# Patient Record
Sex: Female | Born: 1955 | Race: Black or African American | Hispanic: No | Marital: Single | Smoking: Never smoker
Health system: Southern US, Community
[De-identification: ages and names within clinical notes are randomized; demographics above are authoritative.]

## PROBLEM LIST (undated history)

## (undated) DIAGNOSIS — F419 Anxiety disorder, unspecified: Secondary | ICD-10-CM

## (undated) DIAGNOSIS — I5022 Chronic systolic (congestive) heart failure: Secondary | ICD-10-CM

## (undated) DIAGNOSIS — E7800 Pure hypercholesterolemia, unspecified: Secondary | ICD-10-CM

## (undated) DIAGNOSIS — I639 Cerebral infarction, unspecified: Secondary | ICD-10-CM

## (undated) DIAGNOSIS — Z9289 Personal history of other medical treatment: Secondary | ICD-10-CM

## (undated) DIAGNOSIS — I442 Atrioventricular block, complete: Secondary | ICD-10-CM

## (undated) DIAGNOSIS — G4730 Sleep apnea, unspecified: Secondary | ICD-10-CM

## (undated) DIAGNOSIS — U071 COVID-19: Secondary | ICD-10-CM

## (undated) DIAGNOSIS — E669 Obesity, unspecified: Secondary | ICD-10-CM

## (undated) DIAGNOSIS — I10 Essential (primary) hypertension: Secondary | ICD-10-CM

## (undated) DIAGNOSIS — E049 Nontoxic goiter, unspecified: Secondary | ICD-10-CM

## (undated) DIAGNOSIS — J45998 Other asthma: Secondary | ICD-10-CM

## (undated) DIAGNOSIS — Z9221 Personal history of antineoplastic chemotherapy: Secondary | ICD-10-CM

## (undated) DIAGNOSIS — Z923 Personal history of irradiation: Secondary | ICD-10-CM

## (undated) DIAGNOSIS — I255 Ischemic cardiomyopathy: Secondary | ICD-10-CM

## (undated) DIAGNOSIS — D649 Anemia, unspecified: Secondary | ICD-10-CM

## (undated) DIAGNOSIS — D497 Neoplasm of unspecified behavior of endocrine glands and other parts of nervous system: Secondary | ICD-10-CM

## (undated) DIAGNOSIS — C50411 Malignant neoplasm of upper-outer quadrant of right female breast: Secondary | ICD-10-CM

## (undated) DIAGNOSIS — D329 Benign neoplasm of meninges, unspecified: Secondary | ICD-10-CM

## (undated) DIAGNOSIS — M79606 Pain in leg, unspecified: Secondary | ICD-10-CM

## (undated) DIAGNOSIS — I2510 Atherosclerotic heart disease of native coronary artery without angina pectoris: Secondary | ICD-10-CM

## (undated) HISTORY — DX: Malignant neoplasm of upper-outer quadrant of right female breast: C50.411

## (undated) HISTORY — PX: REDUCTION MAMMAPLASTY: SUR839

## (undated) HISTORY — PX: PITUITARY SURGERY: SHX203

## (undated) HISTORY — DX: Chronic systolic (congestive) heart failure: I50.22

## (undated) HISTORY — DX: Pain in leg, unspecified: M79.606

## (undated) HISTORY — PX: BREAST LUMPECTOMY: SHX2

## (undated) HISTORY — PX: ABDOMINAL HYSTERECTOMY: SHX81

## (undated) HISTORY — DX: Other asthma: J45.998

## (undated) HISTORY — DX: Personal history of other medical treatment: Z92.89

## (undated) HISTORY — DX: Personal history of irradiation: Z92.3

## (undated) HISTORY — PX: THYROID SURGERY: SHX805

## (undated) HISTORY — PX: BREAST BIOPSY: SHX20

## (undated) SURGERY — BREAST REDUCTION WITH LIPOSUCTION
Anesthesia: General | Laterality: Bilateral

---

## 1997-12-08 ENCOUNTER — Encounter: Admission: RE | Admit: 1997-12-08 | Discharge: 1998-03-08 | Payer: Self-pay | Admitting: Family Medicine

## 2003-04-25 DIAGNOSIS — E049 Nontoxic goiter, unspecified: Secondary | ICD-10-CM

## 2003-04-25 HISTORY — DX: Nontoxic goiter, unspecified: E04.9

## 2003-04-25 HISTORY — PX: BRAIN MENINGIOMA EXCISION: SHX576

## 2008-03-06 ENCOUNTER — Encounter: Admission: RE | Admit: 2008-03-06 | Discharge: 2008-03-06 | Payer: Self-pay | Admitting: Neurosurgery

## 2008-12-16 ENCOUNTER — Other Ambulatory Visit: Admission: RE | Admit: 2008-12-16 | Discharge: 2008-12-16 | Payer: Self-pay | Admitting: Family Medicine

## 2009-02-22 ENCOUNTER — Encounter: Admission: RE | Admit: 2009-02-22 | Discharge: 2009-04-21 | Payer: Self-pay | Admitting: Family Medicine

## 2009-09-21 ENCOUNTER — Emergency Department (HOSPITAL_COMMUNITY): Admission: EM | Admit: 2009-09-21 | Discharge: 2009-09-21 | Payer: Self-pay | Admitting: Emergency Medicine

## 2010-03-27 ENCOUNTER — Inpatient Hospital Stay (HOSPITAL_COMMUNITY)
Admission: EM | Admit: 2010-03-27 | Discharge: 2010-03-29 | Payer: Self-pay | Source: Home / Self Care | Attending: Internal Medicine | Admitting: Internal Medicine

## 2010-03-28 ENCOUNTER — Encounter (INDEPENDENT_AMBULATORY_CARE_PROVIDER_SITE_OTHER): Payer: Self-pay | Admitting: Internal Medicine

## 2010-05-15 ENCOUNTER — Encounter: Payer: Self-pay | Admitting: Family Medicine

## 2010-07-04 ENCOUNTER — Emergency Department (HOSPITAL_COMMUNITY): Payer: 59

## 2010-07-04 ENCOUNTER — Emergency Department (HOSPITAL_COMMUNITY)
Admission: EM | Admit: 2010-07-04 | Discharge: 2010-07-04 | Disposition: A | Discharge status: Home / Self Care | Payer: 59 | Attending: Emergency Medicine | Admitting: Emergency Medicine

## 2010-07-04 DIAGNOSIS — Z794 Long term (current) use of insulin: Secondary | ICD-10-CM | POA: Insufficient documentation

## 2010-07-04 DIAGNOSIS — R05 Cough: Secondary | ICD-10-CM | POA: Insufficient documentation

## 2010-07-04 DIAGNOSIS — R059 Cough, unspecified: Secondary | ICD-10-CM | POA: Insufficient documentation

## 2010-07-04 DIAGNOSIS — J40 Bronchitis, not specified as acute or chronic: Secondary | ICD-10-CM | POA: Insufficient documentation

## 2010-07-04 DIAGNOSIS — I10 Essential (primary) hypertension: Secondary | ICD-10-CM | POA: Insufficient documentation

## 2010-07-04 DIAGNOSIS — E119 Type 2 diabetes mellitus without complications: Secondary | ICD-10-CM | POA: Insufficient documentation

## 2010-07-04 DIAGNOSIS — R062 Wheezing: Secondary | ICD-10-CM | POA: Insufficient documentation

## 2010-07-05 LAB — GLUCOSE, CAPILLARY
Glucose-Capillary: 177 mg/dL — ABNORMAL HIGH (ref 70–99)
Glucose-Capillary: 200 mg/dL — ABNORMAL HIGH (ref 70–99)
Glucose-Capillary: 213 mg/dL — ABNORMAL HIGH (ref 70–99)
Glucose-Capillary: 291 mg/dL — ABNORMAL HIGH (ref 70–99)

## 2010-07-05 LAB — CBC
HCT: 39.4 % (ref 36.0–46.0)
Hemoglobin: 13.7 g/dL (ref 12.0–15.0)
Hemoglobin: 13.8 g/dL (ref 12.0–15.0)
MCH: 29.8 pg (ref 26.0–34.0)
MCH: 30.1 pg (ref 26.0–34.0)
MCHC: 32.9 g/dL (ref 30.0–36.0)
MCV: 91 fL (ref 78.0–100.0)
Platelets: 286 10*3/uL (ref 150–400)
Platelets: 287 10*3/uL (ref 150–400)
RBC: 4.33 MIL/uL (ref 3.87–5.11)
RBC: 4.58 MIL/uL (ref 3.87–5.11)
RDW: 13.8 % (ref 11.5–15.5)
WBC: 8 10*3/uL (ref 4.0–10.5)
WBC: 8 10*3/uL (ref 4.0–10.5)

## 2010-07-05 LAB — COMPREHENSIVE METABOLIC PANEL
ALT: 20 U/L (ref 0–35)
AST: 17 U/L (ref 0–37)
Albumin: 3.2 g/dL — ABNORMAL LOW (ref 3.5–5.2)
Alkaline Phosphatase: 65 U/L (ref 39–117)
BUN: 11 mg/dL (ref 6–23)
CO2: 32 mEq/L (ref 19–32)
Calcium: 8.8 mg/dL (ref 8.4–10.5)
Chloride: 103 mEq/L (ref 96–112)
Creatinine, Ser: 0.86 mg/dL (ref 0.4–1.2)
GFR calc Af Amer: >60 mL/min (ref >60)
Glucose, Bld: 151 mg/dL — ABNORMAL HIGH (ref 70–99)
Potassium: 3.7 mEq/L (ref 3.5–5.1)
Sodium: 140 mEq/L (ref 135–145)
Total Bilirubin: 0.3 mg/dL (ref 0.3–1.2)
Total Protein: 6.6 g/dL (ref 6.0–8.3)

## 2010-07-05 LAB — HEPATIC FUNCTION PANEL
Alkaline Phosphatase: 66 U/L (ref 39–117)
Indirect Bilirubin: 0.1 mg/dL — ABNORMAL LOW (ref 0.3–0.9)
Total Bilirubin: 0.2 mg/dL — ABNORMAL LOW (ref 0.3–1.2)
Total Protein: 6.9 g/dL (ref 6.0–8.3)

## 2010-07-05 LAB — DIFFERENTIAL
Eosinophils Absolute: 0.3 10*3/uL (ref 0.0–0.7)
Eosinophils Absolute: 0.3 10*3/uL (ref 0.0–0.7)
Eosinophils Relative: 5 % (ref 0–5)
Lymphocytes Relative: 43 % (ref 12–46)
Lymphocytes Relative: 46 % (ref 12–46)
Lymphs Abs: 2.8 10*3/uL (ref 0.7–4.0)
Lymphs Abs: 3.5 10*3/uL (ref 0.7–4.0)
Monocytes Relative: 7 % (ref 3–12)
Monocytes Relative: 8 % (ref 3–12)
Neutro Abs: 3.6 10*3/uL (ref 1.7–7.7)
Neutrophils Relative %: 45 % (ref 43–77)

## 2010-07-05 LAB — BASIC METABOLIC PANEL
CO2: 28 mEq/L (ref 19–32)
Calcium: 8.8 mg/dL (ref 8.4–10.5)
Creatinine, Ser: 0.78 mg/dL (ref 0.4–1.2)
GFR calc Af Amer: >60 mL/min (ref >60)
GFR calc non Af Amer: >60 mL/min (ref >60)
Sodium: 135 mEq/L (ref 135–145)

## 2010-07-05 LAB — TROPONIN I: Troponin I: 0.01 ng/mL (ref 0.00–0.06)

## 2010-07-05 LAB — LIPASE, BLOOD: Lipase: 37 U/L (ref 11–59)

## 2010-07-05 LAB — POCT CARDIAC MARKERS
CKMB, poc: 1.3 ng/mL (ref 1.0–8.0)
Myoglobin, poc: 49.6 ng/mL (ref 12–200)
Myoglobin, poc: 53.8 ng/mL (ref 12–200)

## 2010-07-05 LAB — CK TOTAL AND CKMB (NOT AT ARMC)
CK, MB: 1.7 ng/mL (ref 0.3–4.0)
Relative Index: 1.6 (ref 0.0–2.5)
Total CK: 105 U/L (ref 7–177)

## 2010-07-05 LAB — CARDIAC PANEL(CRET KIN+CKTOT+MB+TROPI)
CK, MB: 1.5 ng/mL (ref 0.3–4.0)
Relative Index: 1.6 (ref 0.0–2.5)
Relative Index: INVALID (ref 0.0–2.5)
Troponin I: 0.01 ng/mL (ref 0.00–0.06)

## 2010-07-11 LAB — DIFFERENTIAL
Basophils Relative: 1 % (ref 0–1)
Lymphs Abs: 2.9 10*3/uL (ref 0.7–4.0)
Monocytes Absolute: 0.5 10*3/uL (ref 0.1–1.0)
Monocytes Relative: 7 % (ref 3–12)
Neutro Abs: 3.2 10*3/uL (ref 1.7–7.7)
Neutrophils Relative %: 46 % (ref 43–77)

## 2010-07-11 LAB — CBC
Hemoglobin: 12.8 g/dL (ref 12.0–15.0)
MCHC: 32.8 g/dL (ref 30.0–36.0)
MCV: 90.9 fL (ref 78.0–100.0)
RBC: 4.31 MIL/uL (ref 3.87–5.11)
WBC: 7 10*3/uL (ref 4.0–10.5)

## 2010-07-11 LAB — BASIC METABOLIC PANEL
CO2: 34 mEq/L — ABNORMAL HIGH (ref 19–32)
Calcium: 9.3 mg/dL (ref 8.4–10.5)
Chloride: 102 mEq/L (ref 96–112)
Creatinine, Ser: 0.66 mg/dL (ref 0.4–1.2)
GFR calc Af Amer: >60 mL/min (ref >60)
Sodium: 142 mEq/L (ref 135–145)

## 2010-07-11 LAB — POCT CARDIAC MARKERS
CKMB, poc: 1 ng/mL (ref 1.0–8.0)
CKMB, poc: <1 ng/mL — ABNORMAL LOW (ref 1.0–8.0)
Myoglobin, poc: 46.2 ng/mL (ref 12–200)
Myoglobin, poc: 73.8 ng/mL (ref 12–200)
Troponin i, poc: <0.05 ng/mL (ref 0.00–0.09)

## 2010-07-11 LAB — GLUCOSE, CAPILLARY: Glucose-Capillary: 135 mg/dL — ABNORMAL HIGH (ref 70–99)

## 2010-07-11 LAB — POCT I-STAT, CHEM 8
BUN: 12 mg/dL (ref 6–23)
Creatinine, Ser: 0.8 mg/dL (ref 0.4–1.2)
Glucose, Bld: 99 mg/dL (ref 70–99)
Potassium: 3.4 mEq/L — ABNORMAL LOW (ref 3.5–5.1)
Sodium: 141 mEq/L (ref 135–145)

## 2011-02-25 ENCOUNTER — Emergency Department (HOSPITAL_COMMUNITY): Payer: 59

## 2011-02-25 ENCOUNTER — Other Ambulatory Visit: Payer: Self-pay

## 2011-02-25 ENCOUNTER — Emergency Department (HOSPITAL_COMMUNITY)
Admission: EM | Admit: 2011-02-25 | Discharge: 2011-02-25 | Disposition: A | Discharge status: Home / Self Care | Payer: 59 | Attending: Emergency Medicine | Admitting: Emergency Medicine

## 2011-02-25 DIAGNOSIS — Z794 Long term (current) use of insulin: Secondary | ICD-10-CM | POA: Insufficient documentation

## 2011-02-25 DIAGNOSIS — F411 Generalized anxiety disorder: Secondary | ICD-10-CM | POA: Insufficient documentation

## 2011-02-25 DIAGNOSIS — Z79899 Other long term (current) drug therapy: Secondary | ICD-10-CM | POA: Insufficient documentation

## 2011-02-25 DIAGNOSIS — E785 Hyperlipidemia, unspecified: Secondary | ICD-10-CM | POA: Insufficient documentation

## 2011-02-25 DIAGNOSIS — I10 Essential (primary) hypertension: Secondary | ICD-10-CM | POA: Insufficient documentation

## 2011-02-25 DIAGNOSIS — M79609 Pain in unspecified limb: Secondary | ICD-10-CM | POA: Insufficient documentation

## 2011-02-25 DIAGNOSIS — J40 Bronchitis, not specified as acute or chronic: Secondary | ICD-10-CM | POA: Insufficient documentation

## 2011-02-25 DIAGNOSIS — R0602 Shortness of breath: Secondary | ICD-10-CM | POA: Insufficient documentation

## 2011-02-25 DIAGNOSIS — R209 Unspecified disturbances of skin sensation: Secondary | ICD-10-CM | POA: Insufficient documentation

## 2011-02-25 DIAGNOSIS — E119 Type 2 diabetes mellitus without complications: Secondary | ICD-10-CM | POA: Insufficient documentation

## 2011-02-25 LAB — COMPREHENSIVE METABOLIC PANEL
ALT: 30 U/L (ref 0–35)
Albumin: 3.5 g/dL (ref 3.5–5.2)
Calcium: 9 mg/dL (ref 8.4–10.5)
GFR calc Af Amer: >90 mL/min (ref >90)
Glucose, Bld: 96 mg/dL (ref 70–99)
Sodium: 143 mEq/L (ref 135–145)
Total Protein: 7.1 g/dL (ref 6.0–8.3)

## 2011-02-25 LAB — DIFFERENTIAL
Basophils Absolute: 0 10*3/uL (ref 0.0–0.1)
Basophils Relative: 1 % (ref 0–1)
Eosinophils Absolute: 0.3 10*3/uL (ref 0.0–0.7)
Eosinophils Relative: 5 % (ref 0–5)
Monocytes Absolute: 0.5 10*3/uL (ref 0.1–1.0)
Monocytes Relative: 7 % (ref 3–12)
Neutro Abs: 2.7 10*3/uL (ref 1.7–7.7)

## 2011-02-25 LAB — LIPASE, BLOOD: Lipase: 35 U/L (ref 11–59)

## 2011-02-25 LAB — CBC
Hemoglobin: 12.2 g/dL (ref 12.0–15.0)
MCH: 28.6 pg (ref 26.0–34.0)
MCHC: 32 g/dL (ref 30.0–36.0)
RDW: 14.2 % (ref 11.5–15.5)

## 2011-02-25 LAB — D-DIMER, QUANTITATIVE: D-Dimer, Quant: 0.38 ug/mL-FEU (ref 0.00–0.48)

## 2011-04-14 ENCOUNTER — Ambulatory Visit (INDEPENDENT_AMBULATORY_CARE_PROVIDER_SITE_OTHER): Payer: 59 | Admitting: Family Medicine

## 2011-04-14 DIAGNOSIS — IMO0001 Reserved for inherently not codable concepts without codable children: Secondary | ICD-10-CM

## 2011-04-14 DIAGNOSIS — M722 Plantar fascial fibromatosis: Secondary | ICD-10-CM

## 2011-04-28 ENCOUNTER — Other Ambulatory Visit (INDEPENDENT_AMBULATORY_CARE_PROVIDER_SITE_OTHER): Payer: 59 | Admitting: Family Medicine

## 2011-04-28 DIAGNOSIS — R05 Cough: Secondary | ICD-10-CM

## 2011-04-28 DIAGNOSIS — IMO0001 Reserved for inherently not codable concepts without codable children: Secondary | ICD-10-CM

## 2011-04-28 DIAGNOSIS — R059 Cough, unspecified: Secondary | ICD-10-CM

## 2011-04-28 DIAGNOSIS — I10 Essential (primary) hypertension: Secondary | ICD-10-CM

## 2011-04-29 ENCOUNTER — Emergency Department (HOSPITAL_COMMUNITY)
Admission: EM | Admit: 2011-04-29 | Discharge: 2011-04-29 | Disposition: A | Discharge status: Home / Self Care | Payer: 59 | Attending: Emergency Medicine | Admitting: Emergency Medicine

## 2011-04-29 ENCOUNTER — Emergency Department (HOSPITAL_COMMUNITY): Payer: 59

## 2011-04-29 DIAGNOSIS — Z7982 Long term (current) use of aspirin: Secondary | ICD-10-CM | POA: Insufficient documentation

## 2011-04-29 DIAGNOSIS — R109 Unspecified abdominal pain: Secondary | ICD-10-CM | POA: Insufficient documentation

## 2011-04-29 DIAGNOSIS — R059 Cough, unspecified: Secondary | ICD-10-CM | POA: Insufficient documentation

## 2011-04-29 DIAGNOSIS — R6889 Other general symptoms and signs: Secondary | ICD-10-CM

## 2011-04-29 DIAGNOSIS — J111 Influenza due to unidentified influenza virus with other respiratory manifestations: Secondary | ICD-10-CM | POA: Insufficient documentation

## 2011-04-29 DIAGNOSIS — R51 Headache: Secondary | ICD-10-CM | POA: Insufficient documentation

## 2011-04-29 DIAGNOSIS — I10 Essential (primary) hypertension: Secondary | ICD-10-CM | POA: Insufficient documentation

## 2011-04-29 DIAGNOSIS — R509 Fever, unspecified: Secondary | ICD-10-CM | POA: Insufficient documentation

## 2011-04-29 DIAGNOSIS — R1110 Vomiting, unspecified: Secondary | ICD-10-CM | POA: Insufficient documentation

## 2011-04-29 DIAGNOSIS — IMO0001 Reserved for inherently not codable concepts without codable children: Secondary | ICD-10-CM | POA: Insufficient documentation

## 2011-04-29 DIAGNOSIS — R0789 Other chest pain: Secondary | ICD-10-CM | POA: Insufficient documentation

## 2011-04-29 DIAGNOSIS — R05 Cough: Secondary | ICD-10-CM | POA: Insufficient documentation

## 2011-04-29 DIAGNOSIS — R000 Tachycardia, unspecified: Secondary | ICD-10-CM | POA: Insufficient documentation

## 2011-04-29 DIAGNOSIS — E119 Type 2 diabetes mellitus without complications: Secondary | ICD-10-CM | POA: Insufficient documentation

## 2011-04-29 DIAGNOSIS — R070 Pain in throat: Secondary | ICD-10-CM | POA: Insufficient documentation

## 2011-04-29 DIAGNOSIS — R5381 Other malaise: Secondary | ICD-10-CM | POA: Insufficient documentation

## 2011-04-29 DIAGNOSIS — H9209 Otalgia, unspecified ear: Secondary | ICD-10-CM | POA: Insufficient documentation

## 2011-04-29 DIAGNOSIS — J3489 Other specified disorders of nose and nasal sinuses: Secondary | ICD-10-CM | POA: Insufficient documentation

## 2011-04-29 DIAGNOSIS — R197 Diarrhea, unspecified: Secondary | ICD-10-CM | POA: Insufficient documentation

## 2011-04-29 DIAGNOSIS — Z79899 Other long term (current) drug therapy: Secondary | ICD-10-CM | POA: Insufficient documentation

## 2011-04-29 DIAGNOSIS — R5383 Other fatigue: Secondary | ICD-10-CM | POA: Insufficient documentation

## 2011-04-29 HISTORY — DX: Essential (primary) hypertension: I10

## 2011-04-29 MED ORDER — HYDROCODONE-HOMATROPINE 5-1.5 MG/5ML PO SYRP: 5.0000 mL | ORAL_SOLUTION | Freq: Four times a day (QID) | ORAL | Status: AC | PRN

## 2011-04-29 MED ORDER — BENZONATATE 100 MG PO CAPS
100.0000 mg | ORAL_CAPSULE | Freq: Once | ORAL | Status: AC
  Administered 2011-04-29: 100 mg via ORAL
  Filled 2011-04-29: qty 1

## 2011-04-29 MED ORDER — OSELTAMIVIR PHOSPHATE 75 MG PO CAPS: 75.0000 mg | ORAL_CAPSULE | Freq: Two times a day (BID) | ORAL | Status: AC

## 2011-04-29 MED ORDER — ACETAMINOPHEN 500 MG PO TABS
1000.0000 mg | ORAL_TABLET | Freq: Once | ORAL | Status: AC
  Administered 2011-04-29: 1000 mg via ORAL
  Filled 2011-04-29: qty 2

## 2011-04-29 NOTE — ED Notes (Signed)
Patient reports that she developed clear productive cough and fever yesterday, no distress on assessment, family member with same

## 2011-04-29 NOTE — ED Provider Notes (Signed)
History     CSN: [id number]  Arrival date & time 04/29/11  1511   First MD Initiated Contact with Patient 04/29/11 1754      No chief complaint on file.   (Consider location/radiation/quality/duration/timing/severity/associated sxs/prior treatment) HPI Comments: Pt presents to the ED with complaints of flu-like symptoms of cough, congestion, sore throat, muscle aches, chills, fevers, ear pain, headaches, abdominal pain, vomiting, diarrhea. The patient states that the symptoms started yesterday.  Pt has been around other sick contacts and did not get the flu shot this year. The patient denies headaches, neck pain, weakness, vision changes, severe abdominal pain, inability to eat or drink, difficulty breathing, SOB, wheezing, chest pain. The patient has tried cough medicine, NSAIDS, and rest but has only felt mild relief.  Patient denies nausea vomiting diarrhea.   The history is provided by the patient.    Past Medical History  Diagnosis Date  . Hypertension   . Diabetes mellitus     History reviewed. No pertinent past surgical history.  No family history on file.  History  Substance Use Topics  . Smoking status: Never Smoker   . Smokeless tobacco: Not on file  . Alcohol Use:     OB History    Grav Para Term Preterm Abortions TAB SAB Ect Mult Living                  Review of Systems  Constitutional: Positive for fever, chills and fatigue.  HENT: Negative for ear pain, congestion, sore throat, rhinorrhea, sneezing, neck pain, neck stiffness, sinus pressure and tinnitus.   Eyes: Negative for visual disturbance.  Respiratory: Positive for cough and chest tightness.   Cardiovascular: Negative for chest pain and palpitations.  Gastrointestinal: Negative for nausea, vomiting, abdominal pain and diarrhea.  Genitourinary: Negative for dysuria.  Musculoskeletal: Positive for myalgias.  Skin: Negative for color change and rash.  Neurological: Negative for dizziness and  weakness.  Hematological: Does not bruise/bleed easily.  Psychiatric/Behavioral: Negative for confusion.  All other systems reviewed and are negative.    Allergies  Review of patient's allergies indicates no known allergies.  Home Medications   Current Outpatient Rx  Name Route Sig Dispense Refill  . ACETAMINOPHEN 500 MG PO TABS Oral Take 500 mg by mouth every 6 (six) hours as needed. For pain      . ASPIRIN 81 MG PO TABS Oral Take 320 mg by mouth 1 day or 1 dose.      Marland Kitchen OMEGA-3 FATTY ACIDS 1000 MG PO CAPS Oral Take 2 g by mouth daily.      . GUAIFENESIN-DM 100-10 MG/5ML PO SYRP Oral Take 5 mLs by mouth 3 (three) times daily as needed. Chest congestion and coughing     . INSULIN GLARGINE 100 UNIT/ML [place] SOLN Subcutaneous Inject 35 Units into the skin at bedtime.      Marland Kitchen LISINOPRIL-HYDROCHLOROTHIAZIDE 10-12.5 MG PO TABS Oral Take 1 tablet by mouth daily.      Marland Kitchen METFORMIN HCL ER 750 MG PO TB24 Oral Take 750 mg by mouth daily with breakfast.      . ADULT MULTIVITAMIN W/MINERALS CH Oral Take 1 tablet by mouth daily.      Marland Kitchen PSEUDOEPHEDRINE-APAP-DM 40-981-19 MG/30ML PO LIQD Oral Take 15 mLs by mouth 2 (two) times daily between meals as needed. For fever    . ROSUVASTATIN CALCIUM 10 MG PO TABS Oral Take 10 mg by mouth daily.      . VENLAFAXINE HCL ER 150 MG PO  CP24 Oral Take 150 mg by mouth daily.        BP 146/78  Pulse 124  Temp(Src) 101.7 F (38.7 C) (Oral)  Resp 20  SpO2 95%  Physical Exam  Nursing note and vitals reviewed. Constitutional: She is oriented to person, place, and time. She appears well-developed and well-nourished. No distress.       Patient is febrile with a temperature of 101.7.  Likely tachycardic for this reason.  HENT:  Head: Normocephalic and atraumatic.  Right Ear: External ear normal.  Left Ear: External ear normal.  Nose: Nose normal.  Mouth/Throat: Oropharynx is clear and moist. No oropharyngeal exudate.  Eyes: EOM are normal.  Neck: Normal range of  motion. Neck supple.  Cardiovascular: Regular rhythm and normal heart sounds.  Tachycardia present.   Pulmonary/Chest: Effort normal and breath sounds normal. No accessory muscle usage or stridor. Not tachypneic. No respiratory distress. She has no wheezes. She has no rhonchi. She has no rales. She exhibits tenderness.  Abdominal: Soft. She exhibits no distension. There is no tenderness.  Musculoskeletal: Normal range of motion.  Lymphadenopathy:    She has no cervical adenopathy.  Neurological: She is alert and oriented to person, place, and time.  Skin: Skin is warm and dry. She is not diaphoretic.    ED Course  Procedures (including critical care time)  Labs Reviewed - No data to display Dg Chest 2 View  04/29/2011  *RADIOLOGY REPORT*  Clinical Data: Fever, cough  CHEST - 2 VIEW  Comparison: 02/25/2011  Findings: Mild left basilar atelectasis. No pleural effusion or pneumothorax.  Cardiomediastinal silhouette is within normal limits.  Visualized osseous structures are within normal limits.  IMPRESSION: No evidence of acute cardiopulmonary disease.  Mild left basilar atelectasis.  Original Report Authenticated By: Charline Bills, M.D.     No diagnosis found.    MDM  Flu like s/s  Patient presents with flulike symptoms.  Due to patient's presentation and physical exam neg chest the likely diagnosis is flu.  Discussed the cost versus benefit of Tamiflu treatment with the patient.  The patient will be given Tamiflu because her symptoms are less than 24 hours and she has significant comorbidities including diabetes and hypertension.   Patient will be discharged with instructions to orally hydrate, rest, and use over-the-counter medications such as anti-inflammatories ibuprofen and Aleve for muscle aches and Tylenol for fever.  Patient will also be given a cough suppressant.         [place], [place] 04/29/11 1856  [place], [place] 04/29/11 1859

## 2011-04-30 NOTE — ED Provider Notes (Signed)
Medical screening examination/treatment/procedure(s) were performed by non-physician practitioner and as supervising physician I was immediately available for consultation/collaboration.  Cherlyn Syring P Nychelle Cassata, MD 04/30/11 0021 

## 2011-05-25 ENCOUNTER — Telehealth: Payer: Self-pay

## 2011-05-25 DIAGNOSIS — I10 Essential (primary) hypertension: Secondary | ICD-10-CM
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
## 2011-05-26 ENCOUNTER — Ambulatory Visit: Payer: 59 | Admitting: *Deleted

## 2011-05-27 MED ORDER — HYDROCHLOROTHIAZIDE 25 MG PO TABS: 25.0000 mg | ORAL_TABLET | Freq: Every day | ORAL | Status: DC

## 2011-05-27 NOTE — Telephone Encounter (Signed)
Call pt - stop losartan/hct.  Start HCTZ 25mg  1 po qd #30, no rf and recheck next 2 weeks.  Check home blood pressures. If cough increasing or any swelling or shortness of breath, needs to immediately rtc or to er.

## 2011-05-27 NOTE — Telephone Encounter (Signed)
Called patient and gave new directions per Dr Neva Seat. States understanding.

## 2011-06-26 ENCOUNTER — Ambulatory Visit (INDEPENDENT_AMBULATORY_CARE_PROVIDER_SITE_OTHER): Payer: 59 | Admitting: Family Medicine

## 2011-06-26 ENCOUNTER — Emergency Department (HOSPITAL_COMMUNITY): Admission: EM | Admit: 2011-06-26 | Discharge: 2011-06-26 | Discharge status: Against Medical Advice | Payer: 59

## 2011-06-26 VITALS — BP 156/92 | HR 108 | Temp 98.2°F | Resp 16 | Ht 71.0 in | Wt 254.0 lb

## 2011-06-26 DIAGNOSIS — M545 Low back pain, unspecified: Secondary | ICD-10-CM

## 2011-06-26 DIAGNOSIS — I10 Essential (primary) hypertension: Secondary | ICD-10-CM

## 2011-06-26 LAB — POCT UA - MICROSCOPIC ONLY
Bacteria, U Microscopic: NEGATIVE
Crystals, Ur, HPF, POC: NEGATIVE

## 2011-06-26 LAB — POCT URINALYSIS DIPSTICK
Bilirubin, UA: NEGATIVE
Glucose, UA: NEGATIVE
Leukocytes, UA: NEGATIVE
Nitrite, UA: NEGATIVE

## 2011-06-26 MED ORDER — LOSARTAN POTASSIUM-HCTZ 100-12.5 MG PO TABS: 1.0000 | ORAL_TABLET | Freq: Every day | ORAL | Status: DC

## 2011-06-26 NOTE — ED Notes (Signed)
UNABLE TO LOCATE PT. AT TRIAGE SEVERAL TIMES.  

## 2011-06-26 NOTE — ED Notes (Signed)
Called for pt, no answer

## 2011-06-26 NOTE — Progress Notes (Signed)
Subjective:    Patient ID: Destiny Beasley, female    DOB: 11-Jul-1955, 56 y.o.   MRN: [id number]  HPI Destiny Beasley is a 56 y.o. female Hx of DM and HTN,  Noted BP 178/101 at [facility] earlier today, then 160/100.  Prior to today, running in 140's - 150's .  Last ov 04/28/11  changed the combo lisinopril/hctz to losartan 50/12.5 due to cough. Creat 0.85 on 04/28/11.  No missed doses.  Had switched to HCTZ 25mg  over phone as thought cough was still there, however, ran out of HCTZ 25mg  1 week ago, restarted losartan hct 50/12.5 for past week, and has not had a cough.  Low back pain today. - middle low back, no radiation to legs. No bowel/bladder dysfunction, no saddle anesthesia.  NKI, No dysuria, or other new urinary symptoms. Tx:   None.  Review of Systems  Constitutional: Negative for fever and chills.  Eyes: Negative for visual disturbance.  Respiratory: Negative for chest tightness and shortness of breath.   Cardiovascular: Negative for chest pain, palpitations and leg swelling.  Gastrointestinal: Negative for abdominal pain.  Genitourinary: Negative for dysuria, urgency, frequency, decreased urine volume and difficulty urinating.  Musculoskeletal: Positive for back pain.  Skin: Negative for rash.  Neurological: Negative for dizziness, light-headedness, numbness and headaches.       Objective:   Physical Exam  Constitutional: She is oriented to person, place, and time. She appears well-developed and well-nourished.  HENT:  Head: Normocephalic and atraumatic.  Eyes: Conjunctivae are normal. Pupils are equal, round, and reactive to light.  Neck: Normal range of motion.  Cardiovascular: Normal rate, regular rhythm, normal heart sounds and intact distal pulses.   No murmur heard. Pulmonary/Chest: Effort normal and breath sounds normal. No respiratory distress.  Abdominal: Soft. There is no tenderness. There is no CVA tenderness.  Musculoskeletal: Normal range of motion.       Lumbar back:  She exhibits tenderness. She exhibits normal range of motion, no bony tenderness and no edema.       Good rom, negative slr, able to toe and heel walk, no cvat.  Min ttp at lumbar spine.  Neurological: She is alert and oriented to person, place, and time.  Skin: Skin is warm and dry. No rash noted.  Psychiatric: She has a normal mood and affect. Her behavior is normal.   Results for orders placed in visit on 06/26/11  POCT URINALYSIS DIPSTICK      Component Value Range   Color, UA yellow     Clarity, UA clear     Glucose, UA neg     Bilirubin, UA neg     Ketones, UA neg     Spec Grav, UA 1.015     Blood, UA trace-intact     pH, UA 7.0     Protein, UA neg     Urobilinogen, UA 1.0     Nitrite, UA neg     Leukocytes, UA Negative    POCT UA - MICROSCOPIC ONLY      Component Value Range   WBC, Ur, HPF, POC 0-1     RBC, urine, microscopic 0-1     Bacteria, U Microscopic neg     Mucus, UA neg     Epithelial cells, urine per micros 0-1     Crystals, Ur, HPF, POC neg     Casts, Ur, LPF, POC neg     Yeast, UA neg  Assessment & Plan:  Destiny Beasley is a 56 y.o. female 1. Low back pain  POCT Urinalysis Dipstick, POCT UA - Microscopic Only  2. HTN (hypertension)     Suspect LBP msk source/deconditioning.  Tylenol otc, heat, ROM prn. Back care manual.  RTC precautions.  After discussion, clarified actaul meds taken - changed on med list.  Past 1 week on losartan hct.  Will Increase losartan hct to 100/12/5 qd, as no current cough.  Monitor outside BP's and recheck in the next 3-4 weeks.

## 2011-06-30 ENCOUNTER — Ambulatory Visit: Payer: 59 | Admitting: Family Medicine

## 2011-07-03 ENCOUNTER — Encounter (INDEPENDENT_AMBULATORY_CARE_PROVIDER_SITE_OTHER): Payer: 59 | Admitting: Obstetrics and Gynecology

## 2011-07-03 DIAGNOSIS — N951 Menopausal and female climacteric states: Secondary | ICD-10-CM

## 2011-07-13 ENCOUNTER — Ambulatory Visit (INDEPENDENT_AMBULATORY_CARE_PROVIDER_SITE_OTHER): Payer: 59 | Admitting: Family Medicine

## 2011-07-13 VITALS — BP 146/86 | HR 108 | Temp 98.3°F | Resp 16 | Ht 71.5 in | Wt 253.0 lb

## 2011-07-13 DIAGNOSIS — H5789 Other specified disorders of eye and adnexa: Secondary | ICD-10-CM

## 2011-07-13 DIAGNOSIS — H547 Unspecified visual loss: Secondary | ICD-10-CM

## 2011-07-13 NOTE — Progress Notes (Signed)
  Subjective:    Patient ID: Destiny Beasley, female    DOB: 09-11-1955, 56 y.o.   MRN: [id number]  HPI 56 yo female with right eye complaint. Woke up Monday and it was red.  No itching.  Had "film" and felt like something was in it.  Will improve gradually through the day and then very red again in the morning.  Today it is now painful.  Still feels like something is in it.  Has not put anything in it, has wiped it with wash cloth.  Today started to feel like she couldn't see as well out of it.  No history of problems with it.  Does wear glasses.  Optometrist is Dr. Harriette Beasley.  No fevers.  Doesn't feel badly.  No headaches.  Insulin-dependent diabetic and has noticed sugars have been worse this week.     Review of Systems Negative except as per HPI     Objective:   Physical Exam  Constitutional: She appears well-developed.  Eyes: EOM are normal. Pupils are equal, round, and reactive to light. No foreign bodies found. Right eye exhibits discharge. Right conjunctiva is injected.  Pulmonary/Chest: Effort normal.  Neurological: She is alert.   Pupils equal, though both sluggishly reactive.  No pain to palpation around eye.  Lids not erythematous.  Has difficult keeping right eye open more than a small amount.  Clear drainage, no pus-like discharge.  EOM normal.  Fundoscopic exam difficult - hard for patient to keep eye open and unable to visualize discs.  Some darkness/shadowing and cataract.  Able to see left eye, even with cataract.        Assessment & Plan:  Red, uncomfortable eye for 4 days with acute loss of vision today - possible retinal bleed.  Appt now with Dr. Ephriam Beasley office.  Patient informed and given directions.  Will head right there.

## 2011-09-18 ENCOUNTER — Ambulatory Visit (INDEPENDENT_AMBULATORY_CARE_PROVIDER_SITE_OTHER): Payer: 59 | Admitting: Family Medicine

## 2011-09-18 ENCOUNTER — Ambulatory Visit: Payer: 59

## 2011-09-18 VITALS — BP 144/89 | HR 114 | Temp 98.2°F | Resp 24 | Ht 71.0 in | Wt 257.4 lb

## 2011-09-18 DIAGNOSIS — R079 Chest pain, unspecified: Secondary | ICD-10-CM

## 2011-09-18 DIAGNOSIS — M542 Cervicalgia: Secondary | ICD-10-CM

## 2011-09-18 DIAGNOSIS — M549 Dorsalgia, unspecified: Secondary | ICD-10-CM

## 2011-09-18 MED ORDER — CYCLOBENZAPRINE HCL 10 MG PO TABS: 10.0000 mg | ORAL_TABLET | Freq: Three times a day (TID) | ORAL | Status: AC | PRN
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
## 2011-10-05 ENCOUNTER — Other Ambulatory Visit: Payer: Self-pay | Admitting: Occupational Medicine

## 2011-10-05 ENCOUNTER — Ambulatory Visit: Payer: Self-pay

## 2011-10-05 DIAGNOSIS — M549 Dorsalgia, unspecified: Secondary | ICD-10-CM

## 2011-10-16 ENCOUNTER — Other Ambulatory Visit: Payer: Self-pay | Admitting: Neurosurgery

## 2011-10-16 ENCOUNTER — Other Ambulatory Visit: Payer: Self-pay | Admitting: Obstetrics and Gynecology

## 2011-10-16 DIAGNOSIS — R519 Headache, unspecified: Secondary | ICD-10-CM

## 2011-10-19 ENCOUNTER — Ambulatory Visit
Admission: RE | Admit: 2011-10-19 | Discharge: 2011-10-19 | Disposition: A | Discharge status: Home / Self Care | Payer: Worker's Compensation | Source: Ambulatory Visit | Attending: Neurosurgery | Admitting: Neurosurgery

## 2011-10-19 DIAGNOSIS — R519 Headache, unspecified: Secondary | ICD-10-CM

## 2011-10-21 ENCOUNTER — Other Ambulatory Visit: Payer: Self-pay | Admitting: Family Medicine

## 2011-10-22 ENCOUNTER — Other Ambulatory Visit: Payer: Self-pay | Admitting: Obstetrics and Gynecology

## 2011-10-28 ENCOUNTER — Other Ambulatory Visit: Payer: Self-pay | Admitting: Family Medicine

## 2011-11-11 ENCOUNTER — Encounter (HOSPITAL_COMMUNITY): Payer: Self-pay | Admitting: *Deleted

## 2011-11-11 ENCOUNTER — Observation Stay (HOSPITAL_COMMUNITY)
Admission: EM | Admit: 2011-11-11 | Discharge: 2011-11-12 | Disposition: A | Discharge status: Home / Self Care | Payer: 59 | Attending: Internal Medicine | Admitting: Internal Medicine

## 2011-11-11 ENCOUNTER — Emergency Department (HOSPITAL_COMMUNITY): Payer: 59

## 2011-11-11 DIAGNOSIS — R0789 Other chest pain: Secondary | ICD-10-CM

## 2011-11-11 DIAGNOSIS — IMO0001 Reserved for inherently not codable concepts without codable children: Secondary | ICD-10-CM | POA: Diagnosis present

## 2011-11-11 DIAGNOSIS — Z7902 Long term (current) use of antithrombotics/antiplatelets: Secondary | ICD-10-CM | POA: Insufficient documentation

## 2011-11-11 DIAGNOSIS — E119 Type 2 diabetes mellitus without complications: Secondary | ICD-10-CM

## 2011-11-11 DIAGNOSIS — I959 Hypotension, unspecified: Principal | ICD-10-CM

## 2011-11-11 DIAGNOSIS — E7800 Pure hypercholesterolemia, unspecified: Secondary | ICD-10-CM | POA: Diagnosis present

## 2011-11-11 DIAGNOSIS — E785 Hyperlipidemia, unspecified: Secondary | ICD-10-CM

## 2011-11-11 DIAGNOSIS — R0602 Shortness of breath: Secondary | ICD-10-CM | POA: Insufficient documentation

## 2011-11-11 DIAGNOSIS — F411 Generalized anxiety disorder: Secondary | ICD-10-CM

## 2011-11-11 DIAGNOSIS — F419 Anxiety disorder, unspecified: Secondary | ICD-10-CM | POA: Diagnosis present

## 2011-11-11 HISTORY — DX: Pure hypercholesterolemia, unspecified: E78.00

## 2011-11-11 HISTORY — DX: Anxiety disorder, unspecified: F41.9

## 2011-11-11 LAB — URINALYSIS, ROUTINE W REFLEX MICROSCOPIC
Nitrite: NEGATIVE
Protein, ur: NEGATIVE mg/dL
Specific Gravity, Urine: 1.029 (ref 1.005–1.030)
Urobilinogen, UA: 1 mg/dL (ref 0.0–1.0)

## 2011-11-11 LAB — CBC
HCT: 40.7 % (ref 36.0–46.0)
HCT: 41 % (ref 36.0–46.0)
Hemoglobin: 13.7 g/dL (ref 12.0–15.0)
Hemoglobin: 13.8 g/dL (ref 12.0–15.0)
MCH: 29.4 pg (ref 26.0–34.0)
MCV: 87.3 fL (ref 78.0–100.0)
MCV: 87.4 fL (ref 78.0–100.0)
RBC: 4.66 MIL/uL (ref 3.87–5.11)
RBC: 4.69 MIL/uL (ref 3.87–5.11)
WBC: 9.1 10*3/uL (ref 4.0–10.5)

## 2011-11-11 LAB — CARDIAC PANEL(CRET KIN+CKTOT+MB+TROPI)
CK, MB: 3.2 ng/mL (ref 0.3–4.0)
CK, MB: 3.1 ng/mL (ref 0.3–4.0)
CK, MB: 3.2 ng/mL (ref 0.3–4.0)
Relative Index: 1.6 (ref 0.0–2.5)
Relative Index: 1.3 (ref 0.0–2.5)
Total CK: 203 U/L — ABNORMAL HIGH (ref 7–177)
Total CK: 214 U/L — ABNORMAL HIGH (ref 7–177)
Total CK: 251 U/L — ABNORMAL HIGH (ref 7–177)
Troponin I: <0.3 ng/mL (ref <0.30)
Troponin I: <0.3 ng/mL (ref <0.30)

## 2011-11-11 LAB — POCT I-STAT, CHEM 8
BUN: 21 mg/dL (ref 6–23)
Calcium, Ion: 1.26 mmol/L — ABNORMAL HIGH (ref 1.12–1.23)
Creatinine, Ser: 1 mg/dL (ref 0.50–1.10)
Hemoglobin: 15 g/dL (ref 12.0–15.0)
Sodium: 142 mEq/L (ref 135–145)
TCO2: 28 mmol/L (ref 0–100)

## 2011-11-11 LAB — CORTISOL: Cortisol, Plasma: 9.6 ug/dL

## 2011-11-11 LAB — COMPREHENSIVE METABOLIC PANEL
BUN: 21 mg/dL (ref 6–23)
CO2: 29 mEq/L (ref 19–32)
Calcium: 9.5 mg/dL (ref 8.4–10.5)
Chloride: 101 mEq/L (ref 96–112)
Creatinine, Ser: 1.04 mg/dL (ref 0.50–1.10)
GFR calc Af Amer: 70 mL/min — ABNORMAL LOW (ref >90)
GFR calc non Af Amer: 60 mL/min — ABNORMAL LOW (ref >90)
Glucose, Bld: 193 mg/dL — ABNORMAL HIGH (ref 70–99)
Total Bilirubin: 0.2 mg/dL — ABNORMAL LOW (ref 0.3–1.2)

## 2011-11-11 LAB — HEMOGLOBIN A1C
Hgb A1c MFr Bld: 10.9 % — ABNORMAL HIGH (ref <5.7)
Mean Plasma Glucose: 266 mg/dL — ABNORMAL HIGH (ref <117)

## 2011-11-11 LAB — GLUCOSE, CAPILLARY
Glucose-Capillary: 252 mg/dL — ABNORMAL HIGH (ref 70–99)
Glucose-Capillary: 343 mg/dL — ABNORMAL HIGH (ref 70–99)
Glucose-Capillary: 328 mg/dL — ABNORMAL HIGH (ref 70–99)

## 2011-11-11 LAB — CREATININE, SERUM: Creatinine, Ser: 0.91 mg/dL (ref 0.50–1.10)

## 2011-11-11 LAB — POCT I-STAT TROPONIN I

## 2011-11-11 MED ORDER — HYDROCODONE-ACETAMINOPHEN 5-325 MG PO TABS: 1.0000 | ORAL_TABLET | ORAL | Status: DC | PRN

## 2011-11-11 MED ORDER — SODIUM CHLORIDE 0.9 % IV SOLN: INTRAVENOUS | Status: DC

## 2011-11-11 MED ORDER — ALUM & MAG HYDROXIDE-SIMETH 200-200-20 MG/5ML PO SUSP: 30.0000 mL | Freq: Four times a day (QID) | ORAL | Status: DC | PRN

## 2011-11-11 MED ORDER — SODIUM CHLORIDE 0.9 % IJ SOLN: 3.0000 mL | Freq: Two times a day (BID) | INTRAMUSCULAR | Status: DC

## 2011-11-11 MED ORDER — INSULIN ASPART 100 UNIT/ML ~~LOC~~ SOLN
0.0000 [IU] | Freq: Three times a day (TID) | SUBCUTANEOUS | Status: DC
  Administered 2011-11-11: 19:00:00 via SUBCUTANEOUS
  Administered 2011-11-11: 8 [IU] via SUBCUTANEOUS
  Administered 2011-11-12: 5 [IU] via SUBCUTANEOUS

## 2011-11-11 MED ORDER — ONDANSETRON HCL 4 MG/2ML IJ SOLN: 4.0000 mg | Freq: Four times a day (QID) | INTRAMUSCULAR | Status: DC | PRN

## 2011-11-11 MED ORDER — VENLAFAXINE HCL ER 150 MG PO CP24
150.0000 mg | ORAL_CAPSULE | Freq: Every day | ORAL | Status: DC
  Administered 2011-11-11 – 2011-11-12 (×2): 150 mg via ORAL
  Filled 2011-11-11 (×2): qty 1

## 2011-11-11 MED ORDER — HYDROMORPHONE HCL PF 1 MG/ML IJ SOLN: 1.0000 mg | INTRAMUSCULAR | Status: DC | PRN

## 2011-11-11 MED ORDER — ENOXAPARIN SODIUM 40 MG/0.4ML ~~LOC~~ SOLN
40.0000 mg | SUBCUTANEOUS | Status: DC
  Administered 2011-11-11 – 2011-11-12 (×2): 40 mg via SUBCUTANEOUS
  Filled 2011-11-11 (×2): qty 0.4

## 2011-11-11 MED ORDER — INSULIN ASPART 100 UNIT/ML ~~LOC~~ SOLN
0.0000 [IU] | Freq: Every day | SUBCUTANEOUS | Status: DC
  Administered 2011-11-11: 4 [IU] via SUBCUTANEOUS

## 2011-11-11 MED ORDER — ACETAMINOPHEN 325 MG PO TABS: 650.0000 mg | ORAL_TABLET | Freq: Four times a day (QID) | ORAL | Status: DC | PRN

## 2011-11-11 MED ORDER — ACETAMINOPHEN 650 MG RE SUPP: 650.0000 mg | Freq: Four times a day (QID) | RECTAL | Status: DC | PRN

## 2011-11-11 MED ORDER — PANTOPRAZOLE SODIUM 40 MG PO TBEC
40.0000 mg | DELAYED_RELEASE_TABLET | Freq: Every day | ORAL | Status: DC
  Administered 2011-11-11 – 2011-11-12 (×2): 40 mg via ORAL
  Filled 2011-11-11 (×2): qty 1

## 2011-11-11 MED ORDER — ASPIRIN 325 MG PO TABS
325.0000 mg | ORAL_TABLET | Freq: Every day | ORAL | Status: DC
  Administered 2011-11-11 – 2011-11-12 (×2): 325 mg via ORAL
  Filled 2011-11-11 (×2): qty 1

## 2011-11-11 MED ORDER — ASPIRIN 81 MG PO TABS
320.0000 mg | ORAL_TABLET | Freq: Every day | ORAL | Status: DC
Start: 2011-11-11 — End: 2011-11-11

## 2011-11-11 MED ORDER — ADULT MULTIVITAMIN W/MINERALS CH
1.0000 | ORAL_TABLET | Freq: Every day | ORAL | Status: DC
  Administered 2011-11-11 – 2011-11-12 (×2): 1 via ORAL
  Filled 2011-11-11 (×2): qty 1

## 2011-11-11 MED ORDER — ATORVASTATIN CALCIUM 20 MG PO TABS
20.0000 mg | ORAL_TABLET | Freq: Every day | ORAL | Status: DC
  Administered 2011-11-11: 20 mg via ORAL
  Filled 2011-11-11 (×2): qty 1

## 2011-11-11 MED ORDER — ONDANSETRON HCL 4 MG PO TABS: 4.0000 mg | ORAL_TABLET | Freq: Four times a day (QID) | ORAL | Status: DC | PRN

## 2011-11-11 MED ORDER — INSULIN GLARGINE 100 UNIT/ML ~~LOC~~ SOLN
35.0000 [IU] | Freq: Every day | SUBCUTANEOUS | Status: DC
  Administered 2011-11-11: 35 [IU] via SUBCUTANEOUS

## 2011-11-11 NOTE — ED Provider Notes (Signed)
History     CSN: [id number]  Arrival date & time 11/11/11  0447   First MD Initiated Contact with Patient 11/11/11 [phone]      Chief Complaint  Patient presents with  . Near Syncope    (Consider location/radiation/quality/duration/timing/severity/associated sxs/prior treatment) HPI Hx per PT, followed by [facility] clinic. Woke with leg cramps and developed near syncope, CP amd SOB. Lasted about 30 mins now resolved. ASA PTA. BIB EMS. No symptoms now, no h/o same. Mod in severity, no known alleviating factors Past Medical History  Diagnosis Date  . Hypertension   . Diabetes mellitus   . Anxiety   . Hypercholesteremia     History reviewed. No pertinent past surgical history.  History reviewed. No pertinent family history.  History  Substance Use Topics  . Smoking status: Never Smoker   . Smokeless tobacco: Not on file  . Alcohol Use:     OB History    Grav Para Term Preterm Abortions TAB SAB Ect Mult Living                  Review of Systems  Constitutional: Negative for fever and chills.  HENT: Negative for neck pain and neck stiffness.   Eyes: Negative for pain.  Respiratory: Positive for shortness of breath.   Cardiovascular: Positive for chest pain.  Gastrointestinal: Negative for abdominal pain.  Genitourinary: Negative for dysuria.  Musculoskeletal: Negative for back pain.  Skin: Negative for rash.  Neurological: Negative for headaches.  All other systems reviewed and are negative.    Allergies  Review of patient's allergies indicates no known allergies.  Home Medications   Current Outpatient Rx  Name Route Sig Dispense Refill  . ALISKIREN FUMARATE 300 MG PO TABS Oral Take 300 mg by mouth daily.    . ASPIRIN 81 MG PO TABS Oral Take 320 mg by mouth daily.     Marland Kitchen EST ESTROGENS-METHYLTEST 0.625-1.25 MG PO TABS Oral Take 1 tablet by mouth daily.    . INSULIN GLARGINE 100 UNIT/ML [place] SOLN Subcutaneous Inject 35 Units into the skin at bedtime.      . INSULIN  GLULISINE 100 UNIT/ML [place] SOLN Subcutaneous Inject into the skin 3 (three) times daily before meals.    . ADULT MULTIVITAMIN W/MINERALS CH Oral Take 1 tablet by mouth daily.      Marland Kitchen ROSUVASTATIN CALCIUM 10 MG PO TABS Oral Take 10 mg by mouth daily.      . VENLAFAXINE HCL ER 150 MG PO CP24 Oral Take 150 mg by mouth daily.        BP 124/79  Pulse 91  Temp 97.8 F (36.6 C) (Oral)  Resp 16  SpO2 93%  Physical Exam  Constitutional: She is oriented to person, place, and time. She appears well-developed and well-nourished.  HENT:  Head: Normocephalic and atraumatic.  Eyes: Conjunctivae and EOM are normal. Pupils are equal, round, and reactive to light.  Neck: Trachea normal. Neck supple. No thyromegaly present.  Cardiovascular: Normal rate, regular rhythm, S1 normal, S2 normal and normal pulses.     No systolic murmur is present   No diastolic murmur is present  Pulses:      Radial pulses are 2+ on the right side, and 2+ on the left side.  Pulmonary/Chest: Effort normal and breath sounds normal. She has no wheezes. She has no rhonchi. She has no rales. She exhibits no tenderness.  Abdominal: Soft. Normal appearance and bowel sounds are normal. There is no tenderness. There is  no CVA tenderness and negative Murphy's sign.  Musculoskeletal:       BLE:s Calves nontender, no cords or erythema, negative Homans sign  Neurological: She is alert and oriented to person, place, and time. She has normal strength. No cranial nerve deficit or sensory deficit. GCS eye subscore is 4. GCS verbal subscore is 5. GCS motor subscore is 6.  Skin: Skin is warm and dry. No rash noted. She is not diaphoretic.  Psychiatric: Her speech is normal.       Cooperative and appropriate    ED Course  Procedures (including critical care time)  Results for orders placed during the hospital encounter of 11/11/11  CBC      Component Value Range   WBC 8.9  4.0 - 10.5 K/uL   RBC 4.66  3.87 - 5.11 MIL/uL   Hemoglobin 13.7   12.0 - 15.0 g/dL   HCT 16.1  09.6 - 04.5 %   MCV 87.3  78.0 - 100.0 fL   MCH 29.4  26.0 - 34.0 pg   MCHC 33.7  30.0 - 36.0 g/dL   RDW 40.9  81.1 - 91.4 %   Platelets 285  150 - 400 K/uL  POCT I-STAT, CHEM 8      Component Value Range   Sodium 142  135 - 145 mEq/L   Potassium 3.6  3.5 - 5.1 mEq/L   Chloride 102  96 - 112 mEq/L   BUN 21  6 - 23 mg/dL   Creatinine, Ser 7.82  0.50 - 1.10 mg/dL   Glucose, Bld 956 (*) 70 - 99 mg/dL   Calcium, Ion 2.13 (*) 1.12 - 1.23 mmol/L   TCO2 28  0 - 100 mmol/L   Hemoglobin 15.0  12.0 - 15.0 g/dL   HCT 08.6  57.8 - 46.9 %  POCT I-STAT TROPONIN I      Component Value Range   Troponin i, poc 0.01  0.00 - 0.08 ng/mL   Comment 3              Date: 11/11/2011  Rate: 78  Rhythm: normal sinus rhythm  QRS Axis: normal  Intervals: PR prolonged  ST/T Wave abnormalities: nonspecific ST changes  Conduction Disutrbances:first-degree A-V block   Narrative Interpretation:   Old EKG Reviewed: none available  7:41 AM d/w Dr Isidoro Donning, will eval in the Ed for admit.     MDM   CP and near syncope, work up as above. Plan MED admit        Sunnie Nielsen, MD 11/11/11 [phone]

## 2011-11-11 NOTE — H&P (Signed)
History and Physical       Hospital Admission Note Date: 11/11/2011  Patient name: Destiny Beasley Medical record number: [id number] Date of birth: 1955-07-29 Age: 56 y.o. Gender: female PCP: Janine Limbo, MD    Chief Complaint:  Atypical chest pain with transient shortness of breath this morning, resolved  HPI: Patient is a 56 year old female who [facility] with history of hypertension, diabetes coming Friday, hyperlipidemia presented to ED with complaints of atypical chest pain. Patient states that she woke up with leg cramps this morning, then felt dizzy lightheaded, then developed chest pain. She stated that chest pain was midsternal, with no radiation, palpitations or diaphoresis, no numbness or tingling. She did have some shortness of breath however all symptoms resolved in 30 minutes. Patient called EMS and was noted to be hypotensive with systolic blood pressure in 80's.   Review of Systems:  Constitutional: Denies fever, chills, diaphoresis, appetite change and fatigue.  HEENT: Denies photophobia, eye pain, redness, hearing loss, ear pain, congestion, sore throat, rhinorrhea, sneezing, mouth sores, trouble swallowing, neck pain, neck stiffness and tinnitus.   Respiratory: Please see history of present illness  Cardiovascular: Please see history of present illness Gastrointestinal: Denies nausea, vomiting, abdominal pain, diarrhea, constipation, blood in stool and abdominal distention.  Genitourinary: Denies dysuria, urgency, frequency, hematuria, flank pain and difficulty urinating.  Musculoskeletal: Denies myalgias, back pain, joint swelling, arthralgias and gait problem.  Skin: Denies pallor, rash and wound.  Neurological: + Transient dizziness with lightheadedness, denies seizures, syncope, weakness, numbness and headaches.  Hematological: Denies adenopathy. Easy bruising, personal or family bleeding  history  Psychiatric/Behavioral: Denies suicidal ideation, mood changes, confusion, nervousness, sleep disturbance and agitation  Past Medical History: Past Medical History  Diagnosis Date  . Hypertension   . Diabetes mellitus   . Anxiety   . Hypercholesteremia    Past surgical history:  Patient had pituitary tumor removed in 1988 Meningioma removed in 2005 Vital removed in 2008  Medications: Prior to Admission medications   Medication Sig Start Date End Date Taking? Authorizing Provider  aliskiren (TEKTURNA) 300 MG tablet Take 300 mg by mouth daily.   Yes Historical Provider, MD  aspirin 81 MG tablet Take 320 mg by mouth daily.    Yes Historical Provider, MD  estrogen-methylTESTOSTERone (ESTRATEST HS) 0.625-1.25 MG per tablet Take 1 tablet by mouth daily.   Yes Historical Provider, MD  insulin glargine (LANTUS) 100 UNIT/ML injection Inject 35 Units into the skin at bedtime.     Yes Historical Provider, MD  insulin glulisine (APIDRA) 100 UNIT/ML injection Inject into the skin 3 (three) times daily before meals.   Yes Historical Provider, MD  Multiple Vitamin (MULITIVITAMIN WITH MINERALS) TABS Take 1 tablet by mouth daily.     Yes Historical Provider, MD  rosuvastatin (CRESTOR) 10 MG tablet Take 10 mg by mouth daily.     Yes Historical Provider, MD  venlafaxine (EFFEXOR-XR) 150 MG 24 hr capsule Take 150 mg by mouth daily.     Yes Historical Provider, MD    Allergies:  No Known Allergies  Social History:  reports that she has never smoked. She does not have any smokeless tobacco history on file. Her alcohol and drug histories not on file.patient lives at home and is functional with her ADLS  Family History: History reviewed. Patient denies any premature coronary disease with her parents or siblings. She denies any cancers in the family.  Physical Exam: Blood pressure 125/80, pulse 84, temperature 98.1 F (  36.7 C), temperature source Oral, resp. rate 20, SpO2 99.00%. General:  Alert, awake, oriented x3, in no acute distress. HEENT: normocephalic, atraumatic, anicteric sclera, pink conjunctiva, pupils equal and reactive to light and accomodation, oropharynx clear Neck: supple, no masses or lymphadenopathy, no goiter, no bruits  Heart: Regular rate and rhythm, without murmurs, rubs or gallops. Lungs: Clear to auscultation bilaterally, no wheezing, rales or rhonchi. Abdomen: Soft, nontender, nondistended, positive bowel sounds, no masses. Extremities: No clubbing, cyanosis or edema with positive pedal pulses, no calf tenderness, negative Homans sign. Neuro: Grossly intact, no focal neurological deficits, strength 5/5 upper and lower extremities bilaterally Psych: alert and oriented x 3, normal mood and affect Skin: no rashes or lesions, warm and dry   LABS on Admission:  Basic Metabolic Panel:  Lab 11/11/11 1610 11/11/11 0540  NA 142 139  K 3.6 3.6  CL 102 101  CO2 -- 29  GLUCOSE 182* 193*  BUN 21 21  CREATININE 1.00 1.04  CALCIUM -- 9.5  MG -- --  PHOS -- --   Liver Function Tests:  Lab 11/11/11 0540  AST 24  ALT 31  ALKPHOS 96  BILITOT 0.2*  PROT 7.3  ALBUMIN 3.9   CBC:  Lab 11/11/11 0621 11/11/11 0540  WBC -- 8.9  NEUTROABS -- --  HGB 15.0 13.7  HCT 44.0 40.7  MCV -- 87.3  PLT -- 285   CBG:  Lab 11/11/11 0846  GLUCAP 162*     Radiological Exams on Admission: Ct Head Wo Contrast  10/19/2011  *RADIOLOGY REPORT*  Clinical Data: Headaches and numbness/tingling and bilateral upper extremities.  CT HEAD WITHOUT CONTRAST  Technique:  Contiguous axial images were obtained from the base of the skull through the vertex without contrast.  Comparison: None.  Findings: No evidence of acute infarct, acute hemorrhage, mass lesion, mass effect or hydrocephalus.  Low density in the region of the right posterior cranial fossa is seen deep to craniectomy changes and is likely postoperative in etiology.  Scattered mucosal thickening or opacification of  the paranasal sinuses.  Mastoid air cells are clear.  IMPRESSION:  1.  No acute intracranial abnormality. 2.  Postoperative changes in the right posterior cranial fossa. 3.  Paranasal sinus inflammatory changes.  Original Report Authenticated By: Reyes Ivan, M.D.   Dg Chest Portable 1 View  11/11/2011  *RADIOLOGY REPORT*  Clinical Data: Near syncope.  Weakness and dizziness.  PORTABLE CHEST - 1 VIEW  Comparison: 09/18/2011  Findings: Slightly shallow inspiration. The heart size and pulmonary vascularity are normal. The lungs appear clear and expanded without focal air space disease or consolidation. No blunting of the costophrenic angles.  No pneumothorax.  No significant change since previous study.  IMPRESSION: No evidence of active pulmonary disease.  Original Report Authenticated By: Marlon Pel, M.D.    Assessment/Plan  .Hypotension: Likely secondary to dehydration, UA positive for ketones - Currently stable, will continue IV fluids, rule out ACS, obtain orthostatic vitals, random cortisol level given her history of pituitary tumor and meningioma removal, TSH.  - Does not appear to be any infectious etiology. Hold Tekturna (outpatient antihypertensive).   .Chest pain, atypical: - First set of cardiac enzymes negative, rule out ACS. D-dimer within normal range.  - EKG shows rate of 78 with no acute significant changes of ischemia. - Continue aspirin, statin. Place on PPI.  - Given patient does have risk factors of diabetes, hypertension, hyperlipidemia, prior echo in 2011 had shown EF of 40% with normal  wall motion. Patient is not interested in having another echocardiogram or inpatient stress test. - I discussed with [facility] cardiology on call who will arrange outpatient stress test for the patient.   .Anxiety: Patient may have had some anxiety component to her transient symptoms but currently stable.   .Diabetes mellitus: - Obtain hemoglobin A1c, place on sliding scale insulin  and Lantus   .Hyperlipidemia - Continue statins  DVT prophylaxis: Lovenox  CODE STATUS: Full code  Family communication: At bedside with the patient and her daughter  Further plan will depend as patient's clinical course evolves and further radiologic and laboratory data become available.   @Time  Spent on Admission: 60 minutes  RAI,RIPUDEEP M.D. [facility] 11/11/2011, 9:15 AM Pager: [phone]  If 7PM-7AM, please contact night-coverage [email or web address] Password [email or web address]

## 2011-11-11 NOTE — ED Notes (Signed)
Per EMS: pt states she got out of bed with foot and leg cramps, cramps spread to side.  Upon getting out of bed, pt was pale and diaphoretic.  Pt felt faint, did not lose consciousness.  CBG 192, initial BP was 88/49.  Last BP after 500 cc NS was 110/palp.

## 2011-11-12 LAB — BASIC METABOLIC PANEL
BUN: 14 mg/dL (ref 6–23)
CO2: 29 mEq/L (ref 19–32)
Calcium: 8.9 mg/dL (ref 8.4–10.5)
Creatinine, Ser: 0.8 mg/dL (ref 0.50–1.10)
GFR calc non Af Amer: 83 mL/min — ABNORMAL LOW (ref >90)
Glucose, Bld: 254 mg/dL — ABNORMAL HIGH (ref 70–99)
Sodium: 142 mEq/L (ref 135–145)

## 2011-11-12 LAB — CBC
MCH: 29.1 pg (ref 26.0–34.0)
MCHC: 32.9 g/dL (ref 30.0–36.0)
MCV: 88.5 fL (ref 78.0–100.0)
Platelets: 269 10*3/uL (ref 150–400)

## 2011-11-12 MED ORDER — PANTOPRAZOLE SODIUM 40 MG PO TBEC: 40.0000 mg | DELAYED_RELEASE_TABLET | Freq: Every day | ORAL | Status: DC

## 2011-11-12 NOTE — Discharge Summary (Signed)
Physician Discharge Summary  Patient ID: Destiny Beasley MRN: [id number] DOB/AGE: 54/25/1957 54 y.o.  Admit date: 11/11/2011 Discharge date: 11/12/2011  Primary Care Physician:  [name], MD  Discharge Diagnoses:     .Hypotension resolved slightly congested dehydration  .Chest pain, atypical resolved  .Anxiety .Diabetes mellitus .Hyperlipidemia  Consults: None  Discharge Medications: Medication List  As of 11/12/2011  9:50 AM   STOP taking these medications         aliskiren 300 MG tablet         TAKE these medications         aspirin 81 MG tablet   Take 320 mg by mouth daily.      estrogen-methylTESTOSTERone 0.625-1.25 MG per tablet   Take 1 tablet by mouth daily.      insulin glargine 100 UNIT/ML injection   Commonly known as: LANTUS   Inject 35 Units into the skin at bedtime.      insulin glulisine 100 UNIT/ML injection   Commonly known as: APIDRA   Inject into the skin 3 (three) times daily before meals.      multivitamin with minerals Tabs   Take 1 tablet by mouth daily.      pantoprazole 40 MG tablet   Commonly known as: PROTONIX   Take 1 tablet (40 mg total) by mouth daily at 6 (six) AM.      rosuvastatin 10 MG tablet   Commonly known as: CRESTOR   Take 10 mg by mouth daily.      venlafaxine XR 150 MG 24 hr capsule   Commonly known as: EFFEXOR-XR   Take 150 mg by mouth daily.             Brief H and P: For complete details please refer to admission H and P, but in brief, Patient is a 56 year old female who [facility] with history of hypertension, diabetes coming Friday, hyperlipidemia presented to ED with complaints of atypical chest pain. Patient states that she woke up with leg cramps this morning, then felt dizzy lightheaded, then developed chest pain. She stated that chest pain was midsternal, with no radiation, palpitations or diaphoresis, no numbness or tingling. She did have some shortness of breath however all symptoms  resolved in 30 minutes. Patient called EMS and was noted to be hypotensive with systolic blood pressure in 80's.     Hospital Course:    *Chest pain, atypical: Patient was admitted for overnight observation under telemetry monitor, serial cardiac enzymes remain negative. EKG showed rate of 78 with no acute ST T-wave changes suggestive of ischemia. D-dimer was within normal range. Patient was continued on aspirin and statin. She was also placed on PPI. Given patient does have risk factors of diabetes, hypertension, hyperlipidemia, prior echo in 2011 had shown EF of 40% with normal wall motion, she was recommended inpatient workup including echo or inpatient stress test. However she was not interested in having another echocardiogram or inpatient stress test. I discussed with a [facility] cardiology on call who will arrange outpatient stress test for her and patient will be called on Monday, 11/13/2011 with appointment.   Hypotension: Resolved likely due to dehydration, patient was placed on gentle hydration, her outpatient antihypertensive, tekturna was held. I recommended the patient to keep it on hold until she has a followup on 11/16/2011 with her PCP.   Diabetes mellitus: Poorly controlled, hemoglobin A1c is 10.9. I recommended the patient to be aggressive with her diabetes control and discussed  with her PCP on her appointment.   Hyperlipidemia: Patient was continued on statin.    Day of Discharge BP 122/72  Pulse 77  Temp 97.9 F (36.6 C) (Oral)  Resp 14  Ht 6' (1.829 m)  Wt 115.531 kg (254 lb 11.2 oz)  BMI 34.54 kg/m2  SpO2 99%  Physical Exam: General: Alert and awake oriented x3 not in any acute distress. HEENT: anicteric sclera, pupils reactive to light and accommodation CVS: S1-S2 clear no murmur rubs or gallops Chest: clear to auscultation bilaterally, no wheezing rales or rhonchi Abdomen: soft nontender, nondistended, normal bowel sounds, no organomegaly Extremities: no  cyanosis, clubbing or edema noted bilaterally Neuro: Cranial nerves II-XII intact, no focal neurological deficits   The results of significant diagnostics from this hospitalization (including imaging, microbiology, ancillary and laboratory) are listed below for reference.    LAB RESULTS: Basic Metabolic Panel:  Lab 11/12/11 1610 11/11/11 1023 11/11/11 0621 11/11/11 0540  NA 142 -- 142 --  K 3.9 -- 3.6 --  CL 103 -- 102 --  CO2 29 -- -- 29  GLUCOSE 254* -- 182* --  BUN 14 -- 21 --  CREATININE 0.80 0.91 -- --  CALCIUM 8.9 -- -- 9.5  MG -- -- -- --  PHOS -- -- -- --   Liver Function Tests:  Lab 11/11/11 0540  AST 24  ALT 31  ALKPHOS 96  BILITOT 0.2*  PROT 7.3  ALBUMIN 3.9   CBC:  Lab 11/12/11 0655 11/11/11 1023  WBC 6.1 9.1  NEUTROABS -- --  HGB 13.7 13.8  HCT 41.7 41.0  MCV 88.5 --  PLT 269 289   Cardiac Enzymes:  Lab 11/11/11 2100 11/11/11 1317  CKTOTAL 251* 214*  CKMB 3.2 3.1  CKMBINDEX -- --  TROPONINI <0.30 <0.30   BNP: No components found with this basename: POCBNP:2 CBG:  Lab 11/12/11 0747 11/11/11 2115  GLUCAP 221* 328*    Significant Diagnostic Studies:  Dg Chest Portable 1 View  11/11/2011  *RADIOLOGY REPORT*  Clinical Data: Near syncope.  Weakness and dizziness.  PORTABLE CHEST - 1 VIEW  Comparison: 09/18/2011  Findings: Slightly shallow inspiration. The heart size and pulmonary vascularity are normal. The lungs appear clear and expanded without focal air space disease or consolidation. No blunting of the costophrenic angles.  No pneumothorax.  No significant change since previous study.  IMPRESSION: No evidence of active pulmonary disease.  Original Report Authenticated By: Marlon Pel, M.D.     Disposition and Follow-up: Discharge Orders    Future Orders Please Complete By Expires   Diet - low sodium heart healthy      Increase activity slowly      Discharge instructions      Comments:   1) Please talk to your PCP, Dr Parke Simmers  regarding your diabetes       DISPOSITION:Home  DIET: Heart healthy, carb modified diet  ACTIVITY: As tolerated    DISCHARGE FOLLOW-UP Follow-up Information    Follow up with Chrystie Nose, MD. (Our office will call you on Monday with the date and time for your stress test.)    Contact information:   [address] [place] [address] [phone]       Follow up with [name], MD on 11/16/2011. (please keep your appt on 11/16/11)    Contact information:   [address] [place] [address] [phone]          Time spent  on Discharge:  Signed:   Lummie Montijo M.D. [facility] 11/12/2011, 9:50 AM Pager: [phone]  If 7PM-7AM, please contact night-coverage [email or web address] Password [email or web address]

## 2011-11-12 NOTE — Progress Notes (Signed)
Pt found to have taken IV out of arm.  Pt states it was hurting and she doesn't need it.  Explained to pt that it is protocol to maintain IV access while on telemetry.  Pt declined reinsertion.  Will notify MD.

## 2011-11-13 ENCOUNTER — Other Ambulatory Visit (HOSPITAL_COMMUNITY): Payer: Self-pay | Admitting: Cardiovascular Disease

## 2011-11-13 DIAGNOSIS — R079 Chest pain, unspecified: Secondary | ICD-10-CM

## 2011-11-20 ENCOUNTER — Encounter (HOSPITAL_COMMUNITY)
Admission: RE | Admit: 2011-11-20 | Discharge: 2011-11-20 | Disposition: A | Discharge status: Home / Self Care | Payer: 59 | Source: Ambulatory Visit | Attending: Cardiovascular Disease | Admitting: Cardiovascular Disease

## 2011-11-20 ENCOUNTER — Other Ambulatory Visit: Payer: Self-pay

## 2011-11-20 DIAGNOSIS — E119 Type 2 diabetes mellitus without complications: Secondary | ICD-10-CM | POA: Insufficient documentation

## 2011-11-20 DIAGNOSIS — E7800 Pure hypercholesterolemia, unspecified: Secondary | ICD-10-CM | POA: Insufficient documentation

## 2011-11-20 DIAGNOSIS — R0602 Shortness of breath: Secondary | ICD-10-CM | POA: Insufficient documentation

## 2011-11-20 DIAGNOSIS — R42 Dizziness and giddiness: Secondary | ICD-10-CM | POA: Insufficient documentation

## 2011-11-20 DIAGNOSIS — F411 Generalized anxiety disorder: Secondary | ICD-10-CM | POA: Insufficient documentation

## 2011-11-20 DIAGNOSIS — K219 Gastro-esophageal reflux disease without esophagitis: Secondary | ICD-10-CM | POA: Insufficient documentation

## 2011-11-20 DIAGNOSIS — E785 Hyperlipidemia, unspecified: Secondary | ICD-10-CM | POA: Insufficient documentation

## 2011-11-20 DIAGNOSIS — R079 Chest pain, unspecified: Secondary | ICD-10-CM

## 2011-11-20 DIAGNOSIS — I10 Essential (primary) hypertension: Secondary | ICD-10-CM | POA: Insufficient documentation

## 2011-11-20 MED ORDER — REGADENOSON 0.4 MG/5ML IV SOLN
INTRAVENOUS | Status: AC
  Administered 2011-11-20: 0.4 mg via INTRAVENOUS
  Filled 2011-11-20: qty 5

## 2011-11-20 MED ORDER — REGADENOSON 0.4 MG/5ML IV SOLN
0.4000 mg | Freq: Once | INTRAVENOUS | Status: AC
  Administered 2011-11-20: 0.4 mg via INTRAVENOUS

## 2011-11-20 MED ORDER — TECHNETIUM TC 99M TETROFOSMIN IV KIT
30.0000 | PACK | Freq: Once | INTRAVENOUS | Status: AC | PRN
  Administered 2011-11-20: 30 via INTRAVENOUS

## 2011-11-20 MED ORDER — TECHNETIUM TC 99M TETROFOSMIN IV KIT
10.0000 | PACK | Freq: Once | INTRAVENOUS | Status: AC | PRN
  Administered 2011-11-20: 10 via INTRAVENOUS

## 2011-12-28 ENCOUNTER — Ambulatory Visit (INDEPENDENT_AMBULATORY_CARE_PROVIDER_SITE_OTHER): Payer: 59 | Admitting: Emergency Medicine

## 2011-12-28 VITALS — BP 132/84 | HR 96 | Temp 98.9°F | Resp 20 | Ht 71.5 in | Wt 254.6 lb

## 2011-12-28 DIAGNOSIS — J029 Acute pharyngitis, unspecified: Secondary | ICD-10-CM

## 2011-12-28 DIAGNOSIS — L299 Pruritus, unspecified: Secondary | ICD-10-CM

## 2011-12-28 MED ORDER — FLUTICASONE PROPIONATE 50 MCG/ACT NA SUSP: 2.0000 | Freq: Every day | NASAL | Status: DC

## 2011-12-28 NOTE — Progress Notes (Signed)
  Subjective:    Patient ID: Destiny Beasley, female    DOB: 1956-03-20, 56 y.o.   MRN: [id number]  HPI patient is here with a sore throat. She states that on Monday they [place] [employer]. Since then she has had any sore throat itching of her face that has not been associated with nasal congestion cough or swollen glands. Does not feel she is to    Review of Systems     Objective:   Physical Exam  Constitutional: She appears well-developed and well-nourished.  HENT:  Head: Normocephalic.       There is mild redness in the posterior pharynx the tonsils are enlarged  Eyes: Pupils are equal, round, and reactive to light.  Neck: Normal range of motion. No tracheal deviation present. No thyromegaly present.  Cardiovascular: Normal rate and regular rhythm.   Pulmonary/Chest: Effort normal and breath sounds normal. No respiratory distress. She has no wheezes.  Abdominal: Soft. She exhibits no distension. There is no tenderness.    Results for orders placed in visit on 12/28/11  POCT RAPID STREP A (OFFICE)      Component Value Range   Rapid Strep A Screen Negative  Negative        Assessment & Plan:  His symptoms may be allergic in nature. I want you to take Zyrtec 10 mg one a day. I would like to take Flonase 2 puffs each side of the nose once a day.

## 2011-12-28 NOTE — Patient Instructions (Addendum)
Allergies, Generic Allergies may happen from anything your body is sensitive to. This may be food, medicines, pollens, chemicals, and nearly anything around you in everyday life that produces allergens. An allergen is anything that causes an allergy producing substance. Heredity is often a factor in causing these problems. This means you may have some of the same allergies as your parents. Food allergies happen in all age groups. Food allergies are some of the most severe and life threatening. Some common food allergies are cow's milk, seafood, eggs, nuts, wheat, and soybeans. SYMPTOMS   Swelling around the mouth.   An itchy red rash or hives.   Vomiting or diarrhea.   Difficulty breathing.  SEVERE ALLERGIC REACTIONS ARE LIFE-THREATENING. This reaction is called anaphylaxis. It can cause the mouth and throat to swell and cause difficulty with breathing and swallowing. In severe reactions only a trace amount of food (for example, peanut oil in a salad) may cause death within seconds. Seasonal allergies occur in all age groups. These are seasonal because they usually occur during the same season every year. They may be a reaction to molds, grass pollens, or tree pollens. Other causes of problems are house dust mite allergens, pet dander, and mold spores. The symptoms often consist of nasal congestion, a runny itchy nose associated with sneezing, and tearing itchy eyes. There is often an associated itching of the mouth and ears. The problems happen when you come in contact with pollens and other allergens. Allergens are the particles in the air that the body reacts to with an allergic reaction. This causes you to release allergic antibodies. Through a chain of events, these eventually cause you to release histamine into the blood stream. Although it is meant to be protective to the body, it is this release that causes your discomfort. This is why you were given anti-histamines to feel better. If you are  unable to pinpoint the offending allergen, it may be determined by skin or blood testing. Allergies cannot be cured but can be controlled with medicine. Hay fever is a collection of all or some of the seasonal allergy problems. It may often be treated with simple over-the-counter medicine such as diphenhydramine. Take medicine as directed. Do not drink alcohol or drive while taking this medicine. Check with your caregiver or package insert for child dosages. If these medicines are not effective, there are many new medicines your caregiver can prescribe. Stronger medicine such as nasal spray, eye drops, and corticosteroids may be used if the first things you try do not work well. Other treatments such as immunotherapy or desensitizing injections can be used if all else fails. Follow up with your caregiver if problems continue. These seasonal allergies are usually not life threatening. They are generally more of a nuisance that can often be handled using medicine. HOME CARE INSTRUCTIONS   If unsure what causes a reaction, keep a diary of foods eaten and symptoms that follow. Avoid foods that cause reactions.   If hives or rash are present:   Take medicine as directed.   You may use an over-the-counter antihistamine (diphenhydramine) for hives and itching as needed.   Apply cold compresses (cloths) to the skin or take baths in cool water. Avoid hot baths or showers. Heat will make a rash and itching worse.   If you are severely allergic:   Following a treatment for a severe reaction, hospitalization is often required for closer follow-up.   Wear a medic-alert bracelet or necklace stating the allergy.     You and your family must learn how to give adrenaline or use an anaphylaxis kit.   If you have had a severe reaction, always carry your anaphylaxis kit or EpiPen with you. Use this medicine as directed by your caregiver if a severe reaction is occurring. Failure to do so could have a fatal  outcome.  SEEK MEDICAL CARE IF:  You suspect a food allergy. Symptoms generally happen within 30 minutes of eating a food.   Your symptoms have not gone away within 2 days or are getting worse.   You develop new symptoms.   You want to retest yourself or your child with a food or drink you think causes an allergic reaction. Never do this if an anaphylactic reaction to that food or drink has happened before. Only do this under the care of a caregiver.  SEEK IMMEDIATE MEDICAL CARE IF:   You have difficulty breathing, are wheezing, or have a tight feeling in your chest or throat.   You have a swollen mouth, or you have hives, swelling, or itching all over your body.   You have had a severe reaction that has responded to your anaphylaxis kit or an EpiPen. These reactions may return when the medicine has worn off. These reactions should be considered life threatening.  MAKE SURE YOU:   Understand these instructions.   Will watch your condition.   Will get help right away if you are not doing well or get worse.  Document Released: 07/04/2002 Document Revised: 03/30/2011 Document Reviewed: 12/09/2007 [employer] Patient Information 2012 [employer]. 

## 2012-02-08 ENCOUNTER — Encounter: Payer: Self-pay | Admitting: Family Medicine

## 2012-02-08 ENCOUNTER — Ambulatory Visit (INDEPENDENT_AMBULATORY_CARE_PROVIDER_SITE_OTHER): Payer: Managed Care, Other (non HMO) | Admitting: Family Medicine

## 2012-02-08 VITALS — BP 122/77 | HR 102 | Temp 98.3°F | Resp 16 | Ht 72.0 in | Wt 253.2 lb

## 2012-02-08 DIAGNOSIS — Z283 Underimmunization status: Secondary | ICD-10-CM

## 2012-02-08 DIAGNOSIS — L0390 Cellulitis, unspecified: Secondary | ICD-10-CM

## 2012-02-08 DIAGNOSIS — Z2839 Other underimmunization status: Secondary | ICD-10-CM

## 2012-02-08 DIAGNOSIS — L0291 Cutaneous abscess, unspecified: Secondary | ICD-10-CM

## 2012-02-08 DIAGNOSIS — Z23 Encounter for immunization: Secondary | ICD-10-CM

## 2012-02-08 MED ORDER — DOXYCYCLINE HYCLATE 100 MG PO TABS: 100.0000 mg | ORAL_TABLET | Freq: Two times a day (BID) | ORAL | Status: DC

## 2012-02-08 NOTE — Progress Notes (Signed)
 [facility]:  Office Visit  Chief Complaint:  Chief Complaint  Patient presents with  . Mass    bump on back x 1 week    HPI: Destiny Beasley is a 56 y.o. female who complains of  Back infection , was a black head until she popped it one week ago and now has gotten worse. Has not tried anything. + redness, pain with laying down, no dc. No prior skin infections.  Her diabetes is not well controlled. Last HbA1c was 10.9 in 10/2011.   Would like flu vaccine.   Past Medical History  Diagnosis Date  . Hypertension   . Diabetes mellitus   . Anxiety   . Hypercholesteremia    History reviewed. No pertinent past surgical history. History   Social History  . Marital Status: Divorced    Spouse Name: N/A    Number of Children: N/A  . Years of Education: N/A   Social History Main Topics  . Smoking status: Never Smoker   . Smokeless tobacco: None  . Alcohol Use: 1.8 oz/week    3 Glasses of wine per week  . Drug Use: No  . Sexually Active: Yes   Other Topics Concern  . None   Social History Narrative  . None   No family history on file. No Known Allergies Prior to Admission medications   Medication Sig Start Date End Date Taking? Authorizing Provider  aspirin 325 MG EC tablet Take 325 mg by mouth daily.   Yes Historical Provider, MD  estrogen-methylTESTOSTERone (ESTRATEST HS) 0.625-1.25 MG per tablet Take 1 tablet by mouth daily. Taking 1/2 tablet daily   Yes Historical Provider, MD  insulin glargine (LANTUS) 100 UNIT/ML injection Inject 35 Units into the skin at bedtime.     Yes Historical Provider, MD  insulin glulisine (APIDRA) 100 UNIT/ML injection Inject into the skin 3 (three) times daily before meals. Prn large meals   Yes Historical Provider, MD  Multiple Vitamin (MULITIVITAMIN WITH MINERALS) TABS Take 1 tablet by mouth daily.     Yes Historical Provider, MD  venlafaxine (EFFEXOR-XR) 150 MG 24 hr capsule Take 150 mg by mouth daily.     Yes Historical  Provider, MD  Aliskiren-Hydrochlorothiazide (TEKTURNA HCT) 300-12.5 MG TABS Take by mouth daily.    Historical Provider, MD  fluticasone (FLONASE) 50 MCG/ACT nasal spray Place 2 sprays into the nose daily. 12/28/11 12/27/12  Collene Gobble, MD  pantoprazole (PROTONIX) 40 MG tablet Take 1 tablet (40 mg total) by mouth daily at 6 (six) AM. 11/12/11 11/11/12  Ripudeep Jenna Luo, MD     ROS: The patient denies fevers, chills, night sweats, unintentional weight loss, chest pain, palpitations, wheezing, dyspnea on exertion, nausea, vomiting, abdominal pain, dysuria, hematuria, melena, numbness, weakness, or tingling.   All other systems have been reviewed and were otherwise negative with the exception of those mentioned in the HPI and as above.    PHYSICAL EXAM: Filed Vitals:   02/08/12 0820  BP: 122/77  Pulse: 102  Temp: 98.3 F (36.8 C)  Resp: 16   Filed Vitals:   02/08/12 0820  Height: 6' (1.829 m)  Weight: 253 lb 3.2 oz (114.851 kg)   Body mass index is 34.34 kg/(m^2).  General: Alert, no acute distress HEENT:  Normocephalic, atraumatic, oropharynx patent.  Cardiovascular:  Regular rate and rhythm, no rubs murmurs or gallops.  No Carotid bruits, radial pulse intact. No pedal edema.  Respiratory: Clear to auscultation bilaterally.  No  wheezes, rales, or rhonchi.  No cyanosis, no use of accessory musculature GI: No organomegaly, abdomen is soft and non-tender, positive bowel sounds.  No masses. Skin: Back cellulitis early abscess 1 x 1 inch. Red, slightly warm, tender. No dc.  Neurologic: Facial musculature symmetric. Psychiatric: Patient is appropriate throughout our interaction. Lymphatic: No cervical lymphadenopathy Musculoskeletal: Gait intact.   LABS: Results for orders placed in visit on 12/28/11  POCT RAPID STREP A (OFFICE)      Component Value Range   Rapid Strep A Screen Negative  Negative     EKG/XRAY:   Primary read interpreted by Dr. Conley Rolls at  [facility].   ASSESSMENT/PLAN: Encounter Diagnoses  Name Primary?  . Cellulitis and abscess Yes  . Immunization deficiency    Rx Doxycycline Flu vaccine given Patient advise to use warm compresses on back since right now there is nothing underneath it to actually I&D. If it becomes fluctuant then return for I&D   ,  PHUONG, DO 02/08/2012 9:07 AM

## 2012-02-12 ENCOUNTER — Telehealth: Payer: Self-pay

## 2012-02-12 NOTE — Telephone Encounter (Signed)
Called pharmacy who reported they did not receive the Rx. Gave Rx info over the phone and pharm will contact pt when ready.

## 2012-02-12 NOTE — Telephone Encounter (Signed)
Message copied by Vicenta Dunning on Mon Feb 12, 2012  2:31 PM ------      Message from: Fernande Bras      Created: Mon Feb 12, 2012 12:54 PM      Regarding: please call pharmacy       Please call the pharmacy.  The patient states they do no have the Rx we sent 02/08/2012.  If that is true, please call in the Rx as documented in the OV 02/08/2012.

## 2012-02-13 ENCOUNTER — Telehealth: Payer: Self-pay

## 2012-02-13 ENCOUNTER — Other Ambulatory Visit: Payer: Self-pay | Admitting: Family Medicine

## 2012-02-13 DIAGNOSIS — L0390 Cellulitis, unspecified: Secondary | ICD-10-CM

## 2012-02-13 MED ORDER — CEPHALEXIN 500 MG PO CAPS: 500.0000 mg | ORAL_CAPSULE | Freq: Four times a day (QID) | ORAL | Status: DC

## 2012-02-13 NOTE — Telephone Encounter (Signed)
PT STATES THE MEDICINE SHE WAS GIVEN BY DR LE IS TO EXPENSIVE AND SHE CAN'T AFFORD IT. WOULD LIKE TO KNOW IF THERE IS ANYTHING ELSE A LOT CHEAPER PLEASE CALL [phone]

## 2012-02-13 NOTE — Telephone Encounter (Signed)
Dr Conley Rolls, pt states that the doxy you prescribed is $70 at [facility].. Anything else we can prescribe?  Please advise.

## 2012-02-15 ENCOUNTER — Ambulatory Visit (INDEPENDENT_AMBULATORY_CARE_PROVIDER_SITE_OTHER): Payer: Managed Care, Other (non HMO) | Admitting: Family Medicine

## 2012-02-15 VITALS — BP 160/80 | HR 90 | Temp 98.4°F | Resp 18 | Ht 72.0 in | Wt 256.0 lb

## 2012-02-15 DIAGNOSIS — L0291 Cutaneous abscess, unspecified: Secondary | ICD-10-CM

## 2012-02-15 DIAGNOSIS — R52 Pain, unspecified: Secondary | ICD-10-CM

## 2012-02-15 NOTE — Progress Notes (Signed)
[facility]:  Office Visit  Chief Complaint:  Chief Complaint  Patient presents with  . cyst on back    upper back, worse from 02/08/12 visit    HPI: Destiny Beasley is a 56 y.o. female who complains of  Right upper back pain related to possible skin infection/cyst . She has worsenign sharp 10/10 pain with any movement and tactile force on the area. She was seen here by myself and rx doxycyline which she could not afford to fill so after several days she started taking the new rx of keflex. There has been no improvement. We tried conservative treatment since there was no fluctuance underneath the skin. SHe has been putting warm compresses on it and putting her back underneath a warm shower.   Past Medical History  Diagnosis Date  . Hypertension   . Diabetes mellitus   . Anxiety   . Hypercholesteremia    No past surgical history on file. History   Social History  . Marital Status: Divorced    Spouse Name: N/A    Number of Children: N/A  . Years of Education: N/A   Social History Main Topics  . Smoking status: Never Smoker   . Smokeless tobacco: None  . Alcohol Use: 1.8 oz/week    3 Glasses of wine per week  . Drug Use: No  . Sexually Active: Yes   Other Topics Concern  . None   Social History Narrative  . None   No family history on file. No Known Allergies Prior to Admission medications   Medication Sig Start Date End Date Taking? Authorizing Provider  aspirin 325 MG EC tablet Take 325 mg by mouth daily.   Yes Historical Provider, MD  cephALEXin (KEFLEX) 500 MG capsule Take 1 capsule (500 mg total) by mouth 4 (four) times daily. 02/13/12  Yes Euriah Matlack P Jonothan Heberle, DO  cholecalciferol (VITAMIN D) 1000 UNITS tablet Take 1,000 Units by mouth daily.   Yes Historical Provider, MD  estrogen-methylTESTOSTERone (ESTRATEST HS) 0.625-1.25 MG per tablet Take 1 tablet by mouth daily. Taking 1/2 tablet daily   Yes Historical Provider, MD  insulin glargine (LANTUS) 100  UNIT/ML injection Inject 35 Units into the skin at bedtime.     Yes Historical Provider, MD  insulin glulisine (APIDRA) 100 UNIT/ML injection Inject into the skin 3 (three) times daily before meals. Prn large meals   Yes Historical Provider, MD  Multiple Vitamin (MULITIVITAMIN WITH MINERALS) TABS Take 1 tablet by mouth daily.     Yes Historical Provider, MD  venlafaxine (EFFEXOR-XR) 150 MG 24 hr capsule Take 150 mg by mouth daily.     Yes Historical Provider, MD  fluticasone (FLONASE) 50 MCG/ACT nasal spray Place 2 sprays into the nose daily. 12/28/11 12/27/12  Collene Gobble, MD  pantoprazole (PROTONIX) 40 MG tablet Take 1 tablet (40 mg total) by mouth daily at 6 (six) AM. 11/12/11 11/11/12  Ripudeep Jenna Luo, MD     ROS: The patient denies fevers, chills, night sweats, unintentional weight loss, chest pain, palpitations, wheezing, dyspnea on exertion, nausea, vomiting, abdominal pain, dysuria, hematuria, melena, numbness, weakness, or tingling.  All other systems have been reviewed and were otherwise negative with the exception of those mentioned in the HPI and as above.    PHYSICAL EXAM: Filed Vitals:   02/15/12 1705  BP: 160/80  Pulse: 90  Temp: 98.4 F (36.9 C)  Resp: 18   Filed Vitals:   02/15/12 1705  Height: 6' (1.829  m)  Weight: 256 lb (116.121 kg)   Body mass index is 34.72 kg/(m^2).  General: Alert, no acute distress HEENT:  Normocephalic, atraumatic, oropharynx patent.  Cardiovascular:  Regular rate and rhythm, no rubs murmurs or gallops.  No Carotid bruits, radial pulse intact. No pedal edema.  Respiratory: Clear to auscultation bilaterally.  No wheezes, rales, or rhonchi.  No cyanosis, no use of accessory musculature GI: No organomegaly, abdomen is soft and non-tender, positive bowel sounds.  No masses. Skin: + right upper back erythematous, warm lesion 1 inch by 1/2 inch area  Neurologic: Facial musculature symmetric. Psychiatric: Patient is appropriate throughout our  interaction. Lymphatic: No cervical lymphadenopathy Musculoskeletal: Gait intact.   LABS: Results for orders placed in visit on 12/28/11  POCT RAPID STREP A (OFFICE)      Component Value Range   Rapid Strep A Screen Negative  Negative     EKG/XRAY:   Primary read interpreted by Dr. Conley Rolls at [facility].   ASSESSMENT/PLAN: Encounter Diagnosis  Name Primary?  . Cellulitis and abscess Yes   Continue with Keflex 500 mg QID x 10 days Motrin prn pain, patient declined narcotic pain meds Wound culture pending    Dali Kraner PHUONG, DO 02/15/2012 6:01 PM

## 2012-02-15 NOTE — Progress Notes (Signed)
   Patient ID: [name] MRN: [id number], DOB: 1955/09/30, 56 y.o. Date of Encounter: 02/15/2012, 5:56 PM    PROCEDURE NOTE: Verbal consent obtained. Betadine prep per usual protocol. Local anesthesia obtained with 2% lidocaine wit epi 1.5 cc.  1 cm incision made with 11 blade along lesion.  Culture taken. Moderate purulence expressed. Lesion explored revealing no loculations. Irrigated with normal saline. Packed with small amount of 1/4 plain packing. Dressed. Wound care instructions including precautions with patient. Patient tolerated the procedure well. Recheck in 48 hours.      Signed, Eula Listen, PA-C 02/15/2012 5:56 PM

## 2012-02-17 ENCOUNTER — Ambulatory Visit (INDEPENDENT_AMBULATORY_CARE_PROVIDER_SITE_OTHER): Payer: Managed Care, Other (non HMO) | Admitting: Physician Assistant

## 2012-02-17 VITALS — BP 136/82 | HR 93 | Temp 97.9°F | Resp 16 | Ht 72.0 in | Wt 252.0 lb

## 2012-02-17 DIAGNOSIS — L02219 Cutaneous abscess of trunk, unspecified: Secondary | ICD-10-CM

## 2012-02-17 DIAGNOSIS — L02212 Cutaneous abscess of back [any part, except buttock]: Secondary | ICD-10-CM

## 2012-02-17 NOTE — Progress Notes (Signed)
   Patient ID: Destiny Beasley MRN: [id number], DOB: [birth year] 56 y.o. Date of Encounter: 02/17/2012, 1:50 PM  Primary Physician: [name], MD  Chief Complaint: Wound care   See previous note  HPI: 56 y.o. y/o female presents for wound care s/p I&D on 02/15/12 Doing well No issues or complaints Afebrile/ no chills No nausea or vomiting Tolerating Keflex Pain mostly resolved Previous note reviewed  Past Medical History  Diagnosis Date  . Hypertension   . Diabetes mellitus   . Anxiety   . Hypercholesteremia      Home Meds: Prior to Admission medications   Medication Sig Start Date End Date Taking? Authorizing Provider  aspirin 325 MG EC tablet Take 325 mg by mouth daily.    Historical Provider, MD  cephALEXin (KEFLEX) 500 MG capsule Take 1 capsule (500 mg total) by mouth 4 (four) times daily. 02/13/12   Thao P Le, DO  cholecalciferol (VITAMIN D) 1000 UNITS tablet Take 1,000 Units by mouth daily.    Historical Provider, MD  estrogen-methylTESTOSTERone (ESTRATEST HS) 0.625-1.25 MG per tablet Take 1 tablet by mouth daily. Taking 1/2 tablet daily    Historical Provider, MD  fluticasone (FLONASE) 50 MCG/ACT nasal spray Place 2 sprays into the nose daily. 12/28/11 12/27/12  Collene Gobble, MD  insulin glargine (LANTUS) 100 UNIT/ML injection Inject 35 Units into the skin at bedtime.      Historical Provider, MD  insulin glulisine (APIDRA) 100 UNIT/ML injection Inject into the skin 3 (three) times daily before meals. Prn large meals    Historical Provider, MD  Multiple Vitamin (MULITIVITAMIN WITH MINERALS) TABS Take 1 tablet by mouth daily.      Historical Provider, MD  pantoprazole (PROTONIX) 40 MG tablet Take 1 tablet (40 mg total) by mouth daily at 6 (six) AM. 11/12/11 11/11/12  Ripudeep Jenna Luo, MD  venlafaxine (EFFEXOR-XR) 150 MG 24 hr capsule Take 150 mg by mouth daily.      Historical Provider, MD    Allergies: No Known Allergies  ROS: Constitutional: Afebrile, no  chills Cardiovascular: negative for chest pain or palpitations Dermatological: Positive for wound. Negative for erythema, pain, or warmth  GI: No nausea or vomiting   EXAM: Physical Exam: Blood pressure 136/82, pulse 93, temperature 97.9 F (36.6 C), resp. rate 16, height 6' (1.829 m), weight 252 lb (114.306 kg)., Body mass index is 34.18 kg/(m^2). General: Well developed, well nourished, in no acute distress. Nontoxic appearing. Head: Normocephalic, atraumatic, sclera non-icteric.  Neck: Supple. Lungs: Breathing is unlabored. Heart: Normal rate. Skin:  Warm and moist. Dressing and packing in place. Improved induration without erythema, or tenderness to palpation. Neuro: Alert and oriented X 3. Moves all extremities spontaneously. Normal gait.  Psych:  Responds to questions appropriately with a normal affect.   PROCEDURE: Dressing and packing removed. Small amount of purulence expressed Wound bed healthy Irrigated with 1% plain lidocaine 5 cc. Repacked with 1/4 inch plain packing Dressing applied  LAB: Culture: No results available on CHL    A/P: 56 y.o. y/o female with cellulitis/abscess as above s/p I&D on 02/15/12 -Wound care per above -Continue Keflex -Pain well controlled -Daily dressing changes -Recheck 48 hours  Signed, Eula Listen, PA-C 02/17/2012 1:50 PM

## 2012-02-19 ENCOUNTER — Ambulatory Visit: Payer: Managed Care, Other (non HMO)

## 2012-02-19 ENCOUNTER — Ambulatory Visit (INDEPENDENT_AMBULATORY_CARE_PROVIDER_SITE_OTHER): Payer: Managed Care, Other (non HMO) | Admitting: Physician Assistant

## 2012-02-19 VITALS — BP 144/87 | HR 90 | Temp 98.1°F | Resp 18 | Ht 72.5 in | Wt 255.0 lb

## 2012-02-19 DIAGNOSIS — L02219 Cutaneous abscess of trunk, unspecified: Secondary | ICD-10-CM

## 2012-02-19 DIAGNOSIS — L02212 Cutaneous abscess of back [any part, except buttock]: Secondary | ICD-10-CM

## 2012-02-19 LAB — WOUND CULTURE
Gram Stain: NONE SEEN
Gram Stain: NONE SEEN
Gram Stain: NONE SEEN
Organism ID, Bacteria: NO GROWTH

## 2012-02-19 NOTE — Progress Notes (Signed)
  Subjective:    Patient ID: Destiny Beasley, female    DOB: Sep 07, 1955, 56 y.o.   MRN: [id number]  HPI  Destiny Beasley is a 56 yr old female here for wound check s/p I&D 02/15/2012.  Pt is doing well.  No pain.  Has not been changing dressing at home.  Tolerating Keflex well.  No fever, chills, NVD.      Review of Systems  All other systems reviewed and are negative.       Objective:   Physical Exam  Constitutional: She is oriented to person, place, and time. She appears well-developed and well-nourished. No distress.  Neurological: She is alert and oriented to person, place, and time.  Skin: Skin is warm and dry.          Wound upper back; indurated skin around the area but no warmth or erythema, no TTP  Psychiatric: She has a normal mood and affect. Her behavior is normal.    Wound Care: Packing removed.  Unable to express any purulent material.  Wound irrigated with 1% plain lidocaine.  Wound bed appears healthy.  Packing replaced, dressing applied.        Assessment & Plan:   1. Abscess of back    Destiny Beasley is a 56 yr old female here for dressing change s/p I&D.  Wound appears to be healing well.  Encouraged patient to change to dressing 1-2 times per day.  Continue Keflex.  Given fast-track card to return for wound care on 02/21/12.

## 2012-02-20 ENCOUNTER — Ambulatory Visit: Payer: 59 | Admitting: Obstetrics and Gynecology

## 2012-02-21 ENCOUNTER — Ambulatory Visit (INDEPENDENT_AMBULATORY_CARE_PROVIDER_SITE_OTHER): Payer: Managed Care, Other (non HMO) | Admitting: Physician Assistant

## 2012-02-21 VITALS — BP 142/87 | HR 97 | Temp 98.1°F | Resp 16 | Ht 72.0 in | Wt 253.0 lb

## 2012-02-21 DIAGNOSIS — L03319 Cellulitis of trunk, unspecified: Secondary | ICD-10-CM

## 2012-02-21 DIAGNOSIS — L02219 Cutaneous abscess of trunk, unspecified: Secondary | ICD-10-CM

## 2012-02-21 DIAGNOSIS — L02212 Cutaneous abscess of back [any part, except buttock]: Secondary | ICD-10-CM

## 2012-02-21 NOTE — Progress Notes (Signed)
Patient ID: XOIE KREUSER MRN: [id number], DOB: [birth year] 56 y.o. Date of Encounter: 02/21/2012, 8:37 AM  Chief Complaint: Wound care   See previous note  HPI: 56 y.o. y/o female presents for wound care s/p I&D on 02/15/12 Doing well No issues or complaints Afebrile/ no chills No nausea or vomiting Tolerating Keflex No pain Daily dressing change Previous note reviewed  Past Medical History  Diagnosis Date  . Hypertension   . Diabetes mellitus   . Anxiety   . Hypercholesteremia      Home Meds: Prior to Admission medications   Medication Sig Start Date End Date Taking? Authorizing Provider  aspirin 325 MG EC tablet Take 325 mg by mouth daily.   Yes Historical Provider, MD  cephALEXin (KEFLEX) 500 MG capsule Take 1 capsule (500 mg total) by mouth 4 (four) times daily. 02/13/12  Yes Thao P Le, DO  cholecalciferol (VITAMIN D) 1000 UNITS tablet Take 1,000 Units by mouth daily.   Yes Historical Provider, MD  estrogen-methylTESTOSTERone (ESTRATEST HS) 0.625-1.25 MG per tablet Take 1 tablet by mouth daily. Taking 1/2 tablet daily   Yes Historical Provider, MD  fluticasone (FLONASE) 50 MCG/ACT nasal spray Place 2 sprays into the nose daily. 12/28/11 12/27/12 Yes Collene Gobble, MD  insulin glargine (LANTUS) 100 UNIT/ML injection Inject 35 Units into the skin at bedtime.     Yes Historical Provider, MD  insulin glulisine (APIDRA) 100 UNIT/ML injection Inject into the skin 3 (three) times daily before meals. Prn large meals   Yes Historical Provider, MD  Multiple Vitamin (MULITIVITAMIN WITH MINERALS) TABS Take 1 tablet by mouth daily.     Yes Historical Provider, MD  pantoprazole (PROTONIX) 40 MG tablet Take 1 tablet (40 mg total) by mouth daily at 6 (six) AM. 11/12/11 11/11/12 Yes Ripudeep Jenna Luo, MD  venlafaxine (EFFEXOR-XR) 150 MG 24 hr capsule Take 150 mg by mouth daily.     Yes Historical Provider, MD    Allergies: No Known Allergies  ROS: Constitutional: Afebrile, no  chills Cardiovascular: negative for chest pain or palpitations Dermatological: Positive for wound. Negative for erythema, pain, or warmth.  GI: No nausea or vomiting   EXAM: Physical Exam: Blood pressure 142/87, pulse 97, temperature 98.1 F (36.7 C), temperature source Oral, resp. rate 16, height 6' (1.829 m), weight 253 lb (114.76 kg), SpO2 99.00%., Body mass index is 34.31 kg/(m^2). General: Well developed, well nourished, in no acute distress. Nontoxic appearing. Head: Normocephalic, atraumatic, sclera non-icteric.  Neck: Supple. Lungs: Breathing is unlabored. Heart: Normal rate. Skin:  Warm and moist. Dressing and packing in place. No induration, erythema, or tenderness to palpation. Neuro: Alert and oriented X 3. Moves all extremities spontaneously. Normal gait.  Psych:  Responds to questions appropriately with a normal affect.       PROCEDURE: Dressing and packing removed. No purulence expressed. There is purulent drainage on packing.  Wound bed healthy Irrigated with 1% plain lidocaine 5 cc. Repacked with 1/4" plain Dressing applied  LAB: Culture: No growth  A/P: 56 y.o. y/o female with back cellulitis/abscess as above s/p I&D on Wound care per above Continue Keflex Pain well controlled Daily dressing changes Instructed patient to take packing out in 48 hours.   Follow up if needed   Signed, Rhoderick Moody, PA-C 02/21/2012 8:37 AM

## 2012-03-08 ENCOUNTER — Encounter: Payer: Self-pay | Admitting: Obstetrics and Gynecology

## 2012-03-08 ENCOUNTER — Ambulatory Visit (INDEPENDENT_AMBULATORY_CARE_PROVIDER_SITE_OTHER): Payer: Private Health Insurance - Indemnity | Admitting: Obstetrics and Gynecology

## 2012-03-08 VITALS — BP 118/76 | HR 80 | Ht 72.0 in | Wt 252.0 lb

## 2012-03-08 DIAGNOSIS — Z113 Encounter for screening for infections with a predominantly sexual mode of transmission: Secondary | ICD-10-CM

## 2012-03-08 DIAGNOSIS — N951 Menopausal and female climacteric states: Secondary | ICD-10-CM

## 2012-03-08 DIAGNOSIS — Z01419 Encounter for gynecological examination (general) (routine) without abnormal findings: Secondary | ICD-10-CM

## 2012-03-08 MED ORDER — METRONIDAZOLE 0.75 % VA GEL: VAGINAL | Status: DC

## 2012-03-08 NOTE — Progress Notes (Signed)
Regular Periods: no Mammogram: yes  Monthly Breast Ex.: yes Exercise: yes  Tetanus < 10 years: yes Seatbelts: yes  NI. Bladder Functn.: yes Abuse at home: no  Daily BM's: yes Stressful Work: no  Healthy Diet: yes Sigmoid-Colonoscopy: 2012  Every 5 years  Calcium: no Medical problems this year: none   LAST PAP:7/12  Contraception:none pt had hyst  Mammogram:  2 years ago  PCP: DR. LISA MILLER  PMH: NO CHANGE  FMH: NO CHANGE  Last Bone Scan: NO  PT IS DIVORCED AND DATING

## 2012-03-08 NOTE — Progress Notes (Signed)
Subjective:    Destiny Beasley is a 56 y.o. female G4P0 who presents for annual exam. The patient has no complaints today.    Review of Systems Gastrointestinal:No change in bowel habits, no abdominal pain, no rectal bleeding Genitourinary:negative for abnormal vaginal bleeding,  dysuria, frequency, hematuria, nocturia and urinary incontinence    Objective:     BP 118/76  Pulse 80  Ht 6' (1.829 m)  Wt 252 lb (114.306 kg)  BMI 34.18 kg/m2 Weight:  Wt Readings from Last 1 Encounters:  03/08/12 252 lb (114.306 kg)   Body mass index is 34.18 kg/(m^2). General Appearance:  Well nourished in no acute distress HEENT: Grossly normal Neck / Thyroid: Supple, no masses, nodes or enlargement Lungs: Clear to auscultation bilaterally Back: No CVA tenderness Breast Exam: No masses or nodes.No dimpling, nipple retraction or discharge. Cardiovascular: Regular rate and rhythm.  Gastrointestinal: Soft, non-tender, no masses or organomegaly Pelvic Exam: EGBUS-normal, vagina-normal mucosa without lesions, cervix/uterus-surgically absent adnexae-no masses or tenderness Rectovaginal: Normal sphincter tone and  no masses  Lymphatic Exam: Non-palpable nodes in neck, clavicular, axillary, or inguinal regions Skin: No rash or abnormalities Extremities: no clubbing cyanosis or edema Neurologic: Grossly normal Psychiatric: Alert and oriented x 3    Assessment:   Routine GYN Exam   Plan:   STD testing  To have buttock augmentation 03/15/12-Dr. Yehuda Savannah  RTO 1 year or prn  Reviewed revised guidelines for PAP smear maintenance schedule.  Jarrett Chicoine,ELMIRAPA-C

## 2012-03-09 LAB — FOLLICLE STIMULATING HORMONE: FSH: 25 m[IU]/mL

## 2012-03-09 LAB — HIV ANTIBODY (ROUTINE TESTING W REFLEX): HIV: NONREACTIVE

## 2012-03-11 ENCOUNTER — Telehealth: Payer: Self-pay

## 2012-03-11 NOTE — Telephone Encounter (Signed)
Tc to pt regarding test results. Informed pt that her FSH indicates that she may be menopausel ,but in order to determine whether pt is completely menopausel, we need to repeat the [facility] for that determination. Also informed pt that her HIV results were wnl. Pt voiced understanding.

## 2012-03-11 NOTE — Telephone Encounter (Signed)
Message copied by Winfred Leeds on Mon Mar 11, 2012  8:37 AM ------      Message from: Henreitta Leber      Created: Sun Mar 10, 2012  9:46 PM       Please advise patient that her [facility] indicates that she might be menopausal as her number is borderline for menopause.  To be certain we need to repeat the level in 3 months and if it remains at that level or increases then it will be more certain that she is completely menopausal.  Thanks,  EP

## 2012-04-11 ENCOUNTER — Telehealth: Payer: Self-pay | Admitting: Obstetrics and Gynecology

## 2012-04-11 MED ORDER — METRONIDAZOLE 0.75 % VA GEL: VAGINAL | Status: DC

## 2012-04-11 NOTE — Telephone Encounter (Signed)
TC TO PT REGARDING MESSAGE. PT STATES HER PHARMACY DID NOT GET RX METRONIDAZOLE AND I CALLED PHARMACY AND THEY DID NOT SO CALLED IN RX FOR PT.

## 2012-04-19 ENCOUNTER — Other Ambulatory Visit: Payer: Self-pay | Admitting: Obstetrics and Gynecology

## 2012-04-19 ENCOUNTER — Telehealth: Payer: Self-pay | Admitting: Obstetrics and Gynecology

## 2012-04-19 MED ORDER — EST ESTROGENS-METHYLTEST 0.625-1.25 MG PO TABS: 1.0000 | ORAL_TABLET | Freq: Every day | ORAL | Status: DC

## 2012-04-19 NOTE — Telephone Encounter (Signed)
[place] ,    Do not forget to send rx for pt

## 2012-04-19 NOTE — Telephone Encounter (Signed)
VM from pt. Needs  Rf EStrogen/testosterone.  EP pt.  [phone]

## 2012-04-19 NOTE — Telephone Encounter (Signed)
Ep pt

## 2012-04-19 NOTE — Progress Notes (Signed)
Patient called to request a refill on her Estratest HS-granted to take qd with 5 refills  (maximum allowed for medications with testosterone).  Niclas Markell, PA-C

## 2012-04-19 NOTE — Progress Notes (Signed)
Pt aware rx e-pres to pharm

## 2012-04-23 ENCOUNTER — Telehealth: Payer: Self-pay

## 2012-04-23 NOTE — Telephone Encounter (Signed)
Advised pt to call pharmacy for rx. Pt voiced understanding.

## 2012-04-23 NOTE — Telephone Encounter (Signed)
Patient's calling for prescription that was sent last week.  Advise to check with pharmacy. Disa Riedlinger, PA-C

## 2012-04-26 ENCOUNTER — Other Ambulatory Visit: Payer: Self-pay | Admitting: Family Medicine

## 2012-04-26 NOTE — Telephone Encounter (Signed)
Needs office visit.

## 2012-06-09 ENCOUNTER — Other Ambulatory Visit: Payer: Self-pay | Admitting: Obstetrics and Gynecology

## 2012-06-11 ENCOUNTER — Telehealth: Payer: Self-pay

## 2012-06-11 NOTE — Telephone Encounter (Signed)
Faxed denial for Diflucan 150 mg Refill. Pt must be seen for eval to assess need for this med. Melody Comas A

## 2012-06-11 NOTE — Telephone Encounter (Signed)
Note to close enc. Destiny Beasley, Destiny Beasley  

## 2012-06-13 ENCOUNTER — Telehealth: Payer: Self-pay | Admitting: Obstetrics and Gynecology

## 2012-06-13 ENCOUNTER — Other Ambulatory Visit: Payer: Self-pay | Admitting: Obstetrics and Gynecology

## 2012-06-13 MED ORDER — FLUCONAZOLE 150 MG PO TABS: 150.0000 mg | ORAL_TABLET | Freq: Once | ORAL | Status: DC

## 2012-06-13 NOTE — Telephone Encounter (Signed)
Pt states yu know which rx she need refill on

## 2012-06-14 ENCOUNTER — Telehealth: Payer: Self-pay

## 2012-06-14 MED ORDER — FLUCONAZOLE 150 MG PO TABS: 150.0000 mg | ORAL_TABLET | Freq: Once | ORAL | Status: DC

## 2012-06-14 NOTE — Telephone Encounter (Signed)
Tc to pt regarding message. Pt want Diflucan for yeast. Per EP I informed pt that I can call in Rx this time,but if discharge continues pt will need to come in for appt. Pt voiced understanding.

## 2012-07-10 ENCOUNTER — Other Ambulatory Visit: Payer: Self-pay | Admitting: Family Medicine

## 2012-10-13 ENCOUNTER — Ambulatory Visit (INDEPENDENT_AMBULATORY_CARE_PROVIDER_SITE_OTHER): Payer: Managed Care, Other (non HMO) | Admitting: Emergency Medicine

## 2012-10-13 VITALS — BP 137/78 | HR 99 | Temp 99.4°F | Resp 18 | Ht 71.5 in | Wt 259.6 lb

## 2012-10-13 DIAGNOSIS — E119 Type 2 diabetes mellitus without complications: Secondary | ICD-10-CM

## 2012-10-13 DIAGNOSIS — B9689 Other specified bacterial agents as the cause of diseases classified elsewhere: Secondary | ICD-10-CM

## 2012-10-13 DIAGNOSIS — A499 Bacterial infection, unspecified: Secondary | ICD-10-CM

## 2012-10-13 DIAGNOSIS — N898 Other specified noninflammatory disorders of vagina: Secondary | ICD-10-CM

## 2012-10-13 DIAGNOSIS — N760 Acute vaginitis: Secondary | ICD-10-CM

## 2012-10-13 LAB — GLUCOSE, POCT (MANUAL RESULT ENTRY): POC Glucose: 141 mg/dl — AB (ref 70–99)

## 2012-10-13 LAB — POCT WET PREP WITH KOH
KOH Prep POC: NEGATIVE
Trichomonas, UA: NEGATIVE

## 2012-10-13 LAB — POCT CBC
Granulocyte percent: 45.8 %G (ref 37–80)
HCT, POC: 39.8 % (ref 37.7–47.9)
MCV: 93.6 fL (ref 80–97)
MID (cbc): 0.5 (ref 0–0.9)
POC Granulocyte: 2.6 (ref 2–6.9)
Platelet Count, POC: 299 10*3/uL (ref 142–424)
RBC: 4.25 M/uL (ref 4.04–5.48)

## 2012-10-13 MED ORDER — METRONIDAZOLE 500 MG PO TABS: 500.0000 mg | ORAL_TABLET | Freq: Two times a day (BID) | ORAL | Status: DC

## 2012-10-13 NOTE — Progress Notes (Signed)
  Subjective:    Patient ID: Destiny Beasley, female    DOB: December 30, 1955, 57 y.o.   MRN: [id number]  HPI Pt complains of vaginal discharge times one month. Denies abd or pelvic pain. Has one new sexual partner. Does not use condoms. Discharge is white with an odor. Also states she has had chills. She has a 99.4 fever here in the office today. She states her partner has not said anything about any sx's. She is diabetic. She states she gets bacterial infections frequently. She is followed by Dr Allena Katz in [facility] for her DM.    Review of Systems     Objective:   Physical Exam abdomen is flat. Liver and spleen are not enlarged. Pelvic exam reveals normal external genitalia there is a thick whitish L. discharge in the vault. There is no cervix present. There are no adnexal masses felt.  Results for orders placed in visit on 10/13/12  POCT CBC      Result Value Range   WBC 5.7  4.6 - 10.2 K/uL   Lymph, poc 2.6  0.6 - 3.4   POC LYMPH PERCENT 45.7  10 - 50 %L   MID (cbc) 0.5  0 - 0.9   POC MID % 8.5  0 - 12 %M   POC Granulocyte 2.6  2 - 6.9   Granulocyte percent 45.8  37 - 80 %G   RBC 4.25  4.04 - 5.48 M/uL   Hemoglobin 12.2  12.2 - 16.2 g/dL   HCT, POC 78.2  95.6 - 47.9 %   MCV 93.6  80 - 97 fL   MCH, POC 28.7  27 - 31.2 pg   MCHC 30.7 (*) 31.8 - 35.4 g/dL   RDW, POC 21.3     Platelet Count, POC 299  142 - 424 K/uL   MPV 8.4  0 - 99.8 fL  GLUCOSE, POCT (MANUAL RESULT ENTRY)      Result Value Range   POC Glucose 141 (*) 70 - 99 mg/dl  POCT WET PREP WITH KOH      Result Value Range   Trichomonas, UA Negative     Clue Cells Wet Prep HPF POC 8-12     Epithelial Wet Prep HPF POC 0-2     Yeast Wet Prep HPF POC neg     Bacteria Wet Prep HPF POC 2+     RBC Wet Prep HPF POC 0-1     WBC Wet Prep HPF POC 2-3     KOH Prep POC Negative          Assessment & Plan:  The patient's sugar is up at 141 she is a known diabetic. She definitely has BV URiprobe was obtained and will treat with  Flagyl 500 twice a day for one week to

## 2012-10-13 NOTE — Patient Instructions (Signed)
Bacterial Vaginosis Bacterial vaginosis (BV) is a vaginal infection where the normal balance of bacteria in the vagina is disrupted. The normal balance is then replaced by an overgrowth of certain bacteria. There are several different kinds of bacteria that can cause BV. BV is the most common vaginal infection in women of childbearing age. CAUSES   The cause of BV is not fully understood. BV develops when there is an increase or imbalance of harmful bacteria.  Some activities or behaviors can upset the normal balance of bacteria in the vagina and put women at increased risk including:  Having a new sex partner or multiple sex partners.  Douching.  Using an intrauterine device (IUD) for contraception.  It is not clear what role sexual activity plays in the development of BV. However, women that have never had sexual intercourse are rarely infected with BV. Women do not get BV from toilet seats, bedding, swimming pools or from touching objects around them.  SYMPTOMS   Grey vaginal discharge.  A fish-like odor with discharge, especially after sexual intercourse.  Itching or burning of the vagina and vulva.  Burning or pain with urination.  Some women have no signs or symptoms at all. DIAGNOSIS  Your caregiver must examine the vagina for signs of BV. Your caregiver will perform lab tests and look at the sample of vaginal fluid through a microscope. They will look for bacteria and abnormal cells (clue cells), a pH test higher than 4.5, and a positive amine test all associated with BV.  RISKS AND COMPLICATIONS   Pelvic inflammatory disease (PID).  Infections following gynecology surgery.  Developing HIV.  Developing herpes virus. TREATMENT  Sometimes BV will clear up without treatment. However, all women with symptoms of BV should be treated to avoid complications, especially if gynecology surgery is planned. Female partners generally do not need to be treated. However, BV may spread  between female sex partners so treatment is helpful in preventing a recurrence of BV.   BV may be treated with antibiotics. The antibiotics come in either pill or vaginal cream forms. Either can be used with nonpregnant or pregnant women, but the recommended dosages differ. These antibiotics are not harmful to the baby.  BV can recur after treatment. If this happens, a second round of antibiotics will often be prescribed.  Treatment is important for pregnant women. If not treated, BV can cause a premature delivery, especially for a pregnant woman who had a premature birth in the past. All pregnant women who have symptoms of BV should be checked and treated.  For chronic reoccurrence of BV, treatment with a type of prescribed gel vaginally twice a week is helpful. HOME CARE INSTRUCTIONS   Finish all medication as directed by your caregiver.  Do not have sex until treatment is completed.  Tell your sexual partner that you have a vaginal infection. They should see their caregiver and be treated if they have problems, such as a mild rash or itching.  Practice safe sex. Use condoms. Only have 1 sex partner. PREVENTION  Basic prevention steps can help reduce the risk of upsetting the natural balance of bacteria in the vagina and developing BV:  Do not have sexual intercourse (be abstinent).  Do not douche.  Use all of the medicine prescribed for treatment of BV, even if the signs and symptoms go away.  Tell your sex partner if you have BV. That way, they can be treated, if needed, to prevent reoccurrence. SEEK MEDICAL CARE IF:     Your symptoms are not improving after 3 days of treatment.  You have increased discharge, pain, or fever. MAKE SURE YOU:   Understand these instructions.  Will watch your condition.  Will get help right away if you are not doing well or get worse. FOR MORE INFORMATION  Division of STD Prevention (DSTDP), Centers for Disease Control and Prevention:  www.cdc.gov/std American Social Health Association (ASHA): www.ashastd.org  Document Released: 04/10/2005 Document Revised: 07/03/2011 Document Reviewed: 10/01/2008 ExitCare Patient Information 2014 [employer].  

## 2012-10-14 LAB — GC/CHLAMYDIA PROBE AMP
CT Probe RNA: NEGATIVE
GC Probe RNA: NEGATIVE

## 2012-12-10 ENCOUNTER — Other Ambulatory Visit: Payer: Self-pay | Admitting: Endocrinology

## 2013-01-24 ENCOUNTER — Other Ambulatory Visit: Payer: Self-pay | Admitting: Emergency Medicine

## 2013-02-27 ENCOUNTER — Other Ambulatory Visit: Payer: Self-pay | Admitting: Family Medicine

## 2013-02-27 DIAGNOSIS — M25562 Pain in left knee: Secondary | ICD-10-CM

## 2013-03-04 ENCOUNTER — Ambulatory Visit
Admission: RE | Admit: 2013-03-04 | Discharge: 2013-03-04 | Disposition: A | Discharge status: Home / Self Care | Payer: Managed Care, Other (non HMO) | Source: Ambulatory Visit | Attending: Family Medicine | Admitting: Family Medicine

## 2013-03-04 DIAGNOSIS — M25562 Pain in left knee: Secondary | ICD-10-CM

## 2013-03-09 ENCOUNTER — Other Ambulatory Visit: Payer: Self-pay

## 2013-04-22 ENCOUNTER — Encounter (HOSPITAL_COMMUNITY): Payer: Self-pay | Admitting: Emergency Medicine

## 2013-04-22 ENCOUNTER — Emergency Department (HOSPITAL_COMMUNITY)
Admission: EM | Admit: 2013-04-22 | Discharge: 2013-04-22 | Disposition: A | Discharge status: Home / Self Care | Payer: Managed Care, Other (non HMO) | Attending: Emergency Medicine | Admitting: Emergency Medicine

## 2013-04-22 DIAGNOSIS — F411 Generalized anxiety disorder: Secondary | ICD-10-CM | POA: Insufficient documentation

## 2013-04-22 DIAGNOSIS — Z79899 Other long term (current) drug therapy: Secondary | ICD-10-CM | POA: Insufficient documentation

## 2013-04-22 DIAGNOSIS — Z9889 Other specified postprocedural states: Secondary | ICD-10-CM | POA: Insufficient documentation

## 2013-04-22 DIAGNOSIS — Z794 Long term (current) use of insulin: Secondary | ICD-10-CM | POA: Insufficient documentation

## 2013-04-22 DIAGNOSIS — G8918 Other acute postprocedural pain: Secondary | ICD-10-CM | POA: Insufficient documentation

## 2013-04-22 DIAGNOSIS — I10 Essential (primary) hypertension: Secondary | ICD-10-CM | POA: Insufficient documentation

## 2013-04-22 DIAGNOSIS — E119 Type 2 diabetes mellitus without complications: Secondary | ICD-10-CM | POA: Insufficient documentation

## 2013-04-22 DIAGNOSIS — Z792 Long term (current) use of antibiotics: Secondary | ICD-10-CM | POA: Insufficient documentation

## 2013-04-22 DIAGNOSIS — E7800 Pure hypercholesterolemia, unspecified: Secondary | ICD-10-CM | POA: Insufficient documentation

## 2013-04-22 DIAGNOSIS — M7989 Other specified soft tissue disorders: Secondary | ICD-10-CM

## 2013-04-22 DIAGNOSIS — M25569 Pain in unspecified knee: Secondary | ICD-10-CM | POA: Insufficient documentation

## 2013-04-22 LAB — CBC
Hemoglobin: 13.9 g/dL (ref 12.0–15.0)
MCH: 30.3 pg (ref 26.0–34.0)
Platelets: 344 10*3/uL (ref 150–400)
RBC: 4.59 MIL/uL (ref 3.87–5.11)
WBC: 8.9 10*3/uL (ref 4.0–10.5)

## 2013-04-22 LAB — BASIC METABOLIC PANEL
CO2: 26 mEq/L (ref 19–32)
Chloride: 98 mEq/L (ref 96–112)
Glucose, Bld: 178 mg/dL — ABNORMAL HIGH (ref 70–99)
Potassium: 4.1 mEq/L (ref 3.7–5.3)
Sodium: 135 mEq/L — ABNORMAL LOW (ref 137–147)

## 2013-04-22 MED ORDER — ENOXAPARIN SODIUM 120 MG/0.8ML ~~LOC~~ SOLN
110.0000 mg | SUBCUTANEOUS | Status: AC
  Administered 2013-04-22: 110 mg via SUBCUTANEOUS
  Filled 2013-04-22: qty 0.8

## 2013-04-22 NOTE — ED Provider Notes (Signed)
CSN: [id number]     Arrival date & time 04/22/13  1944 History   First MD Initiated Contact with Patient 04/22/13 2056     Chief Complaint  Patient presents with  . Leg Swelling   (Consider location/radiation/quality/duration/timing/severity/associated sxs/prior Treatment) HPI Comments: TOTIANA EVERSON is a 57 year-old female with a past medical history of Left meniscal repair on 04/04/2013, presenting the Emergency Department with a chief complaint of Left leg swelling since this morning.  The patient reports lower extremity swelling and pain.  She denies fever, dyspnea, chills, cough, tachycardia, or palpitations.  She denies previous history of DVT/PE.  Denies anticoagulation or antiplatelet therapy at this time. Orthopedic surgeon: Dr. Turner Daniels   The history is provided by the patient and medical records. No language interpreter was used.    Past Medical History  Diagnosis Date  . Hypertension   . Diabetes mellitus   . Anxiety   . Hypercholesteremia    Past Surgical History  Procedure Laterality Date  . Cesarean section    . Abdominal hysterectomy    . Thyroid surgery    . Pituitary surgery    . Brain meningioma excision  2005   Family History  Problem Relation Age of Onset  . Diabetes Mother   . Hyperlipidemia Mother   . Hypertension Mother    History  Substance Use Topics  . Smoking status: Never Smoker   . Smokeless tobacco: Never Used  . Alcohol Use: 1.8 oz/week    3 Glasses of wine per week   OB History   Grav Para Term Preterm Abortions TAB SAB Ect Mult Living   4         2     Review of Systems  Allergies  Review of patient's allergies indicates no known allergies.  Home Medications   Current Outpatient Rx  Name  Route  Sig  Dispense  Refill  . azithromycin (ZITHROMAX) 250 MG tablet   Oral   Take 250 mg by mouth daily.          . cholecalciferol (VITAMIN D) 1000 UNITS tablet   Oral   Take 1,000 Units by mouth daily.         Marland Kitchen  estrogen-methylTESTOSTERone 0.625-1.25 MG per tablet   Oral   Take 1 tablet by mouth daily.   30 tablet   5   . insulin glargine (LANTUS) 100 UNIT/ML injection   Subcutaneous   Inject 40 Units into the skin at bedtime.          . insulin glulisine (APIDRA) 100 UNIT/ML injection   Subcutaneous   Inject 5-15 Units into the skin 2 (two) times daily. 5 units AM 15 units PM         . metformin (FORTAMET) 500 MG (OSM) 24 hr tablet   Oral   Take 500 mg by mouth daily with breakfast.         . Multiple Vitamin (MULITIVITAMIN WITH MINERALS) TABS   Oral   Take 1 tablet by mouth daily.           . Olmesartan-Amlodipine-HCTZ (TRIBENZOR) 40-10-25 MG TABS   Oral   Take 1 tablet by mouth daily.          . pantoprazole (PROTONIX) 40 MG tablet   Oral   Take 40 mg by mouth daily.          . simvastatin (ZOCOR) 20 MG tablet   Oral   Take 20 mg by mouth daily.         Marland Kitchen  venlafaxine (EFFEXOR-XR) 150 MG 24 hr capsule   Oral   Take 150 mg by mouth daily.            BP 132/77  Pulse 84  Temp(Src) 98.2 F (36.8 C) (Oral)  Resp 20  SpO2 97% Physical Exam  Nursing note and vitals reviewed. Constitutional: She is oriented to person, place, and time. She appears well-developed and well-nourished.  HENT:  Head: Normocephalic and atraumatic.  Neck: Neck supple.  Cardiovascular: Normal rate and regular rhythm.   No murmur heard. The patient was not tachycardic on exam.  Pulmonary/Chest: Effort normal and breath sounds normal. No respiratory distress. She has no decreased breath sounds. She has no wheezes. She has no rhonchi. She has no rales.  Abdominal: Soft.  Musculoskeletal: Normal range of motion.       Left knee: She exhibits normal range of motion and no erythema.  Two small linear scars healing , no erythema, no drainage. Full ROM. Positive Homans sign. Left lower extremity with moderate swelling, tender to palpation, no erythremia. Good cap refill, Sensation  intact, full ROm of ankle and toes.  Neurological: She is alert and oriented to person, place, and time.    ED Course  Procedures (including critical care time) Labs Review Labs Reviewed  CBC  BASIC METABOLIC PANEL   Imaging Review No results found.  EKG Interpretation   None       MDM   1. Leg swelling    Pt with a recent laparoscopic procedure to Left knee presents with unilateral swelling of lower leg.  Likely DVT. CBC, BMP within normal limits. Vascular lab is closed for the evening and will have the patient follow up as an out patient tomorrow.  Will give Lovenox per pharmacy recommendations. Discussed patient history, condition, and labs with Dr. Lynelle Doctor who agrees the patient can be evaluated as an out-pt. Discussed lab results, and treatment plan with the patient and patient's brother which includes vascular study tomorrow. Return precautions given. Reports understanding and no other concerns at this time.  Patient is stable for discharge at this time.  Meds given in ED:  Medications  enoxaparin (LOVENOX) injection 110 mg (110 mg Subcutaneous Given 04/22/13 2326)    Discharge Medication List as of 04/22/2013 10:31 PM          Leotis Shames Doretha Imus, PA-C 04/23/13 1538

## 2013-04-22 NOTE — ED Notes (Signed)
Pt states she had L knee surgery on 12 and she has since developed L calf swelling, redness. Sent from [facility] for possible blood clot.

## 2013-04-23 ENCOUNTER — Ambulatory Visit (HOSPITAL_COMMUNITY)
Admission: RE | Admit: 2013-04-23 | Discharge: 2013-04-23 | Disposition: A | Discharge status: Home / Self Care | Payer: Managed Care, Other (non HMO) | Source: Ambulatory Visit | Attending: Emergency Medicine | Admitting: Emergency Medicine

## 2013-04-23 DIAGNOSIS — M79609 Pain in unspecified limb: Secondary | ICD-10-CM | POA: Insufficient documentation

## 2013-04-23 NOTE — Progress Notes (Signed)
*  Preliminary Results* Left lower extremity venous duplex completed. Left lower extremity is negative for deep vein thrombosis. There is evidence of left Baker's cyst, there is also evidence of a collection of complex fluid extending down the proximal medial calf, suggestive of a possible ruptured Baker's cyst versus muscle tear.  04/23/2013 8:40 AM  Gertie Fey, RVT, RDCS, RDMS

## 2013-04-24 HISTORY — PX: MENISCUS REPAIR: SHX5179

## 2013-04-25 NOTE — ED Provider Notes (Signed)
Medical screening examination/treatment/procedure(s) were performed by non-physician practitioner and as supervising physician I was immediately available for consultation/collaboration.    Kathalene Frames, MD 04/25/13 [phone]

## 2013-05-29 ENCOUNTER — Telehealth: Payer: Self-pay

## 2013-05-29 NOTE — Telephone Encounter (Signed)
[facility] faxed Medicare form for DM supplies. Completed/signed and faxed.

## 2013-07-11 ENCOUNTER — Other Ambulatory Visit: Payer: Self-pay

## 2013-07-16 ENCOUNTER — Ambulatory Visit: Payer: Self-pay | Admitting: Endocrinology

## 2013-07-29 ENCOUNTER — Other Ambulatory Visit: Payer: Self-pay | Admitting: *Deleted

## 2013-07-29 ENCOUNTER — Other Ambulatory Visit (INDEPENDENT_AMBULATORY_CARE_PROVIDER_SITE_OTHER): Payer: Managed Care, Other (non HMO)

## 2013-07-29 DIAGNOSIS — E119 Type 2 diabetes mellitus without complications: Secondary | ICD-10-CM

## 2013-07-29 LAB — LIPID PANEL
Cholesterol: 135 mg/dL (ref 0–200)
HDL: 39.6 mg/dL (ref >39.00)
LDL Cholesterol: 80 mg/dL (ref 0–99)
Total CHOL/HDL Ratio: 3
Triglycerides: 76 mg/dL (ref 0.0–149.0)
VLDL: 15.2 mg/dL (ref 0.0–40.0)

## 2013-07-29 LAB — COMPREHENSIVE METABOLIC PANEL
ALT: 21 U/L (ref 0–35)
AST: 17 U/L (ref 0–37)
Albumin: 4 g/dL (ref 3.5–5.2)
Alkaline Phosphatase: 63 U/L (ref 39–117)
BUN: 16 mg/dL (ref 6–23)
CHLORIDE: 100 meq/L (ref 96–112)
CO2: 32 mEq/L (ref 19–32)
CREATININE: 0.9 mg/dL (ref 0.4–1.2)
Calcium: 9.4 mg/dL (ref 8.4–10.5)
GFR: 82.39 mL/min (ref >60.00)
Glucose, Bld: 233 mg/dL — ABNORMAL HIGH (ref 70–99)
Potassium: 3.7 mEq/L (ref 3.5–5.1)
SODIUM: 139 meq/L (ref 135–145)
Total Bilirubin: 0.4 mg/dL (ref 0.3–1.2)
Total Protein: 7.6 g/dL (ref 6.0–8.3)

## 2013-07-29 LAB — HEMOGLOBIN A1C: HEMOGLOBIN A1C: 10.7 % — AB (ref 4.6–6.5)

## 2013-08-01 ENCOUNTER — Ambulatory Visit (INDEPENDENT_AMBULATORY_CARE_PROVIDER_SITE_OTHER): Payer: Managed Care, Other (non HMO) | Admitting: Endocrinology

## 2013-08-01 ENCOUNTER — Encounter: Payer: Self-pay | Admitting: Endocrinology

## 2013-08-01 VITALS — BP 122/74 | HR 112 | Temp 98.2°F | Resp 16 | Ht 72.0 in | Wt 265.0 lb

## 2013-08-01 DIAGNOSIS — I10 Essential (primary) hypertension: Secondary | ICD-10-CM

## 2013-08-01 DIAGNOSIS — IMO0001 Reserved for inherently not codable concepts without codable children: Secondary | ICD-10-CM

## 2013-08-01 DIAGNOSIS — E1165 Type 2 diabetes mellitus with hyperglycemia: Principal | ICD-10-CM

## 2013-08-01 DIAGNOSIS — E785 Hyperlipidemia, unspecified: Secondary | ICD-10-CM

## 2013-08-01 MED ORDER — DAPAGLIFLOZIN PROPANEDIOL 5 MG PO TABS: 5.0000 mg | ORAL_TABLET | Freq: Every day | ORAL | Status: DC

## 2013-08-01 NOTE — Progress Notes (Signed)
Patient ID: KEYONA EMRICH, female   DOB: June 12, 1955, 58 y.o.   MRN: [id number]   Reason for Appointment: Type II Diabetes follow-up   History of Present Illness   Date of diagnosis: 10/2008  Previous history: She had markedly increased blood sugars at diagnosis and was tried on oral hypoglycemic drugs and Victoza for about 9 months before starting an insulin. Since 2011 she had been on basal bolus insulin regimen Previously had had difficulty controlling her diabetes mostly because of difficulty with compliance with various aspects of self-care. Did not consistently take her Apidra at mealtimes  Recent history: She apparently has had persistently poor control of her diabetes with an outside endocrinologist Also has had difficulty losing weight She was given a trial of Invokana earlier this year which she thinks improved her blood sugars tremendously but she thinks it caused muscle aches and has not taken any Also her blood sugars have been significantly higher periodically because of getting either steroid injections in her knees or recently Prednisone for asthmatic bronchitis over the last 2-3 days     Oral hypoglycemic drugs: Metformin 500 mg        Side effects from medications: ? myalgia from Invokana Insulin regimen: Lantus 40-45 at bedtime Apidra 20 units a.c. 3 times a day         Proper timing of medications in relation to meals: Yes, usually.          Monitors blood glucose: ?.    Glucometer:  unknown       Blood Glucose readings from recall: readings before breakfast: 200-330, was 174 upto 500 at night recently Hypoglycemia:  none         Meals: 3 meals per day.          Physical activity: exercise: Minimal           Dietician visit: Most recent: 04/2010     Weight control:  Wt Readings from Last 3 Encounters:  08/01/13 265 lb (120.203 kg)  04/22/13 242 lb (109.77 kg)  10/13/12 259 lb 9.6 oz (117.754 kg)          Complications:     Diabetes labs:  Lab Results   Component Value Date   HGBA1C 10.7* 07/29/2013   HGBA1C 10.9* 11/11/2011   Lab Results  Component Value Date   LDLCALC 80 07/29/2013   CREATININE 0.9 07/29/2013       Medication List       This list is accurate as of: 08/01/13  8:54 AM.  Always use your most recent med list.               azithromycin 250 MG tablet  Commonly known as:  ZITHROMAX  Take 250 mg by mouth daily.     cholecalciferol 1000 UNITS tablet  Commonly known as:  VITAMIN D  Take 1,000 Units by mouth daily.     estrogen-methylTESTOSTERone 0.625-1.25 MG per tablet  Take 1 tablet by mouth daily.     insulin glargine 100 UNIT/ML injection  Commonly known as:  LANTUS  Inject 40 Units into the skin at bedtime.     insulin glulisine 100 UNIT/ML injection  Commonly known as:  APIDRA  Inject 20 Units into the skin 2 (two) times daily. 20-20-20     metformin 500 MG (OSM) 24 hr tablet  Commonly known as:  FORTAMET  Take 500 mg by mouth daily with breakfast.     multivitamin with minerals Tabs tablet  Take 1  tablet by mouth daily.     pantoprazole 40 MG tablet  Commonly known as:  PROTONIX  Take 40 mg by mouth daily.     predniSONE 5 MG Tabs tablet  Commonly known as:  STERAPRED UNI-PAK  Take by mouth.     simvastatin 20 MG tablet  Commonly known as:  ZOCOR  Take 20 mg by mouth daily.     TRIBENZOR 40-10-25 MG Tabs  Generic drug:  Olmesartan-Amlodipine-HCTZ  Take 1 tablet by mouth daily.     venlafaxine XR 150 MG 24 hr capsule  Commonly known as:  EFFEXOR-XR  Take 150 mg by mouth daily.        Allergies: No Known Allergies  Past Medical History  Diagnosis Date  . Hypertension   . Diabetes mellitus   . Anxiety   . Hypercholesteremia     Past Surgical History  Procedure Laterality Date  . Cesarean section    . Abdominal hysterectomy    . Thyroid surgery    . Pituitary surgery    . Brain meningioma excision  2005    Family History  Problem Relation Age of Onset  . Diabetes  Mother   . Hyperlipidemia Mother   . Hypertension Mother     Social History:  reports that she has never smoked. She has never used smokeless tobacco. She reports that she drinks about 1.8 ounces of alcohol per week. She reports that she does not use illicit drugs.  Review of Systems:  Hypertension:  currently is taking Benicar HCT/amlodipine combination with good control   Lipids: Taking low dose simvastatin for some time  Lab Results  Component Value Date   CHOL 135 07/29/2013   HDL 39.60 07/29/2013   LDLCALC 80 07/29/2013   TRIG 76.0 07/29/2013   CHOLHDL 3 07/29/2013        LABS:  Appointment on 07/29/2013  Component Date Value Ref Range Status  . Hemoglobin A1C 07/29/2013 10.7* 4.6 - 6.5 % Final   Glycemic Control Guidelines for People with Diabetes:Non Diabetic:  <6%Goal of Therapy: <7%Additional Action Suggested:  >8%   . Sodium 07/29/2013 139  135 - 145 mEq/L Final  . Potassium 07/29/2013 3.7  3.5 - 5.1 mEq/L Final  . Chloride 07/29/2013 100  96 - 112 mEq/L Final  . CO2 07/29/2013 32  19 - 32 mEq/L Final  . Glucose, Bld 07/29/2013 233* 70 - 99 mg/dL Final  . BUN 07/29/2013 16  6 - 23 mg/dL Final  . Creatinine, Ser 07/29/2013 0.9  0.4 - 1.2 mg/dL Final  . Total Bilirubin 07/29/2013 0.4  0.3 - 1.2 mg/dL Final  . Alkaline Phosphatase 07/29/2013 63  39 - 117 U/L Final  . AST 07/29/2013 17  0 - 37 U/L Final  . ALT 07/29/2013 21  0 - 35 U/L Final  . Total Protein 07/29/2013 7.6  6.0 - 8.3 g/dL Final  . Albumin 07/29/2013 4.0  3.5 - 5.2 g/dL Final  . Calcium 07/29/2013 9.4  8.4 - 10.5 mg/dL Final  . GFR 07/29/2013 82.39  >60.00 mL/min Final  . Cholesterol 07/29/2013 135  0 - 200 mg/dL Final   ATP III Classification       Desirable:  < 200 mg/dL               Borderline High:  200 - 239 mg/dL          High:  > = 240 mg/dL  . Triglycerides 07/29/2013 76.0  0.0 - 149.0 mg/dL  Final   Normal:  <150 mg/dLBorderline High:  150 - 199 mg/dL  . HDL 07/29/2013 39.60  >39.00 mg/dL Final  .  VLDL 07/29/2013 15.2  0.0 - 40.0 mg/dL Final  . LDL Cholesterol 07/29/2013 80  0 - 99 mg/dL Final  . Total CHOL/HDL Ratio 07/29/2013 3   Final                  Men          Women1/2 Average Risk     3.4          3.3Average Risk          5.0          4.42X Average Risk          9.6          7.13X Average Risk          15.0          11.0                         Examination:   BP 122/74  Pulse 112  Temp(Src) 98.2 F (36.8 C)  Resp 16  Ht 6' (1.829 m)  Wt 265 lb (120.203 kg)  BMI 35.93 kg/m2  SpO2 94%  Body mass index is 35.93 kg/(m^2).   No pedal edema  ASSESSMENT/ PLAN:    Diabetes type 2:  Blood glucose control appears to be consistently poor for the last few years For her weight of 120 kg she is still not getting enough insulin with only 40 units of basal and 60 units mealtime coverage Currently only on 500 mg metformin and needs to maximize this Not clear if she had any true side effects from Invokana as myalgia is an unlikely symptom Currently does not appear to have any of his diabetic complications  Recommendations today:  Stay on 45 Lantus for now unless blood sugars in the mornings continue to stay high  Trial of Farxiga 5 mg daily in the morning, consider increasing to 10 mg bid  Metformin 1 in am and 2 pm  With Prednisone increase am Lantus to 50 and Apidra by 10-15 units each time glucose is over 200  Start using brand-name glucose monitor and check readings later in the morning or after meals, at least twice a day and bring monitor for download  Consider followup consultation with dietitian  Encouraged regular exercise  Review of outside records when available  Hypertension and hypercholesterolemia appear to be well controlled  Total visit time including counseling = 25 minutes  Ranell Finelli 08/01/2013, 8:54 AM

## 2013-08-01 NOTE — Patient Instructions (Addendum)
With Prednisone increase am Lantus to 50 and Apidra by 10-15 units Stay on 45 Lantus Metformin 1 in am and 2 pm  Farxiga 5mg  before Bfst  Please check blood sugars at least half the time about 2 hours after any meal and as directed on waking up. Please bring blood sugar monitor to each visit

## 2013-08-22 ENCOUNTER — Ambulatory Visit: Payer: Self-pay | Admitting: Endocrinology

## 2013-09-05 ENCOUNTER — Other Ambulatory Visit: Payer: Self-pay | Admitting: *Deleted

## 2013-09-05 ENCOUNTER — Encounter: Payer: Self-pay | Admitting: Endocrinology

## 2013-09-05 ENCOUNTER — Ambulatory Visit (INDEPENDENT_AMBULATORY_CARE_PROVIDER_SITE_OTHER): Payer: Managed Care, Other (non HMO) | Admitting: Endocrinology

## 2013-09-05 VITALS — BP 148/80 | HR 84 | Temp 98.2°F | Resp 16 | Ht 72.0 in | Wt 270.6 lb

## 2013-09-05 DIAGNOSIS — I10 Essential (primary) hypertension: Secondary | ICD-10-CM

## 2013-09-05 DIAGNOSIS — E1165 Type 2 diabetes mellitus with hyperglycemia: Principal | ICD-10-CM

## 2013-09-05 DIAGNOSIS — IMO0001 Reserved for inherently not codable concepts without codable children: Secondary | ICD-10-CM

## 2013-09-05 DIAGNOSIS — E785 Hyperlipidemia, unspecified: Secondary | ICD-10-CM

## 2013-09-05 MED ORDER — METFORMIN HCL ER (OSM) 500 MG PO TB24: 500.0000 mg | ORAL_TABLET | Freq: Every day | ORAL | Status: DC

## 2013-09-05 MED ORDER — LIRAGLUTIDE 18 MG/3ML ~~LOC~~ SOPN: PEN_INJECTOR | SUBCUTANEOUS | Status: DC

## 2013-09-05 NOTE — Progress Notes (Signed)
Patient ID: Destiny Beasley, female   DOB: 1955-12-02, 58 y.o.   MRN: [id number]   Reason for Appointment: Type II Diabetes follow-up   History of Present Illness   Date of diagnosis: 10/2008  Previous history: She had markedly increased blood sugars at diagnosis and was tried on oral hypoglycemic drugs and Victoza for about 9 months before starting an insulin. Since 2011 she had been on basal bolus insulin regimen Previously had had difficulty controlling her diabetes mostly because of difficulty with compliance with various aspects of self-care. Did not consistently take her Apidra at mealtimes     Recent history:  Because of persistent control she was given a trial of Farxiga in 4/15 but she stopped it after 2 weeks because  of candidiasis. Also tried Invokana for a few days of the same side effect Also was told to increase her metformin to 3 a day but has not done so since she did not have a new prescription Also has had difficulty losing weight and has only recently started back on exercise Glucose control: She was having relatively better readings with taking Wilder Glade but now has markedly increased readings up to over 400 at times Also she is occasionally taking extra insulin at bedtime for high readings which may cause overnight hypoglycemia; occasionally with this her morning readings are fairly good Blood sugars are recently high both fasting and late at night and she is not getting readings after meals or during the day     Oral hypoglycemic drugs: Metformin 500 mg        Side effects from medications: ? myalgia from [place]  Insulin regimen: Lantus 45 at bedtime Apidra 20 units a.c. 3 times a day         Proper timing of medications in relation to meals: Yes, usually.          Monitors blood glucose: ?.    Glucometer:  unknown       Blood Glucose readings from download  PREMEAL Breakfast Lunch  3 PM  Bedtime Overall  Glucose range:  72-317  ?   301   166-436    Median:   152      205    Hypoglycemia: Fasting readings in the 50s during the night, both preceded by high readings         Physical activity: exercise: Starting 3/7           Dietician visit: Most recent: 04/2010     Weight control:  Wt Readings from Last 3 Encounters:  09/05/13 270 lb 9.6 oz (122.743 kg)  08/01/13 265 lb (120.203 kg)  04/22/13 242 lb (109.77 kg)          Complications:     Diabetes labs:  Lab Results  Component Value Date   HGBA1C 10.7* 07/29/2013   HGBA1C 10.9* 11/11/2011   Lab Results  Component Value Date   LDLCALC 80 07/29/2013   CREATININE 0.9 07/29/2013       Medication List       This list is accurate as of: 09/05/13  4:13 PM.  Always use your most recent med list.               azithromycin 250 MG tablet  Commonly known as:  ZITHROMAX  Take 250 mg by mouth daily.     cholecalciferol 1000 UNITS tablet  Commonly known as:  VITAMIN D  Take 1,000 Units by mouth daily.     Dapagliflozin Propanediol 5  MG Tabs  Commonly known as:  FARXIGA  Take 5 mg by mouth daily before breakfast.     DULERA 100-5 MCG/ACT Aero  Generic drug:  mometasone-formoterol     estrogen-methylTESTOSTERone 0.625-1.25 MG per tablet  Take 1 tablet by mouth daily.     ibuprofen 600 MG tablet  Commonly known as:  ADVIL,MOTRIN     insulin glargine 100 UNIT/ML injection  Commonly known as:  LANTUS  Inject 35 Units into the skin at bedtime.     insulin glulisine 100 UNIT/ML injection  Commonly known as:  APIDRA  Inject 5-20 Units into the skin 2 (two) times daily. 5 units in am and 20 units with lunch     metformin 500 MG (OSM) 24 hr tablet  Commonly known as:  FORTAMET  Take 500 mg by mouth daily with breakfast.     multivitamin with minerals Tabs tablet  Take 1 tablet by mouth daily.     pantoprazole 40 MG tablet  Commonly known as:  PROTONIX  Take 40 mg by mouth daily.     predniSONE 5 MG Tabs tablet  Commonly known as:  STERAPRED UNI-PAK  Take by mouth.      simvastatin 20 MG tablet  Commonly known as:  ZOCOR  Take 20 mg by mouth daily.     TRIBENZOR 40-10-25 MG Tabs  Generic drug:  Olmesartan-Amlodipine-HCTZ  Take 1 tablet by mouth daily.     venlafaxine XR 150 MG 24 hr capsule  Commonly known as:  EFFEXOR-XR  Take 150 mg by mouth daily.        Allergies: No Known Allergies  Past Medical History  Diagnosis Date  . Hypertension   . Diabetes mellitus   . Anxiety   . Hypercholesteremia     Past Surgical History  Procedure Laterality Date  . Cesarean section    . Abdominal hysterectomy    . Thyroid surgery    . Pituitary surgery    . Brain meningioma excision  2005    Family History  Problem Relation Age of Onset  . Diabetes Mother   . Hyperlipidemia Mother   . Hypertension Mother     Social History:  reports that she has never smoked. She has never used smokeless tobacco. She reports that she drinks about 1.8 ounces of alcohol per week. She reports that she does not use illicit drugs.  Review of Systems:  Hypertension:  currently is taking Benicar HCT/amlodipine combination with good control   Lipids: Taking low dose simvastatin for some time with adequate control  Lab Results  Component Value Date   CHOL 135 07/29/2013   HDL 39.60 07/29/2013   LDLCALC 80 07/29/2013   TRIG 76.0 07/29/2013   CHOLHDL 3 07/29/2013    Has not had any recent steroids    LABS:  None   Examination:   BP 148/80  Pulse 84  Temp(Src) 98.2 F (36.8 C)  Resp 16  Ht 6' (1.829 m)  Wt 270 lb 9.6 oz (122.743 kg)  BMI 36.69 kg/m2  SpO2 95%  Body mass index is 36.69 kg/(m^2).   No pedal edema  ASSESSMENT/ PLAN:    Diabetes type 2:  Blood glucose control appears to be consistently poor  Currently is on basal bolus insulin regimen but most likely is not getting enough insulin to control her readings Also with her obesity is insulin resistant Other problems:  Only recently starting exercise and needs to do better  Only taking 500 mg  metformin  a day  Difficulty losing weight  Inconsistent diet with variable readings at night  May not be getting a 24-hour effect of Lantus since her evening readings are higher  Not able to judge how high her blood sugars are going after meals  Recommendations today:  Trial of twice a day Lantus to 25 units. May increase evening dose as discussed today to keep morning sugar below 130 Discussed with the patient the nature of GLP-1 drugs, the action on various organ systems, how they benefit blood glucose control, as well as the benefit of weight loss and  increase satiety . Explained possible side effects especially nausea and vomiting; discussed safety information in package insert. Described injection technique and dosage titration of Victoza  starting with 0.6 mg once a day at the same time for the first week and then increasing to 1.2 mg if no symptoms of nausea. Patient brochure on Victoza and co-pay card given  More readings at different times of the day as discussed, at least twice a day  Metformin 1 in am and 2 pm  Consider followup consultation with dietitian  A1c in about 2 months  For now will continue the same doses of Apidra but may need to reduce it if postprandial readings are relatively lower with starting Victoza and exercise regimen  No Apidra insulin at bedtime  Hypertension and hypercholesterolemia appear to be well controlled  Total visit time including counseling = 25 minutes  Elayne Snare 09/05/2013, 4:13 PM

## 2013-09-05 NOTE — Patient Instructions (Addendum)
Start VICTOZA injection with the sample pen once daily at the same time of the day.  Dial the dose to 0.6 mg for the first week.  You may  experience nausea in the first few days which usually gets better   After 1 week increase the dose to 1.2mg  daily if no nausea.  You may inject in the stomach, thigh or arm.   You will feel fullness of the stomach with starting the medication and should try to keep portions of food small.    Lantus 25 units twice daily  Apidra same unless sugars after meals are > 200  Please check blood sugars at least half the time about 2 hours after any meal and daily on waking up. Please bring blood sugar monitor to each visit

## 2013-09-07 DIAGNOSIS — I10 Essential (primary) hypertension: Secondary | ICD-10-CM | POA: Insufficient documentation

## 2013-10-10 ENCOUNTER — Other Ambulatory Visit: Payer: Self-pay

## 2013-10-17 ENCOUNTER — Ambulatory Visit: Payer: Self-pay | Admitting: Endocrinology

## 2013-10-22 ENCOUNTER — Other Ambulatory Visit: Payer: Self-pay | Admitting: Neurosurgery

## 2013-10-22 DIAGNOSIS — D320 Benign neoplasm of cerebral meninges: Secondary | ICD-10-CM

## 2013-11-06 ENCOUNTER — Ambulatory Visit
Admission: RE | Admit: 2013-11-06 | Discharge: 2013-11-06 | Disposition: A | Discharge status: Home / Self Care | Payer: Managed Care, Other (non HMO) | Source: Ambulatory Visit | Attending: Neurosurgery | Admitting: Neurosurgery

## 2013-11-06 DIAGNOSIS — D320 Benign neoplasm of cerebral meninges: Secondary | ICD-10-CM

## 2013-11-06 MED ORDER — GADOBENATE DIMEGLUMINE 529 MG/ML IV SOLN
9.0000 mL | Freq: Once | INTRAVENOUS | Status: AC | PRN
  Administered 2013-11-06: 9 mL via INTRAVENOUS

## 2013-12-09 ENCOUNTER — Telehealth: Payer: Self-pay | Admitting: Endocrinology

## 2013-12-09 ENCOUNTER — Other Ambulatory Visit: Payer: Self-pay | Admitting: *Deleted

## 2013-12-09 MED ORDER — INSULIN PEN NEEDLE 31G X 5 MM MISC: Status: DC

## 2013-12-09 NOTE — Telephone Encounter (Signed)
rx sent

## 2013-12-09 NOTE — Telephone Encounter (Signed)
The needles for the flex pen need to be called in please

## 2013-12-16 ENCOUNTER — Other Ambulatory Visit (INDEPENDENT_AMBULATORY_CARE_PROVIDER_SITE_OTHER): Payer: Managed Care, Other (non HMO)

## 2013-12-16 DIAGNOSIS — IMO0001 Reserved for inherently not codable concepts without codable children: Secondary | ICD-10-CM

## 2013-12-16 DIAGNOSIS — E1165 Type 2 diabetes mellitus with hyperglycemia: Principal | ICD-10-CM

## 2013-12-16 LAB — URINALYSIS, ROUTINE W REFLEX MICROSCOPIC
Bilirubin Urine: NEGATIVE
Hgb urine dipstick: NEGATIVE
KETONES UR: NEGATIVE
Leukocytes, UA: NEGATIVE
Nitrite: NEGATIVE
Specific Gravity, Urine: 1.02 (ref 1.000–1.030)
Total Protein, Urine: NEGATIVE
UROBILINOGEN UA: 1 (ref 0.0–1.0)
Urine Glucose: NEGATIVE
pH: 5.5 (ref 5.0–8.0)

## 2013-12-16 LAB — BASIC METABOLIC PANEL
BUN: 12 mg/dL (ref 6–23)
CO2: 30 mEq/L (ref 19–32)
Calcium: 9.1 mg/dL (ref 8.4–10.5)
Chloride: 101 mEq/L (ref 96–112)
Creatinine, Ser: 1 mg/dL (ref 0.4–1.2)
GFR: 76.43 mL/min (ref >60.00)
Glucose, Bld: 202 mg/dL — ABNORMAL HIGH (ref 70–99)
Potassium: 3.7 mEq/L (ref 3.5–5.1)
Sodium: 138 mEq/L (ref 135–145)

## 2013-12-16 LAB — MICROALBUMIN / CREATININE URINE RATIO
CREATININE, U: 392.4 mg/dL
Microalb Creat Ratio: 0.3 mg/g (ref 0.0–30.0)
Microalb, Ur: 1.1 mg/dL (ref 0.0–1.9)

## 2013-12-19 ENCOUNTER — Ambulatory Visit: Payer: Self-pay | Admitting: Endocrinology

## 2013-12-19 LAB — FRUCTOSAMINE: FRUCTOSAMINE: 343 umol/L — AB (ref 190–270)

## 2013-12-23 ENCOUNTER — Ambulatory Visit (INDEPENDENT_AMBULATORY_CARE_PROVIDER_SITE_OTHER): Payer: Managed Care, Other (non HMO) | Admitting: Endocrinology

## 2013-12-23 ENCOUNTER — Encounter: Payer: Self-pay | Admitting: Endocrinology

## 2013-12-23 VITALS — BP 128/76 | HR 96 | Temp 98.1°F | Resp 14 | Ht 72.0 in | Wt 263.6 lb

## 2013-12-23 DIAGNOSIS — I10 Essential (primary) hypertension: Secondary | ICD-10-CM

## 2013-12-23 DIAGNOSIS — IMO0001 Reserved for inherently not codable concepts without codable children: Secondary | ICD-10-CM

## 2013-12-23 DIAGNOSIS — E1165 Type 2 diabetes mellitus with hyperglycemia: Principal | ICD-10-CM

## 2013-12-23 DIAGNOSIS — E785 Hyperlipidemia, unspecified: Secondary | ICD-10-CM

## 2013-12-23 MED ORDER — METFORMIN HCL 500 MG PO TABS: 500.0000 mg | ORAL_TABLET | Freq: Two times a day (BID) | ORAL | Status: DC

## 2013-12-23 NOTE — Progress Notes (Signed)
Patient ID: Destiny Beasley, female   DOB: 08-21-1955, 58 y.o.   MRN: [id number]   Reason for Appointment: Type II Diabetes follow-up   History of Present Illness   Date of diagnosis: 10/2008  Previous history: She had markedly increased blood sugars at diagnosis and was tried on oral hypoglycemic drugs and Victoza for about 9 months before starting an insulin.  Since 2011 she had been on basal bolus insulin regimen Previously had had difficulty controlling her diabetes mostly because of difficulty with compliance with various aspects of self-care.  Did not consistently take her Apidra at mealtimes  Recent history:  A1c has been over 10% the last 2 times    She did not tolerate [place] and Invokana because of candidiasis and possible myalgia In 5/15 she was started on Victoza which she has taken using 1.2 mg in the mornings However she did not followup as scheduled She thinks the Victoza is helping her blood sugar control but her fructosamine is significantly high and her home blood sugars are averaging nearly 200 as on the last visit She has however lost 7 pounds She also thinks that metformin ER causes more GI side effects and metformin and has not taken any for the last month or so Insulin: On her own she has increased her dosage to 60 units at night. However she did not try doing this twice a day as directed on her last visit when she was taking 45 units once a day Apidra: She usually is not eating breakfast and taking this only at lunch and dinner Currently not able to exercise much because of knee joint problems Glucose patterns: Her fasting readings are mostly good although has had a couple of high readings She was having relatively better readings with taking [place] but now has markedly increased readings up to over 400 at times Also she is occasionally taking extra insulin at bedtime for high readings which may cause overnight hypoglycemia; occasionally with this her morning  readings are fairly good Blood sugars are recently high both fasting and late at night and she is not getting readings after meals or during the day     Oral hypoglycemic drugs: not on Metformin 500 mg        Side effects from medications: ? myalgia from Invokana & Farxiga  Insulin regimen: Lantus 60 at bedtime Apidra 20 units a.c. 3 times a day         Proper timing of medications in relation to meals: Yes, usually.          Monitors blood glucose: ?.    Glucometer:  One Touch       Blood Glucose readings from download  PREMEAL Breakfast Lunch Dinner Bedtime Overall  Glucose range:  116-310   96-235 93-328  80-328   Mean/median: 156 240   198   Hypoglycemia: none      Physical activity: exercise: none           Dietician visit: Most recent: 04/2010     Weight control:  Wt Readings from Last 3 Encounters:  12/23/13 263 lb 9.6 oz (119.568 kg)  09/05/13 270 lb 9.6 oz (122.743 kg)  08/01/13 265 lb (120.203 kg)         Diabetes labs:  Fructosamine level is 343 in 8/15  Lab Results  Component Value Date   HGBA1C 10.7* 07/29/2013   HGBA1C 10.9* 11/11/2011   Lab Results  Component Value Date   MICROALBUR 1.1 12/16/2013   [place]  80 07/29/2013   CREATININE 1.0 12/16/2013       Medication List       This list is accurate as of: 12/23/13 10:05 AM.  Always use your most recent med list.               DULERA 100-5 MCG/ACT Aero  Generic drug:  mometasone-formoterol     ibuprofen 600 MG tablet  Commonly known as:  ADVIL,MOTRIN     insulin glargine 100 UNIT/ML injection  Commonly known as:  LANTUS  Inject 50 Units into the skin at bedtime.     insulin glulisine 100 UNIT/ML injection  Commonly known as:  APIDRA  Inject 5-20 Units into the skin 2 (two) times daily. 5 units in am and 20 units with lunch     Insulin Pen Needle 31G X 5 MM Misc  Commonly known as:  B-D UF III MINI PEN NEEDLES  Use 4 per day     Liraglutide 18 MG/3ML Sopn  Commonly known as:  VICTOZA  Inject  1.2 mg subcutaneously daily     metformin 500 MG (OSM) 24 hr tablet  Commonly known as:  FORTAMET  Take 1 tablet (500 mg total) by mouth daily with breakfast. Take 1 tablet in the morning and 2 tablets in the evening     multivitamin with minerals Tabs tablet  Take 1 tablet by mouth daily.     pantoprazole 40 MG tablet  Commonly known as:  PROTONIX  Take 40 mg by mouth daily.     TRIBENZOR 40-10-25 MG Tabs  Generic drug:  Olmesartan-Amlodipine-HCTZ  Take 1 tablet by mouth daily.     venlafaxine XR 150 MG 24 hr capsule  Commonly known as:  EFFEXOR-XR  Take 150 mg by mouth daily.        Allergies: No Known Allergies  Past Medical History  Diagnosis Date  . Hypertension   . Diabetes mellitus   . Anxiety   . Hypercholesteremia     Past Surgical History  Procedure Laterality Date  . Cesarean section    . Abdominal hysterectomy    . Thyroid surgery    . Pituitary surgery    . Brain meningioma excision  2005    Family History  Problem Relation Age of Onset  . Diabetes Mother   . Hyperlipidemia Mother   . Hypertension Mother     Social History:  reports that she has never smoked. She has never used smokeless tobacco. She reports that she drinks about 1.8 ounces of alcohol per week. She reports that she does not use illicit drugs.  Review of Systems:  Hypertension:  currently is taking Benicar HCT/amlodipine combination with good control   Lipids: Taking low dose simvastatin for some time with adequate control  Lab Results  Component Value Date   CHOL 135 07/29/2013   HDL 39.60 07/29/2013   LDLCALC 80 07/29/2013   TRIG 76.0 07/29/2013   CHOLHDL 3 07/29/2013    Has not had any recent steroids, had arthroscopy    LABS:  None   Examination:   BP 128/76  Pulse 96  Temp(Src) 98.1 F (36.7 C)  Resp 14  Ht 6' (1.829 m)  Wt 263 lb 9.6 oz (119.568 kg)  BMI 35.74 kg/m2  SpO2 96%  Body mass index is 35.74 kg/(m^2).   No pedal edema  ASSESSMENT/ PLAN:     Diabetes type 2:  Blood glucose control appears to be again poor  Although her fructosamine is high  it is relatively good compared to her previous A1c of over 10% See history of present illness for detailed discussion on her current blood sugar patterns, problems identified and management She has not followed up consistently and has not tried to take the Lantus twice a day as discussed before May not be getting a 24-hour effect of Lantus since her evening readings are higher Also she is not taking her metformin for the last month, not clear how much she has benefited from this Her highest blood sugars are around lunchtime and sporadically later at night and she cannot explain these  Recommendations today:  Trial of twice a day Lantus using 30 units. May increase evening dose as discussed today to keep morning sugar below 130  She can take 5-10 units of Apidra to cover her late evening snack  since apparently her morning sugars are higher when she does this   Needs to adjust her suppertime Apidra dosage based on meal size  May need to take 5-10 units of insulin more when she had a steroid injection in her knee  Start metformin and increase the dose gradually as titrated  Continue Victoza  Start exercise when she can  Check A1c on the next visit and encouraged her to follow him regularly  Hypertension and hypercholesterolemia appear to be well controlled  Patient Instructions  Lantus 30 twice daily, try taking 25 apidra at supper, sugars 2 hrs after a meal should be <180  May take 5-10 Apidra for night snack  Adjust pm Lantus based on AM SUGAR TREND    Total visit time including counseling = 25 minutes  Tait Balistreri 12/23/2013, 10:05 AM

## 2013-12-23 NOTE — Patient Instructions (Addendum)
Lantus 30 twice daily, try taking 25 apidra at supper, sugars 2 hrs after a meal should be <180  May take 5-10 Apidra for night snack  Adjust pm Lantus based on AM SUGAR TREND

## 2013-12-26 ENCOUNTER — Telehealth: Payer: Self-pay | Admitting: Endocrinology

## 2013-12-26 ENCOUNTER — Other Ambulatory Visit: Payer: Self-pay | Admitting: *Deleted

## 2013-12-26 MED ORDER — GLUCOSE BLOOD VI STRP: ORAL_STRIP | Status: DC

## 2013-12-26 NOTE — Telephone Encounter (Signed)
pt needs rx for one touch verio test strips please

## 2013-12-26 NOTE — Telephone Encounter (Signed)
rx sent

## 2014-01-01 ENCOUNTER — Other Ambulatory Visit: Payer: Self-pay | Admitting: *Deleted

## 2014-01-01 MED ORDER — GLUCOSE BLOOD VI STRP: ORAL_STRIP | Status: DC

## 2014-01-09 ENCOUNTER — Telehealth: Payer: Self-pay | Admitting: *Deleted

## 2014-01-09 ENCOUNTER — Encounter: Payer: Self-pay | Admitting: *Deleted

## 2014-01-22 ENCOUNTER — Telehealth: Payer: Self-pay | Admitting: Endocrinology

## 2014-01-22 ENCOUNTER — Other Ambulatory Visit: Payer: Self-pay | Admitting: *Deleted

## 2014-01-22 MED ORDER — INSULIN GLARGINE 100 UNIT/ML SOLOSTAR PEN: 30.0000 [IU] | PEN_INJECTOR | Freq: Two times a day (BID) | SUBCUTANEOUS | Status: DC

## 2014-01-22 MED ORDER — INSULIN GLARGINE 100 UNIT/ML ~~LOC~~ SOLN: SUBCUTANEOUS | Status: DC

## 2014-01-22 NOTE — Telephone Encounter (Signed)
Pt needs lantus to be refilled 30 u in am and pm daily

## 2014-01-22 NOTE — Telephone Encounter (Signed)
Patient stated that she need pins needles called into her pharmacy for her Lantus, not the vials.

## 2014-01-29 ENCOUNTER — Other Ambulatory Visit (INDEPENDENT_AMBULATORY_CARE_PROVIDER_SITE_OTHER): Payer: Managed Care, Other (non HMO)

## 2014-01-29 DIAGNOSIS — E1165 Type 2 diabetes mellitus with hyperglycemia: Secondary | ICD-10-CM

## 2014-01-29 DIAGNOSIS — IMO0002 Reserved for concepts with insufficient information to code with codable children: Secondary | ICD-10-CM

## 2014-01-29 LAB — HEMOGLOBIN A1C: Hgb A1c MFr Bld: 10.3 % — ABNORMAL HIGH (ref 4.6–6.5)

## 2014-01-29 LAB — BASIC METABOLIC PANEL
BUN: 16 mg/dL (ref 6–23)
CO2: 34 mEq/L — ABNORMAL HIGH (ref 19–32)
Calcium: 9.3 mg/dL (ref 8.4–10.5)
Chloride: 103 mEq/L (ref 96–112)
Creatinine, Ser: 1 mg/dL (ref 0.4–1.2)
GFR: 72.92 mL/min (ref >60.00)
GLUCOSE: 135 mg/dL — AB (ref 70–99)
POTASSIUM: 3.9 meq/L (ref 3.5–5.1)
SODIUM: 141 meq/L (ref 135–145)

## 2014-02-03 ENCOUNTER — Ambulatory Visit: Payer: Self-pay | Admitting: Endocrinology

## 2014-02-11 ENCOUNTER — Ambulatory Visit (INDEPENDENT_AMBULATORY_CARE_PROVIDER_SITE_OTHER): Payer: Managed Care, Other (non HMO) | Admitting: Endocrinology

## 2014-02-11 ENCOUNTER — Other Ambulatory Visit: Payer: Self-pay | Admitting: *Deleted

## 2014-02-11 ENCOUNTER — Encounter: Payer: Self-pay | Admitting: Endocrinology

## 2014-02-11 VITALS — BP 110/68 | HR 95 | Temp 97.7°F | Resp 16 | Ht 72.0 in | Wt 267.0 lb

## 2014-02-11 DIAGNOSIS — IMO0002 Reserved for concepts with insufficient information to code with codable children: Secondary | ICD-10-CM | POA: Insufficient documentation

## 2014-02-11 DIAGNOSIS — E1165 Type 2 diabetes mellitus with hyperglycemia: Secondary | ICD-10-CM

## 2014-02-11 DIAGNOSIS — I10 Essential (primary) hypertension: Secondary | ICD-10-CM

## 2014-02-11 MED ORDER — INSULIN GLARGINE 300 UNIT/ML ~~LOC~~ SOPN: 70.0000 [IU] | PEN_INJECTOR | Freq: Every day | SUBCUTANEOUS | Status: DC

## 2014-02-11 MED ORDER — FLUCONAZOLE 150 MG PO TABS: 150.0000 mg | ORAL_TABLET | Freq: Once | ORAL | Status: DC

## 2014-02-11 MED ORDER — CANAGLIFLOZIN 100 MG PO TABS: ORAL_TABLET | ORAL | Status: DC

## 2014-02-11 NOTE — Progress Notes (Signed)
Patient ID: Destiny Beasley, female   DOB: 1955/06/21, 58 y.o.   MRN: [id number]   Reason for Appointment: Type II Diabetes follow-up   History of Present Illness   Date of diagnosis: 10/2008  Previous history: She had markedly increased blood sugars at diagnosis and was tried on oral hypoglycemic drugs and Victoza for about 9 months before starting an insulin.  Since 2011 she had been on basal bolus insulin regimen Previously had had difficulty controlling her diabetes mostly because of difficulty with compliance with various aspects of self-care.  Did not consistently take her Apidra at mealtimes  Recent history:  Since her evening readings were higher in 9/15 her Lantus was split to twice a day She is taking 30 units twice a day but her blood sugars appear to be overall higher now including in the morning She thinks she did have a steroid injection a few days ago and glucose was as high as 466 last week and However she has had readings over 350 before the steroid also She is checking her blood sugars primarily in the mornings and difficult to know what her postprandial readings are She did go up on her Victoza up to 1.8 about 2 days ago on her own and she thinks it does help portion control A1c has been over 10% the last 2 times    She did not tolerate [place] and Invokana because of candidiasis and possible myalgia She also thinks that metformin ER causes more GI side effects than metformin and has not taken any  Mealtime insulin: She usually is not eating breakfast and taking this only at lunch and dinner Currently not able to exercise much because of knee joint problems Glucose patterns: Her fasting readings are mostly high with only a couple of good readings Has only 2 readings in the afternoon which was high yesterday and on the first of this month Blood sugars at night range from 63-358, some after taking Apidra     Oral hypoglycemic drugs: None, not on Metformin         Side  effects from medications: ? myalgia from Invokana & Farxiga  Insulin regimen: Lantus 30 bid at bedtime Apidra 20-30 units a.c. 3 times a day         Proper timing of medications in relation to meals: Yes, usually.          Monitors blood glucose: ?.    Glucometer:  One Touch       Blood Glucose readings from download  PREMEAL Breakfast Lunch  4-5 PM  Bedtime Overall  Glucose range:  131-404  ?   75, 415   63-358    Mean/median:        Hypoglycemia: none      Physical activity: exercise: just starting           Dietician visit: Most recent: 04/2010     Weight control:  Wt Readings from Last 3 Encounters:  02/11/14 267 lb (121.11 kg)  12/23/13 263 lb 9.6 oz (119.568 kg)  09/05/13 270 lb 9.6 oz (122.743 kg)         Diabetes labs:  Fructosamine level is 343 in 8/15  Lab Results  Component Value Date   HGBA1C 10.3* 01/29/2014   HGBA1C 10.7* 07/29/2013   HGBA1C 10.9* 11/11/2011   Lab Results  Component Value Date   MICROALBUR 1.1 12/16/2013   LDLCALC 80 07/29/2013   CREATININE 1.0 01/29/2014       Medication List  This list is accurate as of: 02/11/14 11:59 PM.  Always use your most recent med list.               Canagliflozin 100 MG Tabs  Commonly known as:  INVOKANA  Take 1 tablet by mouth daily     DULERA 100-5 MCG/ACT Aero  Generic drug:  mometasone-formoterol     EST ESTROGENS-METHYLTEST HS 0.625-1.25 MG per tablet  Generic drug:  estrogen-methylTESTOSTERone     fluconazole 150 MG tablet  Commonly known as:  DIFLUCAN  Take 1 tablet (150 mg total) by mouth once.     glucose blood test strip  Commonly known as:  ONETOUCH VERIO  Use as instructed to check blood sugar 3 times  a day dx code 250.00     ibuprofen 600 MG tablet  Commonly known as:  ADVIL,MOTRIN     Insulin Glargine 300 UNIT/ML Sopn  Commonly known as:  TOUJEO SOLOSTAR  Inject 70 Units into the skin daily.     insulin glulisine 100 UNIT/ML injection  Commonly known as:  APIDRA  Inject  5-20 Units into the skin 2 (two) times daily. 5 units in am and 20 units with lunch     Insulin Pen Needle 31G X 5 MM Misc  Commonly known as:  B-D UF III MINI PEN NEEDLES  Use 4 per day     Liraglutide 18 MG/3ML Sopn  Commonly known as:  VICTOZA  Inject 1.2 mg subcutaneously daily     multivitamin with minerals Tabs tablet  Take 1 tablet by mouth daily.     pantoprazole 40 MG tablet  Commonly known as:  PROTONIX  Take 40 mg by mouth daily.     simvastatin 20 MG tablet  Commonly known as:  ZOCOR     TRIBENZOR 40-10-25 MG Tabs  Generic drug:  Olmesartan-Amlodipine-HCTZ  Take 1 tablet by mouth daily.     valACYclovir 1000 MG tablet  Commonly known as:  VALTREX     venlafaxine XR 150 MG 24 hr capsule  Commonly known as:  EFFEXOR-XR  Take 150 mg by mouth daily.        Allergies: No Known Allergies  Past Medical History  Diagnosis Date  . Hypertension   . Diabetes mellitus   . Anxiety   . Hypercholesteremia     Past Surgical History  Procedure Laterality Date  . Cesarean section    . Abdominal hysterectomy    . Thyroid surgery    . Pituitary surgery    . Brain meningioma excision  2005    Family History  Problem Relation Age of Onset  . Diabetes Mother   . Hyperlipidemia Mother   . Hypertension Mother     Social History:  reports that she has never smoked. She has never used smokeless tobacco. She reports that she drinks about 1.8 ounces of alcohol per week. She reports that she does not use illicit drugs.  Review of Systems:  Hypertension:  currently is taking Benicar HCT/amlodipine combination with good control   Lipids: Taking low dose simvastatin for some time with adequate control  Lab Results  Component Value Date   CHOL 135 07/29/2013   HDL 39.60 07/29/2013   LDLCALC 80 07/29/2013   TRIG 76.0 07/29/2013   CHOLHDL 3 07/29/2013    Has not had any recent steroids, had arthroscopy    LABS:  None   Examination:   BP 110/68  Pulse 95  Temp(Src)  97.7 F (36.5 C)  Resp 16  Ht 6' (1.829 m)  Wt 267 lb (121.11 kg)  BMI 36.20 kg/m2  SpO2 99%  Body mass index is 36.2 kg/(m^2).   No pedal edema  ASSESSMENT/ PLAN:    Diabetes type 2:  Blood glucose control appears to be again poor and not clear why her sugars are higher including in the morning without reducing her insulin, no benefit from using Lantus twice a day She appears to be significantly insulin resistant, is not taking metformin because of diarrhea Also not taking blood sugar much after breakfast and lunch and difficult to know if her sugars are higher in the afternoon from inadequate basal insulin also Hemoglobin A1c is only slightly better at 10.3  Recommendations today:  Trial of Toujeo starting with 70 units, given detailed information on this and how to adjust the dose based on fasting readings  She agrees to try Invokana again and will also have her keep Diflucan handy if needed; may need to reduce insulin if this is effective  Consider increasing mealtime insulin if needed  Continue Victoza 1.8 mg  Start exercise when she can  Hypertension appears to be well controlled, may need to reduce her antihypertensive with Invokana if blood pressure is lower, she will watch blood pressure at home  Patient Instructions  Please check blood sugars at least half the time about 2 hours after any meal and 6  times per week on waking up. Please bring blood sugar monitor to each visit  Toujeo 70 once daily   Total visit time including counseling = 25 minutes  Gerber Penza 02/12/2014, 2:20 PM

## 2014-02-11 NOTE — Patient Instructions (Signed)
Please check blood sugars at least half the time about 2 hours after any meal and 6  times per week on waking up. Please bring blood sugar monitor to each visit  Toujeo 70 once daily

## 2014-02-15 ENCOUNTER — Other Ambulatory Visit: Payer: Self-pay | Admitting: Endocrinology

## 2014-02-23 ENCOUNTER — Encounter: Payer: Self-pay | Admitting: Endocrinology

## 2014-03-26 ENCOUNTER — Other Ambulatory Visit: Payer: Self-pay | Admitting: Endocrinology

## 2014-03-26 ENCOUNTER — Other Ambulatory Visit (INDEPENDENT_AMBULATORY_CARE_PROVIDER_SITE_OTHER): Payer: Managed Care, Other (non HMO)

## 2014-03-26 DIAGNOSIS — IMO0002 Reserved for concepts with insufficient information to code with codable children: Secondary | ICD-10-CM

## 2014-03-26 DIAGNOSIS — E1165 Type 2 diabetes mellitus with hyperglycemia: Secondary | ICD-10-CM

## 2014-03-26 LAB — BASIC METABOLIC PANEL
BUN: 13 mg/dL (ref 6–23)
CALCIUM: 9.3 mg/dL (ref 8.4–10.5)
CHLORIDE: 101 meq/L (ref 96–112)
CO2: 30 meq/L (ref 19–32)
Creatinine, Ser: 1.1 mg/dL (ref 0.4–1.2)
GFR: 69.68 mL/min (ref >60.00)
Glucose, Bld: 182 mg/dL — ABNORMAL HIGH (ref 70–99)
Potassium: 3.8 mEq/L (ref 3.5–5.1)
SODIUM: 140 meq/L (ref 135–145)

## 2014-03-30 ENCOUNTER — Encounter: Payer: Self-pay | Admitting: Endocrinology

## 2014-03-30 ENCOUNTER — Ambulatory Visit (INDEPENDENT_AMBULATORY_CARE_PROVIDER_SITE_OTHER): Payer: Managed Care, Other (non HMO) | Admitting: Endocrinology

## 2014-03-30 VITALS — BP 140/78 | HR 98 | Temp 97.8°F | Resp 14 | Ht 72.0 in | Wt 271.8 lb

## 2014-03-30 DIAGNOSIS — I10 Essential (primary) hypertension: Secondary | ICD-10-CM

## 2014-03-30 DIAGNOSIS — IMO0002 Reserved for concepts with insufficient information to code with codable children: Secondary | ICD-10-CM

## 2014-03-30 DIAGNOSIS — R252 Cramp and spasm: Secondary | ICD-10-CM

## 2014-03-30 DIAGNOSIS — E1165 Type 2 diabetes mellitus with hyperglycemia: Secondary | ICD-10-CM

## 2014-03-30 LAB — MAGNESIUM: Magnesium: 2 mg/dL (ref 1.5–2.5)

## 2014-03-30 LAB — FRUCTOSAMINE: Fructosamine: 312 umol/L — ABNORMAL HIGH (ref 190–270)

## 2014-03-30 NOTE — Patient Instructions (Signed)
Try Invokana 1/2 for 3 days then 1 pill daily  Toujeo 70 units at night and keep am sugars in 90-130 range by adjusting every days by 5 units  Please check blood sugars at least half the time about 2 hours after any meal and 4x per week on waking up. Please bring blood sugar monitor to each visit

## 2014-03-30 NOTE — Progress Notes (Signed)
Patient ID: Destiny Beasley, female   DOB: [birth year], 58 y.o.   MRN: [id number]   Reason for Appointment: Type II Diabetes follow-up   History of Present Illness   Date of diagnosis: 10/2008  Previous history: She had markedly increased blood sugars at diagnosis and was tried on oral hypoglycemic drugs and Victoza for about 9 months before starting an insulin.  Since 2011 she had been on basal bolus insulin regimen Previously had had difficulty controlling her diabetes mostly because of difficulty with compliance with various aspects of self-care.  Did not consistently take her Apidra at mealtimes  Recent history:  Her blood sugars are inconsistently controlled again although her fructosamine is slightly better than 3 months ago She does not follow instructions for her medications and insulin and usually is doing arbitrary management Current problems:  She had been told to take Lantus twice a day but she is taking it only once a day again resulting in higher readings in the evenings  She was given Toujeo to take on her last visit but when she took this she was having gastroenteritis and blamed it on the insulin and did not continue it  She says she is very inconsistent with the diet and sometimes will eat junk food causing high readingsespecially on Thanksgiving weekend when blood sugar was 425  She may sometimes have high readings after her evening meals and snacks and then will take extra Apidra insulin during the night.  She thinks that her blood sugar readings of 80-90 was make her feel hypoglycemic and this may occasionally occur during the night  She did not start Invokana as directed because of bad publicity on the Internet  Again has difficulty losing weight  She was taking 1.8 mg Victoza but somewhat better control of her portions but on her own she is taking only 1.2 mg which does not control her appetite consistently; However she thinks that with taking it at 12 noon this  dose works okay  Hypoglycemia: only once, last night at 10:30 PM, not clear why       Meals at 1 pm and 5-6 pm A1c has been over 10% the last 2 times    She did not tolerate [place] and Invokana because of candidiasis and possible myalgia  Mealtime insulin: She usually is not eating breakfast and taking this only at lunch and dinner Currently not able to exercise much because of recent foot surgery  Glucose patterns from one touch download:  PRE-MEAL Breakfast Lunch Dinner Bedtime Overall  Glucose range: 89-200 157 270 61-425   median: 130    160   POST-MEAL PC Breakfast PC Lunch PC Dinner  Glucose range:   208-395  Mean/median:          Oral hypoglycemic drugs: None    Side effects from medications: ? myalgia from [employer].  Diarrhea from metformin   Insulin regimen: Lantus 70 units at 5. Apidra 15 units a.c. 1-2 times a day         Proper timing of medications in relation to meals: Yes, usually.         Physical activity: exercise: none           Dietician visit: Most recent: 04/2010     Weight control:  Wt Readings from Last 3 Encounters:  03/30/14 271 lb 12.8 oz (123.288 kg)  02/11/14 267 lb (121.11 kg)  12/23/13 263 lb 9.6 oz (119.568 kg)  Diabetes labs:  Fructosamine level is 311, 345 previously  Lab Results  Component Value Date   HGBA1C 10.3* 01/29/2014   HGBA1C 10.7* 07/29/2013   HGBA1C 10.9* 11/11/2011   Lab Results  Component Value Date   MICROALBUR 1.1 12/16/2013   LDLCALC 80 07/29/2013   CREATININE 1.1 03/26/2014       Medication List       This list is accurate as of: 03/30/14  3:23 PM.  Always use your most recent med list.               DULERA 100-5 MCG/ACT Aero  Generic drug:  mometasone-formoterol     EST ESTROGENS-METHYLTEST HS 0.625-1.25 MG per tablet  Generic drug:  estrogen-methylTESTOSTERone     fluconazole 150 MG tablet  Commonly known as:  DIFLUCAN  Take 1 tablet (150 mg total) by mouth once.      glucose blood test strip  Commonly known as:  ONETOUCH VERIO  Use as instructed to check blood sugar 3 times  a day dx code 250.00     ibuprofen 600 MG tablet  Commonly known as:  ADVIL,MOTRIN     LANTUS SOLOSTAR 100 UNIT/ML Solostar Pen  Generic drug:  Insulin Glargine  Inject 70 Units into the skin daily at 10 pm.     Insulin Glargine 300 UNIT/ML Sopn  Commonly known as:  TOUJEO SOLOSTAR  Inject 70 Units into the skin daily.     insulin glulisine 100 UNIT/ML injection  Commonly known as:  APIDRA  Inject 5-20 Units into the skin 2 (two) times daily. 5 units in am and 20 units with lunch     Insulin Pen Needle 31G X 5 MM Misc  Commonly known as:  B-D UF III MINI PEN NEEDLES  Use 4 per day     multivitamin with minerals Tabs tablet  Take 1 tablet by mouth daily.     pantoprazole 40 MG tablet  Commonly known as:  PROTONIX  Take 40 mg by mouth daily.     simvastatin 20 MG tablet  Commonly known as:  ZOCOR     TRIBENZOR 40-10-25 MG Tabs  Generic drug:  Olmesartan-Amlodipine-HCTZ  Take 1 tablet by mouth daily.     valACYclovir 1000 MG tablet  Commonly known as:  VALTREX     venlafaxine 75 MG tablet  Commonly known as:  EFFEXOR  Take 75 mg by mouth daily.     venlafaxine XR 150 MG 24 hr capsule  Commonly known as:  EFFEXOR-XR  Take 150 mg by mouth daily.     VICTOZA 18 MG/3ML Sopn  Generic drug:  Liraglutide  INJECT 1.2 MG UNDER THE SKIN DAILY        Allergies: No Known Allergies  Past Medical History  Diagnosis Date  . Hypertension   . Diabetes mellitus   . Anxiety   . Hypercholesteremia     Past Surgical History  Procedure Laterality Date  . Cesarean section    . Abdominal hysterectomy    . Thyroid surgery    . Pituitary surgery    . Brain meningioma excision  2005    Family History  Problem Relation Age of Onset  . Diabetes Mother   . Hyperlipidemia Mother   . Hypertension Mother     Social History:  reports that she has never smoked. She  has never used smokeless tobacco. She reports that she drinks about 1.8 oz of alcohol per week. She reports that she does not use  illicit drugs.  Review of Systems:  Hypertension:  currently is taking Benicar HCT/amlodipine combination with good control   Lipids: Taking low dose simvastatin for some time with adequate control  Lab Results  Component Value Date   CHOL 135 07/29/2013   HDL 39.60 07/29/2013   LDLCALC 80 07/29/2013   TRIG 76.0 07/29/2013   CHOLHDL 3 07/29/2013    Has not had any recent steroids, had hammertoe surgery   Examination:   BP 140/78 mmHg  Pulse 98  Temp(Src) 97.8 F (36.6 C)  Resp 14  Ht 6' (1.829 m)  Wt 271 lb 12.8 oz (123.288 kg)  BMI 36.85 kg/m2  SpO2 96%  Body mass index is 36.85 kg/(m^2).   No pedal edema  ASSESSMENT/ PLAN:    Diabetes type 2:  Blood glucose control appears to be again poor and inconsistent As discussed in history of present illness she is not following instructions for any of her insulin doses or oral medications Although her fasting readings are relatively good and occasionally low normal she is still having high readings in the evenings Explained to her in detail the nature of Toujeo insulin and that it will not cause nausea and vomiting Also discussed safety of Invokana and reassured her that it does not cause cardiovascular problems.  Discussed benefits especially with her weight gain continuing She needs to significantly improve her diet and she thinks she is doing better with taking her Victoza at noon  Recommendations today:  She will go back to taking Toujeo starting with 70 units, given instructions on how to adjust the dose based on fasting readings  She agrees to try Invokana again starting with half tablet and will also have her keep Diflucan handy if needed; may need to reduce insulin when the blood sugars started coming down, probably at least 5 units of basal insulin  Adjust mealtime dose based on  postprandial readings, needs to do this more consistently 2 hours after eating  Avoid taking Apidra for postprandial hyperglycemia  Continue Victoza 1.2 mg but consider using 1.8 mg if she is having difficulty watching her diet  Start exercise when she can  Check A1c on the next visit  Hypertension appears to be well controlled, may need to reduce her medications with Invokana if blood pressure is lower, she will check blood pressure at home  History of nocturnal muscle cramps: She is asking about magnesium level and will check this  Patient Instructions  Try Invokana 1/2 for 3 days then 1 pill daily  Toujeo 70 units at night and keep am sugars in 90-130 range by adjusting every days by 5 units  Please check blood sugars at least half the time about 2 hours after any meal and 4x per week on waking up. Please bring blood sugar monitor to each visit    Total visit time including counseling = 25 minutes  Najae Rathert 03/30/2014, 3:23 PM

## 2014-04-01 ENCOUNTER — Ambulatory Visit: Payer: Self-pay | Admitting: Endocrinology

## 2014-04-01 NOTE — Progress Notes (Signed)
Quick Note:  Please let patient know that the lab result is normal and no further action needed ______ 

## 2014-04-05 ENCOUNTER — Other Ambulatory Visit: Payer: Self-pay | Admitting: Endocrinology

## 2014-04-14 ENCOUNTER — Other Ambulatory Visit (INDEPENDENT_AMBULATORY_CARE_PROVIDER_SITE_OTHER): Payer: Managed Care, Other (non HMO)

## 2014-04-14 ENCOUNTER — Encounter: Payer: Managed Care, Other (non HMO) | Attending: Endocrinology | Admitting: Nutrition

## 2014-04-14 DIAGNOSIS — Z713 Dietary counseling and surveillance: Secondary | ICD-10-CM | POA: Diagnosis not present

## 2014-04-14 DIAGNOSIS — Z794 Long term (current) use of insulin: Secondary | ICD-10-CM | POA: Insufficient documentation

## 2014-04-14 DIAGNOSIS — E1165 Type 2 diabetes mellitus with hyperglycemia: Secondary | ICD-10-CM

## 2014-04-14 DIAGNOSIS — E119 Type 2 diabetes mellitus without complications: Secondary | ICD-10-CM | POA: Insufficient documentation

## 2014-04-14 DIAGNOSIS — IMO0002 Reserved for concepts with insufficient information to code with codable children: Secondary | ICD-10-CM

## 2014-04-14 LAB — HEMOGLOBIN A1C: HEMOGLOBIN A1C: 10.2 % — AB (ref 4.6–6.5)

## 2014-04-14 LAB — GLUCOSE, RANDOM: Glucose, Bld: 191 mg/dL — ABNORMAL HIGH (ref 70–99)

## 2014-04-14 NOTE — Progress Notes (Signed)
Pt. Did not bring her meter.  Is testing sometimes in the AM.  Tested today and FBS was "around 220".  She took 20u of Apidra per sliding scale order.  Says blood sugars have been up for the last 3 weeks.  Had foot surgery 3 weeks again.  Is still having pain from this.  New Verio meter given with extra test stips, so that she can test BID  Typical day: 6:30  Bfast of coffee and 1 package of 6 nabs.   12:30 lunch of Lean Quizine and water 6PM supper of meat (3-5 ounces), and 2 non starchy veg.  No bread and regular gingerale--8 ounces 8-9PM snacking on 60-90 grams of carbs 9:30PM Toujeo 70u  sleep She is not testing blood sugars but once a day--if that.  Plan: 1.  Take 15u of Apidra ac snacking   2. Test blood sugars acB and acS and call readings to me next week       3.  Stop drinking regular gingerale--switch to diet

## 2014-04-14 NOTE — Patient Instructions (Addendum)
Test blood sugars before breakfast and supper.   Take 15u before snacking after supper Stop the regular soda and switch to diet gingerale Call blood sugars to me next week.  [phone] [id number]

## 2014-04-16 ENCOUNTER — Other Ambulatory Visit: Payer: Self-pay | Admitting: Endocrinology

## 2014-04-18 ENCOUNTER — Other Ambulatory Visit: Payer: Self-pay | Admitting: Endocrinology

## 2014-04-22 ENCOUNTER — Encounter: Payer: Self-pay | Admitting: Endocrinology

## 2014-04-22 ENCOUNTER — Ambulatory Visit (INDEPENDENT_AMBULATORY_CARE_PROVIDER_SITE_OTHER): Payer: Managed Care, Other (non HMO) | Admitting: Endocrinology

## 2014-04-22 VITALS — BP 131/82 | HR 103 | Temp 97.7°F | Resp 14 | Ht 72.0 in | Wt 269.4 lb

## 2014-04-22 DIAGNOSIS — E1165 Type 2 diabetes mellitus with hyperglycemia: Secondary | ICD-10-CM

## 2014-04-22 DIAGNOSIS — IMO0002 Reserved for concepts with insufficient information to code with codable children: Secondary | ICD-10-CM

## 2014-04-22 DIAGNOSIS — I10 Essential (primary) hypertension: Secondary | ICD-10-CM

## 2014-04-22 NOTE — Patient Instructions (Addendum)
Please check blood sugars at least half the time about 2 hours after any meal and times per week on waking up. Please bring blood sugar monitor to each visit  Toujeo 100 units at bedtime  Apidra 30 for meals and 10-15 for snacks  Metformin at dinner

## 2014-04-22 NOTE — Progress Notes (Signed)
Patient ID: Destiny Beasley, female   DOB: 04/27/1955, 58 y.o.   MRN: [id number]   Reason for Appointment: Type II Diabetes follow-up   History of Present Illness   Date of diagnosis: 10/2008  Previous history: She had markedly increased blood sugars at diagnosis and was tried on oral hypoglycemic drugs and Victoza for about 9 months before starting an insulin.  Since 2011 she had been on basal bolus insulin regimen Previously had had difficulty controlling her diabetes mostly because of difficulty with compliance with various aspects of self-care.  Did not consistently take her Apidra at mealtimes  Recent history:  Insulin regimen: Toujeo 80 units at bedtime. Apidra 15 units a.c. twice a day          Her blood sugars are inadequately controlled again and A1c is over 10% She does not follow consistently instructions for her medications and insulin Current blood sugar patterns and problems:  She had been told to try Invokana half a tablet daily but she thinks she gets a yeast infection with this and did not take it  Also even though she was told to continue her metformin she has stopped taking this.  She can usually tolerate one tablet today only  Did not bring her monitor for download today   She thinks her fasting blood sugars are around 200 and not clear why they are higher.  She claims it is because of her foot surgery.  Currently not checking blood sugars after her evening meal and not sure if she is getting enough coverage for her evening meal.    Currently not exercising because of foot surgery       She was switched from Lantus to [facility] and she has increase the dose as instructed to 80 units but her fasting readings are still high  She was seen by nurse educator and was apparently getting as much as 90 g of carbohydrates late in the evening with snacks even after supper  She did not tolerate [place] and Invokana because of candidiasis and possible myalgia  Mealtime insulin:  She usually is not eating breakfast and taking this only at lunch and dinner, usually one p.m. and 5 PM Currently not able to exercise much because of recent foot surgery  Glucose readings from recall  PRE-MEAL Breakfast Lunch Dinner Bedtime Overall  Glucose range: 200  270    Mean/median:          Oral hypoglycemic drugs: None    Side effects from medications: ? myalgia from [employer].  Diarrhea from more than 500 mg metformin       Physical activity: exercise: none           Dietician visit: Most recent: 04/2010     Weight control:  Wt Readings from Last 3 Encounters:  04/22/14 269 lb 6.4 oz (122.199 kg)  03/30/14 271 lb 12.8 oz (123.288 kg)  02/11/14 267 lb (121.11 kg)         Diabetes labs:  Lab Results  Component Value Date   HGBA1C 10.2* 04/14/2014   HGBA1C 10.3* 01/29/2014   HGBA1C 10.7* 07/29/2013   Lab Results  Component Value Date   MICROALBUR 1.1 12/16/2013   LDLCALC 80 07/29/2013   CREATININE 1.1 03/26/2014   No visits with results within 1 Day(s) from this visit. Latest known visit with results is:  Lab on 04/14/2014  Component Date Value Ref Range Status  . Hgb A1c MFr Bld 04/14/2014 10.2* 4.6 - 6.5 %  Final   Glycemic Control Guidelines for People with Diabetes:Non Diabetic:  <6%Goal of Therapy: <7%Additional Action Suggested:  >8%   . Glucose, Bld 04/14/2014 191* 70 - 99 mg/dL Final       Medication List       This list is accurate as of: 04/22/14 11:59 PM.  Always use your most recent med list.               DULERA 100-5 MCG/ACT Aero  Generic drug:  mometasone-formoterol     EST ESTROGENS-METHYLTEST HS 0.625-1.25 MG per tablet  Generic drug:  estrogen-methylTESTOSTERone     fluconazole 150 MG tablet  Commonly known as:  DIFLUCAN  TAKE 1 TABLET BY MOUTH AS ONE DOSE     glucose blood test strip  Commonly known as:  ONETOUCH VERIO  Use as instructed to check blood sugar 3 times  a day dx code 250.00     ibuprofen 600 MG  tablet  Commonly known as:  ADVIL,MOTRIN     Insulin Glargine 300 UNIT/ML Sopn  Commonly known as:  TOUJEO SOLOSTAR  Inject 70 Units into the skin daily.     insulin glulisine 100 UNIT/ML injection  Commonly known as:  APIDRA  Inject 5-20 Units into the skin 2 (two) times daily. 5 units in am and 20 units with lunch     Insulin Pen Needle 31G X 5 MM Misc  Commonly known as:  B-D UF III MINI PEN NEEDLES  Use 4 per day     metFORMIN 500 MG tablet  Commonly known as:  GLUCOPHAGE  TAKE 1 TABLET BY MOUTH TWICE DAILY WITH A MEAL     multivitamin with minerals Tabs tablet  Take 1 tablet by mouth daily.     pantoprazole 40 MG tablet  Commonly known as:  PROTONIX  Take 40 mg by mouth daily.     simvastatin 20 MG tablet  Commonly known as:  ZOCOR     TRIBENZOR 40-10-25 MG Tabs  Generic drug:  Olmesartan-Amlodipine-HCTZ  Take 1 tablet by mouth daily.     valACYclovir 1000 MG tablet  Commonly known as:  VALTREX     venlafaxine 75 MG tablet  Commonly known as:  EFFEXOR  Take 75 mg by mouth daily.     venlafaxine XR 150 MG 24 hr capsule  Commonly known as:  EFFEXOR-XR  Take 150 mg by mouth daily.     VICTOZA 18 MG/3ML Sopn  Generic drug:  Liraglutide  INJECT 1.2 MG UNDER THE SKIN DAILY        Allergies: No Known Allergies  Past Medical History  Diagnosis Date  . Hypertension   . Diabetes mellitus   . Anxiety   . Hypercholesteremia     Past Surgical History  Procedure Laterality Date  . Cesarean section    . Abdominal hysterectomy    . Thyroid surgery    . Pituitary surgery    . Brain meningioma excision  2005    Family History  Problem Relation Age of Onset  . Diabetes Mother   . Hyperlipidemia Mother   . Hypertension Mother     Social History:  reports that she has never smoked. She has never used smokeless tobacco. She reports that she drinks about 1.8 oz of alcohol per week. She reports that she does not use illicit drugs.  Review of  Systems:  Hypertension:  currently is taking Benicar HCT/amlodipine combination with good control  Renal function has been normal  Lipids:  Taking low dose simvastatin for some time with adequate control  Lab Results  Component Value Date   CHOL 135 07/29/2013   HDL 39.60 07/29/2013   LDLCALC 80 07/29/2013   TRIG 76.0 07/29/2013   CHOLHDL 3 07/29/2013    She had hammertoe surgery and is wearing a special shoe on both sides Diabetic shoes have been ordered   Examination:   BP 131/82 mmHg  Pulse 103  Temp(Src) 97.7 F (36.5 C)  Resp 14  Ht 6' (1.829 m)  Wt 269 lb 6.4 oz (122.199 kg)  BMI 36.53 kg/m2  SpO2 96%  Body mass index is 36.53 kg/(m^2).   No pedal edema  ASSESSMENT/ PLAN:    Diabetes type 2:  Blood glucose control appears to be again poor and inconsistent As discussed in history of present illness she is not responding to relatively large doses of insulin along with Victoza She claims to be intolerant to Invokana and has stopped her metformin by mistake Not clear why her fasting readings are higher, possibly from not taking metformin Also recently has been inconsistent with diet and exercise  Recommendations today:  She will increase her mealtime coverage at supper  She will need to cover her large evening carbohydrate snacks with Apidra    She may try at least 500 mg of metformin, consider 1000 mg  May need to increase Toujeo if fasting blood sugars continue to be high  Continue Victoza 1.2 mg but consider using 1.8 mg if she is having difficulty watching her diet  Start exercise when she can  Reduce excessive snacking especially after supper   Hypertension appears to be well controlled    Patient Instructions  Please check blood sugars at least half the time about 2 hours after any meal and times per week on waking up. Please bring blood sugar monitor to each visit  Toujeo 100 units at bedtime  Apidra 30 for meals and 10-15 for  snacks  Metformin at dinner     Counseling time over 50% of today's 25 minute visit  Sally Reimers 04/23/2014, 11:47 AM

## 2014-04-24 HISTORY — PX: FOOT SURGERY: SHX648

## 2014-04-25 ENCOUNTER — Other Ambulatory Visit: Payer: Self-pay | Admitting: Endocrinology

## 2014-05-05 ENCOUNTER — Other Ambulatory Visit: Payer: Self-pay | Admitting: Endocrinology

## 2014-05-09 ENCOUNTER — Other Ambulatory Visit: Payer: Self-pay | Admitting: Endocrinology

## 2014-05-20 ENCOUNTER — Ambulatory Visit: Payer: Self-pay | Admitting: Endocrinology

## 2014-05-20 ENCOUNTER — Encounter: Payer: Self-pay | Admitting: *Deleted

## 2014-05-20 ENCOUNTER — Telehealth: Payer: Self-pay | Admitting: Endocrinology

## 2014-05-20 NOTE — Telephone Encounter (Signed)
Letter Mailed

## 2014-05-20 NOTE — Telephone Encounter (Signed)
Patient no showed today's appt. Please advise on how to follow up. °A. No follow up necessary. °B. Follow up urgent. Contact patient immediately. °C. Follow up necessary. Contact patient and schedule visit in ___ days. °D. Follow up advised. Contact patient and schedule visit in ____weeks. ° °

## 2014-06-25 ENCOUNTER — Other Ambulatory Visit (INDEPENDENT_AMBULATORY_CARE_PROVIDER_SITE_OTHER): Payer: Managed Care, Other (non HMO)

## 2014-06-25 DIAGNOSIS — E785 Hyperlipidemia, unspecified: Secondary | ICD-10-CM

## 2014-06-25 DIAGNOSIS — E1165 Type 2 diabetes mellitus with hyperglycemia: Secondary | ICD-10-CM

## 2014-06-25 DIAGNOSIS — IMO0002 Reserved for concepts with insufficient information to code with codable children: Secondary | ICD-10-CM

## 2014-06-25 LAB — BASIC METABOLIC PANEL
BUN: 17 mg/dL (ref 6–23)
CALCIUM: 9.6 mg/dL (ref 8.4–10.5)
CO2: 32 mEq/L (ref 19–32)
Chloride: 101 mEq/L (ref 96–112)
Creatinine, Ser: 1 mg/dL (ref 0.40–1.20)
GFR: 73.65 mL/min (ref >60.00)
GLUCOSE: 126 mg/dL — AB (ref 70–99)
Potassium: 3.8 mEq/L (ref 3.5–5.1)
SODIUM: 136 meq/L (ref 135–145)

## 2014-06-25 LAB — LIPID PANEL
CHOL/HDL RATIO: 4
Cholesterol: 150 mg/dL (ref 0–200)
HDL: 41.2 mg/dL (ref >39.00)
LDL Cholesterol: 94 mg/dL (ref 0–99)
NonHDL: 108.8
TRIGLYCERIDES: 74 mg/dL (ref 0.0–149.0)
VLDL: 14.8 mg/dL (ref 0.0–40.0)

## 2014-06-25 LAB — HEMOGLOBIN A1C: Hgb A1c MFr Bld: 8.8 % — ABNORMAL HIGH (ref 4.6–6.5)

## 2014-06-25 NOTE — Addendum Note (Signed)
Addended by: Loney Loh on: 06/25/2014 08:47 AM   Modules accepted: Orders

## 2014-06-29 ENCOUNTER — Encounter: Payer: Self-pay | Admitting: Endocrinology

## 2014-06-29 ENCOUNTER — Other Ambulatory Visit: Payer: Self-pay | Admitting: *Deleted

## 2014-06-29 ENCOUNTER — Ambulatory Visit (INDEPENDENT_AMBULATORY_CARE_PROVIDER_SITE_OTHER): Payer: Managed Care, Other (non HMO) | Admitting: Endocrinology

## 2014-06-29 VITALS — BP 139/84 | HR 88 | Temp 98.5°F | Resp 16 | Ht 72.0 in | Wt 269.2 lb

## 2014-06-29 DIAGNOSIS — E1165 Type 2 diabetes mellitus with hyperglycemia: Secondary | ICD-10-CM

## 2014-06-29 DIAGNOSIS — IMO0002 Reserved for concepts with insufficient information to code with codable children: Secondary | ICD-10-CM

## 2014-06-29 DIAGNOSIS — I10 Essential (primary) hypertension: Secondary | ICD-10-CM

## 2014-06-29 MED ORDER — LIRAGLUTIDE 18 MG/3ML ~~LOC~~ SOPN: PEN_INJECTOR | SUBCUTANEOUS | Status: DC

## 2014-06-29 NOTE — Patient Instructions (Signed)
Apidra 10-15 at supper an keep after meal sugar in range   Please check blood sugars at least half the time about 2 hours after any meal and 3 times per week on waking up. Please bring blood sugar monitor to each visit. Recommended blood sugar levels about 2 hours after meal is 130-160 and on waking up 90-130

## 2014-06-29 NOTE — Progress Notes (Signed)
Patient ID: DAVA RENSCH, female   DOB: 08-26-1955, 59 y.o.   MRN: [id number]   Reason for Appointment: Type II Diabetes follow-up   History of Present Illness   Date of diagnosis: 10/2008  Previous history: She had markedly increased blood sugars at diagnosis and was tried on oral hypoglycemic drugs and Victoza for about 9 months before starting an insulin.  Since 2011 she had been on basal bolus insulin regimen Previously had had difficulty controlling her diabetes mostly because of difficulty with compliance with various aspects of self-care.  Did not consistently take her Apidra at mealtimes  Recent history:  Insulin regimen: Toujeo 80 units at bedtime. Apidra 5 units a.m. and 20 acs twice a day    She was seen about 2 months ago with persistently poor control of her diabetes Since her last visit she has been taking 1.8 mg Victoza as suggested and she thinks this is helping her portion control Also she appears to be more motivated to watch her diet and is trying to reduce her carbohydrates significantly  Although she was told to try metformin she has GI side effects and has not taken any        Her blood sugars are overall still inadequately controlled again and A1c is 8.8% She has continued her mealtime insulin and Toujeo Has not taken Invokana because of recurrent candidiasis She did finally bring her monitor for download for review  Current blood sugar patterns and problems:  She is checking blood sugars primarily in the mornings despite reminders to do readings in the 2 hour postprandial times  Fasting blood sugars are quite variable including some hypoglycemia about 2-3 weeks ago but recently fasting readings are mildly increased; however overall blood sugars are much better in the morning when she was reportedly having readings around 200  She has only a couple of readings in the evenings, none in the last 10 days and these seem to be low normal   had been told by nurse  educator to reduce her snacking at night and she is doing this better which I be helping her overnight hypoglycemia  She still has difficulty losing weight even though she has been exercising for the last month.  However this may be helping her control  She did not tolerate [place] and Invokana because of candidiasis and possible myalgia  Mealtime insulin: She usually is not eating breakfast and taking Apidra only at lunch and dinner, usually 1 p.m. and 5 PM  Glucose readings from download  PRE-MEAL Breakfast Lunch Dinner Bedtime Overall  Glucose range:  51-190    66-95    Mean/median:      107     Oral hypoglycemic drugs: None    Side effects from medications: ? myalgia from [employer].  Candidiasis from [place].  Diarrhea from more than 500 mg metformin       Physical activity: exercise: at gym for 1 month; treadmill, bike           Dietician visit: Most recent: 04/2010     Weight control:  Wt Readings from Last 3 Encounters:  06/29/14 269 lb 3.2 oz (122.108 kg)  04/22/14 269 lb 6.4 oz (122.199 kg)  03/30/14 271 lb 12.8 oz (123.288 kg)         Diabetes labs:  Lab Results  Component Value Date   HGBA1C 8.8* 06/25/2014   HGBA1C 10.2* 04/14/2014   HGBA1C 10.3* 01/29/2014   Lab Results  Component Value Date  MICROALBUR 1.1 12/16/2013   LDLCALC 94 06/25/2014   CREATININE 1.00 06/25/2014   No visits with results within 1 Day(s) from this visit. Latest known visit with results is:  Lab on 06/25/2014  Component Date Value Ref Range Status  . Hgb A1c MFr Bld 06/25/2014 8.8* 4.6 - 6.5 % Final   Glycemic Control Guidelines for People with Diabetes:Non Diabetic:  <6%Goal of Therapy: <7%Additional Action Suggested:  >8%   . Sodium 06/25/2014 136  135 - 145 mEq/L Final  . Potassium 06/25/2014 3.8  3.5 - 5.1 mEq/L Final  . Chloride 06/25/2014 101  96 - 112 mEq/L Final  . CO2 06/25/2014 32  19 - 32 mEq/L Final  . Glucose, Bld 06/25/2014 126* 70 - 99 mg/dL Final  . BUN  06/25/2014 17  6 - 23 mg/dL Final  . Creatinine, Ser 06/25/2014 1.00  0.40 - 1.20 mg/dL Final  . Calcium 06/25/2014 9.6  8.4 - 10.5 mg/dL Final  . GFR 06/25/2014 73.65  >60.00 mL/min Final  . Cholesterol 06/25/2014 150  0 - 200 mg/dL Final   ATP III Classification       Desirable:  < 200 mg/dL               Borderline High:  200 - 239 mg/dL          High:  > = 240 mg/dL  . Triglycerides 06/25/2014 74.0  0.0 - 149.0 mg/dL Final   Normal:  <150 mg/dLBorderline High:  150 - 199 mg/dL  . HDL 06/25/2014 41.20  >39.00 mg/dL Final  . VLDL 06/25/2014 14.8  0.0 - 40.0 mg/dL Final  . LDL Cholesterol 06/25/2014 94  0 - 99 mg/dL Final  . Total CHOL/HDL Ratio 06/25/2014 4   Final                  Men          Women1/2 Average Risk     3.4          3.3Average Risk          5.0          4.42X Average Risk          9.6          7.13X Average Risk          15.0          11.0                      . NonHDL 06/25/2014 108.80   Final   NOTE:  Non-HDL goal should be 30 mg/dL higher than patient's LDL goal (i.e. LDL goal of < 70 mg/dL, would have non-HDL goal of < 100 mg/dL)       Medication List       This list is accurate as of: 06/29/14 11:59 PM.  Always use your most recent med list.               DULERA 100-5 MCG/ACT Aero  Generic drug:  mometasone-formoterol     EST ESTROGENS-METHYLTEST HS 0.625-1.25 MG per tablet  Generic drug:  estrogen-methylTESTOSTERone     fluconazole 150 MG tablet  Commonly known as:  DIFLUCAN  TAKE 1 TABLET BY MOUTH AS ONE DOSE     glucose blood test strip  Commonly known as:  ONETOUCH VERIO  Use as instructed to check blood sugar 3 times  a day dx code 250.00     ibuprofen 600  MG tablet  Commonly known as:  ADVIL,MOTRIN     insulin glulisine 100 UNIT/ML injection  Commonly known as:  APIDRA  Inject 5-20 Units into the skin 2 (two) times daily. 5 units in am and 20 units with lunch     APIDRA SOLOSTAR 100 UNIT/ML Solostar Pen  Generic drug:  Insulin Glulisine      Insulin Pen Needle 31G X 5 MM Misc  Commonly known as:  B-D UF III MINI PEN NEEDLES  Use 4 per day     INVOKANA 100 MG Tabs tablet  Generic drug:  canagliflozin  TAKE 1 TABLET BY MOUTH DAILY     Liraglutide 18 MG/3ML Sopn  Commonly known as:  VICTOZA  INJECT 1.8 MG UNDER THE SKIN DAILY     metFORMIN 500 MG tablet  Commonly known as:  GLUCOPHAGE  TAKE 1 TABLET BY MOUTH TWICE DAILY WITH A MEAL     multivitamin with minerals Tabs tablet  Take 1 tablet by mouth daily.     pantoprazole 40 MG tablet  Commonly known as:  PROTONIX  Take 40 mg by mouth daily.     simvastatin 20 MG tablet  Commonly known as:  ZOCOR     TOUJEO SOLOSTAR 300 UNIT/ML Sopn  Generic drug:  Insulin Glargine  INJECT 70 UNITS INTO THE SKIN DAILY     TRIBENZOR 40-10-25 MG Tabs  Generic drug:  Olmesartan-Amlodipine-HCTZ  Take 1 tablet by mouth daily.     valACYclovir 1000 MG tablet  Commonly known as:  VALTREX     venlafaxine 75 MG tablet  Commonly known as:  EFFEXOR  Take 75 mg by mouth daily.     venlafaxine XR 150 MG 24 hr capsule  Commonly known as:  EFFEXOR-XR  Take 150 mg by mouth daily.     Vitamin B-12 2500 MCG Subl  Place under the tongue.        Allergies: No Known Allergies  Past Medical History  Diagnosis Date  . Hypertension   . Diabetes mellitus   . Anxiety   . Hypercholesteremia     Past Surgical History  Procedure Laterality Date  . Cesarean section    . Abdominal hysterectomy    . Thyroid surgery    . Pituitary surgery    . Brain meningioma excision  2005    Family History  Problem Relation Age of Onset  . Diabetes Mother   . Hyperlipidemia Mother   . Hypertension Mother     Social History:  reports that she has never smoked. She has never used smokeless tobacco. She reports that she drinks about 1.8 oz of alcohol per week. She reports that she does not use illicit drugs.  Review of Systems:  Hypertension:  currently is taking Benicar HCT/amlodipine  combination with usually good control  Renal function has been normal  Lipids: Taking low dose simvastatin for some time with adequate control  Lab Results  Component Value Date   CHOL 150 06/25/2014   HDL 41.20 06/25/2014   LDLCALC 94 06/25/2014   TRIG 74.0 06/25/2014   CHOLHDL 4 06/25/2014    She had hammertoe surgery and is wearing a special shoe on both sides Diabetic shoe prescription has been given previously   Examination:   BP 139/84 mmHg  Pulse 88  Temp(Src) 98.5 F (36.9 C)  Resp 16  Ht 6' (1.829 m)  Wt 269 lb 3.2 oz (122.108 kg)  BMI 36.50 kg/m2  SpO2 97%  Body mass index is  36.5 kg/(m^2).   No pedal edema  ASSESSMENT/ PLAN:    Diabetes type 2:  Blood glucose control appears to be overall much better compared to her previous visits Although she is checking blood sugars mostly in the mornings which are averaging about 110 her A1c is still  Higher at 8.8 Most likely she has had better control on in the last month or so with her exercise regimen, much better compliance with diet and increasing her Victoza to 1.8 As discussed in history of present illness she does need to somewhat better management of her glucose monitoring, consistent balanced meals and adjustment of her mealtime insulin based on her meal size and carbohydrate intake to avoid hypoglycemia  Recommendations today:  She will adjust her mealtime insulin based on meal size, carbohydrate content and blood sugar patterns after eating  If blood sugars start getting low in the morning she may be able to reduce her Toujeo, discussed fasting targets  No change in Victoza    Patient Instructions  Apidra 10-15 at supper an keep after meal sugar in range   Please check blood sugars at least half the time about 2 hours after any meal and 3 times per week on waking up. Please bring blood sugar monitor to each visit. Recommended blood sugar levels about 2 hours after meal is 130-160 and on waking up  90-130    Emma Birchler 06/30/2014, 5:27 PM

## 2014-08-28 ENCOUNTER — Other Ambulatory Visit: Payer: Self-pay | Admitting: Endocrinology

## 2014-09-24 ENCOUNTER — Other Ambulatory Visit: Payer: Self-pay

## 2014-09-29 ENCOUNTER — Ambulatory Visit: Payer: Self-pay | Admitting: Endocrinology

## 2014-10-20 ENCOUNTER — Other Ambulatory Visit: Payer: Self-pay | Admitting: *Deleted

## 2014-10-20 MED ORDER — INSULIN GLARGINE 300 UNIT/ML ~~LOC~~ SOPN: 70.0000 [IU] | PEN_INJECTOR | Freq: Every day | SUBCUTANEOUS | Status: DC

## 2014-10-23 ENCOUNTER — Other Ambulatory Visit: Payer: Self-pay | Admitting: Endocrinology

## 2014-11-06 ENCOUNTER — Other Ambulatory Visit: Payer: Self-pay | Admitting: Endocrinology

## 2014-11-30 ENCOUNTER — Other Ambulatory Visit: Payer: Self-pay | Admitting: *Deleted

## 2014-11-30 ENCOUNTER — Telehealth: Payer: Self-pay | Admitting: Endocrinology

## 2014-11-30 MED ORDER — INSULIN GLARGINE 300 UNIT/ML ~~LOC~~ SOPN: 70.0000 [IU] | PEN_INJECTOR | Freq: Every day | SUBCUTANEOUS | Status: DC

## 2014-11-30 NOTE — Telephone Encounter (Signed)
P.A for toujeo approved, faxed to pharmacy for a one month refill, patient needs appointment

## 2014-11-30 NOTE — Telephone Encounter (Signed)
Patient called stating that her insurance will be sending over a authorization for her medication  Destiny Beasley has been out of this medication for one week   Rx: Toujeo   Thank you

## 2014-12-08 ENCOUNTER — Other Ambulatory Visit: Payer: Self-pay | Admitting: Endocrinology

## 2015-01-20 ENCOUNTER — Encounter: Payer: Self-pay | Admitting: Endocrinology

## 2015-02-08 ENCOUNTER — Other Ambulatory Visit (INDEPENDENT_AMBULATORY_CARE_PROVIDER_SITE_OTHER): Payer: Managed Care, Other (non HMO)

## 2015-02-08 DIAGNOSIS — IMO0002 Reserved for concepts with insufficient information to code with codable children: Secondary | ICD-10-CM

## 2015-02-08 DIAGNOSIS — E1165 Type 2 diabetes mellitus with hyperglycemia: Secondary | ICD-10-CM

## 2015-02-08 LAB — COMPREHENSIVE METABOLIC PANEL
ALBUMIN: 3.9 g/dL (ref 3.5–5.2)
ALT: 34 U/L (ref 0–35)
AST: 24 U/L (ref 0–37)
Alkaline Phosphatase: 80 U/L (ref 39–117)
BUN: 14 mg/dL (ref 6–23)
CALCIUM: 9.7 mg/dL (ref 8.4–10.5)
CO2: 34 mEq/L — ABNORMAL HIGH (ref 19–32)
Chloride: 102 mEq/L (ref 96–112)
Creatinine, Ser: 0.93 mg/dL (ref 0.40–1.20)
GFR: 79.91 mL/min (ref >60.00)
Glucose, Bld: 156 mg/dL — ABNORMAL HIGH (ref 70–99)
POTASSIUM: 3.9 meq/L (ref 3.5–5.1)
Sodium: 142 mEq/L (ref 135–145)
TOTAL PROTEIN: 7.5 g/dL (ref 6.0–8.3)
Total Bilirubin: 0.2 mg/dL (ref 0.2–1.2)

## 2015-02-08 LAB — HEMOGLOBIN A1C: HEMOGLOBIN A1C: 9.3 % — AB (ref 4.6–6.5)

## 2015-02-11 ENCOUNTER — Other Ambulatory Visit: Payer: Self-pay | Admitting: *Deleted

## 2015-02-11 ENCOUNTER — Ambulatory Visit (INDEPENDENT_AMBULATORY_CARE_PROVIDER_SITE_OTHER): Payer: Managed Care, Other (non HMO) | Admitting: Endocrinology

## 2015-02-11 ENCOUNTER — Encounter: Payer: Self-pay | Admitting: Endocrinology

## 2015-02-11 VITALS — BP 138/80 | HR 91 | Temp 98.2°F | Resp 14 | Ht 72.0 in | Wt 273.8 lb

## 2015-02-11 DIAGNOSIS — I10 Essential (primary) hypertension: Secondary | ICD-10-CM

## 2015-02-11 DIAGNOSIS — Z794 Long term (current) use of insulin: Secondary | ICD-10-CM

## 2015-02-11 DIAGNOSIS — E785 Hyperlipidemia, unspecified: Secondary | ICD-10-CM | POA: Diagnosis not present

## 2015-02-11 DIAGNOSIS — E1165 Type 2 diabetes mellitus with hyperglycemia: Secondary | ICD-10-CM

## 2015-02-11 MED ORDER — GLUCOSE BLOOD VI STRP: ORAL_STRIP | Status: DC

## 2015-02-11 MED ORDER — LIRAGLUTIDE 18 MG/3ML ~~LOC~~ SOPN: PEN_INJECTOR | SUBCUTANEOUS | Status: DC

## 2015-02-11 NOTE — Patient Instructions (Signed)
Check blood sugars on waking up  3 times a week Also check blood sugars about 2 hours after a meal and do this after different meals by rotation  Recommended blood sugar levels on waking up is 90-130 and about 2 hours after meal is 130-160  Please bring your blood sugar monitor to each visit, thank you  Victoza 0.6, then 1.2 then 1.8 in 3 day intervals  When sugars in am < 90 reduce Toujeo by 5  Apidra 20-25 at supper, 10 units with oatnmeal  Walk 30 min briskly daily

## 2015-02-11 NOTE — Progress Notes (Signed)
Patient ID: Destiny Beasley, female   DOB: [birth year], 57 y.o.   MRN: [id number]   Reason for Appointment: Type II Diabetes follow-up   History of Present Illness   Date of diagnosis: 10/2008  Previous history: She had markedly increased blood sugars at diagnosis and was tried on oral hypoglycemic drugs and Victoza for about 9 months before starting an insulin.  Since 2011 she had been on basal bolus insulin regimen Previously had had difficulty controlling her diabetes mostly because of difficulty with compliance with various aspects of self-care.  Did not consistently take her Apidra at mealtimes  Recent history:   Insulin regimen: Toujeo 80 units at bedtime. Apidra 5 units a.m. and 20 acs twice a day    She has not been seen in follow-up since 3/16 She generally tends to have poorly controlled diabetes despite taking basal bolus insulin, Victoza  On her last visit she appeared to have much better blood sugars with taking 1.8 mg Victoza and also trying to watch her diet and reduce carbohydrate significantly A1c had started improving down to 8.8% However A1c is now back up at 9.3 She nausea that she left off her Victoza a few weeks ago and is trying metformin instead Also she appears to be less motivated to watch her diet and is gaining back weight Although she previously had GI side effects with metformin she thinks she is tolerating it for the last 3 weeks         Current blood sugar patterns and problems:  She is checking blood sugars mostly overnight and in the mornings and some late in the evenings  For some reason now she is not taking any insulin at suppertime for her main meal and is taking Apidra only at lunch  Her blood sugars are variable in the morning and not clear why some of: She may be eating late at night  She has markedly increased blood sugars after her evening meal usually  She also has a couple of high readings mid-day and afternoon but only 1  reading of 154 that is near normal in the evening  Mealtime insulin: She usually is not eating breakfast and taking Apidra only at lunch, usually 1 p.m.  Oral hypoglycemic drugs: Metformin 2000 mg a day     Glucose readings from download  Mean values apply above for all meters except median for [employer]  PRE-MEAL Fasting Lunch Dinner  PCS  Overall  Glucose range:  118-349   222    154-388    Mean/median:      222    Overnight blood sugar range is 207-250  Side effects from medications: ? myalgia from [facility].  Candidiasis from [place].        Physical activity: exercise: Some walking            Dietician visit: Most recent: 04/2010  Oatmeal in am   Weight control:  Wt Readings from Last 3 Encounters:  02/11/15 273 lb 12.8 oz (124.195 kg)  06/29/14 269 lb 3.2 oz (122.108 kg)  04/22/14 269 lb 6.4 oz (122.199 kg)         Diabetes labs:  Lab Results  Component Value Date   HGBA1C 9.3* 02/08/2015   HGBA1C 8.8* 06/25/2014   HGBA1C 10.2* 04/14/2014   Lab Results  Component Value Date   MICROALBUR 1.1 12/16/2013   LDLCALC 94 06/25/2014   CREATININE 0.93 02/08/2015   No visits with results within 1 Day(s)  from this visit. Latest known visit with results is:  Appointment on 02/08/2015  Component Date Value Ref Range Status  . Hgb A1c MFr Bld 02/08/2015 9.3* 4.6 - 6.5 % Final   Glycemic Control Guidelines for People with Diabetes:Non Diabetic:  <6%Goal of Therapy: <7%Additional Action Suggested:  >8%   . Sodium 02/08/2015 142  135 - 145 mEq/L Final  . Potassium 02/08/2015 3.9  3.5 - 5.1 mEq/L Final  . Chloride 02/08/2015 102  96 - 112 mEq/L Final  . CO2 02/08/2015 34* 19 - 32 mEq/L Final  . Glucose, Bld 02/08/2015 156* 70 - 99 mg/dL Final  . BUN 02/08/2015 14  6 - 23 mg/dL Final  . Creatinine, Ser 02/08/2015 0.93  0.40 - 1.20 mg/dL Final  . Total Bilirubin 02/08/2015 0.2  0.2 - 1.2 mg/dL Final  . Alkaline Phosphatase 02/08/2015 80  39 - 117 U/L Final  . AST  02/08/2015 24  0 - 37 U/L Final  . ALT 02/08/2015 34  0 - 35 U/L Final  . Total Protein 02/08/2015 7.5  6.0 - 8.3 g/dL Final  . Albumin 02/08/2015 3.9  3.5 - 5.2 g/dL Final  . Calcium 02/08/2015 9.7  8.4 - 10.5 mg/dL Final  . GFR 02/08/2015 79.91  >60.00 mL/min Final       Medication List       This list is accurate as of: 02/11/15  4:25 PM.  Always use your most recent med list.               DULERA 100-5 MCG/ACT Aero  Generic drug:  mometasone-formoterol     fluconazole 150 MG tablet  Commonly known as:  DIFLUCAN  TAKE 1 TABLET BY MOUTH AS ONE DOSE     glucose blood test strip  Commonly known as:  ONETOUCH VERIO  USE AS DIRECTED TO CHECK BLOOD SUGARS THREE TIMES DAILY Dx code E11.65     ibuprofen 600 MG tablet  Commonly known as:  ADVIL,MOTRIN     Insulin Glargine 300 UNIT/ML Sopn  Commonly known as:  TOUJEO SOLOSTAR  Inject 70 Units into the skin daily.     insulin glulisine 100 UNIT/ML injection  Commonly known as:  APIDRA  Inject 5-20 Units into the skin 2 (two) times daily. 5 units in am and 20 units with lunch     Insulin Pen Needle 31G X 5 MM Misc  Commonly known as:  B-D UF III MINI PEN NEEDLES  Use 4 per day     Liraglutide 18 MG/3ML Sopn  Commonly known as:  VICTOZA  INJECT 1.8 MG UNDER THE SKIN DAILY     metFORMIN 500 MG tablet  Commonly known as:  GLUCOPHAGE  TAKE 1 TABLET BY MOUTH TWICE DAILY WITH A MEAL     multivitamin with minerals Tabs tablet  Take 1 tablet by mouth daily.     pantoprazole 40 MG tablet  Commonly known as:  PROTONIX  Take 40 mg by mouth daily.     simvastatin 20 MG tablet  Commonly known as:  ZOCOR     TRIBENZOR 40-10-25 MG Tabs  Generic drug:  Olmesartan-Amlodipine-HCTZ  Take 1 tablet by mouth daily.     valACYclovir 1000 MG tablet  Commonly known as:  VALTREX     venlafaxine 75 MG tablet  Commonly known as:  EFFEXOR  Take 75 mg by mouth daily.     venlafaxine XR 150 MG 24 hr capsule  Commonly known as:   EFFEXOR-XR  Take 150 mg by mouth daily.     Vitamin B-12 2500 MCG Subl  Place under the tongue.        Allergies:  Allergies  Allergen Reactions  . Invokana [Canagliflozin]     Constant yeast infections    Past Medical History  Diagnosis Date  . Hypertension   . Diabetes mellitus   . Anxiety   . Hypercholesteremia     Past Surgical History  Procedure Laterality Date  . Cesarean section    . Abdominal hysterectomy    . Thyroid surgery    . Pituitary surgery    . Brain meningioma excision  2005    Family History  Problem Relation Age of Onset  . Diabetes Mother   . Hyperlipidemia Mother   . Hypertension Mother     Social History:  reports that she has never smoked. She has never used smokeless tobacco. She reports that she drinks about 1.8 oz of alcohol per week. She reports that she does not use illicit drugs.  Review of Systems:  Hypertension:  currently is taking Benicar HCT/amlodipine combination with usually good control  Renal function has been normal  Lipids: Taking low dose simvastatin for some time with adequate control  Lab Results  Component Value Date   CHOL 150 06/25/2014   HDL 41.20 06/25/2014   LDLCALC 94 06/25/2014   TRIG 74.0 06/25/2014   CHOLHDL 4 06/25/2014    No recent symptoms of neuropathy Diabetic shoe prescription has been given previously   Examination:   BP 138/80 mmHg  Pulse 91  Temp(Src) 98.2 F (36.8 C)  Resp 14  Ht 6' (1.829 m)  Wt 273 lb 12.8 oz (124.195 kg)  BMI 37.13 kg/m2  SpO2 94%  Body mass index is 37.13 kg/(m^2).    ASSESSMENT/ PLAN:    Diabetes type 2 with obesity Blood glucose control appears to be overall worse See history of present illness for detailed discussion of his current management, blood sugar patterns and problems identified She has been irregular with her follow-up and compliance with glucose monitoring as directed as well as taking Victoza and suppertime insulin She has marked increase  in blood sugars after evening meal and she still does not understand the need to cover all meals with rapid acting insulin Today she is complaining about the cost of Victoza She does not have consistent meals or exercise regimen Fasting readings are variable and not clear why  Recommendations today:  She will start taking Apidra consistently at suppertime and adjust her mealtime insulin based on meal size, carbohydrate content and discussed postprandial blood sugar targets  Restart Victoza and she will titrate up to 1.2 mg as directed  May need to reduce Toujeo if fasting blood sugars are below 90  Consistent exercise daily  Continue Apidra insulin at lunch and start covering breakfast also since she is getting significant amount of carbohydrate  May continue metformin at 1500 mg a day  Counseling time on subjects discussed above is over 50% of today's 25 minute visit   Patient Instructions  Check blood sugars on waking up  3 times a week Also check blood sugars about 2 hours after a meal and do this after different meals by rotation  Recommended blood sugar levels on waking up is 90-130 and about 2 hours after meal is 130-160  Please bring your blood sugar monitor to each visit, thank you  Victoza 0.6, then 1.2 then 1.8 in 3 day intervals  When sugars in  am < 90 reduce Toujeo by 5  Apidra 20-25 at supper, 10 units with oatnmeal  Walk 30 min briskly daily    Vedant Shehadeh 02/11/2015, 4:25 PM

## 2015-03-26 ENCOUNTER — Other Ambulatory Visit: Payer: Self-pay | Admitting: Endocrinology

## 2015-04-08 ENCOUNTER — Other Ambulatory Visit (INDEPENDENT_AMBULATORY_CARE_PROVIDER_SITE_OTHER): Payer: Managed Care, Other (non HMO)

## 2015-04-08 DIAGNOSIS — Z794 Long term (current) use of insulin: Secondary | ICD-10-CM | POA: Diagnosis not present

## 2015-04-08 DIAGNOSIS — E1165 Type 2 diabetes mellitus with hyperglycemia: Secondary | ICD-10-CM

## 2015-04-08 LAB — BASIC METABOLIC PANEL
BUN: 13 mg/dL (ref 6–23)
CO2: 35 meq/L — AB (ref 19–32)
Calcium: 9.5 mg/dL (ref 8.4–10.5)
Chloride: 103 mEq/L (ref 96–112)
Creatinine, Ser: 0.97 mg/dL (ref 0.40–1.20)
GFR: 76.07 mL/min (ref >60.00)
GLUCOSE: 127 mg/dL — AB (ref 70–99)
POTASSIUM: 3.6 meq/L (ref 3.5–5.1)
SODIUM: 142 meq/L (ref 135–145)

## 2015-04-08 LAB — MICROALBUMIN / CREATININE URINE RATIO
CREATININE, U: 251.7 mg/dL
MICROALB/CREAT RATIO: 0.3 mg/g (ref 0.0–30.0)
Microalb, Ur: 0.8 mg/dL (ref 0.0–1.9)

## 2015-04-09 LAB — FRUCTOSAMINE: FRUCTOSAMINE: 285 umol/L (ref 0–285)

## 2015-04-13 ENCOUNTER — Ambulatory Visit: Payer: Self-pay | Admitting: Endocrinology

## 2015-04-14 ENCOUNTER — Ambulatory Visit (INDEPENDENT_AMBULATORY_CARE_PROVIDER_SITE_OTHER): Payer: Managed Care, Other (non HMO) | Admitting: Endocrinology

## 2015-04-14 ENCOUNTER — Encounter: Payer: Self-pay | Admitting: Endocrinology

## 2015-04-14 VITALS — BP 138/84 | HR 100 | Temp 98.4°F | Resp 14 | Ht 72.0 in | Wt 268.4 lb

## 2015-04-14 DIAGNOSIS — I10 Essential (primary) hypertension: Secondary | ICD-10-CM

## 2015-04-14 DIAGNOSIS — Z794 Long term (current) use of insulin: Secondary | ICD-10-CM | POA: Diagnosis not present

## 2015-04-14 DIAGNOSIS — E1165 Type 2 diabetes mellitus with hyperglycemia: Secondary | ICD-10-CM

## 2015-04-14 NOTE — Patient Instructions (Signed)
Check blood sugars on waking up 3  times a week Also check blood sugars about 2 hours after a meal and do this after different meals by rotation  Recommended blood sugar levels on waking up is 90-130 and about 2 hours after meal is 130-160  Please bring your blood sugar monitor to each visit, thank you  Toujeo 55, keep am sugar 90-130 range  Apidra 15 units unless eating larger meals

## 2015-04-14 NOTE — Progress Notes (Signed)
Patient ID: Destiny Beasley, female   DOB: 28-Nov-1955, 59 y.o.   MRN: [id number]   Reason for Appointment: Type II Diabetes follow-up   History of Present Illness    Date of diagnosis: 10/2008  Previous history: She had markedly increased blood sugars at diagnosis and was tried on oral hypoglycemic drugs and Victoza for about 9 months before starting an insulin.  Since 2011 she had been on basal bolus insulin regimen Previously had had difficulty controlling her diabetes mostly because of difficulty with compliance with various aspects of self-care.   Recent history:   Insulin regimen: Toujeo 60 units at bedtime. Apidra 20 ac twice a day    She generally tends to have poorly controlled diabetes despite taking basal bolus insulin, Victoza  On her last visit she was told to start being better compliant with her medications and restart Victoza which she had stopped. She does not have any nausea with this using 1.2 mg,; does have improved satiety however and appears to be having beneficial effects on her glucose Also not complaining about any side effects from metformin now Her fructosamine indicates improved readings with results to 85        Current blood sugar patterns and problems:  She is checking blood sugars very sporadically and only 7 times in the last month  She has a couple of readings around 150 in the morning but today was 67  She now said that she feels shaky and hungry before lunch and dinner frequently  Usually not eating much at suppertime and not taking Apidra in the evening  Fasting glucose was 127 in the lab  She says she has been trying to walk or use the treadmill for exercise  Has lost weight    mostly overnight and in the mornings and some late in the evenings  For some reason now she is not taking any insulin at suppertime for her main meal and is taking Apidra only at lunch  Her blood sugars are variable in the morning and not clear why some  of: She may be eating late at night  She has markedly increased blood sugars after her evening meal usually  She also has a couple of high readings mid-day and afternoon but only 1 reading of 154 that is near normal in the evening  Oral hypoglycemic drugs: Metformin 1500 mg a day     Glucose readings from download  Recent range 67-154 with only 7 readings averaging 123 with blood sugars mostly late at night or mornings  Side effects from medications: ? myalgia from [facility].  Candidiasis from [place].        Physical activity: exercise: walking 3-4/5, or treadmill            Dietician visit: Most recent: 04/2010  Oatmeal in am   Weight control:  Wt Readings from Last 3 Encounters:  04/14/15 268 lb 6.4 oz (121.745 kg)  02/11/15 273 lb 12.8 oz (124.195 kg)  06/29/14 269 lb 3.2 oz (122.108 kg)         Diabetes labs:  Lab Results  Component Value Date   HGBA1C 9.3* 02/08/2015   HGBA1C 8.8* 06/25/2014   HGBA1C 10.2* 04/14/2014   Lab Results  Component Value Date   MICROALBUR 0.8 04/08/2015   LDLCALC 94 06/25/2014   CREATININE 0.97 04/08/2015   No visits with results within 1 Day(s) from this visit. Latest known visit with results is:  Appointment on 04/08/2015  Component  Date Value Ref Range Status  . Sodium 04/08/2015 142  135 - 145 mEq/L Final  . Potassium 04/08/2015 3.6  3.5 - 5.1 mEq/L Final  . Chloride 04/08/2015 103  96 - 112 mEq/L Final  . CO2 04/08/2015 35* 19 - 32 mEq/L Final  . Glucose, Bld 04/08/2015 127* 70 - 99 mg/dL Final  . BUN 04/08/2015 13  6 - 23 mg/dL Final  . Creatinine, Ser 04/08/2015 0.97  0.40 - 1.20 mg/dL Final  . Calcium 04/08/2015 9.5  8.4 - 10.5 mg/dL Final  . GFR 04/08/2015 76.07  >60.00 mL/min Final  . Fructosamine 04/08/2015 285  0 - 285 umol/L Final   Comment: Published reference interval for apparently healthy subjects between age 3 and 69 is 55 - 285 umol/L and in a poorly controlled diabetic population is 228 - 563  umol/L with a mean of 396 umol/L.   Marland Kitchen Microalb, Ur 04/08/2015 0.8  0.0 - 1.9 mg/dL Final  . Creatinine,U 04/08/2015 251.7   Final  . Microalb Creat Ratio 04/08/2015 0.3  0.0 - 30.0 mg/g Final       Medication List       This list is accurate as of: 04/14/15  9:53 AM.  Always use your most recent med list.               DULERA 100-5 MCG/ACT Aero  Generic drug:  mometasone-formoterol     fluconazole 150 MG tablet  Commonly known as:  DIFLUCAN  TAKE 1 TABLET BY MOUTH AS ONE DOSE     glucose blood test strip  Commonly known as:  ONETOUCH VERIO  USE AS DIRECTED TO CHECK BLOOD SUGARS THREE TIMES DAILY Dx code E11.65     ibuprofen 600 MG tablet  Commonly known as:  ADVIL,MOTRIN     Insulin Glargine 300 UNIT/ML Sopn  Commonly known as:  TOUJEO SOLOSTAR  Inject 70 Units into the skin daily.     TOUJEO SOLOSTAR 300 UNIT/ML Sopn  Generic drug:  Insulin Glargine  INJECT 70 UNITS INTO THE SKIN EVERY DAY     insulin glulisine 100 UNIT/ML injection  Commonly known as:  APIDRA  Inject 5-20 Units into the skin 2 (two) times daily. 5 units in am and 20 units with lunch     Insulin Pen Needle 31G X 5 MM Misc  Commonly known as:  B-D UF III MINI PEN NEEDLES  Use 4 per day     Liraglutide 18 MG/3ML Sopn  Commonly known as:  VICTOZA  INJECT 1.8 MG UNDER THE SKIN DAILY     metFORMIN 500 MG tablet  Commonly known as:  GLUCOPHAGE  TAKE 1 TABLET BY MOUTH TWICE DAILY WITH A MEAL     multivitamin with minerals Tabs tablet  Take 1 tablet by mouth daily.     pantoprazole 40 MG tablet  Commonly known as:  PROTONIX  Take 40 mg by mouth daily.     simvastatin 20 MG tablet  Commonly known as:  ZOCOR     TRIBENZOR 40-10-25 MG Tabs  Generic drug:  Olmesartan-Amlodipine-HCTZ  Take 1 tablet by mouth daily.     valACYclovir 1000 MG tablet  Commonly known as:  VALTREX     venlafaxine 75 MG tablet  Commonly known as:  EFFEXOR  Take 75 mg by mouth daily.     venlafaxine XR 150 MG  24 hr capsule  Commonly known as:  EFFEXOR-XR  Take 150 mg by mouth daily.  Vitamin B-12 2500 MCG Subl  Place under the tongue.        Allergies:  Allergies  Allergen Reactions  . Invokana [Canagliflozin]     Constant yeast infections    Past Medical History  Diagnosis Date  . Hypertension   . Diabetes mellitus   . Anxiety   . Hypercholesteremia     Past Surgical History  Procedure Laterality Date  . Cesarean section    . Abdominal hysterectomy    . Thyroid surgery    . Pituitary surgery    . Brain meningioma excision  2005    Family History  Problem Relation Age of Onset  . Diabetes Mother   . Hyperlipidemia Mother   . Hypertension Mother     Social History:  reports that she has never smoked. She has never used smokeless tobacco. She reports that she drinks about 1.8 oz of alcohol per week. She reports that she does not use illicit drugs.  Review of Systems:  Hypertension:  currently is taking Benicar HCT/amlodipine combination with usually good control  Renal function has been normal  Lipids: Taking low dose simvastatin for some time with adequate control  Lab Results  Component Value Date   CHOL 150 06/25/2014   HDL 41.20 06/25/2014   LDLCALC 94 06/25/2014   TRIG 74.0 06/25/2014   CHOLHDL 4 06/25/2014    No recent symptoms of neuropathy Diabetic shoe prescription has been given previously   Examination:   BP 138/84 mmHg  Pulse 100  Temp(Src) 98.4 F (36.9 C)  Resp 14  Ht 6' (1.829 m)  Wt 268 lb 6.4 oz (121.745 kg)  BMI 36.39 kg/m2  SpO2 95%  Body mass index is 36.39 kg/(m^2).    ASSESSMENT/ PLAN:    Diabetes type 2 with obesity See history of present illness for detailed discussion of his current management, blood sugar patterns and problems identified She has been much better compliant with doing medications and insulin as directed With taking Victoza 1.2 mg daily she has had improve satiety and some weight loss Also has been  getting to feel hypoglycemic and has already reduced her insulin by up to 20 units on the basal She thinks she is eating smaller portions and may not need as much mealtime coverage also; currently taking 20 units Apidra with meals She is also doing better with physical activity and is appearing more motivated overall Fructosamine is upper normal regimen She does however forget to check her blood sugars especially in the afternoons and evenings Lowest blood sugar 67 this morning  Recommendations today:  She will reduce her Apidra by 5 units of at breakfast and lunch  Start checking blood sugars consistently as directed  Reduce Toujeo by 5 units for now  Consistent exercise daily Call if having GI side effects from any medication  May continue metformin at 1500 mg a day    Patient Instructions  Check blood sugars on waking up 3  times a week Also check blood sugars about 2 hours after a meal and do this after different meals by rotation  Recommended blood sugar levels on waking up is 90-130 and about 2 hours after meal is 130-160  Please bring your blood sugar monitor to each visit, thank you  Toujeo 55, keep am sugar 90-130 range  Apidra 15 units unless eating larger meals    Jarett Dralle 04/14/2015, 9:53 AM

## 2015-04-15 ENCOUNTER — Other Ambulatory Visit: Payer: Self-pay | Admitting: Endocrinology

## 2015-04-28 NOTE — Telephone Encounter (Signed)
error 

## 2015-05-11 ENCOUNTER — Other Ambulatory Visit: Payer: Self-pay

## 2015-05-14 ENCOUNTER — Ambulatory Visit: Payer: Self-pay | Admitting: Endocrinology

## 2015-06-09 ENCOUNTER — Encounter: Payer: Self-pay | Admitting: Endocrinology

## 2015-06-10 ENCOUNTER — Other Ambulatory Visit: Payer: Self-pay

## 2015-06-15 ENCOUNTER — Ambulatory Visit: Payer: Self-pay | Admitting: Endocrinology

## 2015-06-22 ENCOUNTER — Other Ambulatory Visit: Payer: Self-pay | Admitting: *Deleted

## 2015-06-22 MED ORDER — INSULIN GLULISINE 100 UNIT/ML SOLOSTAR PEN: PEN_INJECTOR | SUBCUTANEOUS | Status: DC

## 2015-06-28 ENCOUNTER — Other Ambulatory Visit (INDEPENDENT_AMBULATORY_CARE_PROVIDER_SITE_OTHER): Payer: BLUE CROSS/BLUE SHIELD

## 2015-06-28 DIAGNOSIS — Z794 Long term (current) use of insulin: Secondary | ICD-10-CM

## 2015-06-28 DIAGNOSIS — E1165 Type 2 diabetes mellitus with hyperglycemia: Secondary | ICD-10-CM

## 2015-06-28 LAB — LIPID PANEL
CHOL/HDL RATIO: 3
Cholesterol: 154 mg/dL (ref 0–200)
HDL: 50.3 mg/dL (ref >39.00)
LDL CALC: 85 mg/dL (ref 0–99)
NONHDL: 103.22
Triglycerides: 93 mg/dL (ref 0.0–149.0)
VLDL: 18.6 mg/dL (ref 0.0–40.0)

## 2015-06-28 LAB — COMPREHENSIVE METABOLIC PANEL
ALBUMIN: 4 g/dL (ref 3.5–5.2)
ALT: 18 U/L (ref 0–35)
AST: 17 U/L (ref 0–37)
Alkaline Phosphatase: 73 U/L (ref 39–117)
BUN: 14 mg/dL (ref 6–23)
CALCIUM: 9.5 mg/dL (ref 8.4–10.5)
CHLORIDE: 102 meq/L (ref 96–112)
CO2: 32 meq/L (ref 19–32)
Creatinine, Ser: 0.9 mg/dL (ref 0.40–1.20)
GFR: 82.87 mL/min (ref >60.00)
GLUCOSE: 183 mg/dL — AB (ref 70–99)
POTASSIUM: 3.9 meq/L (ref 3.5–5.1)
SODIUM: 140 meq/L (ref 135–145)
Total Bilirubin: 0.3 mg/dL (ref 0.2–1.2)
Total Protein: 7.3 g/dL (ref 6.0–8.3)

## 2015-06-28 LAB — HEMOGLOBIN A1C: Hgb A1c MFr Bld: 8.2 % — ABNORMAL HIGH (ref 4.6–6.5)

## 2015-07-01 ENCOUNTER — Ambulatory Visit (INDEPENDENT_AMBULATORY_CARE_PROVIDER_SITE_OTHER): Payer: BLUE CROSS/BLUE SHIELD | Admitting: Endocrinology

## 2015-07-01 ENCOUNTER — Encounter: Payer: Self-pay | Admitting: Endocrinology

## 2015-07-01 VITALS — BP 124/80 | HR 100 | Temp 97.8°F | Resp 16 | Ht 72.0 in | Wt 263.6 lb

## 2015-07-01 DIAGNOSIS — I10 Essential (primary) hypertension: Secondary | ICD-10-CM

## 2015-07-01 DIAGNOSIS — Z794 Long term (current) use of insulin: Secondary | ICD-10-CM

## 2015-07-01 DIAGNOSIS — E1165 Type 2 diabetes mellitus with hyperglycemia: Secondary | ICD-10-CM | POA: Diagnosis not present

## 2015-07-01 NOTE — Patient Instructions (Signed)
Try Toujeo in ams  Cover pm Carbs with Apidra

## 2015-07-01 NOTE — Progress Notes (Signed)
Patient ID: Destiny Beasley, female   DOB: 1956/04/24, 60 y.o.   MRN: [id number]   Reason for Appointment: Type II Diabetes follow-up   History of Present Illness    Date of diagnosis: 10/2008  Previous history: She had markedly increased blood sugars at diagnosis and was tried on oral hypoglycemic drugs and Victoza for about 9 months before starting an insulin.  Since 2011 she had been on basal bolus insulin regimen Previously had had difficulty controlling her diabetes mostly because of difficulty with compliance with various aspects of self-care.   Recent history:   Insulin regimen: Toujeo 55 units at bedtime. Apidra 15 ac twice a day    She generally tends to have poorly controlled diabetes despite taking basal bolus insulin and Victoza  Her A1c is somewhat better now with 8.2 but her blood sugars are still inconsistent        Current management, blood sugar patterns and problems:  She is checking blood sugars mostly in the mornings and late in the evenings but most of the readings are in the mornings recently are overnight  She said that she is usually not eating a meal in the evening now since with taking Victoza in the afternoon she has decreased appetite  She thinks some of her high readings have been related to respiratory illness and occasional doses of prednisone recently  which raised  her blood sugar over 300  She will have somewhat lower blood sugars fasting occasionally, lowest 53 which she thinks is from eating very lightly the day before  She is not adjusting her mealtime dose based on what she is eating and carbohydrate intake  Even though she may not eat supper she may sometimes eat snacks without any insulin coverage  She has been consistent with exercise recently and appears to be losing weight again   Oral hypoglycemic drugs: Metformin 1500 mg a day     Glucose readings from download  Mean values apply above for all meters except median for One  Touch  PRE-MEAL Fasting Lunch Dinner Bedtime Overall  Glucose range:  53-310   317    102-291    Mean/median:  110     144+/-75      Dietician visit: Most recent: 04/2010   Oatmeal in am   Weight control:  Wt Readings from Last 3 Encounters:  07/01/15 263 lb 9.6 oz (119.568 kg)  04/14/15 268 lb 6.4 oz (121.745 kg)  02/11/15 273 lb 12.8 oz (124.195 kg)         Diabetes labs:  Lab Results  Component Value Date   HGBA1C 8.2* 06/28/2015   HGBA1C 9.3* 02/08/2015   HGBA1C 8.8* 06/25/2014   Lab Results  Component Value Date   MICROALBUR 0.8 04/08/2015   LDLCALC 85 06/28/2015   CREATININE 0.90 06/28/2015   No visits with results within 1 Day(s) from this visit. Latest known visit with results is:  Lab on 06/28/2015  Component Date Value Ref Range Status  . Hgb A1c MFr Bld 06/28/2015 8.2* 4.6 - 6.5 % Final   Glycemic Control Guidelines for People with Diabetes:Non Diabetic:  <6%Goal of Therapy: <7%Additional Action Suggested:  >8%   . Sodium 06/28/2015 140  135 - 145 mEq/L Final  . Potassium 06/28/2015 3.9  3.5 - 5.1 mEq/L Final  . Chloride 06/28/2015 102  96 - 112 mEq/L Final  . CO2 06/28/2015 32  19 - 32 mEq/L Final  . Glucose, Bld 06/28/2015 183* 70 - 99  mg/dL Final  . BUN 06/28/2015 14  6 - 23 mg/dL Final  . Creatinine, Ser 06/28/2015 0.90  0.40 - 1.20 mg/dL Final  . Total Bilirubin 06/28/2015 0.3  0.2 - 1.2 mg/dL Final  . Alkaline Phosphatase 06/28/2015 73  39 - 117 U/L Final  . AST 06/28/2015 17  0 - 37 U/L Final  . ALT 06/28/2015 18  0 - 35 U/L Final  . Total Protein 06/28/2015 7.3  6.0 - 8.3 g/dL Final  . Albumin 06/28/2015 4.0  3.5 - 5.2 g/dL Final  . Calcium 06/28/2015 9.5  8.4 - 10.5 mg/dL Final  . GFR 06/28/2015 82.87  >60.00 mL/min Final  . Cholesterol 06/28/2015 154  0 - 200 mg/dL Final   ATP III Classification       Desirable:  < 200 mg/dL               Borderline High:  200 - 239 mg/dL          High:  > = 240 mg/dL  . Triglycerides 06/28/2015 93.0  0.0 -  149.0 mg/dL Final   Normal:  <150 mg/dLBorderline High:  150 - 199 mg/dL  . HDL 06/28/2015 50.30  >39.00 mg/dL Final  . VLDL 06/28/2015 18.6  0.0 - 40.0 mg/dL Final  . LDL Cholesterol 06/28/2015 85  0 - 99 mg/dL Final  . Total CHOL/HDL Ratio 06/28/2015 3   Final                  Men          Women1/2 Average Risk     3.4          3.3Average Risk          5.0          4.42X Average Risk          9.6          7.13X Average Risk          15.0          11.0                      . NonHDL 06/28/2015 103.22   Final   NOTE:  Non-HDL goal should be 30 mg/dL higher than patient's LDL goal (i.e. LDL goal of < 70 mg/dL, would have non-HDL goal of < 100 mg/dL)       Medication List       This list is accurate as of: 07/01/15  4:05 PM.  Always use your most recent med list.               amLODipine 10 MG tablet  Commonly known as:  NORVASC  Take 10 mg by mouth daily.     B-D UF III MINI PEN NEEDLES 31G X 5 MM Misc  Generic drug:  Insulin Pen Needle  USE 4 DAILY     DULERA 100-5 MCG/ACT Aero  Generic drug:  mometasone-formoterol     estrogens (conjugated) 0.625 MG tablet  Commonly known as:  PREMARIN  Take 0.625 mg by mouth daily. Take daily for 21 days then do not take for 7 days.     fluconazole 150 MG tablet  Commonly known as:  DIFLUCAN  TAKE 1 TABLET BY MOUTH AS ONE DOSE     glucose blood test strip  Commonly known as:  ONETOUCH VERIO  USE AS DIRECTED TO CHECK BLOOD SUGARS THREE TIMES DAILY Dx code E11.65  hydrochlorothiazide 25 MG tablet  Commonly known as:  HYDRODIURIL  Take 25 mg by mouth daily.     ibuprofen 600 MG tablet  Commonly known as:  ADVIL,MOTRIN     Insulin Glargine 300 UNIT/ML Sopn  Commonly known as:  TOUJEO SOLOSTAR  Inject 70 Units into the skin daily.     TOUJEO SOLOSTAR 300 UNIT/ML Sopn  Generic drug:  Insulin Glargine  INJECT 70 UNITS INTO THE SKIN EVERY DAY     Insulin Glulisine 100 UNIT/ML Solostar Pen  Commonly known as:  APIDRA SOLOSTAR    Inject 15 units in the morning, 15 units at lunch and 20 units at dinner     Liraglutide 18 MG/3ML Sopn  Commonly known as:  VICTOZA  INJECT 1.8 MG UNDER THE SKIN DAILY     montelukast 10 MG tablet  Commonly known as:  SINGULAIR  Take 10 mg by mouth at bedtime.     multivitamin with minerals Tabs tablet  Take 1 tablet by mouth daily.     pantoprazole 40 MG tablet  Commonly known as:  PROTONIX  Take 40 mg by mouth daily.     rosuvastatin 20 MG tablet  Commonly known as:  CRESTOR  Take 20 mg by mouth daily.     simvastatin 20 MG tablet  Commonly known as:  ZOCOR  Reported on 07/01/2015     TRIBENZOR 40-10-25 MG Tabs  Generic drug:  Olmesartan-Amlodipine-HCTZ  Take 1 tablet by mouth daily. Reported on 07/01/2015     valACYclovir 1000 MG tablet  Commonly known as:  VALTREX     venlafaxine XR 150 MG 24 hr capsule  Commonly known as:  EFFEXOR-XR  Take 150 mg by mouth daily.     Vitamin B-12 2500 MCG Subl  Place under the tongue.        Allergies:  Allergies  Allergen Reactions  . Invokana [Canagliflozin]     Constant yeast infections    Past Medical History  Diagnosis Date  . Hypertension   . Diabetes mellitus   . Anxiety   . Hypercholesteremia     Past Surgical History  Procedure Laterality Date  . Cesarean section    . Abdominal hysterectomy    . Thyroid surgery    . Pituitary surgery    . Brain meningioma excision  2005    Family History  Problem Relation Age of Onset  . Diabetes Mother   . Hyperlipidemia Mother   . Hypertension Mother     Social History:  reports that she has never smoked. She has never used smokeless tobacco. She reports that she drinks about 1.8 oz of alcohol per week. She reports that she does not use illicit drugs.  Review of Systems:  Hypertension:   is taking Benicar HCT/amlodipine combination with usually good control   She is planning to get bariatric surgery in [place] once approved  Lipids: Taking low dose  simvastatin for some time with adequate control of LDL, normal triglycerides  Lab Results  Component Value Date   CHOL 154 06/28/2015   HDL 50.30 06/28/2015   LDLCALC 85 06/28/2015   TRIG 93.0 06/28/2015   CHOLHDL 3 06/28/2015    No recent symptoms of neuropathy Diabetic shoe prescription has been given previously   Examination:   BP 124/80 mmHg  Pulse 100  Temp(Src) 97.8 F (36.6 C)  Resp 16  Ht 6' (1.829 m)  Wt 263 lb 9.6 oz (119.568 kg)  BMI 35.74 kg/m2  SpO2 95%  Body mass index is 35.74 kg/(m^2).    ASSESSMENT/ PLAN:    Diabetes type 2 with obesity See history of present illness for detailed discussion of current management, blood sugar patterns and problems identified She has been  better compliant with her day-to-day management of diabetes including diet, insulin and Victoza  She appears to be needing more insulin to cover her basal requirement but fasting readings appear to be low normal and she has some tendency to high readings in the evenings She is afraid of hypoglycemia overnight and will not usually take mealtime insulin to cover her evening light meals With taking Victoza as well as generally watching portions and exercising she is losing weight  She thinks some of her marked variability in blood sugars is related to prednisone or intercurrent illness However she is most likely not getting consistent 24 control of her basal requirement with Toujeo  Recommendations today: She will try taking Toujeo in the mornings Adjust mealtime dose based on what she is eating especially carbohydrates May take 6-12 minutes for her evening carbohydrate intake even if not eating a proper meal More readings after breakfast and lunch Continue Victoza  If she is able to get bariatric surgery will need to see her in  Short-term follow-up afterwards Discussed that doing the surgery would be a long-term commitment to changing her lifestyle and eating habits especially to prevent  regain of weight subsequently  Counseling time on subjects discussed above is over 50% of today's 25 minute visit   Patient Instructions  Try Toujeo in ams  Cover pm Carbs with Apidra   Humbert Morozov 07/01/2015, 4:05 PM

## 2015-07-05 ENCOUNTER — Other Ambulatory Visit: Payer: Self-pay | Admitting: *Deleted

## 2015-07-05 MED ORDER — INSULIN ASPART 100 UNIT/ML FLEXPEN: PEN_INJECTOR | SUBCUTANEOUS | Status: DC

## 2015-07-13 ENCOUNTER — Other Ambulatory Visit: Payer: Self-pay | Admitting: *Deleted

## 2015-07-13 ENCOUNTER — Telehealth: Payer: Self-pay | Admitting: Endocrinology

## 2015-07-13 MED ORDER — DULAGLUTIDE 1.5 MG/0.5ML ~~LOC~~ SOAJ: SUBCUTANEOUS | Status: DC

## 2015-07-13 MED ORDER — INSULIN GLULISINE 100 UNIT/ML SOLOSTAR PEN: PEN_INJECTOR | SUBCUTANEOUS | Status: DC

## 2015-07-13 NOTE — Telephone Encounter (Signed)
Pt requests call back from [place] as to why Dr. Dwyane Dee put her on the Novolog

## 2015-07-13 NOTE — Telephone Encounter (Signed)
Her apidra was denied by her insurance, they prefer Novolog.  That's why they were switched, message left on patients voice mail letting her know

## 2015-07-13 NOTE — Telephone Encounter (Signed)
rx for trulicity sent, came back needing a PA, that was done

## 2015-07-13 NOTE — Telephone Encounter (Signed)
Patient wants to know if she can switch her Victoza to trulicity, she said the copay card for Victoza is only good for 2 years and she has a new card for trulicity. Please advise.

## 2015-07-13 NOTE — Telephone Encounter (Signed)
May switch to Trulicity.  She will use 1.5 mg weekly

## 2015-07-28 ENCOUNTER — Other Ambulatory Visit: Payer: Self-pay | Admitting: *Deleted

## 2015-08-11 ENCOUNTER — Telehealth: Payer: Self-pay | Admitting: Endocrinology

## 2015-08-11 NOTE — Telephone Encounter (Signed)
That was already faxed

## 2015-08-11 NOTE — Telephone Encounter (Signed)
PT was calling about her paperwork for the bariatric center that she gave [place].  She would like a call back

## 2015-09-11 ENCOUNTER — Other Ambulatory Visit: Payer: Self-pay | Admitting: Endocrinology

## 2015-09-21 ENCOUNTER — Other Ambulatory Visit: Payer: Self-pay | Admitting: Endocrinology

## 2015-09-23 ENCOUNTER — Telehealth: Payer: Self-pay | Admitting: Endocrinology

## 2015-09-23 NOTE — Telephone Encounter (Signed)
What weight loss regimen has she been on?  Has she seen a dietitian?

## 2015-09-23 NOTE — Telephone Encounter (Signed)
Please see below and advise.

## 2015-09-23 NOTE — Telephone Encounter (Signed)
PT is having Bariatric surgery and needs a letter from Dr. Dwyane Dee stating she has been one some sort of weight loss regimen for at least a year.  She said if there are any questions to call her or the letter can be faxed to her office. Fax: [phone]

## 2015-09-23 NOTE — Telephone Encounter (Signed)
She states that she has went to [facility] for 2 years and she saw a dietician as well.

## 2015-09-23 NOTE — Telephone Encounter (Signed)
She will have to get documentation from [facility].  She has seen a diabetes educator in 2015 only

## 2015-09-24 NOTE — Telephone Encounter (Signed)
Noted, detailed message left on her voice mail 

## 2015-09-28 ENCOUNTER — Other Ambulatory Visit (INDEPENDENT_AMBULATORY_CARE_PROVIDER_SITE_OTHER): Payer: BLUE CROSS/BLUE SHIELD

## 2015-09-28 DIAGNOSIS — Z794 Long term (current) use of insulin: Secondary | ICD-10-CM

## 2015-09-28 DIAGNOSIS — E1165 Type 2 diabetes mellitus with hyperglycemia: Secondary | ICD-10-CM | POA: Diagnosis not present

## 2015-09-28 LAB — COMPREHENSIVE METABOLIC PANEL
ALT: 23 U/L (ref 0–35)
AST: 20 U/L (ref 0–37)
Albumin: 4.3 g/dL (ref 3.5–5.2)
Alkaline Phosphatase: 76 U/L (ref 39–117)
BUN: 21 mg/dL (ref 6–23)
CHLORIDE: 101 meq/L (ref 96–112)
CO2: 30 meq/L (ref 19–32)
Calcium: 9.7 mg/dL (ref 8.4–10.5)
Creatinine, Ser: 1.01 mg/dL (ref 0.40–1.20)
GFR: 72.48 mL/min (ref >60.00)
GLUCOSE: 252 mg/dL — AB (ref 70–99)
POTASSIUM: 4.1 meq/L (ref 3.5–5.1)
SODIUM: 137 meq/L (ref 135–145)
TOTAL PROTEIN: 7.8 g/dL (ref 6.0–8.3)
Total Bilirubin: 0.3 mg/dL (ref 0.2–1.2)

## 2015-09-28 LAB — HEMOGLOBIN A1C: HEMOGLOBIN A1C: 9.3 % — AB (ref 4.6–6.5)

## 2015-10-01 ENCOUNTER — Encounter: Payer: Self-pay | Admitting: Endocrinology

## 2015-10-01 ENCOUNTER — Telehealth: Payer: Self-pay | Admitting: Endocrinology

## 2015-10-01 ENCOUNTER — Ambulatory Visit (INDEPENDENT_AMBULATORY_CARE_PROVIDER_SITE_OTHER): Payer: BLUE CROSS/BLUE SHIELD | Admitting: Endocrinology

## 2015-10-01 VITALS — BP 136/84 | HR 101 | Ht 72.0 in | Wt 265.0 lb

## 2015-10-01 DIAGNOSIS — E1165 Type 2 diabetes mellitus with hyperglycemia: Secondary | ICD-10-CM | POA: Diagnosis not present

## 2015-10-01 DIAGNOSIS — Z794 Long term (current) use of insulin: Secondary | ICD-10-CM | POA: Diagnosis not present

## 2015-10-01 MED ORDER — PHENTERMINE-TOPIRAMATE ER 3.75-23 MG PO CP24: ORAL_CAPSULE | ORAL | Status: DC

## 2015-10-01 NOTE — Progress Notes (Signed)
Patient ID: Destiny Beasley, female   DOB: 1956/02/18, 60 y.o.   MRN: [id number]   Reason for Appointment: Type II Diabetes follow-up   History of Present Illness    Date of diagnosis: 10/2008  Previous history: She had markedly increased blood sugars at diagnosis and was tried on oral hypoglycemic drugs and Victoza for about 9 months before starting an insulin.  Since 2011 she had been on basal bolus insulin regimen Previously had had difficulty controlling her diabetes mostly because of difficulty with compliance with various aspects of self-care.   Recent history:   Insulin regimen: Toujeo 70 units at bedtime. Apidra 22 ac 3x a day    She generally tends to have poorly controlled diabetes despite taking basal bolus insulin and Victoza  Her A1c is much worse at 9.3, previously had come down to 8.2        Current management, blood sugar patterns and problems:  She is checking blood sugars again mostly in the mornings and late in the evenings   Although her blood sugars are fairly good on the last visit especially fasting she has a high readings and she has increased her insulin doses including Toujeo on her own  She now says that she has been eating large snacks in the evenings after supper  Now she is not taking any breakfast but she will take 22 Apidra in the morning if blood sugar is high  She does not adjust her suppertime dose based on what she is eating but will take extra insulin if her blood sugar is later higher  She is not losing weight despite exercising  She is not taking any oral hypoglycemic drugs.  She thinks she has diarrhea with metformin and will not want to try it.  She also had recurrent yeast infection with Invokana and does not want to try to gain  Although she has been looking at bariatric surgery she will not be able to do this for a year  Oral hypoglycemic drugs:   none  Glucose readings from download  Mean values apply above for all meters  except median for One Touch  PRE-MEAL Fasting Lunch Dinner Bedtime Overall  Glucose range: 60-249    167-260    Mean/median:    195  209+/-78    POST-MEAL PC Breakfast PC Lunch PC Dinner  Glucose range:   215-373   Mean/median:   280      Dietician visit: Most recent: 04/2010 Weight control:  Wt Readings from Last 3 Encounters:  10/01/15 265 lb (120.203 kg)  07/01/15 263 lb 9.6 oz (119.568 kg)  04/14/15 268 lb 6.4 oz (121.745 kg)         Diabetes labs:  Lab Results  Component Value Date   HGBA1C 9.3* 09/28/2015   HGBA1C 8.2* 06/28/2015   HGBA1C 9.3* 02/08/2015   Lab Results  Component Value Date   MICROALBUR 0.8 04/08/2015   LDLCALC 85 06/28/2015   CREATININE 1.01 09/28/2015   No visits with results within 1 Day(s) from this visit. Latest known visit with results is:  Lab on 09/28/2015  Component Date Value Ref Range Status  . Hgb A1c MFr Bld 09/28/2015 9.3* 4.6 - 6.5 % Final   Glycemic Control Guidelines for People with Diabetes:Non Diabetic:  <6%Goal of Therapy: <7%Additional Action Suggested:  >8%   . Sodium 09/28/2015 137  135 - 145 mEq/L Final  . Potassium 09/28/2015 4.1  3.5 - 5.1 mEq/L Final  . Chloride 09/28/2015  101  96 - 112 mEq/L Final  . CO2 09/28/2015 30  19 - 32 mEq/L Final  . Glucose, Bld 09/28/2015 252* 70 - 99 mg/dL Final  . BUN 09/28/2015 21  6 - 23 mg/dL Final  . Creatinine, Ser 09/28/2015 1.01  0.40 - 1.20 mg/dL Final  . Total Bilirubin 09/28/2015 0.3  0.2 - 1.2 mg/dL Final  . Alkaline Phosphatase 09/28/2015 76  39 - 117 U/L Final  . AST 09/28/2015 20  0 - 37 U/L Final  . ALT 09/28/2015 23  0 - 35 U/L Final  . Total Protein 09/28/2015 7.8  6.0 - 8.3 g/dL Final  . Albumin 09/28/2015 4.3  3.5 - 5.2 g/dL Final  . Calcium 09/28/2015 9.7  8.4 - 10.5 mg/dL Final  . GFR 09/28/2015 72.48  >60.00 mL/min Final       Medication List       This list is accurate as of: 10/01/15  1:06 PM.  Always use your most recent med list.                amLODipine 10 MG tablet  Commonly known as:  NORVASC  Take 10 mg by mouth daily.     B-D UF III MINI PEN NEEDLES 31G X 5 MM Misc  Generic drug:  Insulin Pen Needle  USE 4 DAILY     DULERA 100-5 MCG/ACT Aero  Generic drug:  mometasone-formoterol     estrogens (conjugated) 0.625 MG tablet  Commonly known as:  PREMARIN  Take 0.625 mg by mouth daily. Take daily for 21 days then do not take for 7 days.     glucose blood test strip  Commonly known as:  ONETOUCH VERIO  USE AS DIRECTED TO CHECK BLOOD SUGARS THREE TIMES DAILY Dx code E11.65     hydrochlorothiazide 25 MG tablet  Commonly known as:  HYDRODIURIL  Take 25 mg by mouth daily.     ibuprofen 600 MG tablet  Commonly known as:  ADVIL,MOTRIN     Insulin Glargine 300 UNIT/ML Sopn  Commonly known as:  TOUJEO SOLOSTAR  Inject 70 Units into the skin daily.     Insulin Glulisine 100 UNIT/ML Solostar Pen  Commonly known as:  APIDRA SOLOSTAR  Inject 15 units in the morning, 15 units at lunch and 20 units at dinner     montelukast 10 MG tablet  Commonly known as:  SINGULAIR  Take 10 mg by mouth at bedtime.     multivitamin with minerals Tabs tablet  Take 1 tablet by mouth daily.     olmesartan 20 MG tablet  Commonly known as:  BENICAR  Take 20 mg by mouth daily.     pantoprazole 40 MG tablet  Commonly known as:  PROTONIX  Take 40 mg by mouth daily.     Phentermine-Topiramate 3.75-23 MG Cp24  Commonly known as:  QSYMIA  1 daily     rosuvastatin 20 MG tablet  Commonly known as:  CRESTOR  Take 20 mg by mouth daily.     simvastatin 20 MG tablet  Commonly known as:  ZOCOR  Reported on 07/01/2015     TRIBENZOR 40-10-25 MG Tabs  Generic drug:  Olmesartan-Amlodipine-HCTZ  Take 1 tablet by mouth daily. Reported on 07/01/2015     valACYclovir 1000 MG tablet  Commonly known as:  VALTREX     venlafaxine XR 150 MG 24 hr capsule  Commonly known as:  EFFEXOR-XR  Take 150 mg by mouth daily.  VICTOZA 18 MG/3ML Sopn    Generic drug:  Liraglutide     Vitamin B-12 2500 MCG Subl  Place under the tongue.     VITAMIN D-3 PO  Take by mouth.        Allergies:  Allergies  Allergen Reactions  . Invokana [Canagliflozin]     Constant yeast infections    Past Medical History  Diagnosis Date  . Hypertension   . Diabetes mellitus   . Anxiety   . Hypercholesteremia     Past Surgical History  Procedure Laterality Date  . Cesarean section    . Abdominal hysterectomy    . Thyroid surgery    . Pituitary surgery    . Brain meningioma excision  2005    Family History  Problem Relation Age of Onset  . Diabetes Mother   . Hyperlipidemia Mother   . Hypertension Mother     Social History:  reports that she has never smoked. She has never used smokeless tobacco. She reports that she drinks about 1.8 oz of alcohol per week. She reports that she does not use illicit drugs.  Review of Systems:  Hypertension:   is taking Benicar HCT/amlodipine combination with usually good control   She is planning to get bariatric surgery in [place] next year  Lipids: Taking low dose simvastatin for some time with adequate control of LDL, normal triglycerides  Lab Results  Component Value Date   CHOL 154 06/28/2015   HDL 50.30 06/28/2015   LDLCALC 85 06/28/2015   TRIG 93.0 06/28/2015   CHOLHDL 3 06/28/2015    No recent symptoms of neuropathy Diabetic shoe prescription has been given previously   Examination:   BP 136/84 mmHg  Pulse 101  Ht 6' (1.829 m)  Wt 265 lb (120.203 kg)  BMI 35.93 kg/m2  SpO2 99%  Body mass index is 35.93 kg/(m^2).    ASSESSMENT/ PLAN:    Diabetes type 2 with obesity See history of present illness for detailed discussion of current management, blood sugar patterns and problems identified Her blood sugars are poorly controlled and probably needing more insulin with her stopping metformin Despite taking Victoza she is not able to control her portions and snacks in the  evenings Not clear if her blood sugars are going up higher after lunch as she does not do any readings in the afternoons or evenings Has fairly consistently high readings after supper  Recommendations today: She will try taking Toujeo in the mornings, for now take the same dose Increase Apidra by at least 4 units at suppertime Start Qsymia 3.75 mg daily for the next 2 weeks and if tolerated she will call for prescription for the 7.5 mg prescription More blood sugars at different times Improve diet and reduce carbohydrate intake in the evening along with low fat intake Consider consultation with dietitian   Counseling time on subjects discussed above is over 50% of today's 25 minute visit   Patient Instructions  Toujeo 70 units in am  Apidra 26 at supper  Check blood sugars on waking up   times a week Also check blood sugars about 2 hours after a meal and do this after different meals by rotation  Recommended blood sugar levels on waking up is 90-130 and about 2 hours after meal is 130-160  Please bring your blood sugar monitor to each visit, thank you  Call in 10 days if Qsymia works   [facility] 10/01/2015, 1:06 PM

## 2015-10-01 NOTE — Patient Instructions (Addendum)
Toujeo 70 units in am  Apidra 26 at supper  Check blood sugars on waking up   times a week Also check blood sugars about 2 hours after a meal and do this after different meals by rotation  Recommended blood sugar levels on waking up is 90-130 and about 2 hours after meal is 130-160  Please bring your blood sugar monitor to each visit, thank you  Call in 10 days if Qsymia works

## 2015-10-01 NOTE — Telephone Encounter (Signed)
Pt needs PA on the med that was written for her today

## 2015-10-04 NOTE — Telephone Encounter (Signed)
Qsymia is not covered under the pt's insurance. Please advise on how to proceed.

## 2015-10-05 NOTE — Telephone Encounter (Signed)
I contacted the pt and advised of note below. Pt stated she would contact her insurance company and call us back with the information she receives.

## 2015-10-05 NOTE — Telephone Encounter (Signed)
She needs to find out if Belviq or Contrave are covered

## 2015-10-18 ENCOUNTER — Telehealth: Payer: Self-pay | Admitting: Family Medicine

## 2015-10-18 NOTE — Telephone Encounter (Signed)
Patient would like a refill on her Qsymia medication.

## 2015-10-20 ENCOUNTER — Telehealth: Payer: Self-pay | Admitting: Endocrinology

## 2015-10-20 MED ORDER — PHENTERMINE-TOPIRAMATE ER 7.5-46 MG PO CP24: 1.0000 | ORAL_CAPSULE | Freq: Every day | ORAL | Status: DC

## 2015-10-20 NOTE — Telephone Encounter (Signed)
Pt calling with questions regarding the Qsymia medication

## 2015-10-20 NOTE — Telephone Encounter (Signed)
Pt states that she's tolerating the Qsymia well with no issues and she would like to get a refill and move up to the next dose. Pt was here on 6/01. Thank you.

## 2015-10-20 NOTE — Telephone Encounter (Signed)
Please print the Prescription for the Qsymia 7.5 dosage

## 2015-10-20 NOTE — Telephone Encounter (Signed)
Rx printed will fax once Md signs.

## 2015-10-21 ENCOUNTER — Telehealth: Payer: Self-pay | Admitting: Endocrinology

## 2015-10-21 NOTE — Telephone Encounter (Signed)
PA faxed for Qsymia.

## 2015-10-21 NOTE — Telephone Encounter (Signed)
Pa needed for qsymia 7.5. Pa placed on you desk.

## 2015-10-21 NOTE — Telephone Encounter (Signed)
Patient stated that her pharmacy haven't the fax for the medication Qsymia 7.5 dosage, please advise

## 2015-10-21 NOTE — Telephone Encounter (Signed)
done

## 2015-10-21 NOTE — Telephone Encounter (Signed)
Rx faxed to [facility] 

## 2015-11-08 ENCOUNTER — Other Ambulatory Visit: Payer: Self-pay | Admitting: Endocrinology

## 2015-11-11 ENCOUNTER — Other Ambulatory Visit: Payer: Self-pay

## 2015-11-11 MED ORDER — INSULIN LISPRO 100 UNIT/ML (KWIKPEN): PEN_INJECTOR | SUBCUTANEOUS | Status: DC

## 2015-11-26 ENCOUNTER — Other Ambulatory Visit (INDEPENDENT_AMBULATORY_CARE_PROVIDER_SITE_OTHER): Payer: BLUE CROSS/BLUE SHIELD

## 2015-11-26 ENCOUNTER — Other Ambulatory Visit: Payer: Self-pay | Admitting: Endocrinology

## 2015-11-26 DIAGNOSIS — Z794 Long term (current) use of insulin: Secondary | ICD-10-CM

## 2015-11-26 DIAGNOSIS — E1165 Type 2 diabetes mellitus with hyperglycemia: Secondary | ICD-10-CM | POA: Diagnosis not present

## 2015-11-26 LAB — BASIC METABOLIC PANEL
BUN: 24 mg/dL — ABNORMAL HIGH (ref 6–23)
CHLORIDE: 104 meq/L (ref 96–112)
CO2: 29 meq/L (ref 19–32)
Calcium: 9.6 mg/dL (ref 8.4–10.5)
Creatinine, Ser: 1.11 mg/dL (ref 0.40–1.20)
GFR: 64.97 mL/min (ref >60.00)
Glucose, Bld: 170 mg/dL — ABNORMAL HIGH (ref 70–99)
POTASSIUM: 4.1 meq/L (ref 3.5–5.1)
SODIUM: 141 meq/L (ref 135–145)

## 2015-11-27 LAB — FRUCTOSAMINE: Fructosamine: 288 umol/L — ABNORMAL HIGH (ref 0–285)

## 2015-12-01 ENCOUNTER — Encounter: Payer: Self-pay | Admitting: Endocrinology

## 2015-12-01 ENCOUNTER — Ambulatory Visit (INDEPENDENT_AMBULATORY_CARE_PROVIDER_SITE_OTHER): Payer: BLUE CROSS/BLUE SHIELD | Admitting: Endocrinology

## 2015-12-01 VITALS — BP 122/80 | HR 88 | Ht 72.0 in | Wt 260.0 lb

## 2015-12-01 DIAGNOSIS — I10 Essential (primary) hypertension: Secondary | ICD-10-CM | POA: Diagnosis not present

## 2015-12-01 DIAGNOSIS — E1165 Type 2 diabetes mellitus with hyperglycemia: Secondary | ICD-10-CM | POA: Diagnosis not present

## 2015-12-01 DIAGNOSIS — Z794 Long term (current) use of insulin: Secondary | ICD-10-CM

## 2015-12-01 MED ORDER — PHENTERMINE-TOPIRAMATE 15-92 MG PO CP24
ORAL_CAPSULE | ORAL | 2 refills | Status: DC
Start: 2015-12-01 — End: 2016-02-08

## 2015-12-01 MED ORDER — DULAGLUTIDE 1.5 MG/0.5ML ~~LOC~~ SOAJ: 1.5000 mg | SUBCUTANEOUS | 2 refills | Status: DC

## 2015-12-01 NOTE — Patient Instructions (Signed)
Check blood sugars on waking up  daily  Also check blood sugars about 2 hours after a meal and do this after different meals by rotation  MUST DO MORE SUGARS AFTER MEALS  Recommended blood sugar levels on waking up is 90-130 and about 2 hours after meal is 130-160  Please bring your blood sugar monitor to each visit, thank you  Start exercise

## 2015-12-01 NOTE — Addendum Note (Signed)
Addended by: Verlin Grills T on: 12/01/2015 02:46 PM   Modules accepted: Orders

## 2015-12-01 NOTE — Progress Notes (Signed)
Patient ID: Destiny Beasley, female   DOB: [birth year], 60 y.o.   MRN: [id number]   Reason for Appointment: Type II Diabetes follow-up   History of Present Illness    Date of diagnosis: 10/2008  Previous history: She had markedly increased blood sugars at diagnosis and was tried on oral hypoglycemic drugs and Victoza for about 9 months before starting an insulin.  Since 2011 she had been on basal bolus insulin regimen Previously had had difficulty controlling her diabetes mostly because of difficulty with compliance with various aspects of self-care.   Recent history:   Insulin regimen: Toujeo 55 units at . Apidra 22 ac   Her A1c on her last visit was 9.3 Fructosamine recently is 288        Current management, blood sugar patterns and problems:  She is now taking Qsymia since 6/17  She thinks it does help her cut back on her portions and snacking in the evenings although she is still not able to control it adequately  On her own she has started cutting back on her Toujeo insulin since she was tending to get low sugars overnight and was previously taking 15 units more  Again checking blood sugars  mostly in the mornings and only occasionally late in the evenings   She has had mostly high readings in the evenings but has only has 3 recent readings, highest 382  She does not know why her sugars are higher at times including in the mornings and is blaming this on her respiratory infection but does not think she got any steroids with this  She still takes Apidra in the evenings for meals but may have snacks later    Although her blood sugars are fairly good on the last visit especially fasting she has a high readings and she has increased her insulin doses including Toujeo on her own  She now says that she has been eating large snacks in the evenings after supper  Now she is not taking any breakfast but she will take 22 Apidra in the morning if blood sugar is high  She  does not adjust her suppertime dose based on what she is eating but will take extra insulin if her blood sugar is later higher, did have a low sugar at 2 AM because of this  She has finally started losing a little weight even with her sugars being overall lower She is not taking any oral hypoglycemic drugs.  Oral hypoglycemic drugs:   none  Glucose readings from download  Mean values apply above for all meters except median for One Touch  PRE-MEAL Fasting Lunch Dinner Bedtime Overall  Glucose range: 54-302    188-382    Mean/median: 170    175    Exercise: None recently    Dietician visit: Most recent: 04/2010 Weight control:  Wt Readings from Last 3 Encounters:  12/01/15 260 lb (117.9 kg)  10/01/15 265 lb (120.2 kg)  07/01/15 263 lb 9.6 oz (119.6 kg)         Diabetes labs:  Lab Results  Component Value Date   HGBA1C 9.3 (H) 09/28/2015   HGBA1C 8.2 (H) 06/28/2015   HGBA1C 9.3 (H) 02/08/2015   Lab Results  Component Value Date   MICROALBUR 0.8 04/08/2015   LDLCALC 85 06/28/2015   CREATININE 1.11 11/26/2015   No visits with results within 1 Day(s) from this visit.  Latest known visit with results is:  Lab on 11/26/2015  Component Date  Value Ref Range Status  . Sodium 11/26/2015 141  135 - 145 mEq/L Final  . Potassium 11/26/2015 4.1  3.5 - 5.1 mEq/L Final  . Chloride 11/26/2015 104  96 - 112 mEq/L Final  . CO2 11/26/2015 29  19 - 32 mEq/L Final  . Glucose, Bld 11/26/2015 170* 70 - 99 mg/dL Final  . BUN 11/26/2015 24* 6 - 23 mg/dL Final  . Creatinine, Ser 11/26/2015 1.11  0.40 - 1.20 mg/dL Final  . Calcium 11/26/2015 9.6  8.4 - 10.5 mg/dL Final  . GFR 11/26/2015 64.97  >60.00 mL/min Final  . Fructosamine 11/27/2015 288* 0 - 285 umol/L Final   Comment: Published reference interval for apparently healthy subjects between age 75 and 9 is 33 - 285 umol/L and in a poorly controlled diabetic population is 228 - 563 umol/L with a mean of 396 umol/L.          Medication List       Accurate as of 12/01/15  8:32 AM. Always use your most recent med list.          amLODipine 10 MG tablet Commonly known as:  NORVASC Take 10 mg by mouth daily.   APIDRA SOLOSTAR 100 UNIT/ML Solostar Pen Generic drug:  Insulin Glulisine INEJCT 15 UNITS INTO THE SKIN IN THE MORNING, 15 UNITS AT LUNCH AND 20 UNITS AT DINNER   B-D UF III MINI PEN NEEDLES 31G X 5 MM Misc Generic drug:  Insulin Pen Needle USE 4 DAILY   DULERA 100-5 MCG/ACT Aero Generic drug:  mometasone-formoterol   estrogens (conjugated) 0.625 MG tablet Commonly known as:  PREMARIN Take 0.625 mg by mouth daily. Take daily for 21 days then do not take for 7 days.   glucose blood test strip Commonly known as:  ONETOUCH VERIO USE AS DIRECTED TO CHECK BLOOD SUGARS THREE TIMES DAILY Dx code E11.65   hydrochlorothiazide 25 MG tablet Commonly known as:  HYDRODIURIL Take 25 mg by mouth daily.   ibuprofen 600 MG tablet Commonly known as:  ADVIL,MOTRIN   Insulin Glargine 300 UNIT/ML Sopn Commonly known as:  TOUJEO SOLOSTAR Inject 70 Units into the skin daily.   insulin lispro 100 UNIT/ML KiwkPen Commonly known as:  HUMALOG KWIKPEN Inject 15 units at breakfast 15 at lunch and 20 units at supper.   meloxicam 7.5 MG tablet Commonly known as:  MOBIC Take 7.5 mg by mouth daily.   montelukast 10 MG tablet Commonly known as:  SINGULAIR Take 10 mg by mouth at bedtime.   multivitamin with minerals Tabs tablet Take 1 tablet by mouth daily.   olmesartan 20 MG tablet Commonly known as:  BENICAR Take 20 mg by mouth daily.   pantoprazole 40 MG tablet Commonly known as:  PROTONIX Take 40 mg by mouth daily.   Phentermine-Topiramate 7.5-46 MG Cp24 Commonly known as:  QSYMIA Take 1 tablet by mouth daily.   rosuvastatin 20 MG tablet Commonly known as:  CRESTOR Take 20 mg by mouth daily.   simvastatin 20 MG tablet Commonly known as:  ZOCOR Reported on 07/01/2015   TRIBENZOR 40-10-25 MG  Tabs Generic drug:  Olmesartan-Amlodipine-HCTZ Take 1 tablet by mouth daily. Reported on 07/01/2015   valACYclovir 1000 MG tablet Commonly known as:  VALTREX   venlafaxine XR 150 MG 24 hr capsule Commonly known as:  EFFEXOR-XR Take 150 mg by mouth daily.   VICTOZA 18 MG/3ML Sopn Generic drug:  Liraglutide   Vitamin B-12 2500 MCG Subl Place under the tongue.  VITAMIN D-3 PO Take by mouth.       Allergies:  Allergies  Allergen Reactions  . Invokana [Canagliflozin]     Constant yeast infections    Past Medical History:  Diagnosis Date  . Anxiety   . Diabetes mellitus   . Hypercholesteremia   . Hypertension     Past Surgical History:  Procedure Laterality Date  . ABDOMINAL HYSTERECTOMY    . BRAIN MENINGIOMA EXCISION  2005  . CESAREAN SECTION    . PITUITARY SURGERY    . THYROID SURGERY      Family History  Problem Relation Age of Onset  . Diabetes Mother   . Hyperlipidemia Mother   . Hypertension Mother     Social History:  reports that she has never smoked. She has never used smokeless tobacco. She reports that she drinks about 1.8 oz of alcohol per week . She reports that she does not use drugs.  Review of Systems:  Hypertension:   is taking Benicar HCT/amlodipine  with usually good control   She is planning to get bariatric surgery in [place] next year  Lipids: Taking  with adequate control of LDL, normal triglycerides  Lab Results  Component Value Date   CHOL 154 06/28/2015   HDL 50.30 06/28/2015   LDLCALC 85 06/28/2015   TRIG 93.0 06/28/2015   CHOLHDL 3 06/28/2015    No recent symptoms of neuropathy Diabetic shoe prescription has been given previously   Examination:   BP 122/80   Pulse 88   Ht 6' (1.829 m)   Wt 260 lb (117.9 kg)   SpO2 98%   BMI 35.26 kg/m   Body mass index is 35.26 kg/m.   Repeat pulse 76  ASSESSMENT/ PLAN:    Diabetes type 2 with obesity See history of present illness for detailed discussion of current  management, blood sugar patterns and problems identified Her blood sugars are Overall improved with adding Qsymia for controlling her poor diet This appears to be working safely with no change in blood pressure, did have mild increase in pulse initially on exam today  She has been able to reduce her basal insulin 15 units However still has not been able to reduce her mealtime dose Weight is only slightly better Although fructosamine is slightly higher at his representing much better control than compared to when her A1c was 9.3  Recommendations today: She will try taking 15 mg Qsymia daily instead of 7.5 No change in Toujeo as yet Regular exercise More blood sugars at different times especially after meals, discussed blood sugar targets after meals Improve diet and reduce  snacking in the evenings She wants to try Trulicity instead of Victoza and we can try 1.5 mg weekly if this is covered Recheck A1c on the next visit  Consider consultation with dietitian   Counseling time on subjects discussed above is over 50% of today's 25 minute visit   There are no Patient Instructions on file for this visit. Destiny Beasley 12/01/2015, 8:32 AM

## 2015-12-28 ENCOUNTER — Other Ambulatory Visit: Payer: Self-pay | Admitting: *Deleted

## 2016-01-03 ENCOUNTER — Telehealth: Payer: Self-pay | Admitting: Endocrinology

## 2016-01-03 NOTE — Telephone Encounter (Signed)
Pt calling to let us know of PA needed for Toujeo to [facility]

## 2016-01-03 NOTE — Telephone Encounter (Signed)
A P.A has been sent through Cover my meds.

## 2016-01-05 ENCOUNTER — Other Ambulatory Visit: Payer: Self-pay | Admitting: *Deleted

## 2016-01-05 ENCOUNTER — Telehealth: Payer: Self-pay | Admitting: Endocrinology

## 2016-01-05 NOTE — Telephone Encounter (Signed)
Patients insurance prefers either [employer] or [place] over [employer].  Please advise if you want to switch to one of these or if you want the P.A done?

## 2016-01-05 NOTE — Telephone Encounter (Signed)
Change to [place], same dose as Toujeo for now

## 2016-01-05 NOTE — Telephone Encounter (Signed)
Patient wanted a P.A done for toujeo since she's been on it for 3 years, I called her insurance and they approved toujeo for another year, message left for patient informing her of this.

## 2016-01-05 NOTE — Telephone Encounter (Signed)
Patient stated that insurance [employer]havent received her PA for medication Toujeo, and she has been out of medication for 5 days. Phone # [phone]

## 2016-01-17 ENCOUNTER — Other Ambulatory Visit: Payer: Self-pay | Admitting: Obstetrics and Gynecology

## 2016-01-17 DIAGNOSIS — R928 Other abnormal and inconclusive findings on diagnostic imaging of breast: Secondary | ICD-10-CM

## 2016-01-19 ENCOUNTER — Other Ambulatory Visit: Payer: Self-pay

## 2016-01-24 ENCOUNTER — Other Ambulatory Visit: Payer: Self-pay

## 2016-02-04 ENCOUNTER — Other Ambulatory Visit (INDEPENDENT_AMBULATORY_CARE_PROVIDER_SITE_OTHER): Payer: Managed Care, Other (non HMO)

## 2016-02-04 DIAGNOSIS — Z794 Long term (current) use of insulin: Secondary | ICD-10-CM | POA: Diagnosis not present

## 2016-02-04 DIAGNOSIS — E1165 Type 2 diabetes mellitus with hyperglycemia: Secondary | ICD-10-CM | POA: Diagnosis not present

## 2016-02-04 LAB — MICROALBUMIN / CREATININE URINE RATIO
Creatinine,U: 273 mg/dL
MICROALB UR: 0.8 mg/dL (ref 0.0–1.9)
Microalb Creat Ratio: 0.3 mg/g (ref 0.0–30.0)

## 2016-02-04 LAB — BASIC METABOLIC PANEL
BUN: 16 mg/dL (ref 6–23)
CHLORIDE: 104 meq/L (ref 96–112)
CO2: 33 mEq/L — ABNORMAL HIGH (ref 19–32)
Calcium: 9.5 mg/dL (ref 8.4–10.5)
Creatinine, Ser: 1 mg/dL (ref 0.40–1.20)
GFR: 73.23 mL/min (ref >60.00)
Glucose, Bld: 86 mg/dL (ref 70–99)
POTASSIUM: 4.4 meq/L (ref 3.5–5.1)
Sodium: 143 mEq/L (ref 135–145)

## 2016-02-04 LAB — HEMOGLOBIN A1C: Hgb A1c MFr Bld: 8.3 % — ABNORMAL HIGH (ref 4.6–6.5)

## 2016-02-08 ENCOUNTER — Encounter: Payer: Self-pay | Admitting: Endocrinology

## 2016-02-08 ENCOUNTER — Ambulatory Visit (INDEPENDENT_AMBULATORY_CARE_PROVIDER_SITE_OTHER): Payer: BLUE CROSS/BLUE SHIELD | Admitting: Endocrinology

## 2016-02-08 VITALS — BP 146/88 | HR 90 | Temp 97.8°F | Resp 16 | Ht 72.0 in | Wt 259.4 lb

## 2016-02-08 DIAGNOSIS — Z23 Encounter for immunization: Secondary | ICD-10-CM

## 2016-02-08 DIAGNOSIS — I10 Essential (primary) hypertension: Secondary | ICD-10-CM

## 2016-02-08 DIAGNOSIS — E1165 Type 2 diabetes mellitus with hyperglycemia: Secondary | ICD-10-CM

## 2016-02-08 DIAGNOSIS — Z794 Long term (current) use of insulin: Secondary | ICD-10-CM

## 2016-02-08 MED ORDER — LORCASERIN HCL ER 20 MG PO TB24: 20.0000 mg | ORAL_TABLET | Freq: Every day | ORAL | 2 refills | Status: DC

## 2016-02-08 NOTE — Progress Notes (Signed)
Patient ID: Destiny Beasley, female   DOB: [birth year], 60 y.o.   MRN: [id number]   Reason for Appointment: Type II Diabetes follow-up   History of Present Illness    Date of diagnosis: 10/2008  Previous history: She had markedly increased blood sugars at diagnosis and was tried on oral hypoglycemic drugs and Victoza for about 9 months before starting an insulin.  Since 2011 she had been on basal bolus insulin regimen Previously had had difficulty controlling her diabetes mostly because of difficulty with compliance with various aspects of self-care.   Recent history:   Insulin regimen: Toujeo 45 units at 8 am . Apidra 20 ac breakfast and lunch  Oral hypoglycemic drugs:   none  Her A1c is still relatively higher at 8.3 on the previously 9.3        Current management, blood sugar patterns and problems:  She is not taking Qsymia since 9/17, previously had started this in 6/17 significant improvement in her portion and diet control along with weight loss and reducing insulin requirement.  Also she had requested to change from Victoza to Trulicity for 1 time per week dosage  She is again checking blood sugar very sporadically and mostly overnight and in the morning  FASTING blood sugars are consistently high although only 86 in the lab.  Has tendency to occasional hypoglycemia overnight possibly from taking extra insulin late at night  However she is generally not taking average for her evening meal because of fear of hypoglycemia  She is not able to watch her diet recently and as difficulty controlling  portions and snacks in the evening.    Also she is very erratic with her insulin regimen at meal times, taking no coverage at suppertime and appears to be getting tendency to periodic hypoglycemia from her lunchtime dose  FASTING blood sugars are high but glucose was only 86 when she came for her fasting labs probably from not overeating the night before  Again needs weight  loss  She thinks she did not benefit from Trulicity as much as Victoza with diet control.  However has been able to maintain her weight with starting exercise, she thinks she is also walking during the day at work  She is not taking any oral hypoglycemic drugs.   Glucose readings from download  Mean values apply above for all meters except median for One Touch  PRE-MEAL Fasting Lunch Dinner Bedtime Overall  Glucose range: 168-218  214  60-177  106-187    Mean/median:     176+/-55    OVERNIGHT range: 53-195  Exercise: 2/7 recently, at [facility] visit: Most recent: 04/2010 Weight control:  Wt Readings from Last 3 Encounters:  02/08/16 259 lb 6.4 oz (117.7 kg)  12/01/15 260 lb (117.9 kg)  10/01/15 265 lb (120.2 kg)         Diabetes labs:  Lab Results  Component Value Date   HGBA1C 8.3 (H) 02/04/2016   HGBA1C 9.3 (H) 09/28/2015   HGBA1C 8.2 (H) 06/28/2015   Lab Results  Component Value Date   MICROALBUR 0.8 02/04/2016   LDLCALC 85 06/28/2015   CREATININE 1.00 02/04/2016    OBESITY: Thinks she had difficulty with word finding when she was taking Topamax in the Qsymia     No visits with results within 1 Day(s) from this visit.  Latest known visit with results is:  Lab on 02/04/2016  Component Date Value Ref Range Status  . Hgb A1c  MFr Bld 02/04/2016 8.3* 4.6 - 6.5 % Final  . Sodium 02/04/2016 143  135 - 145 mEq/L Final  . Potassium 02/04/2016 4.4  3.5 - 5.1 mEq/L Final  . Chloride 02/04/2016 104  96 - 112 mEq/L Final  . CO2 02/04/2016 33* 19 - 32 mEq/L Final  . Glucose, Bld 02/04/2016 86  70 - 99 mg/dL Final  . BUN 02/04/2016 16  6 - 23 mg/dL Final  . Creatinine, Ser 02/04/2016 1.00  0.40 - 1.20 mg/dL Final  . Calcium 02/04/2016 9.5  8.4 - 10.5 mg/dL Final  . GFR 02/04/2016 73.23  >60.00 mL/min Final  . Microalb, Ur 02/04/2016 0.8  0.0 - 1.9 mg/dL Final  . Creatinine,U 02/04/2016 273.0  mg/dL Final  . Microalb Creat Ratio 02/04/2016 0.3  0.0 - 30.0  mg/g Final       Medication List       Accurate as of 02/08/16  8:59 AM. Always use your most recent med list.          amLODipine 10 MG tablet Commonly known as:  NORVASC Take 10 mg by mouth daily.   APIDRA SOLOSTAR 100 UNIT/ML Solostar Pen Generic drug:  Insulin Glulisine INEJCT 15 UNITS INTO THE SKIN IN THE MORNING, 15 UNITS AT LUNCH AND 20 UNITS AT DINNER   B-D UF III MINI PEN NEEDLES 31G X 5 MM Misc Generic drug:  Insulin Pen Needle USE 4 DAILY   Biotin 10000 MCG Tabs Take by mouth.   Dulaglutide 1.5 MG/0.5ML Sopn Commonly known as:  TRULICITY Inject 1.5 mg into the skin once a week.   DULERA 100-5 MCG/ACT Aero Generic drug:  mometasone-formoterol   estrogens (conjugated) 0.625 MG tablet Commonly known as:  PREMARIN Take 0.625 mg by mouth daily. Take daily for 21 days then do not take for 7 days.   glucose blood test strip Commonly known as:  ONETOUCH VERIO USE AS DIRECTED TO CHECK BLOOD SUGARS THREE TIMES DAILY Dx code E11.65   hydrochlorothiazide 25 MG tablet Commonly known as:  HYDRODIURIL Take 25 mg by mouth daily.   ibuprofen 600 MG tablet Commonly known as:  ADVIL,MOTRIN   Insulin Glargine 300 UNIT/ML Sopn Commonly known as:  TOUJEO SOLOSTAR Inject 70 Units into the skin daily.   insulin lispro 100 UNIT/ML KiwkPen Commonly known as:  HUMALOG KWIKPEN Inject 15 units at breakfast 15 at lunch and 20 units at supper.   Lorcaserin HCl ER 20 MG Tb24 Commonly known as:  BELVIQ XR Take 20 mg by mouth daily before breakfast.   meloxicam 7.5 MG tablet Commonly known as:  MOBIC Take 7.5 mg by mouth daily.   montelukast 10 MG tablet Commonly known as:  SINGULAIR Take 10 mg by mouth at bedtime.   multivitamin with minerals Tabs tablet Take 1 tablet by mouth daily.   olmesartan 20 MG tablet Commonly known as:  BENICAR Take 20 mg by mouth daily.   pantoprazole 40 MG tablet Commonly known as:  PROTONIX Take 40 mg by mouth daily.     rosuvastatin 20 MG tablet Commonly known as:  CRESTOR Take 20 mg by mouth daily.   valACYclovir 1000 MG tablet Commonly known as:  VALTREX   venlafaxine XR 150 MG 24 hr capsule Commonly known as:  EFFEXOR-XR Take 150 mg by mouth daily.   VICTOZA 18 MG/3ML Sopn Generic drug:  liraglutide   Vitamin B-12 2500 MCG Subl Place under the tongue.   VITAMIN D-3 PO Take by mouth.  Allergies:  Allergies  Allergen Reactions  . Invokana [Canagliflozin]     Constant yeast infections    Past Medical History:  Diagnosis Date  . Anxiety   . Diabetes mellitus   . Hypercholesteremia   . Hypertension     Past Surgical History:  Procedure Laterality Date  . ABDOMINAL HYSTERECTOMY    . BRAIN MENINGIOMA EXCISION  2005  . CESAREAN SECTION    . PITUITARY SURGERY    . THYROID SURGERY      Family History  Problem Relation Age of Onset  . Diabetes Mother   . Hyperlipidemia Mother   . Hypertension Mother     Social History:  reports that she has never smoked. She has never used smokeless tobacco. She reports that she drinks about 1.8 oz of alcohol per week . She reports that she does not use drugs.  Review of Systems:  Hypertension:   is taking Benicar HCT/amlodipine, Followed by PCP Did not take her medication today   Lipids: Taking  with adequate control of LDL, normal triglycerides  Lab Results  Component Value Date   CHOL 154 06/28/2015   HDL 50.30 06/28/2015   LDLCALC 85 06/28/2015   TRIG 93.0 06/28/2015   CHOLHDL 3 06/28/2015    No recent symptoms of neuropathy  Diabetic shoe prescription has been given previously   Examination:   BP (!) 146/88   Pulse 90   Temp 97.8 F (36.6 C)   Resp 16   Ht 6' (1.829 m)   Wt 259 lb 6.4 oz (117.7 kg)   SpO2 98%   BMI 35.18 kg/m   Body mass index is 35.18 kg/m.     ASSESSMENT/ PLAN:    Diabetes type 2 with obesity See history of present illness for detailed discussion of current management, blood sugar  patterns and problems identified  Her A1c is still relatively high at 8.3 Has not being able to watch her diet recently and difficulty controlling  portions and snacks in the evening.  Had done better with Qsymia which she did not tolerate Also she is very erratic with her insulin regimen at meal times, taking no coverage at suppertime and appears to be getting tendency to hypoglycemia from her lunchtime dose FASTING blood sugars are high but glucose was only 86 when she came for her fasting labs probably from not overeating the night before Again needs weight loss She thinks she did not benefit from Trulicity as much as Victoza with diet control  Recommendations today:   No change in Toujeo unless fasting blood sugars continue to stay high Change Trulicity back to Victoza, she will need to go up slowly on the dose until she gets up to 1.8 mg Saxenda is not covered so she will try taking Belviq cR 20 mg daily, co-pay card given, discussed possible side effects Continue exercise More consistent monitoring after supper rather than later at night Reduce lunchtime coverage to 15 units instead of the 20  HYPERTENSION: Needs to follow-up with PCP and be consistent with her medications  Counseling time on subjects discussed above is over 50% of today's 25 minute visit   Patient Instructions  At supper 10 units Apidra at least  Victoza start with 0.6  Check blood sugars on waking up  daily  Also check blood sugars about 2 hours after a meal and do this after different meals by rotation  Recommended blood sugar levels on waking up is 90-130 and about 2 hours after meal is  130-160  Please bring your blood sugar monitor to each visit, thank you   [facility] 02/08/2016, 8:59 AM

## 2016-02-08 NOTE — Patient Instructions (Signed)
At supper 10 units Apidra at least  Victoza start with 0.6  Check blood sugars on waking up  daily  Also check blood sugars about 2 hours after a meal and do this after different meals by rotation  Recommended blood sugar levels on waking up is 90-130 and about 2 hours after meal is 130-160  Please bring your blood sugar monitor to each visit, thank you

## 2016-02-09 ENCOUNTER — Other Ambulatory Visit: Payer: Self-pay | Admitting: Endocrinology

## 2016-02-16 ENCOUNTER — Other Ambulatory Visit: Payer: Self-pay | Admitting: Endocrinology

## 2016-02-18 ENCOUNTER — Ambulatory Visit
Admission: RE | Admit: 2016-02-18 | Discharge: 2016-02-18 | Disposition: A | Discharge status: Home / Self Care | Payer: Managed Care, Other (non HMO) | Source: Ambulatory Visit | Attending: Obstetrics and Gynecology | Admitting: Obstetrics and Gynecology

## 2016-02-18 ENCOUNTER — Other Ambulatory Visit: Payer: Self-pay | Admitting: Obstetrics and Gynecology

## 2016-02-18 DIAGNOSIS — R928 Other abnormal and inconclusive findings on diagnostic imaging of breast: Secondary | ICD-10-CM

## 2016-02-21 ENCOUNTER — Ambulatory Visit
Admission: RE | Admit: 2016-02-21 | Discharge: 2016-02-21 | Disposition: A | Discharge status: Home / Self Care | Payer: Managed Care, Other (non HMO) | Source: Ambulatory Visit | Attending: Obstetrics and Gynecology | Admitting: Obstetrics and Gynecology

## 2016-02-21 DIAGNOSIS — R928 Other abnormal and inconclusive findings on diagnostic imaging of breast: Secondary | ICD-10-CM

## 2016-02-24 ENCOUNTER — Telehealth: Payer: Self-pay | Admitting: *Deleted

## 2016-02-24 ENCOUNTER — Encounter: Payer: Self-pay | Admitting: *Deleted

## 2016-02-24 DIAGNOSIS — C50411 Malignant neoplasm of upper-outer quadrant of right female breast: Secondary | ICD-10-CM | POA: Insufficient documentation

## 2016-02-24 HISTORY — DX: Malignant neoplasm of upper-outer quadrant of right female breast: C50.411

## 2016-02-24 NOTE — Telephone Encounter (Signed)
Mailed [facility] packet to pt. 

## 2016-02-24 NOTE — Telephone Encounter (Signed)
Confirmed [facility] for 03/01/16 at 0815 .  Instructions and contact information given.

## 2016-02-25 ENCOUNTER — Other Ambulatory Visit: Payer: Self-pay | Admitting: Neurosurgery

## 2016-02-25 DIAGNOSIS — D320 Benign neoplasm of cerebral meninges: Secondary | ICD-10-CM

## 2016-02-28 ENCOUNTER — Telehealth: Payer: Self-pay | Admitting: *Deleted

## 2016-02-28 ENCOUNTER — Encounter: Payer: Self-pay | Admitting: General Surgery

## 2016-02-28 NOTE — Telephone Encounter (Signed)
Pt called to inform she would like to choose her own team. Informed pt that I would cancel her [facility] appt for 11/8 and that I would inform [facility] of her request. Denies further needs or questions. Received verbal understanding.

## 2016-03-01 ENCOUNTER — Ambulatory Visit: Payer: Managed Care, Other (non HMO) | Admitting: Radiation Oncology

## 2016-03-01 ENCOUNTER — Ambulatory Visit: Payer: Self-pay | Admitting: Hematology

## 2016-03-01 ENCOUNTER — Ambulatory Visit: Payer: Managed Care, Other (non HMO) | Admitting: Physical Therapy

## 2016-03-01 ENCOUNTER — Other Ambulatory Visit: Payer: Self-pay

## 2016-03-01 ENCOUNTER — Other Ambulatory Visit: Payer: Self-pay | Admitting: General Surgery

## 2016-03-01 DIAGNOSIS — C50411 Malignant neoplasm of upper-outer quadrant of right female breast: Secondary | ICD-10-CM

## 2016-03-01 DIAGNOSIS — Z170 Estrogen receptor positive status [ER+]: Principal | ICD-10-CM

## 2016-03-03 ENCOUNTER — Encounter: Payer: Self-pay | Admitting: Hematology and Oncology

## 2016-03-03 ENCOUNTER — Telehealth: Payer: Self-pay | Admitting: Hematology and Oncology

## 2016-03-03 NOTE — Telephone Encounter (Signed)
Lft vm over the pt's phone to let her know that an appt has been scheduled for her to see Gudena on 11/22 at 345pm, following RadOnc appt.

## 2016-03-09 ENCOUNTER — Inpatient Hospital Stay: Admission: RE | Admit: 2016-03-09 | Payer: Self-pay | Source: Ambulatory Visit

## 2016-03-13 ENCOUNTER — Other Ambulatory Visit: Payer: Self-pay | Admitting: Endocrinology

## 2016-03-13 NOTE — Progress Notes (Signed)
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  Receptor Status: ER(80%), PR (20%)  Did patient present with symptoms or was this found on screening mammography?: It was found on a screening mammogram.   Past/Anticipated interventions by surgeon, if any: Dr. Dalbert Batman. Lumpectomy planned for 03/28/16  Past/Anticipated interventions by medical oncology, if any:  Dr. Lindi Adie 03/15/16  Lymphedema issues, if any:  N/A  Pain issues, if any: She denies   SAFETY ISSUES:  Prior radiation? 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 [facility])   Pacemaker/ICD? No  Possible current pregnancy? No  Is the patient on methotrexate? No  Current Complaints / other details:    BP (!) 171/85   Pulse 88 Comment: room air  Temp 98.5 F (36.9 C)   Ht 6' (1.829 m)   Wt 253 lb 12.8 oz (115.1 kg)   SpO2 100% Comment: room air  BMI 34.42 kg/m    Wt Readings from Last 3 Encounters:  03/15/16 253 lb 12.8 oz (115.1 kg)  02/08/16 259 lb 6.4 oz (117.7 kg)  12/01/15 260 lb (117.9 kg)      Marcella Charlson, Stephani Police, RN 03/13/2016,9:08 AM

## 2016-03-13 NOTE — Assessment & Plan Note (Signed)
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 It is considered a precancerous lesion. DCIS is classified as a 0. It is generally detected through mammograms as calcifications. We discussed the significance of grades and its impact on prognosis. We also discussed the importance of ER and PR receptors and their implications to adjuvant treatment options. Prognosis of DCIS dependence on grade, comedo necrosis. It is anticipated that if not treated, 20-30% of DCIS can develop into invasive breast cancer.  Recommendation: 1. Breast conserving surgery 2. Followed by adjuvant radiation therapy 3. Followed by antiestrogen therapy with tamoxifen 5 years  Tamoxifen counseling: We discussed the risks and benefits of tamoxifen. These include but not limited to insomnia, hot flashes, mood changes, vaginal dryness, and weight gain. Although rare, serious side effects including endometrial cancer, risk of blood clots were also discussed. We strongly believe that the benefits far outweigh the risks. Patient understands these risks and consented to starting treatment. Planned treatment duration is 5 years.  Return to clinic after surgery to discuss the final pathology report and come up with an adjuvant treatment plan.

## 2016-03-14 ENCOUNTER — Other Ambulatory Visit: Payer: Self-pay | Admitting: General Surgery

## 2016-03-14 DIAGNOSIS — Z170 Estrogen receptor positive status [ER+]: Principal | ICD-10-CM

## 2016-03-14 DIAGNOSIS — C50411 Malignant neoplasm of upper-outer quadrant of right female breast: Secondary | ICD-10-CM

## 2016-03-15 ENCOUNTER — Ambulatory Visit
Admission: RE | Admit: 2016-03-15 | Discharge: 2016-03-15 | Disposition: A | Discharge status: Home / Self Care | Payer: Managed Care, Other (non HMO) | Source: Ambulatory Visit | Attending: Radiation Oncology | Admitting: Radiation Oncology

## 2016-03-15 ENCOUNTER — Ambulatory Visit (HOSPITAL_BASED_OUTPATIENT_CLINIC_OR_DEPARTMENT_OTHER): Payer: Managed Care, Other (non HMO) | Admitting: Hematology and Oncology

## 2016-03-15 ENCOUNTER — Encounter: Payer: Self-pay | Admitting: Radiation Oncology

## 2016-03-15 ENCOUNTER — Encounter: Payer: Self-pay | Admitting: Hematology and Oncology

## 2016-03-15 DIAGNOSIS — Z888 Allergy status to other drugs, medicaments and biological substances status: Secondary | ICD-10-CM | POA: Diagnosis not present

## 2016-03-15 DIAGNOSIS — Z510 Encounter for antineoplastic radiation therapy: Secondary | ICD-10-CM | POA: Insufficient documentation

## 2016-03-15 DIAGNOSIS — E119 Type 2 diabetes mellitus without complications: Secondary | ICD-10-CM | POA: Diagnosis not present

## 2016-03-15 DIAGNOSIS — R200 Anesthesia of skin: Secondary | ICD-10-CM | POA: Insufficient documentation

## 2016-03-15 DIAGNOSIS — F419 Anxiety disorder, unspecified: Secondary | ICD-10-CM | POA: Diagnosis not present

## 2016-03-15 DIAGNOSIS — G4730 Sleep apnea, unspecified: Secondary | ICD-10-CM | POA: Insufficient documentation

## 2016-03-15 DIAGNOSIS — Z79899 Other long term (current) drug therapy: Secondary | ICD-10-CM | POA: Insufficient documentation

## 2016-03-15 DIAGNOSIS — Z170 Estrogen receptor positive status [ER+]: Secondary | ICD-10-CM | POA: Insufficient documentation

## 2016-03-15 DIAGNOSIS — I10 Essential (primary) hypertension: Secondary | ICD-10-CM | POA: Diagnosis not present

## 2016-03-15 DIAGNOSIS — Z923 Personal history of irradiation: Secondary | ICD-10-CM | POA: Insufficient documentation

## 2016-03-15 DIAGNOSIS — D0512 Intraductal carcinoma in situ of left breast: Secondary | ICD-10-CM | POA: Diagnosis not present

## 2016-03-15 DIAGNOSIS — Z8249 Family history of ischemic heart disease and other diseases of the circulatory system: Secondary | ICD-10-CM | POA: Diagnosis not present

## 2016-03-15 DIAGNOSIS — Z833 Family history of diabetes mellitus: Secondary | ICD-10-CM | POA: Insufficient documentation

## 2016-03-15 DIAGNOSIS — Z794 Long term (current) use of insulin: Secondary | ICD-10-CM | POA: Diagnosis not present

## 2016-03-15 DIAGNOSIS — D0511 Intraductal carcinoma in situ of right breast: Secondary | ICD-10-CM | POA: Diagnosis not present

## 2016-03-15 DIAGNOSIS — E7800 Pure hypercholesterolemia, unspecified: Secondary | ICD-10-CM | POA: Diagnosis not present

## 2016-03-15 DIAGNOSIS — C50411 Malignant neoplasm of upper-outer quadrant of right female breast: Secondary | ICD-10-CM

## 2016-03-15 HISTORY — DX: Sleep apnea, unspecified: G47.30

## 2016-03-15 NOTE — Progress Notes (Signed)
[place] NOTE  Patient Care Team: Lawerance Cruel, MD as PCP - General (Family Medicine) Fanny Skates, MD as Consulting Physician (General Surgery) Truitt Merle, MD as Consulting Physician (Hematology) Eppie Gibson, MD as Attending Physician (Radiation Oncology)  CHIEF COMPLAINTS/PURPOSE OF CONSULTATION:  Newly diagnosed left breast DCIS  HISTORY OF PRESENTING ILLNESS:  Destiny Beasley 60 y.o. female is here because of recent diagnosis of left breast DCIS. Patient had a routine screening mammogram that revealed abnormalities in the left breast measuring 9 mm in size. She had ultrasound and a biopsy which revealed high-grade DCIS with apocrine features arising in intraductal papilloma. It was ER/PR positive. She saw Dr. Dalbert Batman who recommended lumpectomy. She is scheduled to undergo lumpectomy on 03/28/2016. She is here today to discuss adjuvant treatment plan. She is here today accompanied by her friend.  I reviewed her records extensively and collaborated the history with the patient.  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  She was never exposed to fertility medications or hormone replacement therapy.  She has no family history of Breast/GYN/GI cancer  MEDICAL HISTORY:  Past Medical History:  Diagnosis Date  . Anxiety   . Diabetes mellitus   . Hypercholesteremia   . Hypertension   . Malignant neoplasm of upper-outer quadrant of right female breast ([place]) 02/24/2016  . Sleep apnea     SURGICAL HISTORY: Past  Surgical History:  Procedure Laterality Date  . ABDOMINAL HYSTERECTOMY    . BRAIN MENINGIOMA EXCISION  2005  . CESAREAN SECTION    . FOOT SURGERY Bilateral 2016   hammer toe surgery  . MENISCUS REPAIR Left 2015  . PITUITARY SURGERY    . THYROID SURGERY      SOCIAL HISTORY: Social History   Social History  . Marital status: Single    Spouse name: N/A  . Number of children: N/A  . Years of education: N/A   Occupational History  . Not on file.   Social History Main Topics  . Smoking status: Never Smoker  . Smokeless tobacco: Never Used  . Alcohol use 0.6 oz/week    1 Glasses of wine per week     Comment: 1 glass monthly  . Drug use: No  . Sexual activity: Yes    Birth control/ protection: Other-see comments     Comment: no cycles; pt had hyst   Other Topics Concern  . Not on file   Social History Narrative  . No narrative on file    FAMILY HISTORY: Family History  Problem Relation Age of Onset  . Diabetes Mother   . Hyperlipidemia Mother   . Hypertension Mother     ALLERGIES:  is allergic to invokana [canagliflozin].  MEDICATIONS:  Current Outpatient Prescriptions  Medication Sig Dispense Refill  . amLODipine (NORVASC) 10 MG tablet Take 10 mg by mouth daily.    . APIDRA SOLOSTAR 100 UNIT/ML Solostar Pen INEJCT 15 UNITS INTO THE SKIN IN THE MORNING, 15 UNITS  AT LUNCH AND 20 UNITS AT DINNER 15 pen 3  . B-D UF III MINI PEN NEEDLES 31G X 5 MM MISC USE 4 DAILY 200 each 3  . Biotin 10000 MCG TABS Take by mouth.    . Cholecalciferol (VITAMIN D-3 PO) Take by mouth.    . Cyanocobalamin (VITAMIN B-12) 2500 MCG SUBL Place under the tongue.    . DULERA 100-5 MCG/ACT AERO     . estrogens, conjugated, (PREMARIN) 0.625 MG tablet Take 0.625 mg by mouth daily. Take daily for 21 days then do not take for 7 days.    Marland Kitchen glucose blood (ONETOUCH VERIO) test strip USE AS DIRECTED TO CHECK BLOOD SUGARS THREE TIMES DAILY Dx code E11.65 150 each 3  . hydrochlorothiazide  (HYDRODIURIL) 25 MG tablet Take 25 mg by mouth daily.    Marland Kitchen ibuprofen (ADVIL,MOTRIN) 600 MG tablet     . Insulin Glargine (TOUJEO SOLOSTAR) 300 UNIT/ML SOPN Inject 70 Units into the skin daily. (Patient taking differently: Inject 45 Units into the skin daily. ) 8 pen 0  . Liraglutide (VICTOZA) 18 MG/3ML SOPN     . meloxicam (MOBIC) 7.5 MG tablet Take 7.5 mg by mouth daily.    . montelukast (SINGULAIR) 10 MG tablet Take 10 mg by mouth at bedtime.    . Multiple Vitamin (MULITIVITAMIN WITH MINERALS) TABS Take 1 tablet by mouth daily.      Marland Kitchen olmesartan (BENICAR) 20 MG tablet Take 20 mg by mouth daily.    . pantoprazole (PROTONIX) 40 MG tablet Take 40 mg by mouth daily.     . rosuvastatin (CRESTOR) 20 MG tablet Take 20 mg by mouth daily.    . valACYclovir (VALTREX) 1000 MG tablet     . venlafaxine (EFFEXOR-XR) 150 MG 24 hr capsule Take 150 mg by mouth daily.       No current facility-administered medications for this visit.     REVIEW OF SYSTEMS:   Constitutional: Denies fevers, chills or abnormal night sweats Eyes: Denies blurriness of vision, double vision or watery eyes Ears, nose, mouth, throat, and face: Denies mucositis or sore throat Respiratory: Denies cough, dyspnea or wheezes Cardiovascular: Denies palpitation, chest discomfort or lower extremity swelling Gastrointestinal:  Denies nausea, heartburn or change in bowel habits Skin: Denies abnormal skin rashes Lymphatics: Denies new lymphadenopathy or easy bruising Neurological:Denies numbness, tingling or new weaknesses Behavioral/Psych: Mood is stable, no new changes  Breast:  Denies any palpable lumps or discharge All other systems were reviewed with the patient and are negative.  PHYSICAL EXAMINATION: ECOG PERFORMANCE STATUS: 0 - Asymptomatic  Vitals:   03/15/16 1526  BP: (!) 150/76  Pulse: 96  Resp: 18  Temp: 98.5 F (36.9 C)   Filed Weights   03/15/16 1526  Weight: 253 lb (114.8 kg)    GENERAL:alert, no distress  and comfortable SKIN: skin color, texture, turgor are normal, no rashes or significant lesions EYES: normal, conjunctiva are pink and non-injected, sclera clear OROPHARYNX:no exudate, no erythema and lips, buccal mucosa, and tongue normal  NECK: supple, thyroid normal size, non-tender, without nodularity LYMPH:  no palpable lymphadenopathy in the cervical, axillary or inguinal LUNGS: clear to auscultation and percussion with normal breathing effort HEART: regular rate & rhythm and no murmurs and no lower extremity edema ABDOMEN:abdomen soft, non-tender and normal bowel sounds Musculoskeletal:no cyanosis of digits and no clubbing  PSYCH: alert & oriented x 3 with fluent speech NEURO: no focal motor/sensory deficits BREAST: No palpable nodules in breast. No palpable axillary  or supraclavicular lymphadenopathy (exam performed in the presence of a chaperone)   LABORATORY DATA:  I have reviewed the data as listed Lab Results  Component Value Date   WBC 8.9 04/22/2013   HGB 13.9 04/22/2013   HCT 40.7 04/22/2013   MCV 88.7 04/22/2013   PLT 344 04/22/2013   Lab Results  Component Value Date   NA 143 02/04/2016   K 4.4 02/04/2016   CL 104 02/04/2016   CO2 33 (H) 02/04/2016    RADIOGRAPHIC STUDIES: I have personally reviewed the radiological reports and agreed with the findings in the report.  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 It is considered a precancerous lesion. DCIS is classified as a 0. It is generally detected through mammograms as calcifications. We discussed the significance of grades and its impact on prognosis. We also discussed the importance of ER and PR receptors and their implications to  adjuvant treatment options. Prognosis of DCIS dependence on grade, comedo necrosis. It is anticipated that if not treated, 20-30% of DCIS can develop into invasive breast cancer.  Recommendation: 1. Breast conserving surgery 2. Followed by adjuvant radiation therapy 3. Followed by antiestrogen therapy with tamoxifen 5 years  Tamoxifen counseling: We discussed the risks and benefits of tamoxifen. These include but not limited to insomnia, hot flashes, mood changes, vaginal dryness, and weight gain. Although rare, serious side effects including endometrial cancer, risk of blood clots were also discussed. We strongly believe that the benefits far outweigh the risks. Patient understands these risks and consented to starting treatment. Planned treatment duration is 5 years.  [facility] Nomogram: With lumpectomy radiation and tamoxifen her risk of recurrence was 3% or 5 years and 4% over 10 years. If she did not do radiation her risk of recurrence was 7% over 5 years 11% over 10 years. With her antiestrogen therapy risk of recurrence of 6% or 5 years and 9% over 10 years. If she did not do anything after surgery her risk of recurrence is 14% or 5 years and 22% over 10 years. Patient looked at this data and was contemplating whether she could skip doing antiestrogen therapy.  Return to clinic after surgery to discuss the final pathology report and come up with an adjuvant treatment plan.  All questions were answered. The patient knows to call the clinic with any problems, questions or concerns.    Rulon Eisenmenger, MD 03/15/16

## 2016-03-15 NOTE — Progress Notes (Addendum)
Radiation Oncology         [phone] ________________________________  Initial Outpatient Consultation  Name: Destiny Beasley MRN: [id number]  Date: 03/15/2016  DOB: 1955/07/23  WU:[id number] Harrington Challenger, MD  Fanny Skates, MD   REFERRING PHYSICIAN: Fanny Skates, MD  DIAGNOSIS:    ICD-9-CM ICD-10-CM   1. Malignant neoplasm of upper-outer quadrant of right breast in female, estrogen receptor positive ([place]) 174.4 C50.411    V86.0 Z17.0    Stage 0 (TisN0M0) Right Breast, UOQ, Ductal Carcinoma in Situ, ER80% / PR20%, High Grade  CHIEF COMPLAINT: Here to discuss management of right breast DCIS  HISTORY OF PRESENT ILLNESS::Destiny Beasley is a 60 y.o. female who had a screening mammogram in October that noted a right breast mass.  Diagnostic mammogram on 02/18/16 showed a 0.9-1.0 cm mass with slightly irregular margins in the UOQ of the right breast. Physical exam at that time did not reveal any palpable masses. [place] at the time revealed a 0.9 x 0.7 x 0.7 cm irregular hypoechoic mass at the 9:30 position 2 cm from the nipple. No lymphadenopathy was noted in the right axilla.  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  The patient presents with her friend. She works in [employer]. Nonsmoker.  Denies pain, SOB, cough, vaginal bleeding (not menstruating), urinary problems, bowel issues, bone pain, neck masses, diarrhea/constipation, rashes, difficulty with coordination, dizziness, or anxiety/depression. She reports numbness in her bilateral upper extremities about a week ago since biopsy.    PREVIOUS RADIATION THERAPY: Yes. 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  PAST MEDICAL HISTORY:  has a past medical history of Anxiety; Diabetes mellitus; Hypercholesteremia; Hypertension; Malignant neoplasm of upper-outer quadrant of right female breast  ([place]) (02/24/2016); and Sleep apnea.    PAST SURGICAL HISTORY: Past Surgical History:  Procedure Laterality Date  . ABDOMINAL HYSTERECTOMY    . BRAIN MENINGIOMA EXCISION  2005  . CESAREAN SECTION    . FOOT SURGERY Bilateral 2016   hammer toe surgery  . MENISCUS REPAIR Left 2015  . PITUITARY SURGERY    . THYROID SURGERY      FAMILY HISTORY: family history includes Diabetes in her mother; Hyperlipidemia in her mother; Hypertension in her mother.  SOCIAL HISTORY:  reports that she has never smoked. She has never used smokeless tobacco. She reports that she drinks about 0.6 oz of alcohol per week . She reports that she does not use drugs.  ALLERGIES: Invokana [canagliflozin]  MEDICATIONS:  Current Outpatient Prescriptions  Medication Sig Dispense Refill  . amLODipine (NORVASC) 10 MG tablet Take 10 mg by mouth daily.    . APIDRA SOLOSTAR 100 UNIT/ML Solostar Pen INEJCT 15 UNITS INTO THE SKIN IN THE MORNING, 15 UNITS AT LUNCH AND 20 UNITS AT DINNER 15 pen 3  . B-D UF III MINI PEN NEEDLES 31G X 5 MM MISC USE 4 DAILY 200 each 3  . Biotin 10000 MCG TABS Take by mouth.    . Cholecalciferol (VITAMIN D-3 PO) Take by mouth.    . Cyanocobalamin (VITAMIN B-12) 2500 MCG SUBL Place under the tongue.    . DULERA 100-5 MCG/ACT AERO     . glucose blood (ONETOUCH VERIO) test strip USE AS DIRECTED TO CHECK BLOOD SUGARS THREE TIMES DAILY Dx code E11.65 150 each 3  . hydrochlorothiazide (HYDRODIURIL) 25 MG tablet Take 25 mg by mouth daily.    Marland Kitchen  ibuprofen (ADVIL,MOTRIN) 600 MG tablet     . Insulin Glargine (TOUJEO SOLOSTAR) 300 UNIT/ML SOPN Inject 70 Units into the skin daily. (Patient taking differently: Inject 45 Units into the skin daily. ) 8 pen 0  . Liraglutide (VICTOZA) 18 MG/3ML SOPN     . meloxicam (MOBIC) 7.5 MG tablet Take 7.5 mg by mouth daily.    . montelukast (SINGULAIR) 10 MG tablet Take 10 mg by mouth at bedtime.    . Multiple Vitamin (MULITIVITAMIN WITH MINERALS) TABS Take 1 tablet by  mouth daily.      Marland Kitchen olmesartan (BENICAR) 20 MG tablet Take 20 mg by mouth daily.    . pantoprazole (PROTONIX) 40 MG tablet Take 40 mg by mouth daily.     . rosuvastatin (CRESTOR) 20 MG tablet Take 20 mg by mouth daily.    . valACYclovir (VALTREX) 1000 MG tablet     . venlafaxine (EFFEXOR-XR) 150 MG 24 hr capsule Take 150 mg by mouth daily.      Marland Kitchen estrogens, conjugated, (PREMARIN) 0.625 MG tablet Take 0.625 mg by mouth daily. Take daily for 21 days then do not take for 7 days.     No current facility-administered medications for this encounter.     REVIEW OF SYSTEMS:  AT LEAST A 10 POINT REVIEW OF SYSTEMS (neuro, skin, HEENT, cardiac, respiratory, MSK, psychiatric, consitutional, GYN, breast, GU, GI)  WAS OBTAINED.  All pertinent positives are noted in the HPI.  All others are negative.     PHYSICAL EXAM:  height is 6' (1.829 m) and weight is 253 lb 12.8 oz (115.1 kg). Her temperature is 98.5 F (36.9 C). Her blood pressure is 171/85 (abnormal) and her pulse is 88. Her oxygen saturation is 100%.   General: Alert and oriented, in no acute distress HEENT: Head is normocephalic. Extraocular movements are intact. Oropharynx is clear. Neck: Neck is supple, no palpable cervical or supraclavicular lymphadenopathy. Heart: Regular in rate and rhythm with no murmurs, rubs, or gallops. Chest: Clear to auscultation bilaterally, with no rhonchi, wheezes, or rales. Abdomen: Soft, nontender, nondistended, with no rigidity or guarding. Extremities: No cyanosis or edema. Lymphatics: see Neck Exam Skin: No concerning lesions. Musculoskeletal: symmetric strength and muscle tone throughout. Neurologic: Cranial nerves II through XII are grossly intact. No obvious focalities. Speech is fluent. Coordination is intact. Psychiatric: Judgment and insight are intact. Affect is appropriate. Breasts: No other palpable masses appreciated in the breasts or axillae. She has large breasts.  ECOG = 0  0 - Asymptomatic  (Fully active, able to carry on all predisease activities without restriction)  1 - Symptomatic but completely ambulatory (Restricted in physically strenuous activity but ambulatory and able to carry out work of a light or sedentary nature. For example, light housework, office work)  2 - Symptomatic, <50% in bed during the day (Ambulatory and capable of all self care but unable to carry out any work activities. Up and about more than 50% of waking hours)  3 - Symptomatic, >50% in bed, but not bedbound (Capable of only limited self-care, confined to bed or chair 50% or more of waking hours)  4 - Bedbound (Completely disabled. Cannot carry on any self-care. Totally confined to bed or chair)  5 - Death   Eustace Pen MM, Creech RH, Tormey DC, et al. [phone]). "Toxicity and response criteria of the [facility] Group". [place] Oncol. 5 (6): 649-55   LABORATORY DATA:  Lab Results  Component Value Date   WBC 8.9  04/22/2013   HGB 13.9 04/22/2013   HCT 40.7 04/22/2013   MCV 88.7 04/22/2013   PLT 344 04/22/2013   CMP     Component Value Date/Time   NA 143 02/04/2016 0821   K 4.4 02/04/2016 0821   CL 104 02/04/2016 0821   CO2 33 (H) 02/04/2016 0821   GLUCOSE 86 02/04/2016 0821   BUN 16 02/04/2016 0821   CREATININE 1.00 02/04/2016 0821   CALCIUM 9.5 02/04/2016 0821   PROT 7.8 09/28/2015 0837   ALBUMIN 4.3 09/28/2015 0837   AST 20 09/28/2015 0837   ALT 23 09/28/2015 0837   ALKPHOS 76 09/28/2015 0837   BILITOT 0.3 09/28/2015 0837   GFRNONAA 77 (L) 04/22/2013 2145   GFRAA 89 (L) 04/22/2013 2145         RADIOGRAPHY: US Breast Ltd Uni Right Inc Axilla  Result Date: 02/18/2016 CLINICAL DATA:  Screening recall for right breast mass. EXAM: 2D DIGITAL DIAGNOSTIC RIGHT MAMMOGRAM WITH ADJUNCT TOMO ULTRASOUND RIGHT BREAST COMPARISON:  Previous exam(s). ACR Breast Density Category b: There are scattered areas of fibroglandular density. FINDINGS: Cc and MLO tomograms were  performed of the right breast demonstrating a mass with slightly irregular margins in the slightly upper outer right breast measuring approximately 9-10 mm. Physical examination of the outer right breast does not reveal any palpable masses. Targeted ultrasound of the right breast was performed demonstrating an irregular hypoechoic mass at the 9:30 position 2 cm from nipple measuring 0.9 x 0.7 x 0.7 cm. This corresponds well with mammography findings. There is no lymphadenopathy seen in the right axilla. IMPRESSION: Suspicious right breast mass. RECOMMENDATION: Ultrasound-guided biopsy of the mass in the right breast is recommended. This is being scheduled for the patient. I have discussed the findings and recommendations with the patient. Results were also provided in writing at the conclusion of the visit. If applicable, a reminder letter will be sent to the patient regarding the next appointment. BI-RADS CATEGORY  4: Suspicious. Electronically Signed   By: Everlean Alstrom M.D.   On: 02/18/2016 12:27   Mm Diag Breast Tomo Uni Right  Result Date: 02/18/2016 CLINICAL DATA:  Screening recall for right breast mass. EXAM: 2D DIGITAL DIAGNOSTIC RIGHT MAMMOGRAM WITH ADJUNCT TOMO ULTRASOUND RIGHT BREAST COMPARISON:  Previous exam(s). ACR Breast Density Category b: There are scattered areas of fibroglandular density. FINDINGS: Cc and MLO tomograms were performed of the right breast demonstrating a mass with slightly irregular margins in the slightly upper outer right breast measuring approximately 9-10 mm. Physical examination of the outer right breast does not reveal any palpable masses. Targeted ultrasound of the right breast was performed demonstrating an irregular hypoechoic mass at the 9:30 position 2 cm from nipple measuring 0.9 x 0.7 x 0.7 cm. This corresponds well with mammography findings. There is no lymphadenopathy seen in the right axilla. IMPRESSION: Suspicious right breast mass. RECOMMENDATION:  Ultrasound-guided biopsy of the mass in the right breast is recommended. This is being scheduled for the patient. I have discussed the findings and recommendations with the patient. Results were also provided in writing at the conclusion of the visit. If applicable, a reminder letter will be sent to the patient regarding the next appointment. BI-RADS CATEGORY  4: Suspicious. Electronically Signed   By: Everlean Alstrom M.D.   On: 02/18/2016 12:27   Mm Clip Placement Right  Result Date: 02/21/2016 CLINICAL DATA:  Post ultrasound-guided core needle biopsy of right 930 o'clock breast mass. EXAM: DIAGNOSTIC RIGHT MAMMOGRAM POST ULTRASOUND BIOPSY COMPARISON:  Previous exam(s). FINDINGS: Mammographic images were obtained following ultrasound guided biopsy of right 930 o'clock breast mass. Two-view mammography demonstrates presence of ribbon shaped tissue marker within the biopsy site in the right 930 o'clock breast. Expected post biopsy changes are seen. IMPRESSION: Successful placement of post biopsy tissue marker in the right 930 o'clock breast. Final Assessment: Post Procedure Mammograms for Marker Placement Electronically Signed   By: Fidela Salisbury M.D.   On: 02/21/2016 08:30   [place] Rt Breast Bx W Loc Dev 1st Lesion Img Bx Spec US Guide  Addendum Date: 02/23/2016   ADDENDUM REPORT: 02/23/2016 10:34 ADDENDUM: Pathology revealed high grade ductal carcinoma in situ with apocrine features, partially involving an intraductal papilloma in the right breast. This was found to be concordant by Dr. Fidela Salisbury. Pathology results were discussed with the patient by telephone. The patient reported doing well after the biopsy with tenderness at the site. Post biopsy instructions and care were reviewed and questions were answered. The patient was encouraged to call [facility] for any additional concerns. The patient was referred to the [facility] at  the [facility] on March 01, 2016. Pathology results reported by Susa Raring RN, BSN on 02/23/2016. Electronically Signed   By: Fidela Salisbury M.D.   On: 02/23/2016 10:34   Result Date: 02/23/2016 CLINICAL DATA:  Right breast 930 o'clock mass. EXAM: ULTRASOUND GUIDED RIGHT BREAST CORE NEEDLE BIOPSY COMPARISON:  Previous exam(s). FINDINGS: I met with the patient and we discussed the procedure of ultrasound-guided biopsy, including benefits and alternatives. We discussed the high likelihood of a successful procedure. We discussed the risks of the procedure, including infection, bleeding, tissue injury, clip migration, and inadequate sampling. Informed written consent was given. The usual time-out protocol was performed immediately prior to the procedure. Using sterile technique and 1% Lidocaine as local anesthetic, under direct ultrasound visualization, a 14 gauge spring-loaded device was used to perform biopsy of right 930 o'clock breast mass using a inferior approach. At the conclusion of the procedure a ribbon shaped tissue marker clip was deployed into the biopsy cavity. Follow up 2 view mammogram was performed and dictated separately. IMPRESSION: Ultrasound guided biopsy of right breast 930 o'clock mass. No apparent complications. Electronically Signed: By: Fidela Salisbury M.D. On: 02/21/2016 08:23      IMPRESSION/PLAN:  DCIS right breast  It was a pleasure meeting the patient today. We discussed the risks, benefits, and side effects of radiotherapy. I recommend radiotherapy to the right breast to reduce her risk of locoregional recurrence by two-thirds.  We discussed that radiation would take approximately 6-7 weeks to complete and that I would give the patient a few weeks to heal following surgery before starting treatment planning. We spoke about acute effects including skin irritation and fatigue as well as much less common late effects including internal organ injury or  irritation. We spoke about the latest technology that is used to minimize the risk of late effects for patients undergoing radiotherapy to the breast or chest wall. No guarantees of treatment were given. The patient is enthusiastic about proceeding with treatment. I look forward to participating in the patient's care.  The patient is scheduled to consult with Dr. Lindi Adie later today and the patient is scheduled for a right lumpectomy on 03/28/16. She will return after surgery to further discuss radiotherapy and to inspect her surgical site.  She was instructed to let an MD know if her UE numbness persists  for more than a couple weeks. __________________________________________   Eppie Gibson, MD  This document serves as a record of services personally performed by Eppie Gibson, MD. It was created on her behalf by Darcus Austin, a trained medical scribe. The creation of this record is based on the scribe's personal observations and the provider's statements to them. This document has been checked and approved by the attending provider.

## 2016-03-22 ENCOUNTER — Encounter (HOSPITAL_BASED_OUTPATIENT_CLINIC_OR_DEPARTMENT_OTHER): Payer: Self-pay | Admitting: *Deleted

## 2016-03-22 ENCOUNTER — Encounter (HOSPITAL_BASED_OUTPATIENT_CLINIC_OR_DEPARTMENT_OTHER)
Admission: RE | Admit: 2016-03-22 | Discharge: 2016-03-22 | Disposition: A | Discharge status: Home / Self Care | Payer: Managed Care, Other (non HMO) | Source: Ambulatory Visit | Attending: General Surgery | Admitting: General Surgery

## 2016-03-22 DIAGNOSIS — Z90710 Acquired absence of both cervix and uterus: Secondary | ICD-10-CM | POA: Diagnosis not present

## 2016-03-22 DIAGNOSIS — Z7982 Long term (current) use of aspirin: Secondary | ICD-10-CM | POA: Diagnosis not present

## 2016-03-22 DIAGNOSIS — F419 Anxiety disorder, unspecified: Secondary | ICD-10-CM | POA: Diagnosis not present

## 2016-03-22 DIAGNOSIS — Z888 Allergy status to other drugs, medicaments and biological substances status: Secondary | ICD-10-CM | POA: Diagnosis not present

## 2016-03-22 DIAGNOSIS — I10 Essential (primary) hypertension: Secondary | ICD-10-CM | POA: Diagnosis not present

## 2016-03-22 DIAGNOSIS — E1151 Type 2 diabetes mellitus with diabetic peripheral angiopathy without gangrene: Secondary | ICD-10-CM | POA: Diagnosis not present

## 2016-03-22 DIAGNOSIS — I672 Cerebral atherosclerosis: Secondary | ICD-10-CM | POA: Diagnosis not present

## 2016-03-22 DIAGNOSIS — E1165 Type 2 diabetes mellitus with hyperglycemia: Secondary | ICD-10-CM | POA: Diagnosis not present

## 2016-03-22 DIAGNOSIS — E7800 Pure hypercholesterolemia, unspecified: Secondary | ICD-10-CM | POA: Diagnosis not present

## 2016-03-22 DIAGNOSIS — G459 Transient cerebral ischemic attack, unspecified: Secondary | ICD-10-CM | POA: Diagnosis present

## 2016-03-22 DIAGNOSIS — Z794 Long term (current) use of insulin: Secondary | ICD-10-CM | POA: Diagnosis not present

## 2016-03-22 DIAGNOSIS — R8271 Bacteriuria: Secondary | ICD-10-CM | POA: Diagnosis not present

## 2016-03-22 DIAGNOSIS — D0512 Intraductal carcinoma in situ of left breast: Secondary | ICD-10-CM | POA: Diagnosis not present

## 2016-03-22 DIAGNOSIS — C50411 Malignant neoplasm of upper-outer quadrant of right female breast: Secondary | ICD-10-CM | POA: Diagnosis not present

## 2016-03-22 DIAGNOSIS — Z8249 Family history of ischemic heart disease and other diseases of the circulatory system: Secondary | ICD-10-CM | POA: Diagnosis not present

## 2016-03-22 DIAGNOSIS — R0789 Other chest pain: Secondary | ICD-10-CM | POA: Diagnosis not present

## 2016-03-22 DIAGNOSIS — M419 Scoliosis, unspecified: Secondary | ICD-10-CM | POA: Diagnosis not present

## 2016-03-22 DIAGNOSIS — G4733 Obstructive sleep apnea (adult) (pediatric): Secondary | ICD-10-CM | POA: Diagnosis not present

## 2016-03-22 LAB — COMPREHENSIVE METABOLIC PANEL
ALBUMIN: 3.9 g/dL (ref 3.5–5.0)
ALK PHOS: 86 U/L (ref 38–126)
ALT: 38 U/L (ref 14–54)
AST: 26 U/L (ref 15–41)
Anion gap: 8 (ref 5–15)
BILIRUBIN TOTAL: 0.4 mg/dL (ref 0.3–1.2)
BUN: 14 mg/dL (ref 6–20)
CALCIUM: 9.5 mg/dL (ref 8.9–10.3)
CO2: 30 mmol/L (ref 22–32)
CREATININE: 1.03 mg/dL — AB (ref 0.44–1.00)
Chloride: 97 mmol/L — ABNORMAL LOW (ref 101–111)
GFR calc Af Amer: >60 mL/min (ref >60)
GFR calc non Af Amer: 59 mL/min — ABNORMAL LOW (ref >60)
GLUCOSE: 339 mg/dL — AB (ref 65–99)
Potassium: 4.1 mmol/L (ref 3.5–5.1)
SODIUM: 135 mmol/L (ref 135–145)
Total Protein: 7.4 g/dL (ref 6.5–8.1)

## 2016-03-22 LAB — CBC WITH DIFFERENTIAL/PLATELET
Basophils Absolute: 0 10*3/uL (ref 0.0–0.1)
Basophils Relative: 0 %
EOS ABS: 0.4 10*3/uL (ref 0.0–0.7)
Eosinophils Relative: 4 %
HEMATOCRIT: 40.8 % (ref 36.0–46.0)
HEMOGLOBIN: 13.6 g/dL (ref 12.0–15.0)
LYMPHS ABS: 3.7 10*3/uL (ref 0.7–4.0)
Lymphocytes Relative: 45 %
MCH: 29.5 pg (ref 26.0–34.0)
MCHC: 33.3 g/dL (ref 30.0–36.0)
MCV: 88.5 fL (ref 78.0–100.0)
Monocytes Absolute: 0.5 10*3/uL (ref 0.1–1.0)
Monocytes Relative: 6 %
NEUTROS ABS: 3.7 10*3/uL (ref 1.7–7.7)
NEUTROS PCT: 45 %
Platelets: 297 10*3/uL (ref 150–400)
RBC: 4.61 MIL/uL (ref 3.87–5.11)
RDW: 13.8 % (ref 11.5–15.5)
WBC: 8.3 10*3/uL (ref 4.0–10.5)

## 2016-03-22 NOTE — Progress Notes (Addendum)
Dr. Royce Macadamia reviewed EKG, ok to proceed with surgery as scheduled. Bottle water and crystal light packet given to pt with instructions to complete by 0745/ pt verbalized understanding.

## 2016-03-23 LAB — HEMOGLOBIN A1C
HEMOGLOBIN A1C: 9.1 % — AB (ref 4.8–5.6)
Mean Plasma Glucose: 214 mg/dL

## 2016-03-23 NOTE — Progress Notes (Signed)
Glucose and A1C reviewed with Dr. Kalman Shan, instructed pt to take 1/2 of her regular insulin dose on the day of surgery per Dr. Kalman Shan orders. Pt verbalized understanding.

## 2016-03-24 ENCOUNTER — Emergency Department: Admission: RE | Admit: 2016-03-24 | Payer: Self-pay | Source: Ambulatory Visit

## 2016-03-24 ENCOUNTER — Emergency Department (HOSPITAL_COMMUNITY): Payer: Managed Care, Other (non HMO)

## 2016-03-24 ENCOUNTER — Observation Stay (HOSPITAL_COMMUNITY): Payer: Managed Care, Other (non HMO)

## 2016-03-24 ENCOUNTER — Encounter (HOSPITAL_COMMUNITY): Payer: Self-pay | Admitting: Emergency Medicine

## 2016-03-24 ENCOUNTER — Emergency Department (HOSPITAL_BASED_OUTPATIENT_CLINIC_OR_DEPARTMENT_OTHER)
Admit: 2016-03-24 | Discharge: 2016-03-24 | Disposition: A | Discharge status: Home / Self Care | Payer: Managed Care, Other (non HMO) | Attending: Family Medicine | Admitting: Family Medicine

## 2016-03-24 ENCOUNTER — Observation Stay (HOSPITAL_COMMUNITY)
Admission: EM | Admit: 2016-03-24 | Discharge: 2016-03-25 | Disposition: A | Discharge status: Home / Self Care | Payer: Managed Care, Other (non HMO) | Attending: Internal Medicine | Admitting: Internal Medicine

## 2016-03-24 ENCOUNTER — Other Ambulatory Visit: Payer: Self-pay

## 2016-03-24 DIAGNOSIS — R0789 Other chest pain: Secondary | ICD-10-CM | POA: Diagnosis not present

## 2016-03-24 DIAGNOSIS — G459 Transient cerebral ischemic attack, unspecified: Secondary | ICD-10-CM

## 2016-03-24 DIAGNOSIS — R079 Chest pain, unspecified: Secondary | ICD-10-CM

## 2016-03-24 DIAGNOSIS — R200 Anesthesia of skin: Secondary | ICD-10-CM

## 2016-03-24 DIAGNOSIS — I672 Cerebral atherosclerosis: Secondary | ICD-10-CM | POA: Insufficient documentation

## 2016-03-24 DIAGNOSIS — Z01810 Encounter for preprocedural cardiovascular examination: Secondary | ICD-10-CM | POA: Diagnosis not present

## 2016-03-24 DIAGNOSIS — E785 Hyperlipidemia, unspecified: Secondary | ICD-10-CM | POA: Diagnosis not present

## 2016-03-24 DIAGNOSIS — E7800 Pure hypercholesterolemia, unspecified: Secondary | ICD-10-CM | POA: Insufficient documentation

## 2016-03-24 DIAGNOSIS — Z8249 Family history of ischemic heart disease and other diseases of the circulatory system: Secondary | ICD-10-CM | POA: Insufficient documentation

## 2016-03-24 DIAGNOSIS — G4733 Obstructive sleep apnea (adult) (pediatric): Secondary | ICD-10-CM | POA: Insufficient documentation

## 2016-03-24 DIAGNOSIS — C50411 Malignant neoplasm of upper-outer quadrant of right female breast: Secondary | ICD-10-CM | POA: Insufficient documentation

## 2016-03-24 DIAGNOSIS — R8271 Bacteriuria: Secondary | ICD-10-CM | POA: Insufficient documentation

## 2016-03-24 DIAGNOSIS — Z794 Long term (current) use of insulin: Secondary | ICD-10-CM | POA: Insufficient documentation

## 2016-03-24 DIAGNOSIS — R29898 Other symptoms and signs involving the musculoskeletal system: Secondary | ICD-10-CM

## 2016-03-24 DIAGNOSIS — F419 Anxiety disorder, unspecified: Secondary | ICD-10-CM | POA: Insufficient documentation

## 2016-03-24 DIAGNOSIS — E1151 Type 2 diabetes mellitus with diabetic peripheral angiopathy without gangrene: Secondary | ICD-10-CM | POA: Insufficient documentation

## 2016-03-24 DIAGNOSIS — Z888 Allergy status to other drugs, medicaments and biological substances status: Secondary | ICD-10-CM | POA: Insufficient documentation

## 2016-03-24 DIAGNOSIS — I10 Essential (primary) hypertension: Secondary | ICD-10-CM | POA: Diagnosis not present

## 2016-03-24 DIAGNOSIS — Z7982 Long term (current) use of aspirin: Secondary | ICD-10-CM | POA: Insufficient documentation

## 2016-03-24 DIAGNOSIS — D0512 Intraductal carcinoma in situ of left breast: Secondary | ICD-10-CM | POA: Insufficient documentation

## 2016-03-24 DIAGNOSIS — IMO0002 Reserved for concepts with insufficient information to code with codable children: Secondary | ICD-10-CM | POA: Diagnosis present

## 2016-03-24 DIAGNOSIS — Z170 Estrogen receptor positive status [ER+]: Secondary | ICD-10-CM

## 2016-03-24 DIAGNOSIS — M419 Scoliosis, unspecified: Secondary | ICD-10-CM | POA: Insufficient documentation

## 2016-03-24 DIAGNOSIS — Z90710 Acquired absence of both cervix and uterus: Secondary | ICD-10-CM | POA: Insufficient documentation

## 2016-03-24 DIAGNOSIS — E1165 Type 2 diabetes mellitus with hyperglycemia: Secondary | ICD-10-CM | POA: Insufficient documentation

## 2016-03-24 LAB — CBC WITH DIFFERENTIAL/PLATELET
Basophils Absolute: 0 10*3/uL (ref 0.0–0.1)
Basophils Relative: 0 %
EOS ABS: 0.6 10*3/uL (ref 0.0–0.7)
EOS PCT: 6 %
HCT: 41.3 % (ref 36.0–46.0)
Hemoglobin: 13.7 g/dL (ref 12.0–15.0)
LYMPHS ABS: 3.6 10*3/uL (ref 0.7–4.0)
Lymphocytes Relative: 38 %
MCH: 29.2 pg (ref 26.0–34.0)
MCHC: 33.2 g/dL (ref 30.0–36.0)
MCV: 88.1 fL (ref 78.0–100.0)
MONOS PCT: 6 %
Monocytes Absolute: 0.6 10*3/uL (ref 0.1–1.0)
Neutro Abs: 4.5 10*3/uL (ref 1.7–7.7)
Neutrophils Relative %: 50 %
PLATELETS: 293 10*3/uL (ref 150–400)
RBC: 4.69 MIL/uL (ref 3.87–5.11)
RDW: 13.9 % (ref 11.5–15.5)
WBC: 9.3 10*3/uL (ref 4.0–10.5)

## 2016-03-24 LAB — I-STAT CHEM 8, ED
BUN: 23 mg/dL — ABNORMAL HIGH (ref 6–20)
CREATININE: 0.9 mg/dL (ref 0.44–1.00)
Calcium, Ion: 1.18 mmol/L (ref 1.15–1.40)
Chloride: 102 mmol/L (ref 101–111)
GLUCOSE: 227 mg/dL — AB (ref 65–99)
HCT: 43 % (ref 36.0–46.0)
HEMOGLOBIN: 14.6 g/dL (ref 12.0–15.0)
Potassium: 4.1 mmol/L (ref 3.5–5.1)
Sodium: 141 mmol/L (ref 135–145)
TCO2: 28 mmol/L (ref 0–100)

## 2016-03-24 LAB — I-STAT TROPONIN, ED: TROPONIN I, POC: 0 ng/mL (ref 0.00–0.08)

## 2016-03-24 LAB — VAS US CAROTID
LCCAPDIAS: -16 cm/s
LCCAPSYS: -64 cm/s
LEFT ECA DIAS: -11 cm/s
LEFT VERTEBRAL DIAS: -8 cm/s
Left CCA dist dias: -20 cm/s
Left CCA dist sys: -71 cm/s
Left ICA dist dias: -23 cm/s
Left ICA dist sys: -61 cm/s
Left ICA prox dias: 10 cm/s
Left ICA prox sys: 29 cm/s
RCCADSYS: -52 cm/s
RCCAPDIAS: -14 cm/s
RIGHT ECA DIAS: -11 cm/s
RIGHT VERTEBRAL DIAS: -5 cm/s
Right CCA prox sys: -69 cm/s

## 2016-03-24 LAB — TROPONIN I

## 2016-03-24 LAB — COMPREHENSIVE METABOLIC PANEL
ALK PHOS: 91 U/L (ref 38–126)
ALT: 35 U/L (ref 14–54)
AST: 28 U/L (ref 15–41)
Albumin: 4.2 g/dL (ref 3.5–5.0)
Anion gap: 9 (ref 5–15)
BUN: 21 mg/dL — ABNORMAL HIGH (ref 6–20)
CALCIUM: 9.4 mg/dL (ref 8.9–10.3)
CO2: 27 mmol/L (ref 22–32)
CREATININE: 0.98 mg/dL (ref 0.44–1.00)
Chloride: 103 mmol/L (ref 101–111)
Glucose, Bld: 231 mg/dL — ABNORMAL HIGH (ref 65–99)
Potassium: 3.9 mmol/L (ref 3.5–5.1)
SODIUM: 139 mmol/L (ref 135–145)
Total Bilirubin: 0.4 mg/dL (ref 0.3–1.2)
Total Protein: 7.9 g/dL (ref 6.5–8.1)

## 2016-03-24 LAB — GLUCOSE, CAPILLARY: GLUCOSE-CAPILLARY: 240 mg/dL — AB (ref 65–99)

## 2016-03-24 LAB — APTT: aPTT: 27 seconds (ref 24–36)

## 2016-03-24 LAB — URINE MICROSCOPIC-ADD ON

## 2016-03-24 LAB — URINALYSIS, ROUTINE W REFLEX MICROSCOPIC
BILIRUBIN URINE: NEGATIVE
GLUCOSE, UA: NEGATIVE mg/dL
KETONES UR: NEGATIVE mg/dL
Nitrite: NEGATIVE
PH: 5 (ref 5.0–8.0)
Protein, ur: NEGATIVE mg/dL
Specific Gravity, Urine: 1.022 (ref 1.005–1.030)

## 2016-03-24 LAB — CBG MONITORING, ED
GLUCOSE-CAPILLARY: 109 mg/dL — AB (ref 65–99)
Glucose-Capillary: 238 mg/dL — ABNORMAL HIGH (ref 65–99)

## 2016-03-24 LAB — PROTIME-INR
INR: 0.94
Prothrombin Time: 12.6 seconds (ref 11.4–15.2)

## 2016-03-24 LAB — TSH: TSH: 1.754 u[IU]/mL (ref 0.350–4.500)

## 2016-03-24 LAB — RAPID URINE DRUG SCREEN, HOSP PERFORMED
AMPHETAMINES: NOT DETECTED
BARBITURATES: NOT DETECTED
BENZODIAZEPINES: NOT DETECTED
COCAINE: NOT DETECTED
Opiates: POSITIVE — AB
Tetrahydrocannabinol: NOT DETECTED

## 2016-03-24 LAB — ETHANOL: Alcohol, Ethyl (B): <5 mg/dL (ref <5)

## 2016-03-24 LAB — LIPASE, BLOOD: Lipase: 32 U/L (ref 11–51)

## 2016-03-24 MED ORDER — DEXTROSE 5 % IV SOLN
3.0000 g | INTRAVENOUS | Status: DC
  Filled 2016-03-24: qty 3000

## 2016-03-24 MED ORDER — INSULIN ASPART 100 UNIT/ML ~~LOC~~ SOLN
0.0000 [IU] | Freq: Three times a day (TID) | SUBCUTANEOUS | Status: DC
  Administered 2016-03-25: 3 [IU] via SUBCUTANEOUS
  Administered 2016-03-25: 8 [IU] via SUBCUTANEOUS

## 2016-03-24 MED ORDER — OXYCODONE-ACETAMINOPHEN 10-325 MG PO TABS: 1.0000 | ORAL_TABLET | Freq: Four times a day (QID) | ORAL | Status: DC | PRN

## 2016-03-24 MED ORDER — PANTOPRAZOLE SODIUM 40 MG PO TBEC
40.0000 mg | DELAYED_RELEASE_TABLET | Freq: Every day | ORAL | Status: DC
  Administered 2016-03-25: 40 mg via ORAL
  Filled 2016-03-24: qty 1

## 2016-03-24 MED ORDER — VENLAFAXINE HCL ER 150 MG PO CP24
150.0000 mg | ORAL_CAPSULE | Freq: Every day | ORAL | Status: DC
  Administered 2016-03-24 – 2016-03-25 (×2): 150 mg via ORAL
  Filled 2016-03-24: qty 2
  Filled 2016-03-24: qty 1
  Filled 2016-03-24: qty 2
  Filled 2016-03-24: qty 1

## 2016-03-24 MED ORDER — ASPIRIN 81 MG PO CHEW
324.0000 mg | CHEWABLE_TABLET | Freq: Once | ORAL | Status: AC
  Administered 2016-03-24: 324 mg via ORAL
  Filled 2016-03-24: qty 4

## 2016-03-24 MED ORDER — STROKE: EARLY STAGES OF RECOVERY BOOK
Freq: Once | Status: DC
  Filled 2016-03-24: qty 1

## 2016-03-24 MED ORDER — CHLORHEXIDINE GLUCONATE CLOTH 2 % EX PADS: 6.0000 | MEDICATED_PAD | Freq: Every day | CUTANEOUS | Status: DC

## 2016-03-24 MED ORDER — MIDAZOLAM HCL 2 MG/2ML IJ SOLN: 1.0000 mg | INTRAMUSCULAR | Status: DC | PRN

## 2016-03-24 MED ORDER — OXYCODONE HCL 5 MG PO TABS: 5.0000 mg | ORAL_TABLET | Freq: Four times a day (QID) | ORAL | Status: DC | PRN

## 2016-03-24 MED ORDER — OXYCODONE-ACETAMINOPHEN 5-325 MG PO TABS: 1.0000 | ORAL_TABLET | Freq: Four times a day (QID) | ORAL | Status: DC | PRN

## 2016-03-24 MED ORDER — CHLORHEXIDINE GLUCONATE CLOTH 2 % EX PADS: 6.0000 | MEDICATED_PAD | Freq: Once | CUTANEOUS | Status: DC

## 2016-03-24 MED ORDER — SCOPOLAMINE 1 MG/3DAYS TD PT72
1.0000 | MEDICATED_PATCH | Freq: Once | TRANSDERMAL | Status: DC | PRN
  Filled 2016-03-24: qty 1

## 2016-03-24 MED ORDER — ACETAMINOPHEN 325 MG PO TABS
650.0000 mg | ORAL_TABLET | ORAL | Status: DC | PRN
  Administered 2016-03-24 (×2): 650 mg via ORAL
  Filled 2016-03-24 (×2): qty 2

## 2016-03-24 MED ORDER — INSULIN ASPART 100 UNIT/ML ~~LOC~~ SOLN
0.0000 [IU] | Freq: Every day | SUBCUTANEOUS | Status: DC
  Administered 2016-03-24: 2 [IU] via SUBCUTANEOUS

## 2016-03-24 MED ORDER — FENTANYL CITRATE (PF) 100 MCG/2ML IJ SOLN
50.0000 ug | Freq: Once | INTRAMUSCULAR | Status: AC
  Administered 2016-03-24: 50 ug via INTRAVENOUS
  Filled 2016-03-24: qty 2

## 2016-03-24 MED ORDER — CHLORHEXIDINE GLUCONATE CLOTH 2 % EX PADS
6.0000 | MEDICATED_PAD | Freq: Once | CUTANEOUS | Status: DC
Start: 2016-03-28 — End: 2016-03-25

## 2016-03-24 MED ORDER — HYDROCHLOROTHIAZIDE 25 MG PO TABS
25.0000 mg | ORAL_TABLET | Freq: Every day | ORAL | Status: DC
  Administered 2016-03-25: 25 mg via ORAL
  Filled 2016-03-24: qty 1

## 2016-03-24 MED ORDER — CELECOXIB 200 MG PO CAPS: 400.0000 mg | ORAL_CAPSULE | ORAL | Status: DC

## 2016-03-24 MED ORDER — MONTELUKAST SODIUM 10 MG PO TABS
10.0000 mg | ORAL_TABLET | Freq: Every day | ORAL | Status: DC
  Administered 2016-03-24: 10 mg via ORAL
  Filled 2016-03-24: qty 1

## 2016-03-24 MED ORDER — FENTANYL CITRATE (PF) 100 MCG/2ML IJ SOLN: 50.0000 ug | INTRAMUSCULAR | Status: DC | PRN

## 2016-03-24 MED ORDER — SODIUM CHLORIDE 0.9 % IV SOLN: INTRAVENOUS | Status: DC

## 2016-03-24 MED ORDER — GADOBENATE DIMEGLUMINE 529 MG/ML IV SOLN
20.0000 mL | Freq: Once | INTRAVENOUS | Status: AC | PRN
  Administered 2016-03-24: 20 mL via INTRAVENOUS

## 2016-03-24 MED ORDER — DEXTROSE 5 % IV SOLN
1.0000 g | Freq: Once | INTRAVENOUS | Status: AC
  Administered 2016-03-24: 1 g via INTRAVENOUS
  Filled 2016-03-24: qty 10

## 2016-03-24 MED ORDER — ROSUVASTATIN CALCIUM 20 MG PO TABS
20.0000 mg | ORAL_TABLET | Freq: Every day | ORAL | Status: DC
  Administered 2016-03-24 – 2016-03-25 (×2): 20 mg via ORAL
  Filled 2016-03-24 (×2): qty 1

## 2016-03-24 MED ORDER — IRBESARTAN 300 MG PO TABS
300.0000 mg | ORAL_TABLET | Freq: Every day | ORAL | Status: DC
  Administered 2016-03-24 – 2016-03-25 (×2): 300 mg via ORAL
  Filled 2016-03-24 (×2): qty 1

## 2016-03-24 MED ORDER — LORAZEPAM 2 MG/ML IJ SOLN
1.0000 mg | Freq: Once | INTRAMUSCULAR | Status: AC
  Administered 2016-03-24: 1 mg via INTRAVENOUS
  Filled 2016-03-24: qty 1

## 2016-03-24 MED ORDER — GABAPENTIN 300 MG PO CAPS: 300.0000 mg | ORAL_CAPSULE | ORAL | Status: DC

## 2016-03-24 MED ORDER — ACETAMINOPHEN 500 MG PO TABS: 1000.0000 mg | ORAL_TABLET | ORAL | Status: DC

## 2016-03-24 MED ORDER — ACETAMINOPHEN 650 MG RE SUPP: 650.0000 mg | RECTAL | Status: DC | PRN

## 2016-03-24 MED ORDER — LACTATED RINGERS IV SOLN
INTRAVENOUS | Status: DC
  Administered 2016-03-24: 21:00:00 via INTRAVENOUS

## 2016-03-24 MED ORDER — SENNOSIDES-DOCUSATE SODIUM 8.6-50 MG PO TABS: 1.0000 | ORAL_TABLET | Freq: Every evening | ORAL | Status: DC | PRN

## 2016-03-24 MED ORDER — INSULIN GLARGINE 100 UNIT/ML ~~LOC~~ SOLN
45.0000 [IU] | Freq: Every day | SUBCUTANEOUS | Status: DC
  Administered 2016-03-25: 45 [IU] via SUBCUTANEOUS
  Filled 2016-03-24: qty 0.45

## 2016-03-24 MED ORDER — MOMETASONE FURO-FORMOTEROL FUM 100-5 MCG/ACT IN AERO
2.0000 | INHALATION_SPRAY | Freq: Two times a day (BID) | RESPIRATORY_TRACT | Status: DC
  Administered 2016-03-24: 2 via RESPIRATORY_TRACT
  Filled 2016-03-24: qty 8.8

## 2016-03-24 MED ORDER — ENOXAPARIN SODIUM 40 MG/0.4ML ~~LOC~~ SOLN
40.0000 mg | SUBCUTANEOUS | Status: DC
  Administered 2016-03-24: 40 mg via SUBCUTANEOUS
  Filled 2016-03-24: qty 0.4

## 2016-03-24 NOTE — ED Notes (Signed)
Pt reports that her HA went away after ativan and she cant tell any deficit, neuro exam was normal

## 2016-03-24 NOTE — Consult Note (Addendum)
Cardiology Consult    Patient ID: Destiny Beasley MRN: [id number], DOB/AGE: July 29, 1955   Admit date: 03/24/2016 Date of Consult: 03/24/2016  Primary Physician: Melinda Crutch, MD Reason for Consult: chest pain  Requesting Provider: Dr. Lonny Prude   Patient Profile    Destiny Beasley is a 60 year old female with a past medical history of DM, HLD, HTN, and OSA. She was admitted with left arm weakness, numbness and left face weakness. Admitted for stroke work up. Also had some chest tightness, cardiology consulted as she will be having a lumpectomy on 03/28/16 and cardiology was consulted for cardiac evaluation of risk.   History of Present Illness    Destiny Beasley has no prior cardiac history. She was admitted for TIA/stroke work up as she developed left arm weakness, numbness and left sided facial weakness. MRI of her brain showed no acute CVA.   She tells me that she did have some mild chest pain this am when she had her episode of left sided weakness. She says that sometimes she will get "twinges" of chest pain but it is transient in nature. This morning she had substernal tightness that was associated with some diaphoresis.   She walks on her treadmill every day for at least 30 minutes and says that she never gets any chest pain or SOB with walking.   She does have risk factors for CAD including DM and obesity. Last stress test was in 2013, which was negative for ischemia.   Past Medical History   Past Medical History:  Diagnosis Date  . Anxiety   . Diabetes mellitus   . Hypercholesteremia   . Hypertension   . 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 Sleep apnea     Past Surgical History:  Procedure Laterality Date  . ABDOMINAL HYSTERECTOMY    . BRAIN MENINGIOMA EXCISION  2005  . CESAREAN SECTION    . FOOT SURGERY Bilateral 2016   hammer toe surgery  . MENISCUS REPAIR Left 2015  . PITUITARY SURGERY    . THYROID SURGERY        Allergies  Allergies  Allergen Reactions  . Invokana [Canagliflozin] Other (See Comments)    Constant yeast infections    Inpatient Medications    .  stroke: mapping our early stages of recovery book   Does not apply Once  . enoxaparin (LOVENOX) injection  40 mg Subcutaneous Q24H  . hydrochlorothiazide  25 mg Oral Daily  . insulin aspart  0-15 Units Subcutaneous TID WC  . insulin aspart  0-5 Units Subcutaneous QHS  . Insulin Glargine  45 Units Subcutaneous Daily  . irbesartan  300 mg Oral Daily  . mometasone-formoterol  2 puff Inhalation BID  . montelukast  10 mg Oral QHS  . pantoprazole  40 mg Oral Daily  . rosuvastatin  20 mg Oral Daily  . venlafaxine XR  150 mg Oral Daily    Family History    Family History  Problem Relation Age of Onset  . Diabetes Mother   . Hyperlipidemia Mother   . Hypertension Mother     Social History    Social History   Social History  . Marital status: Single    Spouse name: N/A  . Number of children: N/A  . Years of education: N/A   Occupational History  . Not on file.   Social History Main Topics  . Smoking status: Never Smoker  . Smokeless tobacco: Never Used  .  Alcohol use 0.6 oz/week    1 Glasses of wine per week     Comment: 1 glass monthly  . Drug use: No  . Sexual activity: Yes    Birth control/ protection: Other-see comments     Comment: no cycles; pt had hyst   Other Topics Concern  . Not on file   Social History Narrative  . No narrative on file     Review of Systems    General:  No chills, fever, night sweats or weight changes.  Cardiovascular:  No chest pain, dyspnea on exertion, edema, orthopnea, palpitations, paroxysmal nocturnal dyspnea. Dermatological: No rash, lesions/masses Respiratory: No cough, dyspnea Urologic: No hematuria, dysuria Abdominal:   No nausea, vomiting, diarrhea, bright red blood per rectum, melena, or hematemesis Neurologic:  No visual changes, wkns, changes in mental  status. All other systems reviewed and are otherwise negative except as noted above.  Physical Exam    Blood pressure 149/82, pulse 86, temperature 97.9 F (36.6 C), temperature source Oral, resp. rate (!) 9, height 6' (1.829 m), weight 250 lb (113.4 kg), SpO2 99 %.  General: Pleasant, NAD Psych: Normal affect. Neuro: Alert and oriented X 3. Moves all extremities spontaneously. HEENT: Normal  Neck: Supple without bruits or JVD. Lungs:  Resp regular and unlabored, CTA. Heart: RRR no s3, s4, or murmurs. Abdomen: Soft, non-tender, non-distended, BS + x 4.  Extremities: No clubbing, cyanosis or edema. DP/PT/Radials 2+ and equal bilaterally.  Labs    Troponin [facility] Test)  Recent Labs  03/24/16 0852  TROPIPOC 0.00    Recent Labs  03/24/16 1500  TROPONINI <0.03   Lab Results  Component Value Date   WBC 9.3 03/24/2016   HGB 14.6 03/24/2016   HCT 43.0 03/24/2016   MCV 88.1 03/24/2016   PLT 293 03/24/2016    Recent Labs Lab 03/24/16 0834 03/24/16 0855  NA 139 141  K 3.9 4.1  CL 103 102  CO2 27  --   BUN 21* 23*  CREATININE 0.98 0.90  CALCIUM 9.4  --   PROT 7.9  --   BILITOT 0.4  --   ALKPHOS 91  --   ALT 35  --   AST 28  --   GLUCOSE 231* 227*   Lab Results  Component Value Date   CHOL 154 06/28/2015   HDL 50.30 06/28/2015   LDLCALC 85 06/28/2015   TRIG 93.0 06/28/2015   Lab Results  Component Value Date   DDIMER 0.33 11/11/2011     Radiology Studies    Dg Chest 2 View  Result Date: 03/24/2016 CLINICAL DATA:  60 year old female with chest pain and left upper extremity numbness since last night. Initial encounter. EXAM: CHEST  2 VIEW COMPARISON:  Chest radiographs 09/18/2011. FINDINGS: Stable to mildly lower lung volumes. Mediastinal contours remain normal. Visualized tracheal air column is within normal limits. No pneumothorax, pulmonary edema, pleural effusion or confluent pulmonary opacity. Mild scoliosis. No acute osseous abnormality  identified. Negative visible bowel gas pattern. IMPRESSION: No acute cardiopulmonary abnormality. Electronically Signed   By: Genevie Ann M.D.   On: 03/24/2016 08:29   Ct Head Wo Contrast  Result Date: 03/24/2016 CLINICAL DATA:  Left arm weakness.  Left face and arm numbness. EXAM: CT HEAD WITHOUT CONTRAST TECHNIQUE: Contiguous axial images were obtained from the base of the skull through the vertex without intravenous contrast. COMPARISON:  Brain MRI 11/06/2013 FINDINGS: Brain: No evidence of acute infarction, hemorrhage, hydrocephalus, or mass effect. 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 Previously T1 hyperintense posterior sellar and suprasellar mass has stable appearance. This mass may reflect a proteinaceous cyst. There changes of previous trans-sphenoidal hypophysectomy. Vascular: Mild atherosclerotic calcification on the carotid siphons. Skull: Remote right retromastoid craniectomy. Sinuses/Orbits: Negative IMPRESSION: 1. No acute finding. 2. 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 No brainstem or cerebellar mass effect to suggest interval enlargement. 3. Stable small sellar and suprasellar mass. Patient is status post trans-sphenoidal pituitary surgery. Electronically Signed   By: Monte Fantasia M.D.   On: 03/24/2016 08:31   Mr Virgel Paling [id number] Contrast  Result Date: 03/24/2016 CLINICAL DATA:  Emergency department patient with stroke-like symptoms. History of pituitary resection 2005. 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 EXAM: MRI HEAD WITHOUT AND WITH CONTRAST MRA HEAD WITHOUT CONTRAST TECHNIQUE: Multiplanar, multiecho pulse sequences of the brain and surrounding structures were obtained without and with intravenous contrast. Angiographic images of the head were obtained using MRA technique without contrast. CONTRAST:  38mL MULTIHANCE GADOBENATE DIMEGLUMINE 529  MG/ML IV SOLN COMPARISON:  MR brain 11/06/2013.  CT head earlier today. FINDINGS: The patient was unable to remain motionless for the exam. Small or subtle lesions could be overlooked. MRI HEAD FINDINGS Brain: No acute stroke, acute hemorrhage, hydrocephalus, or extra-axial fluid. Slight premature for age atrophy. Minor white matter disease, likely chronic microvascular ischemic change given the patient's severe diabetes. In the RIGHT CP angle, redemonstrated is a 7 x 18 x 13 mm en plaque meningioma, anterior to the IAC. Slight mass effect on the brainstem and contiguity with the RIGHT IAC, similar to 2015. This is associated with dorsal dural clival thickening, see for instance image 19 series 17. At the most superior aspect of the dural thickening, there is a T1 hyperintense posterior intrasellar lesion, roughly spherical, 8 mm diameter which does not display significant postcontrast enhancement. This abuts the posterior chiasm without significant displacement of such. All of these findings appear essentially stable in comparison with 2015 MRI. There is an intrasphenoid fat graft representing previous transsphenoidal approach for removal of pituitary tumor. Within the sella, no features suggestive of recurrent pituitary tumor. Vascular: Flow voids are maintained throughout the carotid, basilar, and vertebral arteries. There are no areas of chronic hemorrhage. Skull and upper cervical spine: Cerebellar tonsils and upper cervical region unremarkable. Normal marrow signal. Sinuses/Orbits: Mild chronic sinus disease.  Negative orbits. Other: None. MRA HEAD FINDINGS The internal carotid arteries are widely patent. The basilar artery is patent but diminutive related to BILATERAL fetal PCA anatomy. RIGHT vertebral is dominant. Moderate irregularity of both the RIGHT and LEFT anterior cerebral arteries in their A1 segments. Suspected 50% stenosis distal M1 segment LEFT MCA. Suspected 50% stenosis proximal M1 segment RIGHT  MCA. Moderate irregularity of the distal MCA and PCA branches suggesting intracranial atherosclerotic change. No saccular aneurysm. Motion degradation along with shortened acquisition time makes cerebellar branch evaluation difficult. No visible saccular aneurysm. IMPRESSION: Within limits for assessment on this motion degraded exam, no acute stroke is evident. Mild atrophy and small vessel disease. RIGHT CP angle meningioma, dorsal dural clival thickening, and dorsal intrasellar subcentimeter T1 hyperintense mass lesion, all stable from 2015; multiple contiguous meningiomas would be one unifying hypothesis for this appearance. Alternately, the sellar lesion could be unrelated, possible Rathke's cleft cyst. 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 Diffuse intracranial atherosclerotic disease, likely sequelae  of diabetes, without proximal large vessel occlusion or flow-limiting intracranial stenosis. Electronically Signed   By: Staci Righter M.D.   On: 03/24/2016 14:09   Mr Jeri Cos [id number] Contrast  Result Date: 03/24/2016 CLINICAL DATA:  Emergency department patient with stroke-like symptoms. History of pituitary resection 2005. 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 EXAM: MRI HEAD WITHOUT AND WITH CONTRAST MRA HEAD WITHOUT CONTRAST TECHNIQUE: Multiplanar, multiecho pulse sequences of the brain and surrounding structures were obtained without and with intravenous contrast. Angiographic images of the head were obtained using MRA technique without contrast. CONTRAST:  35mL MULTIHANCE GADOBENATE DIMEGLUMINE 529 MG/ML IV SOLN COMPARISON:  MR brain 11/06/2013.  CT head earlier today. FINDINGS: The patient was unable to remain motionless for the exam. Small or subtle lesions could be overlooked. MRI HEAD FINDINGS Brain: No acute stroke, acute hemorrhage, hydrocephalus, or extra-axial fluid. Slight premature for age atrophy.  Minor white matter disease, likely chronic microvascular ischemic change given the patient's severe diabetes. In the RIGHT CP angle, redemonstrated is a 7 x 18 x 13 mm en plaque meningioma, anterior to the IAC. Slight mass effect on the brainstem and contiguity with the RIGHT IAC, similar to 2015. This is associated with dorsal dural clival thickening, see for instance image 19 series 17. At the most superior aspect of the dural thickening, there is a T1 hyperintense posterior intrasellar lesion, roughly spherical, 8 mm diameter which does not display significant postcontrast enhancement. This abuts the posterior chiasm without significant displacement of such. All of these findings appear essentially stable in comparison with 2015 MRI. There is an intrasphenoid fat graft representing previous transsphenoidal approach for removal of pituitary tumor. Within the sella, no features suggestive of recurrent pituitary tumor. Vascular: Flow voids are maintained throughout the carotid, basilar, and vertebral arteries. There are no areas of chronic hemorrhage. Skull and upper cervical spine: Cerebellar tonsils and upper cervical region unremarkable. Normal marrow signal. Sinuses/Orbits: Mild chronic sinus disease.  Negative orbits. Other: None. MRA HEAD FINDINGS The internal carotid arteries are widely patent. The basilar artery is patent but diminutive related to BILATERAL fetal PCA anatomy. RIGHT vertebral is dominant. Moderate irregularity of both the RIGHT and LEFT anterior cerebral arteries in their A1 segments. Suspected 50% stenosis distal M1 segment LEFT MCA. Suspected 50% stenosis proximal M1 segment RIGHT MCA. Moderate irregularity of the distal MCA and PCA branches suggesting intracranial atherosclerotic change. No saccular aneurysm. Motion degradation along with shortened acquisition time makes cerebellar branch evaluation difficult. No visible saccular aneurysm. IMPRESSION: Within limits for assessment on this  motion degraded exam, no acute stroke is evident. Mild atrophy and small vessel disease. RIGHT CP angle meningioma, dorsal dural clival thickening, and dorsal intrasellar subcentimeter T1 hyperintense mass lesion, all stable from 2015; multiple contiguous meningiomas would be one unifying hypothesis for this appearance. Alternately, the sellar lesion could be unrelated, possible Rathke's cleft cyst. 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 Diffuse intracranial atherosclerotic disease, likely sequelae of diabetes, without proximal large vessel occlusion or flow-limiting intracranial stenosis. Electronically Signed   By: Staci Righter M.D.   On: 03/24/2016 14:09    EKG & Cardiac Imaging    EKG: NSR  Echocardiogram: pending   Assessment & Plan    1. Evaluation of cardiac risk for lumpectomy:   PREOPERATIVE CARDIAC RISK ASSESSMENT   Revised Cardiac Risk Index:  High Risk Surgery:No    Active CAD: No   CHF: No   Cerebrovascular Disease:  Yes  Diabetes:Yes ; On Insulin: Yes  CKD (Cr >~ 2):No   Total: 3  ACC/AHA Guidelines for "Clearance":  Step 1 - Need for Emergency Surgery: No   Step 2 - Active Cardiac Conditions (Unstable Angina, Decompensated HF, Significant  Arrhytmias - Complete HB, Mobitz II, Symptomatic VT or SVT, Severe Aortic Stenosis - mean gradient > 40 mmHg, Valve area < 1.0 cm2): No   Step 3 -  Low Risk Surgery: Yes   If Yes --> proceed to OR   Patient can complete at least 4 METS of activity without ischemic symptoms.     2. Chest pain: with atypical characteristics. Will follow Echo results to evaluate for wall abnormality. Troponin negative.  Can check a troponin the AM.   Signed, Arbutus Leas, NP 03/24/2016, 4:21 PM Pager: [phone]  I have examined the patient and reviewed assessment and plan and discussed with patient.  Agree with above as stated.  Atypical chest pain.  Nonexertional.  No problems while  exercising regularly on the treadmill and bike for an hour.  ECG is unremarkable.  No ST segment changes.  Chest pain occurred briefly while bending her right leg.  She has exercised in the past few days without difficulty.  No further cardiac testing needed before lumpectomy.  Patient has echo scheduled.  I don't think her chest sx were related to ischemia.    Larae Grooms

## 2016-03-24 NOTE — ED Notes (Signed)
Pt ambulates to the bathroom to void.  

## 2016-03-24 NOTE — ED Notes (Signed)
[facility] called to transport pt to [facility] 

## 2016-03-24 NOTE — ED Notes (Signed)
ED Provider at bedside. EDP LITTLE PRESENT TO REEVALUATE. PT STATES PAIN AND NUMBNESS RESOLVED. FAMILY PRESENT. PT AND FAMILY MADE AWARE OF POSSIBLE TIA AND WILL MOST LIKELY TRANSFER TO [place].

## 2016-03-24 NOTE — ED Provider Notes (Addendum)
[facility] DEPT Provider Note   CSN: [id number] Arrival date & time: 03/24/16  0710     History   Chief Complaint Chief Complaint  Patient presents with  . Back Pain  . Chest Pain  . Extremity Weakness    HPI Destiny Beasley is a 60 y.o. female.  60yo F w/ PMH including IDDM, HTN, HLD, breast cancer, OSA who p/w L sided numbness, L arm weakness, chest pain, and back pain. Patient reports that for the past few weeks, she has had intermittent tingling on the left side of her face that comes and goes. She also reports 1 episode of right-sided numbness a few weeks ago that resolved spontaneously. Last night she was having some left facial tingling and then began having tingling radiating down her left arm. She began having left shoulder pain radiating down her left arm. The pain woke her up from sleep; she took ibuprofen and hydrocodone then later used a heating pad with no relief of her pain. She eventually fell back asleep. Around 4 or 5 AM, she woke up with ongoing symptoms and noted that her left arm seemed weaker than her right and she had difficulty tying her shoes. The patient also reports intermittent episodes of central chest pain that feels like a tightness for the past few days. When it initially began, she took antacids thinking that it was reflux but had no relief. The pain occasionally radiates to her back, no sudden onset of ripping or tearing chest pain. The pain lasts for several minutes and resolves spontaneously. During the episodes, she does have associated nausea and diaphoresis. No shortness of breath. She endorses some chest tightness currently. She denies any problems with speech or balance. No visual changes. No CP or SOB with exertion. He shouldn't does note that she is scheduled for breast cancer surgery 4 days from now. Fevers, cough/cold symptoms, vomiting, diarrhea, or recent illness.   The history is provided by the patient.  Back Pain   Associated symptoms  include chest pain.  Chest Pain   Associated symptoms include back pain.  Extremity Weakness  Associated symptoms include chest pain.    Past Medical History:  Diagnosis Date  . Anxiety   . Diabetes mellitus   . Hypercholesteremia   . Hypertension   . Malignant neoplasm of upper-outer quadrant of right female breast ([place]) 02/24/2016  . Sleep apnea     Patient Active Problem List   Diagnosis Date Noted  . Malignant neoplasm of upper-outer quadrant of right female breast ([place]) 02/24/2016  . Type II diabetes mellitus, uncontrolled ([place]) 02/11/2014  . Essential hypertension, benign 09/07/2013  . Chest pain, atypical 11/11/2011  . Anxiety 11/11/2011  . Diabetes mellitus ([place]) 11/11/2011  . Hyperlipidemia 11/11/2011    Past Surgical History:  Procedure Laterality Date  . ABDOMINAL HYSTERECTOMY    . BRAIN MENINGIOMA EXCISION  2005  . CESAREAN SECTION    . FOOT SURGERY Bilateral 2016   hammer toe surgery  . MENISCUS REPAIR Left 2015  . PITUITARY SURGERY    . THYROID SURGERY      OB History    Gravida Para Term Preterm AB Living   4         2   SAB TAB Ectopic Multiple Live Births                   Home Medications    Prior to Admission medications   Medication Sig Start Date End Date Taking? Authorizing  Provider  APIDRA SOLOSTAR 100 UNIT/ML Solostar Pen INEJCT 15 UNITS INTO THE SKIN IN THE MORNING, 15 UNITS AT LUNCH AND 20 UNITS AT [facility] 03/14/16   Elayne Snare, MD  B-D UF III MINI PEN NEEDLES 31G X 5 MM MISC USE 4 DAILY 04/15/15   Elayne Snare, MD  Biotin 10000 MCG TABS Take by mouth.    Historical Provider, MD  Cholecalciferol (VITAMIN D-3 PO) Take by mouth.    Historical Provider, MD  Cyanocobalamin (VITAMIN B-12) 2500 MCG SUBL Place under the tongue.    Historical Provider, MD  DULERA 100-5 MCG/ACT AERO  09/03/13   Historical Provider, MD  estrogens, conjugated, (PREMARIN) 0.625 MG tablet Take 0.625 mg by mouth daily. Take daily for 21 days then do not take for 7  days.    Historical Provider, MD  glucose blood (ONETOUCH VERIO) test strip USE AS DIRECTED TO CHECK BLOOD SUGARS THREE TIMES DAILY Dx code E11.65 02/11/15   Elayne Snare, MD  hydrochlorothiazide (HYDRODIURIL) 25 MG tablet Take 25 mg by mouth daily.    Historical Provider, MD  ibuprofen (ADVIL,MOTRIN) 600 MG tablet  08/29/13   Historical Provider, MD  Insulin Glargine (TOUJEO SOLOSTAR) 300 UNIT/ML SOPN Inject 70 Units into the skin daily. Patient taking differently: Inject 45 Units into the skin daily.  11/30/14   Elayne Snare, MD  Liraglutide ([place]) 18 MG/3ML SOPN  01/17/14   Historical Provider, MD  meloxicam (MOBIC) 7.5 MG tablet Take 7.5 mg by mouth daily.    Historical Provider, MD  montelukast (SINGULAIR) 10 MG tablet Take 10 mg by mouth at bedtime.    Historical Provider, MD  Multiple Vitamin (MULITIVITAMIN WITH MINERALS) TABS Take 1 tablet by mouth daily.      Historical Provider, MD  olmesartan (BENICAR) 20 MG tablet Take 20 mg by mouth daily.    Historical Provider, MD  pantoprazole (PROTONIX) 40 MG tablet Take 40 mg by mouth daily.  03/28/13   Historical Provider, MD  rosuvastatin (CRESTOR) 20 MG tablet Take 20 mg by mouth daily.    Historical Provider, MD  valACYclovir (VALTREX) 1000 MG tablet  02/01/14   Historical Provider, MD  venlafaxine (EFFEXOR-XR) 150 MG 24 hr capsule Take 150 mg by mouth daily.      Historical Provider, MD    Family History Family History  Problem Relation Age of Onset  . Diabetes Mother   . Hyperlipidemia Mother   . Hypertension Mother     Social History Social History  Substance Use Topics  . Smoking status: Never Smoker  . Smokeless tobacco: Never Used  . Alcohol use 0.6 oz/week    1 Glasses of wine per week     Comment: 1 glass monthly     Allergies   Invokana [canagliflozin]   Review of Systems Review of Systems  Cardiovascular: Positive for chest pain.  Musculoskeletal: Positive for back pain and extremity weakness.     Physical  Exam Updated Vital Signs BP 151/83   Pulse 80   Temp 97.9 F (36.6 C) (Oral)   Resp 14   Ht 6' (1.829 m)   Wt 250 lb (113.4 kg)   SpO2 98%   BMI 33.91 kg/m   Physical Exam  Constitutional: She is oriented to person, place, and time. She appears well-developed and well-nourished. No distress.  Awake, alert  HENT:  Head: Normocephalic and atraumatic.  Eyes: Conjunctivae and EOM are normal. Pupils are equal, round, and reactive to light.  Neck: Neck supple.  Cardiovascular:  Normal rate, regular rhythm and normal heart sounds.   No murmur heard. Pulmonary/Chest: Effort normal and breath sounds normal. No respiratory distress.  Abdominal: Soft. Bowel sounds are normal. She exhibits no distension. There is no tenderness.  Musculoskeletal: She exhibits no edema.  Neurological: She is alert and oriented to person, place, and time. She has normal reflexes. No cranial nerve deficit.  Fluent speech, normal finger-to-nose testing on R and dysmetria on L; no clonus 5/5 strength RUE, BLE 4/5 strength LUE Decreased sensation L face, LUE Normal sensation RUE, BLE + Pronator drift L arm 3+ DTR L bicep 2+ DTR R bicep, BLE patellar DTR  Skin: Skin is warm and dry.  Psychiatric: She has a normal mood and affect. Judgment and thought content normal.  Nursing note and vitals reviewed.    ED Treatments / Results  Labs (all labs ordered are listed, but only abnormal results are displayed) Labs Reviewed  COMPREHENSIVE METABOLIC PANEL - Abnormal; Notable for the following:       Result Value   Glucose, Bld 231 (*)    BUN 21 (*)    All other components within normal limits  URINALYSIS, ROUTINE W REFLEX MICROSCOPIC (NOT AT [facility]) - Abnormal; Notable for the following:    APPearance CLOUDY (*)    Hgb urine dipstick TRACE (*)    Leukocytes, UA LARGE (*)    All other components within normal limits  RAPID URINE DRUG SCREEN, HOSP PERFORMED - Abnormal; Notable for the following:    Opiates  POSITIVE (*)    All other components within normal limits  URINE MICROSCOPIC-ADD ON - Abnormal; Notable for the following:    Squamous Epithelial / LPF 0-5 (*)    Bacteria, UA MANY (*)    All other components within normal limits  CBG MONITORING, ED - Abnormal; Notable for the following:    Glucose-Capillary 238 (*)    All other components within normal limits  I-STAT CHEM 8, ED - Abnormal; Notable for the following:    BUN 23 (*)    Glucose, Bld 227 (*)    All other components within normal limits  URINE CULTURE  LIPASE, BLOOD  CBC WITH DIFFERENTIAL/PLATELET  ETHANOL  PROTIME-INR  APTT  I-STAT TROPOININ, ED    EKG  EKG Interpretation None       Radiology Dg Chest 2 View  Result Date: 03/24/2016 CLINICAL DATA:  60 year old female with chest pain and left upper extremity numbness since last night. Initial encounter. EXAM: CHEST  2 VIEW COMPARISON:  Chest radiographs 09/18/2011. FINDINGS: Stable to mildly lower lung volumes. Mediastinal contours remain normal. Visualized tracheal air column is within normal limits. No pneumothorax, pulmonary edema, pleural effusion or confluent pulmonary opacity. Mild scoliosis. No acute osseous abnormality identified. Negative visible bowel gas pattern. IMPRESSION: No acute cardiopulmonary abnormality. Electronically Signed   By: Genevie Ann M.D.   On: 03/24/2016 08:29   Ct Head Wo Contrast  Result Date: 03/24/2016 CLINICAL DATA:  Left arm weakness.  Left face and arm numbness. EXAM: CT HEAD WITHOUT CONTRAST TECHNIQUE: Contiguous axial images were obtained from the base of the skull through the vertex without intravenous contrast. COMPARISON:  Brain MRI 11/06/2013 FINDINGS: Brain: No evidence of acute infarction, hemorrhage, hydrocephalus, or mass effect. 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 Previously T1 hyperintense posterior sellar and suprasellar mass has stable appearance. This mass may reflect  a proteinaceous cyst. There changes of previous trans-sphenoidal hypophysectomy. Vascular:  Mild atherosclerotic calcification on the carotid siphons. Skull: Remote right retromastoid craniectomy. Sinuses/Orbits: Negative IMPRESSION: 1. No acute finding. 2. 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 No brainstem or cerebellar mass effect to suggest interval enlargement. 3. Stable small sellar and suprasellar mass. Patient is status post trans-sphenoidal pituitary surgery. Electronically Signed   By: Monte Fantasia M.D.   On: 03/24/2016 08:31    Procedures Procedures (including critical care time)  Medications Ordered in ED Medications  cefTRIAXone (ROCEPHIN) 1 g in dextrose 5 % 50 mL IVPB (not administered)  fentaNYL (SUBLIMAZE) injection 50 mcg (50 mcg Intravenous Given 03/24/16 0845)  aspirin chewable tablet 324 mg (324 mg Oral Given 03/24/16 0857)     Initial Impression / Assessment and Plan / ED Course  I have reviewed the triage vital signs and the nursing notes.  Pertinent labs & imaging results that were available during my care of the patient were reviewed by me and considered in my medical decision making (see chart for details).  Clinical Course    This is a pleasant 60yo F w/ several weeks of intermittent left-sided facial tingling, a few days of intermittent chest tightness associated with nausea and diaphoresis, and left shoulder radiating down left arm pain and tingling since last night. Left arm weakness since this morning. She was awake and alert, comfortable on exam with reassuring vital signs. GCS 15. She did have left arm weakness and dysmetria, decreased sensation of left face and left arm. Because her numbness began last night, she is outside of the stroke window to call a code stroke. Initiated stroke workup with above blood work, CT head, EKG, and chest x-ray. Regarding her chest pain, she does have risk factors for ACS; gave aspirin and  fentanyl for pain but avoided NTG to avoid drops in BP in event she is having ischemic stroke. I considered PE given her risk factor of breast cancer, but she denies any SOB and her pain is an intermittent tightness with nausea and diaphoresis which is more concerning for cardiac pathology than lung pathology.   Labs show normal trop, UA c/w infection. Because of patient's back pain and h/o diabetes, will treat for UTI w/ CTX, culture sent. CT head negative acute. CXR negative.  Discussed with neurology, Dr. Shon Hale, who will see the patient in consultation and agreed with transfer to [facility] for admission to workup stroke. I discussed admission w/ [facility], Dr. Lonny Prude, who will arrange for admission to hospitalist service for further care. Reexamination, the patient's left arm weakness has resolved, suggesting possible TIA. Patient will be transferred to [facility] for further care.  Final Clinical Impressions(s) / ED Diagnoses   Final diagnoses:  Left sided numbness  Left arm weakness  Chest pain, unspecified type    New Prescriptions New Prescriptions   No medications on file     Sharlett Iles, MD 03/24/16 [place], MD 03/24/16 1011

## 2016-03-24 NOTE — ED Notes (Signed)
Patient transported to CT 

## 2016-03-24 NOTE — Progress Notes (Signed)
**  Preliminary report by tech**  Carotid artery duplex complete. Findings are consistent with a 1-39 percent stenosis involving the right internal carotid artery and the left internal carotid artery. The vertebral arteries demonstrate antegrade flow.  03/24/16 12:00 PM Destiny Beasley RVT

## 2016-03-24 NOTE — ED Notes (Signed)
Attempted to call report to [facility] 45M. RN unable to take report at this time. Will call back.

## 2016-03-24 NOTE — Consult Note (Signed)
Admission H&P    Chief Complaint: Right-sided left side weakness and numbness.  HPI: Destiny Beasley is an 60 y.o. female history of hypertension, hyperlipidemia, diabetes mellitus and breast cancer who experienced an episode of numbness involving left face arm and leg as well as weakness of left arm on 03/23/2016. Subsequently resolved. She had no facial droop and no speech changes. There is no previous history of stroke nor TIA. She has not been on antiplatelet therapy. CT scan of her head showed no acute intracranial abnormality. MRI showed no signs of an acute stroke. Right CP angle meningioma, dorsal dural clival thickening, and dorsal intrasellar T1 hyperintense mass lesion are unchanged from 2015. MRA showed no significant major vessel occlusion or significant stenosis intracranially. Carotid Doppler and echocardiogram results pending. NIH stroke score is time of this evaluation was 0.  LSN: 6:00 PM on 03/23/2016 tPA Given: No: Deficits resolved mRankin:  Past Medical History:  Diagnosis Date  . Anxiety   . Diabetes mellitus   . Hypercholesteremia   . Hypertension   . 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 Sleep apnea     Past Surgical History:  Procedure Laterality Date  . ABDOMINAL HYSTERECTOMY    . BRAIN MENINGIOMA EXCISION  2005  . CESAREAN SECTION    . FOOT SURGERY Bilateral 2016   hammer toe surgery  . MENISCUS REPAIR Left 2015  . PITUITARY SURGERY    . THYROID SURGERY      Family History  Problem Relation Age of Onset  . Diabetes Mother   . Hyperlipidemia Mother   . Hypertension Mother    Social History:  reports that she has never smoked. She has never used smokeless tobacco. She reports that she drinks about 0.6 oz of alcohol per week . She reports that she does not use drugs.  Allergies:  Allergies  Allergen Reactions  . Invokana [Canagliflozin] Other (See Comments)    Constant yeast infections    Medications  Prior to Admission  Medication Sig Dispense Refill  . APIDRA SOLOSTAR 100 UNIT/ML Solostar Pen INEJCT 15 UNITS INTO THE SKIN IN THE MORNING, 15 UNITS AT LUNCH AND 20 UNITS AT DINNER (Patient taking differently: INEJCT 15-20 subcutaneously units three times daily, inject 15 UNITS INTO THE SKIN IN THE MORNING, 15 UNITS AT LUNCH AND 20 UNITS AT DINNER) 15 pen 3  . B-D UF III MINI PEN NEEDLES 31G X 5 MM MISC USE 4 DAILY 200 each 3  . Biotin 97847 MCG TABS Take by mouth.    . Cholecalciferol (VITAMIN D-3 PO) Take by mouth.    . Cyanocobalamin (VITAMIN B-12) 2500 MCG SUBL Place under the tongue.    . DULERA 100-5 MCG/ACT AERO Inhale 2 puffs into the lungs 2 (two) times daily as needed for wheezing or shortness of breath (allergies).     Marland Kitchen glucose blood (ONETOUCH VERIO) test strip USE AS DIRECTED TO CHECK BLOOD SUGARS THREE TIMES DAILY Dx code E11.65 150 each 3  . hydrochlorothiazide (HYDRODIURIL) 25 MG tablet Take 25 mg by mouth daily.    Marland Kitchen ibuprofen (ADVIL,MOTRIN) 800 MG tablet Take 800-1,600 mg by mouth every 8 (eight) hours as needed for headache or moderate pain.    . Insulin Glargine (TOUJEO SOLOSTAR) 300 UNIT/ML SOPN Inject 70 Units into the skin daily. (Patient taking differently: Inject 45 Units into the skin daily. ) 8 pen 0  . Liraglutide (VICTOZA) 18 MG/3ML SOPN Inject 1.8 mg into the skin  daily. Around 1500-1600pm    . meloxicam (MOBIC) 7.5 MG tablet Take 7.5 mg by mouth daily.    . montelukast (SINGULAIR) 10 MG tablet Take 10 mg by mouth at bedtime.    . Multiple Vitamin (MULITIVITAMIN WITH MINERALS) TABS Take 1 tablet by mouth daily.      Marland Kitchen olmesartan (BENICAR) 40 MG tablet Take 40 mg by mouth daily.    Marland Kitchen oxyCODONE-acetaminophen (PERCOCET) 10-325 MG tablet Take 1 tablet by mouth every 6 (six) hours as needed for pain.    . pantoprazole (PROTONIX) 40 MG tablet Take 40 mg by mouth daily.     . rosuvastatin (CRESTOR) 20 MG tablet Take 20 mg by mouth daily.    . valACYclovir (VALTREX) 500 MG  tablet Take 1,000 mg by mouth daily as needed (flareups/outbreaks).    . venlafaxine (EFFEXOR-XR) 150 MG 24 hr capsule Take 150 mg by mouth daily.      . cefadroxil (DURICEF) 500 MG capsule Take 500 mg by mouth 2 (two) times daily.      ROS: Constitutional: Negative for chills and fever.  Respiratory: Positive for wheezing. Negative for cough, sputum production and shortness of breath.   Cardiovascular: Positive for chest pain. Negative for palpitations and leg swelling.  Gastrointestinal: Negative for abdominal pain, constipation, diarrhea, nausea and vomiting.  Neurological: Positive for tingling, sensory change and focal weakness. Negative for speech change and loss of consciousness.  All other systems reviewed and are negative  Physical Examination: Blood pressure 130/64, pulse 92, temperature 98.4 F (36.9 C), temperature source Oral, resp. rate 16, height 6' (1.829 m), weight 119.7 kg (263 lb 14.4 oz), SpO2 100 %.  HEENT-  Normocephalic, no lesions, without obvious abnormality.  Normal external eye and conjunctiva.  Normal TM's bilaterally.  Normal auditory canals and external ears. Normal external nose, mucus membranes and septum.  Normal pharynx. Neck supple with no masses, nodes, nodules or enlargement. Cardiovascular - regular rate and rhythm, S1, S2 normal, no murmur, click, rub or gallop Lungs - chest clear, no wheezing, rales, normal symmetric air entry Abdomen - soft, non-tender; bowel sounds normal; no masses,  no organomegaly Extremities - no joint deformities, effusion, or inflammation and no edema  Neurologic Examination: Mental Status: Alert, oriented, thought content appropriate.  Speech fluent without evidence of aphasia. Able to follow commands without difficulty. Cranial Nerves: II-Visual fields were normal. III/IV/VI-Pupils were equal and reacted normally to light. Extraocular movements were full and conjugate.    V/VII-no facial numbness and no facial  weakness. VIII-normal. X-normal speech and symmetrical palatal movement. XI: trapezius strength/neck flexion strength normal bilaterally XII-midline tongue extension with normal strength. Motor: 5/5 bilaterally with normal tone and bulk Sensory: Normal throughout. Deep Tendon Reflexes: 1+ and symmetric. Plantars: Mute bilaterally Cerebellar: Normal finger-to-nose testing. Carotid auscultation: Normal  Results for orders placed or performed during the hospital encounter of 03/24/16 (from the past 48 hour(s))  CBG monitoring, ED     Status: Abnormal   Collection Time: 03/24/16  7:21 AM  Result Value Ref Range   Glucose-Capillary 238 (H) 65 - 99 mg/dL  Comprehensive metabolic panel     Status: Abnormal   Collection Time: 03/24/16  8:34 AM  Result Value Ref Range   Sodium 139 135 - 145 mmol/L   Potassium 3.9 3.5 - 5.1 mmol/L   Chloride 103 101 - 111 mmol/L   CO2 27 22 - 32 mmol/L   Glucose, Bld 231 (H) 65 - 99 mg/dL   BUN 21 (H)  6 - 20 mg/dL   Creatinine, Ser 0.98 0.44 - 1.00 mg/dL   Calcium 9.4 8.9 - 10.3 mg/dL   Total Protein 7.9 6.5 - 8.1 g/dL   Albumin 4.2 3.5 - 5.0 g/dL   AST 28 15 - 41 U/L   ALT 35 14 - 54 U/L   Alkaline Phosphatase 91 38 - 126 U/L   Total Bilirubin 0.4 0.3 - 1.2 mg/dL   GFR calc non Af Amer >60 >60 mL/min   GFR calc Af Amer >60 >60 mL/min    Comment: (NOTE) The eGFR has been calculated using the CKD EPI equation. This calculation has not been validated in all clinical situations. eGFR's persistently <60 mL/min signify possible Chronic Kidney Disease.    Anion gap 9 5 - 15  Lipase, blood     Status: None   Collection Time: 03/24/16  8:34 AM  Result Value Ref Range   Lipase 32 11 - 51 U/L  CBC with Differential     Status: None   Collection Time: 03/24/16  8:34 AM  Result Value Ref Range   WBC 9.3 4.0 - 10.5 K/uL   RBC 4.69 3.87 - 5.11 MIL/uL   Hemoglobin 13.7 12.0 - 15.0 g/dL   HCT 41.3 36.0 - 46.0 %   MCV 88.1 78.0 - 100.0 fL   MCH 29.2 26.0  - 34.0 pg   MCHC 33.2 30.0 - 36.0 g/dL   RDW 13.9 11.5 - 15.5 %   Platelets 293 150 - 400 K/uL   Neutrophils Relative % 50 %   Neutro Abs 4.5 1.7 - 7.7 K/uL   Lymphocytes Relative 38 %   Lymphs Abs 3.6 0.7 - 4.0 K/uL   Monocytes Relative 6 %   Monocytes Absolute 0.6 0.1 - 1.0 K/uL   Eosinophils Relative 6 %   Eosinophils Absolute 0.6 0.0 - 0.7 K/uL   Basophils Relative 0 %   Basophils Absolute 0.0 0.0 - 0.1 K/uL  Protime-INR     Status: None   Collection Time: 03/24/16  8:34 AM  Result Value Ref Range   Prothrombin Time 12.6 11.4 - 15.2 seconds   INR 0.94   APTT     Status: None   Collection Time: 03/24/16  8:34 AM  Result Value Ref Range   aPTT 27 24 - 36 seconds  TSH     Status: None   Collection Time: 03/24/16  8:34 AM  Result Value Ref Range   TSH 1.754 0.350 - 4.500 uIU/mL    Comment: Performed by a 3rd Generation assay with a functional sensitivity of <=0.01 uIU/mL.  Ethanol     Status: None   Collection Time: 03/24/16  8:35 AM  Result Value Ref Range   Alcohol, Ethyl (B) <5 <5 mg/dL    Comment:        LOWEST DETECTABLE LIMIT FOR SERUM ALCOHOL IS 5 mg/dL FOR MEDICAL PURPOSES ONLY   Urinalysis, Routine w reflex microscopic     Status: Abnormal   Collection Time: 03/24/16  8:38 AM  Result Value Ref Range   Color, Urine YELLOW YELLOW   APPearance CLOUDY (A) CLEAR   Specific Gravity, Urine 1.022 1.005 - 1.030   pH 5.0 5.0 - 8.0   Glucose, UA NEGATIVE NEGATIVE mg/dL   Hgb urine dipstick TRACE (A) NEGATIVE   Bilirubin Urine NEGATIVE NEGATIVE   Ketones, ur NEGATIVE NEGATIVE mg/dL   Protein, ur NEGATIVE NEGATIVE mg/dL   Nitrite NEGATIVE NEGATIVE   Leukocytes, UA LARGE (A) NEGATIVE  Urine rapid drug screen (hosp performed)not at [facility]     Status: Abnormal   Collection Time: 03/24/16  8:38 AM  Result Value Ref Range   Opiates POSITIVE (A) NONE DETECTED   Cocaine NONE DETECTED NONE DETECTED   Benzodiazepines NONE DETECTED NONE DETECTED   Amphetamines NONE DETECTED  NONE DETECTED   Tetrahydrocannabinol NONE DETECTED NONE DETECTED   Barbiturates NONE DETECTED NONE DETECTED    Comment:        DRUG SCREEN FOR MEDICAL PURPOSES ONLY.  IF CONFIRMATION IS NEEDED FOR ANY PURPOSE, NOTIFY LAB WITHIN 5 DAYS.        LOWEST DETECTABLE LIMITS FOR URINE DRUG SCREEN Drug Class       Cutoff (ng/mL) Amphetamine      1000 Barbiturate      200 Benzodiazepine   756 Tricyclics       433 Opiates          300 Cocaine          300 THC              50   Urine microscopic-add on     Status: Abnormal   Collection Time: 03/24/16  8:38 AM  Result Value Ref Range   Squamous Epithelial / LPF 0-5 (A) NONE SEEN   WBC, UA TOO NUMEROUS TO COUNT 0 - 5 WBC/hpf   RBC / HPF 6-30 0 - 5 RBC/hpf   Bacteria, UA MANY (A) NONE SEEN  I-stat troponin, ED     Status: None   Collection Time: 03/24/16  8:52 AM  Result Value Ref Range   Troponin i, poc 0.00 0.00 - 0.08 ng/mL   Comment 3            Comment: Due to the release kinetics of cTnI, a negative result within the first hours of the onset of symptoms does not rule out myocardial infarction with certainty. If myocardial infarction is still suspected, repeat the test at appropriate intervals.   I-Stat Chem 8, ED  (not at [facility])     Status: Abnormal   Collection Time: 03/24/16  8:55 AM  Result Value Ref Range   Sodium 141 135 - 145 mmol/L   Potassium 4.1 3.5 - 5.1 mmol/L   Chloride 102 101 - 111 mmol/L   BUN 23 (H) 6 - 20 mg/dL   Creatinine, Ser 0.90 0.44 - 1.00 mg/dL   Glucose, Bld 227 (H) 65 - 99 mg/dL   Calcium, Ion 1.18 1.15 - 1.40 mmol/L   TCO2 28 0 - 100 mmol/L   Hemoglobin 14.6 12.0 - 15.0 g/dL   HCT 43.0 36.0 - 46.0 %  Troponin I     Status: None   Collection Time: 03/24/16  3:00 PM  Result Value Ref Range   Troponin I <0.03 <0.03 ng/mL  CBG monitoring, ED     Status: Abnormal   Collection Time: 03/24/16  4:09 PM  Result Value Ref Range   Glucose-Capillary 109 (H) 65 - 99 mg/dL   Comment 1 Notify RN     Dg Chest 2 View  Result Date: 03/24/2016 CLINICAL DATA:  60 year old female with chest pain and left upper extremity numbness since last night. Initial encounter. EXAM: CHEST  2 VIEW COMPARISON:  Chest radiographs 09/18/2011. FINDINGS: Stable to mildly lower lung volumes. Mediastinal contours remain normal. Visualized tracheal air column is within normal limits. No pneumothorax, pulmonary edema, pleural effusion or confluent pulmonary opacity. Mild scoliosis. No acute osseous abnormality identified. Negative visible bowel  gas pattern. IMPRESSION: No acute cardiopulmonary abnormality. Electronically Signed   By: Genevie Ann M.D.   On: 03/24/2016 08:29   Ct Head Wo Contrast  Result Date: 03/24/2016 CLINICAL DATA:  Left arm weakness.  Left face and arm numbness. EXAM: CT HEAD WITHOUT CONTRAST TECHNIQUE: Contiguous axial images were obtained from the base of the skull through the vertex without intravenous contrast. COMPARISON:  Brain MRI 11/06/2013 FINDINGS: Brain: No evidence of acute infarction, hemorrhage, hydrocephalus, or mass effect. 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 Previously T1 hyperintense posterior sellar and suprasellar mass has stable appearance. This mass may reflect a proteinaceous cyst. There changes of previous trans-sphenoidal hypophysectomy. Vascular: Mild atherosclerotic calcification on the carotid siphons. Skull: Remote right retromastoid craniectomy. Sinuses/Orbits: Negative IMPRESSION: 1. No acute finding. 2. 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 No brainstem or cerebellar mass effect to suggest interval enlargement. 3. Stable small sellar and suprasellar mass. Patient is status post trans-sphenoidal pituitary surgery. Electronically Signed   By: Monte Fantasia M.D.   On: 03/24/2016 08:31   Mr Virgel Paling QZ Contrast  Result Date: 03/24/2016 CLINICAL DATA:  Emergency department patient with  stroke-like symptoms. History of pituitary resection 2005. 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 EXAM: MRI HEAD WITHOUT AND WITH CONTRAST MRA HEAD WITHOUT CONTRAST TECHNIQUE: Multiplanar, multiecho pulse sequences of the brain and surrounding structures were obtained without and with intravenous contrast. Angiographic images of the head were obtained using MRA technique without contrast. CONTRAST:  39m MULTIHANCE GADOBENATE DIMEGLUMINE 529 MG/ML IV SOLN COMPARISON:  MR brain 11/06/2013.  CT head earlier today. FINDINGS: The patient was unable to remain motionless for the exam. Small or subtle lesions could be overlooked. MRI HEAD FINDINGS Brain: No acute stroke, acute hemorrhage, hydrocephalus, or extra-axial fluid. Slight premature for age atrophy. Minor white matter disease, likely chronic microvascular ischemic change given the patient's severe diabetes. In the RIGHT CP angle, redemonstrated is a 7 x 18 x 13 mm en plaque meningioma, anterior to the IAC. Slight mass effect on the brainstem and contiguity with the RIGHT IAC, similar to 2015. This is associated with dorsal dural clival thickening, see for instance image 19 series 17. At the most superior aspect of the dural thickening, there is a T1 hyperintense posterior intrasellar lesion, roughly spherical, 8 mm diameter which does not display significant postcontrast enhancement. This abuts the posterior chiasm without significant displacement of such. All of these findings appear essentially stable in comparison with 2015 MRI. There is an intrasphenoid fat graft representing previous transsphenoidal approach for removal of pituitary tumor. Within the sella, no features suggestive of recurrent pituitary tumor. Vascular: Flow voids are maintained throughout the carotid, basilar, and vertebral arteries. There are no areas of chronic hemorrhage. Skull and upper cervical spine: Cerebellar tonsils and upper  cervical region unremarkable. Normal marrow signal. Sinuses/Orbits: Mild chronic sinus disease.  Negative orbits. Other: None. MRA HEAD FINDINGS The internal carotid arteries are widely patent. The basilar artery is patent but diminutive related to BILATERAL fetal PCA anatomy. RIGHT vertebral is dominant. Moderate irregularity of both the RIGHT and LEFT anterior cerebral arteries in their A1 segments. Suspected 50% stenosis distal M1 segment LEFT MCA. Suspected 50% stenosis proximal M1 segment RIGHT MCA. Moderate irregularity of the distal MCA and PCA branches suggesting intracranial atherosclerotic change. No saccular aneurysm. Motion degradation along with shortened acquisition time makes cerebellar  branch evaluation difficult. No visible saccular aneurysm. IMPRESSION: Within limits for assessment on this motion degraded exam, no acute stroke is evident. Mild atrophy and small vessel disease. RIGHT CP angle meningioma, dorsal dural clival thickening, and dorsal intrasellar subcentimeter T1 hyperintense mass lesion, all stable from 2015; multiple contiguous meningiomas would be one unifying hypothesis for this appearance. Alternately, the sellar lesion could be unrelated, possible Rathke's cleft cyst. 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 Diffuse intracranial atherosclerotic disease, likely sequelae of diabetes, without proximal large vessel occlusion or flow-limiting intracranial stenosis. Electronically Signed   By: Staci Righter M.D.   On: 03/24/2016 14:09   Mr Jeri Cos FB Contrast  Result Date: 03/24/2016 CLINICAL DATA:  Emergency department patient with stroke-like symptoms. History of pituitary resection 2005. 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 EXAM: MRI HEAD WITHOUT AND WITH CONTRAST MRA HEAD WITHOUT CONTRAST TECHNIQUE: Multiplanar, multiecho pulse sequences of the brain and surrounding structures were  obtained without and with intravenous contrast. Angiographic images of the head were obtained using MRA technique without contrast. CONTRAST:  43m MULTIHANCE GADOBENATE DIMEGLUMINE 529 MG/ML IV SOLN COMPARISON:  MR brain 11/06/2013.  CT head earlier today. FINDINGS: The patient was unable to remain motionless for the exam. Small or subtle lesions could be overlooked. MRI HEAD FINDINGS Brain: No acute stroke, acute hemorrhage, hydrocephalus, or extra-axial fluid. Slight premature for age atrophy. Minor white matter disease, likely chronic microvascular ischemic change given the patient's severe diabetes. In the RIGHT CP angle, redemonstrated is a 7 x 18 x 13 mm en plaque meningioma, anterior to the IAC. Slight mass effect on the brainstem and contiguity with the RIGHT IAC, similar to 2015. This is associated with dorsal dural clival thickening, see for instance image 19 series 17. At the most superior aspect of the dural thickening, there is a T1 hyperintense posterior intrasellar lesion, roughly spherical, 8 mm diameter which does not display significant postcontrast enhancement. This abuts the posterior chiasm without significant displacement of such. All of these findings appear essentially stable in comparison with 2015 MRI. There is an intrasphenoid fat graft representing previous transsphenoidal approach for removal of pituitary tumor. Within the sella, no features suggestive of recurrent pituitary tumor. Vascular: Flow voids are maintained throughout the carotid, basilar, and vertebral arteries. There are no areas of chronic hemorrhage. Skull and upper cervical spine: Cerebellar tonsils and upper cervical region unremarkable. Normal marrow signal. Sinuses/Orbits: Mild chronic sinus disease.  Negative orbits. Other: None. MRA HEAD FINDINGS The internal carotid arteries are widely patent. The basilar artery is patent but diminutive related to BILATERAL fetal PCA anatomy. RIGHT vertebral is dominant. Moderate  irregularity of both the RIGHT and LEFT anterior cerebral arteries in their A1 segments. Suspected 50% stenosis distal M1 segment LEFT MCA. Suspected 50% stenosis proximal M1 segment RIGHT MCA. Moderate irregularity of the distal MCA and PCA branches suggesting intracranial atherosclerotic change. No saccular aneurysm. Motion degradation along with shortened acquisition time makes cerebellar branch evaluation difficult. No visible saccular aneurysm. IMPRESSION: Within limits for assessment on this motion degraded exam, no acute stroke is evident. Mild atrophy and small vessel disease. RIGHT CP angle meningioma, dorsal dural clival thickening, and dorsal intrasellar subcentimeter T1 hyperintense mass lesion, all stable from 2015; multiple contiguous meningiomas would be one unifying hypothesis for this appearance. Alternately, the sellar lesion could be unrelated, possible Rathke's cleft cyst. 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 Diffuse intracranial atherosclerotic disease, likely sequelae  of diabetes, without proximal large vessel occlusion or flow-limiting intracranial stenosis. Electronically Signed   By: Staci Righter M.D.   On: 03/24/2016 14:09    Assessment: 60 y.o. female with multiple risk factors for stroke presenting with probable right subcortical cerebral TIA. Patient has no residual clinical deficits at this point and MRI showed no signs of an acute infarction.   Stroke Risk Factors - diabetes mellitus, family history, hyperlipidemia and hypertension  Plan: 1. HgbA1c, fasting lipid panel 2. MRI, MRA  of the brain without contrast 3. PT consult, OT consult, Speech consult 4. Echocardiogram 5. Carotid dopplers 6. Prophylactic therapy-Antiplatelet med: Aspirin  7. Risk factor modification 8. Telemetry monitoring  C.R. Nicole Kindred, MD [facility] [phone]  03/24/2016, 8:28 PM

## 2016-03-24 NOTE — ED Triage Notes (Signed)
Patient states she has lower back pain, associated with left arm and left side of face.  She has some tingling on left side of face.  Denies any visual disturbance.  Pain started 11-30, pain started in upper neck and has radiated to left arm. Has intermittent chest pain.

## 2016-03-24 NOTE — Progress Notes (Signed)
Patient  Is admitted to 5M16 from [facility] ED. Admission Vs is stable and patient is AOX4. Family is at the bedside

## 2016-03-24 NOTE — ED Notes (Signed)
Patient transported to MRI 

## 2016-03-24 NOTE — H&P (Addendum)
History and Physical    Destiny Beasley [id number] DOB: [birth year] DOA: 03/24/2016  PCP: Melinda Crutch, MD Patient coming from: Home  Chief Complaint: Left sided weakness/numbness  HPI: Destiny Beasley is a 60 y.o. female with medical history significant of diabetes mellitus on insulin, hypertension, hyperlipidemia, breast cancer, obstructive sleep apnea. Patient presents today with a history of left-sided arm weakness, numbness with associated left face numbness. She reports having these episodes intermittently for the past few weeks. Last night, she had associated weakness. Pain starts in shoulder and radiates down her left arm. She reports intermittent episodes of chest tightness with some diaphoresis. No dyspnea. No palpitations. She has tried antacids which did not help with any symptoms. She is not exerting her self during these episodes.   ED Course: Vitals: Afebrile. Slightly hypertensive. Labs: BUN of 21. I-STAT troponin of 0. Glucose of 231. Imaging: CT significant for no acute findings. CXR unremarkable. Medications/Course: Aspirin, fentanyl and ceftriaxone given.  Review of Systems: Review of Systems  Constitutional: Negative for chills and fever.  Respiratory: Positive for wheezing. Negative for cough, sputum production and shortness of breath.   Cardiovascular: Positive for chest pain. Negative for palpitations and leg swelling.  Gastrointestinal: Negative for abdominal pain, constipation, diarrhea, nausea and vomiting.  Neurological: Positive for tingling, sensory change and focal weakness. Negative for speech change and loss of consciousness.  All other systems reviewed and are negative.   Past Medical History:  Diagnosis Date  . Anxiety   . Diabetes mellitus   . Hypercholesteremia   . Hypertension   . Malignant neoplasm of upper-outer quadrant of right female breast ([place]) 02/24/2016  . Sleep apnea     Past Surgical History:  Procedure Laterality Date  . ABDOMINAL  HYSTERECTOMY    . BRAIN MENINGIOMA EXCISION  2005  . CESAREAN SECTION    . FOOT SURGERY Bilateral 2016   hammer toe surgery  . MENISCUS REPAIR Left 2015  . PITUITARY SURGERY    . THYROID SURGERY       reports that she has never smoked. She has never used smokeless tobacco. She reports that she drinks about 0.6 oz of alcohol per week . She reports that she does not use drugs.  Allergies  Allergen Reactions  . Invokana [Canagliflozin]     Constant yeast infections    Family History  Problem Relation Age of Onset  . Diabetes Mother   . Hyperlipidemia Mother   . Hypertension Mother     Prior to Admission medications   Medication Sig Start Date End Date Taking? Authorizing Provider  APIDRA SOLOSTAR 100 UNIT/ML Solostar Pen INEJCT 15 UNITS INTO THE SKIN IN THE MORNING, 15 UNITS AT LUNCH AND 20 UNITS AT [facility] 03/14/16   Elayne Snare, MD  B-D UF III MINI PEN NEEDLES 31G X 5 MM MISC USE 4 DAILY 04/15/15   Elayne Snare, MD  Biotin 10000 MCG TABS Take by mouth.    Historical Provider, MD  Cholecalciferol (VITAMIN D-3 PO) Take by mouth.    Historical Provider, MD  Cyanocobalamin (VITAMIN B-12) 2500 MCG SUBL Place under the tongue.    Historical Provider, MD  DULERA 100-5 MCG/ACT AERO  09/03/13   Historical Provider, MD  estrogens, conjugated, (PREMARIN) 0.625 MG tablet Take 0.625 mg by mouth daily. Take daily for 21 days then do not take for 7 days.    Historical Provider, MD  glucose blood (ONETOUCH VERIO) test strip USE AS DIRECTED TO CHECK BLOOD SUGARS THREE TIMES DAILY  Dx code E11.65 02/11/15   Elayne Snare, MD  hydrochlorothiazide (HYDRODIURIL) 25 MG tablet Take 25 mg by mouth daily.    Historical Provider, MD  ibuprofen (ADVIL,MOTRIN) 600 MG tablet  08/29/13   Historical Provider, MD  Insulin Glargine (TOUJEO SOLOSTAR) 300 UNIT/ML SOPN Inject 70 Units into the skin daily. Patient taking differently: Inject 45 Units into the skin daily.  11/30/14   Elayne Snare, MD  Liraglutide ([place]) 18  MG/3ML SOPN  01/17/14   Historical Provider, MD  meloxicam (MOBIC) 7.5 MG tablet Take 7.5 mg by mouth daily.    Historical Provider, MD  montelukast (SINGULAIR) 10 MG tablet Take 10 mg by mouth at bedtime.    Historical Provider, MD  Multiple Vitamin (MULITIVITAMIN WITH MINERALS) TABS Take 1 tablet by mouth daily.      Historical Provider, MD  olmesartan (BENICAR) 20 MG tablet Take 20 mg by mouth daily.    Historical Provider, MD  pantoprazole (PROTONIX) 40 MG tablet Take 40 mg by mouth daily.  03/28/13   Historical Provider, MD  rosuvastatin (CRESTOR) 20 MG tablet Take 20 mg by mouth daily.    Historical Provider, MD  valACYclovir (VALTREX) 1000 MG tablet  02/01/14   Historical Provider, MD  venlafaxine (EFFEXOR-XR) 150 MG 24 hr capsule Take 150 mg by mouth daily.      Historical Provider, MD    Physical Exam: Vitals:   03/24/16 0841 03/24/16 0930 03/24/16 1000 03/24/16 1030  BP: 143/85 151/83 152/82 149/87  Pulse: 79 80 79 79  Resp: 16 14 13 22   Temp:      TempSrc:      SpO2: 99% 98% 99% 98%  Weight:      Height:         Constitutional: NAD, calm, comfortable Eyes: PERRL, lids and conjunctivae normal ENMT: Mucous membranes are moist. Posterior pharynx clear of any exudate or lesions. Normal dentition.  Neck: normal, supple, no masses, no thyromegaly Respiratory: clear to auscultation bilaterally, no wheezing, no crackles. Normal respiratory effort. No accessory muscle use.  Cardiovascular: Regular rate and rhythm, no murmurs / rubs / gallops. No extremity edema. 2+ pedal pulses. No carotid bruits.  Abdomen: no tenderness, no masses palpated. No hepatosplenomegaly. Bowel sounds positive.  Musculoskeletal: no clubbing / cyanosis. No joint deformity upper and lower extremities. Good ROM, no contractures. Normal muscle tone.  Skin: no rashes, lesions, ulcers. No induration Neurologic: CN 2-12 grossly intact. Sensation decreased in LUE and LLE, DTR normal. Strength 5/5 in all 4.    Psychiatric: Normal judgment and insight. Alert and oriented x 3. Normal mood.    Labs on Admission: I have personally reviewed following labs and imaging studies  CBC:  Recent Labs Lab 03/22/16 1507 03/24/16 0834 03/24/16 0855  WBC 8.3 9.3  --   NEUTROABS 3.7 4.5  --   HGB 13.6 13.7 14.6  HCT 40.8 41.3 43.0  MCV 88.5 88.1  --   PLT 297 293  --    Basic Metabolic Panel:  Recent Labs Lab 03/22/16 1507 03/24/16 0834 03/24/16 0855  NA 135 139 141  K 4.1 3.9 4.1  CL 97* 103 102  CO2 30 27  --   GLUCOSE 339* 231* 227*  BUN 14 21* 23*  CREATININE 1.03* 0.98 0.90  CALCIUM 9.5 9.4  --    GFR: Estimated Creatinine Clearance: 95.9 mL/min (by C-G formula based on SCr of 0.9 mg/dL). Liver Function Tests:  Recent Labs Lab 03/22/16 1507 03/24/16 0834  AST 26  28  ALT 38 35  ALKPHOS 86 91  BILITOT 0.4 0.4  PROT 7.4 7.9  ALBUMIN 3.9 4.2    Recent Labs Lab 03/24/16 0834  LIPASE 32   No results for input(s): AMMONIA in the last 168 hours. Coagulation Profile:  Recent Labs Lab 03/24/16 0834  INR 0.94   Cardiac Enzymes: No results for input(s): CKTOTAL, CKMB, CKMBINDEX, TROPONINI in the last 168 hours. BNP (last 3 results) No results for input(s): PROBNP in the last 8760 hours. HbA1C:  Recent Labs  03/22/16 1508  HGBA1C 9.1*   CBG:  Recent Labs Lab 03/24/16 0721  GLUCAP 238*   Lipid Profile: No results for input(s): CHOL, HDL, LDLCALC, TRIG, CHOLHDL, LDLDIRECT in the last 72 hours. Thyroid Function Tests: No results for input(s): TSH, T4TOTAL, FREET4, T3FREE, THYROIDAB in the last 72 hours. Anemia Panel: No results for input(s): VITAMINB12, FOLATE, FERRITIN, TIBC, IRON, RETICCTPCT in the last 72 hours. Urine analysis:    Component Value Date/Time   COLORURINE YELLOW 03/24/2016 0838   APPEARANCEUR CLOUDY (A) 03/24/2016 0838   LABSPEC 1.022 03/24/2016 0838   PHURINE 5.0 03/24/2016 0838   GLUCOSEU NEGATIVE 03/24/2016 0838   GLUCOSEU NEGATIVE  12/16/2013 0756   HGBUR TRACE (A) 03/24/2016 0838   BILIRUBINUR NEGATIVE 03/24/2016 0838   BILIRUBINUR neg 06/26/2011 1646   KETONESUR NEGATIVE 03/24/2016 0838   PROTEINUR NEGATIVE 03/24/2016 0838   UROBILINOGEN 1.0 12/16/2013 0756   NITRITE NEGATIVE 03/24/2016 0838   LEUKOCYTESUR LARGE (A) 03/24/2016 0838   Sepsis Labs: !!!!!!!!!!!!!!!!!!!!!!!!!!!!!!!!!!!!!!!!!!!! @LABRCNTIP (procalcitonin:4,lacticidven:4) )No results found for this or any previous visit (from the past 240 hour(s)).   Radiological Exams on Admission: Dg Chest 2 View  Result Date: 03/24/2016 CLINICAL DATA:  60 year old female with chest pain and left upper extremity numbness since last night. Initial encounter. EXAM: CHEST  2 VIEW COMPARISON:  Chest radiographs 09/18/2011. FINDINGS: Stable to mildly lower lung volumes. Mediastinal contours remain normal. Visualized tracheal air column is within normal limits. No pneumothorax, pulmonary edema, pleural effusion or confluent pulmonary opacity. Mild scoliosis. No acute osseous abnormality identified. Negative visible bowel gas pattern. IMPRESSION: No acute cardiopulmonary abnormality. Electronically Signed   By: Genevie Ann M.D.   On: 03/24/2016 08:29   Ct Head Wo Contrast  Result Date: 03/24/2016 CLINICAL DATA:  Left arm weakness.  Left face and arm numbness. EXAM: CT HEAD WITHOUT CONTRAST TECHNIQUE: Contiguous axial images were obtained from the base of the skull through the vertex without intravenous contrast. COMPARISON:  Brain MRI 11/06/2013 FINDINGS: Brain: No evidence of acute infarction, hemorrhage, hydrocephalus, or mass effect. 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 Previously T1 hyperintense posterior sellar and suprasellar mass has stable appearance. This mass may reflect a proteinaceous cyst. There changes of previous trans-sphenoidal hypophysectomy. Vascular: Mild atherosclerotic calcification on the carotid siphons. Skull:  Remote right retromastoid craniectomy. Sinuses/Orbits: Negative IMPRESSION: 1. No acute finding. 2. 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 No brainstem or cerebellar mass effect to suggest interval enlargement. 3. Stable small sellar and suprasellar mass. Patient is status post trans-sphenoidal pituitary surgery. Electronically Signed   By: Monte Fantasia M.D.   On: 03/24/2016 08:31    EKG: Independently reviewed. Sinus rhythm, prolonged PR. No t-wave abnormalities  Assessment/Plan Principal Problem:   TIA (transient ischemic attack) Active Problems:   Chest pain   Hyperlipidemia   Essential hypertension, benign   Type II diabetes mellitus, uncontrolled ([place])  Malignant neoplasm of upper-outer quadrant of right female breast ([place])   ?TIA Patient's episodes are concerning for TIA. Neurology on board. Stoke workup. -MRI brain w/o contrast -MRA brain -carotid dopplers (placed while at [facility], may need to be reordered if arrives at [facility] beforehand) -echocardiogram -Lipid panel  Chest tightness Moderately suspicious with diaphoresis during episodes. Heart score of 4. EKG not suggestive of ACS and troponin negative. 90s on room air. No tachycardia. Wells PE score of 1 for malignancy -telemetry -repeat EKG in AM -cycle troponin -TSH -consulted cardiology since patient will be having surgery soon.  Breast cancer, DCIS Recently diagnosed. Followed by Lindi Adie. Plan for lumpectomy on 12/5 followed by radiation therapy and antiestrogen therapy  Diabetes mellitus Last A1C of 9.1 on 03/22/2016. On Lantus, Apidra and Victoza as an outpatient. -SSI -continue Lantus 45u daily  History of meningioma Followed by [facility]. Not well visualized on CT  Pituitary tumor S/p resection. Followed by Dr. Elayne Snare. Not on hormone therapy.  ?UTI Asymptomatic. Given ceftriaxone x1 in ED. Urinalysis suggests bacteria. -hold  ceftriaxone -urine culture pending   DVT prophylaxis: Lovenox Code Status: Full code Family Communication: Best friend at bedside Disposition Plan: Anticipate discharge home tomorrow after stroke workup is complete Consults called: Neurology, Cardiology Admission status: Observation, telemetry   Cordelia Poche, MD [facility] Pager [phone]  If 7PM-7AM, please contact night-coverage [email or web address] Password [email or web address]  03/24/2016, 11:08 AM

## 2016-03-24 NOTE — ED Notes (Signed)
ED Provider at bedside.EDP LITTLE PRESENT. Chest pain started several weeks ago however increased last night. Increases  With deep breathing. Facial left side for several weeks. Last night radiated to left arm and hand. Left arm and hand weakness began last night at 2100. Pt states pt has rt arm and hand numbness several weeks ago however that went aware. Denies lower ext. Numbness and weakness. Denies blurred vision or speech problems.

## 2016-03-24 NOTE — ED Notes (Signed)
Pt is still in MRI.

## 2016-03-24 NOTE — ED Notes (Signed)
Attempted to call report again to [facility] 74M. RN unable to take report. Will call back.

## 2016-03-24 NOTE — ED Notes (Signed)
Patient transported to X-ray 

## 2016-03-24 NOTE — Progress Notes (Signed)
Inpatient Diabetes Program Recommendations  AACE/ADA: New Consensus Statement on Inpatient Glycemic Control (2015)  Target Ranges:  Prepandial:   less than 140 mg/dL      Peak postprandial:   less than 180 mg/dL (1-2 hours)      Critically ill patients:  140 - 180 mg/dL   Lab Results  Component Value Date   [id number] (H) 03/24/2016   HGBA1C 9.1 (H) 03/22/2016    Review of Glycemic Control  Diabetes history: DM2 Outpatient Diabetes medications: Toujeo 45 units QD, Apidra 15-15-20 units tidwc, Victoza 1.8 mg QD Current orders for Inpatient glycemic control: None Pt is NPO and will likely transfer to [facility].  Inpatient Diabetes Program Recommendations:    Add Novolog moderate Q4H. Add Lantus 15 units Q24H. (approx 1/3 of home dose)  Will continually follow to assess insulin needs.  Thank you. Lorenda Peck, RD, LDN, CDE Inpatient Diabetes Coordinator [phone]

## 2016-03-25 ENCOUNTER — Observation Stay (HOSPITAL_BASED_OUTPATIENT_CLINIC_OR_DEPARTMENT_OTHER): Payer: Managed Care, Other (non HMO)

## 2016-03-25 ENCOUNTER — Other Ambulatory Visit (HOSPITAL_COMMUNITY): Payer: Self-pay

## 2016-03-25 DIAGNOSIS — E785 Hyperlipidemia, unspecified: Secondary | ICD-10-CM | POA: Diagnosis not present

## 2016-03-25 DIAGNOSIS — G459 Transient cerebral ischemic attack, unspecified: Secondary | ICD-10-CM | POA: Diagnosis not present

## 2016-03-25 DIAGNOSIS — Z794 Long term (current) use of insulin: Secondary | ICD-10-CM

## 2016-03-25 DIAGNOSIS — R079 Chest pain, unspecified: Secondary | ICD-10-CM

## 2016-03-25 DIAGNOSIS — E1165 Type 2 diabetes mellitus with hyperglycemia: Secondary | ICD-10-CM | POA: Diagnosis not present

## 2016-03-25 DIAGNOSIS — R0789 Other chest pain: Secondary | ICD-10-CM | POA: Diagnosis not present

## 2016-03-25 LAB — LIPID PANEL
CHOLESTEROL: 118 mg/dL (ref 0–200)
HDL: 39 mg/dL — AB (ref >40)
LDL Cholesterol: 61 mg/dL (ref 0–99)
TRIGLYCERIDES: 88 mg/dL (ref <150)
Total CHOL/HDL Ratio: 3 RATIO
VLDL: 18 mg/dL (ref 0–40)

## 2016-03-25 LAB — GLUCOSE, CAPILLARY
GLUCOSE-CAPILLARY: 166 mg/dL — AB (ref 65–99)
Glucose-Capillary: 273 mg/dL — ABNORMAL HIGH (ref 65–99)

## 2016-03-25 LAB — ECHOCARDIOGRAM COMPLETE
HEIGHTINCHES: 72 in
Weight: 4222.4 oz

## 2016-03-25 LAB — URINE CULTURE: SPECIAL REQUESTS: NORMAL

## 2016-03-25 MED ORDER — ASPIRIN 325 MG PO TBEC: 325.0000 mg | DELAYED_RELEASE_TABLET | Freq: Every day | ORAL | 0 refills | Status: DC

## 2016-03-25 MED ORDER — ASPIRIN EC 325 MG PO TBEC
325.0000 mg | DELAYED_RELEASE_TABLET | Freq: Every day | ORAL | Status: DC
  Administered 2016-03-25: 325 mg via ORAL
  Filled 2016-03-25: qty 1

## 2016-03-25 NOTE — Progress Notes (Addendum)
PT Cancellation Note  Patient Details Name: Destiny Beasley MRN: [id number] DOB: 1956-01-09   Cancelled Treatment:    Reason Eval/Treat Not Completed: PT screened, no needs identified, will sign off  I spoke with Ms. Cletus Gash. She reports no difficulty with mobililty, has been going to the bathroom without assistance. Was independent and active PTA. She reports her arm and face are back to normal as well, aside from some neck pain. Gross screen demonstrates good coordination and strength in LUE.  Per discussion with pt PT will sign off. Please re-consult PT if anything were to change.   Melvern Banker 03/25/2016, 9:04 AM Lavonia Dana, PT  [phone] 03/25/2016

## 2016-03-25 NOTE — Progress Notes (Signed)
Pt is being discharge home. Discharge instructions were given to patient. She is waiting for a ride at this moment.

## 2016-03-25 NOTE — Progress Notes (Signed)
Informed by RN that patient wears CPAP at home.  Asked patient is she wanted to wear a hospital supplied CPAP, she declined.

## 2016-03-25 NOTE — Discharge Summary (Signed)
PATIENT DETAILS Name: Destiny Beasley Age: 60 y.o. Sex: female Date of Birth: [birth year] MRN: [id number]. Admitting Physician: Mariel Aloe, MD [id number] Harrington Challenger, MD  Admit Date: 03/24/2016 Discharge date: 03/25/2016  Recommendations for Outpatient Follow-up:  1. Follow up with PCP in 1-2 weeks 2. Please obtain BMP/CBC in one week  Admitted From:  Home  Disposition: Home with home health services   [place]: No  Equipment/Devices: None  Discharge Condition: Stable  CODE STATUS: FULL CODE  Diet recommendation:  Heart Healthy / Carb Modified   Brief Summary: See H&P, Labs, Consult and Test reports for all details in brief, patient is a 60 year old female with history of insulin-dependent type 2 diabetes, hypertension, recently diagnosed breast mast scheduled for lumpectomy/biopsy next week, admitted with chest pain and left-sided weakness. See below for further details. was admitted for further details.  Brief Hospital Course: TIA: No focal deficits on exam this morning. She is requesting discharge. MRI brain negative for acute CVA, carotid Doppler did not show any significant stenosis, echocardiogram showed preserved EF without any embolic source. LDL 61, most recent A1c on 11/29 at 9.1. Spoke with stroke M.D., recommendations are to discharge on full dose aspirin. She is already on statin which will be continued on discharge.  Chest pain: Felt to be atypical, EKG and cardiac enzymes negative. Seen by cardiology, no further workup recommended at this time.  Dyslipidemia: LDL 61-continue Crestor.  Insulin-dependent type 2 diabetes: Last A1c 9.1, continue home regimen of Lantus. Follow-up with PCP for further optimization.  History of meningioma: Continue outpatient follow-up with neurosurgery.  Asymptomatic bacteriuria: Although UA suggestive of UTI-patient asymptomatic, no indication for antimicrobial therapy at this point.  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  Procedures/Studies: None  Discharge Diagnoses:  Principal Problem:   TIA (transient ischemic attack) Active Problems:   Chest pain   Hyperlipidemia   Essential hypertension, benign   Type II diabetes mellitus, uncontrolled ([place])   Malignant neoplasm of upper-outer quadrant of right female breast [facility])   Discharge Instructions:  Activity:  As tolerated with Full fall precautions use walker/cane & assistance as needed   Discharge Instructions    Ambulatory referral to Neurology    Complete by:  As directed    Diet - low sodium heart healthy    Complete by:  As directed    Discharge instructions    Complete by:  As directed    Follow with Primary MD  Melinda Crutch, MD  and Dr Leonie Man [facility]  Please get a complete blood count and chemistry panel checked by your Primary MD at your next visit, and again as instructed by your Primary MD.  Get Medicines reviewed and adjusted: Please take all your medications with you for your next visit with your Primary MD  Laboratory/radiological data: Please request your Primary MD to go over all hospital tests and procedure/radiological results at the follow up, please ask your Primary MD to get all Hospital records sent to his/her office.  In some cases, they will be blood work, cultures and biopsy results pending at the time of your discharge. Please request that your primary care M.D. follows up on these results.  Also Note the following: If you experience worsening of your admission symptoms, develop shortness of breath, life threatening emergency, suicidal or homicidal thoughts you must seek medical attention immediately by calling 911 or calling your MD immediately  if symptoms  less severe.  You must read complete instructions/literature along with all the possible adverse reactions/side effects for all the Medicines you take and that have been prescribed  to you. Take any new Medicines after you have completely understood and accpet all the possible adverse reactions/side effects.   Do not drive when taking Pain medications or sleeping medications (Benzodaizepines)  Do not take more than prescribed Pain, Sleep and Anxiety Medications. It is not advisable to combine anxiety,sleep and pain medications without talking with your primary care practitioner  Special Instructions: If you have smoked or chewed Tobacco  in the last 2 yrs please stop smoking, stop any regular Alcohol  and or any Recreational drug use.  Wear Seat belts while driving.  Please note: You were cared for by a hospitalist during your hospital stay. Once you are discharged, your primary care physician will handle any further medical issues. Please note that NO REFILLS for any discharge medications will be authorized once you are discharged, as it is imperative that you return to your primary care physician (or establish a relationship with a primary care physician if you do not have one) for your post hospital discharge needs so that they can reassess your need for medications and monitor your lab values.   Increase activity slowly    Complete by:  As directed        Medication List    STOP taking these medications   cefadroxil 500 MG capsule Commonly known as:  DURICEF   meloxicam 7.5 MG tablet Commonly known as:  MOBIC     TAKE these medications   APIDRA SOLOSTAR 100 UNIT/ML Solostar Pen Generic drug:  Insulin Glulisine INEJCT 15 UNITS INTO THE SKIN IN THE MORNING, 15 UNITS AT LUNCH AND 20 UNITS AT DINNER What changed:  See the new instructions.   aspirin 325 MG EC tablet Take 1 tablet (325 mg total) by mouth daily. Start taking on:  03/26/2016   B-D UF III MINI PEN NEEDLES 31G X 5 MM Misc Generic drug:  Insulin Pen Needle USE 4 DAILY   Biotin 10000 MCG Tabs Take by mouth.   DULERA 100-5 MCG/ACT Aero Generic drug:  mometasone-formoterol Inhale 2 puffs into  the lungs 2 (two) times daily as needed for wheezing or shortness of breath (allergies).   glucose blood test strip Commonly known as:  ONETOUCH VERIO USE AS DIRECTED TO CHECK BLOOD SUGARS THREE TIMES DAILY Dx code E11.65   hydrochlorothiazide 25 MG tablet Commonly known as:  HYDRODIURIL Take 25 mg by mouth daily.   ibuprofen 800 MG tablet Commonly known as:  ADVIL,MOTRIN Take 800-1,600 mg by mouth every 8 (eight) hours as needed for headache or moderate pain.   Insulin Glargine 300 UNIT/ML Sopn Commonly known as:  TOUJEO SOLOSTAR Inject 70 Units into the skin daily. What changed:  how much to take   montelukast 10 MG tablet Commonly known as:  SINGULAIR Take 10 mg by mouth at bedtime.   multivitamin with minerals Tabs tablet Take 1 tablet by mouth daily.   olmesartan 40 MG tablet Commonly known as:  BENICAR Take 40 mg by mouth daily.   oxyCODONE-acetaminophen 10-325 MG tablet Commonly known as:  PERCOCET Take 1 tablet by mouth every 6 (six) hours as needed for pain.   pantoprazole 40 MG tablet Commonly known as:  PROTONIX Take 40 mg by mouth daily.   rosuvastatin 20 MG tablet Commonly known as:  CRESTOR Take 20 mg by mouth daily.   valACYclovir 500  MG tablet Commonly known as:  VALTREX Take 1,000 mg by mouth daily as needed (flareups/outbreaks).   venlafaxine XR 150 MG 24 hr capsule Commonly known as:  EFFEXOR-XR Take 150 mg by mouth daily.   VICTOZA 18 MG/3ML Sopn Generic drug:  liraglutide Inject 1.8 mg into the skin daily. Around 1500-1600pm   Vitamin B-12 2500 MCG Subl Place under the tongue.   VITAMIN D-3 PO Take by mouth.      Follow-up Information    Melinda Crutch, MD. Schedule an appointment as soon as possible for a visit in 2 week(s).   Specialty:  Family Medicine Contact information: [place] [address] [phone]        Antony Contras, MD Follow up.   Specialties:  Neurology, Radiology Why:  office will call with  a follow up appointment,if you do not hear from them, please give them a call. Contact information: [address] [place] [address] [phone]          Allergies  Allergen Reactions  . Invokana [Canagliflozin] Other (See Comments)    Constant yeast infections   Consultations:   cardiology and neurology  Other Procedures/Studies: Dg Chest 2 View  Result Date: 03/24/2016 CLINICAL DATA:  60 year old female with chest pain and left upper extremity numbness since last night. Initial encounter. EXAM: CHEST  2 VIEW COMPARISON:  Chest radiographs 09/18/2011. FINDINGS: Stable to mildly lower lung volumes. Mediastinal contours remain normal. Visualized tracheal air column is within normal limits. No pneumothorax, pulmonary edema, pleural effusion or confluent pulmonary opacity. Mild scoliosis. No acute osseous abnormality identified. Negative visible bowel gas pattern. IMPRESSION: No acute cardiopulmonary abnormality. Electronically Signed   By: Genevie Ann M.D.   On: 03/24/2016 08:29   Ct Head Wo Contrast  Result Date: 03/24/2016 CLINICAL DATA:  Left arm weakness.  Left face and arm numbness. EXAM: CT HEAD WITHOUT CONTRAST TECHNIQUE: Contiguous axial images were obtained from the base of the skull through the vertex without intravenous contrast. COMPARISON:  Brain MRI 11/06/2013 FINDINGS: Brain: No evidence of acute infarction, hemorrhage, hydrocephalus, or mass effect. 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 Previously T1 hyperintense posterior sellar and suprasellar mass has stable appearance. This mass may reflect a proteinaceous cyst. There changes of previous trans-sphenoidal hypophysectomy. Vascular: Mild atherosclerotic calcification on the carotid siphons. Skull: Remote right retromastoid craniectomy. Sinuses/Orbits: Negative IMPRESSION: 1. No acute finding. 2. 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 No brainstem or cerebellar mass effect to suggest interval enlargement. 3. Stable small sellar and suprasellar mass. Patient is status post trans-sphenoidal pituitary surgery. Electronically Signed   By: Monte Fantasia M.D.   On: 03/24/2016 08:31   Mr Virgel Paling [id number] Contrast  Result Date: 03/24/2016 CLINICAL DATA:  Emergency department patient with stroke-like symptoms. History of pituitary resection 2005. 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 EXAM: MRI HEAD WITHOUT AND WITH CONTRAST MRA HEAD WITHOUT CONTRAST TECHNIQUE: Multiplanar, multiecho pulse sequences of the brain and surrounding structures were obtained without and with intravenous contrast. Angiographic images of the head were obtained using MRA technique without contrast. CONTRAST:  53mL MULTIHANCE GADOBENATE DIMEGLUMINE 529 MG/ML IV SOLN COMPARISON:  MR brain 11/06/2013.  CT head earlier today. FINDINGS: The patient was unable to remain motionless for the exam. Small or subtle lesions could be overlooked. MRI HEAD FINDINGS Brain: No acute stroke, acute hemorrhage,  hydrocephalus, or extra-axial fluid. Slight premature for age atrophy. Minor white matter disease, likely chronic microvascular ischemic change given the patient's severe diabetes. In the RIGHT CP angle, redemonstrated is a 7 x 18 x 13 mm en plaque meningioma, anterior to the IAC. Slight mass effect on the brainstem and contiguity with the RIGHT IAC, similar to 2015. This is associated with dorsal dural clival thickening, see for instance image 19 series 17. At the most superior aspect of the dural thickening, there is a T1 hyperintense posterior intrasellar lesion, roughly spherical, 8 mm diameter which does not display significant postcontrast enhancement. This abuts the posterior chiasm without significant displacement of such. All of these findings appear essentially stable in comparison with 2015 MRI. There is an  intrasphenoid fat graft representing previous transsphenoidal approach for removal of pituitary tumor. Within the sella, no features suggestive of recurrent pituitary tumor. Vascular: Flow voids are maintained throughout the carotid, basilar, and vertebral arteries. There are no areas of chronic hemorrhage. Skull and upper cervical spine: Cerebellar tonsils and upper cervical region unremarkable. Normal marrow signal. Sinuses/Orbits: Mild chronic sinus disease.  Negative orbits. Other: None. MRA HEAD FINDINGS The internal carotid arteries are widely patent. The basilar artery is patent but diminutive related to BILATERAL fetal PCA anatomy. RIGHT vertebral is dominant. Moderate irregularity of both the RIGHT and LEFT anterior cerebral arteries in their A1 segments. Suspected 50% stenosis distal M1 segment LEFT MCA. Suspected 50% stenosis proximal M1 segment RIGHT MCA. Moderate irregularity of the distal MCA and PCA branches suggesting intracranial atherosclerotic change. No saccular aneurysm. Motion degradation along with shortened acquisition time makes cerebellar branch evaluation difficult. No visible saccular aneurysm. IMPRESSION: Within limits for assessment on this motion degraded exam, no acute stroke is evident. Mild atrophy and small vessel disease. RIGHT CP angle meningioma, dorsal dural clival thickening, and dorsal intrasellar subcentimeter T1 hyperintense mass lesion, all stable from 2015; multiple contiguous meningiomas would be one unifying hypothesis for this appearance. Alternately, the sellar lesion could be unrelated, possible Rathke's cleft cyst. 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 Diffuse intracranial atherosclerotic disease, likely sequelae of diabetes, without proximal large vessel occlusion or flow-limiting intracranial stenosis. Electronically Signed   By: Staci Righter M.D.   On: 03/24/2016 14:09   Mr Jeri Cos [id number] Contrast  Result Date:  03/24/2016 CLINICAL DATA:  Emergency department patient with stroke-like symptoms. History of pituitary resection 2005. 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 EXAM: MRI HEAD WITHOUT AND WITH CONTRAST MRA HEAD WITHOUT CONTRAST TECHNIQUE: Multiplanar, multiecho pulse sequences of the brain and surrounding structures were obtained without and with intravenous contrast. Angiographic images of the head were obtained using MRA technique without contrast. CONTRAST:  36mL MULTIHANCE GADOBENATE DIMEGLUMINE 529 MG/ML IV SOLN COMPARISON:  MR brain 11/06/2013.  CT head earlier today. FINDINGS: The patient was unable to remain motionless for the exam. Small or subtle lesions could be overlooked. MRI HEAD FINDINGS Brain: No acute stroke, acute hemorrhage, hydrocephalus, or extra-axial fluid. Slight premature for age atrophy. Minor white matter disease, likely chronic microvascular ischemic change given the patient's severe diabetes. In the RIGHT CP angle, redemonstrated is a 7 x 18 x 13 mm en plaque meningioma, anterior to the IAC. Slight mass effect on the brainstem and contiguity with the RIGHT IAC, similar to 2015. This is associated with dorsal dural clival thickening, see for instance image 19 series 17. At the most superior aspect of the dural thickening, there is  a T1 hyperintense posterior intrasellar lesion, roughly spherical, 8 mm diameter which does not display significant postcontrast enhancement. This abuts the posterior chiasm without significant displacement of such. All of these findings appear essentially stable in comparison with 2015 MRI. There is an intrasphenoid fat graft representing previous transsphenoidal approach for removal of pituitary tumor. Within the sella, no features suggestive of recurrent pituitary tumor. Vascular: Flow voids are maintained throughout the carotid, basilar, and vertebral arteries. There are no areas of chronic hemorrhage.  Skull and upper cervical spine: Cerebellar tonsils and upper cervical region unremarkable. Normal marrow signal. Sinuses/Orbits: Mild chronic sinus disease.  Negative orbits. Other: None. MRA HEAD FINDINGS The internal carotid arteries are widely patent. The basilar artery is patent but diminutive related to BILATERAL fetal PCA anatomy. RIGHT vertebral is dominant. Moderate irregularity of both the RIGHT and LEFT anterior cerebral arteries in their A1 segments. Suspected 50% stenosis distal M1 segment LEFT MCA. Suspected 50% stenosis proximal M1 segment RIGHT MCA. Moderate irregularity of the distal MCA and PCA branches suggesting intracranial atherosclerotic change. No saccular aneurysm. Motion degradation along with shortened acquisition time makes cerebellar branch evaluation difficult. No visible saccular aneurysm. IMPRESSION: Within limits for assessment on this motion degraded exam, no acute stroke is evident. Mild atrophy and small vessel disease. RIGHT CP angle meningioma, dorsal dural clival thickening, and dorsal intrasellar subcentimeter T1 hyperintense mass lesion, all stable from 2015; multiple contiguous meningiomas would be one unifying hypothesis for this appearance. Alternately, the sellar lesion could be unrelated, possible Rathke's cleft cyst. 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 Diffuse intracranial atherosclerotic disease, likely sequelae of diabetes, without proximal large vessel occlusion or flow-limiting intracranial stenosis. Electronically Signed   By: Staci Righter M.D.   On: 03/24/2016 14:09      TODAY-DAY OF DISCHARGE:  Subjective:   Destiny Beasley today has no headache,no chest abdominal pain,no new weakness tingling or numbness, feels much better wants to go home today.   Objective:   Blood pressure (!) 144/72, pulse 98, temperature 98.7 F (37.1 C), temperature source Oral, resp. rate 17, height 6' (1.829 m), weight 119.7 kg  (263 lb 14.4 oz), SpO2 97 %. No intake or output data in the 24 hours ending 03/25/16 1331 Filed Weights   03/24/16 0716 03/24/16 1900  Weight: 113.4 kg (250 lb) 119.7 kg (263 lb 14.4 oz)    Exam: Awake Alert, Oriented *3, No new F.N deficits, Normal affect [place].AT,PERRAL Supple Neck,No JVD, No cervical lymphadenopathy appriciated.  Symmetrical Chest wall movement, Good air movement bilaterally, CTAB RRR,No Gallops,Rubs or new Murmurs, No Parasternal Heave +ve B.Sounds, Abd Soft, Non tender, No organomegaly appriciated, No rebound -guarding or rigidity. No Cyanosis, Clubbing or edema, No new Rash or bruise   PERTINENT RADIOLOGIC STUDIES: Dg Chest 2 View  Result Date: 03/24/2016 CLINICAL DATA:  60 year old female with chest pain and left upper extremity numbness since last night. Initial encounter. EXAM: CHEST  2 VIEW COMPARISON:  Chest radiographs 09/18/2011. FINDINGS: Stable to mildly lower lung volumes. Mediastinal contours remain normal. Visualized tracheal air column is within normal limits. No pneumothorax, pulmonary edema, pleural effusion or confluent pulmonary opacity. Mild scoliosis. No acute osseous abnormality identified. Negative visible bowel gas pattern. IMPRESSION: No acute cardiopulmonary abnormality. Electronically Signed   By: Genevie Ann M.D.   On: 03/24/2016 08:29   Ct Head Wo Contrast  Result Date: 03/24/2016 CLINICAL DATA:  Left arm weakness.  Left face and arm numbness. EXAM: CT HEAD WITHOUT CONTRAST TECHNIQUE: Contiguous  axial images were obtained from the base of the skull through the vertex without intravenous contrast. COMPARISON:  Brain MRI 11/06/2013 FINDINGS: Brain: No evidence of acute infarction, hemorrhage, hydrocephalus, or mass effect. 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 Previously T1 hyperintense posterior sellar and suprasellar mass has stable appearance. This mass may reflect a proteinaceous cyst. There  changes of previous trans-sphenoidal hypophysectomy. Vascular: Mild atherosclerotic calcification on the carotid siphons. Skull: Remote right retromastoid craniectomy. Sinuses/Orbits: Negative IMPRESSION: 1. No acute finding. 2. 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 No brainstem or cerebellar mass effect to suggest interval enlargement. 3. Stable small sellar and suprasellar mass. Patient is status post trans-sphenoidal pituitary surgery. Electronically Signed   By: Monte Fantasia M.D.   On: 03/24/2016 08:31   Mr Virgel Paling [id number] Contrast  Result Date: 03/24/2016 CLINICAL DATA:  Emergency department patient with stroke-like symptoms. History of pituitary resection 2005. 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 EXAM: MRI HEAD WITHOUT AND WITH CONTRAST MRA HEAD WITHOUT CONTRAST TECHNIQUE: Multiplanar, multiecho pulse sequences of the brain and surrounding structures were obtained without and with intravenous contrast. Angiographic images of the head were obtained using MRA technique without contrast. CONTRAST:  50mL MULTIHANCE GADOBENATE DIMEGLUMINE 529 MG/ML IV SOLN COMPARISON:  MR brain 11/06/2013.  CT head earlier today. FINDINGS: The patient was unable to remain motionless for the exam. Small or subtle lesions could be overlooked. MRI HEAD FINDINGS Brain: No acute stroke, acute hemorrhage, hydrocephalus, or extra-axial fluid. Slight premature for age atrophy. Minor white matter disease, likely chronic microvascular ischemic change given the patient's severe diabetes. In the RIGHT CP angle, redemonstrated is a 7 x 18 x 13 mm en plaque meningioma, anterior to the IAC. Slight mass effect on the brainstem and contiguity with the RIGHT IAC, similar to 2015. This is associated with dorsal dural clival thickening, see for instance image 19 series 17. At the most superior aspect of the dural thickening, there is a T1 hyperintense  posterior intrasellar lesion, roughly spherical, 8 mm diameter which does not display significant postcontrast enhancement. This abuts the posterior chiasm without significant displacement of such. All of these findings appear essentially stable in comparison with 2015 MRI. There is an intrasphenoid fat graft representing previous transsphenoidal approach for removal of pituitary tumor. Within the sella, no features suggestive of recurrent pituitary tumor. Vascular: Flow voids are maintained throughout the carotid, basilar, and vertebral arteries. There are no areas of chronic hemorrhage. Skull and upper cervical spine: Cerebellar tonsils and upper cervical region unremarkable. Normal marrow signal. Sinuses/Orbits: Mild chronic sinus disease.  Negative orbits. Other: None. MRA HEAD FINDINGS The internal carotid arteries are widely patent. The basilar artery is patent but diminutive related to BILATERAL fetal PCA anatomy. RIGHT vertebral is dominant. Moderate irregularity of both the RIGHT and LEFT anterior cerebral arteries in their A1 segments. Suspected 50% stenosis distal M1 segment LEFT MCA. Suspected 50% stenosis proximal M1 segment RIGHT MCA. Moderate irregularity of the distal MCA and PCA branches suggesting intracranial atherosclerotic change. No saccular aneurysm. Motion degradation along with shortened acquisition time makes cerebellar branch evaluation difficult. No visible saccular aneurysm. IMPRESSION: Within limits for assessment on this motion degraded exam, no acute stroke is evident. Mild atrophy and small vessel disease. RIGHT CP angle meningioma, dorsal dural clival thickening, and dorsal intrasellar subcentimeter T1 hyperintense mass lesion, all stable from 2015; multiple contiguous  meningiomas would be one unifying hypothesis for this appearance. Alternately, the sellar lesion could be unrelated, possible Rathke's cleft cyst. Sequelae of trans-sphenoidal resection pituitary adenoma without  obvious recurrence. Diffuse intracranial atherosclerotic disease, likely sequelae of diabetes, without proximal large vessel occlusion or flow-limiting intracranial stenosis. Electronically Signed   By: Staci Righter M.D.   On: 03/24/2016 14:09   Mr Jeri Cos [id number] Contrast  Result Date: 03/24/2016 CLINICAL DATA:  Emergency department patient with stroke-like symptoms. History of pituitary resection 2005. 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 EXAM: MRI HEAD WITHOUT AND WITH CONTRAST MRA HEAD WITHOUT CONTRAST TECHNIQUE: Multiplanar, multiecho pulse sequences of the brain and surrounding structures were obtained without and with intravenous contrast. Angiographic images of the head were obtained using MRA technique without contrast. CONTRAST:  32mL MULTIHANCE GADOBENATE DIMEGLUMINE 529 MG/ML IV SOLN COMPARISON:  MR brain 11/06/2013.  CT head earlier today. FINDINGS: The patient was unable to remain motionless for the exam. Small or subtle lesions could be overlooked. MRI HEAD FINDINGS Brain: No acute stroke, acute hemorrhage, hydrocephalus, or extra-axial fluid. Slight premature for age atrophy. Minor white matter disease, likely chronic microvascular ischemic change given the patient's severe diabetes. In the RIGHT CP angle, redemonstrated is a 7 x 18 x 13 mm en plaque meningioma, anterior to the IAC. Slight mass effect on the brainstem and contiguity with the RIGHT IAC, similar to 2015. This is associated with dorsal dural clival thickening, see for instance image 19 series 17. At the most superior aspect of the dural thickening, there is a T1 hyperintense posterior intrasellar lesion, roughly spherical, 8 mm diameter which does not display significant postcontrast enhancement. This abuts the posterior chiasm without significant displacement of such. All of these findings appear essentially stable in comparison with 2015 MRI. There is an intrasphenoid fat graft  representing previous transsphenoidal approach for removal of pituitary tumor. Within the sella, no features suggestive of recurrent pituitary tumor. Vascular: Flow voids are maintained throughout the carotid, basilar, and vertebral arteries. There are no areas of chronic hemorrhage. Skull and upper cervical spine: Cerebellar tonsils and upper cervical region unremarkable. Normal marrow signal. Sinuses/Orbits: Mild chronic sinus disease.  Negative orbits. Other: None. MRA HEAD FINDINGS The internal carotid arteries are widely patent. The basilar artery is patent but diminutive related to BILATERAL fetal PCA anatomy. RIGHT vertebral is dominant. Moderate irregularity of both the RIGHT and LEFT anterior cerebral arteries in their A1 segments. Suspected 50% stenosis distal M1 segment LEFT MCA. Suspected 50% stenosis proximal M1 segment RIGHT MCA. Moderate irregularity of the distal MCA and PCA branches suggesting intracranial atherosclerotic change. No saccular aneurysm. Motion degradation along with shortened acquisition time makes cerebellar branch evaluation difficult. No visible saccular aneurysm. IMPRESSION: Within limits for assessment on this motion degraded exam, no acute stroke is evident. Mild atrophy and small vessel disease. RIGHT CP angle meningioma, dorsal dural clival thickening, and dorsal intrasellar subcentimeter T1 hyperintense mass lesion, all stable from 2015; multiple contiguous meningiomas would be one unifying hypothesis for this appearance. Alternately, the sellar lesion could be unrelated, possible Rathke's cleft cyst. 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 Diffuse intracranial atherosclerotic disease, likely sequelae of diabetes, without proximal large vessel occlusion or flow-limiting intracranial stenosis. Electronically Signed   By: Staci Righter M.D.   On: 03/24/2016 14:09     PERTINENT LAB RESULTS: CBC:  Recent Labs  03/22/16 1507  03/24/16 0834 03/24/16 0855  WBC 8.3 9.3  --  HGB 13.6 13.7 14.6  HCT 40.8 41.3 43.0  PLT 297 293  --    CMET CMP     Component Value Date/Time   NA 141 03/24/2016 0855   K 4.1 03/24/2016 0855   CL 102 03/24/2016 0855   CO2 27 03/24/2016 0834   GLUCOSE 227 (H) 03/24/2016 0855   BUN 23 (H) 03/24/2016 0855   CREATININE 0.90 03/24/2016 0855   CALCIUM 9.4 03/24/2016 0834   PROT 7.9 03/24/2016 0834   ALBUMIN 4.2 03/24/2016 0834   AST 28 03/24/2016 0834   ALT 35 03/24/2016 0834   ALKPHOS 91 03/24/2016 0834   BILITOT 0.4 03/24/2016 0834   GFRNONAA >60 03/24/2016 0834   GFRAA >60 03/24/2016 0834    GFR Estimated Creatinine Clearance: 98.6 mL/min (by C-G formula based on SCr of 0.9 mg/dL).  Recent Labs  03/24/16 0834  LIPASE 32    Recent Labs  03/24/16 1500  TROPONINI <0.03   Invalid input(s): POCBNP No results for input(s): DDIMER in the last 72 hours.  Recent Labs  03/22/16 1508  HGBA1C 9.1*    Recent Labs  03/25/16 0921  CHOL 118  HDL 39*  LDLCALC 61  TRIG 88  CHOLHDL 3.0    Recent Labs  03/24/16 0834  TSH 1.754   No results for input(s): VITAMINB12, FOLATE, FERRITIN, TIBC, IRON, RETICCTPCT in the last 72 hours. Coags:  Recent Labs  03/24/16 0834  INR 0.94   Microbiology: Recent Results (from the past 240 hour(s))  Urine culture     Status: None (Preliminary result)   Collection Time: 03/24/16  8:38 AM  Result Value Ref Range Status   Specimen Description URINE, CLEAN CATCH  Final   Special Requests Normal  Final   Culture   Final    CULTURE REINCUBATED FOR BETTER GROWTH Performed at [facility]    Report Status PENDING  Incomplete    FURTHER DISCHARGE INSTRUCTIONS:  Get Medicines reviewed and adjusted: Please take all your medications with you for your next visit with your Primary MD  Laboratory/radiological data: Please request your Primary MD to go over all hospital tests and procedure/radiological results at the  follow up, please ask your Primary MD to get all Hospital records sent to his/her office.  In some cases, they will be blood work, cultures and biopsy results pending at the time of your discharge. Please request that your primary care M.D. goes through all the records of your hospital data and follows up on these results.  Also Note the following: If you experience worsening of your admission symptoms, develop shortness of breath, life threatening emergency, suicidal or homicidal thoughts you must seek medical attention immediately by calling 911 or calling your MD immediately  if symptoms less severe.  You must read complete instructions/literature along with all the possible adverse reactions/side effects for all the Medicines you take and that have been prescribed to you. Take any new Medicines after you have completely understood and accpet all the possible adverse reactions/side effects.   Do not drive when taking Pain medications or sleeping medications (Benzodaizepines)  Do not take more than prescribed Pain, Sleep and Anxiety Medications. It is not advisable to combine anxiety,sleep and pain medications without talking with your primary care practitioner  Special Instructions: If you have smoked or chewed Tobacco  in the last 2 yrs please stop smoking, stop any regular Alcohol  and or any Recreational drug use.  Wear Seat belts while driving.  Please note: You  were cared for by a hospitalist during your hospital stay. Once you are discharged, your primary care physician will handle any further medical issues. Please note that NO REFILLS for any discharge medications will be authorized once you are discharged, as it is imperative that you return to your primary care physician (or establish a relationship with a primary care physician if you do not have one) for your post hospital discharge needs so that they can reassess your need for medications and monitor your lab values.  Total Time  spent coordinating discharge including counseling, education and face to face time equals  45 minutes.  SignedOren Binet 03/25/2016 1:31 PM

## 2016-03-25 NOTE — Progress Notes (Signed)
OT Cancellation Note  Patient Details Name: Destiny Beasley MRN: [id number] DOB: 01-05-1956   Cancelled Treatment:    Reason Eval/Treat Not Completed: Spoke with pt. OT screened, no needs identified, will sign off  [facility], OTR/L  [id number] 03/25/2016 03/25/2016, 9:34 AM

## 2016-03-25 NOTE — Progress Notes (Addendum)
STROKE TEAM PROGRESS NOTE   HISTORY OF PRESENT ILLNESS (per record) Destiny Beasley is an 60 y.o. female history of hypertension, hyperlipidemia, diabetes mellitus and breast cancer who experienced an episode of numbness involving left face arm and leg as well as weakness of left arm on 03/23/2016. Subsequently resolved. She had no facial droop and no speech changes. There is no previous history of stroke nor TIA. She has not been on antiplatelet therapy. CT scan of her head showed no acute intracranial abnormality. MRI showed no signs of an acute stroke. Right CP angle meningioma, dorsal dural clival thickening, and dorsal intrasellar T1 hyperintense mass lesion are unchanged from 2015. MRA showed no significant major vessel occlusion or significant stenosis intracranially. Carotid Doppler and echocardiogram results pending. NIH stroke score is time of this evaluation was 0.  LSN: 6:00 PM on 03/23/2016 tPA Given: No: Deficits resolved mRankin:   SUBJECTIVE (INTERVAL HISTORY) Her family was not at the bedside.  Overall she feels her condition is gradually worsening. There is still numbness on the left side.  We discussed her recent diagnosis of breast cancer and the possibility of increased hypercoagulability.  Her lumpectomy is scheduled for Tuesday   OBJECTIVE Temp:  [97.7 F (36.5 C)-98.8 F (37.1 C)] 98.7 F (37.1 C) (12/02 1028) Pulse Rate:  [76-98] 98 (12/02 1028) Cardiac Rhythm: Heart block (12/01 2016) Resp:  [9-18] 17 (12/02 1028) BP: (122-149)/(63-85) 144/72 (12/02 1028) SpO2:  [96 %-100 %] 97 % (12/02 1028) Weight:  [119.7 kg (263 lb 14.4 oz)] 119.7 kg (263 lb 14.4 oz) (12/01 1900)  CBC:   Recent Labs Lab 03/22/16 1507 03/24/16 0834 03/24/16 0855  WBC 8.3 9.3  --   NEUTROABS 3.7 4.5  --   HGB 13.6 13.7 14.6  HCT 40.8 41.3 43.0  MCV 88.5 88.1  --   PLT 297 293  --     Basic Metabolic Panel:   Recent Labs Lab 03/22/16 1507 03/24/16 0834 03/24/16 0855  NA 135  139 141  K 4.1 3.9 4.1  CL 97* 103 102  CO2 30 27  --   GLUCOSE 339* 231* 227*  BUN 14 21* 23*  CREATININE 1.03* 0.98 0.90  CALCIUM 9.5 9.4  --     Lipid Panel:     Component Value Date/Time   CHOL 118 03/25/2016 0921   TRIG 88 03/25/2016 0921   HDL 39 (L) 03/25/2016 0921   CHOLHDL 3.0 03/25/2016 0921   VLDL 18 03/25/2016 0921   LDLCALC 61 03/25/2016 0921   HgbA1c:  Lab Results  Component Value Date   HGBA1C 9.1 (H) 03/22/2016   Urine Drug Screen:     Component Value Date/Time   LABOPIA POSITIVE (A) 03/24/2016 0838   COCAINSCRNUR NONE DETECTED 03/24/2016 0838   LABBENZ NONE DETECTED 03/24/2016 0838   AMPHETMU NONE DETECTED 03/24/2016 0838   THCU NONE DETECTED 03/24/2016 0838   LABBARB NONE DETECTED 03/24/2016 0838      IMAGING  Dg Chest 2 View 03/24/2016 No acute cardiopulmonary abnormality.    Ct Head Wo Contrast 03/24/2016 1. No acute finding.  2. 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 No brainstem or cerebellar mass effect to suggest interval enlargement.  3. Stable small sellar and suprasellar mass. Patient is status post trans-sphenoidal pituitary surgery.    Mr Virgel Paling Wo Contrast 03/24/2016 Within limits for assessment on this motion degraded exam, no acute stroke is evident. Mild atrophy and small vessel disease.  RIGHT CP angle meningioma, dorsal dural clival thickening, and dorsal intrasellar subcentimeter T1 hyperintense mass lesion, all stable from 2015; multiple contiguous meningiomas would be one unifying hypothesis for this appearance. Alternately, the sellar lesion could be unrelated, possible Rathke's cleft cyst. 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 Diffuse intracranial atherosclerotic disease, likely sequelae of diabetes, without proximal large vessel occlusion or flow-limiting intracranial stenosis.    PHYSICAL EXAM HEENT-  Normocephalic, no lesions, without  obvious abnormality.  Normal external eye and conjunctiva.  Normal TM's bilaterally.  Normal auditory canals and external ears. Normal external nose, mucus membranes and septum.  Normal pharynx. Neck supple with no masses, nodes, nodules or enlargement. Cardiovascular - regular rate and rhythm, S1, S2 normal, no murmur, click, rub or gallop Lungs - chest clear, no wheezing, rales, normal symmetric air entry Abdomen - soft, non-tender; bowel sounds normal; no masses,  no organomegaly Extremities - no joint deformities, effusion, or inflammation and no edema  Neurologic Examination: Mental Status: Alert, oriented, thought content appropriate.  Speech fluent without evidence of aphasia. Able to follow commands without difficulty. Cranial Nerves: II-Visual fields were normal. III/IV/VI-Pupils were equal and reacted normally to light. Extraocular movements were full and conjugate.    V/VII-no facial numbness and no facial weakness. VIII-normal. X-normal speech and symmetrical palatal movement. XI: trapezius strength/neck flexion strength normal bilaterally XII-midline tongue extension with normal strength. Motor: 5/5 bilaterally with normal tone and bulk Sensory: slight decrease to sensation in the left face, arm and leg Deep Tendon Reflexes: 1+ and symmetric. Plantars: Mute bilaterally Cerebellar: Normal finger-to-nose testing. Carotid auscultation: Normal   ASSESSMENT/PLAN Ms. Destiny Beasley is a 60 y.o. female with history of hypertension, hyperlipidemia, OSA, anxiety, diabetes mellitus and breast cancer presenting with an episode of numbness involving left face arm and leg as well as weakness of left arm. She did not receive IV t-PA due to resolution of deficits.  Possible TIA:  Non-dominant   Resultant - resolution of deficits.  MRI - no acute stroke is evident. Meningioma / mass - stable from 2015.  MRA - diffuse intracranial atherosclerotic disease without proximal large vessel  occlusion or flow-limiting intracranial stenosis.   Carotid Doppler - unremarkable  2D Echo - EF 60-65%. No cardiac source of emboli identified.  LDL - 61  HgbA1c - 9.1  VTE prophylaxis - Lovenox Diet heart healthy/carb modified Room service appropriate? Yes; Fluid consistency: Thin Diet - low sodium heart healthy  No antithrombotic prior to admission, now on aspirin 325 mg daily  Patient counseled to be compliant with her antithrombotic medications  Ongoing aggressive stroke risk factor management  Therapy recommendations: No needs identified.  Disposition: The plan is for discharge today  Hypertension  Blood pressure mildly high  Permissive hypertension (OK if < 220/120) but gradually normalize in 5-7 days  Long-term BP goal normotensive  Hyperlipidemia  Home meds: Crestor 20 mg daily resumed in hospital  LDL 61, goal < 70  Continue statin at discharge  Diabetes  HgbA1c 9.1, goal < 7.0  Uncontrolled  Other Stroke Risk Factors  ETOH use, advised to drink no more than 1 drink per day  Obesity, Body mass index is 35.79 kg/m., recommend weight loss, diet and exercise as appropriate   Obstructive sleep apnea  Other Active Problems  Chest pain - cardiology consult - atypical pain. Not felt to be related to ischemia.  Mildly elevated BUN - 23   PLAN  Follow-up with Dr. Candyce Churn day #  0    To contact Stroke Continuity provider, please refer to [email or web address] After hours, contact General Neurology

## 2016-03-25 NOTE — Progress Notes (Signed)
  Echocardiogram 2D Echocardiogram has been performed.  Bobbye Charleston 03/25/2016, 12:10 PM

## 2016-03-25 NOTE — Progress Notes (Signed)
Seen and examined No focal deficits Requesting discharge Spoke with Stroke team-Dr Gregory-ok to d/c after work up complete on ASA See d/c summary for details.

## 2016-03-28 ENCOUNTER — Ambulatory Visit (HOSPITAL_BASED_OUTPATIENT_CLINIC_OR_DEPARTMENT_OTHER)
Admission: RE | Admit: 2016-03-28 | Payer: Managed Care, Other (non HMO) | Source: Ambulatory Visit | Admitting: General Surgery

## 2016-03-28 SURGERY — BREAST LUMPECTOMY WITH RADIOACTIVE SEED LOCALIZATION
Anesthesia: General | Site: Breast | Laterality: Right

## 2016-03-29 ENCOUNTER — Telehealth: Payer: Self-pay | Admitting: Neurology

## 2016-03-29 NOTE — Telephone Encounter (Signed)
This patient has not seen me or Dr. Erlinda Hong. Mitchell Heir gave her work in appointment with either one of [place] or if not available with Dr Jaynee Eagles or [facility] for urgent clearance for her cancer surgery

## 2016-03-29 NOTE — Telephone Encounter (Signed)
This pt said she was discharged by Dr Leonie Man on 03/25/16 and he advised her she was ok to have breast cancer surgery. She said the surgery has been postponed until a letter is rec'd from Dr Leonie Man to Dr Fanny Skates ([phone] stating she is cleared for surgery.  Please fax it

## 2016-03-30 ENCOUNTER — Telehealth: Payer: Self-pay | Admitting: Hematology and Oncology

## 2016-03-30 NOTE — Telephone Encounter (Signed)
Dr. Carlis Stable thanks.

## 2016-03-30 NOTE — Telephone Encounter (Signed)
I have 2 openings on Monday full the rest of the week

## 2016-03-30 NOTE — Telephone Encounter (Signed)
I will be in clinic that day and I can help Destiny Beasley if she needs me. Thanks.   Rosalin Hawking, MD PhD Stroke Neurology 03/30/2016 3:07 PM

## 2016-03-30 NOTE — Telephone Encounter (Signed)
Pt called back today.  First available is with Dr Jaynee Eagles on 12/21, pt request appt for next week. Please call with appt time. Thank you

## 2016-03-30 NOTE — Telephone Encounter (Signed)
12/12 Appointment canceled per patient request. Per the patient has not had surgery yet will call back to reschedule following her surgery.

## 2016-03-30 NOTE — Telephone Encounter (Signed)
RN spoke with Megan(NP), and Dr.Sethi about patient needing clearance for surgery. Pt was never seen by Dr .Jaynee Eagles, Dr.Xu or Dr. Leonie Man in the hospital. Pt has breast cancer and needs surgery this month. Pt had it scheduled but it was cancel due to not having clearance. Dr .Jaynee Eagles, [place], and Dr.Xu schedule if full next week. Pt was offer 04/13/2016 but wanted to be seen sooner. Pt will see MEGAN(NP) for the stroke follow up. Dr Erlinda Hong will be advised about any follow up questions about the letter, and will be ask to assist the NP on the appt If necessary. Rn call patient to schedule her for 04/04/2016.

## 2016-03-30 NOTE — Telephone Encounter (Signed)
PER Dr.Sethi because its breast cancer pt can have the surgery but needs to know the risk because of the stroke. Dr. Erlinda Hong will be advised of this note.

## 2016-04-03 ENCOUNTER — Inpatient Hospital Stay: Admission: RE | Admit: 2016-04-03 | Payer: Self-pay | Source: Ambulatory Visit

## 2016-04-04 ENCOUNTER — Ambulatory Visit (INDEPENDENT_AMBULATORY_CARE_PROVIDER_SITE_OTHER): Payer: Managed Care, Other (non HMO) | Admitting: Adult Health

## 2016-04-04 ENCOUNTER — Encounter: Payer: Self-pay | Admitting: Adult Health

## 2016-04-04 ENCOUNTER — Ambulatory Visit: Payer: Self-pay | Admitting: Hematology and Oncology

## 2016-04-04 VITALS — Ht 72.0 in

## 2016-04-04 DIAGNOSIS — E119 Type 2 diabetes mellitus without complications: Secondary | ICD-10-CM | POA: Diagnosis not present

## 2016-04-04 DIAGNOSIS — Z794 Long term (current) use of insulin: Secondary | ICD-10-CM | POA: Diagnosis not present

## 2016-04-04 DIAGNOSIS — Z8673 Personal history of transient ischemic attack (TIA), and cerebral infarction without residual deficits: Secondary | ICD-10-CM

## 2016-04-04 DIAGNOSIS — C50919 Malignant neoplasm of unspecified site of unspecified female breast: Secondary | ICD-10-CM

## 2016-04-04 NOTE — Patient Instructions (Addendum)
Continue Asprin  Maintain good control of Blood pressure with goal <130/90 Cholesterol LDL <70 HgbA1c <6.5 %  Ok to proceed with surgery- Ideally after a TIA/Stroke event we recommend waiting >6 months for elective surgery however in your case the risks associated with not having surgery are great. I will provide a letter for your surgeon.

## 2016-04-04 NOTE — Progress Notes (Signed)
PATIENT: Destiny Beasley DOB: December 22, 1955  REASON FOR VISIT: follow up- TIA HISTORY FROM: patient  HISTORY OF PRESENT ILLNESS: Destiny Beasley is a 60 year old female with a history of TIA hypertension, dyslipidemia, diabetes and stage IV breast cancer. The patient presented on 03/24/2016 to the ED with numbness involving the left face arm and leg. She also had weakness of the left arm. Her symptoms spontaneously resolved therefore she did not receive TPA. MRI did not show any acute stroke. MRA also did not show any significant major vessel occlusion or significant stenosis intracranially. Carotid Dopplers were unremarkable the patient's echocardiogram showed left ventricle wall thickness was increased in a pattern of moderate LVH- mild concentric hypertrophy-Ejection fraction was [id number]- Diastolic dysfunction grade indeterminate. The patient is diabetic with hemoglobin A1c of 9.1. She is currently on Victoza and insulin. She takes Crestor for her cholesterol. She is on hydrochlorothiazide for her blood pressure. She was discharged on aspirin. Patient reports that since her discharge she has not had any additional strokelike symptoms. The day if her surgery she was scheduled to have radiation seed implanted. Her surgeon will now light clearance from our office before they proceed. Patient denies any new neurological symptoms. She returns today for an evaluation.  REVIEW OF SYSTEMS: Out of a complete 14 system review of symptoms, the patient complains only of the following symptoms, and all other reviewed systems are negative.  Snoring, memory loss, excessive sweating  ALLERGIES: Allergies  Allergen Reactions  . Invokana [Canagliflozin] Other (See Comments)    Constant yeast infections    HOME MEDICATIONS: Outpatient Medications Prior to Visit  Medication Sig Dispense Refill  . APIDRA SOLOSTAR 100 UNIT/ML Solostar Pen INEJCT 15 UNITS INTO THE SKIN IN THE MORNING, 15 UNITS AT LUNCH AND 20 UNITS  AT DINNER (Patient taking differently: INEJCT 15-20 subcutaneously units three times daily, inject 15 UNITS INTO THE SKIN IN THE MORNING, 15 UNITS AT LUNCH AND 20 UNITS AT DINNER) 15 pen 3  . aspirin EC 325 MG EC tablet Take 1 tablet (325 mg total) by mouth daily. 30 tablet 0  . B-D UF III MINI PEN NEEDLES 31G X 5 MM MISC USE 4 DAILY 200 each 3  . Biotin 10000 MCG TABS Take by mouth.    . Cholecalciferol (VITAMIN D-3 PO) Take by mouth.    . Cyanocobalamin (VITAMIN B-12) 2500 MCG SUBL Place under the tongue.    . DULERA 100-5 MCG/ACT AERO Inhale 2 puffs into the lungs 2 (two) times daily as needed for wheezing or shortness of breath (allergies).     Marland Kitchen glucose blood (ONETOUCH VERIO) test strip USE AS DIRECTED TO CHECK BLOOD SUGARS THREE TIMES DAILY Dx code E11.65 150 each 3  . hydrochlorothiazide (HYDRODIURIL) 25 MG tablet Take 25 mg by mouth daily.    . Insulin Glargine (TOUJEO SOLOSTAR) 300 UNIT/ML SOPN Inject 70 Units into the skin daily. (Patient taking differently: Inject 45 Units into the skin daily. ) 8 pen 0  . Liraglutide (VICTOZA) 18 MG/3ML SOPN Inject 1.8 mg into the skin daily. Around 1500-1600pm    . montelukast (SINGULAIR) 10 MG tablet Take 10 mg by mouth at bedtime.    . Multiple Vitamin (MULITIVITAMIN WITH MINERALS) TABS Take 1 tablet by mouth daily.      Marland Kitchen olmesartan (BENICAR) 40 MG tablet Take 40 mg by mouth daily.    . pantoprazole (PROTONIX) 40 MG tablet Take 40 mg by mouth daily.     . rosuvastatin (CRESTOR) 20  MG tablet Take 20 mg by mouth daily.    . valACYclovir (VALTREX) 500 MG tablet Take 1,000 mg by mouth daily as needed (flareups/outbreaks).    . venlafaxine (EFFEXOR-XR) 150 MG 24 hr capsule Take 150 mg by mouth daily.      Marland Kitchen ibuprofen (ADVIL,MOTRIN) 800 MG tablet Take 800-1,600 mg by mouth every 8 (eight) hours as needed for headache or moderate pain.    Marland Kitchen oxyCODONE-acetaminophen (PERCOCET) 10-325 MG tablet Take 1 tablet by mouth every 6 (six) hours as needed for pain.      No facility-administered medications prior to visit.     PAST MEDICAL HISTORY: Past Medical History:  Diagnosis Date  . Anxiety   . Diabetes mellitus   . Hypercholesteremia   . Hypertension   . Malignant neoplasm of upper-outer quadrant of right female breast ([place]) 02/24/2016  . Sleep apnea     PAST SURGICAL HISTORY: Past Surgical History:  Procedure Laterality Date  . ABDOMINAL HYSTERECTOMY    . BRAIN MENINGIOMA EXCISION  2005  . CESAREAN SECTION    . FOOT SURGERY Bilateral 2016   hammer toe surgery  . MENISCUS REPAIR Left 2015  . PITUITARY SURGERY    . THYROID SURGERY      FAMILY HISTORY: Family History  Problem Relation Age of Onset  . Diabetes Mother   . Hyperlipidemia Mother   . Hypertension Mother     SOCIAL HISTORY: Social History   Social History  . Marital status: Single    Spouse name: N/A  . Number of children: N/A  . Years of education: N/A   Occupational History  . Not on file.   Social History Main Topics  . Smoking status: Never Smoker  . Smokeless tobacco: Never Used  . Alcohol use 0.6 oz/week    1 Glasses of wine per week     Comment: 1 glass monthly  . Drug use: No  . Sexual activity: Yes    Birth control/ protection: Other-see comments     Comment: no cycles; pt had hyst   Other Topics Concern  . Not on file   Social History Narrative  . No narrative on file      PHYSICAL EXAM  Vitals:   04/04/16 1014  Height: 6' (1.829 m)   There is no height or weight on file to calculate BMI.  Generalized: Well developed, in no acute distress   Neurological examination  Mentation: Alert oriented to time, place, history taking. Follows all commands speech and language fluent Cranial nerve II-XII: Pupils were equal round reactive to light. Extraocular movements were full, visual field were full on confrontational test. Facial sensation and strength were normal. Uvula tongue midline. Head turning and shoulder shrug  were normal and  symmetric. Motor: The motor testing reveals 5 over 5 strength of all 4 extremities. Good symmetric motor tone is noted throughout.  Sensory: Sensory testing is intact to soft touch on all 4 extremities. No evidence of extinction is noted.  Coordination: Cerebellar testing reveals good finger-nose-finger and heel-to-shin bilaterally.  Gait and station: Gait is normal. Tandem gait is normal. Romberg is negative. No drift is seen.  Reflexes: Deep tendon reflexes are symmetric and normal bilaterally.   DIAGNOSTIC DATA (LABS, IMAGING, TESTING) - I reviewed patient records, labs, notes, testing and imaging myself where available.  Lab Results  Component Value Date   WBC 9.3 03/24/2016   HGB 14.6 03/24/2016   HCT 43.0 03/24/2016   MCV 88.1 03/24/2016   PLT  293 03/24/2016      Component Value Date/Time   NA 141 03/24/2016 0855   K 4.1 03/24/2016 0855   CL 102 03/24/2016 0855   CO2 27 03/24/2016 0834   GLUCOSE 227 (H) 03/24/2016 0855   BUN 23 (H) 03/24/2016 0855   CREATININE 0.90 03/24/2016 0855   CALCIUM 9.4 03/24/2016 0834   PROT 7.9 03/24/2016 0834   ALBUMIN 4.2 03/24/2016 0834   AST 28 03/24/2016 0834   ALT 35 03/24/2016 0834   ALKPHOS 91 03/24/2016 0834   BILITOT 0.4 03/24/2016 0834   GFRNONAA >60 03/24/2016 0834   GFRAA >60 03/24/2016 0834   Lab Results  Component Value Date   CHOL 118 03/25/2016   HDL 39 (L) 03/25/2016   LDLCALC 61 03/25/2016   TRIG 88 03/25/2016   CHOLHDL 3.0 03/25/2016   Lab Results  Component Value Date   HGBA1C 9.1 (H) 03/22/2016   No results found for: [id number] Lab Results  Component Value Date   TSH 1.754 03/24/2016      ASSESSMENT AND PLAN 61 y.o. year old female  has a past medical history of Anxiety; Diabetes mellitus; Hypercholesteremia; Hypertension; Malignant neoplasm of upper-outer quadrant of right female breast ([place]) (02/24/2016); and Sleep apnea. here with:  1. History of TIA 2. Diabetes 3. Stage IV breast cancer  The  patient is encouraged to continue on aspirin for stroke prevention. She should maintain strict control of her blood pressure goal less than 130/90. Cholesterol LDL less than 70 and hemoglobin A1c less than 6.5%. The patient is encouraged to monitor her diet and begin exercising. I did explain to the patient that ideally we recommend waiting at least 6 months before undergoing elective surgery. However in her case the risks associated with postponing surgery for stage IV breast cancer are great. I did discuss this with Dr. Erlinda Hong. The patient is aware that she could have a recurrent TIA or stroke. The patient continue on aspirin. This does not need to be held prior to surgery. Patient advised that if she has any strokelike symptoms she should go to the emergency room. She will follow-up in 3-4 months or sooner if needed.     Ward Givens, MSN, NP-C 04/04/2016, 1:30 PM [facility] [address] [place], [place] [address] [phone]

## 2016-04-05 NOTE — Telephone Encounter (Signed)
Rn saw Megan(NP) with Dr .Erlinda Hong assistance. Patient needs neuro clearance for surgery. Rn call Dr. Fanny Skates Md office at [phone]. Rn got fax number of [phone]. Rn fax clearance letter twice and it was confirmed.

## 2016-04-05 NOTE — Progress Notes (Signed)
I reviewed above note and agree with the assessment and plan. She should continue ASA and crestor for stroke prevention and aggressive control of stroke risk factors. Due to the urgency of procedure, Ok to proceed but pt has to be aware that it carries a small but acceptable peri-operative risk for TIA/stroke. Recommend not to stop ASA perioperatively.   Rosalin Hawking, MD PhD Stroke Neurology 04/05/2016 10:38 AM

## 2016-04-08 ENCOUNTER — Ambulatory Visit
Admission: RE | Admit: 2016-04-08 | Discharge: 2016-04-08 | Disposition: A | Discharge status: Home / Self Care | Payer: Managed Care, Other (non HMO) | Source: Ambulatory Visit | Attending: Neurosurgery | Admitting: Neurosurgery

## 2016-04-08 DIAGNOSIS — D320 Benign neoplasm of cerebral meninges: Secondary | ICD-10-CM

## 2016-04-08 MED ORDER — GADOBENATE DIMEGLUMINE 529 MG/ML IV SOLN
20.0000 mL | Freq: Once | INTRAVENOUS | Status: AC | PRN
  Administered 2016-04-08: 20 mL via INTRAVENOUS

## 2016-04-11 ENCOUNTER — Ambulatory Visit: Payer: Managed Care, Other (non HMO)

## 2016-04-11 ENCOUNTER — Ambulatory Visit: Payer: Managed Care, Other (non HMO) | Admitting: Radiation Oncology

## 2016-04-12 ENCOUNTER — Encounter (HOSPITAL_BASED_OUTPATIENT_CLINIC_OR_DEPARTMENT_OTHER): Payer: Self-pay | Admitting: *Deleted

## 2016-04-12 NOTE — Progress Notes (Signed)
Bring medications, CPAP machine day of surgery.To pick up water drink 04/18/2016.

## 2016-04-16 NOTE — H&P (Signed)
Destiny Beasley. Cletus Gash Location: [facility] Patient #: [id number] DOB: 1956-03-14 Widowed / Language: English / Race: Black or African American Female        History of Present Illness  The patient is a 60 year old female who presents with breast cancer. This is a 60 year old African-American female. She is referred by Dr. Jetta Lout at the [facility] for evaluation and management of high-grade DCIS right breast. Dr. Serina Cowper is her PCP. Dr. Elayne Snare is her endocrinologist. Dr. Dayton Bailiff is her neurosurgeon. Her sister and her daughter are with her throughout the encounter.      She has no prior history of breast problems. She says this also calcifications in the right breast 2 years ago but she hasn't had a mammogram in 2 years. She is asymptomatic. Recent screening mammograms and then subsequent ultrasound shows category B density, 9 mm area of calcifications right breast, 9:30 position, 2 cm from the nipple. 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 ER 80%, PR 20%. She was discussed at breast conference this morning about to be a good candidate for lumpectomy.      Comorbidities include insulin-dependent diabetes mellitus. She takes long-acting insulin in the morning and a fast acting insulin 3 times a day. Dr. Dwyane Dee manages all of her endocrine replacement therapy She is had a pituitary adenoma resected several years ago somewhere out of town 2005. 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 She is followed by [place] Bailiff who gets MRI from time to time.  Recent problems with headaches and balance were reported and she is scheduled for MRI brain next week. We'll need to check that prior to general anesthesia.  She's had some type of thyroid surgery but is not on any thyroid hormone. She's had a hysterectomy and it looks like this was abdominal through a  Pfannenstiel incision. She says her ovaries were not removed. She has hypertension. Anxiety. Hyperlipidemia. BMI 35.  She takes estrogen hormone replacement therapy, and I stressed that she discontinue that immediately.         Family history reveals that some remote relatives either secondary third cousins have breast cancer but there might have been 3 of them. No close relatives. No ovarian cancer. No colon cancer. Social history reveals that she is a widow. Has 2 daughters. Lives alone. Is independent. Drink alcohol occasionally. Denies tobacco. 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       We had a long talk about her cancer. I told her that she would need some type of surgery since this was high-grade DCIS. We discussed the options of mastectomy versus lumpectomy. She does not need any lymph node biopsy. I told her my bias was lumpectomy and she agrees. She will be scheduled for right breast lumpectomy with radioactive seed localization. I discussed the indications, details, techniques, and numerous risk of the surgery with her and her family members. She is aware the risks of bleeding, infection, cosmetic deformity, chronic pain or numbness, and sensitivity of the nipple, flattening of the nipple, reoperation for positive margins or if we find invasive disease. She understands all of these issues. All of her questions were answered. She agrees with this plan. We will ask Dr. Dwyane Dee to adjust her insulin management in the perioperative period. This will be a outpatient surgery so alteration of her insulin therapy will be transient. I told her that  we should wait until late November so that the MRI brain can be done in Dr. Cyndy Freeze can advise as to whether it is safe for her to undergo general anesthesia. She has requested that she see Dr. Lindi Adie and Dr. Isidore Moos in the cancer center, and those arrangements are being made. I told her that she would likely  be offered whole breast radiation therapy and antiestrogen therapy postop. She knows that there is decisions will come later.      Recently hospitalized for TIA.  See neurology outpatient risk assessment for proposed lumpectomy.    Other Problems  Anxiety Disorder  Arthritis  Asthma  Breast Cancer  Diabetes Mellitus  Gastroesophageal Reflux Disease  High blood pressure  Hypercholesterolemia  Lump In Breast  Sleep Apnea  Thyroid Disease  Umbilical Hernia Repair   Past Surgical History  Breast Biopsy  Right. Cesarean Section - 1  Foot Surgery  Bilateral. Hysterectomy (not due to cancer) - Partial  Knee Surgery  Left. Oral Surgery  Thyroid Surgery  Ventral / Umbilical Hernia Surgery  Bilateral.  Diagnostic Studies History  Colonoscopy  1-5 years ago Mammogram  within last year Pap Smear  1-5 years ago  Allergies Invokana *ANTIDIABETICS*   Medication History  AmLODIPine Besylate (10MG  Tablet, Oral) Active. Apidra SoloStar (100UNIT/ML Solution, Injection) Active. Biotin (10000MCG Tablet, Oral) Active. Cholecalciferol (Oral) Specific dose unknown - Active. Cyanocobalamin (2500MCG Tablet, Oral) Active. Dulaglutide (1.5MG /0.5ML Soln Pen-inj, Subcutaneous) Active. Dulera (100-5MCG/ACT Aerosol, Inhalation) Active. Estrogens Conj Synthetic A (0.625MG  Tablet, Oral) Active. HydroCHLOROthiazide (25MG  Tablet, Oral) Active. Ibuprofen (600MG  Tablet, Oral) Active. Insulin Glargine (300UNIT/ML Soln Pen-inj, Subcutaneous) Active. Insulin Lispro (100UNIT/ML Soln Pen-inj, Subcutaneous) Active. Liraglutide (18MG /3ML Solution, Subcutaneous) Active. Lorcaserin HCl ER (20MG  Tablet ER 24HR, Oral) Active. Meloxicam (7.5MG  Tablet, Oral) Active. Montelukast Sodium (10MG  Tablet, Oral) Active. Multiple Vitamin (Oral) Active. Olmesartan Medoxomil (20MG  Tablet, Oral) Active. Pantoprazole Sodium (40MG  Packet, Oral) Active. Rosuvastatin Calcium  (20MG  Tablet, Oral) Active. Toujeo SoloStar (300UNIT/ML Soln Pen-inj, Subcutaneous) Active. ValACYclovir HCl (Oral) Specific dose unknown - Active. Venlafaxine HCl (150MG  Capsule ER, Oral) Active. Victoza (18MG /3ML Solution, Subcutaneous) Active. Medications Reconciled  Social History  Alcohol use  Occasional alcohol use. Caffeine use  Coffee. No drug use  Tobacco use  Former smoker.  Family History  Alcohol Abuse  Brother. Cancer  Family Members In General. Diabetes Mellitus  Brother, Daughter, Family Members In [place], Mother. Heart Disease  Mother. Hypertension  Mother. Seizure disorder  Brother.  Pregnancy / Birth History  Age at menarche  56 years. Age of menopause  53-55 Contraceptive History  Intrauterine device, Oral contraceptives. Gravida  2 Irregular periods  Length (months) of breastfeeding  3-6 Maternal age  66-20 Para  2    Review of Systems  General Not Present- Appetite Loss, Chills, Fatigue, Fever, Night Sweats, Weight Gain and Weight Loss. Skin Not Present- Change in Wart/Mole, Dryness, Hives, Jaundice, New Lesions, Non-Healing Wounds, Rash and Ulcer. HEENT Present- Seasonal Allergies, Sinus Pain and Wears glasses/contact lenses. Not Present- Earache, Hearing Loss, Hoarseness, Nose Bleed, Oral Ulcers, Ringing in the Ears, Sore Throat, Visual Disturbances and Yellow Eyes. Respiratory Present- Snoring and Wheezing. Not Present- Bloody sputum, Chronic Cough and Difficulty Breathing. Breast Present- Breast Mass and Nipple Discharge. Not Present- Breast Pain and Skin Changes. Cardiovascular Not Present- Chest Pain, Difficulty Breathing Lying Down, Leg Cramps, Palpitations, Rapid Heart Rate, Shortness of Breath and Swelling of Extremities. Gastrointestinal Not Present- Abdominal Pain, Bloating, Bloody Stool, Change in Bowel Habits, Chronic diarrhea, Constipation, Difficulty Swallowing, Excessive  gas, Gets full quickly at meals, Hemorrhoids,  Indigestion, Nausea, Rectal Pain and Vomiting. Female Genitourinary Not Present- Frequency, Nocturia, Painful Urination, Pelvic Pain and Urgency. Musculoskeletal Present- Muscle Pain and Muscle Weakness. Not Present- Back Pain, Joint Pain, Joint Stiffness and Swelling of Extremities. Neurological Present- Decreased Memory. Not Present- Fainting, Headaches, Numbness, Seizures, Tingling, Tremor, Trouble walking and Weakness. Psychiatric Present- Anxiety. Not Present- Bipolar, Change in Sleep Pattern, Depression, Fearful and Frequent crying. Endocrine Present- Hot flashes. Not Present- Cold Intolerance, Excessive Hunger, Hair Changes, Heat Intolerance and New Diabetes. Hematology Not Present- Blood Thinners, Easy Bruising, Excessive bleeding, Gland problems, HIV and Persistent Infections.  Vitals  Weight: 264.5 lb Height: 72in Height was reported by patient. Body Surface Area: 2.4 m Body Mass Index: 35.87 kg/m  Temp.: 98.97F(Oral)  Pulse: 94 (Regular)  P.OX: 98% (Room air) BP: 160/90 (Sitting, Left Arm, Standard)   Physical Exam  General Mental Status-Alert. General Appearance-Consistent with stated age. Hydration-Well hydrated. Voice-Normal. Note: Very pleasant. Family very supportive. Mild memory problems, admittedly. BMI 35.   Head and Neck Head-normocephalic, atraumatic with no lesions or palpable masses. Trachea-midline. Thyroid Gland Characteristics - normal size and consistency.  Eye Eyeball - Bilateral-Extraocular movements intact. Sclera/Conjunctiva - Bilateral-No scleral icterus.  Chest and Lung Exam Chest and lung exam reveals -quiet, even and easy respiratory effort with no use of accessory muscles and on auscultation, normal breath sounds, no adventitious sounds and normal vocal resonance. Inspection Chest Wall - Normal. Back - normal.  Breast Breast - Left-Symmetric, Non Tender, No Biopsy scars, no Dimpling, No Inflammation, No  Lumpectomy scars, No Mastectomy scars, No Peau d' Orange. Breast - Right-Symmetric, Non Tender, No Biopsy scars, no Dimpling, No Inflammation, No Lumpectomy scars, No Mastectomy scars, No Peau d' Orange. Breast Lump-No Palpable Breast Mass. Note: Tiny biopsy site right breast lower outer quadrant. Minimal ecchymoses. No hematoma. No other skin changes in either breast. Nipple and areola complexes looked normal. No palpable mass in either breast. No axillary adenopathy on either side.   Cardiovascular Cardiovascular examination reveals -normal heart sounds, regular rate and rhythm with no murmurs and normal pedal pulses bilaterally.  Abdomen Inspection Inspection of the abdomen reveals - No Hernias. Skin - Scar - Note: Healed Pfannenstiel incision without hernia. Palpation/Percussion Palpation and Percussion of the abdomen reveal - Soft, Non Tender, No Rebound tenderness, No Rigidity (guarding) and No hepatosplenomegaly. Auscultation Auscultation of the abdomen reveals - Bowel sounds normal.  Neurologic Neurologic evaluation reveals -alert and oriented x 3 with no impairment of recent or remote memory. Mental Status-Normal.  Musculoskeletal Normal Exam - Left-Upper Extremity Strength Normal and Lower Extremity Strength Normal. Normal Exam - Right-Upper Extremity Strength Normal and Lower Extremity Strength Normal.  Lymphatic Head & Neck  General Head & Neck Lymphatics: Bilateral - Description - Normal. Axillary  General Axillary Region: Bilateral - Description - Normal. Tenderness - Non Tender. Femoral & Inguinal  Generalized Femoral & Inguinal Lymphatics: Bilateral - Description - Normal. Tenderness - Non Tender.    Assessment & Plan PRIMARY CANCER OF UPPER OUTER QUADRANT OF RIGHT FEMALE BREAST (C50.411) Impression: High-grade DCIS, receptor positive     Your recent imaging studies and biopsy showed an area of high-grade ductal carcinoma in situ in the  right breast, upper outer quadrant. This is near the nipple but probably not involving it. The tumor is sensitive to estrogen and progesterone  You have been advised to discontinue all estrogen containing medicines now  We have discussed the options of lumpectomy versus mastectomy  with or without reconstruction There is no survival benefit to mastectomy Both you and I have agreed that they simple lumpectomy is preferred   you will be scheduled for a right breast lumpectomy with radioactive seed localization I have discussed the indications, techniques, and risks of this surgery  Please discuss management of your insulin therapy with Dr. Dwyane Dee, your endocrinologist. I will send him a copy of today's record  Prior to the surgery, you are going to get an MRI of your brain because of the headaches and balance problems that you have experienced recently. Dr. Cyndy Freeze will let you know what this shows  You'll be referred to a medical oncologist and a radiation oncologist, as we discussed.  Pt Education - [facility] Breast Cancer Information Given - [facility] "Breast Journey" Package TYPE 2 DIABETES MELLITUS WITH INSULIN THERAPY (E11.9) Impression: toujeo long acting daily; humalog TID HISTORY OF HYPOPHYSECTOMY (E89.3) Impression: Takes multiple hormonal replacements directed by Dr. Elayne Snare HISTORY OF ABDOMINAL HYSTERECTOMY (Z90.710) Impression: Patient's states ovaries were not removed HISTORY OF VENTRAL HERNIA REPAIR (Z98.890) Impression: Patient states this was in the Pfannenstiel incision BMI 35.0-35.9,ADULT (Z68.35) HISTORY OF MENINGIOMA OF THE BRAIN (Z86.011) Impression: Followed by Dr. Dayton Bailiff. WORSENING HEADACHES (R51) Impression: Patient states that headaches, balance problems are new and MRI is scheduled next week. HISTORY OF THYROID SURGERY [phone]) Impression: Extent of surgery unknown, but not on thyroid hormone replacement     Walaa Carel M. Dalbert Batman, M.D., [facility] General and Minimally invasive Surgery Breast and Colorectal Surgery Office:   [phone] Pager:   [phone]

## 2016-04-18 ENCOUNTER — Ambulatory Visit
Admission: RE | Admit: 2016-04-18 | Discharge: 2016-04-18 | Disposition: A | Discharge status: Home / Self Care | Payer: Managed Care, Other (non HMO) | Source: Ambulatory Visit | Attending: General Surgery | Admitting: General Surgery

## 2016-04-18 DIAGNOSIS — C50411 Malignant neoplasm of upper-outer quadrant of right female breast: Secondary | ICD-10-CM

## 2016-04-18 DIAGNOSIS — Z170 Estrogen receptor positive status [ER+]: Principal | ICD-10-CM

## 2016-04-19 ENCOUNTER — Other Ambulatory Visit: Payer: Self-pay | Admitting: Endocrinology

## 2016-04-20 ENCOUNTER — Encounter (HOSPITAL_BASED_OUTPATIENT_CLINIC_OR_DEPARTMENT_OTHER): Payer: Self-pay | Admitting: Certified Registered"

## 2016-04-20 ENCOUNTER — Ambulatory Visit (HOSPITAL_BASED_OUTPATIENT_CLINIC_OR_DEPARTMENT_OTHER): Payer: Managed Care, Other (non HMO) | Admitting: Certified Registered"

## 2016-04-20 ENCOUNTER — Ambulatory Visit (HOSPITAL_BASED_OUTPATIENT_CLINIC_OR_DEPARTMENT_OTHER)
Admission: RE | Admit: 2016-04-20 | Discharge: 2016-04-20 | Disposition: A | Discharge status: Home / Self Care | Payer: Managed Care, Other (non HMO) | Source: Ambulatory Visit | Attending: General Surgery | Admitting: General Surgery

## 2016-04-20 ENCOUNTER — Ambulatory Visit
Admission: RE | Admit: 2016-04-20 | Discharge: 2016-04-20 | Disposition: A | Discharge status: Home / Self Care | Payer: Managed Care, Other (non HMO) | Source: Ambulatory Visit | Attending: General Surgery | Admitting: General Surgery

## 2016-04-20 ENCOUNTER — Other Ambulatory Visit: Payer: Self-pay | Admitting: General Surgery

## 2016-04-20 ENCOUNTER — Encounter (HOSPITAL_BASED_OUTPATIENT_CLINIC_OR_DEPARTMENT_OTHER)
Admission: RE | Disposition: A | Discharge status: Home / Self Care | Payer: Self-pay | Source: Ambulatory Visit | Attending: General Surgery

## 2016-04-20 DIAGNOSIS — Z79899 Other long term (current) drug therapy: Secondary | ICD-10-CM | POA: Diagnosis not present

## 2016-04-20 DIAGNOSIS — Z87891 Personal history of nicotine dependence: Secondary | ICD-10-CM | POA: Insufficient documentation

## 2016-04-20 DIAGNOSIS — K219 Gastro-esophageal reflux disease without esophagitis: Secondary | ICD-10-CM | POA: Insufficient documentation

## 2016-04-20 DIAGNOSIS — D0511 Intraductal carcinoma in situ of right breast: Secondary | ICD-10-CM | POA: Insufficient documentation

## 2016-04-20 DIAGNOSIS — E079 Disorder of thyroid, unspecified: Secondary | ICD-10-CM | POA: Diagnosis not present

## 2016-04-20 DIAGNOSIS — Z8673 Personal history of transient ischemic attack (TIA), and cerebral infarction without residual deficits: Secondary | ICD-10-CM | POA: Diagnosis not present

## 2016-04-20 DIAGNOSIS — J45909 Unspecified asthma, uncomplicated: Secondary | ICD-10-CM | POA: Diagnosis not present

## 2016-04-20 DIAGNOSIS — E7800 Pure hypercholesterolemia, unspecified: Secondary | ICD-10-CM | POA: Insufficient documentation

## 2016-04-20 DIAGNOSIS — Z86011 Personal history of benign neoplasm of the brain: Secondary | ICD-10-CM | POA: Insufficient documentation

## 2016-04-20 DIAGNOSIS — Z8249 Family history of ischemic heart disease and other diseases of the circulatory system: Secondary | ICD-10-CM | POA: Insufficient documentation

## 2016-04-20 DIAGNOSIS — Z833 Family history of diabetes mellitus: Secondary | ICD-10-CM | POA: Diagnosis not present

## 2016-04-20 DIAGNOSIS — Z888 Allergy status to other drugs, medicaments and biological substances status: Secondary | ICD-10-CM | POA: Insufficient documentation

## 2016-04-20 DIAGNOSIS — Z170 Estrogen receptor positive status [ER+]: Secondary | ICD-10-CM | POA: Diagnosis not present

## 2016-04-20 DIAGNOSIS — F419 Anxiety disorder, unspecified: Secondary | ICD-10-CM | POA: Insufficient documentation

## 2016-04-20 DIAGNOSIS — Z7982 Long term (current) use of aspirin: Secondary | ICD-10-CM | POA: Insufficient documentation

## 2016-04-20 DIAGNOSIS — E119 Type 2 diabetes mellitus without complications: Secondary | ICD-10-CM | POA: Insufficient documentation

## 2016-04-20 DIAGNOSIS — Z90710 Acquired absence of both cervix and uterus: Secondary | ICD-10-CM | POA: Insufficient documentation

## 2016-04-20 DIAGNOSIS — C50411 Malignant neoplasm of upper-outer quadrant of right female breast: Secondary | ICD-10-CM

## 2016-04-20 DIAGNOSIS — Z791 Long term (current) use of non-steroidal anti-inflammatories (NSAID): Secondary | ICD-10-CM | POA: Diagnosis not present

## 2016-04-20 DIAGNOSIS — Z794 Long term (current) use of insulin: Secondary | ICD-10-CM | POA: Insufficient documentation

## 2016-04-20 DIAGNOSIS — Z79890 Hormone replacement therapy (postmenopausal): Secondary | ICD-10-CM | POA: Diagnosis not present

## 2016-04-20 DIAGNOSIS — D241 Benign neoplasm of right breast: Secondary | ICD-10-CM | POA: Insufficient documentation

## 2016-04-20 DIAGNOSIS — Z9889 Other specified postprocedural states: Secondary | ICD-10-CM | POA: Diagnosis not present

## 2016-04-20 DIAGNOSIS — N6011 Diffuse cystic mastopathy of right breast: Secondary | ICD-10-CM | POA: Insufficient documentation

## 2016-04-20 DIAGNOSIS — I10 Essential (primary) hypertension: Secondary | ICD-10-CM | POA: Diagnosis not present

## 2016-04-20 DIAGNOSIS — Z811 Family history of alcohol abuse and dependence: Secondary | ICD-10-CM | POA: Insufficient documentation

## 2016-04-20 DIAGNOSIS — Z820 Family history of epilepsy and other diseases of the nervous system: Secondary | ICD-10-CM | POA: Insufficient documentation

## 2016-04-20 HISTORY — DX: Cerebral infarction, unspecified: I63.9

## 2016-04-20 HISTORY — PX: BREAST LUMPECTOMY WITH RADIOACTIVE SEED LOCALIZATION: SHX6424

## 2016-04-20 LAB — GLUCOSE, CAPILLARY
GLUCOSE-CAPILLARY: 110 mg/dL — AB (ref 65–99)
GLUCOSE-CAPILLARY: 102 mg/dL — AB (ref 65–99)
Glucose-Capillary: 117 mg/dL — ABNORMAL HIGH (ref 65–99)

## 2016-04-20 SURGERY — BREAST LUMPECTOMY WITH RADIOACTIVE SEED LOCALIZATION
Anesthesia: General | Site: Breast | Laterality: Right

## 2016-04-20 MED ORDER — MIDAZOLAM HCL 2 MG/2ML IJ SOLN
INTRAMUSCULAR | Status: AC
  Filled 2016-04-20: qty 2

## 2016-04-20 MED ORDER — LIDOCAINE 2% (20 MG/ML) 5 ML SYRINGE
INTRAMUSCULAR | Status: DC | PRN
  Administered 2016-04-20: 70 mg via INTRAVENOUS

## 2016-04-20 MED ORDER — MIDAZOLAM HCL 2 MG/2ML IJ SOLN
1.0000 mg | INTRAMUSCULAR | Status: DC | PRN
  Administered 2016-04-20: 1 mg via INTRAVENOUS

## 2016-04-20 MED ORDER — LIDOCAINE 2% (20 MG/ML) 5 ML SYRINGE
INTRAMUSCULAR | Status: AC
  Filled 2016-04-20: qty 5

## 2016-04-20 MED ORDER — PROPOFOL 10 MG/ML IV BOLUS
INTRAVENOUS | Status: AC
  Filled 2016-04-20: qty 20

## 2016-04-20 MED ORDER — EPHEDRINE 5 MG/ML INJ
INTRAVENOUS | Status: AC
  Filled 2016-04-20: qty 10

## 2016-04-20 MED ORDER — FENTANYL CITRATE (PF) 100 MCG/2ML IJ SOLN
50.0000 ug | INTRAMUSCULAR | Status: DC | PRN
  Administered 2016-04-20: 50 ug via INTRAVENOUS

## 2016-04-20 MED ORDER — BUPIVACAINE HCL (PF) 0.5 % IJ SOLN
INTRAMUSCULAR | Status: DC | PRN
  Administered 2016-04-20: 10 mL

## 2016-04-20 MED ORDER — ONDANSETRON HCL 4 MG/2ML IJ SOLN
INTRAMUSCULAR | Status: AC
  Filled 2016-04-20: qty 2

## 2016-04-20 MED ORDER — PHENYLEPHRINE 40 MCG/ML (10ML) SYRINGE FOR IV PUSH (FOR BLOOD PRESSURE SUPPORT)
PREFILLED_SYRINGE | INTRAVENOUS | Status: AC
  Filled 2016-04-20: qty 10

## 2016-04-20 MED ORDER — EPHEDRINE SULFATE 50 MG/ML IJ SOLN
INTRAMUSCULAR | Status: DC | PRN
  Administered 2016-04-20 (×2): 10 mg via INTRAVENOUS

## 2016-04-20 MED ORDER — HYDROMORPHONE HCL 1 MG/ML IJ SOLN
0.2500 mg | INTRAMUSCULAR | Status: DC | PRN
  Administered 2016-04-20: 0.5 mg via INTRAVENOUS

## 2016-04-20 MED ORDER — PROPOFOL 10 MG/ML IV BOLUS
INTRAVENOUS | Status: DC | PRN
Start: 2016-04-20 — End: 2016-04-20
  Administered 2016-04-20: 200 mg via INTRAVENOUS

## 2016-04-20 MED ORDER — HYDROMORPHONE HCL 1 MG/ML IJ SOLN
INTRAMUSCULAR | Status: AC
  Filled 2016-04-20: qty 1

## 2016-04-20 MED ORDER — FENTANYL CITRATE (PF) 100 MCG/2ML IJ SOLN
INTRAMUSCULAR | Status: AC
  Filled 2016-04-20: qty 2

## 2016-04-20 MED ORDER — LACTATED RINGERS IV SOLN
INTRAVENOUS | Status: DC
  Administered 2016-04-20 (×2): via INTRAVENOUS

## 2016-04-20 MED ORDER — PHENYLEPHRINE HCL 10 MG/ML IJ SOLN
INTRAMUSCULAR | Status: DC | PRN
  Administered 2016-04-20: 80 ug via INTRAVENOUS
  Administered 2016-04-20 (×2): 40 ug via INTRAVENOUS
  Administered 2016-04-20: 80 ug via INTRAVENOUS

## 2016-04-20 MED ORDER — DEXAMETHASONE SODIUM PHOSPHATE 10 MG/ML IJ SOLN
INTRAMUSCULAR | Status: AC
  Filled 2016-04-20: qty 1

## 2016-04-20 MED ORDER — DEXAMETHASONE SODIUM PHOSPHATE 4 MG/ML IJ SOLN
INTRAMUSCULAR | Status: DC | PRN
  Administered 2016-04-20: 4 mg via INTRAVENOUS

## 2016-04-20 MED ORDER — CEFAZOLIN SODIUM-DEXTROSE 2-4 GM/100ML-% IV SOLN
INTRAVENOUS | Status: AC
  Filled 2016-04-20: qty 100

## 2016-04-20 MED ORDER — ATROPINE SULFATE 0.4 MG/ML IJ SOLN
INTRAMUSCULAR | Status: AC
  Filled 2016-04-20: qty 1

## 2016-04-20 MED ORDER — CEFAZOLIN SODIUM-DEXTROSE 2-3 GM-% IV SOLR
INTRAVENOUS | Status: DC | PRN
  Administered 2016-04-20: 2 g via INTRAVENOUS

## 2016-04-20 MED ORDER — ONDANSETRON HCL 4 MG/2ML IJ SOLN
INTRAMUSCULAR | Status: DC | PRN
  Administered 2016-04-20: 4 mg via INTRAVENOUS

## 2016-04-20 MED ORDER — SCOPOLAMINE 1 MG/3DAYS TD PT72: 1.0000 | MEDICATED_PATCH | Freq: Once | TRANSDERMAL | Status: DC | PRN

## 2016-04-20 MED ORDER — HYDROCODONE-ACETAMINOPHEN 5-325 MG PO TABS: 1.0000 | ORAL_TABLET | Freq: Four times a day (QID) | ORAL | 0 refills | Status: DC | PRN

## 2016-04-20 SURGICAL SUPPLY — 60 items
APPLIER CLIP 9.375 MED OPEN (MISCELLANEOUS)
BENZOIN TINCTURE PRP APPL 2/3 (GAUZE/BANDAGES/DRESSINGS) IMPLANT
BINDER BREAST LRG (GAUZE/BANDAGES/DRESSINGS) IMPLANT
BINDER BREAST MEDIUM (GAUZE/BANDAGES/DRESSINGS) IMPLANT
BINDER BREAST XLRG (GAUZE/BANDAGES/DRESSINGS) IMPLANT
BINDER BREAST XXLRG (GAUZE/BANDAGES/DRESSINGS) ×2 IMPLANT
BLADE HEX COATED 2.75 (ELECTRODE) ×2 IMPLANT
BLADE SURG 10 STRL SS (BLADE) IMPLANT
BLADE SURG 15 STRL LF DISP TIS (BLADE) ×1 IMPLANT
BLADE SURG 15 STRL SS (BLADE) ×1
CANISTER SUC SOCK COL 7IN (MISCELLANEOUS) IMPLANT
CANISTER SUCT 1200ML W/VALVE (MISCELLANEOUS) ×2 IMPLANT
CHLORAPREP W/TINT 26ML (MISCELLANEOUS) ×2 IMPLANT
CLIP APPLIE 9.375 MED OPEN (MISCELLANEOUS) IMPLANT
COVER BACK TABLE 60X90IN (DRAPES) ×2 IMPLANT
COVER MAYO STAND STRL (DRAPES) ×2 IMPLANT
COVER PROBE W GEL 5X96 (DRAPES) ×2 IMPLANT
DECANTER SPIKE VIAL GLASS SM (MISCELLANEOUS) IMPLANT
DERMABOND ADVANCED (GAUZE/BANDAGES/DRESSINGS) ×1
DERMABOND ADVANCED .7 DNX12 (GAUZE/BANDAGES/DRESSINGS) ×1 IMPLANT
DEVICE DUBIN W/COMP PLATE 8390 (MISCELLANEOUS) ×2 IMPLANT
DRAPE LAPAROSCOPIC ABDOMINAL (DRAPES) ×2 IMPLANT
DRAPE UTILITY XL STRL (DRAPES) ×2 IMPLANT
DRSG PAD ABDOMINAL 8X10 ST (GAUZE/BANDAGES/DRESSINGS) IMPLANT
ELECT REM PT RETURN 9FT ADLT (ELECTROSURGICAL) ×2
ELECTRODE REM PT RTRN 9FT ADLT (ELECTROSURGICAL) ×1 IMPLANT
GLOVE BIO SURGEON STRL SZ 6.5 (GLOVE) ×2 IMPLANT
GLOVE BIOGEL PI IND STRL 7.0 (GLOVE) ×1 IMPLANT
GLOVE BIOGEL PI INDICATOR 7.0 (GLOVE) ×1
GLOVE EUDERMIC 7 POWDERFREE (GLOVE) ×2 IMPLANT
GOWN STRL REUS W/ TWL LRG LVL3 (GOWN DISPOSABLE) ×1 IMPLANT
GOWN STRL REUS W/ TWL XL LVL3 (GOWN DISPOSABLE) ×1 IMPLANT
GOWN STRL REUS W/TWL LRG LVL3 (GOWN DISPOSABLE) ×1
GOWN STRL REUS W/TWL XL LVL3 (GOWN DISPOSABLE) ×1
ILLUMINATOR WAVEGUIDE N/F (MISCELLANEOUS) IMPLANT
KIT MARKER MARGIN INK (KITS) ×2 IMPLANT
LIGHT WAVEGUIDE WIDE FLAT (MISCELLANEOUS) IMPLANT
NEEDLE HYPO 25X1 1.5 SAFETY (NEEDLE) ×2 IMPLANT
NS IRRIG 1000ML POUR BTL (IV SOLUTION) ×2 IMPLANT
PACK BASIN DAY SURGERY FS (CUSTOM PROCEDURE TRAY) ×2 IMPLANT
PENCIL BUTTON HOLSTER BLD 10FT (ELECTRODE) ×2 IMPLANT
SHEET MEDIUM DRAPE 40X70 STRL (DRAPES) IMPLANT
SLEEVE SCD COMPRESS KNEE MED (MISCELLANEOUS) ×2 IMPLANT
SPONGE GAUZE 4X4 12PLY STER LF (GAUZE/BANDAGES/DRESSINGS) IMPLANT
SPONGE LAP 18X18 X RAY DECT (DISPOSABLE) IMPLANT
SPONGE LAP 4X18 X RAY DECT (DISPOSABLE) ×2 IMPLANT
STRIP CLOSURE SKIN 1/2X4 (GAUZE/BANDAGES/DRESSINGS) IMPLANT
SUT ETHILON 3 0 FSL (SUTURE) IMPLANT
SUT MNCRL AB 4-0 PS2 18 (SUTURE) ×2 IMPLANT
SUT SILK 2 0 SH (SUTURE) ×2 IMPLANT
SUT VIC AB 2-0 CT1 27 (SUTURE)
SUT VIC AB 2-0 CT1 TAPERPNT 27 (SUTURE) IMPLANT
SUT VIC AB 3-0 SH 27 (SUTURE)
SUT VIC AB 3-0 SH 27X BRD (SUTURE) IMPLANT
SUT VICRYL 3-0 CR8 SH (SUTURE) ×2 IMPLANT
SYR 10ML LL (SYRINGE) ×2 IMPLANT
TOWEL OR 17X24 6PK STRL BLUE (TOWEL DISPOSABLE) ×2 IMPLANT
TOWEL OR NON WOVEN STRL DISP B (DISPOSABLE) IMPLANT
TUBE CONNECTING 20X1/4 (TUBING) ×2 IMPLANT
YANKAUER SUCT BULB TIP NO VENT (SUCTIONS) ×2 IMPLANT

## 2016-04-20 NOTE — Anesthesia Procedure Notes (Signed)
Procedure Name: LMA Insertion Date/Time: 04/20/2016 12:59 PM Performed by: Baxter Flattery Pre-anesthesia Checklist: Patient identified, Emergency Drugs available, Suction available and Patient being monitored Patient Re-evaluated:Patient Re-evaluated prior to inductionOxygen Delivery Method: Circle system utilized Preoxygenation: Pre-oxygenation with 100% oxygen Intubation Type: IV induction Ventilation: Mask ventilation without difficulty LMA: LMA inserted LMA Size: 4.0 Number of attempts: 1 Airway Equipment and Method: Bite block Placement Confirmation: positive ETCO2 and breath sounds checked- equal and bilateral Tube secured with: Tape Dental Injury: Teeth and Oropharynx as per pre-operative assessment

## 2016-04-20 NOTE — Discharge Instructions (Signed)
[facility] °Office Phone Number [phone] ° °BREAST BIOPSY/ PARTIAL MASTECTOMY: POST OP INSTRUCTIONS ° °Always review your discharge instruction sheet given to you by the facility where your surgery was performed. ° °IF YOU HAVE DISABILITY OR FAMILY LEAVE FORMS, YOU MUST BRING THEM TO THE OFFICE FOR PROCESSING.  DO NOT GIVE THEM TO YOUR DOCTOR. ° °1. A prescription for pain medication may be given to you upon discharge.  Take your pain medication as prescribed, if needed.  If narcotic pain medicine is not needed, then you may take acetaminophen (Tylenol) or ibuprofen (Advil) as needed. °2. Take your usually prescribed medications unless otherwise directed °3. If you need a refill on your pain medication, please contact your pharmacy.  They will contact our office to request authorization.  Prescriptions will not be filled after 5pm or on week-ends. °4. You should eat very light the first 24 hours after surgery, such as soup, crackers, pudding, etc.  Resume your normal diet the day after surgery. °5. Most patients will experience some swelling and bruising in the breast.  Ice packs and a good support bra will help.  Swelling and bruising can take several days to resolve.  °6. It is common to experience some constipation if taking pain medication after surgery.  Increasing fluid intake and taking a stool softener will usually help or prevent this problem from occurring.  A mild laxative (Milk of Magnesia or Miralax) should be taken according to package directions if there are no bowel movements after 48 hours. °7. Unless discharge instructions indicate otherwise, you may remove your bandages 24-48 hours after surgery, and you may shower at that time.  You may have steri-strips (small skin tapes) in place directly over the incision.  These strips should be left on the skin for 7-10 days.  If your surgeon used skin glue on the incision, you may shower in 24 hours.  The glue will flake off over the  next 2-3 weeks.  Any sutures or staples will be removed at the office during your follow-up visit. °8. ACTIVITIES:  You may resume regular daily activities (gradually increasing) beginning the next day.  Wearing a good support bra or sports bra minimizes pain and swelling.  You may have sexual intercourse when it is comfortable. °a. You may drive when you no longer are taking prescription pain medication, you can comfortably wear a seatbelt, and you can safely maneuver your car and apply brakes. °b. RETURN TO WORK:  ______________________________________________________________________________________ °9. You should see your doctor in the office for a follow-up appointment approximately two weeks after your surgery.  Your doctor’s nurse will typically make your follow-up appointment when she calls you with your pathology report.  Expect your pathology report 2-3 business days after your surgery.  You may call to check if you do not hear from us after three days. °10. OTHER INSTRUCTIONS: _______________________________________________________________________________________________ _____________________________________________________________________________________________________________________________________ °_____________________________________________________________________________________________________________________________________ °_____________________________________________________________________________________________________________________________________ ° °WHEN TO CALL YOUR DOCTOR: °1. Fever over 101.0 °2. Nausea and/or vomiting. °3. Extreme swelling or bruising. °4. Continued bleeding from incision. °5. Increased pain, redness, or drainage from the incision. ° °The clinic staff is available to answer your questions during regular business hours.  Please don’t hesitate to call and ask to speak to one of the nurses for clinical concerns.  If you have a medical emergency, go to the nearest  emergency room or call 911.  A surgeon from [facility] is always on call at the hospital. ° °For further questions, please visit [email or web address]  ° ° ° °  Post Anesthesia Home Care Instructions ° °Activity: °Get plenty of rest for the remainder of the day. A responsible adult should stay with you for 24 hours following the procedure.  °For the next 24 hours, DO NOT: °-Drive a car °-Operate machinery °-Drink alcoholic beverages °-Take any medication unless instructed by your physician °-Make any legal decisions or sign important papers. ° °Meals: °Start with liquid foods such as gelatin or soup. Progress to regular foods as tolerated. Avoid greasy, spicy, heavy foods. If nausea and/or vomiting occur, drink only clear liquids until the nausea and/or vomiting subsides. Call your physician if vomiting continues. ° °Special Instructions/Symptoms: °Your throat may feel dry or sore from the anesthesia or the breathing tube placed in your throat during surgery. If this causes discomfort, gargle with warm salt water. The discomfort should disappear within 24 hours. ° °If you had a scopolamine patch placed behind your ear for the management of post- operative nausea and/or vomiting: ° °1. The medication in the patch is effective for 72 hours, after which it should be removed.  Wrap patch in a tissue and discard in the trash. Wash hands thoroughly with soap and water. °2. You may remove the patch earlier than 72 hours if you experience unpleasant side effects which may include dry mouth, dizziness or visual disturbances. °3. Avoid touching the patch. Wash your hands with soap and water after contact with the patch. °  ° °

## 2016-04-20 NOTE — Interval H&P Note (Signed)
History and Physical Interval Note:  04/20/2016 11:51 AM  Destiny Beasley  has presented today for surgery, with the diagnosis of RIGHT BREAST CANCER  The various methods of treatment have been discussed with the patient and family. After consideration of risks, benefits and other options for treatment, the patient has consented to  Procedure(s): RIGHT BREAST LUMPECTOMY WITH RADIOACTIVE SEED LOCALIZATION (Right) as a surgical intervention .  The patient's history has been reviewed, patient examined, no change in status, stable for surgery.  I have reviewed the patient's chart and labs.  Questions were answered to the patient's satisfaction.     Adin Hector

## 2016-04-20 NOTE — Anesthesia Preprocedure Evaluation (Addendum)
Anesthesia Evaluation  Patient identified by MRN, date of birth, ID band Patient awake    Reviewed: Allergy & Precautions, NPO status , Patient's Chart, lab work & pertinent test results  Airway Mallampati: II  TM Distance: >3 FB     Dental   Pulmonary sleep apnea ,    breath sounds clear to auscultation       Cardiovascular hypertension,  Rhythm:Regular Rate:Normal     Neuro/Psych    GI/Hepatic Neg liver ROS, GERD  ,  Endo/Other  diabetes  Renal/GU negative Renal ROS     Musculoskeletal   Abdominal   Peds  Hematology   Anesthesia Other Findings   Reproductive/Obstetrics                           Anesthesia Physical Anesthesia Plan  ASA: III  Anesthesia Plan: General   Post-op Pain Management:    Induction: Intravenous  Airway Management Planned: Oral ETT  Additional Equipment:   Intra-op Plan:   Post-operative Plan: Extubation in OR  Informed Consent: I have reviewed the patients History and Physical, chart, labs and discussed the procedure including the risks, benefits and alternatives for the proposed anesthesia with the patient or authorized representative who has indicated his/her understanding and acceptance.   Dental advisory given  Plan Discussed with: CRNA and Anesthesiologist  Anesthesia Plan Comments:         Anesthesia Quick Evaluation

## 2016-04-20 NOTE — Transfer of Care (Signed)
Immediate Anesthesia Transfer of Care Note  Patient: Destiny Beasley  Procedure(s) Performed: Procedure(s): RIGHT BREAST LUMPECTOMY WITH RADIOACTIVE SEED LOCALIZATION (Right)  Patient Location: PACU  Anesthesia Type:General  Level of Consciousness: awake, alert , oriented and patient cooperative  Airway & Oxygen Therapy: Patient Spontanous Breathing and Patient connected to face mask oxygen  Post-op Assessment: Report given to RN, Post -op Vital signs reviewed and stable and Patient moving all extremities  Post vital signs: Reviewed and stable  Last Vitals:  Vitals:   04/20/16 1114  BP: (!) 147/84  Pulse: 85  Resp: 20  Temp: 36.8 C    Last Pain:  Vitals:   04/20/16 1114  TempSrc: Oral         Complications: No apparent anesthesia complications

## 2016-04-20 NOTE — Op Note (Signed)
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 Dalbert Batman, M.D., FACS  Assistant:                      OR staff   Indication for Assistant: N/A  Operative Indications:  . This is a 60 year old African-American female. She is referred by Dr. Jetta Lout at [facility] for evaluation and management of high-grade DCIS right breast. Dr. Serina Cowper is her PCP. Dr. Elayne Snare is her endocrinologist. Dr. Dayton Bailiff is her neurosurgeon. Her sister and her daughter are with her throughout the encounter.      She has no prior history of breast problems. She says this also calcifications in the right breast 2 years ago but she hasn't had a mammogram in 2 years. She is asymptomatic. Recent screening mammograms and then subsequent ultrasound shows category B density, 9 mm area of calcifications right breast, 9:30 position, 2 cm from the nipple. 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 ER 80%, PR 20%. She was discussed at breast conference this morning about to be a good candidate for lumpectomy.      Comorbidities include insulin-dependent diabetes mellitus. She takes long-acting insulin in the morning and a fast acting insulin 3 times a day. Dr. Dwyane Dee manages all of her endocrine replacement therapy She is had a pituitary adenoma resected several years ago somewhere out of town 2005. 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 She is followed by [place] Bailiff who gets MRI from time to time. She recently had a TIA and  is on aspirin and followed by neurology.  Neurology has cleared her to proceed with her breast cancer surgery.       She takes estrogen hormone replacement therapy, and I stressed that she discontinue that immediately.         Family history reveals that some remote relatives either secondary third cousins have breast cancer but there might have been 3 of them. No close relatives. No ovarian cancer. No colon cancer.       We had a long talk about her cancer. I told her that she would need some type of surgery since this was high-grade DCIS. We discussed the options of mastectomy versus lumpectomy. She does not need any lymph node biopsy. I told her my bias was lumpectomy and she agrees. She will be scheduled for right breast lumpectomy with radioactive seed localization. I discussed the indications, details, techniques, and numerous risk of the surgery with her and her family members. She is aware the risks of bleeding, infection, cosmetic deformity, chronic pain or numbness,  and sensitivity of the nipple, flattening of the nipple, reoperation for positive margins or if we find invasive disease. She understands all of these issues. All of her questions were answered. She agrees with this plan. She has requested that she see Dr. Lindi Adie and Dr. Isidore Moos in the cancer center, and those arrangements are being made. I told her that she would likely be offered whole breast radiation therapy and antiestrogen therapy postop. She knows that there is decisions will come later.    Operative Findings:       The radioactive seed was positioned very close to the original marker clip.  I could hear the audible signal of the radioactive seed using the neoprobe in the holding area.  The marker clip and radioactive seed were found in the deep middle third of the right breast at the 9:30 position.  I was able to make a circumareolar, hidden scar incision.  I thought that I was possibly close to the  lateral margin so I reexcised the lateral margin.  The specimen mammogram showed a slight density and the marker clip and the seed within the specimen.  Perhaps slightly close to the lateral margin as positioned.  Procedure in Detail:          Following the induction of general LMA anesthesia the patient's right breast was prepped and draped in a sterile fashion.  Intravenous antibiotics were given.  Surgical timeout was performed.  5% Marcaine was used as a local infiltration anesthetic.  Using the neoprobe frequently I fashioned a circumareolar incision laterally which was made with a knife.  Using the neoprobe frequently I dissected down into the breast tissue and performed the lumpectomy.  When I removed the specimen it sounded like the radioactive signal was closer to the lateral margin that other areas.  I marked the lumpectomy specimen with silk sutures and a 6 color ink kit to orient the pathologist.  The specimen mammogram showed the marker clip and the seed within the specimen.  I then re-excised the lateral margin and marked that with any and sent it as a separate specimen.     Hemostasis was excellent.  The wound was irrigated.  The lumpectomy cavity was marked with 5 metal marker clips.  The breast tissues were reapproximated with 3-0 Vicryl sutures.  The skin was closed with running subcuticular 4-0 Monocryl and Dermabond.  Dry bandages and a breast binder were placed.  The patient tolerated the procedure well was taken to PACU in stable condition.  EBL 15 mL or less.  Counts correct.  Complications none.     Edsel Petrin. Dalbert Batman, M.D., FACS General and Minimally Invasive Surgery Breast and Colorectal Surgery  04/20/2016 1:54 PM

## 2016-04-21 ENCOUNTER — Encounter (HOSPITAL_BASED_OUTPATIENT_CLINIC_OR_DEPARTMENT_OTHER): Payer: Self-pay | Admitting: General Surgery

## 2016-04-21 NOTE — Anesthesia Postprocedure Evaluation (Signed)
Anesthesia Post Note  Patient: Destiny Beasley  Procedure(s) Performed: Procedure(s) (LRB): RIGHT BREAST LUMPECTOMY WITH RADIOACTIVE SEED LOCALIZATION (Right)  Patient location during evaluation: PACU Anesthesia Type: General Level of consciousness: awake and alert Pain management: pain level controlled Vital Signs Assessment: post-procedure vital signs reviewed and stable Respiratory status: spontaneous breathing, nonlabored ventilation, respiratory function stable and patient connected to nasal cannula oxygen Cardiovascular status: blood pressure returned to baseline and stable Postop Assessment: no signs of nausea or vomiting Anesthetic complications: no       Last Vitals:  Vitals:   04/20/16 1430 04/20/16 1457  BP: (!) 156/94 (!) 155/80  Pulse: 88 85  Resp: 11 18  Temp:  36.6 C    Last Pain:  Vitals:   04/20/16 1457  TempSrc:   PainSc: 0-No pain                 Catalina Gravel

## 2016-04-25 ENCOUNTER — Telehealth: Payer: Self-pay | Admitting: Hematology and Oncology

## 2016-04-25 NOTE — Telephone Encounter (Signed)
Spoke with patient re 1/5 f/u.

## 2016-04-25 NOTE — Progress Notes (Signed)
Inform patient of Pathology report,. As expected, this shows ductal carcinoma in situ.  The tumor was 1.3 cm diameter. The good news is that the margins are negative and that there is no evidence of invasive cancer She will not need any further surgery I will discuss this with her in detail at her next office visit Make sure she has an appointment and referral back to medical oncology and radiation oncology.  hmi

## 2016-04-28 ENCOUNTER — Ambulatory Visit (HOSPITAL_BASED_OUTPATIENT_CLINIC_OR_DEPARTMENT_OTHER): Payer: Managed Care, Other (non HMO) | Admitting: Hematology and Oncology

## 2016-04-28 ENCOUNTER — Encounter: Payer: Self-pay | Admitting: Hematology and Oncology

## 2016-04-28 DIAGNOSIS — D0511 Intraductal carcinoma in situ of right breast: Secondary | ICD-10-CM

## 2016-04-28 DIAGNOSIS — Z170 Estrogen receptor positive status [ER+]: Secondary | ICD-10-CM | POA: Diagnosis not present

## 2016-04-28 DIAGNOSIS — C50411 Malignant neoplasm of upper-outer quadrant of right female breast: Secondary | ICD-10-CM

## 2016-04-28 NOTE — Assessment & Plan Note (Signed)
Right lumpectomy 04/20/2016: DCIS intermediate grade 1.3 cm, margins negative, intraductal papilloma the lateral margin, ER 80%, PR 20%, Tis Nx stage 0   Pathology counseling: I discussed the final pathology report of the patient provided  a copy of this report. I discussed the margins, which are negative. We also discussed the final staging along with previously performed ER/PR  testing.  Recommendation: 1. Adjuvant radiation therapy 2. Followed by antiestrogen therapy with tamoxifen 5 years  Prognosis: [facility] Nomogram: With lumpectomy radiation and tamoxifen her risk of recurrence was 3% or 5 years and 4% over 10 years. If she did not do radiation her risk of recurrence was 7% over 5 years 11% over 10 years. With her antiestrogen therapy risk of recurrence of 6% or 5 years and 9% over 10 years. If she did not do anything after surgery her risk of recurrence is 14% or 5 years and 22% over 10 years.  Patient looked at this data and was contemplating whether she could skip doing antiestrogen therapy.

## 2016-04-28 NOTE — Progress Notes (Signed)
Patient Care Team: Lawerance Cruel, MD as PCP - General (Family Medicine) Fanny Skates, MD as Consulting Physician (General Surgery) Truitt Merle, MD as Consulting Physician (Hematology) Eppie Gibson, MD as Attending Physician (Radiation Oncology)  DIAGNOSIS:  Encounter Diagnosis  Name Primary?  . Malignant neoplasm of upper-outer quadrant of right breast in female, estrogen receptor positive ([place])     SUMMARY OF ONCOLOGIC HISTORY:   Malignant neoplasm of upper-outer quadrant of right female breast ([place])   02/21/2016 Initial Diagnosis    Rt Breast biopsy: HG DCIS with apocrine features arising in a intraductal papilloma, ER 80%, PR 20%, TisN0 (Stage 0)      04/20/2016 Surgery    Right lumpectomy: DCIS intermediate grade 1.3 cm, margins negative, intraductal papilloma the lateral margin, ER 80%, PR 20%, Tis Nx stage 0        CHIEF COMPLIANT: Follow-up after recent right lumpectomy  INTERVAL HISTORY: Destiny Beasley is a 61 year old with above-mentioned history of right breast DCIS who underwent lumpectomy and is here today to discuss the results. She is recovered very well from the surgery. She is very mild discomfort in the breasts.  REVIEW OF SYSTEMS:   Constitutional: Denies fevers, chills or abnormal weight loss Eyes: Denies blurriness of vision Ears, nose, mouth, throat, and face: Denies mucositis or sore throat Respiratory: Denies cough, dyspnea or wheezes Cardiovascular: Denies palpitation, chest discomfort Gastrointestinal:  Denies nausea, heartburn or change in bowel habits Skin: Denies abnormal skin rashes Lymphatics: Denies new lymphadenopathy or easy bruising Neurological:Denies numbness, tingling or new weaknesses Behavioral/Psych: Mood is stable, no new changes  Extremities: No lower extremity edema Breast: Recent right lumpectomy All other systems were reviewed with the patient and are negative.  I have reviewed the past medical history, past surgical  history, social history and family history with the patient and they are unchanged from previous note.  ALLERGIES:  is allergic to invokana [canagliflozin].  MEDICATIONS:  Current Outpatient Prescriptions  Medication Sig Dispense Refill  . APIDRA SOLOSTAR 100 UNIT/ML Solostar Pen INEJCT 15 UNITS INTO THE SKIN IN THE MORNING, 15 UNITS AT LUNCH AND 20 UNITS AT DINNER (Patient taking differently: INEJCT 15-20 subcutaneously units three times daily, inject 15 UNITS INTO THE SKIN IN THE MORNING, 15 UNITS AT LUNCH AND 20 UNITS AT DINNER) 15 pen 3  . aspirin EC 325 MG EC tablet Take 1 tablet (325 mg total) by mouth daily. 30 tablet 0  . B-D UF III MINI PEN NEEDLES 31G X 5 MM MISC USE 4 DAILY 200 each 3  . B-D UF III MINI PEN NEEDLES 31G X 5 MM MISC USE TO INJECT INSULIN FOUR TIMES DAILY 200 each 0  . Biotin 10000 MCG TABS Take by mouth.    . Cholecalciferol (VITAMIN D-3 PO) Take by mouth.    . Cyanocobalamin (VITAMIN B-12) 2500 MCG SUBL Place under the tongue.    . DULERA 100-5 MCG/ACT AERO Inhale 2 puffs into the lungs 2 (two) times daily as needed for wheezing or shortness of breath (allergies).     Marland Kitchen glucose blood (ONETOUCH VERIO) test strip USE AS DIRECTED TO CHECK BLOOD SUGARS THREE TIMES DAILY Dx code E11.65 150 each 3  . hydrochlorothiazide (HYDRODIURIL) 25 MG tablet Take 25 mg by mouth daily.    Marland Kitchen HYDROcodone-acetaminophen (NORCO) 5-325 MG tablet Take 1-2 tablets by mouth every 6 (six) hours as needed for moderate pain or severe pain. 30 tablet 0  . Insulin Glargine (TOUJEO SOLOSTAR) 300 UNIT/ML SOPN Inject  70 Units into the skin daily. (Patient taking differently: Inject 45 Units into the skin daily. ) 8 pen 0  . Liraglutide (VICTOZA) 18 MG/3ML SOPN Inject 1.8 mg into the skin daily. Around 1500-1600pm    . montelukast (SINGULAIR) 10 MG tablet Take 10 mg by mouth at bedtime.    . Multiple Vitamin (MULITIVITAMIN WITH MINERALS) TABS Take 1 tablet by mouth daily.      Marland Kitchen NOVOLOG FLEXPEN 100  UNIT/ML FlexPen INJECT 15 UNITS INTO THE SKIN AT BREAKFAST AND 15 UNITS AT LUNCH AND 20 UNITS AT DINNER. 15 mL 0  . olmesartan (BENICAR) 40 MG tablet Take 40 mg by mouth daily.    . pantoprazole (PROTONIX) 40 MG tablet Take 40 mg by mouth daily.     . rosuvastatin (CRESTOR) 20 MG tablet Take 20 mg by mouth daily.    . valACYclovir (VALTREX) 500 MG tablet Take 1,000 mg by mouth daily as needed (flareups/outbreaks).    . venlafaxine (EFFEXOR-XR) 150 MG 24 hr capsule Take 150 mg by mouth daily.       No current facility-administered medications for this visit.     PHYSICAL EXAMINATION: ECOG PERFORMANCE STATUS: 0 - Asymptomatic  Vitals:   04/28/16 1109  BP: 136/75  Pulse: (!) 101  Resp: 18  Temp: 97.9 F (36.6 C)   Filed Weights   04/28/16 1109  Weight: 260 lb 3.2 oz (118 kg)    GENERAL:alert, no distress and comfortable SKIN: skin color, texture, turgor are normal, no rashes or significant lesions EYES: normal, Conjunctiva are pink and non-injected, sclera clear OROPHARYNX:no exudate, no erythema and lips, buccal mucosa, and tongue normal  NECK: supple, thyroid normal size, non-tender, without nodularity LYMPH:  no palpable lymphadenopathy in the cervical, axillary or inguinal LUNGS: clear to auscultation and percussion with normal breathing effort HEART: regular rate & rhythm and no murmurs and no lower extremity edema ABDOMEN:abdomen soft, non-tender and normal bowel sounds MUSCULOSKELETAL:no cyanosis of digits and no clubbing  NEURO: alert & oriented x 3 with fluent speech, no focal motor/sensory deficits EXTREMITIES: No lower extremity edema   LABORATORY DATA:  I have reviewed the data as listed   Chemistry      Component Value Date/Time   NA 141 03/24/2016 0855   K 4.1 03/24/2016 0855   CL 102 03/24/2016 0855   CO2 27 03/24/2016 0834   BUN 23 (H) 03/24/2016 0855   CREATININE 0.90 03/24/2016 0855      Component Value Date/Time   CALCIUM 9.4 03/24/2016 0834    ALKPHOS 91 03/24/2016 0834   AST 28 03/24/2016 0834   ALT 35 03/24/2016 0834   BILITOT 0.4 03/24/2016 0834       Lab Results  Component Value Date   WBC 9.3 03/24/2016   HGB 14.6 03/24/2016   HCT 43.0 03/24/2016   MCV 88.1 03/24/2016   PLT 293 03/24/2016   NEUTROABS 4.5 03/24/2016    ASSESSMENT & PLAN:  Malignant neoplasm of upper-outer quadrant of right female breast ([facility]) Right lumpectomy 04/20/2016: DCIS intermediate grade 1.3 cm, margins negative, intraductal papilloma the lateral margin, ER 80%, PR 20%, Tis Nx stage 0   Pathology counseling: I discussed the final pathology report of the patient provided  a copy of this report. I discussed the margins, which are negative. We also discussed the final staging along with previously performed ER/PR  testing.  Recommendation: 1. Adjuvant radiation therapy 2. Followed by antiestrogen therapy with tamoxifen 5 years  Prognosis: MSKCC Nomogram: With  lumpectomy radiation and tamoxifen her risk of recurrence was 3% or 5 years and 4% over 10 years. If she did not do radiation her risk of recurrence was 7% over 5 years 11% over 10 years. With her antiestrogen therapy risk of recurrence of 6% or 5 years and 9% over 10 years. If she did not do anything after surgery her risk of recurrence is 14% or 5 years and 22% over 10 years.  Patient looked at this data and was contemplating whether she could skip doing antiestrogen therapy.   No orders of the defined types were placed in this encounter.  The patient has a good understanding of the overall plan. she agrees with it. she will call with any problems that may develop before the next visit here.   Rulon Eisenmenger, MD 04/28/16

## 2016-05-05 ENCOUNTER — Other Ambulatory Visit (INDEPENDENT_AMBULATORY_CARE_PROVIDER_SITE_OTHER): Payer: Managed Care, Other (non HMO)

## 2016-05-05 DIAGNOSIS — E1165 Type 2 diabetes mellitus with hyperglycemia: Secondary | ICD-10-CM | POA: Diagnosis not present

## 2016-05-05 DIAGNOSIS — Z794 Long term (current) use of insulin: Secondary | ICD-10-CM | POA: Diagnosis not present

## 2016-05-05 LAB — COMPREHENSIVE METABOLIC PANEL
ALT: 23 U/L (ref 0–35)
AST: 21 U/L (ref 0–37)
Albumin: 4.1 g/dL (ref 3.5–5.2)
Alkaline Phosphatase: 84 U/L (ref 39–117)
BUN: 18 mg/dL (ref 6–23)
CO2: 31 mEq/L (ref 19–32)
CREATININE: 1.05 mg/dL (ref 0.40–1.20)
Calcium: 9.7 mg/dL (ref 8.4–10.5)
Chloride: 104 mEq/L (ref 96–112)
GFR: 69.16 mL/min (ref >60.00)
GLUCOSE: 134 mg/dL — AB (ref 70–99)
Potassium: 4.4 mEq/L (ref 3.5–5.1)
SODIUM: 140 meq/L (ref 135–145)
Total Bilirubin: 0.3 mg/dL (ref 0.2–1.2)
Total Protein: 7.6 g/dL (ref 6.0–8.3)

## 2016-05-05 LAB — HEMOGLOBIN A1C: HEMOGLOBIN A1C: 10 % — AB (ref 4.6–6.5)

## 2016-05-05 LAB — LIPID PANEL
CHOL/HDL RATIO: 3
Cholesterol: 141 mg/dL (ref 0–200)
HDL: 48.1 mg/dL (ref >39.00)
LDL Cholesterol: 79 mg/dL (ref 0–99)
NONHDL: 93.33
Triglycerides: 74 mg/dL (ref 0.0–149.0)
VLDL: 14.8 mg/dL (ref 0.0–40.0)

## 2016-05-10 ENCOUNTER — Ambulatory Visit: Payer: Self-pay | Admitting: Endocrinology

## 2016-05-12 ENCOUNTER — Ambulatory Visit: Payer: Managed Care, Other (non HMO) | Admitting: Radiation Oncology

## 2016-05-18 ENCOUNTER — Other Ambulatory Visit: Payer: Self-pay | Admitting: Endocrinology

## 2016-05-19 ENCOUNTER — Ambulatory Visit
Admission: RE | Admit: 2016-05-19 | Discharge: 2016-05-19 | Disposition: A | Discharge status: Home / Self Care | Payer: Managed Care, Other (non HMO) | Source: Ambulatory Visit | Attending: Radiation Oncology | Admitting: Radiation Oncology

## 2016-05-19 DIAGNOSIS — Z170 Estrogen receptor positive status [ER+]: Secondary | ICD-10-CM | POA: Insufficient documentation

## 2016-05-19 DIAGNOSIS — C50411 Malignant neoplasm of upper-outer quadrant of right female breast: Secondary | ICD-10-CM

## 2016-05-19 DIAGNOSIS — Z79899 Other long term (current) drug therapy: Secondary | ICD-10-CM | POA: Diagnosis not present

## 2016-05-19 NOTE — Progress Notes (Signed)
Radiation Oncology         [phone] ________________________________  Name: Destiny Beasley MRN: [id number]  Date: 05/19/2016  DOB: 05/30/1955  Follow-Up Visit Note  Outpatient  CC: Duane Lope, MD  Serena Croissant, MD  Diagnosis:      ICD-9-CM ICD-10-CM   1. 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    She was initially seen in consultation at the multidisciplinary breast clinic. Initially, diagnostic mammogram on 02/18/16 showed a 0.9-1.0 cm mass with slightly irregular margins in the UOQ of the right breast. Physical exam at that time did not reveal any palpable masses. Ultrasound at the time revealed a 0.9 x 0.7 x 0.7 cm irregular hypoechoic mass at the 9:30 position 2 cm from the nipple. No lymphadenopathy was noted in the right axilla.  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   Since consultation, she underwent right lumpectomy on 04/20/16. This showed ductal carcinoma in situ. The DCIS was 1.3 cm diameter. The margins are negative by >83mm and there is no evidence of invasive cancer  She met with Dr. Pamelia Hoit on 04/28/16 to discuss antiestrogen therapy with Tamoxifen for 5 years; she is still weighing this option. She feels well, is healing well           ALLERGIES:  is allergic to invokana [canagliflozin].  Meds: Current Outpatient Prescriptions  Medication Sig Dispense Refill  . APIDRA SOLOSTAR 100 UNIT/ML Solostar Pen INEJCT 15 UNITS INTO THE SKIN IN THE MORNING, 15 UNITS AT LUNCH AND 20 UNITS AT DINNER (Patient taking differently: INEJCT 15-20 subcutaneously units three  times daily, inject 15 UNITS INTO THE SKIN IN THE MORNING, 15 UNITS AT LUNCH AND 20 UNITS AT DINNER) 15 pen 3  . aspirin EC 325 MG EC tablet Take 1 tablet (325 mg total) by mouth daily. 30 tablet 0  . B-D UF III MINI PEN NEEDLES 31G X 5 MM MISC USE 4 DAILY 200 each 3  . B-D UF III MINI PEN NEEDLES 31G X 5 MM MISC USE TO INJECT INSULIN FOUR TIMES DAILY 200 each 0  . Biotin 80303 MCG TABS Take by mouth.    . Cholecalciferol (VITAMIN D-3 PO) Take by mouth.    . Cyanocobalamin (VITAMIN B-12) 2500 MCG SUBL Place under the tongue.    . DULERA 100-5 MCG/ACT AERO Inhale 2 puffs into the lungs 2 (two) times daily as needed for wheezing or shortness of breath (allergies).     Marland Kitchen glucose blood (ONETOUCH VERIO) test strip USE AS DIRECTED TO CHECK BLOOD SUGARS THREE TIMES DAILY Dx code E11.65 150 each 3  . hydrochlorothiazide (HYDRODIURIL) 25 MG tablet Take 25 mg by mouth daily.    . Insulin Glargine (TOUJEO SOLOSTAR) 300 UNIT/ML SOPN Inject 70 Units into the skin daily. (Patient taking differently: Inject 45 Units into the skin daily. ) 8 pen 0  . Liraglutide (VICTOZA) 18 MG/3ML SOPN Inject 1.8 mg into the skin daily. Around 1500-1600pm    . montelukast (SINGULAIR) 10 MG tablet Take 10 mg by mouth at bedtime.    . Multiple Vitamin (MULITIVITAMIN WITH MINERALS) TABS Take  1 tablet by mouth daily.      Marland Kitchen olmesartan (BENICAR) 40 MG tablet Take 40 mg by mouth daily.    . pantoprazole (PROTONIX) 40 MG tablet Take 40 mg by mouth daily.     . rosuvastatin (CRESTOR) 20 MG tablet Take 20 mg by mouth daily.    Nelva Nay SOLOSTAR 300 UNIT/ML SOPN INJECT 70 UNITS INTO THE SKIN DAILY 5 pen 0  . valACYclovir (VALTREX) 500 MG tablet Take 1,000 mg by mouth daily as needed (flareups/outbreaks).    . venlafaxine (EFFEXOR-XR) 150 MG 24 hr capsule Take 150 mg by mouth daily.      Marland Kitchen HYDROcodone-acetaminophen (NORCO) 5-325 MG tablet Take 1-2 tablets by mouth every 6 (six) hours as needed for moderate pain or severe pain. (Patient  not taking: Reported on 05/19/2016) 30 tablet 0   No current facility-administered medications for this encounter.     Physical Findings:  height is 6' (1.829 m) and weight is 258 lb 6.4 oz (117.2 kg). Her temperature is 98.4 F (36.9 C). Her blood pressure is 143/70 (abnormal) and her pulse is 107 (abnormal). Her oxygen saturation is 100%.  General: Alert and oriented, in no acute distress. HEENT: Oropharynx is clear. Neck: Neck is supple, no palpable cervical or supraclavicular masses. Abdomen: Soft, nontender. Extremities: No edema in arms or ankles. Lymphatics: see Neck Exam Neurologic: No obvious focalities. Speech is fluent.  Psychiatric: Judgment and insight are intact. Affect is appropriate. Breast exam reveals a scar along the right areola which has healed very well. Cosmetic outcome looks very good. Post operative seroma noted.  No palpable masses in the left breast or axilla. MSK: excellent ROM in shoulders   Lab Findings: Lab Results  Component Value Date   WBC 9.3 03/24/2016   HGB 14.6 03/24/2016   HCT 43.0 03/24/2016   MCV 88.1 03/24/2016   PLT 293 03/24/2016    Radiographic Findings: No results found.  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  We discussed adjuvant radiotherapy today.  I recommend 6-7 weeks of radiation in order to reduce the risk of locoregional recurrence.  We discussed different regimens of treatment, including the pros and cons of a boost treatment. However, after weighing the pros and cons, the patient has elected to undergo a 5 week course of treatment without the boost.  She wants as few fractions as reasonably possible. This plan should be sufficient considering the patient's staging and surgical margins; a boost would provide a small benefit for local control but not overall survival.  The risks, benefits and side effects of this treatment were discussed in  detail.  She understands that radiotherapy is associated with skin irritation and fatigue in the acute setting. Late effects can include cosmetic changes and rare injury to internal organs.   She is enthusiastic about proceeding with treatment. A consent form has been discussed, signed, and placed in her chart.  The patient will be scheduled for CT Simulation with intent to begin radiation treatments soon after.  _____________________________________   Eppie Gibson, MD  This document serves as a record of services personally performed by Eppie Gibson, MD. It was created on her behalf by Maryla Morrow, a trained medical scribe. The creation of this record is based on the scribe's personal observations and the provider's statements to them. This document has been checked and approved by the attending provider.

## 2016-05-19 NOTE — Progress Notes (Signed)
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  Receptor Status: ER(80%), PR (20%)  04/20/16 Diagnosis 1. Breast, lumpectomy, Right - DUCTAL CARCINOMA IN SITU, INTERMEDIATE GRADE, SPANNING 1.3 CM. - THE SURGICAL RESECTION MARGINS ARE NEGATIVE FOR CARCINOMA. - SEE ONCOLOGY TABLE BELOW. 2. Breast, excision, Lateral Margin - INTRADUCTAL PAPILLOMA. - FIBROCYSTIC CHANGES WITH CALCIFICATIONS. - THERE IS NO EVIDENCE OF MALIGNANCY  Did patient present with symptoms or was this found on screening mammography?: It was found on a screening mammogram.   Past/Anticipated interventions by surgeon, if any: Dr. Dalbert Batman performed a Lumpectomy on 04/20/16  Past/Anticipated interventions by medical oncology, if any:  Dr. Lindi Adie 04/28/16 Recommendation: 1. Adjuvant radiation therapy 2. Followed by antiestrogen therapy with tamoxifen 5 years  Lymphedema issues, if any:  She denies. She has good arm mobilty  Pain issues, if any: She denies   SAFETY ISSUES:  Prior radiation? 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 [facility])   Pacemaker/ICD? No  Possible current pregnancy? No  Is the patient on methotrexate? No  Current Complaints / other details:    BP (!) 143/70   Pulse (!) 107   Temp 98.4 F (36.9 C)   Ht 6' (1.829 m)   Wt 258 lb 6.4 oz (117.2 kg)   SpO2 100% Comment: room air  BMI 35.05 kg/m    Wt Readings from Last 3 Encounters:  05/19/16 258 lb 6.4 oz (117.2 kg)  04/28/16 260 lb 3.2 oz (118 kg)  04/20/16 257 lb (116.6 kg)

## 2016-05-24 ENCOUNTER — Ambulatory Visit: Payer: Managed Care, Other (non HMO) | Admitting: Radiation Oncology

## 2016-05-24 ENCOUNTER — Ambulatory Visit (INDEPENDENT_AMBULATORY_CARE_PROVIDER_SITE_OTHER): Payer: Managed Care, Other (non HMO) | Admitting: Endocrinology

## 2016-05-24 ENCOUNTER — Encounter: Payer: Self-pay | Admitting: Endocrinology

## 2016-05-24 VITALS — BP 122/80 | HR 92 | Ht 72.0 in | Wt 257.0 lb

## 2016-05-24 DIAGNOSIS — E1165 Type 2 diabetes mellitus with hyperglycemia: Secondary | ICD-10-CM | POA: Diagnosis not present

## 2016-05-24 DIAGNOSIS — Z794 Long term (current) use of insulin: Secondary | ICD-10-CM | POA: Diagnosis not present

## 2016-05-24 NOTE — Progress Notes (Signed)
Patient ID: Destiny Beasley, female   DOB: [birth year], 61 y.o.   MRN: [id number]   Reason for Appointment: Type II Diabetes follow-up   History of Present Illness    Date of diagnosis: 10/2008  Previous history: She had markedly increased blood sugars at diagnosis and was tried on oral hypoglycemic drugs and Victoza for about 9 months before starting an insulin.  Since 2011 she had been on basal bolus insulin regimen Previously had had difficulty controlling her diabetes mostly because of difficulty with compliance with various aspects of self-care.   Recent history:   Insulin regimen: Toujeo 50 units at 8 am . Apidra 20 units at times Oral hypoglycemic drugs:   none  Her A1c has been progressively increased and now 10%        Current management, blood sugar patterns and problems:  She was told to start her new weight loss drugs to help her control her portions and eating behavior.  She did not start this because she thought she wanted to focus on that treatment for her breast cancer  Her main difficulty is taking the mealtime insulin as she does not think about it even though she has had with her all the time  Also she is afraid of hypoglycemia with this.  She has increased her Toujeo to 50 units  she did try to go back to Victoza 1.8 mg and has lost about 3 pounds.  She has had more stress with her breast cancer  She does not but has lost only 1 pound  Difficult to know what her postprandial readings are as she is checking her blood sugars somewhat sporadically and mostly before meals, only a few readings at suppertime and bedtime  She also has not made efforts to go to the gym to exercise recently  Hypoglycemia has occurred only 2 times as a result of taking Apidra later at night  She is not taking any oral hypoglycemic drugs.   Glucose readings from download  Mean values apply above for all meters except median for One Touch  PRE-MEAL Fasting Lunch Dinner  Bedtime Overall  Glucose range: 71-306  86, 233  173-347  51-347  Mean/median:     177    Exercise:Less recently, previously at gym    Dietician visit: Most recent: 04/2010 Weight control:  Wt Readings from Last 3 Encounters:  05/24/16 257 lb (116.6 kg)  05/19/16 258 lb 6.4 oz (117.2 kg)  04/28/16 260 lb 3.2 oz (118 kg)         Diabetes labs:  Lab Results  Component Value Date   HGBA1C 10.0 (H) 05/05/2016   HGBA1C 9.1 (H) 03/22/2016   HGBA1C 8.3 (H) 02/04/2016   Lab Results  Component Value Date   MICROALBUR 0.8 02/04/2016   LDLCALC 79 05/05/2016   CREATININE 1.05 05/05/2016    OBESITY: Thinks she had difficulty with word finding when she was taking Topamax in the Qsym[facility]     No visits with results within 1 Day(s) from this visit.  Latest known visit with results is:  Lab on 05/05/2016  Component Date Value Ref Range Status  . Hgb A1c MFr Bld 05/05/2016 10.0* 4.6 - 6.5 % Final  . Sodium 05/05/2016 140  135 - 145 mEq/L Final  . Potassium 05/05/2016 4.4  3.5 - 5.1 mEq/L Final  . Chloride 05/05/2016 104  96 - 112 mEq/L Final  . CO2 05/05/2016 31  19 - 32 mEq/L Final  . Glucose, Bld  05/05/2016 134* 70 - 99 mg/dL Final  . BUN 05/05/2016 18  6 - 23 mg/dL Final  . Creatinine, Ser 05/05/2016 1.05  0.40 - 1.20 mg/dL Final  . Total Bilirubin 05/05/2016 0.3  0.2 - 1.2 mg/dL Final  . Alkaline Phosphatase 05/05/2016 84  39 - 117 U/L Final  . AST 05/05/2016 21  0 - 37 U/L Final  . ALT 05/05/2016 23  0 - 35 U/L Final  . Total Protein 05/05/2016 7.6  6.0 - 8.3 g/dL Final  . Albumin 05/05/2016 4.1  3.5 - 5.2 g/dL Final  . Calcium 05/05/2016 9.7  8.4 - 10.5 mg/dL Final  . GFR 05/05/2016 69.16  >60.00 mL/min Final  . Cholesterol 05/05/2016 141  0 - 200 mg/dL Final  . Triglycerides 05/05/2016 74.0  0.0 - 149.0 mg/dL Final  . HDL 05/05/2016 48.10  >39.00 mg/dL Final  . VLDL 05/05/2016 14.8  0.0 - 40.0 mg/dL Final  . LDL Cholesterol 05/05/2016 79  0 - 99 mg/dL Final  . Total  CHOL/HDL Ratio 05/05/2016 3   Final  . NonHDL 05/05/2016 93.33   Final     Allergies as of 05/24/2016      Reactions   Invokana [canagliflozin] Other (See Comments)   Constant yeast infections      Medication List       Accurate as of 05/24/16 10:12 AM. Always use your most recent med list.          APIDRA SOLOSTAR 100 UNIT/ML Solostar Pen Generic drug:  Insulin Glulisine INEJCT 15 UNITS INTO THE SKIN IN THE MORNING, 15 UNITS AT LUNCH AND 20 UNITS AT DINNER   aspirin 325 MG EC tablet Take 1 tablet (325 mg total) by mouth daily.   B-D UF III MINI PEN NEEDLES 31G X 5 MM Misc Generic drug:  Insulin Pen Needle USE 4 DAILY   B-D UF III MINI PEN NEEDLES 31G X 5 MM Misc Generic drug:  Insulin Pen Needle USE TO INJECT INSULIN FOUR TIMES DAILY   Biotin 10000 MCG Tabs Take by mouth.   DULERA 100-5 MCG/ACT Aero Generic drug:  mometasone-formoterol Inhale 2 puffs into the lungs 2 (two) times daily as needed for wheezing or shortness of breath (allergies).   glucose blood test strip Commonly known as:  ONETOUCH VERIO USE AS DIRECTED TO CHECK BLOOD SUGARS THREE TIMES DAILY Dx code E11.65   hydrochlorothiazide 25 MG tablet Commonly known as:  HYDRODIURIL Take 25 mg by mouth daily.   HYDROcodone-acetaminophen 5-325 MG tablet Commonly known as:  NORCO Take 1-2 tablets by mouth every 6 (six) hours as needed for moderate pain or severe pain.   Insulin Glargine 300 UNIT/ML Sopn Commonly known as:  TOUJEO SOLOSTAR Inject 70 Units into the skin daily.   TOUJEO SOLOSTAR 300 UNIT/ML Sopn Generic drug:  Insulin Glargine INJECT 70 UNITS INTO THE SKIN DAILY   montelukast 10 MG tablet Commonly known as:  SINGULAIR Take 10 mg by mouth at bedtime.   multivitamin with minerals Tabs tablet Take 1 tablet by mouth daily.   olmesartan 40 MG tablet Commonly known as:  BENICAR Take 40 mg by mouth daily.   pantoprazole 40 MG tablet Commonly known as:  PROTONIX Take 40 mg by mouth  daily.   rosuvastatin 20 MG tablet Commonly known as:  CRESTOR Take 20 mg by mouth daily.   valACYclovir 500 MG tablet Commonly known as:  VALTREX Take 1,000 mg by mouth daily as needed (flareups/outbreaks).   venlafaxine XR  150 MG 24 hr capsule Commonly known as:  EFFEXOR-XR Take 150 mg by mouth daily.   VICTOZA 18 MG/3ML Sopn Generic drug:  liraglutide Inject 1.8 mg into the skin daily. Around 1500-1600pm   Vitamin B-12 2500 MCG Subl Place under the tongue.   VITAMIN D-3 PO Take by mouth.       Allergies:  Allergies  Allergen Reactions  . Invokana [Canagliflozin] Other (See Comments)    Constant yeast infections    Past Medical History:  Diagnosis Date  . Anxiety   . Diabetes mellitus   . GERD (gastroesophageal reflux disease)   . Hypercholesteremia   . Hypertension   . Malignant neoplasm of upper-outer quadrant of right female breast ([place]) 02/24/2016  . Sleep apnea    use CPAP  . Stroke [facility])    TIA - Dec 1 st, 2017    Past Surgical History:  Procedure Laterality Date  . ABDOMINAL HYSTERECTOMY    . BRAIN MENINGIOMA EXCISION  2005  . BREAST LUMPECTOMY WITH RADIOACTIVE SEED LOCALIZATION Right 04/20/2016   Procedure: RIGHT BREAST LUMPECTOMY WITH RADIOACTIVE SEED LOCALIZATION;  Surgeon: Fanny Skates, MD;  Location: [place];  Service: General;  Laterality: Right;  . CESAREAN SECTION    . FOOT SURGERY Bilateral 2016   hammer toe surgery  . MENISCUS REPAIR Left 2015  . PITUITARY SURGERY    . THYROID SURGERY      Family History  Problem Relation Age of Onset  . Diabetes Mother   . Hyperlipidemia Mother   . Hypertension Mother     Social History:  reports that she has never smoked. She has never used smokeless tobacco. She reports that she drinks about 0.6 oz of alcohol per week . She reports that she does not use drugs.  Review of Systems:  Hypertension:   is taking Benicar HCT/amlodipine, Followed by PCP   Lipids: Taking  with  adequate control of LDL, normal triglycerides  Lab Results  Component Value Date   CHOL 141 05/05/2016   HDL 48.10 05/05/2016   LDLCALC 79 05/05/2016   TRIG 74.0 05/05/2016   CHOLHDL 3 05/05/2016    Diabetic shoe prescription has been given previously   Examination:   BP 122/80   Pulse 92   Ht 6' (1.829 m)   Wt 257 lb (116.6 kg)   SpO2 96%   BMI 34.86 kg/m   Body mass index is 34.86 kg/m.     ASSESSMENT/ PLAN:    Diabetes type 2 with obesity See history of present illness for detailed discussion of current management, blood sugar patterns and problems identified  Her A1c is Progressively going up and now 10% Has not being able to watch her diet again and has been reluctant to take prescribed weight loss medications on 2 occasions for various reasons She cannot remember to take her mealtime insulin and tends to have high postprandial readings that are significant She does need mealtime insulin as sugars are not controlled at all with basal insulin and Victoza She is also not able to control her diet  Recommendations today:   Showed her the V-go pump today and explained to her how this works as well as how it will be used to do her basal bolus insulin regimen She is very interested in trying this as she thinks she can be better compliant with her mealtime boluses with this compared to Humalog injections Given her a brochure on this and will get her insurance verification before starting  Counseling time on subjects discussed above is over 50% of today's 25 minute visit   There are no Patient Instructions on file for this visit. Tomasz Steeves 05/24/2016, 10:12 AM

## 2016-05-26 ENCOUNTER — Ambulatory Visit
Admission: RE | Admit: 2016-05-26 | Discharge: 2016-05-26 | Disposition: A | Discharge status: Home / Self Care | Payer: Managed Care, Other (non HMO) | Source: Ambulatory Visit | Attending: Radiation Oncology | Admitting: Radiation Oncology

## 2016-05-26 ENCOUNTER — Telehealth: Payer: Self-pay

## 2016-05-26 DIAGNOSIS — Z170 Estrogen receptor positive status [ER+]: Principal | ICD-10-CM

## 2016-05-26 DIAGNOSIS — Z510 Encounter for antineoplastic radiation therapy: Secondary | ICD-10-CM | POA: Diagnosis not present

## 2016-05-26 DIAGNOSIS — C50411 Malignant neoplasm of upper-outer quadrant of right female breast: Secondary | ICD-10-CM

## 2016-05-26 NOTE — Progress Notes (Signed)
  Radiation Oncology         ([phone]) [phone] ________________________________  Name: Destiny Beasley MRN: [id number]  Date: 05/26/2016  DOB: 1955/11/30  SIMULATION AND TREATMENT PLANNING NOTE    Outpatient  DIAGNOSIS:     ICD-9-CM ICD-10-CM   1. Malignant neoplasm of upper-outer quadrant of right breast in female, estrogen receptor positive ([place]) 174.4 C50.411    V86.0 Z17.0     NARRATIVE:  The patient was brought to the [facility].  Identity was confirmed.  All relevant records and images related to the planned course of therapy were reviewed.  The patient freely provided informed written consent to proceed with treatment after reviewing the details related to the planned course of therapy. The consent form was witnessed and verified by the simulation staff.    Then, the patient was set-up in a stable reproducible supine position for radiation therapy with her ipsilateral arm over her head, and her upper body secured in a custom-made Vac-lok device.  CT images were obtained.  Surface markings were placed.  The CT images were loaded into the planning software.    TREATMENT PLANNING NOTE: Treatment planning then occurred.  The radiation prescription was entered and confirmed.     A total of 3 medically necessary complex treatment devices were fabricated and supervised by me: 2 fields with MLCs for custom blocks to protect heart, and lungs;  and, a Vac-lok. MORE COMPLEX DEVICES MAY BE MADE IN DOSIMETRY FOR FIELD IN FIELD BEAMS FOR DOSE HOMOGENEITY.  I have requested : 3D Simulation which is medically necessary to give adequate dose to at risk tissues while sparing lungs and heart.  I have requested a DVH of the following structures: lungs, heart, lumpectomy cavity.    The patient will receive 50 Gy in 25 fractions to the right breast with 2 tangential fields.   This will not be followed by a boost.  Optical Surface Tracking Plan:  Since intensity modulated radiotherapy (IMRT) and  3D conformal radiation treatment methods are predicated on accurate and precise positioning for treatment, intrafraction motion monitoring is medically necessary to ensure accurate and safe treatment delivery. The ability to quantify intrafraction motion without excessive ionizing radiation dose can only be performed with optical surface tracking. Accordingly, surface imaging offers the opportunity to obtain 3D measurements of patient position throughout IMRT and 3D treatments without excessive radiation exposure. I am ordering optical surface tracking for this patient's upcoming course of radiotherapy.  ________________________________   Reference:  Ursula Alert, J, et al. Surface imaging-based analysis of intrafraction motion for breast radiotherapy patients.Journal of [place], n. 6, nov. 2014. ISSN [id number].  Available at: <http://www.jacmp.org/index.php/jacmp/article/view/4957>.    -----------------------------------  Eppie Gibson, MD

## 2016-05-26 NOTE — Telephone Encounter (Signed)
Representative from v-go called to state patient is covered and they will be sending her a co-pay card and calling her with the rest of the details

## 2016-05-29 ENCOUNTER — Other Ambulatory Visit: Payer: Self-pay | Admitting: Endocrinology

## 2016-05-29 DIAGNOSIS — E1165 Type 2 diabetes mellitus with hyperglycemia: Secondary | ICD-10-CM

## 2016-05-29 DIAGNOSIS — Z794 Long term (current) use of insulin: Secondary | ICD-10-CM

## 2016-05-30 ENCOUNTER — Telehealth: Payer: Self-pay | Admitting: Hematology and Oncology

## 2016-05-30 DIAGNOSIS — Z510 Encounter for antineoplastic radiation therapy: Secondary | ICD-10-CM | POA: Diagnosis not present

## 2016-05-30 NOTE — Telephone Encounter (Signed)
lvm to inform pt of follow up appt 3/16 at 0930 per LOS

## 2016-05-31 ENCOUNTER — Encounter: Payer: Managed Care, Other (non HMO) | Attending: Endocrinology | Admitting: Nutrition

## 2016-05-31 DIAGNOSIS — E1165 Type 2 diabetes mellitus with hyperglycemia: Secondary | ICD-10-CM | POA: Insufficient documentation

## 2016-05-31 DIAGNOSIS — Z713 Dietary counseling and surveillance: Secondary | ICD-10-CM | POA: Diagnosis not present

## 2016-05-31 DIAGNOSIS — Z794 Long term (current) use of insulin: Secondary | ICD-10-CM | POA: Diagnosis not present

## 2016-05-31 DIAGNOSIS — E119 Type 2 diabetes mellitus without complications: Secondary | ICD-10-CM

## 2016-06-01 ENCOUNTER — Telehealth: Payer: Self-pay | Admitting: Endocrinology

## 2016-06-01 NOTE — Telephone Encounter (Signed)
Pt called in and said that [facility] is sending [place] a PA for her medication and that she wants to make sure this gets done. Be advised.

## 2016-06-02 ENCOUNTER — Ambulatory Visit: Payer: Managed Care, Other (non HMO) | Admitting: Radiation Oncology

## 2016-06-05 ENCOUNTER — Ambulatory Visit: Payer: Managed Care, Other (non HMO)

## 2016-06-05 ENCOUNTER — Ambulatory Visit
Admission: RE | Admit: 2016-06-05 | Payer: Managed Care, Other (non HMO) | Source: Ambulatory Visit | Admitting: Radiation Oncology

## 2016-06-05 NOTE — Telephone Encounter (Signed)
PA received via fax for meds, in progress

## 2016-06-05 NOTE — Telephone Encounter (Signed)
Attempted to reach the patient and verify which medication needed a PA. Patient was not available. Will try again at a later time.

## 2016-06-06 ENCOUNTER — Ambulatory Visit: Payer: Managed Care, Other (non HMO)

## 2016-06-06 ENCOUNTER — Ambulatory Visit: Payer: Managed Care, Other (non HMO) | Admitting: Endocrinology

## 2016-06-07 ENCOUNTER — Ambulatory Visit: Payer: Managed Care, Other (non HMO)

## 2016-06-08 ENCOUNTER — Ambulatory Visit: Payer: Managed Care, Other (non HMO)

## 2016-06-09 ENCOUNTER — Ambulatory Visit: Payer: Managed Care, Other (non HMO)

## 2016-06-12 ENCOUNTER — Ambulatory Visit: Admission: RE | Admit: 2016-06-12 | Payer: Managed Care, Other (non HMO) | Source: Ambulatory Visit

## 2016-06-12 ENCOUNTER — Telehealth: Payer: Self-pay

## 2016-06-12 NOTE — Telephone Encounter (Signed)
I left a voice mail today with Destiny Beasley regarding her scheduled radiation treatments. I had spoken with her last week regarding her scheduled radiation treatments which had been missed. She reported to me that she had been called on an emergency business trip and stated that she would call me Friday 06/09/16 with an update regarding her schedule. She has not called me with an update at this point. She missed her treatment today. I asked her for a return call back with an update on her status. LINAC 1 has been updated to this point.

## 2016-06-13 ENCOUNTER — Telehealth: Payer: Self-pay | Admitting: Endocrinology

## 2016-06-13 ENCOUNTER — Ambulatory Visit
Admission: RE | Admit: 2016-06-13 | Discharge: 2016-06-13 | Disposition: A | Discharge status: Home / Self Care | Payer: Managed Care, Other (non HMO) | Source: Ambulatory Visit | Attending: Radiation Oncology | Admitting: Radiation Oncology

## 2016-06-13 ENCOUNTER — Ambulatory Visit: Payer: Managed Care, Other (non HMO) | Admitting: Radiation Oncology

## 2016-06-13 DIAGNOSIS — Z510 Encounter for antineoplastic radiation therapy: Secondary | ICD-10-CM | POA: Insufficient documentation

## 2016-06-13 DIAGNOSIS — Z170 Estrogen receptor positive status [ER+]: Secondary | ICD-10-CM | POA: Insufficient documentation

## 2016-06-13 DIAGNOSIS — D0511 Intraductal carcinoma in situ of right breast: Secondary | ICD-10-CM | POA: Insufficient documentation

## 2016-06-13 NOTE — Telephone Encounter (Signed)
This has been approved patient has been notified

## 2016-06-13 NOTE — Telephone Encounter (Signed)
Pt called in regarding the prescription for Victoza, she said that she hasn't taken it in almost a month and needs to know what Dr. Dwyane Dee wants to do. Please call patient.

## 2016-06-13 NOTE — Telephone Encounter (Signed)
Patient stated he insurance need a PA of Medical Necessity for medication Victoza, [phone]

## 2016-06-14 ENCOUNTER — Ambulatory Visit
Admission: RE | Admit: 2016-06-14 | Discharge: 2016-06-14 | Disposition: A | Discharge status: Home / Self Care | Payer: Managed Care, Other (non HMO) | Source: Ambulatory Visit | Attending: Radiation Oncology | Admitting: Radiation Oncology

## 2016-06-14 DIAGNOSIS — Z510 Encounter for antineoplastic radiation therapy: Secondary | ICD-10-CM | POA: Diagnosis not present

## 2016-06-15 ENCOUNTER — Ambulatory Visit
Admission: RE | Admit: 2016-06-15 | Discharge: 2016-06-15 | Disposition: A | Discharge status: Home / Self Care | Payer: Managed Care, Other (non HMO) | Source: Ambulatory Visit | Attending: Radiation Oncology | Admitting: Radiation Oncology

## 2016-06-15 DIAGNOSIS — Z170 Estrogen receptor positive status [ER+]: Secondary | ICD-10-CM | POA: Insufficient documentation

## 2016-06-15 DIAGNOSIS — Z510 Encounter for antineoplastic radiation therapy: Secondary | ICD-10-CM | POA: Diagnosis not present

## 2016-06-15 DIAGNOSIS — C50411 Malignant neoplasm of upper-outer quadrant of right female breast: Secondary | ICD-10-CM | POA: Insufficient documentation

## 2016-06-15 MED ORDER — ALRA NON-METALLIC DEODORANT (RAD-ONC)
1.0000 "application " | Freq: Once | TOPICAL | Status: AC
  Administered 2016-06-15: 1 via TOPICAL

## 2016-06-15 MED ORDER — RADIAPLEXRX EX GEL
Freq: Once | CUTANEOUS | Status: AC
  Administered 2016-06-15: 09:00:00 via TOPICAL

## 2016-06-15 NOTE — Patient Instructions (Signed)
Fill and apply a new v-go every 24 hours.  Give 4-5 button presses before breakfast and lunch, and 5-6 button presses before supper. Test blood sugars before meals and at bedtime Call blood sugar readings in on Friday.

## 2016-06-15 NOTE — Progress Notes (Signed)
Pt. Was trained on how to fill, apply and use the V-Go 40.  She filled a demo V-Go, without any difficulty.  She was told to start this tonight because she had take the [place].   She was instructed to give 4-5 button presses before breakfast and lunch, and 5-6 before supper.   We discussed how this V-go delivers the insulin and that there are only 18 button presses to use before meals.  She was told that if she needed more meal time insulin, that she will have to give an injection.  She agreed to do this, and had no final questions.   She was given a record log for blood sugar readings, and told to test before meals and at bedtime.   She was told to call in blood sugar readings to the office on Friday She will call if questions.

## 2016-06-16 ENCOUNTER — Ambulatory Visit
Admission: RE | Admit: 2016-06-16 | Discharge: 2016-06-16 | Disposition: A | Discharge status: Home / Self Care | Payer: Managed Care, Other (non HMO) | Source: Ambulatory Visit | Attending: Radiation Oncology | Admitting: Radiation Oncology

## 2016-06-16 ENCOUNTER — Ambulatory Visit: Payer: Managed Care, Other (non HMO) | Admitting: Endocrinology

## 2016-06-16 DIAGNOSIS — Z510 Encounter for antineoplastic radiation therapy: Secondary | ICD-10-CM | POA: Diagnosis not present

## 2016-06-19 ENCOUNTER — Encounter: Payer: Self-pay | Admitting: Radiation Oncology

## 2016-06-19 ENCOUNTER — Ambulatory Visit
Admission: RE | Admit: 2016-06-19 | Discharge: 2016-06-19 | Disposition: A | Discharge status: Home / Self Care | Payer: Managed Care, Other (non HMO) | Source: Ambulatory Visit | Attending: Radiation Oncology | Admitting: Radiation Oncology

## 2016-06-19 VITALS — BP 152/82 | HR 80 | Temp 98.6°F | Ht 72.0 in | Wt 267.2 lb

## 2016-06-19 DIAGNOSIS — Z510 Encounter for antineoplastic radiation therapy: Secondary | ICD-10-CM | POA: Diagnosis not present

## 2016-06-19 DIAGNOSIS — Z170 Estrogen receptor positive status [ER+]: Principal | ICD-10-CM

## 2016-06-19 DIAGNOSIS — C50411 Malignant neoplasm of upper-outer quadrant of right female breast: Secondary | ICD-10-CM

## 2016-06-19 NOTE — Progress Notes (Signed)
Destiny Beasley presents for her 4th fraction of radiation to her Right Breast. She denies pain or fatigue. She does have intermittent sharp shooting pains to her Right Breast. She denies skin changes at this time. She is using Radiaplex twice daily as directed.   BP (!) 152/82   Pulse 80   Temp 98.6 F (37 C)   Ht 6' (1.829 m)   Wt 267 lb 3.2 oz (121.2 kg)   SpO2 100% Comment: room air  BMI 36.24 kg/m    Wt Readings from Last 3 Encounters:  06/19/16 267 lb 3.2 oz (121.2 kg)  05/24/16 257 lb (116.6 kg)  05/19/16 258 lb 6.4 oz (117.2 kg)

## 2016-06-19 NOTE — Progress Notes (Signed)
   Weekly Management Note:  Outpatient    ICD-9-CM ICD-10-CM   1. Malignant neoplasm of upper-outer quadrant of right breast in female, estrogen receptor positive ([facility]) 174.4 C50.411    V86.0 Z17.0     Current Dose:  8 Gy  Projected Dose: 50 Gy   Narrative:  The patient presents for routine under treatment assessment.  CBCT/MVCT images/Port film x-rays were reviewed.  The chart was checked. She was on an emergency work trip which delayed her start to RT.  She plans to stay in town for the next several weeks.  Physical Findings:  height is 6' (1.829 m) and weight is 267 lb 3.2 oz (121.2 kg). Her temperature is 98.6 F (37 C). Her blood pressure is 152/82 (abnormal) and her pulse is 80. Her oxygen saturation is 100%.   Wt Readings from Last 3 Encounters:  06/19/16 267 lb 3.2 oz (121.2 kg)  05/24/16 257 lb (116.6 kg)  05/19/16 258 lb 6.4 oz (117.2 kg)   NAD, mild hyperpigmentation of right breast  Impression:  The patient is tolerating radiotherapy.  Plan:  Continue radiotherapy as planned. Patient instructed to apply Radiplex to intact skin in treatment fields.  Urged to stay in town and receive consecutive treatments for efficacy  I am happy to advocate on her behalf with her boss at work if needed   ________________________________   Eppie Gibson, M.D.

## 2016-06-19 NOTE — Progress Notes (Signed)
Pt here for patient teaching.  Pt given Radiation and You booklet, skin care instructions, Alra deodorant and Radiaplex gel.  Reviewed areas of pertinence such as fatigue, skin changes, breast tenderness and breast swelling . Pt able to give teach back of to pat skin, use baby wipes and drink plenty of water,apply Radiaplex bid, avoid applying anything to skin within 4 hours of treatment, avoid wearing an under wire bra and to use an electric razor if they must shave. Pt verbalizes understanding of information given and will contact nursing with any questions or concerns.     Http://rtanswers.org/treatmentinformation/whattoexpect/index      

## 2016-06-20 ENCOUNTER — Ambulatory Visit
Admission: RE | Admit: 2016-06-20 | Discharge: 2016-06-20 | Disposition: A | Discharge status: Home / Self Care | Payer: Managed Care, Other (non HMO) | Source: Ambulatory Visit | Attending: Radiation Oncology | Admitting: Radiation Oncology

## 2016-06-20 ENCOUNTER — Telehealth: Payer: Self-pay | Admitting: Nutrition

## 2016-06-20 DIAGNOSIS — Z510 Encounter for antineoplastic radiation therapy: Secondary | ICD-10-CM | POA: Diagnosis not present

## 2016-06-20 NOTE — Telephone Encounter (Signed)
Pt. Reported that she tried the V-go for 6 days, but that the blood sugars were "way too high".  Said she ran out of button presses before the end of the day, and so decided to just "go back on injections.   Said blood sugars are now in the 100s most of the time.  Was not at home to give me readings.

## 2016-06-20 NOTE — Telephone Encounter (Signed)
Please schedule her follow-up with labs in about 2 months

## 2016-06-21 ENCOUNTER — Ambulatory Visit
Admission: RE | Admit: 2016-06-21 | Discharge: 2016-06-21 | Disposition: A | Discharge status: Home / Self Care | Payer: Managed Care, Other (non HMO) | Source: Ambulatory Visit | Attending: Radiation Oncology | Admitting: Radiation Oncology

## 2016-06-21 DIAGNOSIS — Z510 Encounter for antineoplastic radiation therapy: Secondary | ICD-10-CM | POA: Diagnosis not present

## 2016-06-22 ENCOUNTER — Ambulatory Visit: Payer: Managed Care, Other (non HMO) | Admitting: Endocrinology

## 2016-06-22 ENCOUNTER — Ambulatory Visit
Admission: RE | Admit: 2016-06-22 | Discharge: 2016-06-22 | Disposition: A | Discharge status: Home / Self Care | Payer: Managed Care, Other (non HMO) | Source: Ambulatory Visit | Attending: Radiation Oncology | Admitting: Radiation Oncology

## 2016-06-22 DIAGNOSIS — Z510 Encounter for antineoplastic radiation therapy: Secondary | ICD-10-CM | POA: Diagnosis not present

## 2016-06-23 ENCOUNTER — Ambulatory Visit: Payer: Managed Care, Other (non HMO)

## 2016-06-26 ENCOUNTER — Ambulatory Visit
Admission: RE | Admit: 2016-06-26 | Discharge: 2016-06-26 | Disposition: A | Discharge status: Home / Self Care | Payer: Managed Care, Other (non HMO) | Source: Ambulatory Visit | Attending: Radiation Oncology | Admitting: Radiation Oncology

## 2016-06-26 VITALS — BP 144/82 | HR 85 | Temp 98.4°F | Resp 18 | Wt 262.4 lb

## 2016-06-26 DIAGNOSIS — Z510 Encounter for antineoplastic radiation therapy: Secondary | ICD-10-CM | POA: Diagnosis not present

## 2016-06-26 DIAGNOSIS — C50411 Malignant neoplasm of upper-outer quadrant of right female breast: Secondary | ICD-10-CM

## 2016-06-26 DIAGNOSIS — Z170 Estrogen receptor positive status [ER+]: Principal | ICD-10-CM

## 2016-06-26 NOTE — Progress Notes (Signed)
   Weekly Management Note:  Outpatient    ICD-9-CM ICD-10-CM   1. Malignant neoplasm of upper-outer quadrant of right breast in female, estrogen receptor positive ([facility]) 174.4 C50.411    V86.0 Z17.0     Current Dose:  16 Gy  Projected Dose: 50 Gy   Narrative:  The patient presents for routine under treatment assessment.  CBCT/MVCT images/Port film x-rays were reviewed.  The chart was checked. Applies radiaplex, no concerns  Physical Findings:  weight is 262 lb 6.4 oz (119 kg). Her oral temperature is 98.4 F (36.9 C). Her blood pressure is 144/82 (abnormal) and her pulse is 85. Her respiration is 18 and oxygen saturation is 100%.   Wt Readings from Last 3 Encounters:  06/26/16 262 lb 6.4 oz (119 kg)  06/19/16 267 lb 3.2 oz (121.2 kg)  05/24/16 257 lb (116.6 kg)   NAD, mild hyperpigmentation of right breast Skin intact  Impression:  The patient is tolerating radiotherapy.  Plan:  Continue radiotherapy as planned. Patient instructed to apply Radiplex to intact skin in treatment fields.    ________________________________   Eppie Gibson, M.D.

## 2016-06-26 NOTE — Progress Notes (Signed)
Destiny Beasley present today for 8th fraction to right breast.  Denies pain, and no fatigue.  Hyperpigmentation to right breast and axillary.  Using Radiplex to right breast.    BP (!) 144/82 (BP Location: Left Arm, Patient Position: Sitting)   Pulse 85   Temp 98.4 F (36.9 C) (Oral)   Resp 18   Wt 262 lb 6.4 oz (119 kg)   SpO2 100%   BMI 35.59 kg/m    Wt Readings from Last 3 Encounters:  06/26/16 262 lb 6.4 oz (119 kg)  06/19/16 267 lb 3.2 oz (121.2 kg)  05/24/16 257 lb (116.6 kg)

## 2016-06-27 ENCOUNTER — Ambulatory Visit
Admission: RE | Admit: 2016-06-27 | Discharge: 2016-06-27 | Disposition: A | Discharge status: Home / Self Care | Payer: Managed Care, Other (non HMO) | Source: Ambulatory Visit | Attending: Radiation Oncology | Admitting: Radiation Oncology

## 2016-06-27 DIAGNOSIS — Z510 Encounter for antineoplastic radiation therapy: Secondary | ICD-10-CM | POA: Diagnosis not present

## 2016-06-28 ENCOUNTER — Ambulatory Visit
Admission: RE | Admit: 2016-06-28 | Discharge: 2016-06-28 | Disposition: A | Discharge status: Home / Self Care | Payer: Managed Care, Other (non HMO) | Source: Ambulatory Visit | Attending: Radiation Oncology | Admitting: Radiation Oncology

## 2016-06-28 DIAGNOSIS — Z510 Encounter for antineoplastic radiation therapy: Secondary | ICD-10-CM | POA: Diagnosis not present

## 2016-06-29 ENCOUNTER — Inpatient Hospital Stay: Admission: RE | Admit: 2016-06-29 | Payer: Self-pay | Source: Ambulatory Visit | Admitting: Radiation Oncology

## 2016-06-29 ENCOUNTER — Ambulatory Visit
Admission: RE | Admit: 2016-06-29 | Discharge: 2016-06-29 | Disposition: A | Discharge status: Home / Self Care | Payer: Managed Care, Other (non HMO) | Source: Ambulatory Visit | Attending: Radiation Oncology | Admitting: Radiation Oncology

## 2016-06-29 DIAGNOSIS — Z510 Encounter for antineoplastic radiation therapy: Secondary | ICD-10-CM | POA: Diagnosis not present

## 2016-06-30 ENCOUNTER — Ambulatory Visit
Admission: RE | Admit: 2016-06-30 | Discharge: 2016-06-30 | Disposition: A | Discharge status: Home / Self Care | Payer: Managed Care, Other (non HMO) | Source: Ambulatory Visit | Attending: Radiation Oncology | Admitting: Radiation Oncology

## 2016-06-30 DIAGNOSIS — Z510 Encounter for antineoplastic radiation therapy: Secondary | ICD-10-CM | POA: Diagnosis not present

## 2016-07-03 ENCOUNTER — Ambulatory Visit
Admission: RE | Admit: 2016-07-03 | Discharge: 2016-07-03 | Disposition: A | Discharge status: Home / Self Care | Payer: Managed Care, Other (non HMO) | Source: Ambulatory Visit | Attending: Radiation Oncology | Admitting: Radiation Oncology

## 2016-07-03 VITALS — BP 135/82 | HR 84 | Temp 98.0°F | Resp 18 | Wt 254.2 lb

## 2016-07-03 DIAGNOSIS — Z510 Encounter for antineoplastic radiation therapy: Secondary | ICD-10-CM | POA: Diagnosis not present

## 2016-07-03 DIAGNOSIS — C50411 Malignant neoplasm of upper-outer quadrant of right female breast: Secondary | ICD-10-CM

## 2016-07-03 DIAGNOSIS — Z170 Estrogen receptor positive status [ER+]: Principal | ICD-10-CM

## 2016-07-03 NOTE — Progress Notes (Signed)
   Weekly Management Note:  Outpatient    ICD-9-CM ICD-10-CM   1. Malignant neoplasm of upper-outer quadrant of right breast in female, estrogen receptor positive ([facility]) 174.4 C50.411    V86.0 Z17.0     Current Dose:  26Gy  Projected Dose: 50 Gy   Narrative:  The patient presents for routine under treatment assessment.  CBCT/MVCT images/Port film x-rays were reviewed.  The chart was checked. Applies radiaplex, no concerns  Losing weight due to calorie counting.  Feels well    Physical Findings:  weight is 254 lb 3.2 oz (115.3 kg). Her oral temperature is 98 F (36.7 C). Her blood pressure is 135/82 and her pulse is 84. Her respiration is 18 and oxygen saturation is 100%.   Wt Readings from Last 3 Encounters:  07/03/16 254 lb 3.2 oz (115.3 kg)  06/26/16 262 lb 6.4 oz (119 kg)  06/19/16 267 lb 3.2 oz (121.2 kg)   NAD, moderate hyperpigmentation of right breast Skin intact  Impression:  The patient is tolerating radiotherapy.  Plan:  Continue radiotherapy as planned. Patient instructed to apply Radiplex to intact skin in treatment fields.    ________________________________   Eppie Gibson, M.D.

## 2016-07-03 NOTE — Progress Notes (Signed)
Destiny Beasley completed 13th fraction to right breast today.  Denies any pain.  Reports experienced some fatigue this past weekend.  Appetite is good.  Patient reports she is counting calories which might contribute to weight loss.    Patient reports using radiaplex for skin.  Skin is intact and little to no hyperpigmentation.  Vitals:   07/03/16 0838  BP: 135/82  Pulse: 84  Resp: 18  Temp: 98 F (36.7 C)  TempSrc: Oral  SpO2: 100%  Weight: 254 lb 3.2 oz (115.3 kg)    Wt Readings from Last 3 Encounters:  07/03/16 254 lb 3.2 oz (115.3 kg)  06/26/16 262 lb 6.4 oz (119 kg)  06/19/16 267 lb 3.2 oz (121.2 kg)

## 2016-07-04 ENCOUNTER — Ambulatory Visit
Admission: RE | Admit: 2016-07-04 | Discharge: 2016-07-04 | Disposition: A | Discharge status: Home / Self Care | Payer: Managed Care, Other (non HMO) | Source: Ambulatory Visit | Attending: Radiation Oncology | Admitting: Radiation Oncology

## 2016-07-04 ENCOUNTER — Telehealth: Payer: Self-pay | Admitting: Hematology and Oncology

## 2016-07-04 DIAGNOSIS — Z510 Encounter for antineoplastic radiation therapy: Secondary | ICD-10-CM | POA: Diagnosis not present

## 2016-07-04 NOTE — Telephone Encounter (Signed)
Patient called to cancel follow up appointment per radiation treatment has been extended. Will reschedule at a later time.

## 2016-07-05 ENCOUNTER — Ambulatory Visit
Admission: RE | Admit: 2016-07-05 | Discharge: 2016-07-05 | Disposition: A | Discharge status: Home / Self Care | Payer: Managed Care, Other (non HMO) | Source: Ambulatory Visit | Attending: Radiation Oncology | Admitting: Radiation Oncology

## 2016-07-05 DIAGNOSIS — Z510 Encounter for antineoplastic radiation therapy: Secondary | ICD-10-CM | POA: Diagnosis not present

## 2016-07-06 ENCOUNTER — Ambulatory Visit
Admission: RE | Admit: 2016-07-06 | Discharge: 2016-07-06 | Disposition: A | Discharge status: Home / Self Care | Payer: Managed Care, Other (non HMO) | Source: Ambulatory Visit | Attending: Radiation Oncology | Admitting: Radiation Oncology

## 2016-07-06 DIAGNOSIS — Z510 Encounter for antineoplastic radiation therapy: Secondary | ICD-10-CM | POA: Diagnosis not present

## 2016-07-07 ENCOUNTER — Ambulatory Visit
Admission: RE | Admit: 2016-07-07 | Discharge: 2016-07-07 | Disposition: A | Discharge status: Home / Self Care | Payer: Managed Care, Other (non HMO) | Source: Ambulatory Visit | Attending: Radiation Oncology | Admitting: Radiation Oncology

## 2016-07-07 ENCOUNTER — Ambulatory Visit: Payer: Managed Care, Other (non HMO) | Admitting: Hematology and Oncology

## 2016-07-07 ENCOUNTER — Ambulatory Visit: Payer: Managed Care, Other (non HMO)

## 2016-07-07 DIAGNOSIS — Z510 Encounter for antineoplastic radiation therapy: Secondary | ICD-10-CM | POA: Diagnosis not present

## 2016-07-10 ENCOUNTER — Ambulatory Visit
Admission: RE | Admit: 2016-07-10 | Discharge: 2016-07-10 | Disposition: A | Discharge status: Home / Self Care | Payer: Managed Care, Other (non HMO) | Source: Ambulatory Visit | Attending: Radiation Oncology | Admitting: Radiation Oncology

## 2016-07-10 ENCOUNTER — Ambulatory Visit: Payer: Managed Care, Other (non HMO)

## 2016-07-10 ENCOUNTER — Encounter: Payer: Self-pay | Admitting: Radiation Oncology

## 2016-07-10 VITALS — BP 125/63 | HR 85 | Temp 98.7°F | Ht 72.0 in | Wt 253.6 lb

## 2016-07-10 DIAGNOSIS — Z170 Estrogen receptor positive status [ER+]: Secondary | ICD-10-CM | POA: Diagnosis not present

## 2016-07-10 DIAGNOSIS — C50411 Malignant neoplasm of upper-outer quadrant of right female breast: Secondary | ICD-10-CM | POA: Insufficient documentation

## 2016-07-10 DIAGNOSIS — Z923 Personal history of irradiation: Secondary | ICD-10-CM | POA: Insufficient documentation

## 2016-07-10 DIAGNOSIS — Z510 Encounter for antineoplastic radiation therapy: Secondary | ICD-10-CM | POA: Diagnosis not present

## 2016-07-10 MED ORDER — RADIAPLEXRX EX GEL
Freq: Once | CUTANEOUS | Status: AC
  Administered 2016-07-10: 09:00:00 via TOPICAL

## 2016-07-10 NOTE — Progress Notes (Signed)
Destiny Beasley presents for her 18th fraction of radiation to her Right Breast. She denies pain or fatigue. Her Right Axilla area is hyperpigmented. She continues to use Radiaplex twice daily. She has no other concerns at this time.   BP 125/63   Pulse 85   Temp 98.7 F (37.1 C)   Ht 6' (1.829 m)   Wt 253 lb 9.6 oz (115 kg)   SpO2 100% Comment: room air  BMI 34.39 kg/m    Wt Readings from Last 3 Encounters:  07/10/16 253 lb 9.6 oz (115 kg)  07/03/16 254 lb 3.2 oz (115.3 kg)  06/26/16 262 lb 6.4 oz (119 kg)

## 2016-07-10 NOTE — Progress Notes (Signed)
   Weekly Management Note:  Outpatient    ICD-9-CM ICD-10-CM   1. Malignant neoplasm of upper-outer quadrant of right breast in female, estrogen receptor positive ([facility]) 174.4 C50.411 hyaluronate sodium (RADIAPLEXRX) gel   V86.0 Z17.0     Current Dose:  36 Gy  Projected Dose: 50 Gy   Narrative:  The patient presents for routine under treatment assessment.  CBCT/MVCT images/Port film x-rays were reviewed.  The chart was checked. Applies radiaplex, no concerns Feels well   Physical Findings:  height is 6' (1.829 m) and weight is 253 lb 9.6 oz (115 kg). Her temperature is 98.7 F (37.1 C). Her blood pressure is 125/63 and her pulse is 85. Her oxygen saturation is 100%.   Wt Readings from Last 3 Encounters:  07/10/16 253 lb 9.6 oz (115 kg)  07/03/16 254 lb 3.2 oz (115.3 kg)  06/26/16 262 lb 6.4 oz (119 kg)   NAD, moderate hyperpigmentation of right breast Skin intact  Impression:  The patient is tolerating radiotherapy.  Plan:  Continue radiotherapy as planned. Patient instructed to apply Radiplex to intact skin in treatment fields.    ________________________________   Eppie Gibson, M.D.

## 2016-07-11 ENCOUNTER — Ambulatory Visit
Admission: RE | Admit: 2016-07-11 | Discharge: 2016-07-11 | Disposition: A | Discharge status: Home / Self Care | Payer: Managed Care, Other (non HMO) | Source: Ambulatory Visit | Attending: Radiation Oncology | Admitting: Radiation Oncology

## 2016-07-11 ENCOUNTER — Telehealth: Payer: Self-pay | Admitting: Hematology and Oncology

## 2016-07-11 ENCOUNTER — Ambulatory Visit: Payer: Managed Care, Other (non HMO)

## 2016-07-11 DIAGNOSIS — Z510 Encounter for antineoplastic radiation therapy: Secondary | ICD-10-CM | POA: Diagnosis not present

## 2016-07-11 NOTE — Telephone Encounter (Signed)
Unable to reach patient. Left message with appt date and time and sent letter.

## 2016-07-12 ENCOUNTER — Ambulatory Visit
Admission: RE | Admit: 2016-07-12 | Discharge: 2016-07-12 | Disposition: A | Discharge status: Home / Self Care | Payer: Managed Care, Other (non HMO) | Source: Ambulatory Visit | Attending: Radiation Oncology | Admitting: Radiation Oncology

## 2016-07-12 DIAGNOSIS — Z510 Encounter for antineoplastic radiation therapy: Secondary | ICD-10-CM | POA: Diagnosis not present

## 2016-07-13 ENCOUNTER — Ambulatory Visit
Admission: RE | Admit: 2016-07-13 | Discharge: 2016-07-13 | Disposition: A | Discharge status: Home / Self Care | Payer: Managed Care, Other (non HMO) | Source: Ambulatory Visit | Attending: Radiation Oncology | Admitting: Radiation Oncology

## 2016-07-13 DIAGNOSIS — Z510 Encounter for antineoplastic radiation therapy: Secondary | ICD-10-CM | POA: Diagnosis not present

## 2016-07-14 ENCOUNTER — Ambulatory Visit
Admission: RE | Admit: 2016-07-14 | Discharge: 2016-07-14 | Disposition: A | Discharge status: Home / Self Care | Payer: Managed Care, Other (non HMO) | Source: Ambulatory Visit | Attending: Radiation Oncology | Admitting: Radiation Oncology

## 2016-07-14 DIAGNOSIS — Z510 Encounter for antineoplastic radiation therapy: Secondary | ICD-10-CM | POA: Diagnosis not present

## 2016-07-17 ENCOUNTER — Ambulatory Visit: Payer: Managed Care, Other (non HMO)

## 2016-07-17 ENCOUNTER — Ambulatory Visit
Admission: RE | Admit: 2016-07-17 | Discharge: 2016-07-17 | Disposition: A | Discharge status: Home / Self Care | Payer: Managed Care, Other (non HMO) | Source: Ambulatory Visit | Attending: Radiation Oncology | Admitting: Radiation Oncology

## 2016-07-17 DIAGNOSIS — Z510 Encounter for antineoplastic radiation therapy: Secondary | ICD-10-CM | POA: Diagnosis not present

## 2016-07-18 ENCOUNTER — Ambulatory Visit: Payer: Managed Care, Other (non HMO)

## 2016-07-18 ENCOUNTER — Ambulatory Visit
Admission: RE | Admit: 2016-07-18 | Discharge: 2016-07-18 | Disposition: A | Discharge status: Home / Self Care | Payer: Managed Care, Other (non HMO) | Source: Ambulatory Visit | Attending: Radiation Oncology | Admitting: Radiation Oncology

## 2016-07-18 DIAGNOSIS — Z510 Encounter for antineoplastic radiation therapy: Secondary | ICD-10-CM | POA: Diagnosis not present

## 2016-07-19 ENCOUNTER — Encounter: Payer: Self-pay | Admitting: Radiation Oncology

## 2016-07-19 ENCOUNTER — Ambulatory Visit: Payer: Managed Care, Other (non HMO) | Admitting: Hematology and Oncology

## 2016-07-19 ENCOUNTER — Ambulatory Visit
Admission: RE | Admit: 2016-07-19 | Discharge: 2016-07-19 | Disposition: A | Discharge status: Home / Self Care | Payer: Managed Care, Other (non HMO) | Source: Ambulatory Visit | Attending: Radiation Oncology | Admitting: Radiation Oncology

## 2016-07-19 DIAGNOSIS — Z510 Encounter for antineoplastic radiation therapy: Secondary | ICD-10-CM | POA: Diagnosis not present

## 2016-07-19 NOTE — Assessment & Plan Note (Deleted)
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 If she did not do radiation her risk of recurrence was 7% over 5 years 11% over 10 years. With her antiestrogen therapy risk of recurrence of 6% or 5 years and 9% over 10 years. If she did not do anything after surgery her risk of recurrence is 14% or 5 years and 22% over 10 years.  Treatment plan: Antiestrogen therapy with tamoxifen 5 years Tamoxifen counseling:We discussed the risks and benefits of tamoxifen. These include but not limited to insomnia, hot flashes, mood changes, vaginal dryness, and weight gain. Although rare, serious side effects including endometrial cancer, risk of blood clots were also discussed. We strongly believe that the benefits far outweigh the risks. Patient understands these risks and consented to starting treatment. Planned treatment duration is 5-7 years.  Return to clinic in 3 months for follow-up

## 2016-07-20 ENCOUNTER — Telehealth: Payer: Self-pay | Admitting: Hematology and Oncology

## 2016-07-20 ENCOUNTER — Telehealth: Payer: Self-pay | Admitting: *Deleted

## 2016-07-20 NOTE — Telephone Encounter (Signed)
  Oncology Nurse Navigator Documentation  Navigator Location: [facility] (07/20/16 1300)   )Navigator Encounter Type: Telephone (07/20/16 1300) Telephone: Outgoing Call (07/20/16 1300)     Surgery Date: 04/20/16 (07/20/16 1300) Genetic Counseling Date:  (None) (07/20/16 1300) Genetic Counseling Type:  (None) (07/20/16 1300) Plastic Surgery Consult Date:  (None) (07/20/16 1300)     Treatment Initiated Date: 04/20/16 (07/20/16 1300) Patient Visit Type: SXJDBZ (07/20/16 1300) Treatment Phase: Final Radiation Tx (07/20/16 1300) Barriers/Navigation Needs: No barriers at this time;No Questions;No Needs (07/20/16 1300)   Interventions: Referrals (07/20/16 1300) Referrals: Survivorship (07/20/16 1300)          Acuity: Level 1 (07/20/16 1300)         Time Spent with Patient: 15 (07/20/16 1300)

## 2016-07-20 NOTE — Telephone Encounter (Signed)
sw pt to confirm 3/30 appt at [facility] per LOS

## 2016-07-21 ENCOUNTER — Encounter: Payer: Self-pay | Admitting: Hematology and Oncology

## 2016-07-21 ENCOUNTER — Ambulatory Visit (HOSPITAL_BASED_OUTPATIENT_CLINIC_OR_DEPARTMENT_OTHER): Payer: Managed Care, Other (non HMO) | Admitting: Hematology and Oncology

## 2016-07-21 DIAGNOSIS — Z170 Estrogen receptor positive status [ER+]: Secondary | ICD-10-CM | POA: Diagnosis not present

## 2016-07-21 DIAGNOSIS — D0511 Intraductal carcinoma in situ of right breast: Secondary | ICD-10-CM

## 2016-07-21 DIAGNOSIS — C50411 Malignant neoplasm of upper-outer quadrant of right female breast: Secondary | ICD-10-CM

## 2016-07-21 NOTE — Assessment & Plan Note (Signed)
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 If she did not do radiation her risk of recurrence was 7% over 5 years 11% over 10 years. With her antiestrogen therapy risk of recurrence of 6% or 5 years and 9% over 10 years. If she did not do anything after surgery her risk of recurrence is 14% or 5 years and 22% over 10 years.  Treatment plan: Antiestrogen therapy with tamoxifen 5 years Tamoxifen counseling:We discussed the risks and benefits of tamoxifen. These include but not limited to insomnia, hot flashes, mood changes, vaginal dryness, and weight gain. Although rare, serious side effects including endometrial cancer, risk of blood clots were also discussed. We strongly believe that the benefits far outweigh the risks. Patient understands these risks and consented to starting treatment. Planned treatment duration is 5-7 years.  Return to clinic in 3 months for follow-up

## 2016-07-21 NOTE — Progress Notes (Signed)
  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  Beams/energy:   3D // 10X Photon  Narrative: The patient tolerated radiation treatment relatively well. The patient's only complaint was right breast pain that was not constant; likely from surgery. She had mild dryness and moderate hyperpigmentation of the right breast with intact skin.  Plan: The patient has completed radiation treatment. The patient will return to radiation oncology clinic for routine followup in one month. I advised them to call or return sooner if they have any questions or concerns related to their recovery or treatment.  -----------------------------------  Eppie Gibson, MD  This document serves as a record of services personally performed by Eppie Gibson, MD. It was created on her behalf by Darcus Austin, a trained medical scribe. The creation of this record is based on the scribe's personal observations and the provider's statements to them. This document has been checked and approved by the attending provider.

## 2016-07-21 NOTE — Progress Notes (Signed)
Patient Care Team: Lawerance Cruel, MD as PCP - General (Family Medicine) Fanny Skates, MD as Consulting Physician (General Surgery) Truitt Merle, MD as Consulting Physician (Hematology) Eppie Gibson, MD as Attending Physician (Radiation Oncology)  DIAGNOSIS:  Encounter Diagnosis  Name Primary?  . 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 We had previously discussed the risks and benefits of tamoxifen and she had done a lot of research and interim and made up her decision that she does not want to take any antiestrogen treatment. She attributes her DCIS to having been on estrogen replacement therapy for a long time. Since she is no longer on that she feels comfortable in observation at this time.   REVIEW OF SYSTEMS:   Constitutional: Denies fevers, chills or abnormal weight loss Eyes: Denies blurriness of vision Ears, nose, mouth, throat, and face: Denies mucositis or sore throat Respiratory: Denies cough, dyspnea or wheezes Cardiovascular: Denies palpitation, chest discomfort Gastrointestinal:   Denies nausea, heartburn or change in bowel habits Skin: Denies abnormal skin rashes Lymphatics: Denies new lymphadenopathy or easy bruising Neurological:Denies numbness, tingling or new weaknesses Behavioral/Psych: Mood is stable, no new changes  Extremities: No lower extremity edema Breast:  Radiation dermatitis All other systems were reviewed with the patient and are negative.  I have reviewed the past medical history, past surgical history, social history and family history with the patient and they are unchanged from previous note.  ALLERGIES:  is allergic to invokana [canagliflozin].  MEDICATIONS:  Current Outpatient Prescriptions  Medication Sig Dispense Refill  . APIDRA SOLOSTAR 100 UNIT/ML Solostar Pen INEJCT 15 UNITS INTO THE SKIN IN THE MORNING, 15 UNITS AT LUNCH AND 20 UNITS AT DINNER (Patient taking differently: INEJCT 15-20 subcutaneously units three times daily, inject 15 UNITS INTO THE SKIN IN THE MORNING, 15 UNITS AT LUNCH AND 20 UNITS AT DINNER) 15 pen 3  . aspirin EC 325 MG EC tablet Take 1 tablet (325 mg total) by mouth daily. 30 tablet 0  . B-D UF III MINI PEN NEEDLES 31G X 5 MM MISC USE 4 DAILY 200 each 3  . B-D UF III MINI PEN NEEDLES 31G X 5 MM MISC USE TO INJECT INSULIN FOUR TIMES DAILY 200 each 0  . Biotin 10000 MCG TABS Take by mouth.    . Cholecalciferol (VITAMIN D-3 PO) Take by mouth.    . Coenzyme Q-10 100 MG capsule Take 100 mg by mouth daily.    . Cyanocobalamin (VITAMIN B-12) 2500 MCG SUBL Place under the tongue.    . DULERA 100-5 MCG/ACT AERO  Inhale 2 puffs into the lungs 2 (two) times daily as needed for wheezing or shortness of breath (allergies).     Marland Kitchen glucose blood (ONETOUCH VERIO) test strip USE AS DIRECTED TO CHECK BLOOD SUGARS THREE TIMES DAILY Dx code E11.65 150 each 3  . hyaluronate sodium (RADIAPLEXRX) GEL Apply 1 application topically once.    . Insulin Glargine (TOUJEO SOLOSTAR) 300 UNIT/ML SOPN Inject 70 Units into the skin daily. 8 pen 0  .  Liraglutide (VICTOZA) 18 MG/3ML SOPN Inject 1.8 mg into the skin daily. Around 1500-1600pm    . Multiple Vitamin (MULITIVITAMIN WITH MINERALS) TABS Take 1 tablet by mouth daily.      Marland Kitchen olmesartan (BENICAR) 40 MG tablet Take 40 mg by mouth daily.    . Omega-3 Fatty Acids (FISH OIL) 1000 MG CAPS Take by mouth.    . pantoprazole (PROTONIX) 40 MG tablet Take 40 mg by mouth daily.     . rosuvastatin (CRESTOR) 20 MG tablet Take 20 mg by mouth daily.    Nelva Nay SOLOSTAR 300 UNIT/ML SOPN INJECT 70 UNITS INTO THE SKIN DAILY 5 pen 0  . valACYclovir (VALTREX) 500 MG tablet Take 1,000 mg by mouth daily as needed (flareups/outbreaks).    . venlafaxine (EFFEXOR-XR) 150 MG 24 hr capsule Take 150 mg by mouth daily.       No current facility-administered medications for this visit.     PHYSICAL EXAMINATION: ECOG PERFORMANCE STATUS: 1 - Symptomatic but completely ambulatory  Vitals:   07/21/16 0854  BP: (!) 153/79  Pulse: 93  Resp: 20  Temp: 98.1 F (36.7 C)   Filed Weights   07/21/16 0854  Weight: 256 lb 12.8 oz (116.5 kg)    GENERAL:alert, no distress and comfortable SKIN: skin color, texture, turgor are normal, no rashes or significant lesions EYES: normal, Conjunctiva are pink and non-injected, sclera clear OROPHARYNX:no exudate, no erythema and lips, buccal mucosa, and tongue normal  NECK: supple, thyroid normal size, non-tender, without nodularity LYMPH:  no palpable lymphadenopathy in the cervical, axillary or inguinal LUNGS: clear to auscultation and percussion with normal breathing effort HEART: regular rate & rhythm and no murmurs and no lower extremity edema ABDOMEN:abdomen soft, non-tender and normal bowel sounds MUSCULOSKELETAL:no cyanosis of digits and no clubbing  NEURO: alert & oriented x 3 with fluent speech, no focal motor/sensory deficits EXTREMITIES: No lower extremity edema  LABORATORY DATA:  I have reviewed the data as listed   Chemistry      Component Value  Date/Time   NA 140 05/05/2016 0816   K 4.4 05/05/2016 0816   CL 104 05/05/2016 0816   CO2 31 05/05/2016 0816   BUN 18 05/05/2016 0816   CREATININE 1.05 05/05/2016 0816      Component Value Date/Time   CALCIUM 9.7 05/05/2016 0816   ALKPHOS 84 05/05/2016 0816   AST 21 05/05/2016 0816   ALT 23 05/05/2016 0816   BILITOT 0.3 05/05/2016 0816       Lab Results  Component Value Date   WBC 9.3 03/24/2016   HGB 14.6 03/24/2016   HCT 43.0 03/24/2016   MCV 88.1 03/24/2016   PLT 293 03/24/2016   NEUTROABS 4.5 03/24/2016    ASSESSMENT & PLAN:  Malignant neoplasm of upper-outer quadrant of right female breast ([place]) Right lumpectomy 04/20/2016: 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 If she did not do radiation her risk of recurrence was 7% over 5 years 11% over 10 years. With her antiestrogen therapy risk of recurrence of 6% or 5 years and 9% over 10 years.   Patient understood the risks and benefits of tamoxifen therapy and decided that she does not want to take it. I discussed with her the importance of exercise and eating fruits and vegetables and reducing red meat. She will get annual mammograms and breast exams with her surgeon and gynecologist.  Return to clinic on an as-needed basis.  I spent 25 minutes talking to the patient of which more than half was spent in counseling and coordination of care.  No orders of the defined types were placed in this encounter.  The patient has a good understanding of the overall plan. she agrees with it. she will call with any problems that may develop before the next visit here.   Rulon Eisenmenger, MD 07/21/16

## 2016-07-26 ENCOUNTER — Other Ambulatory Visit: Payer: Self-pay | Admitting: Endocrinology

## 2016-08-07 ENCOUNTER — Telehealth: Payer: Self-pay | Admitting: *Deleted

## 2016-08-07 NOTE — Telephone Encounter (Signed)
Called and spoke with pt. Advised appt cancelled tomorrow with Dr Erlinda Hong at [place] closed, no power. Dr Erlinda Hong nurse will call her back to r/s. Apologized for any inconvenience. Pt verbalized understanding and appreciation for call.

## 2016-08-08 ENCOUNTER — Ambulatory Visit: Payer: Managed Care, Other (non HMO) | Admitting: Neurology

## 2016-08-08 NOTE — Telephone Encounter (Signed)
Patient can see Dr. Leonie Man at next availability.

## 2016-08-08 NOTE — Telephone Encounter (Signed)
Left vm for patient to call back to r/s with Dr. Leonie Man at next available. Please schedule pt at next available with Dr. Leonie Man.

## 2016-08-09 ENCOUNTER — Other Ambulatory Visit: Payer: Self-pay | Admitting: Endocrinology

## 2016-08-16 ENCOUNTER — Other Ambulatory Visit: Payer: Self-pay | Admitting: Endocrinology

## 2016-08-16 DIAGNOSIS — Z794 Long term (current) use of insulin: Secondary | ICD-10-CM

## 2016-08-16 DIAGNOSIS — E1165 Type 2 diabetes mellitus with hyperglycemia: Secondary | ICD-10-CM

## 2016-08-18 ENCOUNTER — Other Ambulatory Visit: Payer: Self-pay | Admitting: Endocrinology

## 2016-08-22 ENCOUNTER — Encounter: Payer: Self-pay | Admitting: *Deleted

## 2016-08-23 ENCOUNTER — Encounter: Payer: Self-pay | Admitting: Radiation Oncology

## 2016-08-23 ENCOUNTER — Ambulatory Visit
Admission: RE | Admit: 2016-08-23 | Discharge: 2016-08-23 | Disposition: A | Discharge status: Home / Self Care | Payer: Managed Care, Other (non HMO) | Source: Ambulatory Visit | Attending: Radiation Oncology | Admitting: Radiation Oncology

## 2016-08-23 ENCOUNTER — Other Ambulatory Visit (INDEPENDENT_AMBULATORY_CARE_PROVIDER_SITE_OTHER): Payer: Managed Care, Other (non HMO)

## 2016-08-23 DIAGNOSIS — Z08 Encounter for follow-up examination after completed treatment for malignant neoplasm: Secondary | ICD-10-CM | POA: Diagnosis not present

## 2016-08-23 DIAGNOSIS — Z853 Personal history of malignant neoplasm of breast: Secondary | ICD-10-CM | POA: Diagnosis not present

## 2016-08-23 DIAGNOSIS — E1165 Type 2 diabetes mellitus with hyperglycemia: Secondary | ICD-10-CM | POA: Diagnosis not present

## 2016-08-23 DIAGNOSIS — Z794 Long term (current) use of insulin: Secondary | ICD-10-CM | POA: Insufficient documentation

## 2016-08-23 DIAGNOSIS — Z7982 Long term (current) use of aspirin: Secondary | ICD-10-CM | POA: Diagnosis not present

## 2016-08-23 DIAGNOSIS — Z170 Estrogen receptor positive status [ER+]: Secondary | ICD-10-CM

## 2016-08-23 DIAGNOSIS — C50411 Malignant neoplasm of upper-outer quadrant of right female breast: Secondary | ICD-10-CM

## 2016-08-23 LAB — BASIC METABOLIC PANEL
BUN: 16 mg/dL (ref 6–23)
CALCIUM: 9.8 mg/dL (ref 8.4–10.5)
CO2: 30 meq/L (ref 19–32)
CREATININE: 1.02 mg/dL (ref 0.40–1.20)
Chloride: 99 mEq/L (ref 96–112)
GFR: 71.44 mL/min (ref >60.00)
GLUCOSE: 342 mg/dL — AB (ref 70–99)
Potassium: 3.9 mEq/L (ref 3.5–5.1)
SODIUM: 137 meq/L (ref 135–145)

## 2016-08-23 LAB — HEMOGLOBIN A1C: Hgb A1c MFr Bld: 10.4 % — ABNORMAL HIGH (ref 4.6–6.5)

## 2016-08-23 NOTE — Progress Notes (Signed)
Radiation Oncology         [phone] ________________________________  Name: Destiny Beasley MRN: [id number]  Date: 08/23/2016  DOB: [birth year]  Follow-Up Visit Note  Outpatient  CC: Melinda Crutch, MD  Nicholas Lose, MD  Diagnosis and Prior Radiotherapy:    ICD-9-CM ICD-10-CM   1. 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  CHIEF COMPLAINT: Here for follow-up and surveillance of right breast cancer  Narrative:  The patient returns today for routine follow-up. The patient saw Dr. Lindi Adie on 07/21/16 and the patient has decided not to take Tamoxifen given its side effects. She attributes her DCIS to having been on estrogen replacement therapy for a long time for which she is no longer taking. The patient denies pain of the right breast.  ALLERGIES:  is allergic to invokana [canagliflozin].  Meds: Current Outpatient Prescriptions  Medication Sig Dispense Refill  . APIDRA SOLOSTAR 100 UNIT/ML Solostar Pen INEJCT 15 UNITS INTO THE SKIN IN THE MORNING, 15 UNITS AT LUNCH AND 20 UNITS AT DINNER (Patient taking differently: INEJCT 15-20 subcutaneously units three times daily, inject 15 UNITS INTO THE SKIN IN THE MORNING, 15 UNITS AT LUNCH AND 20 UNITS AT DINNER) 15 pen 3  . aspirin EC 325 MG EC tablet Take 1 tablet (325 mg total) by mouth daily. 30 tablet 0  . B-D UF III MINI PEN NEEDLES 31G X 5 MM MISC USE 4 DAILY 200 each 3  . B-D UF III MINI PEN NEEDLES 31G X 5 MM MISC USE TO INJECT INSULIN FOUR TIMES DAILY 200 each 0  . B-D UF III MINI PEN NEEDLES 31G X 5 MM MISC USE TO INJECT INSULIN FOUR TIMES DAILY 200 each 0  . Biotin 10000 MCG TABS Take by mouth.    . Cholecalciferol (VITAMIN D-3 PO) Take by mouth.    . Coenzyme Q-10 100 MG capsule Take 100  mg by mouth daily.    . Cyanocobalamin (VITAMIN B-12) 2500 MCG SUBL Place under the tongue.    . DULERA 100-5 MCG/ACT AERO Inhale 2 puffs into the lungs 2 (two) times daily as needed for wheezing or shortness of breath (allergies).     . Insulin Glargine (TOUJEO SOLOSTAR) 300 UNIT/ML SOPN Inject 70 Units into the skin daily. 8 pen 0  . Liraglutide (VICTOZA) 18 MG/3ML SOPN Inject 1.8 mg into the skin daily. Around 1500-1600pm    . Multiple Vitamin (MULITIVITAMIN WITH MINERALS) TABS Take 1 tablet by mouth daily.      . Omega-3 Fatty Acids (FISH OIL) 1000 MG CAPS Take by mouth.    Glory Rosebush VERIO test strip TEST THREE TIMES DAILY USE AS DIRECTED 150 each 1  . TOUJEO SOLOSTAR 300 UNIT/ML SOPN INJECT 70 UNITS UNDER THE SKIN DAILY 6 pen 0  . valACYclovir (VALTREX) 500 MG tablet Take 1,000 mg by mouth daily as needed (flareups/outbreaks).    . venlafaxine (EFFEXOR-XR) 150 MG 24 hr capsule Take 75 mg by mouth daily.     Marland Kitchen VICTOZA 18 MG/3ML SOPN ADMINISTER 1.8 MG UNDER THE SKIN DAILY 9 mL 0   No current facility-administered medications for this encounter.     Physical Findings: The patient is in no acute distress. Patient is alert and  oriented.  height is 6' (1.829 m) and weight is 251 lb (113.9 kg). Her temperature is 98.4 F (36.9 C). Her blood pressure is 144/77 (abnormal) and her pulse is 80. Her oxygen saturation is 98%. .    Residual hyperpigmentation of the right breast, skin intact.  Lab Findings: Lab Results  Component Value Date   WBC 9.3 03/24/2016   HGB 14.6 03/24/2016   HCT 43.0 03/24/2016   MCV 88.1 03/24/2016   PLT 293 03/24/2016    Radiographic Findings: No results found.  Impression/Plan:  No evidence of disease on clinical exam  I encouraged her to continue with yearly mammography. I will see her back on an as-needed basis. I recommended she attend survivorship as scheduled on 09/12/16 and use vitamin E oil on the skin of her right breast for continued healing. She  plans for breast reduction on the left to establish more symmetry of her breasts. I have encouraged her to call if she has any issues or concerns in the future. I wished her the very best.   _____________________________________   Eppie Gibson, MD  This document serves as a record of services personally performed by Eppie Gibson, MD. It was created on her behalf by Darcus Austin, a trained medical scribe. The creation of this record is based on the scribe's personal observations and the provider's statements to them. This document has been checked and approved by the attending provider.

## 2016-08-23 NOTE — Progress Notes (Signed)
Destiny Beasley presents for follow up of radiation to Right Breast completed 07/19/16. She denies pain. She has declined Tamoxifen per Dr. Geralyn Flash recent note. She has a survivorship appointment on 09/12/16 and she has been made aware of this today. Her radiation site has some mild hyperpigmentation and is healing well. She is using an aloe vera cream daily to this area.   BP (!) 144/77   Pulse 80   Temp 98.4 F (36.9 C)   Ht 6' (1.829 m)   Wt 251 lb (113.9 kg)   SpO2 98% Comment: room air  BMI 34.04 kg/m    Wt Readings from Last 3 Encounters:  08/23/16 251 lb (113.9 kg)  07/21/16 256 lb 12.8 oz (116.5 kg)  07/10/16 253 lb 9.6 oz (115 kg)

## 2016-08-28 ENCOUNTER — Ambulatory Visit (INDEPENDENT_AMBULATORY_CARE_PROVIDER_SITE_OTHER): Payer: Managed Care, Other (non HMO) | Admitting: Endocrinology

## 2016-08-28 ENCOUNTER — Encounter: Payer: Self-pay | Admitting: Endocrinology

## 2016-08-28 VITALS — BP 134/88 | HR 91 | Ht 72.0 in | Wt 252.4 lb

## 2016-08-28 DIAGNOSIS — Z794 Long term (current) use of insulin: Secondary | ICD-10-CM | POA: Diagnosis not present

## 2016-08-28 DIAGNOSIS — E1165 Type 2 diabetes mellitus with hyperglycemia: Secondary | ICD-10-CM | POA: Diagnosis not present

## 2016-08-28 NOTE — Progress Notes (Signed)
Patient ID: Destiny Beasley, female   DOB: 1955-08-15, 61 y.o.   MRN: [id number]   Reason for Appointment: Type II Diabetes follow-up   History of Present Illness    Date of diagnosis: 10/2008  Previous history: She had markedly increased blood sugars at diagnosis and was tried on oral hypoglycemic drugs and Victoza for about 9 months before starting an insulin.  Since 2011 she had been on basal bolus insulin regimen Previously had had difficulty controlling her diabetes mostly because of difficulty with compliance with various aspects of self-care.   Recent history:   Insulin regimen: Toujeo 60 units at 8 am . Apidra 20 units at times Non-insulin hypoglycemic drugs: Victoza 1.8 mg daily  Her A1c has been consistently high and only a couple of times below 9% in the past, now 10.4        Current management, blood sugar patterns and problems:  She was tried on the V-go pump in January but she thinks her blood sugars were running higher and she stopped taking this  She says she has periodic problems with getting her insurance to cover her Toujeo and intermittently will get high readings because of lack of prescriptions  She now said that she is taking her APIDRA 15-20 minutes after eating instead of with a meal  Also is very erratic with her glucose monitoring as well as taking mealtime insulin  Her fasting readings are inconsistent and mostly high recently with a couple of readings around 130  Hypoglycemia has occurred once during the night when trying to make a correction for a high reading  She thinks she is trying to get her Victoza more regularly now  She has on her own increased her Toujeo by 10 units since last visit  Side effects from medications: Candidiasis from Invokana  Glucose readings from download  Mean values apply above for all meters except median for One Touch  PRE-MEAL Fasting Lunch Dinner Bedtime Overall  Glucose range: 130-3 99    118-349      Mean/median:     160     Exercise:was exercising at gym 3 times a week until recent Surgery on foot    Dietician visit: Most recent: 04/2010 Weight control:  Wt Readings from Last 3 Encounters:  08/28/16 252 lb 6.4 oz (114.5 kg)  08/23/16 251 lb (113.9 kg)  07/21/16 256 lb 12.8 oz (116.5 kg)         Diabetes labs:  Lab Results  Component Value Date   HGBA1C 10.4 (H) 08/23/2016   HGBA1C 10.0 (H) 05/05/2016   HGBA1C 9.1 (H) 03/22/2016   Lab Results  Component Value Date   MICROALBUR 0.8 02/04/2016   LDLCALC 79 05/05/2016   CREATININE 1.02 08/23/2016    OBESITY: Thinks she had difficulty with word finding when she was taking Topamax in the Qsymia   No visits with results within 1 Day(s) from this visit.  Latest known visit with results is:  Lab on 08/23/2016  Component Date Value Ref Range Status  . Hgb A1c MFr Bld 08/23/2016 10.4* 4.6 - 6.5 % Final  . Sodium 08/23/2016 137  135 - 145 mEq/L Final  . Potassium 08/23/2016 3.9  3.5 - 5.1 mEq/L Final  . Chloride 08/23/2016 99  96 - 112 mEq/L Final  . CO2 08/23/2016 30  19 - 32 mEq/L Final  . Glucose, Bld 08/23/2016 342* 70 - 99 mg/dL Final  . BUN 08/23/2016 16  6 - 23 mg/dL Final  .  Creatinine, Ser 08/23/2016 1.02  0.40 - 1.20 mg/dL Final  . Calcium 08/23/2016 9.8  8.4 - 10.5 mg/dL Final  . GFR 08/23/2016 71.44  >60.00 mL/min Final     Allergies as of 08/28/2016      Reactions   Invokana [canagliflozin] Other (See Comments)   Constant yeast infections      Medication List       Accurate as of 08/28/16 10:04 AM. Always use your most recent med list.          Alpha-Lipoic Acid 600 MG Caps Take 1 capsule by mouth daily.   APIDRA SOLOSTAR 100 UNIT/ML Solostar Pen Generic drug:  Insulin Glulisine INEJCT 15 UNITS INTO THE SKIN IN THE MORNING, 15 UNITS AT LUNCH AND 20 UNITS AT DINNER   aspirin 325 MG EC tablet Take 1 tablet (325 mg total) by mouth daily.   B-D UF III MINI PEN NEEDLES 31G X 5 MM Misc Generic  drug:  Insulin Pen Needle USE TO INJECT INSULIN FOUR TIMES DAILY   Biotin 10000 MCG Tabs Take by mouth.   Coenzyme Q-10 100 MG capsule Take 100 mg by mouth daily.   DULERA 100-5 MCG/ACT Aero Generic drug:  mometasone-formoterol Inhale 2 puffs into the lungs 2 (two) times daily as needed for wheezing or shortness of breath (allergies).   Fish Oil 1000 MG Caps Take by mouth.   Insulin Glargine 300 UNIT/ML Sopn Commonly known as:  TOUJEO SOLOSTAR Inject 70 Units into the skin daily.   multivitamin with minerals Tabs tablet Take 1 tablet by mouth daily.   ONETOUCH VERIO test strip Generic drug:  glucose blood TEST THREE TIMES DAILY USE AS DIRECTED   valACYclovir 500 MG tablet Commonly known as:  VALTREX Take 1,000 mg by mouth daily as needed (flareups/outbreaks).   venlafaxine XR 150 MG 24 hr capsule Commonly known as:  EFFEXOR-XR Take 75 mg by mouth daily.   VICTOZA 18 MG/3ML Sopn Generic drug:  liraglutide Inject 1.8 mg into the skin daily. Around 1500-1600pm   Vitamin B-12 2500 MCG Subl Place under the tongue.   VITAMIN D-3 PO Take by mouth.       Allergies:  Allergies  Allergen Reactions  . Invokana [Canagliflozin] Other (See Comments)    Constant yeast infections    Past Medical History:  Diagnosis Date  . Anxiety   . Diabetes mellitus   . GERD (gastroesophageal reflux disease)   . History of radiation therapy 06/14/2016 - 07/19/2016   Right Breast 50 Gy 25 fractions  . Hypercholesteremia   . Hypertension   . Malignant neoplasm of upper-outer quadrant of right female breast ([place]) 02/24/2016  . Sleep apnea    use CPAP  . Stroke [facility])    TIA - Dec 1 st, 2017    Past Surgical History:  Procedure Laterality Date  . ABDOMINAL HYSTERECTOMY    . BRAIN MENINGIOMA EXCISION  2005  . BREAST LUMPECTOMY WITH RADIOACTIVE SEED LOCALIZATION Right 04/20/2016   Procedure: RIGHT BREAST LUMPECTOMY WITH RADIOACTIVE SEED LOCALIZATION;  Surgeon: Fanny Skates,  MD;  Location: [facility];  Service: General;  Laterality: Right;  . CESAREAN SECTION    . FOOT SURGERY Bilateral 2016   hammer toe surgery  . MENISCUS REPAIR Left 2015  . PITUITARY SURGERY    . THYROID SURGERY      Family History  Problem Relation Age of Onset  . Diabetes Mother   . Hyperlipidemia Mother   . Hypertension Mother  Social History:  reports that she has never smoked. She has never used smokeless tobacco. She reports that she drinks about 0.6 oz of alcohol per week . She reports that she does not use drugs.  Review of Systems:  Hypertension:   is taking Benicar HCT/amlodipine, Followed by PCP   Lipids:  adequate control of LDL, normal triglycerides  Lab Results  Component Value Date   CHOL 141 05/05/2016   HDL 48.10 05/05/2016   LDLCALC 79 05/05/2016   TRIG 74.0 05/05/2016   CHOLHDL 3 05/05/2016    Diabetic shoe prescription has been given previously   Examination:   BP 134/88   Pulse 91   Ht 6' (1.829 m)   Wt 252 lb 6.4 oz (114.5 kg)   SpO2 96%   BMI 34.23 kg/m   Body mass index is 34.23 kg/m.     ASSESSMENT/ PLAN:    Diabetes type 2 with obesity See history of present illness for detailed discussion of current management, blood sugar patterns and problems identified  Her A1c is Continuing to be significantly high She has difficulties getting a regimen of glucose monitoring, insulin administration especially mealtime insulin and variable blood sugars Although she thinks she is trying to do better with her diet she still has readings over 300 at night at times She thinks she is still having difficulties getting coverage for her Toujeo insulin and is not taking consistently She did not get enough insulin with the V-go pump and is somewhat insulin resistant Not clear if she is benefiting much from [place] but she thinks it helps her with [place] control sometimes Explained to her that she is insulin deficient now and makes more  efficient insulin delivery  Recommendations today:   Showed her the information on the Omnipod insulin pump and how this would work Also discussed with her the use of the freestyle Libre sensor for better monitoring of her blood sugar She will try to get the Omnipod approved and start on this Reminded her to take her Apidra Before eating or right after Need to check sugars more consistently Continue same dose of Toujeo for now She will need to reduce her dose of Apidra if planning to exercise after a meal  HYPERTENSION: She will need to continue to follow-up with PCP, blood pressure appears to be mildly increased consistently  Counseling time on subjects discussed above is over 50% of today's 25 minute visit   There are no Patient Instructions on file for this visit. Rosanne Wohlfarth 08/28/2016, 10:04 AM

## 2016-08-30 ENCOUNTER — Encounter: Payer: Self-pay | Admitting: Endocrinology

## 2016-08-31 ENCOUNTER — Other Ambulatory Visit: Payer: Self-pay

## 2016-08-31 ENCOUNTER — Other Ambulatory Visit: Payer: Self-pay | Admitting: Endocrinology

## 2016-08-31 ENCOUNTER — Encounter: Payer: Self-pay | Admitting: Endocrinology

## 2016-08-31 MED ORDER — FREESTYLE LIBRE READER DEVI: 1.0000 | 2 refills | Status: DC

## 2016-08-31 MED ORDER — FREESTYLE LIBRE SENSOR SYSTEM MISC: 2 refills | Status: DC

## 2016-09-05 ENCOUNTER — Encounter: Payer: Managed Care, Other (non HMO) | Attending: Endocrinology | Admitting: Nutrition

## 2016-09-05 ENCOUNTER — Other Ambulatory Visit: Payer: Self-pay

## 2016-09-05 DIAGNOSIS — E119 Type 2 diabetes mellitus without complications: Secondary | ICD-10-CM

## 2016-09-05 DIAGNOSIS — Z794 Long term (current) use of insulin: Principal | ICD-10-CM

## 2016-09-05 MED ORDER — FREESTYLE LIBRE SENSOR SYSTEM MISC: 2 refills | Status: DC

## 2016-09-05 MED ORDER — FREESTYLE LIBRE READER DEVI: 1.0000 | 2 refills | Status: DC

## 2016-09-05 NOTE — Progress Notes (Signed)
Pt. Is here for a OmniPod pump start.  She has not received her pump, nor does she know how to carb count.  Appt. Was made in the Nutrition center.  Pt. Was very upset that she took time off work for this and did not take her long acting insulin. She was instructed on carb counting, and we reviewed how to use the PDM.  Irven [place] was called and said the pump shipped on Friday, and she should be getting it tomorrow.  Appt. Was made for tomorrow at [facility].   Dr. Cruzita Lederer agreed to sign the orders for the pump start for this patient, as Dr. Dwyane Dee is on vacation.  Pt.. Not as upset when leaving today.  She was told to set up the PDM with date/time, and to read over the first 2 Chapters in the pump manual.   Also discussed basal/bolus insulin and what is needed to give a bolus.  Her sensors were not at the drug store when she went on Friday.  New information sent to insurance company by Dr. Ronnie Derby office on Friday.  She was told to call the pharmacy to see if they are in before going tonight after work.

## 2016-09-05 NOTE — Patient Instructions (Signed)
Call tomorrow morning to let me know that pump came in. Read over first 2 chapters in the manual before coming to the appt.  Put batteries in the PDM, and set date/time. Take no long acting insulin in the AM tomorrow.

## 2016-09-06 ENCOUNTER — Encounter: Payer: Managed Care, Other (non HMO) | Admitting: Nutrition

## 2016-09-06 ENCOUNTER — Other Ambulatory Visit: Payer: Self-pay

## 2016-09-06 DIAGNOSIS — Z794 Long term (current) use of insulin: Principal | ICD-10-CM

## 2016-09-06 DIAGNOSIS — E119 Type 2 diabetes mellitus without complications: Secondary | ICD-10-CM

## 2016-09-06 MED ORDER — GLUCOSE BLOOD VI STRP: ORAL_STRIP | 2 refills | Status: DC

## 2016-09-06 MED ORDER — INSULIN ASPART 100 UNIT/ML ~~LOC~~ SOLN: 80.0000 [IU] | Freq: Every day | SUBCUTANEOUS | 2 refills | Status: DC

## 2016-09-07 ENCOUNTER — Telehealth: Payer: Self-pay | Admitting: Nutrition

## 2016-09-07 ENCOUNTER — Other Ambulatory Visit: Payer: Self-pay

## 2016-09-07 NOTE — Patient Instructions (Signed)
Test blood sugar before meals, 2 hours after meals, and bedtime. Call tomorrow morning with blood sugars readings. Read over manual.

## 2016-09-07 NOTE — Progress Notes (Signed)
This patient was started on the OmniPod insulin pump using Novolog insulin.  She came in the date/time set on her PDM.   She assisted in putting in basal rates: 1.35u/hr, I/C ratio: 5, ISF: 25, timing: 4 hours.  She filled a pod, and applied it to her mid left abdominal area without any difficulty.  Since she did not take her Tyler Aas this AM, the basal rate was started at this time: 4:30PM.   Her sensors were not ready at the pharmacy, so she was given some freestyle lite test strips to use in the PDM, and shown how to do this.  Her blood sugar was 221, and she gave a correction bolus without any hesitation, or instruction. She was instructed to test her blood sugars before meals, 2 hours after meals, bedtime.   She is going out to dinner with friends this evening, and will not be back until late.  She promised to call me tomorrow with blood sugar readings. We reviewed how to give a bolus using carbs, and blood sugar readings.  She did this X2 without any diffiuclty.  We reviewed alerts and alarms and she had no questions.   Appt. For Monday morning for follow-up and further instructions.  She was given the booklet with photos and instructions for making changes to settings, giving a bolus, and changing a pod.  She had no final questions.

## 2016-09-07 NOTE — Telephone Encounter (Signed)
Blood sugar before supper was 139.  HS: 157.  FBS today was 159.   She reports eating higher fat last night.  She reports no difficulty giving boluses, sleeping with the pod, or wearing it.    No changes made to her settings.

## 2016-09-08 ENCOUNTER — Other Ambulatory Visit: Payer: Self-pay | Admitting: Endocrinology

## 2016-09-10 ENCOUNTER — Encounter: Payer: Self-pay | Admitting: Endocrinology

## 2016-09-11 ENCOUNTER — Encounter: Payer: Managed Care, Other (non HMO) | Admitting: Nutrition

## 2016-09-11 DIAGNOSIS — Z794 Long term (current) use of insulin: Principal | ICD-10-CM

## 2016-09-11 DIAGNOSIS — E119 Type 2 diabetes mellitus without complications: Secondary | ICD-10-CM

## 2016-09-12 ENCOUNTER — Encounter: Payer: Managed Care, Other (non HMO) | Admitting: Adult Health

## 2016-09-12 NOTE — Telephone Encounter (Signed)
LM for pt to call back, per Dr Dwyane Dee pt needs to be seen before the end of this week.

## 2016-09-13 ENCOUNTER — Other Ambulatory Visit: Payer: Self-pay

## 2016-09-13 MED ORDER — INSULIN ASPART 100 UNIT/ML ~~LOC~~ SOLN: 80.0000 [IU] | Freq: Every day | SUBCUTANEOUS | 2 refills | Status: DC

## 2016-09-14 ENCOUNTER — Other Ambulatory Visit: Payer: Self-pay

## 2016-09-14 MED ORDER — INSULIN LISPRO 100 UNIT/ML ~~LOC~~ SOLN: SUBCUTANEOUS | 2 refills | Status: DC

## 2016-09-14 NOTE — Patient Instructions (Signed)
Return to Dr. Dwyane Dee in one week Review manual and call if questions.

## 2016-09-14 NOTE — Progress Notes (Signed)
Pt. Reported having changed the pod on her own, with no difficulty.  We reviewed alerts/alarms, how and when to use a temp basal rate, and how/when to use the combination bolus.  She demonstrated all of these and reported good understanding of this. We also reviewed high blood sugar protocols, sick day guidelines, and rotation of pod sits.  She was given handout on each of these topics.   PDM blood sugar history shows patient is testing her blood sugars 4 times daily and all blood sugars except 1 are below 160, with no low blood sugars.  No changes made to settings at this time. She signed off as understanding all topics with no final questions. She was encouraged to review the manual again, and she agreed to do this.

## 2016-09-26 ENCOUNTER — Telehealth: Payer: Self-pay | Admitting: Endocrinology

## 2016-09-26 NOTE — Telephone Encounter (Signed)
Destiny Beasley from [employer] [facility] called to check the status of the prior authorization on the rx insulin lispro (HUMALOG) 100 UNIT/ML injection. Please call the number provided to advise.

## 2016-10-01 ENCOUNTER — Other Ambulatory Visit: Payer: Self-pay | Admitting: Endocrinology

## 2016-10-04 ENCOUNTER — Other Ambulatory Visit: Payer: Self-pay

## 2016-10-04 MED ORDER — INSULIN LISPRO 100 UNIT/ML ~~LOC~~ SOLN: SUBCUTANEOUS | 2 refills | Status: DC

## 2016-10-11 ENCOUNTER — Ambulatory Visit (INDEPENDENT_AMBULATORY_CARE_PROVIDER_SITE_OTHER): Payer: Managed Care, Other (non HMO) | Admitting: Endocrinology

## 2016-10-11 ENCOUNTER — Encounter: Payer: Managed Care, Other (non HMO) | Attending: Endocrinology | Admitting: Nutrition

## 2016-10-11 ENCOUNTER — Encounter: Payer: Self-pay | Admitting: Endocrinology

## 2016-10-11 ENCOUNTER — Other Ambulatory Visit: Payer: Self-pay | Admitting: Endocrinology

## 2016-10-11 VITALS — BP 134/88 | HR 101 | Ht 72.0 in | Wt 258.2 lb

## 2016-10-11 DIAGNOSIS — Z794 Long term (current) use of insulin: Secondary | ICD-10-CM | POA: Diagnosis not present

## 2016-10-11 DIAGNOSIS — E1165 Type 2 diabetes mellitus with hyperglycemia: Secondary | ICD-10-CM

## 2016-10-11 DIAGNOSIS — E1101 Type 2 diabetes mellitus with hyperosmolarity with coma: Secondary | ICD-10-CM

## 2016-10-11 NOTE — Progress Notes (Signed)
Patient ID: Destiny Beasley, female   DOB: 04-Sep-1955, 61 y.o.   MRN: [id number]   Reason for Appointment: Type II Diabetes follow-up   History of Present Illness    Date of diagnosis: 10/2008  Previous history: She had markedly increased blood sugars at diagnosis and was tried on oral hypoglycemic drugs and Victoza for about 9 months before starting an insulin.  Since 2011 she had been on basal bolus insulin regimen Previously had had difficulty controlling her diabetes mostly because of difficulty with compliance with various aspects of self-care.   Recent history:   Insulin regimen: Omnipod insulin pump: Basal rate 2.15 Carbohydrate coverage 1:10 and active insulin time 3 hours with target 100 Previous regimen: Toujeo 60 units at 8 am . Apidra 20 units at times Non-insulin hypoglycemic drugs: Victoza 1.8 mg daily  Her A1c has been consistently high and on the last visit was 10.4        Current management, blood sugar patterns and problems:  She was started on the Omnipod insulin pump on 09/11/16 and she is now here for follow-up   Although with her pump she is able to take mealtime boluses and cover her meals much better than with injections she does not appear to have better control  She has increased her basal rate on her own with some improvement in her fasting readings although these are somewhat inconsistent  She is checking blood sugars somewhat erratically the rest of the day and mostly in the evenings around suppertime or later  Blood sugars are fairly consistently higher around suppertime and mildly increased at lunchtime  Postprandial readings are difficult to assess and are variable  She appears to be having frequent small intakes of carbohydrate or daylong sometimes and more in the evenings or late at night  She does not always check her blood sugar at the time of bolus  Her weight has gone up  She still has not restarted exercise  Side effects from  medications: Candidiasis from Invokana  Glucose readings from download  Mean values apply above for all meters except median for One Touch  PRE-MEAL Fasting Lunch Dinner Bedtime Overall  Glucose range:  83-225  197, 237  1 89-293   50-267    Mean/median:     183+/-77     Exercise:was exercising at gym 3 times a week until Surgery on foot    Dietician visit: Most recent: 04/2010 Weight control:  Wt Readings from Last 3 Encounters:  10/11/16 258 lb 3.2 oz (117.1 kg)  08/28/16 252 lb 6.4 oz (114.5 kg)  08/23/16 251 lb (113.9 kg)         Diabetes labs:  Lab Results  Component Value Date   HGBA1C 10.4 (H) 08/23/2016   HGBA1C 10.0 (H) 05/05/2016   HGBA1C 9.1 (H) 03/22/2016   Lab Results  Component Value Date   MICROALBUR 0.8 02/04/2016   LDLCALC 79 05/05/2016   CREATININE 1.02 08/23/2016    OBESITY: Thinks she had difficulty with word finding when she was taking Topamax in the Qsymia   No visits with results within 1 Day(s) from this visit.  Latest known visit with results is:  Lab on 08/23/2016  Component Date Value Ref Range Status  . Hgb A1c MFr Bld 08/23/2016 10.4* 4.6 - 6.5 % Final   Glycemic Control Guidelines for People with Diabetes:Non Diabetic:  <6%Goal of Therapy: <7%Additional Action Suggested:  >8%   . Sodium 08/23/2016 137  135 - 145 mEq/L  Final  . Potassium 08/23/2016 3.9  3.5 - 5.1 mEq/L Final  . Chloride 08/23/2016 99  96 - 112 mEq/L Final  . CO2 08/23/2016 30  19 - 32 mEq/L Final  . Glucose, Bld 08/23/2016 342* 70 - 99 mg/dL Final  . BUN 08/23/2016 16  6 - 23 mg/dL Final  . Creatinine, Ser 08/23/2016 1.02  0.40 - 1.20 mg/dL Final  . Calcium 08/23/2016 9.8  8.4 - 10.5 mg/dL Final  . GFR 08/23/2016 71.44  >60.00 mL/min Final     Allergies as of 10/11/2016      Reactions   Hydrocodone    Severe anxiety   Invokana [canagliflozin] Other (See Comments)   Constant yeast infections      Medication List       Accurate as of 10/11/16 11:59 PM.  Always use your most recent med list.          Alpha-Lipoic Acid 600 MG Caps Take 1 capsule by mouth daily.   aspirin 325 MG EC tablet Take 1 tablet (325 mg total) by mouth daily.   B-D UF III MINI PEN NEEDLES 31G X 5 MM Misc Generic drug:  Insulin Pen Needle USE TO INJECT INSULIN FOUR TIMES DAILY   Biotin 10000 MCG Tabs Take by mouth.   Coenzyme Q-10 100 MG capsule Take 100 mg by mouth daily.   diltiazem 180 MG 24 hr capsule Commonly known as:  CARDIZEM CD Take 180 mg by mouth daily.   DULERA 100-5 MCG/ACT Aero Generic drug:  mometasone-formoterol Inhale 2 puffs into the lungs 2 (two) times daily as needed for wheezing or shortness of breath (allergies).   FREESTYLE LIBRE READER Devi 1 Device by Does not apply route every 30 (thirty) days.   FREESTYLE LIBRE SENSOR SYSTEM Misc Use to check blood sugar. Change every 10 days.   FREESTYLE TEST STRIPS test strip Generic drug:  glucose blood USE THREE TIMES DAILY TO CHECK BLOOD SUGAR   multivitamin with minerals Tabs tablet Take 1 tablet by mouth daily.   NOVOLOG 100 UNIT/ML injection Generic drug:  insulin aspart Inject into the skin 3 (three) times daily before meals. Takes about 50 units per day in pump   venlafaxine XR 150 MG 24 hr capsule Commonly known as:  EFFEXOR-XR Take 75 mg by mouth daily.   VICTOZA 18 MG/3ML Sopn Generic drug:  liraglutide ADMINISTER 1.8 MG UNDER THE SKIN DAILY   Vitamin B-12 2500 MCG Subl Place under the tongue.   VITAMIN D-3 PO Take by mouth.       Allergies:  Allergies  Allergen Reactions  . Hydrocodone     Severe anxiety  . Invokana [Canagliflozin] Other (See Comments)    Constant yeast infections    Past Medical History:  Diagnosis Date  . Anxiety   . Diabetes mellitus   . GERD (gastroesophageal reflux disease)   . History of radiation therapy 06/14/2016 - 07/19/2016   Right Breast 50 Gy 25 fractions  . Hypercholesteremia   . Hypertension   . Malignant  neoplasm of upper-outer quadrant of right female breast ([place]) 02/24/2016  . Sleep apnea    use CPAP  . Stroke [facility])    TIA - Dec 1 st, 2017    Past Surgical History:  Procedure Laterality Date  . ABDOMINAL HYSTERECTOMY    . BRAIN MENINGIOMA EXCISION  2005  . BREAST LUMPECTOMY WITH RADIOACTIVE SEED LOCALIZATION Right 04/20/2016   Procedure: RIGHT BREAST LUMPECTOMY WITH RADIOACTIVE SEED LOCALIZATION;  Surgeon: Fanny Skates,  MD;  Location: [facility];  Service: General;  Laterality: Right;  . CESAREAN SECTION    . FOOT SURGERY Bilateral 2016   hammer toe surgery  . MENISCUS REPAIR Left 2015  . PITUITARY SURGERY    . THYROID SURGERY      Family History  Problem Relation Age of Onset  . Diabetes Mother   . Hyperlipidemia Mother   . Hypertension Mother     Social History:  reports that she has never smoked. She has never used smokeless tobacco. She reports that she drinks about 0.6 oz of alcohol per week . She reports that she does not use drugs.  Review of Systems:  Hypertension:   is taking Benicar HCT/amlodipine, Blood pressure is higher than normal, Followed by PCP   Lipids:  adequate control of LDL, normal triglycerides  Lab Results  Component Value Date   CHOL 141 05/05/2016   HDL 48.10 05/05/2016   LDLCALC 79 05/05/2016   TRIG 74.0 05/05/2016   CHOLHDL 3 05/05/2016    Diabetic shoe prescription has been given previously For her neuropathic feet   Examination:   BP 134/88   Pulse (!) 101   Ht 6' (1.829 m)   Wt 258 lb 3.2 oz (117.1 kg)   SpO2 98%   BMI 35.02 kg/m   Body mass index is 35.02 kg/m.     ASSESSMENT/ PLAN:    Diabetes type 2 with obesity See history of present illness for detailed discussion of current management, blood sugar patterns and problems identified  As discussed above she is not getting adequate control of her diabetes Although she is taking less basal insulin than before when she was taking Toujeo she is probably  taking mealtime insulin more consistently and her bolus amount is about 30-40 percent of her insulin She does have difficulty watching her carbohydrate and eating frequently Without exercise and more efficient insulin actions she is gaining weight She has not monitored blood sugars adequately and she cannot afford the freestyle Libre  Recommendations today:   She needs to start exercise to help an insulin sensitivity and prevent further weight gain She should do better with trying to cut back on carbohydrate intake Day-to-day management of her diabetes and insulin pump was discussed in detail With her blood sugars being higher consistently in the evenings she will need to increase her basal rate at noon up to 2.3 and at 6 PM up to 2.5 to match up with her blood sugar patterns She will also have a carbohydrate ratio 1:8 at suppertime after 4 PM She does need to check her blood sugars much more often To have an A1c checked on her next visit She can switch back to Apidra with her pump which is less expensive  WEIGHT management: She does not think she can control portions adequately with Victoza, she will try her Saxenda if it is covered by her insurance  HYPERTENSION: She will need to continue to follow-up with PCP  Counseling time on subjects discussed above is over 50% of today's 25 minute visit   There are no Patient Instructions on file for this visit. Abbas Beyene 10/12/2016, 1:09 PM

## 2016-10-12 ENCOUNTER — Encounter: Payer: Self-pay | Admitting: Endocrinology

## 2016-10-12 ENCOUNTER — Other Ambulatory Visit: Payer: Self-pay

## 2016-10-12 MED ORDER — LIRAGLUTIDE -WEIGHT MANAGEMENT 18 MG/3ML ~~LOC~~ SOPN: 3.0000 mg | PEN_INJECTOR | Freq: Every day | SUBCUTANEOUS | 3 refills | Status: DC

## 2016-10-12 MED ORDER — INSULIN GLULISINE 100 UNIT/ML IJ SOLN: INTRAMUSCULAR | 2 refills | Status: DC

## 2016-10-12 NOTE — Telephone Encounter (Signed)
Medication has been switched to Novolog.

## 2016-10-17 ENCOUNTER — Encounter (HOSPITAL_BASED_OUTPATIENT_CLINIC_OR_DEPARTMENT_OTHER): Payer: Self-pay | Admitting: *Deleted

## 2016-10-17 NOTE — Progress Notes (Signed)
Diabetic Cordinator Judy Hanks notified concerning pt on insulin pump for surgery lasting 2.5hrs. Pt diabetes is poorly controlled on pump, last HgbA1c 10.4, Glu 342. She recommends pt being put on an insulin drip for surgery which is not performed at [facility]. Anesthesia DrTurk recommended pt be moved to main OR. All schedulers and medical staff not available to speak with me on phone at Dr Neita Goodnight office and I left a message with the secretary for someone to call me back.

## 2016-10-18 ENCOUNTER — Encounter (HOSPITAL_BASED_OUTPATIENT_CLINIC_OR_DEPARTMENT_OTHER)
Admission: RE | Admit: 2016-10-18 | Discharge: 2016-10-18 | Disposition: A | Discharge status: Home / Self Care | Payer: Managed Care, Other (non HMO) | Source: Ambulatory Visit | Attending: Specialist | Admitting: Specialist

## 2016-10-18 DIAGNOSIS — N62 Hypertrophy of breast: Secondary | ICD-10-CM | POA: Insufficient documentation

## 2016-10-18 LAB — BASIC METABOLIC PANEL
ANION GAP: 8 (ref 5–15)
BUN: 13 mg/dL (ref 6–20)
CALCIUM: 9.5 mg/dL (ref 8.9–10.3)
CO2: 31 mmol/L (ref 22–32)
CREATININE: 0.85 mg/dL (ref 0.44–1.00)
Chloride: 102 mmol/L (ref 101–111)
GLUCOSE: 85 mg/dL (ref 65–99)
Potassium: 4.1 mmol/L (ref 3.5–5.1)
Sodium: 141 mmol/L (ref 135–145)

## 2016-10-19 NOTE — Progress Notes (Signed)
PT in for PAT testing/ BS 85. Dr. Towanda Malkin states he can complete pt's surgery in one hour and twenty minutes. [place] with Dr. Gifford Shave for pt to have surgery at [facility], since that will comply with diabetics protocol.

## 2016-10-23 ENCOUNTER — Ambulatory Visit (HOSPITAL_BASED_OUTPATIENT_CLINIC_OR_DEPARTMENT_OTHER)
Admission: RE | Admit: 2016-10-23 | Discharge: 2016-10-23 | Disposition: A | Discharge status: Home / Self Care | Payer: Managed Care, Other (non HMO) | Source: Ambulatory Visit | Attending: Specialist | Admitting: Specialist

## 2016-10-23 ENCOUNTER — Encounter (HOSPITAL_BASED_OUTPATIENT_CLINIC_OR_DEPARTMENT_OTHER)
Admission: RE | Disposition: A | Discharge status: Home / Self Care | Payer: Self-pay | Source: Ambulatory Visit | Attending: Specialist

## 2016-10-23 ENCOUNTER — Ambulatory Visit (HOSPITAL_BASED_OUTPATIENT_CLINIC_OR_DEPARTMENT_OTHER): Payer: Managed Care, Other (non HMO) | Admitting: Anesthesiology

## 2016-10-23 ENCOUNTER — Encounter (HOSPITAL_BASED_OUTPATIENT_CLINIC_OR_DEPARTMENT_OTHER): Payer: Self-pay | Admitting: Anesthesiology

## 2016-10-23 DIAGNOSIS — Z923 Personal history of irradiation: Secondary | ICD-10-CM | POA: Insufficient documentation

## 2016-10-23 DIAGNOSIS — N62 Hypertrophy of breast: Secondary | ICD-10-CM | POA: Insufficient documentation

## 2016-10-23 DIAGNOSIS — Z79899 Other long term (current) drug therapy: Secondary | ICD-10-CM | POA: Insufficient documentation

## 2016-10-23 DIAGNOSIS — M549 Dorsalgia, unspecified: Secondary | ICD-10-CM | POA: Diagnosis not present

## 2016-10-23 DIAGNOSIS — M791 Myalgia: Secondary | ICD-10-CM | POA: Insufficient documentation

## 2016-10-23 DIAGNOSIS — Z8673 Personal history of transient ischemic attack (TIA), and cerebral infarction without residual deficits: Secondary | ICD-10-CM | POA: Diagnosis not present

## 2016-10-23 DIAGNOSIS — Z794 Long term (current) use of insulin: Secondary | ICD-10-CM | POA: Insufficient documentation

## 2016-10-23 DIAGNOSIS — M542 Cervicalgia: Secondary | ICD-10-CM | POA: Insufficient documentation

## 2016-10-23 DIAGNOSIS — Z411 Encounter for cosmetic surgery: Secondary | ICD-10-CM | POA: Insufficient documentation

## 2016-10-23 DIAGNOSIS — I10 Essential (primary) hypertension: Secondary | ICD-10-CM | POA: Diagnosis not present

## 2016-10-23 DIAGNOSIS — Z7982 Long term (current) use of aspirin: Secondary | ICD-10-CM | POA: Diagnosis not present

## 2016-10-23 DIAGNOSIS — E119 Type 2 diabetes mellitus without complications: Secondary | ICD-10-CM | POA: Insufficient documentation

## 2016-10-23 DIAGNOSIS — Z853 Personal history of malignant neoplasm of breast: Secondary | ICD-10-CM | POA: Insufficient documentation

## 2016-10-23 HISTORY — PX: BREAST REDUCTION SURGERY: SHX8

## 2016-10-23 LAB — GLUCOSE, CAPILLARY
GLUCOSE-CAPILLARY: 135 mg/dL — AB (ref 65–99)
Glucose-Capillary: 103 mg/dL — ABNORMAL HIGH (ref 65–99)
Glucose-Capillary: 107 mg/dL — ABNORMAL HIGH (ref 65–99)
Glucose-Capillary: 147 mg/dL — ABNORMAL HIGH (ref 65–99)
Glucose-Capillary: 144 mg/dL — ABNORMAL HIGH (ref 65–99)

## 2016-10-23 SURGERY — BREAST REDUCTION WITH LIPOSUCTION
Anesthesia: General | Site: Breast | Laterality: Bilateral

## 2016-10-23 MED ORDER — CEFAZOLIN SODIUM-DEXTROSE 2-3 GM-% IV SOLR
INTRAVENOUS | Status: DC | PRN
  Administered 2016-10-23: 2 g via INTRAVENOUS

## 2016-10-23 MED ORDER — MIDAZOLAM HCL 5 MG/5ML IJ SOLN
INTRAMUSCULAR | Status: DC | PRN
  Administered 2016-10-23: 2 mg via INTRAVENOUS

## 2016-10-23 MED ORDER — LIDOCAINE HCL (PF) 0.5 % IJ SOLN
INTRAMUSCULAR | Status: AC
  Filled 2016-10-23: qty 100

## 2016-10-23 MED ORDER — FENTANYL CITRATE (PF) 100 MCG/2ML IJ SOLN
INTRAMUSCULAR | Status: AC
  Filled 2016-10-23: qty 2

## 2016-10-23 MED ORDER — CHLORHEXIDINE GLUCONATE CLOTH 2 % EX PADS: 6.0000 | MEDICATED_PAD | Freq: Once | CUTANEOUS | Status: DC

## 2016-10-23 MED ORDER — EPINEPHRINE 30 MG/30ML IJ SOLN
INTRAMUSCULAR | Status: AC
  Filled 2016-10-23: qty 1

## 2016-10-23 MED ORDER — SODIUM CHLORIDE 0.9 % IJ SOLN
INTRAMUSCULAR | Status: AC
  Filled 2016-10-23: qty 10

## 2016-10-23 MED ORDER — SUCCINYLCHOLINE CHLORIDE 200 MG/10ML IV SOSY
PREFILLED_SYRINGE | INTRAVENOUS | Status: AC
  Filled 2016-10-23: qty 10

## 2016-10-23 MED ORDER — FENTANYL CITRATE (PF) 100 MCG/2ML IJ SOLN
INTRAMUSCULAR | Status: DC | PRN
  Administered 2016-10-23: 25 ug via INTRAVENOUS
  Administered 2016-10-23: 100 ug via INTRAVENOUS
  Administered 2016-10-23 (×3): 25 ug via INTRAVENOUS

## 2016-10-23 MED ORDER — SODIUM BICARBONATE 4 % IV SOLN
INTRAVENOUS | Status: DC | PRN
  Administered 2016-10-23: 5 mL via INTRAVENOUS

## 2016-10-23 MED ORDER — EPINEPHRINE PF 1 MG/ML IJ SOLN
INTRAMUSCULAR | Status: DC | PRN
  Administered 2016-10-23: 1 mg

## 2016-10-23 MED ORDER — METOCLOPRAMIDE HCL 5 MG/ML IJ SOLN: 10.0000 mg | Freq: Once | INTRAMUSCULAR | Status: DC | PRN

## 2016-10-23 MED ORDER — LIDOCAINE-EPINEPHRINE 0.5 %-1:200000 IJ SOLN
INTRAMUSCULAR | Status: AC
  Filled 2016-10-23: qty 2

## 2016-10-23 MED ORDER — CEFAZOLIN SODIUM-DEXTROSE 2-4 GM/100ML-% IV SOLN: 2.0000 g | INTRAVENOUS | Status: DC

## 2016-10-23 MED ORDER — OXYCODONE HCL 5 MG PO TABS
5.0000 mg | ORAL_TABLET | Freq: Once | ORAL | Status: AC | PRN
  Administered 2016-10-23: 5 mg via ORAL

## 2016-10-23 MED ORDER — FENTANYL CITRATE (PF) 100 MCG/2ML IJ SOLN
25.0000 ug | INTRAMUSCULAR | Status: DC | PRN
  Administered 2016-10-23 (×2): 50 ug via INTRAVENOUS

## 2016-10-23 MED ORDER — LACTATED RINGERS IV SOLN
INTRAVENOUS | Status: DC
  Administered 2016-10-23 (×2): via INTRAVENOUS

## 2016-10-23 MED ORDER — OXYCODONE HCL 5 MG PO TABS
ORAL_TABLET | ORAL | Status: AC
  Filled 2016-10-23: qty 1

## 2016-10-23 MED ORDER — MEPERIDINE HCL 25 MG/ML IJ SOLN: 6.2500 mg | INTRAMUSCULAR | Status: DC | PRN

## 2016-10-23 MED ORDER — SCOPOLAMINE 1 MG/3DAYS TD PT72: 1.0000 | MEDICATED_PATCH | Freq: Once | TRANSDERMAL | Status: DC | PRN

## 2016-10-23 MED ORDER — DEXTROSE 5 % IV SOLN
INTRAVENOUS | Status: DC | PRN
  Administered 2016-10-23: 5 ug/min via INTRAVENOUS

## 2016-10-23 MED ORDER — ONDANSETRON HCL 4 MG/2ML IJ SOLN
INTRAMUSCULAR | Status: DC | PRN
  Administered 2016-10-23: 4 mg via INTRAVENOUS

## 2016-10-23 MED ORDER — MIDAZOLAM HCL 2 MG/2ML IJ SOLN
INTRAMUSCULAR | Status: AC
Start: 2016-10-23 — End: 2016-10-23
  Filled 2016-10-23: qty 2

## 2016-10-23 MED ORDER — LIDOCAINE HCL 2 % IJ SOLN
INTRAMUSCULAR | Status: AC
  Filled 2016-10-23: qty 100

## 2016-10-23 MED ORDER — PHENYLEPHRINE HCL 10 MG/ML IJ SOLN
INTRAMUSCULAR | Status: DC | PRN
  Administered 2016-10-23 (×5): 80 ug via INTRAVENOUS

## 2016-10-23 MED ORDER — LIDOCAINE HCL 2 % IJ SOLN
INTRAMUSCULAR | Status: DC | PRN
  Administered 2016-10-23: 100 mL

## 2016-10-23 MED ORDER — PROPOFOL 10 MG/ML IV BOLUS
INTRAVENOUS | Status: AC
  Filled 2016-10-23: qty 20

## 2016-10-23 MED ORDER — PHENYLEPHRINE HCL 10 MG/ML IJ SOLN
INTRAMUSCULAR | Status: AC
  Filled 2016-10-23: qty 1

## 2016-10-23 MED ORDER — FENTANYL CITRATE (PF) 100 MCG/2ML IJ SOLN: 50.0000 ug | INTRAMUSCULAR | Status: DC | PRN

## 2016-10-23 MED ORDER — LIDOCAINE HCL (CARDIAC) 20 MG/ML IV SOLN
INTRAVENOUS | Status: DC | PRN
  Administered 2016-10-23: 30 mg via INTRAVENOUS

## 2016-10-23 MED ORDER — PROPOFOL 10 MG/ML IV BOLUS
INTRAVENOUS | Status: DC | PRN
  Administered 2016-10-23: 200 mg via INTRAVENOUS
  Administered 2016-10-23: 100 mg via INTRAVENOUS

## 2016-10-23 MED ORDER — PHENYLEPHRINE 40 MCG/ML (10ML) SYRINGE FOR IV PUSH (FOR BLOOD PRESSURE SUPPORT)
PREFILLED_SYRINGE | INTRAVENOUS | Status: AC
  Filled 2016-10-23: qty 10

## 2016-10-23 MED ORDER — CEFAZOLIN SODIUM 1 G IJ SOLR
INTRAMUSCULAR | Status: AC
  Filled 2016-10-23: qty 20

## 2016-10-23 MED ORDER — MIDAZOLAM HCL 2 MG/2ML IJ SOLN: 1.0000 mg | INTRAMUSCULAR | Status: DC | PRN

## 2016-10-23 MED ORDER — SODIUM BICARBONATE 4 % IV SOLN
INTRAVENOUS | Status: AC
  Filled 2016-10-23: qty 5

## 2016-10-23 MED ORDER — SUCCINYLCHOLINE CHLORIDE 20 MG/ML IJ SOLN
INTRAMUSCULAR | Status: DC | PRN
  Administered 2016-10-23: 50 mg via INTRAVENOUS

## 2016-10-23 MED ORDER — LACTATED RINGERS IV SOLN
INTRAVENOUS | Status: AC | PRN
  Administered 2016-10-23: 1000 mL via INTRAVENOUS

## 2016-10-23 SURGICAL SUPPLY — 69 items
BAG DECANTER FOR FLEXI CONT (MISCELLANEOUS) IMPLANT
BENZOIN TINCTURE PRP APPL 2/3 (GAUZE/BANDAGES/DRESSINGS) ×4 IMPLANT
BINDER BREAST LRG (GAUZE/BANDAGES/DRESSINGS) IMPLANT
BINDER BREAST XLRG (GAUZE/BANDAGES/DRESSINGS) ×2 IMPLANT
BINDER BREAST XXLRG (GAUZE/BANDAGES/DRESSINGS) ×2 IMPLANT
BLADE KNIFE  20 PERSONNA (BLADE) ×2
BLADE KNIFE 20 PERSONNA (BLADE) ×2 IMPLANT
BLADE KNIFE PERSONA 10 (BLADE) ×4 IMPLANT
BLADE KNIFE PERSONA 15 (BLADE) ×4 IMPLANT
CANISTER SUCT 1200ML W/VALVE (MISCELLANEOUS) ×2 IMPLANT
COVER BACK TABLE 60X90IN (DRAPES) ×2 IMPLANT
COVER MAYO STAND STRL (DRAPES) ×2 IMPLANT
DECANTER SPIKE VIAL GLASS SM (MISCELLANEOUS) IMPLANT
DRAIN CHANNEL 10F 3/8 F FF (DRAIN) ×4 IMPLANT
DRAPE LAPAROSCOPIC ABDOMINAL (DRAPES) ×2 IMPLANT
DRSG PAD ABDOMINAL 8X10 ST (GAUZE/BANDAGES/DRESSINGS) ×8 IMPLANT
ELECT REM PT RETURN 9FT ADLT (ELECTROSURGICAL) ×2
ELECTRODE REM PT RTRN 9FT ADLT (ELECTROSURGICAL) ×1 IMPLANT
EVACUATOR SILICONE 100CC (DRAIN) ×4 IMPLANT
FILTER LIPOSUCTION (MISCELLANEOUS) ×2 IMPLANT
GAUZE SPONGE 4X4 12PLY STRL (GAUZE/BANDAGES/DRESSINGS) ×4 IMPLANT
GAUZE XEROFORM 5X9 LF (GAUZE/BANDAGES/DRESSINGS) ×4 IMPLANT
GLOVE BIO SURGEON STRL SZ 6.5 (GLOVE) ×4 IMPLANT
GLOVE BIOGEL M STRL SZ7.5 (GLOVE) IMPLANT
GLOVE BIOGEL PI IND STRL 8 (GLOVE) IMPLANT
GLOVE BIOGEL PI INDICATOR 8 (GLOVE)
GLOVE ECLIPSE 7.0 STRL STRAW (GLOVE) ×2 IMPLANT
GOWN STRL REUS W/ TWL LRG LVL3 (GOWN DISPOSABLE) ×1 IMPLANT
GOWN STRL REUS W/ TWL XL LVL3 (GOWN DISPOSABLE) ×1 IMPLANT
GOWN STRL REUS W/TWL LRG LVL3 (GOWN DISPOSABLE) ×1
GOWN STRL REUS W/TWL XL LVL3 (GOWN DISPOSABLE) ×1
IV LACTATED RINGERS 1000ML (IV SOLUTION) ×2 IMPLANT
IV NS 500ML (IV SOLUTION)
IV NS 500ML BAXH (IV SOLUTION) IMPLANT
LINER CANISTER 1000CC FLEX (MISCELLANEOUS) ×4 IMPLANT
MARKER SKIN DUAL TIP RULER LAB (MISCELLANEOUS) ×2 IMPLANT
NDL SAFETY ECLIPSE 18X1.5 (NEEDLE) ×1 IMPLANT
NEEDLE HYPO 18GX1.5 SHARP (NEEDLE) ×1
NEEDLE SPNL 18GX3.5 QUINCKE PK (NEEDLE) ×2 IMPLANT
NS IRRIG 1000ML POUR BTL (IV SOLUTION) ×4 IMPLANT
PACK BASIN DAY SURGERY FS (CUSTOM PROCEDURE TRAY) ×2 IMPLANT
PAD ALCOHOL SWAB (MISCELLANEOUS) ×2 IMPLANT
PEN SKIN MARKING BROAD TIP (MISCELLANEOUS) ×2 IMPLANT
PIN SAFETY STERILE (MISCELLANEOUS) ×2 IMPLANT
SHEETING SILICONE GEL EPI DERM (MISCELLANEOUS) ×2 IMPLANT
SLEEVE SCD COMPRESS KNEE MED (MISCELLANEOUS) ×2 IMPLANT
SPECIMEN JAR MEDIUM (MISCELLANEOUS) IMPLANT
SPECIMEN JAR X LARGE (MISCELLANEOUS) ×4 IMPLANT
SPONGE LAP 18X18 X RAY DECT (DISPOSABLE) ×8 IMPLANT
STRIP SUTURE WOUND CLOSURE 1/2 (SUTURE) ×10 IMPLANT
SUT MNCRL AB 3-0 PS2 18 (SUTURE) ×12 IMPLANT
SUT MON AB 2-0 CT1 36 (SUTURE) IMPLANT
SUT MON AB 5-0 PS2 18 (SUTURE) ×4 IMPLANT
SUT PROLENE 3 0 PS 2 (SUTURE) ×12 IMPLANT
SYR 20CC LL (SYRINGE) IMPLANT
SYR 50ML LL SCALE MARK (SYRINGE) ×4 IMPLANT
SYR CONTROL 10ML LL (SYRINGE) IMPLANT
TAPE HYPAFIX 6X30 (GAUZE/BANDAGES/DRESSINGS) ×2 IMPLANT
TAPE MEASURE 72IN RETRACT (INSTRUMENTS)
TAPE MEASURE LINEN 72IN RETRCT (INSTRUMENTS) IMPLANT
TAPE PAPER 1X10 WHT MICROPORE (GAUZE/BANDAGES/DRESSINGS) ×2 IMPLANT
TOWEL OR 17X24 6PK STRL BLUE (TOWEL DISPOSABLE) ×8 IMPLANT
TRAY DSU PREP LF (CUSTOM PROCEDURE TRAY) ×4 IMPLANT
TUBE CONNECTING 20X1/4 (TUBING) ×2 IMPLANT
TUBING INFILTRATION IT-10001 (TUBING) ×2 IMPLANT
TUBING SET GRADUATE ASPIR 12FT (MISCELLANEOUS) ×2 IMPLANT
UNDERPAD 30X30 (UNDERPADS AND DIAPERS) ×2 IMPLANT
VAC PENCILS W/TUBING CLEAR (MISCELLANEOUS) ×2 IMPLANT
YANKAUER SUCT BULB TIP NO VENT (SUCTIONS) ×2 IMPLANT

## 2016-10-23 NOTE — Anesthesia Postprocedure Evaluation (Signed)
Anesthesia Post Note  Patient: Destiny Beasley  Procedure(s) Performed: Procedure(s) (LRB): BILATERAL BREAST REDUCTION WITH LIPOSUCTION ASSISTANCE (Bilateral)     Patient location during evaluation: PACU Anesthesia Type: General Level of consciousness: awake and alert and oriented Pain management: pain level controlled Vital Signs Assessment: post-procedure vital signs reviewed and stable Respiratory status: spontaneous breathing, nonlabored ventilation and respiratory function stable Cardiovascular status: blood pressure returned to baseline and stable Postop Assessment: no signs of nausea or vomiting Anesthetic complications: no    Last Vitals:  Vitals:   10/23/16 1108 10/23/16 1115  BP:  (!) 187/94  Pulse: 93 89  Resp: 11 17  Temp:      Last Pain:  Vitals:   10/23/16 1121  TempSrc:   PainSc: 5                  Ziyonna Christner A.

## 2016-10-23 NOTE — Patient Instructions (Signed)
Test blood sugars before meals and at bedtime, and let pump make corrections if needed.   Take bolus for all snacks, unless treating a low blood sugar.

## 2016-10-23 NOTE — Brief Op Note (Signed)
10/23/2016  10:17 AM  PATIENT:  Destiny Beasley  61 y.o. female  PRE-OPERATIVE DIAGNOSIS:  BILATERAL MACROMASTIA  POST-OPERATIVE DIAGNOSIS:  BILATERAL MACROMASTIA  PROCEDURE:  Procedure(s): BILATERAL BREAST REDUCTION WITH LIPOSUCTION ASSISTANCE (Bilateral)  SURGEON:  Surgeon(s) and Role:    Cristine Polio, MD - Primary  PHYSICIAN ASSISTANT:   ASSISTANTS: none   ANESTHESIA:   general  EBL:  Total I/O In: 1600 [I.V.:1600] Out: 25 [Blood:25]  BLOOD ADMINISTERED:none  DRAINS: (fff) Jackson-Pratt drain(s) with closed bulb suction in the right and left lateral axillary regions   LOCAL MEDICATIONS USED:  XYLOCAINE   SPECIMEN:  Excision  DISPOSITION OF SPECIMEN:  PATHOLOGY  COUNTS:  YES  TOURNIQUET:  * No tourniquets in log *  DICTATION: .Other Dictation: Dictation Number [id number]  PLAN OF CARE: Discharge to home after PACU  PATIENT DISPOSITION:  PACU - hemodynamically stable.   Delay start of Pharmacological VTE agent (>24hrs) due to surgical blood loss or risk of bleeding: yes

## 2016-10-23 NOTE — Progress Notes (Signed)
We again reviewed all topics, and she was encouraged to read all of the information given on high blood sugar protocols, sick days, daily supplies needed, and She agreed to do this.  She signed off checklist again, and we made changes to her settings, per Dr. Ronnie Derby order.  She needed a little assistance with this.  Booklet given with how to do all of this, and she was reminded to review this as well. Discussed the importance of testing blood sugars before meals and snack to make corrections when the blood sugar is high.  She reported good understanding of this and agreed to do this. She had no final questions.

## 2016-10-23 NOTE — Transfer of Care (Signed)
Immediate Anesthesia Transfer of Care Note  Patient: Destiny Beasley  Procedure(s) Performed: Procedure(s): BILATERAL BREAST REDUCTION WITH LIPOSUCTION ASSISTANCE (Bilateral)  Patient Location: PACU  Anesthesia Type:General  Level of Consciousness: sedated  Airway & Oxygen Therapy: Patient Spontanous Breathing and Patient connected to face mask oxygen  Post-op Assessment: Report given to RN and Post -op Vital signs reviewed and stable  Post vital signs: Reviewed and stable  Last Vitals:  Vitals:   10/23/16 0646  BP: 140/70  Pulse: 78  Resp: 20  Temp: 36.4 C    Last Pain:  Vitals:   10/23/16 0646  TempSrc: Oral         Complications: No apparent anesthesia complications

## 2016-10-23 NOTE — Anesthesia Procedure Notes (Signed)
Procedure Name: LMA Insertion Date/Time: 10/23/2016 7:28 AM Performed by: Toula Moos L Pre-anesthesia Checklist: Patient identified, Emergency Drugs available, Suction available, Patient being monitored and Timeout performed Patient Re-evaluated:Patient Re-evaluated prior to inductionOxygen Delivery Method: Circle system utilized Preoxygenation: Pre-oxygenation with 100% oxygen Intubation Type: IV induction Ventilation: Mask ventilation without difficulty LMA: LMA inserted LMA Size: 4.0 Number of attempts: 1 Airway Equipment and Method: Bite block Placement Confirmation: positive ETCO2 Tube secured with: Tape Dental Injury: Teeth and Oropharynx as per pre-operative assessment

## 2016-10-23 NOTE — Anesthesia Preprocedure Evaluation (Addendum)
Anesthesia Evaluation  Patient identified by MRN, date of birth, ID band Patient awake    Reviewed: Allergy & Precautions, NPO status , Patient's Chart, lab work & pertinent test results  Airway Mallampati: I  TM Distance: >3 FB Neck ROM: Full    Dental no notable dental hx. (+) Teeth Intact   Pulmonary sleep apnea and Continuous Positive Airway Pressure Ventilation ,    Pulmonary exam normal breath sounds clear to auscultation       Cardiovascular hypertension, Pt. on medications Normal cardiovascular exam Rhythm:Regular Rate:Normal     Neuro/Psych Anxiety TIACVA, No Residual Symptoms    GI/Hepatic negative GI ROS, Neg liver ROS,   Endo/Other  diabetes, Poorly Controlled, Type 2, Insulin Dependent, Oral Hypoglycemic AgentsObesity Hyperlipidemia Hx/o Right Breast Ca-S/P lumpectomy and RT Macromastia  Renal/GU negative Renal ROS  negative genitourinary   Musculoskeletal negative musculoskeletal ROS (+)   Abdominal (+) + obese,   Peds  Hematology negative hematology ROS (+)   Anesthesia Other Findings   Reproductive/Obstetrics                            Anesthesia Physical Anesthesia Plan  ASA: III  Anesthesia Plan: General   Post-op Pain Management:    Induction: Intravenous  PONV Risk Score and Plan: 4 or greater and Ondansetron, Dexamethasone, Propofol, Midazolam, Scopolamine patch - Pre-op and Metaclopromide  Airway Management Planned: Oral ETT  Additional Equipment:   Intra-op Plan:   Post-operative Plan: Extubation in OR  Informed Consent: I have reviewed the patients History and Physical, chart, labs and discussed the procedure including the risks, benefits and alternatives for the proposed anesthesia with the patient or authorized representative who has indicated his/her understanding and acceptance.   Dental advisory given  Plan Discussed with: CRNA, Anesthesiologist  and Surgeon  Anesthesia Plan Comments:        Anesthesia Quick Evaluation

## 2016-10-23 NOTE — H&P (Signed)
Destiny Beasley is an 61 y.o. female.   Chief Complaint: Severe macromastia HPI: Increased back and shoulder pain  Past Medical History:  Diagnosis Date  . Anxiety   . Diabetes mellitus   . History of radiation therapy 06/14/2016 - 07/19/2016   Right Breast 50 Gy 25 fractions  . Hypercholesteremia   . Hypertension   . Malignant neoplasm of upper-outer quadrant of right female breast ([facility]) 02/24/2016  . Sleep apnea    does not use every night  . Stroke [facility])    TIA - Dec 1 st, 2017    Past Surgical History:  Procedure Laterality Date  . ABDOMINAL HYSTERECTOMY    . BRAIN MENINGIOMA EXCISION  2005  . BREAST LUMPECTOMY WITH RADIOACTIVE SEED LOCALIZATION Right 04/20/2016   Procedure: RIGHT BREAST LUMPECTOMY WITH RADIOACTIVE SEED LOCALIZATION;  Surgeon: Fanny Skates, MD;  Location: [facility];  Service: General;  Laterality: Right;  . CESAREAN SECTION    . FOOT SURGERY Bilateral 2016   hammer toe surgery  . MENISCUS REPAIR Left 2015  . PITUITARY SURGERY    . THYROID SURGERY      Family History  Problem Relation Age of Onset  . Diabetes Mother   . Hyperlipidemia Mother   . Hypertension Mother    Social History:  reports that she has never smoked. She has never used smokeless tobacco. She reports that she drinks about 0.6 oz of alcohol per week . She reports that she does not use drugs.  Allergies:  Allergies  Allergen Reactions  . Hydrocodone     Severe anxiety  . Invokana [Canagliflozin] Other (See Comments)    Constant yeast infections    Medications Prior to Admission  Medication Sig Dispense Refill  . Alpha-Lipoic Acid 600 MG CAPS Take 1 capsule by mouth daily.    . Biotin 10000 MCG TABS Take by mouth.    . Cholecalciferol (VITAMIN D-3 PO) Take by mouth.    . Coenzyme Q-10 100 MG capsule Take 100 mg by mouth daily.    Marland Kitchen diltiazem (CARDIZEM CD) 180 MG 24 hr capsule Take 180 mg by mouth daily.    . hydrochlorothiazide (HYDRODIURIL) 25 MG tablet Take  25 mg by mouth daily.    . insulin aspart (NOVOLOG) 100 UNIT/ML injection Inject into the skin 3 (three) times daily before meals. Takes about 50 units per day in pump    . insulin glulisine (APIDRA) 100 UNIT/ML injection Use 50 units per day in pump 30 mL 2  . Multiple Vitamin (MULITIVITAMIN WITH MINERALS) TABS Take 1 tablet by mouth daily.      Marland Kitchen venlafaxine (EFFEXOR-XR) 150 MG 24 hr capsule Take 75 mg by mouth daily.     Marland Kitchen VICTOZA 18 MG/3ML SOPN ADMINISTER 1.8 MG UNDER THE SKIN DAILY 9 mL 0  . aspirin EC 325 MG EC tablet Take 1 tablet (325 mg total) by mouth daily. 30 tablet 0  . DULERA 100-5 MCG/ACT AERO Inhale 2 puffs into the lungs 2 (two) times daily as needed for wheezing or shortness of breath (allergies).     . FREESTYLE TEST STRIPS test strip USE THREE TIMES DAILY TO CHECK BLOOD SUGAR 200 each 2    No results found for this or any previous visit (from the past 58 hour(s)). No results found.  Review of Systems  Constitutional: Negative.   HENT: Negative.   Eyes: Negative.   Respiratory: Negative.   Cardiovascular: Negative.   Gastrointestinal: Negative.   Genitourinary: Negative.  Musculoskeletal: Positive for back pain, myalgias and neck pain.  Skin: Positive for itching and rash.  Neurological: Negative.   Endo/Heme/Allergies: Negative.   Psychiatric/Behavioral: Negative.   All other systems reviewed and are negative.   Blood pressure 140/70, pulse 78, temperature 97.5 F (36.4 C), temperature source Oral, resp. rate 20, height 6' (1.829 m), weight 116.6 kg (257 lb), SpO2 98 %. Physical Exam  Constitutional: She appears well-developed.  HENT:  Head: Normocephalic.  Eyes: Conjunctivae and EOM are normal. Pupils are equal, round, and reactive to light.  Neck: Normal range of motion. Neck supple.  Cardiovascular: Normal rate.   Respiratory: Effort normal and breath sounds normal.  GI: Bowel sounds are normal.  Genitourinary: Vagina normal.  Musculoskeletal: Normal  range of motion.  Neurological: She has normal reflexes.  Skin: Skin is warm.  Psychiatric: She has a normal mood and affect.     Assessment/Plan Severe macromastia  With accessory breast tissue for  Bilateral breast reductions  Nishat Livingston L, MD 10/23/2016, 6:55 AM

## 2016-10-23 NOTE — Discharge Instructions (Signed)
Post Anesthesia Home Care Instructions  Activity: Get plenty of rest for the remainder of the day. A responsible individual must stay with you for 24 hours following the procedure.  For the next 24 hours, DO NOT: -Drive a car -Paediatric nurse -Drink alcoholic beverages -Take any medication unless instructed by your physician -Make any legal decisions or sign important papers.  Meals: Start with liquid foods such as gelatin or soup. Progress to regular foods as tolerated. Avoid greasy, spicy, heavy foods. If nausea and/or vomiting occur, drink only clear liquids until the nausea and/or vomiting subsides. Call your physician if vomiting continues.  Special Instructions/Symptoms: Your throat may feel dry or sore from the anesthesia or the breathing tube placed in your throat during surgery. If this causes discomfort, gargle with warm salt water. The discomfort should disappear within 24 hours.  If you had a scopolamine patch placed behind your ear for the management of post- operative nausea and/or vomiting:  1. The medication in the patch is effective for 72 hours, after which it should be removed.  Wrap patch in a tissue and discard in the trash. Wash hands thoroughly with soap and water. 2. You may remove the patch earlier than 72 hours if you experience unpleasant side effects which may include dry mouth, dizziness or visual disturbances. 3. Avoid touching the patch. Wash your hands with soap and water after contact with the patch.   Breast Reduction Care After Refer to this sheet in the next few weeks. These instructions provide you with information on caring for yourself after your procedure. Your caregiver may also give you more specific instructions. Your treatment has been planned according to current medical practices, but problems sometimes occur. Call your caregiver if you have any problems or questions after your procedure. HOME CARE INSTRUCTIONS  Do not lift more than 5  pounds with one arm, or 10 pounds with both arms, for 1 month.   Do not sleep on your stomach for 4 to 6 weeks.   Do not do vigorous exercise such as bouncing, aerobics, or jumping for 6 weeks. Walking is not restricted.   Do not drive while you are taking prescription pain medicine.   Avoid prolonged sun exposure.   Keep dressings dry and clean  Measure jp drainage every 12 hrs and measure   You may slowly go back to your normal diet. Start with a light meal and increase as comfortable.   You may shower 24 hours after your drains are removed unless instructed differently by your caregiver.   Take your pain medicine as prescribed. Discomfort is normal after breast reduction surgery.   Keep the head of your bed elevated 40 degrees   : Call the office if you notice:  You have a fever.   You notice drainage from the incision that smells bad.   You have persistent pain.   You have persistent bleeding from the incision or nipple discharge.   You develop increased swelling or swelling that is greater in one breast than in the other.  MAKE SURE YOU:   Understand these instructions.   Will watch your condition.   Will get help right away if you are not doing well or get worse.  Document Released: 11/23/2003 Document Revised: 12/21/2010 Document Reviewed: 07/04/2007 [facility] Patient Information 2012 [place].  About my Jackson-Pratt Bulb Drain  What is a Jackson-Pratt bulb? A Jackson-Pratt is a soft, round device used to collect drainage. It is connected to a long, thin drainage  catheter, which is held in place by one or two small stiches near your surgical incision site. When the bulb is squeezed, it forms a vacuum, forcing the drainage to empty into the bulb.  Emptying the Jackson-Pratt bulb- To empty the bulb: 1. Release the plug on the top of the bulb. 2. Pour the bulb's contents into a measuring container which your nurse will provide. 3. Record the time  emptied and amount of drainage. Empty the drain(s) as often as your     doctor or nurse recommends.  Date                  Time                    Amount (Drain 1)                 Amount (Drain 2)  _____________________________________________________________________  _____________________________________________________________________  _____________________________________________________________________  _____________________________________________________________________  _____________________________________________________________________  _____________________________________________________________________  _____________________________________________________________________  _____________________________________________________________________  Squeezing the Jackson-Pratt Bulb- To squeeze the bulb: 1. Make sure the plug at the top of the bulb is open. 2. Squeeze the bulb tightly in your fist. You will hear air squeezing from the bulb. 3. Replace the plug while the bulb is squeezed. 4. Use a safety pin to attach the bulb to your clothing. This will keep the catheter from     pulling at the bulb insertion site.  When to call your doctor- Call your doctor if:  Drain site becomes red, swollen or hot.  You have a fever greater than 101 degrees F.  There is oozing at the drain site.  Drain falls out (apply a guaze bandage over the drain hole and secure it with tape).  Drainage increases daily not related to activity patterns. (You will usually have more drainage when you are active than when you are resting.)  Drainage has a bad odor.

## 2016-10-24 NOTE — Op Note (Signed)
NAME:  Destiny Beasley, Destiny Beasley                     ACCOUNT NO.:  MEDICAL RECORD NO.:  [id number]  LOCATION:                                 FACILITY:  PHYSICIAN:  Emilea Goga L. Towanda Malkin, M.D.   DATE OF BIRTH:  DATE OF PROCEDURE:  10/23/2016 DATE OF DISCHARGE:                              OPERATIVE REPORT   INDICATIONS:  This is a 61 year old lady status post right breast mass excision, referred by Radiology.  The patient has done treatment there and has done well and has severe macromastia.  It has been over 2 years from her treatment, and she wants to have breast reductions using inferior pedicle technique to relieve pressure on her shoulders, back, intertriginous changes underneath the breasts as well as axillary regions.  PROCEDURES DONE:  Bilateral breast reductions using the inferior pedicle technique; excision of accessory breast tissue right and left without her axillary areas, and prepectoral areas.  ANESTHESIA:  General.  Preoperatively, the patient underwent procedures and discussion of procedure, procedure information and potential risks, possible complications, and the goals of the surgery.  She agrees and consents to the surgery.  Understands possible risks, understands no 100% guarantee of results.  DESCRIPTION OF PROCEDURE:  Preoperatively, the patient was sat up and drawn for the inferior pedicle technique.  She was remarked from the nipple-areolar complexes from over 30 cm to 22.  The drawings were completed with drawings of the right and left lateral axillary regions. The patient underwent general anesthesia, intubated orally.  Prep was done to the chest, breast areas, all the way down to the abdominal regions, past umbilicus and axillary regions using Hibiclens soap and solution, walled off with sterile towels and drapes so as make a sterile field.  Tumescent was injected locally.  A total of 500 mL per side, was allowed to sit up.  After this, the wounds were scored  with a #15 blade. The skin of the inferior pedicle was de-epithelialized with #20 blades. Medial and lateral fatty dermal pedicles were excised down to pectoralis major fascia.  Laterally, the axillary regions were liposuctioned using a [place] catheter __________, removing over 350 mL per side.  Next, the hemostasis was maintained, and the superior pedicle was excised. Hemostasis again maintained with the Bovie on coagulation.  The pedicle was trimmed appropriately, and the flaps were transposed and stayed with 3-0 Prolene suture.  Subcutaneous closure was done with 3-0 Monocryl x2 levels and then, a running subcuticular stitch of 3-0 Monocryl and 5-0 Monocryl throughout the inverted T.  The wounds were drained with a #10 fully fluted Blake drains which were placed to the depths of the wound, brought out the lateral portion of the incisions and secured with 3-0 Prolene suture.  The nipple-areolar complexes were examined showing excellent blood supply.  Removal of 1000 g on the each side.  Blood loss less than 250 mL.  Complications, none.  The patient does have an insulin infusion pump and blood sugars were monitored throughout the procedure with no blood sugar over 150.  Steri-Strips and soft dressing were applied and silicon gel patches to all the areas.  She withstood the procedures very well  and was taken to Recovery in excellent condition.  Estimated blood loss, 250 mL.  Complications, none.     Odella Aquas. Towanda Malkin, M.D.     Elie Confer  D:  10/23/2016  T:  10/23/2016  Job:  [id number]

## 2016-10-26 ENCOUNTER — Encounter (HOSPITAL_BASED_OUTPATIENT_CLINIC_OR_DEPARTMENT_OTHER): Payer: Self-pay | Admitting: Specialist

## 2016-10-29 ENCOUNTER — Other Ambulatory Visit: Payer: Self-pay | Admitting: Endocrinology

## 2016-11-10 ENCOUNTER — Other Ambulatory Visit: Payer: Self-pay

## 2016-11-10 MED ORDER — INSULIN ASPART 100 UNIT/ML ~~LOC~~ SOLN: SUBCUTANEOUS | 3 refills | Status: DC

## 2016-11-15 ENCOUNTER — Telehealth: Payer: Self-pay

## 2016-11-15 NOTE — Telephone Encounter (Signed)
Patient using Novolog instead of Apidra per Dr.Kumar's request

## 2016-11-20 ENCOUNTER — Other Ambulatory Visit (INDEPENDENT_AMBULATORY_CARE_PROVIDER_SITE_OTHER): Payer: Managed Care, Other (non HMO)

## 2016-11-20 DIAGNOSIS — Z794 Long term (current) use of insulin: Secondary | ICD-10-CM | POA: Diagnosis not present

## 2016-11-20 DIAGNOSIS — E1165 Type 2 diabetes mellitus with hyperglycemia: Secondary | ICD-10-CM | POA: Diagnosis not present

## 2016-11-20 LAB — COMPREHENSIVE METABOLIC PANEL
ALT: 15 U/L (ref 0–35)
AST: 16 U/L (ref 0–37)
Albumin: 3.8 g/dL (ref 3.5–5.2)
Alkaline Phosphatase: 81 U/L (ref 39–117)
BILIRUBIN TOTAL: 0.4 mg/dL (ref 0.2–1.2)
BUN: 15 mg/dL (ref 6–23)
CALCIUM: 9.2 mg/dL (ref 8.4–10.5)
CO2: 29 mEq/L (ref 19–32)
Chloride: 105 mEq/L (ref 96–112)
Creatinine, Ser: 0.85 mg/dL (ref 0.40–1.20)
GFR: 88.09 mL/min (ref >60.00)
Glucose, Bld: 110 mg/dL — ABNORMAL HIGH (ref 70–99)
Potassium: 3.9 mEq/L (ref 3.5–5.1)
Sodium: 140 mEq/L (ref 135–145)
Total Protein: 7.4 g/dL (ref 6.0–8.3)

## 2016-11-20 LAB — HEMOGLOBIN A1C: Hgb A1c MFr Bld: 8.2 % — ABNORMAL HIGH (ref 4.6–6.5)

## 2016-11-23 ENCOUNTER — Other Ambulatory Visit: Payer: Self-pay | Admitting: Endocrinology

## 2016-11-23 ENCOUNTER — Ambulatory Visit (INDEPENDENT_AMBULATORY_CARE_PROVIDER_SITE_OTHER): Payer: Managed Care, Other (non HMO) | Admitting: Endocrinology

## 2016-11-23 VITALS — BP 130/82 | HR 87 | Ht 72.0 in | Wt 256.0 lb

## 2016-11-23 DIAGNOSIS — Z794 Long term (current) use of insulin: Secondary | ICD-10-CM

## 2016-11-23 DIAGNOSIS — E1165 Type 2 diabetes mellitus with hyperglycemia: Secondary | ICD-10-CM | POA: Diagnosis not present

## 2016-11-23 NOTE — Patient Instructions (Addendum)
Check sugar when eating all meals and some at bedtime or 2 hrs after a meals  Restart exercise  Call if low

## 2016-11-23 NOTE — Progress Notes (Signed)
Patient ID: Destiny Beasley, female   DOB: Jan 16, 1956, 60 y.o.   MRN: [id number]   Reason for Appointment: Type II Diabetes follow-up   History of Present Illness    Date of diagnosis: 10/2008  Previous history: She had markedly increased blood sugars at diagnosis and was tried on oral hypoglycemic drugs and Victoza for about 9 months before starting an insulin.  Since 2011 she had been on basal bolus insulin regimen Previously had had difficulty controlling her diabetes mostly because of difficulty with compliance with various aspects of self-care.  Previous regimen: Toujeo 60 units at 8 am . Apidra 20 units at times  Recent history:   Insulin regimen: Omnipod insulin pump: Basal rate 2.15 until 6 PM and then 2.3 Carbohydrate coverage 1:10 until 6 PM and then 1:9 and active insulin time 4 hours with target 100 Sensitivity 1:25  TOTAL insulin dose 65 units and the basal currently 50.5 units  Non-insulin hypoglycemic drugs: Victoza 1.8 mg daily  Her A1c has been consistently high but appears to be better at 8.2 compared to  10.4         Current management, blood sugar patterns and problems:  She was started on the Omnipod insulin pump on 09/11/16  Although with her pump she is getting better control more recently she has only randomly check blood sugar with mostly monitoring in the morning  She is not monitoring her blood sugar at that time of meals usually  Occasionally will not bolus when she is eating if she does not have her Omnipod with her  More recently she lost her Omnipod for 3 days and she was taking insulin syringe  She has had 3 episodes of minor hypoglycemia in the morning which was asymptomatic, twice after taking a bolus for her snack early morning and once possibly from late evening bolus of 5.5 units  She is still trying to enter carbohydrates at meals for bolusing fairly regularly   However her bolus insulin is only about 15 units per day on an  average recently  She still has not restarted exercise because of recent cosmetic surgery  Side effects from medications: Candidiasis from Invokana  Glucose readings from download  Mean values apply above for all meters except median for One Touch  PRE-MEAL Fasting Lunch Dinner Bedtime Overall  Glucose range: 59-212  85, 149  ?    97-239    Mean/median: 119     128     Exercise:None recently, previously was exercising at gym 3 times a week until Surgery    Dietician visit: Most recent: 04/2010 Weight control:  Wt Readings from Last 3 Encounters:  11/23/16 256 lb (116.1 kg)  10/23/16 257 lb (116.6 kg)  10/11/16 258 lb 3.2 oz (117.1 kg)         Diabetes labs:  Lab Results  Component Value Date   HGBA1C 8.2 (H) 11/20/2016   HGBA1C 10.4 (H) 08/23/2016   HGBA1C 10.0 (H) 05/05/2016   Lab Results  Component Value Date   MICROALBUR 0.8 02/04/2016   LDLCALC 79 05/05/2016   CREATININE 0.85 11/20/2016      No visits with results within 1 Day(s) from this visit.  Latest known visit with results is:  Lab on 11/20/2016  Component Date Value Ref Range Status  . Hgb A1c MFr Bld 11/20/2016 8.2* 4.6 - 6.5 % Final   Glycemic Control Guidelines for People with Diabetes:Non Diabetic:  <6%Goal of Therapy: <7%Additional Action Suggested:  >8%   .  Sodium 11/20/2016 140  135 - 145 mEq/L Final  . Potassium 11/20/2016 3.9  3.5 - 5.1 mEq/L Final  . Chloride 11/20/2016 105  96 - 112 mEq/L Final  . CO2 11/20/2016 29  19 - 32 mEq/L Final  . Glucose, Bld 11/20/2016 110* 70 - 99 mg/dL Final  . BUN 11/20/2016 15  6 - 23 mg/dL Final  . Creatinine, Ser 11/20/2016 0.85  0.40 - 1.20 mg/dL Final  . Total Bilirubin 11/20/2016 0.4  0.2 - 1.2 mg/dL Final  . Alkaline Phosphatase 11/20/2016 81  39 - 117 U/L Final  . AST 11/20/2016 16  0 - 37 U/L Final  . ALT 11/20/2016 15  0 - 35 U/L Final  . Total Protein 11/20/2016 7.4  6.0 - 8.3 g/dL Final  . Albumin 11/20/2016 3.8  3.5 - 5.2 g/dL Final  .  Calcium 11/20/2016 9.2  8.4 - 10.5 mg/dL Final  . GFR 11/20/2016 88.09  >60.00 mL/min Final     Allergies as of 11/23/2016      Reactions   Hydrocodone    Severe anxiety   Invokana [canagliflozin] Other (See Comments)   Constant yeast infections      Medication List       Accurate as of 11/23/16 11:45 AM. Always use your most recent med list.          Alpha-Lipoic Acid 600 MG Caps Take 1 capsule by mouth daily.   Biotin 10000 MCG Tabs Take by mouth.   Coenzyme Q-10 100 MG capsule Take 100 mg by mouth daily.   diltiazem 180 MG 24 hr capsule Commonly known as:  CARDIZEM CD Take 180 mg by mouth daily.   DULERA 100-5 MCG/ACT Aero Generic drug:  mometasone-formoterol Inhale 2 puffs into the lungs 2 (two) times daily as needed for wheezing or shortness of breath (allergies).   FREESTYLE TEST STRIPS test strip Generic drug:  glucose blood USE THREE TIMES DAILY TO CHECK BLOOD SUGAR   hydrochlorothiazide 25 MG tablet Commonly known as:  HYDRODIURIL Take 25 mg by mouth daily.   insulin aspart 100 UNIT/ML injection Commonly known as:  NOVOLOG Inject into the skin 3 (three) times daily before meals. Takes about 50 units per day in pump   insulin glulisine 100 UNIT/ML injection Commonly known as:  APIDRA Use 50 units per day in pump   venlafaxine XR 150 MG 24 hr capsule Commonly known as:  EFFEXOR-XR Take 75 mg by mouth daily.   VICTOZA 18 MG/3ML Sopn Generic drug:  liraglutide ADMINISTER 1.8 MG UNDER THE SKIN DAILY   VITAMIN D-3 PO Take by mouth.       Allergies:  Allergies  Allergen Reactions  . Hydrocodone     Severe anxiety  . Invokana [Canagliflozin] Other (See Comments)    Constant yeast infections    Past Medical History:  Diagnosis Date  . Anxiety   . Diabetes mellitus   . History of radiation therapy 06/14/2016 - 07/19/2016   Right Breast 50 Gy 25 fractions  . Hypercholesteremia   . Hypertension   . Malignant neoplasm of upper-outer  quadrant of right female breast ([place]) 02/24/2016  . Sleep apnea    does not use every night  . Stroke [facility])    TIA - Dec 1 st, 2017    Past Surgical History:  Procedure Laterality Date  . ABDOMINAL HYSTERECTOMY    . BRAIN MENINGIOMA EXCISION  2005  . BREAST LUMPECTOMY WITH RADIOACTIVE SEED LOCALIZATION Right 04/20/2016   Procedure:  RIGHT BREAST LUMPECTOMY WITH RADIOACTIVE SEED LOCALIZATION;  Surgeon: Fanny Skates, MD;  Location: [place];  Service: General;  Laterality: Right;  . BREAST REDUCTION SURGERY Bilateral 10/23/2016   Procedure: BILATERAL BREAST REDUCTION WITH LIPOSUCTION ASSISTANCE;  Surgeon: Cristine Polio, MD;  Location: [place];  Service: Plastics;  Laterality: Bilateral;  . CESAREAN SECTION    . FOOT SURGERY Bilateral 2016   hammer toe surgery  . MENISCUS REPAIR Left 2015  . PITUITARY SURGERY    . THYROID SURGERY      Family History  Problem Relation Age of Onset  . Diabetes Mother   . Hyperlipidemia Mother   . Hypertension Mother     Social History:  reports that she has never smoked. She has never used smokeless tobacco. She reports that she drinks about 0.6 oz of alcohol per week . She reports that she does not use drugs.  Review of Systems:  Hypertension:   is taking Benicar HCT/amlodipine, Blood pressure is Followed by PCP   Lipids:  adequate control of LDL, normal triglycerides  Lab Results  Component Value Date   CHOL 141 05/05/2016   HDL 48.10 05/05/2016   LDLCALC 79 05/05/2016   TRIG 74.0 05/05/2016   CHOLHDL 3 05/05/2016    Diabetic shoe prescription has been given previously For her neuropathic feet   Examination:   BP 130/82   Pulse 87   Ht 6' (1.829 m)   Wt 256 lb (116.1 kg)   SpO2 95%   BMI 34.72 kg/m   Body mass index is 34.72 kg/m.     ASSESSMENT/ PLAN:    Diabetes type 2 with obesity See history of present illness for detailed discussion of current management, blood sugar patterns and  problems identified  As discussed above she is not Checking her blood sugar much at all and not able to comply with all instructions for use of the pump including taking boluses with her meals and keeping her pod on her all the time She was reminded of the need to check blood sugars consistently on a previous visit also  She is however getting better delivery of insulin with the pump compared to supplement it is injections and is primarily getting insulin with her basal amount which is generally about 70+ percent of her total insulin doses in the day  Although she probably can do much better with using a continuous glucose monitor she is still not wanting to purchase the freestyle [place] systems   Recommendations today:   She needs to start exercise when she can She needs to start checking blood sugars with every meal and some readings 2 hours after different meals by rotation and some at bedtime She can try to do the freestyle Libre monitoring at least 10 days out of a month especially right before her office visit Since Apidra is not covered she can continue Novolog Reminded her to keep her pod with her all the time so she can bolus when she is eating a meal or snack Discussed blood sugar targets at various times She will call if she has any tendency to hypoglycemia ; currently her low blood sugars were mostly related to probably over bolusing late at night   HYPERTENSION: She will need to continue to follow-up with PCP  Counseling time on subjects discussed above is over 50% of today's 25 minute visit   Patient Instructions  Check sugar when eating all meals and some at bedtime or 2 hrs after  a meals  Restart exercise  Call if low  Laniece Hornbaker 11/23/2016, 11:45 AM

## 2016-12-18 ENCOUNTER — Other Ambulatory Visit: Payer: Self-pay | Admitting: Endocrinology

## 2016-12-31 ENCOUNTER — Encounter (HOSPITAL_COMMUNITY): Payer: Self-pay | Admitting: *Deleted

## 2016-12-31 ENCOUNTER — Ambulatory Visit (HOSPITAL_COMMUNITY)
Admission: EM | Admit: 2016-12-31 | Discharge: 2016-12-31 | Disposition: A | Discharge status: Home / Self Care | Payer: 59 | Attending: Urgent Care | Admitting: Urgent Care

## 2016-12-31 DIAGNOSIS — M25522 Pain in left elbow: Secondary | ICD-10-CM

## 2016-12-31 DIAGNOSIS — L03114 Cellulitis of left upper limb: Secondary | ICD-10-CM | POA: Diagnosis not present

## 2016-12-31 DIAGNOSIS — E119 Type 2 diabetes mellitus without complications: Secondary | ICD-10-CM

## 2016-12-31 HISTORY — DX: Benign neoplasm of meninges, unspecified: D32.9

## 2016-12-31 HISTORY — DX: Nontoxic goiter, unspecified: E04.9

## 2016-12-31 HISTORY — DX: Neoplasm of unspecified behavior of endocrine glands and other parts of nervous system: D49.7

## 2016-12-31 MED ORDER — DOXYCYCLINE HYCLATE 100 MG PO CAPS: 100.0000 mg | ORAL_CAPSULE | Freq: Two times a day (BID) | ORAL | 0 refills | Status: DC

## 2016-12-31 NOTE — ED Provider Notes (Signed)
    MRN: [id number] DOB: 13-Nov-1955  Subjective:   Destiny Beasley is a 61 y.o. female presenting for chief complaint of Abscess  Reports 1 day history of left upper arm pain, redness, itching. She noted a small vesicular lesion and popped it, has since had these symptoms. Denies fever, chills, n/v, red streaking. She is a type 2 diabetic with a1c ~8%, recently underwent treatment for breast cancer.   Destiny Beasley is allergic to hydrocodone and invokana [canagliflozin].  Also  has a past medical history of Anxiety; Diabetes mellitus; Goiter; History of radiation therapy (06/14/2016 - 07/19/2016); Hypercholesteremia; Hypertension; Malignant neoplasm of upper-outer quadrant of right female breast ([place]) (02/24/2016); Meningioma ([place]); Pituitary tumor; Sleep apnea; and Stroke ([place]). Also  has a past surgical history that includes Cesarean section; Abdominal hysterectomy; Thyroid surgery; Pituitary surgery; Brain meningioma excision (2005); Meniscus repair (Left, 2015); Foot surgery (Bilateral, 2016); Breast lumpectomy with radioactive seed localization (Right, 04/20/2016); and Breast reduction surgery (Bilateral, 10/23/2016).  Objective:   Vitals: BP 138/82   Pulse 88   Temp 98.3 F (36.8 C) (Oral)   Resp 16   SpO2 99%   Physical Exam  Constitutional: She is oriented to person, place, and time. She appears well-developed and well-nourished.  Cardiovascular: Normal rate.   Pulmonary/Chest: Effort normal.  Musculoskeletal:       Left upper arm: She exhibits tenderness and swelling (with associated induration and erythema around 2 small open wounds both less than 0.5cm in diameter as depicted; there was no fluctuance appreciated on exam). She exhibits no bony tenderness, no deformity and no laceration.       Arms: Neurological: She is alert and oriented to person, place, and time.   Assessment and Plan :   Cellulitis of left upper extremity  Arthralgia of left upper arm  Type 2 diabetes mellitus  without complication, without long-term current use of insulin (HCC)  Start doxycycline to cover for cellulitis. Return-to-clinic precautions discussed, patient verbalized understanding. Otherwise, follow up in 2 days for recheck. Patient is traveling out of town until Friday, she will seek follow up at another clinic.  Jaynee Eagles, PA-C [facility] 12/31/2016  8:02 PM   Jaynee Eagles, PA-C 12/31/16 2015

## 2016-12-31 NOTE — ED Triage Notes (Signed)
C/O abscess to left upper arm since yesterday.

## 2017-01-07 ENCOUNTER — Other Ambulatory Visit: Payer: Self-pay | Admitting: Endocrinology

## 2017-01-17 ENCOUNTER — Other Ambulatory Visit: Payer: Self-pay | Admitting: Endocrinology

## 2017-02-16 ENCOUNTER — Other Ambulatory Visit: Payer: Self-pay | Admitting: Endocrinology

## 2017-03-01 ENCOUNTER — Other Ambulatory Visit (INDEPENDENT_AMBULATORY_CARE_PROVIDER_SITE_OTHER): Payer: 59

## 2017-03-01 DIAGNOSIS — E1165 Type 2 diabetes mellitus with hyperglycemia: Secondary | ICD-10-CM

## 2017-03-01 DIAGNOSIS — Z794 Long term (current) use of insulin: Secondary | ICD-10-CM

## 2017-03-01 LAB — BASIC METABOLIC PANEL
BUN: 19 mg/dL (ref 6–23)
CHLORIDE: 103 meq/L (ref 96–112)
CO2: 31 mEq/L (ref 19–32)
CREATININE: 1 mg/dL (ref 0.40–1.20)
Calcium: 10.1 mg/dL (ref 8.4–10.5)
GFR: 72.96 mL/min (ref >60.00)
GLUCOSE: 106 mg/dL — AB (ref 70–99)
POTASSIUM: 3.9 meq/L (ref 3.5–5.1)
Sodium: 140 mEq/L (ref 135–145)

## 2017-03-01 LAB — HEMOGLOBIN A1C: Hgb A1c MFr Bld: 8.4 % — ABNORMAL HIGH (ref 4.6–6.5)

## 2017-03-01 LAB — MICROALBUMIN / CREATININE URINE RATIO
Creatinine,U: 100.3 mg/dL
Microalb Creat Ratio: 0.7 mg/g (ref 0.0–30.0)

## 2017-03-05 ENCOUNTER — Ambulatory Visit (INDEPENDENT_AMBULATORY_CARE_PROVIDER_SITE_OTHER): Payer: 59 | Admitting: Endocrinology

## 2017-03-05 ENCOUNTER — Encounter: Payer: Self-pay | Admitting: Endocrinology

## 2017-03-05 VITALS — BP 140/88 | HR 90 | Ht 72.0 in | Wt 249.4 lb

## 2017-03-05 DIAGNOSIS — Z23 Encounter for immunization: Secondary | ICD-10-CM | POA: Diagnosis not present

## 2017-03-05 DIAGNOSIS — Z794 Long term (current) use of insulin: Secondary | ICD-10-CM | POA: Diagnosis not present

## 2017-03-05 DIAGNOSIS — E1165 Type 2 diabetes mellitus with hyperglycemia: Secondary | ICD-10-CM | POA: Diagnosis not present

## 2017-03-05 MED ORDER — FREESTYLE LIBRE SENSOR SYSTEM MISC: 3 refills | Status: DC

## 2017-03-05 NOTE — Patient Instructions (Addendum)
Low fat diet  Restart exercise

## 2017-03-05 NOTE — Progress Notes (Signed)
Patient ID: Destiny Beasley, female   DOB: Jan 19, 1956, 61 y.o.   MRN: [id number]   Reason for Appointment: Type II Diabetes follow-up   History of Present Illness    Date of diagnosis: 10/2008  Previous history: She had markedly increased blood sugars at diagnosis and was tried on oral hypoglycemic drugs and Victoza for about 9 months before starting an insulin.  Since 2011 she had been on basal bolus insulin regimen Previously had had difficulty controlling her diabetes mostly because of difficulty with compliance with various aspects of self-care.  Previous regimen: Toujeo 60 units at 8 am . Apidra 20 units at times  She was started on the Omnipod insulin pump on 09/11/16  Recent history:   Insulin regimen: Omnipod insulin pump: Basal rate 2.15 until 6 PM and then 2.3 Carbohydrate coverage 1:10 until 6 PM and then 1:9 and active insulin time 4 hours with target 100 Sensitivity 1:25  TOTAL insulin dose 65 units and the basal currently 55.5 units  Non-insulin hypoglycemic drugs: Victoza 1.8 mg daily  Her A1c has been consistently high and is now 8.4, has been as high as 10.4 this year         Current management, blood sugar patterns and problems:  She has not had her pump supplies for her last 10 days and difficult to know what her blood sugar patterns are  Appears to have fairly good blood sugars before meals but mostly high readings after her evening meal  She said that she is trying cut back on carbohydrates during the day but she is frequently eating high-fat meals or pasta dinnertime because she is cooking for the family  Also more recently she has not used mealtime insulin as she did not have syringes for her Novolog  She thinks she is cutting back on portions of snacks late at night by taking Victoza in the evening  Currently taking 70 units of Toujeo insulin only and did not get her pump supplies until Friday, however fasting readings recently are  She still  has not restarted exercise, has not been motivated to do so  She has monitored blood sugars somewhat more than before  She was supposed to start the freestyle Elenor Legato this apparently is too expensive   Side effects from medications: Candidiasis from Invokana  Glucose readings from download  Mean values apply above for all meters except median for One Touch  PRE-MEAL Fasting Lunch 3-9p Bedtime Overall  Glucose range:  86-271   119-237  260  47-279   Mean/median: 156  149  184   162+/-66    POST-MEAL PC Breakfast PC Lunch PC Dinner  Glucose range:     Mean/median:       Exercise:None recently    Dietician visit: Most recent: 04/2010 Weight control:  Wt Readings from Last 3 Encounters:  03/05/17 249 lb 6.4 oz (113.1 kg)  11/23/16 256 lb (116.1 kg)  10/23/16 257 lb (116.6 kg)         Diabetes labs:  Lab Results  Component Value Date   HGBA1C 8.4 (H) 03/01/2017   HGBA1C 8.2 (H) 11/20/2016   HGBA1C 10.4 (H) 08/23/2016   Lab Results  Component Value Date   MICROALBUR <0.7 03/01/2017   LDLCALC 79 05/05/2016   CREATININE 1.00 03/01/2017      No visits with results within 1 Day(s) from this visit.  Latest known visit with results is:  Lab on 03/01/2017  Component Date Value Ref Range Status  .  Microalb, Ur 03/01/2017 <0.7  0.0 - 1.9 mg/dL Final  . Creatinine,U 03/01/2017 100.3  mg/dL Final  . Microalb Creat Ratio 03/01/2017 0.7  0.0 - 30.0 mg/g Final  . Sodium 03/01/2017 140  135 - 145 mEq/L Final  . Potassium 03/01/2017 3.9  3.5 - 5.1 mEq/L Final  . Chloride 03/01/2017 103  96 - 112 mEq/L Final  . CO2 03/01/2017 31  19 - 32 mEq/L Final  . Glucose, Bld 03/01/2017 106* 70 - 99 mg/dL Final  . BUN 03/01/2017 19  6 - 23 mg/dL Final  . Creatinine, Ser 03/01/2017 1.00  0.40 - 1.20 mg/dL Final  . Calcium 03/01/2017 10.1  8.4 - 10.5 mg/dL Final  . GFR 03/01/2017 72.96  >60.00 mL/min Final  . Hgb A1c MFr Bld 03/01/2017 8.4* 4.6 - 6.5 % Final   Glycemic Control Guidelines  for People with Diabetes:Non Diabetic:  <6%Goal of Therapy: <7%Additional Action Suggested:  >8%      Allergies as of 03/05/2017      Reactions   Hydrocodone    Severe anxiety   Invokana [canagliflozin] Other (See Comments)   Constant yeast infections      Medication List        Accurate as of 03/05/17  9:11 AM. Always use your most recent med list.          Alpha-Lipoic Acid 600 MG Caps Take 1 capsule by mouth daily.   Biotin 10000 MCG Tabs Take by mouth.   Coenzyme Q-10 100 MG capsule Take 100 mg by mouth daily.   diltiazem 180 MG 24 hr capsule Commonly known as:  CARDIZEM CD Take 180 mg by mouth daily.   doxycycline 100 MG capsule Commonly known as:  VIBRAMYCIN Take 1 capsule (100 mg total) by mouth 2 (two) times daily.   DULERA 100-5 MCG/ACT Aero Generic drug:  mometasone-formoterol Inhale 2 puffs into the lungs 2 (two) times daily as needed for wheezing or shortness of breath (allergies).   FREESTYLE TEST STRIPS test strip Generic drug:  glucose blood USE THREE TIMES DAILY TO CHECK BLOOD SUGAR   FREESTYLE TEST STRIPS test strip Generic drug:  glucose blood USE THREE TIMES DAILY TO CHECK BLOOD SUGAR   hydrochlorothiazide 25 MG tablet Commonly known as:  HYDRODIURIL Take 25 mg by mouth daily.   insulin aspart 100 UNIT/ML injection Commonly known as:  NOVOLOG Inject into the skin 3 (three) times daily before meals. Takes about 50 units per day in pump   MULTIPLE VITAMIN PO Take 1 tablet daily by mouth.   venlafaxine XR 150 MG 24 hr capsule Commonly known as:  EFFEXOR-XR Take 75 mg by mouth daily.   VICTOZA 18 MG/3ML Sopn Generic drug:  liraglutide ADMINISTER 1.8 MG UNDER THE SKIN DAILY   VITAMIN D-3 PO Take by mouth.       Allergies:  Allergies  Allergen Reactions  . Hydrocodone     Severe anxiety  . Invokana [Canagliflozin] Other (See Comments)    Constant yeast infections    Past Medical History:  Diagnosis Date  . Anxiety   .  Diabetes mellitus   . Goiter   . History of radiation therapy 06/14/2016 - 07/19/2016   Right Breast 50 Gy 25 fractions  . Hypercholesteremia   . Hypertension   . Malignant neoplasm of upper-outer quadrant of right female breast ([place]) 02/24/2016  . Meningioma ([place])   . Pituitary tumor   . Sleep apnea    does not use every night  .  Stroke [facility])    TIA - Dec 1 st, 2017    Past Surgical History:  Procedure Laterality Date  . ABDOMINAL HYSTERECTOMY    . BRAIN MENINGIOMA EXCISION  2005  . CESAREAN SECTION    . FOOT SURGERY Bilateral 2016   hammer toe surgery  . MENISCUS REPAIR Left 2015  . PITUITARY SURGERY    . THYROID SURGERY      Family History  Problem Relation Age of Onset  . Diabetes Mother   . Hyperlipidemia Mother   . Hypertension Mother     Social History:  reports that  has never smoked. she has never used smokeless tobacco. She reports that she drinks about 0.6 oz of alcohol per week. She reports that she does not use drugs.  Review of Systems:  Hypertension:   is taking Benicar HCT/amlodipine, Blood pressure is Followed by PCP   Lipids:  adequate control of LDL, normal triglycerides  Lab Results  Component Value Date   CHOL 141 05/05/2016   HDL 48.10 05/05/2016   LDLCALC 79 05/05/2016   TRIG 74.0 05/05/2016   CHOLHDL 3 05/05/2016    Diabetic shoe prescription has been given previously For her neuropathic feet   Examination:   BP 140/88   Pulse 90   Ht 6' (1.829 m)   Wt 249 lb 6.4 oz (113.1 kg)   SpO2 97%   BMI 33.82 kg/m   Body mass index is 33.82 kg/m.     ASSESSMENT/ PLAN:    Diabetes type 2 with obesity See history of present illness for detailed discussion of current management, blood sugar patterns and problems identified  Her A1c is still over 8% This is despite her using the pump consistently and probably doing better on her boluses and monitoring compared to previously Her main difficulty gaining this to be improving her diet  with high fat meals the evening and snacks periodically especially at night Fasting readings are usually well controlled on her pump but as discussed above there is limited data available since he has not used her pump for the last 10 days Also can do better with exercise regimen   Recommendations today:   She needs to cut back on her high-fat foods Given list of high saturated fat foods also today for improving her caloric control also Start regular exercise with indoor bike She will try to get the freestyle Libre sensor and use it at least once, discussed how to use this and use skin Tac if having problems with adhesion She will again try to control postprandial readings at night with more monitoring Also she may need to add extra doses for higher fat meals if she does have them, as much is 30% more   HYPERTENSION: She will continue to follow-up with PCP  Counseling time on subjects discussed in assessment and plan sections is over 50% of today's 25 minute visit Influenza vaccine given   There are no Patient Instructions on file for this visit. Maleeya Peterkin 03/05/2017, 9:11 AM

## 2017-03-12 ENCOUNTER — Other Ambulatory Visit: Payer: Self-pay | Admitting: Endocrinology

## 2017-03-21 ENCOUNTER — Other Ambulatory Visit: Payer: Self-pay

## 2017-03-21 MED ORDER — EXENATIDE ER 2 MG ~~LOC~~ PEN: PEN_INJECTOR | SUBCUTANEOUS | 5 refills | Status: DC

## 2017-03-27 ENCOUNTER — Other Ambulatory Visit: Payer: Self-pay

## 2017-03-27 ENCOUNTER — Other Ambulatory Visit: Payer: Self-pay | Admitting: Endocrinology

## 2017-03-27 MED ORDER — LIRAGLUTIDE 18 MG/3ML ~~LOC~~ SOPN: PEN_INJECTOR | SUBCUTANEOUS | 2 refills | Status: DC

## 2017-03-27 MED ORDER — LIRAGLUTIDE 18 MG/3ML ~~LOC~~ SOPN: PEN_INJECTOR | SUBCUTANEOUS | 3 refills | Status: DC

## 2017-04-06 ENCOUNTER — Other Ambulatory Visit: Payer: Self-pay | Admitting: Obstetrics and Gynecology

## 2017-04-06 DIAGNOSIS — Z9011 Acquired absence of right breast and nipple: Secondary | ICD-10-CM

## 2017-04-06 DIAGNOSIS — Z853 Personal history of malignant neoplasm of breast: Secondary | ICD-10-CM

## 2017-04-12 ENCOUNTER — Ambulatory Visit
Admission: RE | Admit: 2017-04-12 | Discharge: 2017-04-12 | Disposition: A | Discharge status: Home / Self Care | Payer: 59 | Source: Ambulatory Visit | Attending: Obstetrics and Gynecology | Admitting: Obstetrics and Gynecology

## 2017-04-12 DIAGNOSIS — Z853 Personal history of malignant neoplasm of breast: Secondary | ICD-10-CM

## 2017-04-12 DIAGNOSIS — Z9011 Acquired absence of right breast and nipple: Secondary | ICD-10-CM

## 2017-04-12 HISTORY — DX: Personal history of irradiation: Z92.3

## 2017-04-12 HISTORY — DX: Personal history of antineoplastic chemotherapy: Z92.21

## 2017-05-10 ENCOUNTER — Telehealth: Payer: Self-pay | Admitting: Dietician

## 2017-05-10 NOTE — Telephone Encounter (Signed)
Patient called reporting that she will be out of the pods for the Reader.   Returned her call.  We do not have any in our office.  Provided rep name and number to call to see if they are able to overnight ship some.  Patient's shipment is not until January 24.   Antonieta Iba, RD, LDN, CDE

## 2017-06-05 ENCOUNTER — Encounter: Payer: Self-pay | Admitting: Endocrinology

## 2017-06-08 ENCOUNTER — Encounter: Payer: Self-pay | Admitting: Endocrinology

## 2017-06-08 ENCOUNTER — Other Ambulatory Visit: Payer: 59

## 2017-06-08 ENCOUNTER — Other Ambulatory Visit: Payer: Self-pay

## 2017-06-08 MED ORDER — INSULIN ASPART 100 UNIT/ML ~~LOC~~ SOLN: SUBCUTANEOUS | 3 refills | Status: DC

## 2017-06-09 ENCOUNTER — Encounter: Payer: Self-pay | Admitting: Endocrinology

## 2017-06-11 ENCOUNTER — Telehealth: Payer: Self-pay

## 2017-06-11 ENCOUNTER — Other Ambulatory Visit (INDEPENDENT_AMBULATORY_CARE_PROVIDER_SITE_OTHER): Payer: 59

## 2017-06-11 DIAGNOSIS — Z794 Long term (current) use of insulin: Secondary | ICD-10-CM | POA: Diagnosis not present

## 2017-06-11 DIAGNOSIS — E1165 Type 2 diabetes mellitus with hyperglycemia: Secondary | ICD-10-CM | POA: Diagnosis not present

## 2017-06-11 LAB — LIPID PANEL
CHOL/HDL RATIO: 5
Cholesterol: 222 mg/dL — ABNORMAL HIGH (ref 0–200)
HDL: 45.8 mg/dL (ref >39.00)
LDL CALC: 159 mg/dL — AB (ref 0–99)
NONHDL: 176.19
Triglycerides: 88 mg/dL (ref 0.0–149.0)
VLDL: 17.6 mg/dL (ref 0.0–40.0)

## 2017-06-11 LAB — BASIC METABOLIC PANEL
BUN: 20 mg/dL (ref 6–23)
CHLORIDE: 100 meq/L (ref 96–112)
CO2: 30 mEq/L (ref 19–32)
CREATININE: 1.01 mg/dL (ref 0.40–1.20)
Calcium: 10.1 mg/dL (ref 8.4–10.5)
GFR: 72.06 mL/min (ref >60.00)
Glucose, Bld: 176 mg/dL — ABNORMAL HIGH (ref 70–99)
POTASSIUM: 4.2 meq/L (ref 3.5–5.1)
Sodium: 138 mEq/L (ref 135–145)

## 2017-06-11 LAB — HEMOGLOBIN A1C: Hgb A1c MFr Bld: 8.2 % — ABNORMAL HIGH (ref 4.6–6.5)

## 2017-06-11 NOTE — Telephone Encounter (Signed)
I will discuss with her on her upcoming visit

## 2017-06-11 NOTE — Telephone Encounter (Signed)
Received fax from [place] that the [place] for Freestyle libre sensors was denied per the exclusions and limitations in her coverage plan.

## 2017-06-13 ENCOUNTER — Other Ambulatory Visit: Payer: Self-pay

## 2017-06-13 ENCOUNTER — Encounter: Payer: Self-pay | Admitting: Endocrinology

## 2017-06-13 ENCOUNTER — Ambulatory Visit: Payer: 59 | Admitting: Endocrinology

## 2017-06-13 VITALS — BP 130/84 | HR 89 | Ht 72.0 in | Wt 243.0 lb

## 2017-06-13 DIAGNOSIS — E782 Mixed hyperlipidemia: Secondary | ICD-10-CM

## 2017-06-13 DIAGNOSIS — I10 Essential (primary) hypertension: Secondary | ICD-10-CM | POA: Diagnosis not present

## 2017-06-13 DIAGNOSIS — E1165 Type 2 diabetes mellitus with hyperglycemia: Secondary | ICD-10-CM | POA: Diagnosis not present

## 2017-06-13 DIAGNOSIS — Z794 Long term (current) use of insulin: Secondary | ICD-10-CM

## 2017-06-13 MED ORDER — ROSUVASTATIN CALCIUM 5 MG PO TABS: 5.0000 mg | ORAL_TABLET | Freq: Every day | ORAL | 3 refills | Status: DC

## 2017-06-13 MED ORDER — INSULIN LISPRO 200 UNIT/ML ~~LOC~~ SOPN: 50.0000 [IU] | PEN_INJECTOR | Freq: Every day | SUBCUTANEOUS | 3 refills | Status: DC

## 2017-06-13 MED ORDER — LIRAGLUTIDE 18 MG/3ML ~~LOC~~ SOPN: PEN_INJECTOR | SUBCUTANEOUS | 3 refills | Status: DC

## 2017-06-13 NOTE — Progress Notes (Addendum)
Patient ID: Destiny Beasley, female   DOB: 12/28/1955, 62 y.o.   MRN: [id number]   Reason for Appointment: Type II Diabetes follow-up   History of Present Illness    Date of diagnosis: 10/2008  Previous history: She had markedly increased blood sugars at diagnosis and was tried on oral hypoglycemic drugs and Victoza for about 9 months before starting an insulin.  Since 2011 she had been on basal bolus insulin regimen Previously had had difficulty controlling her diabetes mostly because of difficulty with compliance with various aspects of self-care.  Previous regimen: Toujeo 60 units at 8 am . Apidra 20 units at times  She was started on the Omnipod insulin pump on 09/11/16  Recent history:   Insulin regimen: Omnipod insulin pump: Basal rate 2.1 until 8 AM and then 2.45  Carbohydrate coverage 1:10 until 6 PM and then 1:9 and active insulin time 4 hours with target 100 Sensitivity 1:25  TOTAL insulin dose 65 units and the basal currently 55.5 units  Non-insulin hypoglycemic drugs: Victoza 1.8 mg daily occ  Her A1c has been consistently high and is now 8.4, has been as high as 10.4 this year         Current management, blood sugar patterns and problems:  She now says that she is having difficulty getting the proper amount of insulin from her pharmacy and not having enough to bolus or use with her pump, previously on Novolog and now trying Humalog  Meanwhile she has increased her daytime basal rate on her own somewhat  She also things that she is running short of Victoza as she is getting only 2 pens instead of 3  Most of the time she thinks that her sugars are higher from overeating or getting snacks or high-fat foods  This is however usually better when she takes Victoza consistently  She has marked VARIABILITY in her blood sugar especially late at night based on her diet  BOLUSES inconsistent based on her compliance with this and also trying to use less insulin, her  bolus percent from day-to-day ranges between 18 up to 45% insulin  She is however able to start the freestyle Libre sensor and she is able to pay for it, using 10 day sensor  HIGHEST blood sugars on an average have been between midnight and 2 AM  Lowest blood sugars are usually before lunch  Postprandial readings are appearing higher after lunch but variable after evening meal, usually high even before eating at night   Side effects from medications: Candidiasis from Invokana  Glucose readings from download   Mean values apply above for all meters except median for One Touch  PRE-MEAL Fasting Lunch Dinner Bedtime Overall  Glucose range:  119-256       Mean/median:  181    208  232  194 +/-81    POST-MEAL PC Breakfast PC Lunch PC Dinner  Glucose range:     Mean/median: 149  199  202     CGM use % 76   Average and SD   Time in range 50   % Time Above 180 49   % Time above 250   % Time Below target 1     Exercise: Going to the [employer] recently    Dietician visit: Most recent: 04/2010 Weight control:  Wt Readings from Last 3 Encounters:  06/13/17 243 lb (110.2 kg)  03/05/17 249 lb 6.4 oz (113.1 kg)  11/23/16 256 lb (116.1 kg)  Diabetes labs:  Lab Results  Component Value Date   HGBA1C 8.2 (H) 06/11/2017   HGBA1C 8.4 (H) 03/01/2017   HGBA1C 8.2 (H) 11/20/2016   Lab Results  Component Value Date   MICROALBUR <0.7 03/01/2017   LDLCALC 159 (H) 06/11/2017   CREATININE 1.01 06/11/2017      No visits with results within 1 Day(s) from this visit.  Latest known visit with results is:  Lab on 06/11/2017  Component Date Value Ref Range Status  . Cholesterol 06/11/2017 222* 0 - 200 mg/dL Final   ATP III Classification       Desirable:  < 200 mg/dL               Borderline High:  200 - 239 mg/dL          High:  > = 240 mg/dL  . Triglycerides 06/11/2017 88.0  0.0 - 149.0 mg/dL Final   Normal:  <150 mg/dLBorderline High:  150 - 199 mg/dL  . HDL 06/11/2017 45.80   >39.00 mg/dL Final  . VLDL 06/11/2017 17.6  0.0 - 40.0 mg/dL Final  . LDL Cholesterol 06/11/2017 159* 0 - 99 mg/dL Final  . Total CHOL/HDL Ratio 06/11/2017 5   Final                  Men          Women1/2 Average Risk     3.4          3.3Average Risk          5.0          4.42X Average Risk          9.6          7.13X Average Risk          15.0          11.0                      . NonHDL 06/11/2017 176.19   Final   NOTE:  Non-HDL goal should be 30 mg/dL higher than patient's LDL goal (i.e. LDL goal of < 70 mg/dL, would have non-HDL goal of < 100 mg/dL)  . Sodium 06/11/2017 138  135 - 145 mEq/L Final  . Potassium 06/11/2017 4.2  3.5 - 5.1 mEq/L Final  . Chloride 06/11/2017 100  96 - 112 mEq/L Final  . CO2 06/11/2017 30  19 - 32 mEq/L Final  . Glucose, Bld 06/11/2017 176* 70 - 99 mg/dL Final  . BUN 06/11/2017 20  6 - 23 mg/dL Final  . Creatinine, Ser 06/11/2017 1.01  0.40 - 1.20 mg/dL Final  . Calcium 06/11/2017 10.1  8.4 - 10.5 mg/dL Final  . GFR 06/11/2017 72.06  >60.00 mL/min Final  . Hgb A1c MFr Bld 06/11/2017 8.2* 4.6 - 6.5 % Final   Glycemic Control Guidelines for People with Diabetes:Non Diabetic:  <6%Goal of Therapy: <7%Additional Action Suggested:  >8%      Allergies as of 06/13/2017      Reactions   Hydrocodone    Severe anxiety   Invokana [canagliflozin] Other (See Comments)   Constant yeast infections      Medication List        Accurate as of 06/13/17  9:42 AM. Always use your most recent med list.          Biotin 10000 MCG Tabs Take by mouth.   Coenzyme Q-10 100 MG capsule Take 100 mg by mouth  daily.   diltiazem 180 MG 24 hr capsule Commonly known as:  CARDIZEM CD Take 180 mg by mouth daily.   doxycycline 100 MG capsule Commonly known as:  VIBRAMYCIN Take 1 capsule (100 mg total) by mouth 2 (two) times daily.   DULERA 100-5 MCG/ACT Aero Generic drug:  mometasone-formoterol Inhale 2 puffs into the lungs 2 (two) times daily as needed for wheezing or  shortness of breath (allergies).   [place] Misc Apply to upper arm and change sensor every 10 days   FREESTYLE TEST STRIPS test strip Generic drug:  glucose blood USE THREE TIMES DAILY TO CHECK BLOOD SUGAR   hydrochlorothiazide 25 MG tablet Commonly known as:  HYDRODIURIL Take 25 mg by mouth daily.   insulin aspart 100 UNIT/ML injection Commonly known as:  NOVOLOG Inject about 50 units per day in insulin pump   liraglutide 18 MG/3ML Sopn Commonly known as:  VICTOZA ADMINISTER 1.8 MG UNDER THE SKIN DAILY   MULTIPLE VITAMIN PO Take 1 tablet daily by mouth.   venlafaxine XR 150 MG 24 hr capsule Commonly known as:  EFFEXOR-XR Take 75 mg by mouth daily.   VITAMIN D-3 PO Take by mouth.       Allergies:  Allergies  Allergen Reactions  . Hydrocodone     Severe anxiety  . Invokana [Canagliflozin] Other (See Comments)    Constant yeast infections    Past Medical History:  Diagnosis Date  . Anxiety   . Diabetes mellitus   . Goiter   . History of radiation therapy 06/14/2016 - 07/19/2016   Right Breast 50 Gy 25 fractions  . Hypercholesteremia   . Hypertension   . Malignant neoplasm of upper-outer quadrant of right female breast ([place]) 02/24/2016  . Meningioma ([place])   . Personal history of chemotherapy   . Personal history of radiation therapy   . Pituitary tumor   . Sleep apnea    does not use every night  . Stroke [facility])    TIA - Dec 1 st, 2017    Past Surgical History:  Procedure Laterality Date  . ABDOMINAL HYSTERECTOMY    . BRAIN MENINGIOMA EXCISION  2005  . BREAST BIOPSY    . BREAST LUMPECTOMY Right    2017  . BREAST LUMPECTOMY WITH RADIOACTIVE SEED LOCALIZATION Right 04/20/2016   Procedure: RIGHT BREAST LUMPECTOMY WITH RADIOACTIVE SEED LOCALIZATION;  Surgeon: Fanny Skates, MD;  Location: [facility];  Service: General;  Laterality: Right;  . BREAST REDUCTION SURGERY Bilateral 10/23/2016   Procedure: BILATERAL BREAST  REDUCTION WITH LIPOSUCTION ASSISTANCE;  Surgeon: Cristine Polio, MD;  Location: [facility];  Service: Plastics;  Laterality: Bilateral;  . CESAREAN SECTION    . FOOT SURGERY Bilateral 2016   hammer toe surgery  . MENISCUS REPAIR Left 2015  . PITUITARY SURGERY    . REDUCTION MAMMAPLASTY    . THYROID SURGERY      Family History  Problem Relation Age of Onset  . Diabetes Mother   . Hyperlipidemia Mother   . Hypertension Mother     Social History:  reports that  has never smoked. she has never used smokeless tobacco. She reports that she drinks about 0.6 oz of alcohol per week. She reports that she does not use drugs.  Review of Systems:  Hypertension:   is taking Benicar HCT/amlodipine, Blood pressure is managed by PCP   Lipids:  inadequate control of LDL, normal triglycerides  she previously had been on Crestor and  not clear why she stopped it, not clear if she had any liver issues with this He is not always consistent with her diet with fat restriction  Lab Results  Component Value Date   CHOL 222 (H) 06/11/2017   HDL 45.80 06/11/2017   LDLCALC 159 (H) 06/11/2017   TRIG 88.0 06/11/2017   CHOLHDL 5 06/11/2017   Lab Results  Component Value Date   ALT 15 11/20/2016    Diabetic shoe prescription has been given previously For her neuropathic feet   Examination:   BP 130/84 (BP Location: Left Arm, Patient Position: Sitting, Cuff Size: Normal)   Pulse 89   Ht 6' (1.829 m)   Wt 243 lb (110.2 kg)   SpO2 97%   BMI 32.96 kg/m   Body mass index is 32.96 kg/m.     ASSESSMENT/ PLAN:    Diabetes type 2 with obesity See history of present illness for detailed discussion of current management, blood sugar patterns and problems identified  Her A1c is still over 8%, now 8.2  As discussed above in detail her difficulties are with watching her diet, portions and snacks as well as planning her meals especially with hyperglycemia after lunch and late in the  evening Highest blood sugars are after midnight and lowest blood sugars are mid morning  She is however starting to exercise and is starting to lose weight She does think that Victoza helps control her excessive eating and snacking and overall states that this is useful in diabetes management She is doing variable amount of boluses and sometimes does not have adequate correction for high readings possibly from the type of foods that she is eating  Also having difficulty getting on her supplies with insulin and Victoza and this may be interfering with the patient use of her resources for blood sugar an pump management    Recommendations today:   She needs to watch her diet better She will be given new prescriptions for her Victoza and Humalog so she can get adequate supplies to manage her diabetes Encourage her to cut back on snacks and high fat foods Consistent exercise She probably needs to bolus extra when she is eating higher fat meals or snacks and do correction boluses when blood sugars are high Trial of U-200 insulin that will allow her to change her insulin pump less often and discussed how to adjust her basal rate by 50% lower and boluses will be based on double the carbohydrate ratio Her new basal rates will be 1.1 until 8 AM and 1.2 from 8 AM until midnight  No change in basal rate as yet but hopefully she will have better control once she is compliant with all treatment's and boluses  She was also shown how to do a temporary basal when she is exercising with the aerobic type of exercise   HYPERTENSION: Fair control.  She will continue to follow-up with PCP  LIPIDS: She agrees to go back to Crestor 5 mg daily and will monitor her liver functions while she is taking this Discussed importance of getting LDL below 100 and at least 40-50% reduction from her current baseline  There are no Patient Instructions on file for this visit. Elayne Snare 06/13/2017, 9:42 AM   Counseling  time on subjects discussed in assessment and plan sections is over 50% of today's 25 minute visit

## 2017-06-13 NOTE — Patient Instructions (Signed)
New setting

## 2017-06-25 ENCOUNTER — Other Ambulatory Visit: Payer: Self-pay

## 2017-06-25 ENCOUNTER — Encounter (HOSPITAL_COMMUNITY)
Admission: EM | Disposition: A | Discharge status: Home / Self Care | Payer: Self-pay | Source: Home / Self Care | Attending: Cardiovascular Disease

## 2017-06-25 ENCOUNTER — Encounter (HOSPITAL_COMMUNITY): Payer: Self-pay | Admitting: Cardiovascular Disease

## 2017-06-25 ENCOUNTER — Inpatient Hospital Stay (HOSPITAL_COMMUNITY)
Admission: EM | Admit: 2017-06-25 | Discharge: 2017-06-29 | DRG: 246 | Disposition: A | Discharge status: Home / Self Care | Payer: 59 | Attending: Cardiovascular Disease | Admitting: Cardiovascular Disease

## 2017-06-25 DIAGNOSIS — I5022 Chronic systolic (congestive) heart failure: Secondary | ICD-10-CM

## 2017-06-25 DIAGNOSIS — Z833 Family history of diabetes mellitus: Secondary | ICD-10-CM | POA: Diagnosis not present

## 2017-06-25 DIAGNOSIS — Z9641 Presence of insulin pump (external) (internal): Secondary | ICD-10-CM | POA: Diagnosis present

## 2017-06-25 DIAGNOSIS — Z923 Personal history of irradiation: Secondary | ICD-10-CM | POA: Diagnosis not present

## 2017-06-25 DIAGNOSIS — Z853 Personal history of malignant neoplasm of breast: Secondary | ICD-10-CM | POA: Diagnosis not present

## 2017-06-25 DIAGNOSIS — Z90710 Acquired absence of both cervix and uterus: Secondary | ICD-10-CM | POA: Diagnosis not present

## 2017-06-25 DIAGNOSIS — Z87898 Personal history of other specified conditions: Secondary | ICD-10-CM | POA: Diagnosis present

## 2017-06-25 DIAGNOSIS — Z9221 Personal history of antineoplastic chemotherapy: Secondary | ICD-10-CM | POA: Diagnosis not present

## 2017-06-25 DIAGNOSIS — I5021 Acute systolic (congestive) heart failure: Secondary | ICD-10-CM | POA: Diagnosis present

## 2017-06-25 DIAGNOSIS — E785 Hyperlipidemia, unspecified: Secondary | ICD-10-CM | POA: Diagnosis present

## 2017-06-25 DIAGNOSIS — Z79899 Other long term (current) drug therapy: Secondary | ICD-10-CM

## 2017-06-25 DIAGNOSIS — I2510 Atherosclerotic heart disease of native coronary artery without angina pectoris: Secondary | ICD-10-CM

## 2017-06-25 DIAGNOSIS — I2119 ST elevation (STEMI) myocardial infarction involving other coronary artery of inferior wall: Secondary | ICD-10-CM

## 2017-06-25 DIAGNOSIS — Z955 Presence of coronary angioplasty implant and graft: Secondary | ICD-10-CM

## 2017-06-25 DIAGNOSIS — Z8249 Family history of ischemic heart disease and other diseases of the circulatory system: Secondary | ICD-10-CM | POA: Diagnosis not present

## 2017-06-25 DIAGNOSIS — Z8673 Personal history of transient ischemic attack (TIA), and cerebral infarction without residual deficits: Secondary | ICD-10-CM | POA: Diagnosis not present

## 2017-06-25 DIAGNOSIS — E119 Type 2 diabetes mellitus without complications: Secondary | ICD-10-CM | POA: Diagnosis present

## 2017-06-25 DIAGNOSIS — Z8679 Personal history of other diseases of the circulatory system: Secondary | ICD-10-CM

## 2017-06-25 DIAGNOSIS — R570 Cardiogenic shock: Secondary | ICD-10-CM | POA: Diagnosis present

## 2017-06-25 DIAGNOSIS — G4733 Obstructive sleep apnea (adult) (pediatric): Secondary | ICD-10-CM

## 2017-06-25 DIAGNOSIS — Z9989 Dependence on other enabling machines and devices: Secondary | ICD-10-CM

## 2017-06-25 DIAGNOSIS — Z86011 Personal history of benign neoplasm of the brain: Secondary | ICD-10-CM

## 2017-06-25 DIAGNOSIS — F419 Anxiety disorder, unspecified: Secondary | ICD-10-CM | POA: Diagnosis present

## 2017-06-25 DIAGNOSIS — D649 Anemia, unspecified: Secondary | ICD-10-CM | POA: Diagnosis present

## 2017-06-25 DIAGNOSIS — E876 Hypokalemia: Secondary | ICD-10-CM | POA: Diagnosis present

## 2017-06-25 DIAGNOSIS — I2121 ST elevation (STEMI) myocardial infarction involving left circumflex coronary artery: Principal | ICD-10-CM | POA: Diagnosis present

## 2017-06-25 DIAGNOSIS — I110 Hypertensive heart disease with heart failure: Secondary | ICD-10-CM | POA: Diagnosis present

## 2017-06-25 DIAGNOSIS — I442 Atrioventricular block, complete: Secondary | ICD-10-CM

## 2017-06-25 DIAGNOSIS — I10 Essential (primary) hypertension: Secondary | ICD-10-CM | POA: Diagnosis present

## 2017-06-25 DIAGNOSIS — E7800 Pure hypercholesterolemia, unspecified: Secondary | ICD-10-CM | POA: Diagnosis present

## 2017-06-25 DIAGNOSIS — E669 Obesity, unspecified: Secondary | ICD-10-CM | POA: Diagnosis present

## 2017-06-25 DIAGNOSIS — Z794 Long term (current) use of insulin: Secondary | ICD-10-CM

## 2017-06-25 DIAGNOSIS — R079 Chest pain, unspecified: Secondary | ICD-10-CM | POA: Diagnosis present

## 2017-06-25 DIAGNOSIS — I252 Old myocardial infarction: Secondary | ICD-10-CM | POA: Diagnosis present

## 2017-06-25 HISTORY — DX: Atherosclerotic heart disease of native coronary artery without angina pectoris: I25.10

## 2017-06-25 HISTORY — DX: Atrioventricular block, complete: I44.2

## 2017-06-25 HISTORY — DX: Obesity, unspecified: E66.9

## 2017-06-25 HISTORY — DX: Ischemic cardiomyopathy: I25.5

## 2017-06-25 HISTORY — PX: CORONARY STENT INTERVENTION: CATH118234

## 2017-06-25 HISTORY — PX: CORONARY/GRAFT ACUTE MI REVASCULARIZATION: CATH118305

## 2017-06-25 HISTORY — PX: LEFT HEART CATH AND CORONARY ANGIOGRAPHY: CATH118249

## 2017-06-25 HISTORY — DX: Chronic systolic (congestive) heart failure: I50.22

## 2017-06-25 HISTORY — DX: Anemia, unspecified: D64.9

## 2017-06-25 HISTORY — PX: RIGHT HEART CATH: CATH118263

## 2017-06-25 LAB — CBC
HCT: 35.1 % — ABNORMAL LOW (ref 36.0–46.0)
HEMOGLOBIN: 11.2 g/dL — AB (ref 12.0–15.0)
MCH: 28.5 pg (ref 26.0–34.0)
MCHC: 31.9 g/dL (ref 30.0–36.0)
MCV: 89.3 fL (ref 78.0–100.0)
Platelets: 314 10*3/uL (ref 150–400)
RBC: 3.93 MIL/uL (ref 3.87–5.11)
RDW: 15 % (ref 11.5–15.5)
WBC: 7.7 10*3/uL (ref 4.0–10.5)

## 2017-06-25 LAB — POCT I-STAT 3, VENOUS BLOOD GAS (G3P V)
BICARBONATE: 25 mmol/L (ref 20.0–28.0)
O2 SAT: 65 %
PCO2 VEN: 40.9 mmHg — AB (ref 44.0–60.0)
PH VEN: 7.394 (ref 7.250–7.430)
PO2 VEN: 34 mmHg (ref 32.0–45.0)
TCO2: 26 mmol/L (ref 22–32)

## 2017-06-25 LAB — APTT: aPTT: 24 seconds (ref 24–36)

## 2017-06-25 LAB — POCT I-STAT 3, ART BLOOD GAS (G3+)
ACID-BASE DEFICIT: 1 mmol/L (ref 0.0–2.0)
Bicarbonate: 23.2 mmol/L (ref 20.0–28.0)
O2 Saturation: 91 %
PH ART: 7.416 (ref 7.350–7.450)
TCO2: 24 mmol/L (ref 22–32)
pCO2 arterial: 36 mmHg (ref 32.0–48.0)
pO2, Arterial: 59 mmHg — ABNORMAL LOW (ref 83.0–108.0)

## 2017-06-25 LAB — POCT ACTIVATED CLOTTING TIME
ACTIVATED CLOTTING TIME: 384 s
Activated Clotting Time: 147 seconds

## 2017-06-25 LAB — COMPREHENSIVE METABOLIC PANEL
ALT: 16 U/L (ref 14–54)
ANION GAP: 12 (ref 5–15)
AST: 22 U/L (ref 15–41)
Albumin: 3.3 g/dL — ABNORMAL LOW (ref 3.5–5.0)
Alkaline Phosphatase: 74 U/L (ref 38–126)
BILIRUBIN TOTAL: 0.5 mg/dL (ref 0.3–1.2)
BUN: 13 mg/dL (ref 6–20)
CALCIUM: 8.8 mg/dL — AB (ref 8.9–10.3)
CO2: 21 mmol/L — ABNORMAL LOW (ref 22–32)
Chloride: 104 mmol/L (ref 101–111)
Creatinine, Ser: 1.07 mg/dL — ABNORMAL HIGH (ref 0.44–1.00)
GFR, EST NON AFRICAN AMERICAN: 56 mL/min — AB (ref >60)
Glucose, Bld: 276 mg/dL — ABNORMAL HIGH (ref 65–99)
Potassium: 3.9 mmol/L (ref 3.5–5.1)
Sodium: 137 mmol/L (ref 135–145)
TOTAL PROTEIN: 6.2 g/dL — AB (ref 6.5–8.1)

## 2017-06-25 LAB — TROPONIN I
TROPONIN I: 0.27 ng/mL — AB (ref <0.03)
TROPONIN I: 22.13 ng/mL — AB (ref <0.03)
Troponin I: >65 ng/mL (ref <0.03)

## 2017-06-25 LAB — HEMOGLOBIN A1C
HEMOGLOBIN A1C: 7.5 % — AB (ref 4.8–5.6)
Mean Plasma Glucose: 168.55 mg/dL

## 2017-06-25 LAB — GLUCOSE, CAPILLARY
GLUCOSE-CAPILLARY: 227 mg/dL — AB (ref 65–99)
Glucose-Capillary: 236 mg/dL — ABNORMAL HIGH (ref 65–99)
Glucose-Capillary: 210 mg/dL — ABNORMAL HIGH (ref 65–99)
Glucose-Capillary: 147 mg/dL — ABNORMAL HIGH (ref 65–99)

## 2017-06-25 LAB — CBG MONITORING, ED: Glucose-Capillary: 190 mg/dL — ABNORMAL HIGH (ref 65–99)

## 2017-06-25 LAB — PROTIME-INR
INR: 1.09
PROTHROMBIN TIME: 14 s (ref 11.4–15.2)

## 2017-06-25 LAB — BRAIN NATRIURETIC PEPTIDE: B Natriuretic Peptide: 58 pg/mL (ref 0.0–100.0)

## 2017-06-25 LAB — LIPID PANEL
Cholesterol: 104 mg/dL (ref 0–200)
HDL: 36 mg/dL — AB (ref >40)
LDL Cholesterol: 50 mg/dL (ref 0–99)
TRIGLYCERIDES: 92 mg/dL (ref <150)
Total CHOL/HDL Ratio: 2.9 RATIO
VLDL: 18 mg/dL (ref 0–40)

## 2017-06-25 LAB — MRSA PCR SCREENING: MRSA BY PCR: NEGATIVE

## 2017-06-25 SURGERY — LEFT HEART CATH AND CORONARY ANGIOGRAPHY
Anesthesia: LOCAL

## 2017-06-25 MED ORDER — LIRAGLUTIDE 18 MG/3ML ~~LOC~~ SOPN
1.8000 mg | PEN_INJECTOR | Freq: Every day | SUBCUTANEOUS | Status: DC
  Administered 2017-06-25 – 2017-06-29 (×5): 1.8 mg via SUBCUTANEOUS
  Filled 2017-06-25 (×3): qty 3

## 2017-06-25 MED ORDER — SODIUM CHLORIDE 0.9 % IV SOLN: 250.0000 mL | INTRAVENOUS | Status: DC | PRN

## 2017-06-25 MED ORDER — SODIUM CHLORIDE 0.9 % IV SOLN
INTRAVENOUS | Status: AC | PRN
  Administered 2017-06-25: 999 mL via INTRAVENOUS

## 2017-06-25 MED ORDER — TICAGRELOR 90 MG PO TABS
ORAL_TABLET | ORAL | Status: AC
  Filled 2017-06-25: qty 2

## 2017-06-25 MED ORDER — HEPARIN (PORCINE) IN NACL 2-0.9 UNIT/ML-% IJ SOLN
INTRAMUSCULAR | Status: AC
  Filled 2017-06-25: qty 1000

## 2017-06-25 MED ORDER — LABETALOL HCL 5 MG/ML IV SOLN: 10.0000 mg | INTRAVENOUS | Status: AC | PRN

## 2017-06-25 MED ORDER — NITROGLYCERIN 1 MG/10 ML FOR IR/CATH LAB
INTRA_ARTERIAL | Status: AC
  Filled 2017-06-25: qty 10

## 2017-06-25 MED ORDER — ATORVASTATIN CALCIUM 80 MG PO TABS
80.0000 mg | ORAL_TABLET | Freq: Every day | ORAL | Status: DC
  Administered 2017-06-25 – 2017-06-28 (×4): 80 mg via ORAL
  Filled 2017-06-25 (×4): qty 1

## 2017-06-25 MED ORDER — SODIUM CHLORIDE 0.9 % IV SOLN: INTRAVENOUS | Status: AC

## 2017-06-25 MED ORDER — ONDANSETRON HCL 4 MG/2ML IJ SOLN
4.0000 mg | Freq: Four times a day (QID) | INTRAMUSCULAR | Status: DC | PRN
  Administered 2017-06-25: 4 mg via INTRAVENOUS
  Filled 2017-06-25: qty 2

## 2017-06-25 MED ORDER — DIAZEPAM 5 MG PO TABS
5.0000 mg | ORAL_TABLET | Freq: Four times a day (QID) | ORAL | Status: DC | PRN
  Administered 2017-06-25 – 2017-06-28 (×4): 5 mg via ORAL
  Filled 2017-06-25 (×5): qty 1

## 2017-06-25 MED ORDER — ONDANSETRON HCL 4 MG/2ML IJ SOLN
INTRAMUSCULAR | Status: AC
  Filled 2017-06-25: qty 2

## 2017-06-25 MED ORDER — FENTANYL CITRATE (PF) 100 MCG/2ML IJ SOLN
INTRAMUSCULAR | Status: DC | PRN
  Administered 2017-06-25: 25 ug via INTRAVENOUS

## 2017-06-25 MED ORDER — TICAGRELOR 90 MG PO TABS
ORAL_TABLET | ORAL | Status: DC | PRN
  Administered 2017-06-25: 180 mg via ORAL

## 2017-06-25 MED ORDER — NITROGLYCERIN 0.4 MG SL SUBL: 0.4000 mg | SUBLINGUAL_TABLET | SUBLINGUAL | Status: DC | PRN

## 2017-06-25 MED ORDER — FENTANYL CITRATE (PF) 100 MCG/2ML IJ SOLN
INTRAMUSCULAR | Status: AC
  Filled 2017-06-25: qty 2

## 2017-06-25 MED ORDER — SODIUM CHLORIDE 0.9 % IV SOLN
INTRAVENOUS | Status: AC | PRN
  Administered 2017-06-25: 4 ug/kg/min via INTRAVENOUS

## 2017-06-25 MED ORDER — ONDANSETRON HCL 4 MG/2ML IJ SOLN
INTRAMUSCULAR | Status: DC | PRN
  Administered 2017-06-25: 4 mg via INTRAVENOUS

## 2017-06-25 MED ORDER — SODIUM CHLORIDE 0.9 % IV SOLN
4.0000 ug/kg/min | INTRAVENOUS | Status: DC
  Filled 2017-06-25: qty 50

## 2017-06-25 MED ORDER — MOMETASONE FURO-FORMOTEROL FUM 100-5 MCG/ACT IN AERO
2.0000 | INHALATION_SPRAY | Freq: Two times a day (BID) | RESPIRATORY_TRACT | Status: DC
  Administered 2017-06-26 – 2017-06-29 (×6): 2 via RESPIRATORY_TRACT
  Filled 2017-06-25 (×2): qty 8.8

## 2017-06-25 MED ORDER — METOPROLOL TARTRATE 12.5 MG HALF TABLET
12.5000 mg | ORAL_TABLET | Freq: Two times a day (BID) | ORAL | Status: DC
  Filled 2017-06-25: qty 1

## 2017-06-25 MED ORDER — LIDOCAINE HCL (CARDIAC) 20 MG/ML IV SOLN
INTRAVENOUS | Status: AC
  Filled 2017-06-25: qty 5

## 2017-06-25 MED ORDER — METOCLOPRAMIDE HCL 5 MG/5ML PO SOLN
5.0000 mg | Freq: Once | ORAL | Status: AC
  Administered 2017-06-25: 5 mg via ORAL
  Filled 2017-06-25 (×2): qty 5

## 2017-06-25 MED ORDER — ALUM & MAG HYDROXIDE-SIMETH 200-200-20 MG/5ML PO SUSP
30.0000 mL | Freq: Four times a day (QID) | ORAL | Status: DC | PRN
  Administered 2017-06-25: 30 mL via ORAL
  Filled 2017-06-25: qty 30

## 2017-06-25 MED ORDER — SODIUM CHLORIDE 0.9 % IV SOLN
INTRAVENOUS | Status: DC | PRN
  Administered 2017-06-25 (×2): 1.75 mg/kg/h via INTRAVENOUS

## 2017-06-25 MED ORDER — HEPARIN SODIUM (PORCINE) 5000 UNIT/ML IJ SOLN
5000.0000 [IU] | Freq: Three times a day (TID) | INTRAMUSCULAR | Status: DC
  Administered 2017-06-25 – 2017-06-28 (×9): 5000 [IU] via SUBCUTANEOUS
  Filled 2017-06-25 (×9): qty 1

## 2017-06-25 MED ORDER — MAGNESIUM HYDROXIDE 400 MG/5ML PO SUSP
30.0000 mL | Freq: Every day | ORAL | Status: DC | PRN
  Administered 2017-06-25: 30 mL via ORAL
  Filled 2017-06-25: qty 30

## 2017-06-25 MED ORDER — IOPAMIDOL (ISOVUE-370) INJECTION 76%
INTRAVENOUS | Status: DC | PRN
  Administered 2017-06-25: 140 mL via INTRA_ARTERIAL

## 2017-06-25 MED ORDER — IOPAMIDOL (ISOVUE-370) INJECTION 76%
INTRAVENOUS | Status: AC
  Filled 2017-06-25: qty 150

## 2017-06-25 MED ORDER — HYDRALAZINE HCL 20 MG/ML IJ SOLN: 5.0000 mg | INTRAMUSCULAR | Status: AC | PRN

## 2017-06-25 MED ORDER — INSULIN PUMP
Freq: Three times a day (TID) | SUBCUTANEOUS | Status: DC
  Administered 2017-06-25 – 2017-06-27 (×12): via SUBCUTANEOUS
  Administered 2017-06-28: 3.3 via SUBCUTANEOUS
  Administered 2017-06-28: 1.1 via SUBCUTANEOUS
  Administered 2017-06-28: 4.2 via SUBCUTANEOUS
  Filled 2017-06-25: qty 1

## 2017-06-25 MED ORDER — LIDOCAINE HCL (PF) 1 % IJ SOLN
INTRAMUSCULAR | Status: DC | PRN
  Administered 2017-06-25: 20 mL

## 2017-06-25 MED ORDER — SODIUM CHLORIDE 0.9 % IV SOLN
INTRAVENOUS | Status: AC | PRN
  Administered 2017-06-25: 10 mL/h via INTRAVENOUS

## 2017-06-25 MED ORDER — SODIUM CHLORIDE 0.9% FLUSH
3.0000 mL | Freq: Two times a day (BID) | INTRAVENOUS | Status: DC
  Administered 2017-06-25 – 2017-06-29 (×9): 3 mL via INTRAVENOUS

## 2017-06-25 MED ORDER — ACETAMINOPHEN 325 MG PO TABS
650.0000 mg | ORAL_TABLET | ORAL | Status: DC | PRN
  Administered 2017-06-26 – 2017-06-29 (×6): 650 mg via ORAL
  Filled 2017-06-25 (×5): qty 2

## 2017-06-25 MED ORDER — BIVALIRUDIN TRIFLUOROACETATE 250 MG IV SOLR
INTRAVENOUS | Status: AC
  Filled 2017-06-25: qty 250

## 2017-06-25 MED ORDER — CANGRELOR TETRASODIUM 50 MG IV SOLR
INTRAVENOUS | Status: AC
  Filled 2017-06-25: qty 50

## 2017-06-25 MED ORDER — BIVALIRUDIN BOLUS VIA INFUSION - CUPID
INTRAVENOUS | Status: DC | PRN
  Administered 2017-06-25: 82.65 mg via INTRAVENOUS

## 2017-06-25 MED ORDER — SODIUM CHLORIDE 0.9% FLUSH: 3.0000 mL | INTRAVENOUS | Status: DC | PRN

## 2017-06-25 MED ORDER — LIDOCAINE HCL (CARDIAC) 20 MG/ML IV SOLN
INTRAVENOUS | Status: DC | PRN
  Administered 2017-06-25: 75 mg via INTRAVENOUS

## 2017-06-25 MED ORDER — DOPAMINE-DEXTROSE 3.2-5 MG/ML-% IV SOLN
INTRAVENOUS | Status: AC | PRN
  Administered 2017-06-25: 10 ug/kg/min via INTRAVENOUS

## 2017-06-25 MED ORDER — CANGRELOR BOLUS VIA INFUSION
INTRAVENOUS | Status: DC | PRN
  Administered 2017-06-25: 3306 ug via INTRAVENOUS

## 2017-06-25 MED ORDER — TICAGRELOR 90 MG PO TABS
90.0000 mg | ORAL_TABLET | Freq: Two times a day (BID) | ORAL | Status: DC
  Administered 2017-06-25 – 2017-06-29 (×8): 90 mg via ORAL
  Filled 2017-06-25 (×8): qty 1

## 2017-06-25 MED ORDER — VENLAFAXINE HCL ER 75 MG PO CP24
75.0000 mg | ORAL_CAPSULE | Freq: Every day | ORAL | Status: DC
  Administered 2017-06-25 – 2017-06-29 (×5): 75 mg via ORAL
  Filled 2017-06-25 (×5): qty 1

## 2017-06-25 MED ORDER — LIDOCAINE HCL 1 % IJ SOLN
INTRAMUSCULAR | Status: AC
  Filled 2017-06-25: qty 20

## 2017-06-25 MED ORDER — SODIUM CHLORIDE 0.9 % IV SOLN
INTRAVENOUS | Status: DC | PRN
  Administered 2017-06-25: 100 mL/h via INTRAVENOUS

## 2017-06-25 MED ORDER — ASPIRIN EC 81 MG PO TBEC
81.0000 mg | DELAYED_RELEASE_TABLET | Freq: Every day | ORAL | Status: DC
  Administered 2017-06-26 – 2017-06-29 (×4): 81 mg via ORAL
  Filled 2017-06-25 (×4): qty 1

## 2017-06-25 MED ORDER — DOPAMINE-DEXTROSE 3.2-5 MG/ML-% IV SOLN: 0.0000 ug/kg/min | INTRAVENOUS | Status: DC

## 2017-06-25 MED ORDER — HEPARIN (PORCINE) IN NACL 2-0.9 UNIT/ML-% IJ SOLN
INTRAMUSCULAR | Status: AC | PRN
  Administered 2017-06-25 (×3): 500 mL

## 2017-06-25 MED ORDER — MOMETASONE FURO-FORMOTEROL FUM 100-5 MCG/ACT IN AERO
2.0000 | INHALATION_SPRAY | Freq: Two times a day (BID) | RESPIRATORY_TRACT | Status: DC
  Filled 2017-06-25: qty 8.8

## 2017-06-25 MED ORDER — DOPAMINE-DEXTROSE 3.2-5 MG/ML-% IV SOLN
INTRAVENOUS | Status: AC
  Filled 2017-06-25: qty 250

## 2017-06-25 SURGICAL SUPPLY — 26 items
BALLN ~~LOC~~ EMERGE MR 4.0X15 (BALLOONS) ×2
BALLOON ~~LOC~~ EMERGE MR 4.0X15 (BALLOONS) ×1 IMPLANT
CATH EXTRAC PRONTO 5.5F 138CM (CATHETERS) ×2 IMPLANT
CATH INFINITI 5FR MULTPACK ANG (CATHETERS) ×2 IMPLANT
CATH LAUNCHER 6FR EBU3.5 (CATHETERS) ×2 IMPLANT
CATH S G BIP PACING (SET/KITS/TRAYS/PACK) ×2 IMPLANT
CATH SWAN GANZ 7F STRAIGHT (CATHETERS) ×2 IMPLANT
COVER PRB 48X5XTLSCP FOLD TPE (BAG) ×1 IMPLANT
COVER PROBE 5X48 (BAG) ×1
DEVICE CLOSURE PERCLS PRGLD 6F (VASCULAR PRODUCTS) ×1 IMPLANT
GLIDESHEATH SLEND SS 6F .021 (SHEATH) IMPLANT
GUIDEWIRE INQWIRE 1.5J.035X260 (WIRE) IMPLANT
INQWIRE 1.5J .035X260CM (WIRE)
KIT ENCORE 26 ADVANTAGE (KITS) ×2 IMPLANT
KIT HEART LEFT (KITS) ×2 IMPLANT
PACK CARDIAC CATHETERIZATION (CUSTOM PROCEDURE TRAY) ×2 IMPLANT
PERCLOSE PROGLIDE 6F (VASCULAR PRODUCTS) ×2
SHEATH PINNACLE 6F 10CM (SHEATH) ×4 IMPLANT
SHEATH PINNACLE 7F 10CM (SHEATH) ×2 IMPLANT
SLEEVE REPOSITIONING LENGTH 30 (MISCELLANEOUS) ×2 IMPLANT
STENT SYNERGY DES 4X20 (Permanent Stent) ×2 IMPLANT
SYR MEDRAD MARK V 150ML (SYRINGE) ×2 IMPLANT
TRANSDUCER W/STOPCOCK (MISCELLANEOUS) ×2 IMPLANT
TUBING CIL FLEX 10 FLL-RA (TUBING) ×2 IMPLANT
WIRE COUGAR XT STRL 190CM (WIRE) ×2 IMPLANT
WIRE EMERALD 3MM-J .035X150CM (WIRE) ×2 IMPLANT

## 2017-06-25 NOTE — H&P (Signed)
Cardiology Admission History and Physical:   Patient ID: Destiny Beasley; MRN: [id number]; DOB: 06/12/1955   Admission date: 06/25/2017  Primary Care Provider: Lawerance Cruel, MD Primary Cardiologist: No primary care provider on file.   Chief Complaint:  Chest pain  Patient Profile:   Destiny Beasley is a 62 y.o. female with a history of Type II DM on insulin presenting with an acute inferolateral STEMI complicated by complete heart block and shock  History of Present Illness:   [name] is a 62 year old woman with history of type 2 diabetes on an insulin pump.  She developed chest pain approximately 6-7 hours prior to admission.  During her evaluation in the emergency department, she developed severe hypotension and complete heart block.  I was asked to evaluate her emergently.  An EKG demonstrated complete heart block with an inferolateral STEMI pattern.  At the time of my evaluation, the patient is lethargic and hypotensive.  She is complaining of chest pain but really is unable to provide much history at all.  She is being transcutaneously paced.  After a very brief discussion with her family, the patient is brought emergently to the cardiac catheterization lab in cardiogenic shock with plans for cardiac cath, PCI, temporary pacing, and hemodynamic support.   Past Medical History:  Diagnosis Date  . Anxiety   . Diabetes mellitus   . Goiter   . History of radiation therapy 06/14/2016 - 07/19/2016   Right Breast 50 Gy 25 fractions  . Hypercholesteremia   . Hypertension   . Malignant neoplasm of upper-outer quadrant of right female breast ([place]) 02/24/2016  . Meningioma ([place])   . Personal history of chemotherapy   . Personal history of radiation therapy   . Pituitary tumor   . Sleep apnea    does not use every night  . Stroke [facility])    TIA - Dec 1 st, 2017    Past Surgical History:  Procedure Laterality Date  . ABDOMINAL HYSTERECTOMY    . BRAIN MENINGIOMA EXCISION  2005    . BREAST BIOPSY    . BREAST LUMPECTOMY Right    2017  . BREAST LUMPECTOMY WITH RADIOACTIVE SEED LOCALIZATION Right 04/20/2016   Procedure: RIGHT BREAST LUMPECTOMY WITH RADIOACTIVE SEED LOCALIZATION;  Surgeon: Fanny Skates, MD;  Location: [facility];  Service: General;  Laterality: Right;  . BREAST REDUCTION SURGERY Bilateral 10/23/2016   Procedure: BILATERAL BREAST REDUCTION WITH LIPOSUCTION ASSISTANCE;  Surgeon: Cristine Polio, MD;  Location: [facility];  Service: Plastics;  Laterality: Bilateral;  . CESAREAN SECTION    . FOOT SURGERY Bilateral 2016   hammer toe surgery  . MENISCUS REPAIR Left 2015  . PITUITARY SURGERY    . REDUCTION MAMMAPLASTY    . THYROID SURGERY       Medications Prior to Admission: Prior to Admission medications   Medication Sig Start Date End Date Taking? Authorizing Provider  Biotin 10000 MCG TABS Take by mouth.    [provider]  Cholecalciferol (VITAMIN D-3 PO) Take by mouth.    [provider]  Coenzyme Q-10 100 MG capsule Take 100 mg by mouth daily.    [provider]  Continuous Blood Gluc Sensor ([place]) MISC Apply to upper arm and change sensor every 10 days 03/05/17   Elayne Snare, MD  diltiazem (CARDIZEM CD) 180 MG 24 hr capsule Take 180 mg by mouth daily.    [provider]  doxycycline (VIBRAMYCIN) 100 MG capsule  Take 1 capsule (100 mg total) by mouth 2 (two) times daily. 12/31/16   Jaynee Eagles, PA-C  DULERA 100-5 MCG/ACT AERO Inhale 2 puffs into the lungs 2 (two) times daily as needed for wheezing or shortness of breath (allergies).  09/03/13   [provider]  FREESTYLE TEST STRIPS test strip USE THREE TIMES DAILY TO CHECK BLOOD SUGAR 01/08/17   Elayne Snare, MD  hydrochlorothiazide (HYDRODIURIL) 25 MG tablet Take 25 mg by mouth daily.    [provider]  insulin aspart (NOVOLOG) 100 UNIT/ML injection Inject about 50 units per day in insulin  pump Patient taking differently: Inject about 80 units per day in insulin pump 06/08/17   Elayne Snare, MD  Insulin Lispro (HUMALOG KWIKPEN) 200 UNIT/ML SOPN Inject 50 Units into the skin daily. Use in insulin pump. 06/13/17   Elayne Snare, MD  liraglutide (VICTOZA) 18 MG/3ML SOPN ADMINISTER 1.8 MG UNDER THE SKIN DAILY 06/13/17   Elayne Snare, MD  MULTIPLE VITAMIN PO Take 1 tablet daily by mouth.    [provider]  rosuvastatin (CRESTOR) 5 MG tablet Take 1 tablet (5 mg total) by mouth daily. 06/13/17   Elayne Snare, MD  venlafaxine (EFFEXOR-XR) 150 MG 24 hr capsule Take 75 mg by mouth daily.     [provider]     Allergies:    Allergies  Allergen Reactions  . Hydrocodone     Severe anxiety  . Invokana [Canagliflozin] Other (See Comments)    Constant yeast infections    Social History:   Social History   Socioeconomic History  . Marital status: Single    Spouse name: Not on file  . Number of children: Not on file  . Years of education: Not on file  . Highest education level: Not on file  Social Needs  . Financial resource strain: Not on file  . Food insecurity - worry: Not on file  . Food insecurity - inability: Not on file  . Transportation needs - medical: Not on file  . Transportation needs - non-medical: Not on file  Occupational History  . Not on file  Tobacco Use  . Smoking status: Never Smoker  . Smokeless tobacco: Never Used  Substance and Sexual Activity  . Alcohol use: Yes    Alcohol/week: 0.6 oz    Types: 1 Glasses of wine per week    Comment: 1 glass monthly  . Drug use: No  . Sexual activity: Not on file    Comment: no cycles; pt had hyst  Other Topics Concern  . Not on file  Social History Narrative  . Not on file    Family History:   The patient's family history includes Diabetes in her mother; Hyperlipidemia in her mother; Hypertension in her mother.    ROS:  Please see the history of present illness.  Unable to obtain because of the  patient's critical illness  Physical Exam/Data:   Vitals:   06/25/17 0539 06/25/17 0847 06/25/17 0852 06/25/17 0857  BP: 106/63 111/64 110/66 115/67  Pulse: 95 91 91 93  Resp: 16 15 13  (!) 23  Temp:      TempSrc:      SpO2: 98% 99% 100% 99%    blood pressure is 80/55, heart rate initially is 40 bpm but on arrival to the Cath Lab heart rate is in the 80s, respiratory rate is 16.   General: Acutely ill-appearing, lethargic, transcutaneously pacing HEENT: normal Lymph: no adenopathy Neck: no JVD Endocrine:  No thryomegaly  Vascular: No carotid bruits; FA pulses faint Cardiac:  normal S1, S2; RRR; no murmur distant heart sounds Lungs:  clear to auscultation bilaterally, no wheezing, rhonchi or rales  Abd: soft, nontender, no hepatomegaly  Ext: no edema Musculoskeletal:  No deformities, BUE and BLE strength normal and equal Skin: warm and dry  Neuro:  CNs 2-12 intact, no focal abnormalities noted   EKG:  The ECG that was done was personally reviewed and demonstrates complete heart block, inferolateral STEMI pattern  Laboratory Data:  ChemistryNo results for input(s): NA, K, CL, CO2, GLUCOSE, BUN, CREATININE, CALCIUM, GFRNONAA, GFRAA, ANIONGAP in the last 168 hours.  No results for input(s): PROT, ALBUMIN, AST, ALT, ALKPHOS, BILITOT in the last 168 hours. Hematology Recent Labs  Lab 06/25/17 0803  WBC 7.7  RBC 3.93  HGB 11.2*  HCT 35.1*  MCV 89.3  MCH 28.5  MCHC 31.9  RDW 15.0  PLT 314   Cardiac EnzymesNo results for input(s): TROPONINI in the last 168 hours. No results for input(s): TROPIPOC in the last 168 hours.  BNPNo results for input(s): BNP, PROBNP in the last 168 hours.  DDimer No results for input(s): DDIMER in the last 168 hours.  Radiology/Studies:  No results found.  Assessment and Plan:   1. Acute inferolateral STEMI 2. Complete heart block 3. Cardiogenic shock 4. Type 2 diabetes, insulin requiring 5. Hypertension 6. Cerebrovascular disease with  history of TIA 7. Hyperlipidemia  The patient is hemodynamically unstable and complete heart block being transcutaneously paced, also in cardiogenic shock with a systolic blood pressure of approximately 60 mmHg.  She is being taken emergently to the cardiac catheterization lab for emergency cath, PCI, temporary transvenous pacemaker placement, and hemodynamic support if needed.  Emergency implied consent is obtained.  Further plans pending her clinical course.  Plan is discussed briefly with her family who is at the bedside (brother and daughter).  Severity of Illness: The appropriate patient status for this patient is INPATIENT. Inpatient status is judged to be reasonable and necessary in order to provide the required intensity of service to ensure the patient's safety. The patient's presenting symptoms, physical exam findings, and initial radiographic and laboratory data in the context of their chronic comorbidities is felt to place them at high risk for further clinical deterioration. Furthermore, it is not anticipated that the patient will be medically stable for discharge from the hospital within 2 midnights of admission. The following factors support the patient status of inpatient.   " The patient's presenting symptoms include chest pain. " The worrisome physical exam findings include bradycardia. " The initial radiographic and laboratory data are worrisome because of complete heart block on EKG. " The chronic co-morbidities include insulin requiring diabetes.   * I certify that at the point of admission it is my clinical judgment that the patient will require inpatient hospital care spanning beyond 2 midnights from the point of admission due to high intensity of service, high risk for further deterioration and high frequency of surveillance required.*    For questions or updates, please contact [place] Please consult [email or web address] for contact info under Cardiology/STEMI.     Signed, Sherren Mocha, MD  06/25/2017 9:28 AM

## 2017-06-25 NOTE — ED Notes (Signed)
Patient c/o chest pain onset 2 am states the pain woke her up. Patient was extremely diaphoretic with a heart rate of 43 , patient thought she was having an anxiety attack. Patient was placed on pacer pads . Dr. Rogene Houston at bedside. Code stemi called . Dr Burt Knack in ED and came into the room. Patient family here.  0740 am Patient taken emergently to cath lab.

## 2017-06-25 NOTE — Progress Notes (Signed)
   06/25/17 0800  Clinical Encounter Type  Visited With Family  Visit Type Spiritual support  Referral From Nurse  Spiritual Encounters  Spiritual Needs Emotional   Visited with Destiny Beasley's husband and two daughters in the old cath lab waiting area. Offered emotional support.

## 2017-06-25 NOTE — Progress Notes (Signed)
Right Groin sheath discontinued.  Pressure held to groin then gauze pressure dressing applied to site.  Instructions given to patient regarding not moving leg and being on bedrest (requirements).  Right groin level "0".  Lattie Haw, RN assessed leg post sheath removal.

## 2017-06-25 NOTE — Progress Notes (Signed)
Inpatient Diabetes Program Recommendations  AACE/ADA: New Consensus Statement on Inpatient Glycemic Control (2015)  Target Ranges:  Prepandial:   less than 140 mg/dL      Peak postprandial:   less than 180 mg/dL (1-2 hours)      Critically ill patients:  140 - 180 mg/dL   Lab Results  Component Value Date   GLUCAP 227 (H) 06/25/2017   HGBA1C 7.5 (H) 06/25/2017    Review of Glycemic Control Results for Destiny Beasley, Destiny Beasley (MRN [id number]) as of 06/25/2017 14:53  Ref. Range 06/25/2017 07:09 06/25/2017 09:45 06/25/2017 13:12  Glucose-Capillary Latest Ref Range: 65 - 99 mg/dL 190 (H) 236 (H) 227 (H)   Diabetes history: DM2 Outpatient Diabetes medications: Insulin Pump Current orders for Inpatient glycemic control: Insulin pump  Inpatient Diabetes Program Recommendations:    Spoke with patient @ bedside. Patient placed her insulin pump,signed patient contract and has flowsheet @ bedside regarding her insulin administration by pump.  Per Dr. Ronnie Derby office visit on 11/23/16 Insulin regimen: Omnipod insulin pump: Basal rate 2.15 until 6 PM and then 2.3 Carbohydrate coverage 1:10 until 6 PM and then 1:9 and active insulin time 4 hours with target 100 Sensitivity 1:25  TOTAL insulin dose 65 units and the basal currently 50.5 units  Will follow during hospitalization.  Thank you, Nani Gasser. Phat Dalton, RN, MSN, CDE  Diabetes Coordinator Inpatient Glycemic Control Team Team Pager [phone] (8am-5pm) 06/25/2017 2:54 PM

## 2017-06-25 NOTE — ED Triage Notes (Signed)
Pt arrives via EMS from home with generalized abdominal pain, cramping radiating to her chest since waking this AM. Denies recent fever. Vomited in route to ED after being medicated with 324mg  ASA. Pt was very anxious. Has 22g L hand. Received 4mg  zofran. EKG NSR. VSS. CBG 216. Pain currently 7/10 per patient.

## 2017-06-25 NOTE — ED Provider Notes (Signed)
[facility] EMERGENCY DEPARTMENT Provider Note   CSN: [id number] Arrival date & time: 06/25/17  0709     History   Chief Complaint Chief Complaint  Patient presents with  . Chest Pain  . Abdominal Pain    HPI YEVA BISSETTE is a 62 y.o. female.  Patient brought in by EMS.  Patient with onset of chest pain radiating to the back at 2 in the morning got more severe at 6 in the morning.  Patient has a history of diabetes has an insulin pump.  No known cardiac history.  EMS did give her 324 mg of aspirin.  He felt chest pain was secondary to anxiety.  Patient with a history of TIA in the past.  Patient not on blood thinners.      Past Medical History:  Diagnosis Date  . Anxiety   . Diabetes mellitus   . Goiter   . History of radiation therapy 06/14/2016 - 07/19/2016   Right Breast 50 Gy 25 fractions  . Hypercholesteremia   . Hypertension   . Malignant neoplasm of upper-outer quadrant of right female breast ([place]) 02/24/2016  . Meningioma ([place])   . Personal history of chemotherapy   . Personal history of radiation therapy   . Pituitary tumor   . Sleep apnea    does not use every night  . Stroke [facility])    TIA - Dec 1 st, 2017    Patient Active Problem List   Diagnosis Date Noted  . TIA (transient ischemic attack) 03/24/2016  . Malignant neoplasm of upper-outer quadrant of right female breast ([place]) 02/24/2016  . Type II diabetes mellitus, uncontrolled ([place]) 02/11/2014  . Essential hypertension, benign 09/07/2013  . Chest pain 11/11/2011  . Anxiety 11/11/2011  . Diabetes mellitus ([place]) 11/11/2011  . Hyperlipidemia 11/11/2011    Past Surgical History:  Procedure Laterality Date  . ABDOMINAL HYSTERECTOMY    . BRAIN MENINGIOMA EXCISION  2005  . BREAST BIOPSY    . BREAST LUMPECTOMY Right    2017  . BREAST LUMPECTOMY WITH RADIOACTIVE SEED LOCALIZATION Right 04/20/2016   Procedure: RIGHT BREAST LUMPECTOMY WITH RADIOACTIVE SEED LOCALIZATION;  Surgeon:  Fanny Skates, MD;  Location: [facility];  Service: General;  Laterality: Right;  . BREAST REDUCTION SURGERY Bilateral 10/23/2016   Procedure: BILATERAL BREAST REDUCTION WITH LIPOSUCTION ASSISTANCE;  Surgeon: Cristine Polio, MD;  Location: [facility];  Service: Plastics;  Laterality: Bilateral;  . CESAREAN SECTION    . FOOT SURGERY Bilateral 2016   hammer toe surgery  . MENISCUS REPAIR Left 2015  . PITUITARY SURGERY    . REDUCTION MAMMAPLASTY    . THYROID SURGERY      OB History    Gravida Para Term Preterm AB Living   4         2   SAB TAB Ectopic Multiple Live Births                   Home Medications    Prior to Admission medications   Medication Sig Start Date End Date Taking? Authorizing Provider  Biotin 10000 MCG TABS Take by mouth.    [provider]  Cholecalciferol (VITAMIN D-3 PO) Take by mouth.    [provider]  Coenzyme Q-10 100 MG capsule Take 100 mg by mouth daily.    [provider]  Continuous Blood Gluc Sensor (FREESTYLE LIBRE SENSOR SYSTEM) MISC Apply to upper arm and change sensor every 10 days  03/05/17   Elayne Snare, MD  diltiazem (CARDIZEM CD) 180 MG 24 hr capsule Take 180 mg by mouth daily.    [provider]  doxycycline (VIBRAMYCIN) 100 MG capsule Take 1 capsule (100 mg total) by mouth 2 (two) times daily. 12/31/16   Jaynee Eagles, PA-C  DULERA 100-5 MCG/ACT AERO Inhale 2 puffs into the lungs 2 (two) times daily as needed for wheezing or shortness of breath (allergies).  09/03/13   [provider]  FREESTYLE TEST STRIPS test strip USE THREE TIMES DAILY TO CHECK BLOOD SUGAR 01/08/17   Elayne Snare, MD  hydrochlorothiazide (HYDRODIURIL) 25 MG tablet Take 25 mg by mouth daily.    [provider]  insulin aspart (NOVOLOG) 100 UNIT/ML injection Inject about 50 units per day in insulin pump Patient taking differently: Inject about 80 units per day in insulin pump 06/08/17   Elayne Snare, MD  Insulin Lispro (HUMALOG KWIKPEN) 200 UNIT/ML SOPN Inject 50 Units into the skin daily. Use in insulin pump. 06/13/17   Elayne Snare, MD  liraglutide (VICTOZA) 18 MG/3ML SOPN ADMINISTER 1.8 MG UNDER THE SKIN DAILY 06/13/17   Elayne Snare, MD  MULTIPLE VITAMIN PO Take 1 tablet daily by mouth.    [provider]  rosuvastatin (CRESTOR) 5 MG tablet Take 1 tablet (5 mg total) by mouth daily. 06/13/17   Elayne Snare, MD  venlafaxine (EFFEXOR-XR) 150 MG 24 hr capsule Take 75 mg by mouth daily.     [provider]    Family History Family History  Problem Relation Age of Onset  . Diabetes Mother   . Hyperlipidemia Mother   . Hypertension Mother     Social History Social History   Tobacco Use  . Smoking status: Never Smoker  . Smokeless tobacco: Never Used  Substance Use Topics  . Alcohol use: Yes    Alcohol/week: 0.6 oz    Types: 1 Glasses of wine per week    Comment: 1 glass monthly  . Drug use: No     Allergies   Hydrocodone and Invokana [canagliflozin]   Review of Systems Review of Systems  Constitutional: Positive for diaphoresis.  HENT: Negative for congestion.   Eyes: Negative for redness.  Respiratory: Positive for shortness of breath.   Cardiovascular: Positive for chest pain.  Gastrointestinal: Negative for abdominal pain.  Genitourinary: Negative for dysuria.  Musculoskeletal: Positive for back pain.  Skin: Negative for rash.  Neurological: Negative for headaches.  Hematological: Does not bruise/bleed easily.  Psychiatric/Behavioral: Negative for confusion.     Physical Exam Updated Vital Signs BP (!) 60/30 (BP Location: Left Arm)   Pulse (!) 40   Resp (!) 22   Physical Exam  Constitutional: She is oriented to person, place, and time. She appears well-developed and well-nourished. She appears distressed.  Patient diaphoretic  HENT:  Head: Normocephalic and atraumatic.  Mouth/Throat: Oropharynx is clear and moist.  Eyes:  Conjunctivae and EOM are normal. Pupils are equal, round, and reactive to light.  Neck: Neck supple.  Cardiovascular: Normal rate, regular rhythm and normal heart sounds.  Pulmonary/Chest: Effort normal and breath sounds normal. She exhibits no tenderness.  Abdominal: Soft. Bowel sounds are normal. There is no tenderness.  Musculoskeletal: Normal range of motion.  Neurological: She is alert and oriented to person, place, and time. No cranial nerve deficit or sensory deficit. She exhibits normal muscle tone. Coordination normal.  Skin: Skin is warm.  Nursing note and vitals reviewed.    ED Treatments / Results  Labs (all labs ordered are listed, but only abnormal results are displayed) Labs Reviewed  CBG MONITORING, ED - Abnormal; Notable for the following components:      Result Value   Glucose-Capillary 190 (*)    All other components within normal limits    EKG  EKG Interpretation None       Radiology No results found.  Procedures Procedures (including critical care time)  CRITICAL CARE Performed by: Nagi Furio Total critical care time: 30 minutes Critical care time was exclusive of separately billable procedures and treating other patients. Critical care was necessary to treat or prevent imminent or life-threatening deterioration. Critical care was time spent personally by me on the following activities: development of treatment plan with patient and/or surrogate as well as nursing, discussions with consultants, evaluation of patient's response to treatment, examination of patient, obtaining history from patient or surrogate, ordering and performing treatments and interventions, ordering and review of laboratory studies, ordering and review of radiographic studies, pulse oximetry and re-evaluation of patient's condition.   Medications Ordered in ED Medications - No data to display   Initial Impression / Assessment and Plan / ED Course  I have reviewed the  triage vital signs and the nursing notes.  Pertinent labs & imaging results that were available during my care of the patient were reviewed by me and considered in my medical decision making (see chart for details).      Patient with onset of chest pain radiating to the back at 2 in the morning got worse at 6 in the morning patient with a history of diabetes.  Has an insulin pump.  Brought in by EMS for chest pain and anxiety.  Patient was given 324 mg aspirin prior to arrival.  Patient states that chest is still hurting.  Denies any hit known history of cardiac events.  Has had a TIA before.  EKG consistent with complete heart block and an inferior MI.  Code STEMI activated by Dr. Burt Knack from cardiology was already in the department so we grabbed him evaluate the patient patient taken to Cath Lab.  Temporary pacing external pacing was initiated.  Patient was hypotensive with a heart rate naturally in the 40s.  Blood pressure was like 56 systolic.   Cath Lab was ready so patient was taken immediately up to Cath Lab.  Patient was receiving IV fluid boluses.   In addition patient's blood sugar here was around 169.  Final Clinical Impressions(s) / ED Diagnoses   Final diagnoses:  Complete heart block (HCC)  Acute ST elevation myocardial infarction (STEMI) of inferior wall [facility])    ED Discharge Orders    None       Fredia Sorrow, MD 06/25/17 [phone]

## 2017-06-26 ENCOUNTER — Inpatient Hospital Stay (HOSPITAL_COMMUNITY): Payer: 59

## 2017-06-26 ENCOUNTER — Other Ambulatory Visit: Payer: Self-pay

## 2017-06-26 DIAGNOSIS — I5021 Acute systolic (congestive) heart failure: Secondary | ICD-10-CM

## 2017-06-26 DIAGNOSIS — G4733 Obstructive sleep apnea (adult) (pediatric): Secondary | ICD-10-CM

## 2017-06-26 DIAGNOSIS — R570 Cardiogenic shock: Secondary | ICD-10-CM

## 2017-06-26 DIAGNOSIS — R079 Chest pain, unspecified: Secondary | ICD-10-CM

## 2017-06-26 LAB — BASIC METABOLIC PANEL
ANION GAP: 10 (ref 5–15)
BUN: 15 mg/dL (ref 6–20)
CO2: 27 mmol/L (ref 22–32)
CREATININE: 0.99 mg/dL (ref 0.44–1.00)
Calcium: 9 mg/dL (ref 8.9–10.3)
Chloride: 103 mmol/L (ref 101–111)
Glucose, Bld: 133 mg/dL — ABNORMAL HIGH (ref 65–99)
Potassium: 3.2 mmol/L — ABNORMAL LOW (ref 3.5–5.1)
SODIUM: 140 mmol/L (ref 135–145)

## 2017-06-26 LAB — GLUCOSE, CAPILLARY
GLUCOSE-CAPILLARY: 138 mg/dL — AB (ref 65–99)
Glucose-Capillary: 118 mg/dL — ABNORMAL HIGH (ref 65–99)
Glucose-Capillary: 128 mg/dL — ABNORMAL HIGH (ref 65–99)
Glucose-Capillary: 118 mg/dL — ABNORMAL HIGH (ref 65–99)
Glucose-Capillary: 99 mg/dL (ref 65–99)

## 2017-06-26 LAB — CBC
HCT: 34.3 % — ABNORMAL LOW (ref 36.0–46.0)
HEMOGLOBIN: 11.1 g/dL — AB (ref 12.0–15.0)
MCH: 28.3 pg (ref 26.0–34.0)
MCHC: 32.4 g/dL (ref 30.0–36.0)
MCV: 87.5 fL (ref 78.0–100.0)
PLATELETS: 287 10*3/uL (ref 150–400)
RBC: 3.92 MIL/uL (ref 3.87–5.11)
RDW: 15.2 % (ref 11.5–15.5)
WBC: 10.7 10*3/uL — ABNORMAL HIGH (ref 4.0–10.5)

## 2017-06-26 LAB — LIPID PANEL
CHOL/HDL RATIO: 3.1 ratio
Cholesterol: 113 mg/dL (ref 0–200)
HDL: 37 mg/dL — ABNORMAL LOW (ref >40)
LDL CALC: 54 mg/dL (ref 0–99)
Triglycerides: 109 mg/dL (ref <150)
VLDL: 22 mg/dL (ref 0–40)

## 2017-06-26 LAB — ECHOCARDIOGRAM COMPLETE: Height: 72 in

## 2017-06-26 MED ORDER — IRBESARTAN 75 MG PO TABS
75.0000 mg | ORAL_TABLET | Freq: Every day | ORAL | Status: DC
  Administered 2017-06-26 – 2017-06-28 (×3): 75 mg via ORAL
  Filled 2017-06-26 (×3): qty 1

## 2017-06-26 MED ORDER — CHLORHEXIDINE GLUCONATE 0.12 % MT SOLN
15.0000 mL | Freq: Two times a day (BID) | OROMUCOSAL | Status: DC
  Administered 2017-06-27 – 2017-06-29 (×4): 15 mL via OROMUCOSAL
  Filled 2017-06-26 (×4): qty 15

## 2017-06-26 MED ORDER — POTASSIUM CHLORIDE CRYS ER 20 MEQ PO TBCR: 40.0000 meq | EXTENDED_RELEASE_TABLET | Freq: Two times a day (BID) | ORAL | Status: DC

## 2017-06-26 MED ORDER — POTASSIUM CHLORIDE CRYS ER 20 MEQ PO TBCR
40.0000 meq | EXTENDED_RELEASE_TABLET | Freq: Once | ORAL | Status: AC
  Administered 2017-06-26: 40 meq via ORAL
  Filled 2017-06-26: qty 2

## 2017-06-26 MED ORDER — ORAL CARE MOUTH RINSE
15.0000 mL | Freq: Two times a day (BID) | OROMUCOSAL | Status: DC
  Administered 2017-06-28: 15 mL via OROMUCOSAL

## 2017-06-26 MED ORDER — METOPROLOL TARTRATE 25 MG PO TABS
25.0000 mg | ORAL_TABLET | Freq: Two times a day (BID) | ORAL | Status: DC
  Administered 2017-06-26 – 2017-06-28 (×5): 25 mg via ORAL
  Filled 2017-06-26 (×5): qty 1

## 2017-06-26 NOTE — Plan of Care (Signed)
  Progressing Education: Understanding of cardiac disease, CV risk reduction, and recovery process will improve 06/26/2017 0802 - Progressing by Jenne Campus, RN Understanding of medication regimen will improve 06/26/2017 0802 - Progressing by Jenne Campus, RN Activity: Ability to tolerate increased activity will improve 06/26/2017 0802 - Progressing by Jenne Campus, RN Cardiac: Ability to achieve and maintain adequate cardiopulmonary perfusion will improve 06/26/2017 0802 - Progressing by Jenne Campus, RN Vascular access site(s) Level 0-1 will be maintained 06/26/2017 0802 - Progressing by Jenne Campus, RN Health Behavior/Discharge Planning: Ability to safely manage health-related needs after discharge will improve 06/26/2017 0802 - Progressing by Jenne Campus, RN

## 2017-06-26 NOTE — Progress Notes (Signed)
Progress Note  Patient Name: Destiny Beasley Date of Encounter: 06/26/2017  Primary Cardiologist: new; Dr. Burt Knack  Subjective   Day 1 s/p LCX STEMI; no recurrent chest pain  Inpatient Medications    Scheduled Meds: . aspirin EC  81 mg Oral Daily  . atorvastatin  80 mg Oral q1800  . heparin  5,000 Units Subcutaneous Q8H  . insulin pump   Subcutaneous TID AC, HS, 0200  . liraglutide  1.8 mg Subcutaneous Daily  . metoprolol tartrate  12.5 mg Oral BID  . mometasone-formoterol  2 puff Inhalation BID  . sodium chloride flush  3 mL Intravenous Q12H  . ticagrelor  90 mg Oral BID  . venlafaxine XR  75 mg Oral Daily   Continuous Infusions: . sodium chloride    . DOPamine Stopped (06/25/17 1415)   PRN Meds: sodium chloride, acetaminophen, alum & mag hydroxide-simeth, diazepam, magnesium hydroxide, nitroGLYCERIN, ondansetron (ZOFRAN) IV, sodium chloride flush   Vital Signs    Vitals:   06/26/17 0742 06/26/17 0800 06/26/17 0805 06/26/17 0829  BP:  117/72    Pulse:      Resp:  15    Temp: 98.9 F (37.2 C)     TempSrc: Oral     SpO2:    96%  Height:   6' (1.829 m)     Intake/Output Summary (Last 24 hours) at 06/26/2017 0840 Last data filed at 06/25/2017 2000 Gross per 24 hour  Intake 1109.15 ml  Output 2375 ml  Net -1265.85 ml    I/O since admission:  -1265  There were no vitals filed for this visit.  Telemetry    NSR in the 90s - Personally Reviewed  ECG    ECG (independently read by me): NSR at 94;' small Q inferiorly and T wave inversion 2,3,aVF, V5-6.   Physical Exam   BP 117/72   Pulse 91   Temp 98.9 F (37.2 C) (Oral)   Resp 15   Ht 6' (1.829 m)   SpO2 96%   BMI 32.96 kg/m  General: Alert, oriented, no distress.  Skin: normal turgor, no rashes, warm and dry HEENT: Normocephalic, atraumatic. Pupils equal round and reactive to light; sclera anicteric; extraocular muscles intact;  Nose without nasal septal hypertrophy Mouth/Parynx benign; Mallinpatti  scale 3 Neck: No JVD, no carotid bruits; normal carotid upstroke Lungs: clear to ausculatation and percussion; no wheezing or rales Chest wall: without tenderness to palpitation Heart: PMI not displaced, RRR, s1 s2 normal, 1/6 systolic murmur, no diastolic murmur, no rubs, gallops, thrills, or heaves Abdomen: soft, nontender; no hepatosplenomehaly, BS+; abdominal aorta nontender and not dilated by palpation. Back: no CVA tenderness Pulses 2+; s/p perclose R groin cath sirte Musculoskeletal: full range of motion, normal strength, no joint deformities Extremities: no clubbing cyanosis or edema, Homan's sign negative  Neurologic: grossly nonfocal; Cranial nerves grossly wnl Psychologic: Normal mood and affect   Labs    Chemistry Recent Labs  Lab 06/25/17 0803 06/26/17 0258  NA 137 140  K 3.9 3.2*  CL 104 103  CO2 21* 27  GLUCOSE 276* 133*  BUN 13 15  CREATININE 1.07* 0.99  CALCIUM 8.8* 9.0  PROT 6.2*  --   ALBUMIN 3.3*  --   AST 22  --   ALT 16  --   ALKPHOS 74  --   BILITOT 0.5  --   GFRNONAA 56* >60  GFRAA >60 >60  ANIONGAP 12 10     Hematology Recent Labs  Lab  06/25/17 0803 06/26/17 0258  WBC 7.7 10.7*  RBC 3.93 3.92  HGB 11.2* 11.1*  HCT 35.1* 34.3*  MCV 89.3 87.5  MCH 28.5 28.3  MCHC 31.9 32.4  RDW 15.0 15.2  PLT 314 287    Cardiac Enzymes Recent Labs  Lab 06/25/17 0803 06/25/17 1251 06/25/17 2206  TROPONINI 0.27* 22.13* >65.00*   No results for input(s): TROPIPOC in the last 168 hours.   BNP Recent Labs  Lab 06/25/17 1251  BNP 58.0     DDimer No results for input(s): DDIMER in the last 168 hours.   Lipid Panel     Component Value Date/Time   CHOL 113 06/26/2017 0258   TRIG 109 06/26/2017 0258   HDL 37 (L) 06/26/2017 0258   CHOLHDL 3.1 06/26/2017 0258   VLDL 22 06/26/2017 0258   LDLCALC 54 06/26/2017 0258    Radiology    No results found.  Cardiac Studies   06/25/2017 Conclusion     There is mild to moderate left  ventricular systolic dysfunction.  LV end diastolic pressure is moderately elevated.  The left ventricular ejection fraction is 45-50% by visual estimate.  Prox LAD to Mid LAD lesion is 30% stenosed.  Prox Cx lesion is 100% stenosed.  A drug-eluting stent was successfully placed using a STENT SYNERGY DES 4X20.  Post intervention, there is a 0% residual stenosis.   1.  Acute inferolateral STEMI involving a large, dominant left circumflex complicated by cardiogenic shock and complete heart block 2.  Successful PCI of the left circumflex using aspiration thrombectomy and drug-eluting stent implantation 3.  Mild nonobstructive disease in the left main, LAD, and nondominant RCA without significant stenosis in any of those vessels 4.  Mild segmental contraction abnormality of the left ventricle consistent with a left circumflex/inferior infarct, with mild segmental LV systolic dysfunction, LVEF estimated at 50% 5.  Acute systolic heart failure with elevated LVEDP and mildly elevated right heart pressures  Recommendations: The patient will be transferred to the cardiac ICU for continued post MI care.  We will continue Cangrelor for 2 hours.  She is loaded with Brilinta 180 mg at the completion of the procedure.  Post MI medical therapy will be instituted.  We will wean her off of dopamine as tolerated.      Patient Profile     62 y.o. female who presented 3/3 am with acute inferolateral STEMi 2/2/ total occlusion of large LCx complicated by shock and CHB.  Assessment & Plan    1. Day 1 s/p LCX STEMI complicated by cardiogenic shock and CHB;  S/p PCI with thrombectomy at DES stent to LCX.   Trop >65.  2. HTN: previously on irbesartan 150 mg, diltiazem 180 mg, HTCZ.  Will resume irbesartan 75 mg initially and titrate.  Will increase metoprolol 25 mg bid today with HR in the 90s.  3. OSA: Pt awakened from sleep  In early morning with chest pain. Upon questioning she was told in past of  having sleep apnea, but was not using CPAP.  Will need to re-use.  Will add CPAP Auto in hospital.  4. Type 2 DM; insulin dependent.   5. HLD;  Now on atorvastatin 80 mg;  LDL now 54, was 159.  6. Obesity; needs weight loss and exercise  7. HypoKalemia: 3.2  repleting   Signed, Troy Sine, MD, [facility] 06/26/2017, 8:40 AM

## 2017-06-26 NOTE — Progress Notes (Signed)
  Echocardiogram 2D Echocardiogram has been performed.  Destiny Beasley M 06/26/2017, 11:25 AM

## 2017-06-26 NOTE — Progress Notes (Signed)
[id number] Assessed pt's chart for pertinent data for Cardiac Rehab for ed. Pt's daughter and husband in room and asked that pt be left to sleep as she just dozed off. I gave daughter the stent card , MI booklet and heart healthy diet to start looking over. Discussed with her and pt's husband that CR would return tomorrow to start education and ambulate and our purpose for pt progress.Graylon Good RN BSN 06/26/2017 2:41 PM

## 2017-06-27 DIAGNOSIS — E785 Hyperlipidemia, unspecified: Secondary | ICD-10-CM

## 2017-06-27 LAB — BASIC METABOLIC PANEL
Anion gap: 9 (ref 5–15)
BUN: 17 mg/dL (ref 6–20)
CALCIUM: 8.5 mg/dL — AB (ref 8.9–10.3)
CO2: 25 mmol/L (ref 22–32)
CREATININE: 0.96 mg/dL (ref 0.44–1.00)
Chloride: 104 mmol/L (ref 101–111)
GFR calc non Af Amer: >60 mL/min (ref >60)
Glucose, Bld: 138 mg/dL — ABNORMAL HIGH (ref 65–99)
Potassium: 3.7 mmol/L (ref 3.5–5.1)
SODIUM: 138 mmol/L (ref 135–145)

## 2017-06-27 LAB — GLUCOSE, CAPILLARY
GLUCOSE-CAPILLARY: 153 mg/dL — AB (ref 65–99)
GLUCOSE-CAPILLARY: 159 mg/dL — AB (ref 65–99)
Glucose-Capillary: 88 mg/dL (ref 65–99)
Glucose-Capillary: 91 mg/dL (ref 65–99)
Glucose-Capillary: 123 mg/dL — ABNORMAL HIGH (ref 65–99)

## 2017-06-27 LAB — HIV ANTIBODY (ROUTINE TESTING W REFLEX): HIV SCREEN 4TH GENERATION: NONREACTIVE

## 2017-06-27 NOTE — Progress Notes (Signed)
Progress Note  Patient Name: Destiny Beasley Date of Encounter: 06/27/2017  Primary Cardiologist: new; Dr. Burt Knack  Subjective   Day 2 s/p LCX STEMI; no recurrent chest pain; but complains of mild dyspnea better with supplemental oxygen  Inpatient Medications    Scheduled Meds: . aspirin EC  81 mg Oral Daily  . atorvastatin  80 mg Oral q1800  . chlorhexidine  15 mL Mouth Rinse BID  . heparin  5,000 Units Subcutaneous Q8H  . insulin pump   Subcutaneous TID AC, HS, 0200  . irbesartan  75 mg Oral Daily  . liraglutide  1.8 mg Subcutaneous Daily  . mouth rinse  15 mL Mouth Rinse q12n4p  . metoprolol tartrate  25 mg Oral BID  . mometasone-formoterol  2 puff Inhalation BID  . sodium chloride flush  3 mL Intravenous Q12H  . ticagrelor  90 mg Oral BID  . venlafaxine XR  75 mg Oral Daily   Continuous Infusions: . sodium chloride    . DOPamine Stopped (06/25/17 1415)   PRN Meds: sodium chloride, acetaminophen, alum & mag hydroxide-simeth, diazepam, magnesium hydroxide, nitroGLYCERIN, ondansetron (ZOFRAN) IV, sodium chloride flush   Vital Signs    Vitals:   06/27/17 0700 06/27/17 0738 06/27/17 0800 06/27/17 0840  BP: (!) 87/45  (!) 97/55   Pulse: 89  86   Resp: 20  14   Temp:  98.6 F (37 C)    TempSrc:  Oral    SpO2: 98%  99% 99%  Weight:  243 lb (110.2 kg)    Height:  6' (1.829 m)      Intake/Output Summary (Last 24 hours) at 06/27/2017 0944 Last data filed at 06/27/2017 0000 Gross per 24 hour  Intake 720 ml  Output -  Net 720 ml    I/O since admission:  -[place]   06/27/17 0738  Weight: 243 lb (110.2 kg)    Telemetry    NSR  Now ~ 85 - Personally Reviewed  ECG    ECG (independently read by me): NSR at 93; T inversion inferiorly , small Q waves but with preservation of inferior R waves  06/26/2017 ECG (independently read by me): NSR at 94;' small Q inferiorly and T wave inversion 2,3,aVF, V5-6.   Physical Exam   BP (!) 97/55   Pulse 86   Temp  98.6 F (37 C) (Oral)   Resp 14   Ht 6' (1.829 m)   Wt 243 lb (110.2 kg)   SpO2 99%   BMI 32.96 kg/m  General: Alert, oriented, no distress.  Skin: normal turgor, no rashes, warm and dry HEENT: Normocephalic, atraumatic. Pupils equal round and reactive to light; sclera anicteric; extraocular muscles intact;  Nose without nasal septal hypertrophy Mouth/Parynx benign; Mallinpatti scale 3 Neck: No JVD, no carotid bruits; normal carotid upstroke Lungs: clear to ausculatation and percussion; no wheezing or rales Chest wall: without tenderness to palpitation Heart: PMI not displaced, RRR, s1 s2 normal, 1/6 systolic murmur, no diastolic murmur, no rubs, gallops, thrills, or heaves Abdomen: soft, nontender; no hepatosplenomehaly, BS+; abdominal aorta nontender and not dilated by palpation. Back: no CVA tenderness Pulses 2+ R groin cath site stable Musculoskeletal: full range of motion, normal strength, no joint deformities Extremities: no clubbing cyanosis or edema, Homan's sign negative  Neurologic: grossly nonfocal; Cranial nerves grossly wnl Psychologic: Normal mood and affect   Labs    Chemistry Recent Labs  Lab 06/25/17 0803 06/26/17 0258 06/27/17 0352  NA 137  140 138  K 3.9 3.2* 3.7  CL 104 103 104  CO2 21* 27 25  GLUCOSE 276* 133* 138*  BUN 13 15 17   CREATININE 1.07* 0.99 0.96  CALCIUM 8.8* 9.0 8.5*  PROT 6.2*  --   --   ALBUMIN 3.3*  --   --   AST 22  --   --   ALT 16  --   --   ALKPHOS 74  --   --   BILITOT 0.5  --   --   GFRNONAA 56* >60 >60  GFRAA >60 >60 >60  ANIONGAP 12 10 9      Hematology Recent Labs  Lab 06/25/17 0803 06/26/17 0258  WBC 7.7 10.7*  RBC 3.93 3.92  HGB 11.2* 11.1*  HCT 35.1* 34.3*  MCV 89.3 87.5  MCH 28.5 28.3  MCHC 31.9 32.4  RDW 15.0 15.2  PLT 314 287    Cardiac Enzymes Recent Labs  Lab 06/25/17 0803 06/25/17 1251 06/25/17 2206  TROPONINI 0.27* 22.13* >65.00*   No results for input(s): TROPIPOC in the last 168 hours.     BNP Recent Labs  Lab 06/25/17 1251  BNP 58.0     DDimer No results for input(s): DDIMER in the last 168 hours.   Lipid Panel     Component Value Date/Time   CHOL 113 06/26/2017 0258   TRIG 109 06/26/2017 0258   HDL 37 (L) 06/26/2017 0258   CHOLHDL 3.1 06/26/2017 0258   VLDL 22 06/26/2017 0258   LDLCALC 54 06/26/2017 0258    Radiology    No results found.  Cardiac Studies   06/25/2017 Conclusion     There is mild to moderate left ventricular systolic dysfunction.  LV end diastolic pressure is moderately elevated.  The left ventricular ejection fraction is 45-50% by visual estimate.  Prox LAD to Mid LAD lesion is 30% stenosed.  Prox Cx lesion is 100% stenosed.  A drug-eluting stent was successfully placed using a STENT SYNERGY DES 4X20.  Post intervention, there is a 0% residual stenosis.   1.  Acute inferolateral STEMI involving a large, dominant left circumflex complicated by cardiogenic shock and complete heart block 2.  Successful PCI of the left circumflex using aspiration thrombectomy and drug-eluting stent implantation 3.  Mild nonobstructive disease in the left main, LAD, and nondominant RCA without significant stenosis in any of those vessels 4.  Mild segmental contraction abnormality of the left ventricle consistent with a left circumflex/inferior infarct, with mild segmental LV systolic dysfunction, LVEF estimated at 50% 5.  Acute systolic heart failure with elevated LVEDP and mildly elevated right heart pressures  Recommendations: The patient will be transferred to the cardiac ICU for continued post MI care.  We will continue Cangrelor for 2 hours.  She is loaded with Brilinta 180 mg at the completion of the procedure.  Post MI medical therapy will be instituted.  We will wean her off of dopamine as tolerated.      Patient Profile     62 y.o. female who presented 3/3 am with acute inferolateral STEMi 2/2/ total occlusion of large LCx complicated  by shock and CHB.  Assessment & Plan    1. Day 2 s/p LCX STEMI complicated by cardiogenic shock and CHB;  S/p PCI with thrombectomy at DES stent to LCX.   Trop >65.  No recurrent chest pain.   2. HTN: previously on irbesartan 150 mg, diltiazem 180 mg, HTCZ.  Resumed irbesartan 75 mg yesterday and increased metoprolol  25 mg bid. BP 95 - 100 today; will monitor and at present keep on present dose.  3. OSA: Pt awakened from sleep  In early morning with chest pain. Upon questioning she was told in past of having sleep apnea, but was not using CPAP.  I initiated CPAP Auto last night.  Slept well with nasal mask on Auto therapy.  4. Type 2 DM; insulin dependent.   5. HLD;  Now on atorvastatin 80 mg;  LDL now 54, was 159.  6. Obesity; needs weight loss and exercise  7. HypoKalemia: 3.2  >> 3.7 today  Will transfer out of CCU today to telemetry.  Cardiac Rehab to see today.   Signed, Troy Sine, MD, [facility] 06/27/2017, 9:44 AM

## 2017-06-27 NOTE — Progress Notes (Signed)
CARDIAC REHAB PHASE I   PRE:  Rate/Rhythm: 80 SR  BP:  Supine: 113/69  Sitting:   Standing:    SaO2: 97%RA  MODE:  Ambulation: 350 ft   POST:  Rate/Rhythm: 107 ST  BP:  Supine:   Sitting: 111/91  Standing:    SaO2: 98%RA 1330-1437 Pt walked 350 ft on RA with slow steady gait. Some DOE noted. Encourage slow deep breaths and rest stops. Pt stopped several times to rest. No CP. MI education completed with pt and brother who voiced understanding. Discussed importance of brilinta with stent. Needs to see case manager. Reviewed NTG use, risk factors, carb counting and heart healthy food choices, ex ed , MI restrictions and CRP 2. Also encouraged pt to weigh daily and let MD know if she gains 3 los overnight or 5 in week. Referring to [place]. Encouraged her to work again later in afternoon with assistance.   Graylon Good, RN BSN  06/27/2017 2:32 PM

## 2017-06-28 ENCOUNTER — Encounter (HOSPITAL_COMMUNITY): Payer: Self-pay

## 2017-06-28 LAB — BASIC METABOLIC PANEL
Anion gap: 9 (ref 5–15)
BUN: 16 mg/dL (ref 6–20)
CALCIUM: 8.6 mg/dL — AB (ref 8.9–10.3)
CO2: 26 mmol/L (ref 22–32)
Chloride: 104 mmol/L (ref 101–111)
Creatinine, Ser: 1.1 mg/dL — ABNORMAL HIGH (ref 0.44–1.00)
GFR calc Af Amer: >60 mL/min (ref >60)
GFR, EST NON AFRICAN AMERICAN: 54 mL/min — AB (ref >60)
GLUCOSE: 192 mg/dL — AB (ref 65–99)
Potassium: 3.6 mmol/L (ref 3.5–5.1)
SODIUM: 139 mmol/L (ref 135–145)

## 2017-06-28 LAB — CBC
HCT: 34.6 % — ABNORMAL LOW (ref 36.0–46.0)
Hemoglobin: 10.9 g/dL — ABNORMAL LOW (ref 12.0–15.0)
MCH: 28.3 pg (ref 26.0–34.0)
MCHC: 31.5 g/dL (ref 30.0–36.0)
MCV: 89.9 fL (ref 78.0–100.0)
PLATELETS: 295 10*3/uL (ref 150–400)
RBC: 3.85 MIL/uL — AB (ref 3.87–5.11)
RDW: 15.4 % (ref 11.5–15.5)
WBC: 7.6 10*3/uL (ref 4.0–10.5)

## 2017-06-28 LAB — GLUCOSE, CAPILLARY
GLUCOSE-CAPILLARY: 103 mg/dL — AB (ref 65–99)
GLUCOSE-CAPILLARY: 175 mg/dL — AB (ref 65–99)
GLUCOSE-CAPILLARY: 139 mg/dL — AB (ref 65–99)
GLUCOSE-CAPILLARY: 150 mg/dL — AB (ref 65–99)
Glucose-Capillary: 79 mg/dL (ref 65–99)

## 2017-06-28 MED ORDER — METOPROLOL TARTRATE 25 MG PO TABS
37.5000 mg | ORAL_TABLET | Freq: Two times a day (BID) | ORAL | Status: DC
  Administered 2017-06-28 – 2017-06-29 (×2): 37.5 mg via ORAL
  Filled 2017-06-28 (×2): qty 1

## 2017-06-28 NOTE — Progress Notes (Signed)
Labs reviewed. Hgb not significantly changed. Dr. Claiborne Billings believes this is likely msk pain to right groin; he recommended to titrate metoprolol and f/u in AM - if feeling better, probable DC. Note Cr 1.10 but her Cr appears to vary chronically from 0.9-1.1, and was 1.07 on admission.  Will tentatively hold AM dose of irbesartan so that cardiology team can review f/u Cr prior to administering. If Cr is stable, anticipate continuing.  Dayna Dunn PA-C

## 2017-06-28 NOTE — Care Management Note (Addendum)
Case Management Note  Patient Details  Name: LAYSA KIMMEY MRN: [id number] Date of Birth: [birth year]  Subjective/Objective:  Pt presented for [facility]- plan for home on Brilinta. Benefits Check Completed. CM will make pt aware of cost and provide pt with [employer]. Pt uses [employer]. No further needs from CM at this time.                  Action/Plan: S/W SHARON @ [employer] RX # [id number]    BRILINTA 90 MG BID  COVER- YES  CO-PAY- $ 35.00  TIER- 2 DRUG  PRIOR APPROVAL- NO   PREFERRED PHARMACY : [facility]   Expected Discharge Date:  06/28/17               Expected Discharge Plan:  Home/Self Care  In-House Referral:  NA  Discharge planning Services  CM Consult, Medication Assistance  Post Acute Care Choice:  NA Choice offered to:  NA  DME Arranged:  N/A DME Agency:  NA  HH Arranged:  NA HH Agency:  NA  Status of Service:  Completed, signed off  If discussed at [place] of Stay Meetings, dates discussed:    Additional Comments:  Bethena Roys, RN 06/28/2017, 12:26 PM

## 2017-06-28 NOTE — Progress Notes (Signed)
CARDIAC REHAB PHASE I   PRE:  Rate/Rhythm: 97 SR  BP:  Supine: 118/71      SaO2: 97% RA  MODE:  Ambulation: 350 ft   POST:  Rate/Rhythm: 107 ST  BP:  Sitting: 141/91       SaO2: 97%  Pt ambulated 350 ft. With slow steady gait on RA. Pt needed two rest stops. Pt reported mild SOB and mild pain from goin site. Pt was encouraged to take her time, focus on deep breathing and take rest breaks as needed. Pt did not have any complaints of CP or dizziness. Pt returned to bed per pt request. Pt encouraged to walk later on this afternoon with daughter. Call bell within reach.   [phone]   Carma Lair MS, ACSM CEP  9:19 AM 06/28/2017

## 2017-06-28 NOTE — Progress Notes (Addendum)
Progress Note  Patient Name: Destiny Beasley Date of Encounter: 06/28/2017  Primary Cardiologist: new to Dr. Burt Knack  Subjective   Feeling good from cardiac standpoint except now has new sharp RLQ pain which began after walking this AM. It happens at random, not tender to palpation. Prior scar noted from tummy tuck site from 01/2017.  Inpatient Medications    Scheduled Meds: . aspirin EC  81 mg Oral Daily  . atorvastatin  80 mg Oral q1800  . chlorhexidine  15 mL Mouth Rinse BID  . heparin  5,000 Units Subcutaneous Q8H  . insulin pump   Subcutaneous TID AC, HS, 0200  . irbesartan  75 mg Oral Daily  . liraglutide  1.8 mg Subcutaneous Daily  . mouth rinse  15 mL Mouth Rinse q12n4p  . metoprolol tartrate  25 mg Oral BID  . mometasone-formoterol  2 puff Inhalation BID  . sodium chloride flush  3 mL Intravenous Q12H  . ticagrelor  90 mg Oral BID  . venlafaxine XR  75 mg Oral Daily   Continuous Infusions: . sodium chloride    . DOPamine Stopped (06/25/17 1415)   PRN Meds: sodium chloride, acetaminophen, alum & mag hydroxide-simeth, diazepam, magnesium hydroxide, nitroGLYCERIN, ondansetron (ZOFRAN) IV, sodium chloride flush   Vital Signs    Vitals:   06/27/17 2301 06/28/17 0521 06/28/17 0858 06/28/17 1118  BP:  109/77 118/71   Pulse: 85 82 97   Resp: 16 15    Temp:  98.4 F (36.9 C)    TempSrc:  Oral    SpO2: 100% 100%  100%  Weight:  243 lb 3.2 oz (110.3 kg)    Height:        Intake/Output Summary (Last 24 hours) at 06/28/2017 1204 Last data filed at 06/28/2017 0500 Gross per 24 hour  Intake 780 ml  Output -  Net 780 ml   Filed Weights   06/27/17 0738 06/28/17 0521  Weight: 243 lb (110.2 kg) 243 lb 3.2 oz (110.3 kg)    Telemetry    NSR - Personally Reviewed  Physical Exam   GEN: No acute distress, obese AAF lying flat in bed HEENT: Normocephalic, atraumatic, sclera non-icteric. Neck: No JVD or bruits. Cardiac: RRR no murmurs, rubs, or gallops.    Radials/DP/PT 1+ and equal bilaterally.  Respiratory: Clear to auscultation bilaterally. Breathing is unlabored. GI: Soft, nontender, non-distended, BS +x 4. MS: no deformity. Prior tummy tuck scar noted across R lower abdomen Extremities: No clubbing or cyanosis. No edema. Distal pedal pulses are 2+ and equal bilaterally. Right groin cath site without hematoma, ecchymosis, or bruit. P Neuro:  AAOx3. Follows commands. Psych:  Responds to questions appropriately with a normal affect.  Labs    Chemistry Recent Labs  Lab 06/25/17 0803 06/26/17 0258 06/27/17 0352  NA 137 140 138  K 3.9 3.2* 3.7  CL 104 103 104  CO2 21* 27 25  GLUCOSE 276* 133* 138*  BUN 13 15 17   CREATININE 1.07* 0.99 0.96  CALCIUM 8.8* 9.0 8.5*  PROT 6.2*  --   --   ALBUMIN 3.3*  --   --   AST 22  --   --   ALT 16  --   --   ALKPHOS 74  --   --   BILITOT 0.5  --   --   GFRNONAA 56* >60 >60  GFRAA >60 >60 >60  ANIONGAP 12 10 9      Hematology Recent Labs  Lab 06/25/17 985 253 0633  06/26/17 0258  WBC 7.7 10.7*  RBC 3.93 3.92  HGB 11.2* 11.1*  HCT 35.1* 34.3*  MCV 89.3 87.5  MCH 28.5 28.3  MCHC 31.9 32.4  RDW 15.0 15.2  PLT 314 287    Cardiac Enzymes Recent Labs  Lab 06/25/17 0803 06/25/17 1251 06/25/17 2206  TROPONINI 0.27* 22.13* >65.00*   No results for input(s): TROPIPOC in the last 168 hours.   BNP Recent Labs  Lab 06/25/17 1251  BNP 58.0     DDimer No results for input(s): DDIMER in the last 168 hours.   Radiology    No results found.  Cardiac Studies  As below   Patient Profile     62 y.o. female with IDDM, anxiety, goiter, TIA, sleep apnea, breast CA, HTN, HLD, pituitary tumor admitted with complete heart block and inferolateral STEMI complicated by cardiogenic shock.  Assessment & Plan    1. CAD/STEMI - culprit large dominant LCx s/p aspiration thrombectomy and DES; Mild nonobstructive disease in the left main, LAD, and nondominant RCA without significant stenosis in any  of those vessels, EF 50%. Continue aspirin, Brilinta, statin, beta blocker. Consideration can be given to titration of beta blocker given that HR still goes over 100 at times. Have to be cognizant of BP as well. She notes some new RLQ pain around cath site, no overt findings. Will check stat labs including hemoglobin to reassess. D/w Dr. Claiborne Billings -> monitor for now, further recs pending CBC.  2. Cardiogenic shock with acute systolic CHF with elevated LVEDP during cath - clinically improved. EF 50% by cath with segmental WMA; echo 06/26/17 with EF 40-45%, hypokinesis of the   entireinferolateral myocardium; consistent with ischemia in the distribution of the left circumflex coronary artery, normal diastolic function, mild MR. BP softer at times so would not add spironolactone just yet. Continue BB/ARB.  3. Complete heart block - tx with dopamine/PCI with resolution. Now tolerating beta blocker.  4. HTN - BP controlled on present regimen.  5. OSA - has not been using CPAP as OP. Will need sleep f/u as OP.  6. HLD - continue atorvastatin. If the patient is tolerating statin at time of follow-up appointment, would consider rechecking liver function/lipid panel in 6-8 weeks. Was only on low dose Crestor as OP.  7. Obesity - lifestyle modification recommended.  For questions or updates, please contact [place] Please consult [email or web address] for contact info under Cardiology/STEMI.  Signed, Charlie Pitter, PA-C   06/28/2017, 12:04 PM     Patient seen and examined. Agree with assessment and plan. No angina.  Will titrate metoprolol with resting pulse 85.  Mild muscular tenderness to low abdomen. No hematoma. CBC f/u pending.  Now using CPAP started in hospital; sleeping better.   Troy Sine, MD, [facility] 06/28/2017 1:44 PM

## 2017-06-29 ENCOUNTER — Encounter (HOSPITAL_COMMUNITY): Payer: Self-pay | Admitting: Physician Assistant

## 2017-06-29 ENCOUNTER — Other Ambulatory Visit: Payer: Self-pay | Admitting: Cardiovascular Disease

## 2017-06-29 ENCOUNTER — Telehealth: Payer: Self-pay | Admitting: Physician Assistant

## 2017-06-29 DIAGNOSIS — G4733 Obstructive sleep apnea (adult) (pediatric): Secondary | ICD-10-CM

## 2017-06-29 DIAGNOSIS — I2510 Atherosclerotic heart disease of native coronary artery without angina pectoris: Secondary | ICD-10-CM

## 2017-06-29 DIAGNOSIS — I5021 Acute systolic (congestive) heart failure: Secondary | ICD-10-CM

## 2017-06-29 DIAGNOSIS — Z8679 Personal history of other diseases of the circulatory system: Secondary | ICD-10-CM

## 2017-06-29 DIAGNOSIS — I10 Essential (primary) hypertension: Secondary | ICD-10-CM

## 2017-06-29 DIAGNOSIS — Z9989 Dependence on other enabling machines and devices: Secondary | ICD-10-CM

## 2017-06-29 LAB — BASIC METABOLIC PANEL
ANION GAP: 8 (ref 5–15)
BUN: 15 mg/dL (ref 6–20)
CALCIUM: 8.9 mg/dL (ref 8.9–10.3)
CHLORIDE: 107 mmol/L (ref 101–111)
CO2: 25 mmol/L (ref 22–32)
Creatinine, Ser: 1 mg/dL (ref 0.44–1.00)
GFR calc non Af Amer: >60 mL/min (ref >60)
GLUCOSE: 122 mg/dL — AB (ref 65–99)
Potassium: 4.3 mmol/L (ref 3.5–5.1)
Sodium: 140 mmol/L (ref 135–145)

## 2017-06-29 LAB — CBC
HEMATOCRIT: 34.5 % — AB (ref 36.0–46.0)
HEMOGLOBIN: 10.9 g/dL — AB (ref 12.0–15.0)
MCH: 28.3 pg (ref 26.0–34.0)
MCHC: 31.6 g/dL (ref 30.0–36.0)
MCV: 89.6 fL (ref 78.0–100.0)
Platelets: 298 10*3/uL (ref 150–400)
RBC: 3.85 MIL/uL — ABNORMAL LOW (ref 3.87–5.11)
RDW: 15.4 % (ref 11.5–15.5)
WBC: 7.3 10*3/uL (ref 4.0–10.5)

## 2017-06-29 LAB — GLUCOSE, CAPILLARY
GLUCOSE-CAPILLARY: 92 mg/dL (ref 65–99)
Glucose-Capillary: 104 mg/dL — ABNORMAL HIGH (ref 65–99)

## 2017-06-29 MED ORDER — ASPIRIN 81 MG PO TBEC: 81.0000 mg | DELAYED_RELEASE_TABLET | Freq: Every day | ORAL | 3 refills | Status: DC

## 2017-06-29 MED ORDER — ATORVASTATIN CALCIUM 80 MG PO TABS: 80.0000 mg | ORAL_TABLET | Freq: Every evening | ORAL | 6 refills | Status: DC

## 2017-06-29 MED ORDER — METOPROLOL TARTRATE 25 MG PO TABS: 37.5000 mg | ORAL_TABLET | Freq: Two times a day (BID) | ORAL | 6 refills | Status: DC

## 2017-06-29 MED ORDER — TICAGRELOR 90 MG PO TABS: 90.0000 mg | ORAL_TABLET | Freq: Two times a day (BID) | ORAL | 3 refills | Status: DC

## 2017-06-29 MED ORDER — IRBESARTAN 75 MG PO TABS: 75.0000 mg | ORAL_TABLET | Freq: Every day | ORAL | 6 refills | Status: DC

## 2017-06-29 MED ORDER — IRBESARTAN 75 MG PO TABS
75.0000 mg | ORAL_TABLET | Freq: Every day | ORAL | Status: DC
  Administered 2017-06-29: 75 mg via ORAL
  Filled 2017-06-29: qty 1

## 2017-06-29 MED ORDER — NITROGLYCERIN 0.4 MG SL SUBL: 0.4000 mg | SUBLINGUAL_TABLET | SUBLINGUAL | 3 refills | Status: DC | PRN

## 2017-06-29 NOTE — Telephone Encounter (Signed)
New message    TCM appt scheduled for 3/15 with Richardson Dopp

## 2017-06-29 NOTE — Progress Notes (Signed)
CARDIAC REHAB PHASE I   PRE:  Rate/Rhythm: 84 SR  BP:  Supine: 125/71  Sitting:   Standing:    SaO2:   MODE:  Ambulation: 400 ft   POST:  Rate/Rhythm: 95 SR  BP:  Supine:   Sitting: 131/71  Standing:    SaO2:  1013-1030 Pt walked 400 ft on RA with slow steady gait. No CP. Groin less sore today. Tolerated well.   Graylon Good, RN BSN  06/29/2017 10:28 AM

## 2017-06-29 NOTE — Discharge Summary (Addendum)
Discharge Summary    Patient ID: Destiny Beasley,  MRN: [id number], DOB/AGE: 1955/09/15 62 y.o.  Admit date: 06/25/2017 Discharge date: 06/29/2017  Primary Care Provider: Lawerance Cruel Primary Cardiologist: Dr. Burt Knack  Discharge Diagnoses    Principal Problem:   STEMI involving left circumflex coronary artery [facility]) Active Problems:   CAD in native artery   Hyperlipidemia   Essential hypertension   Cardiogenic shock ([place])   Acute systolic heart failure (HCC)   Complete heart block (HCC)   OSA (obstructive sleep apnea)    Diagnostic Studies/Procedures    2D echo 06/26/17 Study Conclusions  - Left ventricle: The cavity size was normal. Systolic function was   mildly to moderately reduced. The estimated ejection fraction was   in the range of 40% to 45%. Hypokinesis of the   entireinferolateral myocardium; consistent with ischemia in the   distribution of the left circumflex coronary artery. Left   ventricular diastolic function parameters were normal. - Mitral valve: There was mild regurgitation.   Cath 06/25/17 Procedures   Coronary/Graft Acute MI Revascularization  CORONARY STENT INTERVENTION  LEFT HEART CATH AND CORONARY ANGIOGRAPHY  RIGHT HEART CATH  Conclusion     There is mild to moderate left ventricular systolic dysfunction.  LV end diastolic pressure is moderately elevated.  The left ventricular ejection fraction is 45-50% by visual estimate.  Prox LAD to Mid LAD lesion is 30% stenosed.  Prox Cx lesion is 100% stenosed.  A drug-eluting stent was successfully placed using a STENT SYNERGY DES 4X20.  Post intervention, there is a 0% residual stenosis.   1.  Acute inferolateral STEMI involving a large, dominant left circumflex complicated by cardiogenic shock and complete heart block 2.  Successful PCI of the left circumflex using aspiration thrombectomy and drug-eluting stent implantation 3.  Mild nonobstructive disease in the left main, LAD,  and nondominant RCA without significant stenosis in any of those vessels 4.  Mild segmental contraction abnormality of the left ventricle consistent with a left circumflex/inferior infarct, with mild segmental LV systolic dysfunction, LVEF estimated at 50% 5.  Acute systolic heart failure with elevated LVEDP and mildly elevated right heart pressures  Recommendations: The patient will be transferred to the cardiac ICU for continued post MI care.  We will continue Cangrelor for 2 hours.  She is loaded with Brilinta 180 mg at the completion of the procedure.  Post MI medical therapy will be instituted.  We will wean her off of dopamine as tolerated.      _____________     History of Present Illness     Destiny Beasley is a 62 y.o. female with history of IDDM, anxiety, goiter, TIA, sleep apnea, breast CA, HTN, HLD, pituitary tumor who developed chest pain approximately 6-7 hours prior to admission.  During her evaluation in the emergency department, she developed severe hypotension and complete heart block. An EKG demonstrated complete heart block with an inferolateral STEMI pattern and she was subsequently admitted for cardiac cath and hemodynamic support.  Hospital Course    1. CAD/STEMI - underwent emergent cath showing culprit large dominant LCx s/p aspiration thrombectomy and DES. There was otherwise mild nonobstructive disease in the left main, LAD, and nondominant RCA without significant stenosis in any of those vessels, EF 50%. She was started on aspirin, Brilinta, & beta blocker. Statin was titrated as below. 30 day free Brilinta RX given along with refills. She did have some sharp atypical RLQ pain in the generalized area  of her abdomen and cath site yesterday but this appeared to be musculoskeletal as Hgb remained stable and cath site itself was without complication, hematoma or bruit. She was advised to remain out of work until cleared in follow-up.  2. Cardiogenic shock with acute  systolic CHF with elevated LVEDP during cath - clinically improved following PCI. She was on dopamine for a brief period of time. EF 50% by cath with segmental WMA; echo 06/26/17 with EF 40-45%, hypokinesis of theentireinferolateral myocardium; consistent with ischemia in thedistribution of the left circumflex coronary artery, normal diastolic function, mild MR. Irbesartan was resumed but at lower dose than home dose. Once she did not exhibit any further bradycardia, BB was added and titrated. BP was softer at times so spironolactone was not added. She was also given information on low sodium diet, daily weights, 2L fluid restriction.  3. Complete heart block -tx with dopamine/PCI with resolution, likely related to acute infarct. Now tolerating beta blocker. One brief sinus pause on telemetry was noted yesterday (1.8sec) but no recurrent AV block. Dr. Claiborne Billings OK'd her to continue metoprolol.  She was on diltiazem at home but this was not continued in lieu of titration above.  4. HTN - meds adjusted this admission to optimize rx for CAD/LV dysfunction - tolerating BB and ARB at present time with BP in the 114-120 range today. Was on diltiazem and HCTZ at home which were discontinued.  5. OSA - has not been using CPAP as OP, but was offered here inpatient. Will need sleep f/u as OP. Per Dr. Claiborne Billings sent message to his nurse to help arrange outpatient sleep follow-up.  6. HLD - Was only on low dose Crestor as OP. She was started on high intensity atorvastatin.If the patient is tolerating statin at time of follow-up appointment, would consider rechecking liver function/lipid panel in 6-8 weeks.  7. Obesity - lifestyle modification recommended.  8. Anemia - Hgb has remained relatively stable from admission value (11.3->10.9->10.9) but decreased compared to 2 years ago. Consider CBC in f/u. F/u PCP as well.  9. DM - uses insulin pump and Victoza.  Dr. Claiborne Billings has seen and examined the patient today and  feels she is stable for discharge. Of note the patient was on Effexor 150mg  as OP. This was listed as 75mg  as inpatient but ordered from [facility] which was entered during acute emergency. I confirmed with the patient that she is on 150mg  daily and spoke with pharmacy who confirms no specific issues with her above medical problems.    _____________  Discharge Vitals Blood pressure 114/60, pulse 60, temperature 98.3 F (36.8 C), temperature source Oral, resp. rate 16, height 6' (1.829 m), weight 241 lb 8 oz (109.5 kg), SpO2 100 %.  Filed Weights   06/27/17 0738 06/28/17 0521 06/29/17 0417  Weight: 243 lb (110.2 kg) 243 lb 3.2 oz (110.3 kg) 241 lb 8 oz (109.5 kg)    Labs & Radiologic Studies    CBC Recent Labs    06/28/17 1434 06/29/17 0606  WBC 7.6 7.3  HGB 10.9* 10.9*  HCT 34.6* 34.5*  MCV 89.9 89.6  PLT 295 803   Basic Metabolic Panel Recent Labs    06/28/17 1434 06/29/17 0606  NA 139 140  K 3.6 4.3  CL 104 107  CO2 26 25  GLUCOSE 192* 122*  BUN 16 15  CREATININE 1.10* 1.00  CALCIUM 8.6* 8.9  _____________  No results found. Disposition   Pt is being discharged home today in  good condition.  Follow-up Plans & Appointments    Follow-up Information    Liliane Shi, PA-C Follow up.   Specialties:  Cardiology, Physician Assistant Why:  [facility] - 07/06/17 at 11:15am. You should arrive 15 minutes prior to appointment to park and register. Richardson Dopp is one of the physician assistants that works with Dr. Burt Knack and his care team. This is at our [facility] location. Contact information: [address] [place] [address] [phone]        Troy Sine, MD Follow up.   Specialty:  Cardiology Why:  Dr. Evette Georges nurse has been sent a message to help you arrange sleep apnea follow-up. He works out of our [employer] location. Contact information: [address] [place] [address] [phone]        Lawerance Cruel, MD Follow up.   Specialty:  Family Medicine Why:  Blood count showed mild anemia - please follow up for this and general medical issues within 1 week of discharge. Contact information: [place] [address] [phone]          Discharge Instructions    Amb Referral to Cardiac Rehabilitation   Complete by:  As directed    Diagnosis:   STEMI Coronary Stents     Diet - low sodium heart healthy   Complete by:  As directed    Increase activity slowly   Complete by:  As directed    No driving for 2 weeks. No lifting over 10 lbs for 4 weeks. No sexual activity for 4 weeks. You may not return to work until cleared by your cardiologist. Keep procedure site clean & dry. If you notice increased pain, swelling, bleeding or pus, call/return!  You may shower, but no soaking baths/hot tubs/pools for 1 week.  One of your heart tests showed weakness of the heart muscle this admission. This may make you more susceptible to weight gain from fluid retention, which can lead to symptoms that we call heart failure. Please follow these special instructions:  1. Follow a low-salt diet - you are allowed no more than 2,000mg  of sodium per day. Watch your fluid intake. In general, you should not be taking in more than 2 liters of fluid per day (no more than 8 glasses per day). This includes sources of water in foods like soup, coffee, tea, milk, etc. 2. Weigh yourself on the same scale at same time of day and keep a log. 3. Call your doctor: (Anytime you feel any of the following symptoms)  - 3lb weight gain overnight or 5lb within a few days - Shortness of breath, with or without a dry hacking cough  - Swelling in the hands, feet or stomach  - If you have to sleep on extra pillows at night in order to breathe   IT IS IMPORTANT TO LET YOUR DOCTOR KNOW EARLY ON IF YOU ARE HAVING SYMPTOMS SO WE CAN HELP YOU!      Discharge Medications   Allergies as of 06/29/2017      Reactions    Hydrocodone Other (See Comments)   Severe anxiety Confusion   Invokana [canagliflozin] Other (See Comments)   Constant yeast infections      Medication List    STOP taking these medications   diltiazem 180 MG 24 hr capsule Commonly known as:  CARDIZEM CD   hydrochlorothiazide 25 MG tablet Commonly known as:  HYDRODIURIL   rosuvastatin 5 MG tablet  Commonly known as:  CRESTOR     TAKE these medications   aspirin 81 MG EC tablet Take 1 tablet (81 mg total) by mouth daily. Start taking on:  06/30/2017   atorvastatin 80 MG tablet Commonly known as:  LIPITOR Take 1 tablet (80 mg total) by mouth every evening.   Biotin 10000 MCG Tabs Take by mouth.   Coenzyme Q-10 100 MG capsule Take 100 mg by mouth daily.   DULERA 100-5 MCG/ACT Aero Generic drug:  mometasone-formoterol Inhale 2 puffs into the lungs 2 (two) times daily as needed for wheezing or shortness of breath (allergies).   Insulin Lispro 200 UNIT/ML Sopn Commonly known as:  HUMALOG KWIKPEN Inject 50 Units into the skin daily. Use in insulin pump.   irbesartan 75 MG tablet Commonly known as:  AVAPRO Take 1 tablet (75 mg total) by mouth daily. What changed:    medication strength  how much to take   liraglutide 18 MG/3ML Sopn Commonly known as:  VICTOZA ADMINISTER 1.8 MG UNDER THE SKIN DAILY   metoprolol tartrate 25 MG tablet Commonly known as:  LOPRESSOR Take 1.5 tablets (37.5 mg total) by mouth 2 (two) times daily.   MULTIPLE VITAMIN PO Take 1 tablet daily by mouth.   nitroGLYCERIN 0.4 MG SL tablet Commonly known as:  NITROSTAT Place 1 tablet (0.4 mg total) under the tongue every 5 (five) minutes as needed for chest pain (up to 3 doses. If taking 3rd dose call 911).   ticagrelor 90 MG Tabs tablet Commonly known as:  BRILINTA Take 1 tablet (90 mg total) by mouth 2 (two) times daily.   venlafaxine XR 150 MG 24 hr capsule Commonly known as:  EFFEXOR-XR Take 150 mg by mouth daily.   VITAMIN D-3  PO Take by mouth.        Allergies:  Allergies  Allergen Reactions  . Hydrocodone Other (See Comments)    Severe anxiety Confusion   . Invokana [Canagliflozin] Other (See Comments)    Constant yeast infections    Aspirin prescribed at discharge?  Yes High Intensity Statin Prescribed? (Lipitor 40-80mg  or Crestor 20-40mg ): Yes Beta Blocker Prescribed? Yes For EF <40%, was ACEI/ARB Prescribed? Yes ADP Receptor Inhibitor Prescribed? (i.e. Plavix etc.-Includes Medically Managed Patients): Yes For EF <40%, Aldosterone Inhibitor Prescribed? No: hypotension, EF >40% Was EF assessed during THIS hospitalization? Yes Was Cardiac Rehab II ordered? (Included Medically managed Patients): Yes   Outstanding Labs/Studies   N/a  Duration of Discharge Encounter   Greater than 30 minutes including physician time.  Signed, Nedra Hai Dunn PA-C 06/29/2017, 1:18 PM

## 2017-06-29 NOTE — Progress Notes (Addendum)
Progress Note  Patient Name: Destiny Beasley Date of Encounter: 06/29/2017  Primary Cardiologist: Dr. Burt Knack  Subjective   Feeling much better this AM. No complaints.  Inpatient Medications    Scheduled Meds: . aspirin EC  81 mg Oral Daily  . atorvastatin  80 mg Oral q1800  . chlorhexidine  15 mL Mouth Rinse BID  . heparin  5,000 Units Subcutaneous Q8H  . insulin pump   Subcutaneous TID AC, HS, 0200  . liraglutide  1.8 mg Subcutaneous Daily  . mouth rinse  15 mL Mouth Rinse q12n4p  . metoprolol tartrate  37.5 mg Oral BID  . mometasone-formoterol  2 puff Inhalation BID  . sodium chloride flush  3 mL Intravenous Q12H  . ticagrelor  90 mg Oral BID  . venlafaxine XR  75 mg Oral Daily   Continuous Infusions: . sodium chloride     PRN Meds: sodium chloride, acetaminophen, alum & mag hydroxide-simeth, diazepam, magnesium hydroxide, nitroGLYCERIN, ondansetron (ZOFRAN) IV, sodium chloride flush   Vital Signs    Vitals:   06/28/17 2132 06/29/17 0024 06/29/17 0417 06/29/17 0828  BP:   120/65 114/60  Pulse: 80 84 98 60  Resp: 17 17 16    Temp:   98.3 F (36.8 C)   TempSrc:   Oral   SpO2: 99% 99% 100%   Weight:   241 lb 8 oz (109.5 kg)   Height:        Intake/Output Summary (Last 24 hours) at 06/29/2017 0948 Last data filed at 06/29/2017 0832 Gross per 24 hour  Intake 1123 ml  Output -  Net 1123 ml   Filed Weights   06/27/17 0738 06/28/17 0521 06/29/17 0417  Weight: 243 lb (110.2 kg) 243 lb 3.2 oz (110.3 kg) 241 lb 8 oz (109.5 kg)    Telemetry    NSR one sinus pause yesterday afternoon otherwise normal- Personally Reviewed   Physical Exam   GEN: No acute distress.  HEENT: Normocephalic, atraumatic, sclera non-icteric. Neck: No JVD or bruits. Cardiac: RRR no murmurs, rubs, or gallops.  Radials/DP/PT 1+ and equal bilaterally.  Respiratory: Clear to auscultation bilaterally. Breathing is unlabored. GI: Soft, nontender, non-distended, BS +x 4. MS: no  deformity. Extremities: No clubbing or cyanosis. No edema. Distal pedal pulses are 2+ and equal bilaterally. Right groin cath site without hematoma, ecchymosis, or bruit. Neuro:  AAOx3. Follows commands. Psych:  Responds to questions appropriately with a normal affect.  Labs    Chemistry Recent Labs  Lab 06/25/17 0803  06/27/17 0352 06/28/17 1434 06/29/17 0606  NA 137   < > 138 139 140  K 3.9   < > 3.7 3.6 4.3  CL 104   < > 104 104 107  CO2 21*   < > 25 26 25   GLUCOSE 276*   < > 138* 192* 122*  BUN 13   < > 17 16 15   CREATININE 1.07*   < > 0.96 1.10* 1.00  CALCIUM 8.8*   < > 8.5* 8.6* 8.9  PROT 6.2*  --   --   --   --   ALBUMIN 3.3*  --   --   --   --   AST 22  --   --   --   --   ALT 16  --   --   --   --   ALKPHOS 74  --   --   --   --   BILITOT 0.5  --   --   --   --  GFRNONAA 56*   < > >60 54* >60  GFRAA >60   < > >60 >60 >60  ANIONGAP 12   < > 9 9 8    < > = values in this interval not displayed.     Hematology Recent Labs  Lab 06/26/17 0258 06/28/17 1434 06/29/17 0606  WBC 10.7* 7.6 7.3  RBC 3.92 3.85* 3.85*  HGB 11.1* 10.9* 10.9*  HCT 34.3* 34.6* 34.5*  MCV 87.5 89.9 89.6  MCH 28.3 28.3 28.3  MCHC 32.4 31.5 31.6  RDW 15.2 15.4 15.4  PLT 287 295 298    Cardiac Enzymes Recent Labs  Lab 06/25/17 0803 06/25/17 1251 06/25/17 2206  TROPONINI 0.27* 22.13* >65.00*   No results for input(s): TROPIPOC in the last 168 hours.   BNP Recent Labs  Lab 06/25/17 1251  BNP 58.0     DDimer No results for input(s): DDIMER in the last 168 hours.   Radiology    No results found.  Patient Profile     62 y.o. female with IDDM, anxiety, goiter, TIA, sleep apnea, breast CA, HTN, HLD, pituitary tumor admitted with complete heart block and inferolateral STEMI complicated by cardiogenic shock.  Assessment & Plan     1. CAD/STEMI - culprit large dominant LCx s/p aspiration thrombectomy and DES; Mild nonobstructive disease in the left main, LAD, and nondominant  RCA without significant stenosis in any of those vessels, EF 50%. Continue aspirin, Brilinta, statin, beta blocker. She did have some sharp atypical RLQ pain in the generalized area of her abdomen and cath site but this appeared to be musculoskeletal as Hgb remained stable and cath site itself was without complication, hematoma or bruit.  2. Cardiogenic shock with acute systolic CHF with elevated LVEDP during cath - clinically improved. EF 50% by cath with segmental WMA; echo 06/26/17 with EF 40-45%, hypokinesis of the entireinferolateral myocardium; consistent with ischemia in thedistribution of the left circumflex coronary artery, normal diastolic function, mild MR. BP softer at times so spironolactone was not added. Continue BB/ARB.  3. Complete heart block - tx with dopamine/PCI with resolution, likely related to acute infarct. Now tolerating beta blocker. One brief sinus pause on telemetry yesterday but no high grade block, will review with MD.  She was on diltiazem at home but this was not continued.  4. HTN - meds adjusted this admission to optimize rx for CAD/LV dysfunction - tolerating BB and ARB at present time with BP in the 114-120 range today. Was on diltiazem and HCTZ at home which were discontinued.  5. OSA - has not been using CPAP as OP, but was offered here inpatient. Will need sleep f/u as OP.  6. HLD - Was only on low dose Crestor as OP. She was started on high intensity atorvastatin. If the patient is tolerating statin at time of follow-up appointment, would consider rechecking liver function/lipid panel in 6-8 weeks.   7. Obesity - lifestyle modification recommended.  8. Anemia - Hgb has remained relatively stable from admission value (11.3->10.9->10.9) but decreased compared to 2 years ago. Consider CBC in f/u. F/u PCP as well.  9. DM - uses insulin pump and Victoza.  Anticipate DC today if OK with MD.  For questions or updates, please contact [place] Please  consult [email or web address] for contact info under Cardiology/STEMI.  Signed, Charlie Pitter, PA-C 06/29/2017, 9:48 AM     Patient seen and examined. Agree with assessment and plan. Feels ell today. Ambulated without difficulty. Mild muscuosleletal abd discomfort  resolved with tylenol. No recurrent angina.  OK for dc today with F/U with Dr. Burt Knack.   Troy Sine, MD, [facility] 06/29/2017 11:56 AM

## 2017-06-29 NOTE — Progress Notes (Signed)
Discharge instruction was given to pt.  Belongings and home med (Victoza) were sent home with pt.  Idolina Primer, RN

## 2017-06-29 NOTE — Telephone Encounter (Signed)
The pt is being discharged today. Will call early next week.

## 2017-07-02 ENCOUNTER — Telehealth (HOSPITAL_COMMUNITY): Payer: Self-pay

## 2017-07-02 NOTE — Telephone Encounter (Signed)
Patients insurance is active and benefits verified through [place] - No co--pay, deductible amount of $1,000/$1,000 has been met, out of pocket amount of $4,000/$2,051.51 has been met, 20% co-insurance, and no pre-authorization is required. Passport/reference [phone]  Will contact patient to see if interested in the CR program. If interested, patient will need to complete follow up appt. Once completed, patient will be contacted for scheduling upon review by the RN Navigator.

## 2017-07-02 NOTE — Telephone Encounter (Signed)
Patient contacted regarding discharge from [facility] on June 29, 2017.  Patient understands to follow up with provider Richardson Dopp, PA-C on July 06, 2017 at 11:15A at [facility]. Patient understands discharge instructions? yes Patient understands medications and regiment? yes Patient understands to bring all medications to this visit? yes

## 2017-07-02 NOTE — Telephone Encounter (Signed)
Called patient to see if interested in CR - Patient is interested in the program. Explained our scheduling process and went over insurance. Patient verbally stated she understands what she is responsible for. Will contact patient for scheduling once follow up appt has be completed.

## 2017-07-06 ENCOUNTER — Encounter: Payer: Self-pay | Admitting: Physician Assistant

## 2017-07-06 ENCOUNTER — Ambulatory Visit (INDEPENDENT_AMBULATORY_CARE_PROVIDER_SITE_OTHER): Payer: 59 | Admitting: Physician Assistant

## 2017-07-06 VITALS — BP 116/70 | HR 77 | Ht 72.0 in | Wt 241.0 lb

## 2017-07-06 DIAGNOSIS — I2510 Atherosclerotic heart disease of native coronary artery without angina pectoris: Secondary | ICD-10-CM

## 2017-07-06 DIAGNOSIS — E785 Hyperlipidemia, unspecified: Secondary | ICD-10-CM

## 2017-07-06 DIAGNOSIS — D649 Anemia, unspecified: Secondary | ICD-10-CM | POA: Diagnosis not present

## 2017-07-06 DIAGNOSIS — E119 Type 2 diabetes mellitus without complications: Secondary | ICD-10-CM

## 2017-07-06 DIAGNOSIS — I10 Essential (primary) hypertension: Secondary | ICD-10-CM

## 2017-07-06 DIAGNOSIS — I5022 Chronic systolic (congestive) heart failure: Secondary | ICD-10-CM

## 2017-07-06 DIAGNOSIS — G4733 Obstructive sleep apnea (adult) (pediatric): Secondary | ICD-10-CM | POA: Diagnosis not present

## 2017-07-06 DIAGNOSIS — I2121 ST elevation (STEMI) myocardial infarction involving left circumflex coronary artery: Secondary | ICD-10-CM | POA: Diagnosis not present

## 2017-07-06 DIAGNOSIS — Z794 Long term (current) use of insulin: Secondary | ICD-10-CM

## 2017-07-06 MED ORDER — ROSUVASTATIN CALCIUM 20 MG PO TABS: 20.0000 mg | ORAL_TABLET | Freq: Every day | ORAL | 3 refills | Status: DC

## 2017-07-06 NOTE — Patient Instructions (Addendum)
Medication Instructions:  1. STOP ATORVASTATIN   2. START CRESTOR 20 MG DAILY; NEW RX HAS BEEN SENT IN  Labwork: 1. TODAY BMET, CBC  2. IN 6-8 WEEKS YOU WILL NEED FASTING LIPID AND LIVER PANEL TO BE DONE; THIS WILL BE DONE ON Aug 31, 2017 WHEN YOU SEE SCOTT WEAVER, PAC   Testing/Procedures: NONE ORDERED TODAY  Follow-Up: [place], [place] ON Aug 31, 2017 @ 8:15  Any Other Special Instructions Will Be Listed Below (If Applicable).     If you need a refill on your cardiac medications before your next appointment, please call your pharmacy.

## 2017-07-06 NOTE — Progress Notes (Signed)
Cardiology Office Note:    Date:  07/06/2017   ID:  Destiny Beasley, DOB 12-Feb-1956, MRN [id number]  PCP:  Lawerance Cruel, MD  Cardiologist:  Sherren Mocha, MD   Referring MD: Lawerance Cruel, MD   Chief Complaint  Patient presents with  . Hospitalization Follow-up    Status post MI, PCI    History of Present Illness:    Destiny Beasley is a 62 y.o. female with insulin-dependent diabetes, prior TIA, sleep apnea, breast cancer, hypertension, hyperlipidemia.  She was admitted 3/4-3/8 with an acute inferolateral ST elevation myocardial infarction complicated by cardiogenic shock, acute systolic heart failure and complete heart block.  Emergent cardiac catheterization demonstrated an occluded proximal left circumflex which was treated with drug-eluting stent.  Echocardiogram demonstrated EF 40-45%.  She did require dopamine post procedure.  This was eventually weaned off and her heart rate improved.  She was placed on beta-blocker, ARB, dual antiplatelet therapy and high intensity statin.  Destiny Beasley returns for follow-up.  She is here alone.  She has been doing well since discharge from the hospital.  She has not had any recurrent chest discomfort.  She has not had any significant shortness of breath.  She denies orthopnea, PND or edema.  She denies dizziness or syncope.  Prior CV studies:   The following studies were reviewed today:  Echo 06/26/17 EF 40-45, inferolateral hypokinesis, mild MR  Cardiac catheterization 06/25/17 LM luminal irregularities LAD luminal irregularities, proximal 30 LCx proximal 100 RCA irregularities EF 45-50, inferolateral akinesis PCI: 4 x 20 mm Synergy DES to the proximal LCx  Echo 03/25/16 Moderate LVH, EF 60-65, normal wall motion  Carotid US 12/17 Bilateral ICA 1-39  Nuclear stress test 7/13 1. Negative for pharmacologic-stress induced ischemia. 2. Left ventricular ejection fraction 65%.  Past Medical History:  Diagnosis Date  . Anemia     . Anxiety   . CAD in native artery    a. Inf STEMI 08/4268 complicated by cardiogenic shock/complete heart block s/p emergent cath showing culprit large dominant LCx s/p aspiration thrombectomy and DES, EF 50% by cath, 40-45% by echo.  . Complete heart block, transient ([place])    a. 06/2017 in setting of acute inf MI -> resolved after PCI.  . Diabetes mellitus   . Goiter   . History of radiation therapy 06/14/2016 - 07/19/2016   Right Breast 50 Gy 25 fractions  . Hypercholesteremia   . Hypertension   . Ischemic cardiomyopathy    a. EF 50% by cath and 40-45% by echo 06/2017.  . Malignant neoplasm of upper-outer quadrant of right female breast ([place]) 02/24/2016  . Meningioma ([place])   . Obesity   . Personal history of chemotherapy   . Personal history of radiation therapy   . Pituitary tumor   . Sleep apnea    does not use every night  . Stroke [facility])    TIA - Dec 1 st, 2017   Surgical Hx: The patient  has a past surgical history that includes Cesarean section; Abdominal hysterectomy; Thyroid surgery; Pituitary surgery; Brain meningioma excision (2005); Meniscus repair (Left, 2015); Foot surgery (Bilateral, 2016); Breast lumpectomy with radioactive seed localization (Right, 04/20/2016); Breast reduction surgery (Bilateral, 10/23/2016); Breast lumpectomy (Right); Breast biopsy; Reduction mammaplasty; LEFT HEART CATH AND CORONARY ANGIOGRAPHY (N/A, 06/25/2017); RIGHT HEART CATH (N/A, 06/25/2017); Coronary/Graft Acute MI Revascularization (N/A, 06/25/2017); and CORONARY STENT INTERVENTION (N/A, 06/25/2017).   Current Medications: Current Meds  Medication Sig  . aspirin EC 81 MG EC tablet  Take 1 tablet (81 mg total) by mouth daily.  . Biotin 10000 MCG TABS Take by mouth.  . Cholecalciferol (VITAMIN D-3 PO) Take by mouth.  . Coenzyme Q-10 100 MG capsule Take 100 mg by mouth daily.  . DULERA 100-5 MCG/ACT AERO Inhale 2 puffs into the lungs 2 (two) times daily as needed for wheezing or shortness of breath  (allergies).   . Insulin Lispro (HUMALOG KWIKPEN) 200 UNIT/ML SOPN Inject 50 Units into the skin daily. Use in insulin pump.  Marland Kitchen irbesartan (AVAPRO) 75 MG tablet Take 1 tablet (75 mg total) by mouth daily.  Marland Kitchen liraglutide (VICTOZA) 18 MG/3ML SOPN ADMINISTER 1.8 MG UNDER THE SKIN DAILY  . Metoprolol Tartrate (LOPRESSOR) 25 MG tablet Take 1.5 tablets (37.5 mg total) by mouth 2 (two) times daily.  . MULTIPLE VITAMIN PO Take 1 tablet daily by mouth.  . nitroGLYCERIN (NITROSTAT) 0.4 MG SL tablet Place 1 tablet (0.4 mg total) under the tongue every 5 (five) minutes as needed for chest pain (up to 3 doses. If taking 3rd dose call 911).  . ticagrelor (BRILINTA) 90 MG TABS tablet Take 1 tablet (90 mg total) by mouth 2 (two) times daily.  Marland Kitchen venlafaxine (EFFEXOR-XR) 150 MG 24 hr capsule Take 150 mg by mouth daily.   . [DISCONTINUED] atorvastatin (LIPITOR) 80 MG tablet Take 1 tablet (80 mg total) by mouth every evening.     Allergies:   Hydrocodone; Invokana [canagliflozin]; and Atorvastatin   Social History   Tobacco Use  . Smoking status: Never Smoker  . Smokeless tobacco: Never Used  Substance Use Topics  . Alcohol use: Yes    Alcohol/week: 0.6 oz    Types: 1 Glasses of wine per week    Comment: 1 glass monthly  . Drug use: No     Family Hx: The patient's family history includes Diabetes in her mother; Hyperlipidemia in her mother; Hypertension in her mother.  ROS:   Please see the history of present illness.    Review of Systems  Respiratory: Positive for snoring.    All other systems reviewed and are negative.   EKGs/Labs/Other Test Reviewed:    EKG:  EKG is  ordered today.  The ekg ordered today demonstrates normal sinus rhythm, heart rate 77, left axis deviation, inferior Q waves, T wave inversions 2, 3, aVF, V5-V6, QTC 411 ms  Recent Labs: 06/25/2017: ALT 16; B Natriuretic Peptide 58.0 06/29/2017: BUN 15; Creatinine, Ser 1.00; Hemoglobin 10.9; Platelets 298; Potassium 4.3; Sodium 140    Recent Lipid Panel Lab Results  Component Value Date/Time   CHOL 113 06/26/2017 02:58 AM   TRIG 109 06/26/2017 02:58 AM   HDL 37 (L) 06/26/2017 02:58 AM   CHOLHDL 3.1 06/26/2017 02:58 AM   LDLCALC 54 06/26/2017 02:58 AM    Physical Exam:    VS:  BP 116/70   Pulse 77   Ht 6' (1.829 m)   Wt 241 lb (109.3 kg)   SpO2 93%   BMI 32.69 kg/m     Wt Readings from Last 3 Encounters:  07/06/17 241 lb (109.3 kg)  06/29/17 241 lb 8 oz (109.5 kg)  06/13/17 243 lb (110.2 kg)     Physical Exam  Constitutional: She is oriented to person, place, and time. She appears well-developed and well-nourished. No distress.  HENT:  Head: Normocephalic and atraumatic.  Neck: No JVD present.  Cardiovascular: Normal rate and regular rhythm.  No murmur heard. Pulmonary/Chest: Effort normal. She has no rales.  Abdominal:  Soft.  Musculoskeletal: She exhibits no edema.  Right femoral arteriotomy site without hematoma or bruit  Neurological: She is alert and oriented to person, place, and time.  Skin: Skin is warm and dry.    ASSESSMENT & PLAN:    #1.  STEMI involving left circumflex coronary artery (HCC) Status post recent inferoposterior ST elevation myocardial infarction complicated by cardiogenic shock, complete heart block and systolic heart failure.  She was treated with a drug-eluting stent to the left circumflex.  Since discharge, she has been doing well without recurrent angina or shortness of breath.  She is interested in cardiac rehabilitation.  However, she will not likely be able to start until May.  I have asked her to continue to increase her activity as tolerated.  We discussed the importance of dual antiplatelet therapy for minimum of 12 months.  I have given her a note today to return to work next week (she works for an [employer]; desk work).  #2.  Coronary artery disease No recurrent angina.  Continue aspirin, ticagrelor, irbesartan, metoprolol.  She has been  intolerant to atorvastatin.  This will be changed to rosuvastatin.  #3.  Chronic systolic heart failure ([place]) Secondary to ischemic cardiomyopathy.  EF 40-45.  Volume is stable without evidence of volume excess.  She is not on diuretic therapy.  Continue beta-blocker, ARB.  #4.  Essential hypertension The patient's blood pressure is controlled on her current regimen.  Continue current therapy.  Check BMET today.  #5.  Hyperlipidemia, unspecified hyperlipidemia type She notes intolerance to atorvastatin.  She has been dizzy after taking her dose each day.  She has had issues with other statins in the past.  She was able to tolerate low-dose rosuvastatin in the past.  I have recommended that we switch her to rosuvastatin 20 mg daily.  -DC atorvastatin  -Start rosuvastatin 20 mill grams daily  -Check lipids and LFTs in 6-8 weeks  #6.  OSA (obstructive sleep apnea) Dr. Claiborne Billings has suggested follow-up sleep study.  This is being arranged through his office.  #7.  Type 2 diabetes mellitus without complication, with long-term current use of insulin ([place]) Continue follow-up with primary care.  #8.  Anemia, unspecified type Mild anemia noted in the hospital.  Recheck CBC today.   Dispo:  Return in about 6 weeks (around 08/17/2017) for Close Follow Up, w/ Dr. Burt Knack, or Richardson Dopp, PA-C.   Medication Adjustments/Labs and Tests Ordered: Current medicines are reviewed at length with the patient today.  Concerns regarding medicines are outlined above.  Tests Ordered: Orders Placed This Encounter  Procedures  . Basic Metabolic Panel (BMET)  . CBC  . Lipid Profile  . Hepatic function panel  . EKG 12-Lead   Medication Changes: Meds ordered this encounter  Medications  . rosuvastatin (CRESTOR) 20 MG tablet    Sig: Take 1 tablet (20 mg total) by mouth daily.    Dispense:  90 tablet    Refill:  3    Signed, Richardson Dopp, PA-C  07/06/2017 2:00 PM    [facility], [place], [place]  [address] Phone: [phone]; Fax: [phone]

## 2017-07-07 LAB — BASIC METABOLIC PANEL
BUN / CREAT RATIO: 20 (ref 9–23)
BUN: 19 mg/dL (ref 6–24)
CHLORIDE: 106 mmol/L (ref 96–106)
CO2: 23 mmol/L (ref 20–29)
Calcium: 9.8 mg/dL (ref 8.7–10.2)
Creatinine, Ser: 0.93 mg/dL (ref 0.57–1.00)
GFR calc non Af Amer: 67 mL/min/{1.73_m2} (ref >59)
GFR, EST AFRICAN AMERICAN: 78 mL/min/{1.73_m2} (ref >59)
GLUCOSE: 93 mg/dL (ref 65–99)
POTASSIUM: 5 mmol/L (ref 3.5–5.2)
SODIUM: 144 mmol/L (ref 134–144)

## 2017-07-07 LAB — CBC
Hematocrit: 38 % (ref 34.0–46.6)
Hemoglobin: 12.8 g/dL (ref 11.1–15.9)
MCH: 29.5 pg (ref 26.6–33.0)
MCHC: 33.7 g/dL (ref 31.5–35.7)
MCV: 88 fL (ref 79–97)
PLATELETS: 560 10*3/uL — AB (ref 150–379)
RBC: 4.34 x10E6/uL (ref 3.77–5.28)
RDW: 15.1 % (ref 12.3–15.4)
WBC: 7 10*3/uL (ref 3.4–10.8)

## 2017-07-09 ENCOUNTER — Encounter (HOSPITAL_COMMUNITY): Payer: Self-pay | Admitting: Cardiovascular Disease

## 2017-07-09 ENCOUNTER — Telehealth: Payer: Self-pay | Admitting: *Deleted

## 2017-07-10 ENCOUNTER — Telehealth (HOSPITAL_COMMUNITY): Payer: Self-pay

## 2017-07-10 NOTE — Telephone Encounter (Signed)
Patient notified of in lab sleep study appointment scheduled 07/23/17.

## 2017-07-10 NOTE — Telephone Encounter (Signed)
Attempted to call patient in regards to Cardiac Rehab - lm on vm °

## 2017-07-17 ENCOUNTER — Telehealth: Payer: Self-pay

## 2017-07-17 ENCOUNTER — Telehealth: Payer: Self-pay | Admitting: *Deleted

## 2017-07-17 NOTE — Telephone Encounter (Signed)
Victoza PA initiated via CoverMyMeds.

## 2017-07-17 NOTE — Telephone Encounter (Signed)
Patient called and stated she can not get her Victoza she was told she needs a letter of medical necessity since she gets sick from metformin- please advise if this can be done

## 2017-07-17 NOTE — Telephone Encounter (Signed)
Please start prior authorization

## 2017-07-18 ENCOUNTER — Telehealth (HOSPITAL_COMMUNITY): Payer: Self-pay

## 2017-07-18 NOTE — Telephone Encounter (Signed)
PA for Victoza has been approved for 07/17/17 until 07/17/18 and patient has been notified

## 2017-07-18 NOTE — Telephone Encounter (Signed)
Called and spoke with patient in regards to Cardiac Rehab - Scheduled orientation on 09/20/2017 at 7:30am. Patient will attend the 8:15am exc class. Mailed packet.

## 2017-07-22 ENCOUNTER — Encounter: Payer: Self-pay | Admitting: Endocrinology

## 2017-07-23 ENCOUNTER — Ambulatory Visit (HOSPITAL_BASED_OUTPATIENT_CLINIC_OR_DEPARTMENT_OTHER): Payer: 59 | Attending: Cardiovascular Disease | Admitting: Cardiovascular Disease

## 2017-07-23 VITALS — Ht 72.0 in | Wt 235.0 lb

## 2017-07-23 DIAGNOSIS — I5021 Acute systolic (congestive) heart failure: Secondary | ICD-10-CM | POA: Diagnosis not present

## 2017-07-23 DIAGNOSIS — I10 Essential (primary) hypertension: Secondary | ICD-10-CM

## 2017-07-23 DIAGNOSIS — E119 Type 2 diabetes mellitus without complications: Secondary | ICD-10-CM | POA: Diagnosis not present

## 2017-07-23 DIAGNOSIS — Z7902 Long term (current) use of antithrombotics/antiplatelets: Secondary | ICD-10-CM | POA: Diagnosis not present

## 2017-07-23 DIAGNOSIS — Z6832 Body mass index (BMI) 32.0-32.9, adult: Secondary | ICD-10-CM | POA: Insufficient documentation

## 2017-07-23 DIAGNOSIS — Z7982 Long term (current) use of aspirin: Secondary | ICD-10-CM | POA: Insufficient documentation

## 2017-07-23 DIAGNOSIS — I2510 Atherosclerotic heart disease of native coronary artery without angina pectoris: Secondary | ICD-10-CM | POA: Diagnosis not present

## 2017-07-23 DIAGNOSIS — Z79899 Other long term (current) drug therapy: Secondary | ICD-10-CM | POA: Insufficient documentation

## 2017-07-23 DIAGNOSIS — I110 Hypertensive heart disease with heart failure: Secondary | ICD-10-CM | POA: Insufficient documentation

## 2017-07-23 DIAGNOSIS — E669 Obesity, unspecified: Secondary | ICD-10-CM | POA: Insufficient documentation

## 2017-07-23 DIAGNOSIS — Z794 Long term (current) use of insulin: Secondary | ICD-10-CM | POA: Diagnosis not present

## 2017-07-23 DIAGNOSIS — G4733 Obstructive sleep apnea (adult) (pediatric): Secondary | ICD-10-CM | POA: Diagnosis not present

## 2017-07-24 MED ORDER — INSULIN DEGLUDEC 200 UNIT/ML ~~LOC~~ SOPN: 70.0000 [IU] | PEN_INJECTOR | Freq: Every day | SUBCUTANEOUS | 0 refills | Status: DC

## 2017-07-29 ENCOUNTER — Encounter: Payer: Self-pay | Admitting: Physician Assistant

## 2017-07-30 ENCOUNTER — Telehealth: Payer: Self-pay | Admitting: Nurse Practitioner

## 2017-07-30 DIAGNOSIS — I5022 Chronic systolic (congestive) heart failure: Secondary | ICD-10-CM

## 2017-07-30 MED ORDER — FUROSEMIDE 20 MG PO TABS: 20.0000 mg | ORAL_TABLET | Freq: Every day | ORAL | 3 refills | Status: DC

## 2017-07-30 NOTE — Telephone Encounter (Signed)
Reviewed patient's concern with Dr. Saunders Revel who advised that patient continue furosemide 20 mg daily. He advised she come in for bmet later this week and not start potassium supplement until bmet results are available. She is aware refills have been sent for her furosemide 20 mg. Dr. Saunders Revel advised she have follow-up appointment next week with APP or Dr. Burt Knack. She is scheduled for lab appointment on Thursday 4/11. I advised her that I am working on getting an appointment for her with an APP for next week. I am sending a message to [facility] for an appointment due to difficulty finding an appointment. I advised patient to continue to monitor weight and swelling and to call back with worsening symptoms. Patient verbalized understanding and agreement with plan and thanked me for the call.

## 2017-07-30 NOTE — Telephone Encounter (Signed)
Called patient regarding advice request received this morning in Triage. Patient states she has been gaining weight x 2 weeks. She states her weight on 3/18 was 235 lb and today is 238 lb. States weight got as high as 245 lb but she had furosemide 20 mg from PCP, Dr. Harrington Challenger which she has started taking. She reports increased urinary output with furosemide but states that she does have some SOB. She is able to speak in clear complete sentences without distress. She reports abdominal swelling. States no potassium supplement was prescribed with furosemide. I advised that Dr. Burt Knack is unavailable today but that I will review her concern with Dr. Saunders Revel, our DOD. She verbalized understanding and agreement and thanked me for the call.

## 2017-07-31 NOTE — Telephone Encounter (Signed)
Scheduled patient for evaluation next Thursday.  She understands 5/10 visit will be cancelled at that time if not needed. She was grateful for call and agrees with treatment plan.

## 2017-07-31 NOTE — Telephone Encounter (Signed)
-----   Message from Emmaline Life, RN sent at 07/30/2017  9:41 AM EDT ----- Dr. Antionette Char patient needs an APP appt next week per Dr. Saunders Revel (DOD). I have searched schedules and have not been able to find anything but need to move on to other patients. Please call patient with appointment when found  Thanks, Sharyn Lull

## 2017-08-02 ENCOUNTER — Other Ambulatory Visit: Payer: 59 | Admitting: *Deleted

## 2017-08-02 DIAGNOSIS — I5022 Chronic systolic (congestive) heart failure: Secondary | ICD-10-CM

## 2017-08-02 LAB — BASIC METABOLIC PANEL
BUN/Creatinine Ratio: 15 (ref 9–23)
BUN: 15 mg/dL (ref 6–24)
CHLORIDE: 98 mmol/L (ref 96–106)
CO2: 28 mmol/L (ref 20–29)
CREATININE: 1.02 mg/dL — AB (ref 0.57–1.00)
Calcium: 10.1 mg/dL (ref 8.7–10.2)
GFR calc Af Amer: 70 mL/min/{1.73_m2} (ref >59)
GFR calc non Af Amer: 60 mL/min/{1.73_m2} (ref >59)
GLUCOSE: 84 mg/dL (ref 65–99)
POTASSIUM: 4.4 mmol/L (ref 3.5–5.2)
SODIUM: 139 mmol/L (ref 134–144)

## 2017-08-03 ENCOUNTER — Other Ambulatory Visit (INDEPENDENT_AMBULATORY_CARE_PROVIDER_SITE_OTHER): Payer: 59

## 2017-08-03 ENCOUNTER — Encounter: Payer: Self-pay | Admitting: Physician Assistant

## 2017-08-03 DIAGNOSIS — Z794 Long term (current) use of insulin: Secondary | ICD-10-CM | POA: Diagnosis not present

## 2017-08-03 DIAGNOSIS — E1165 Type 2 diabetes mellitus with hyperglycemia: Secondary | ICD-10-CM | POA: Diagnosis not present

## 2017-08-03 LAB — COMPREHENSIVE METABOLIC PANEL
ALT: 21 U/L (ref 0–35)
AST: 19 U/L (ref 0–37)
Albumin: 4.4 g/dL (ref 3.5–5.2)
Alkaline Phosphatase: 104 U/L (ref 39–117)
BUN: 18 mg/dL (ref 6–23)
CO2: 30 meq/L (ref 19–32)
Calcium: 9.9 mg/dL (ref 8.4–10.5)
Chloride: 103 mEq/L (ref 96–112)
Creatinine, Ser: 1.05 mg/dL (ref 0.40–1.20)
GFR: 68.86 mL/min (ref >60.00)
GLUCOSE: 110 mg/dL — AB (ref 70–99)
Potassium: 4 mEq/L (ref 3.5–5.1)
SODIUM: 142 meq/L (ref 135–145)
Total Bilirubin: 0.3 mg/dL (ref 0.2–1.2)
Total Protein: 8.1 g/dL (ref 6.0–8.3)

## 2017-08-03 LAB — LIPID PANEL
CHOL/HDL RATIO: 3
Cholesterol: 142 mg/dL (ref 0–200)
HDL: 49 mg/dL (ref >39.00)
LDL Cholesterol: 82 mg/dL (ref 0–99)
NONHDL: 92.59
Triglycerides: 55 mg/dL (ref 0.0–149.0)
VLDL: 11 mg/dL (ref 0.0–40.0)

## 2017-08-03 LAB — HEMOGLOBIN A1C: Hgb A1c MFr Bld: 7.9 % — ABNORMAL HIGH (ref 4.6–6.5)

## 2017-08-07 ENCOUNTER — Ambulatory Visit (INDEPENDENT_AMBULATORY_CARE_PROVIDER_SITE_OTHER): Payer: 59 | Admitting: Neurology

## 2017-08-07 ENCOUNTER — Encounter: Payer: Self-pay | Admitting: Neurology

## 2017-08-07 VITALS — BP 114/78 | HR 77 | Ht 72.0 in | Wt 240.2 lb

## 2017-08-07 DIAGNOSIS — C50919 Malignant neoplasm of unspecified site of unspecified female breast: Secondary | ICD-10-CM

## 2017-08-07 DIAGNOSIS — G4733 Obstructive sleep apnea (adult) (pediatric): Secondary | ICD-10-CM

## 2017-08-07 DIAGNOSIS — I2121 ST elevation (STEMI) myocardial infarction involving left circumflex coronary artery: Secondary | ICD-10-CM

## 2017-08-07 DIAGNOSIS — Z8673 Personal history of transient ischemic attack (TIA), and cerebral infarction without residual deficits: Secondary | ICD-10-CM | POA: Diagnosis not present

## 2017-08-07 DIAGNOSIS — E119 Type 2 diabetes mellitus without complications: Secondary | ICD-10-CM | POA: Diagnosis not present

## 2017-08-07 DIAGNOSIS — I10 Essential (primary) hypertension: Secondary | ICD-10-CM | POA: Diagnosis not present

## 2017-08-07 DIAGNOSIS — Z794 Long term (current) use of insulin: Secondary | ICD-10-CM | POA: Diagnosis not present

## 2017-08-07 NOTE — Patient Instructions (Addendum)
-   Continue take ASA and brilinta for heart and stroke prevention - continue the pravastatin for heart and stroke prevention - continue follow up with [facility] in [place] - your memory test was excellent and no concerns at this time - Follow up with your primary care physician for stroke risk factor modification. Recommend maintain blood pressure goal <130/80, diabetes with hemoglobin A1c goal below 7.0% and lipids with LDL cholesterol goal below 70 mg/dL.  - check BP and glucose at home and record - follow up with oncologist as scheduled.  - diabetic diet and regular exercise - follow up as needed.

## 2017-08-07 NOTE — Progress Notes (Signed)
STROKE NEUROLOGY FOLLOW UP NOTE  NAME: Destiny Beasley DOB: [birth year]  REASON FOR VISIT: stroke follow up HISTORY FROM: pt and chart  Today we had the pleasure of seeing Destiny Beasley in follow-up at our Neurology Clinic. Pt was accompanied by no one.   History Summary 62 yo AAF with hx of hypertension, hyperlipidemia, diabetes, OSA on CPAP, breast cancer s/p lumpectomy and radiation, and right CP angle angioma s/p surgery 2005 was admitted in 03/2016 for transient left-sided numbness weakness lasted half a day. CT and MRI showed no acute stroke.  MRA negative for LVO but diffuse atherosclerosis.  Carotid Doppler unremarkable.  TTE EF 60-65%.  LDL 61 and A1c 9.1.  Patient diagnosed with TIA.  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  Interval History During the interval time, the patient has been doing well from stroke standpoint.  However, patient had MI on 06/25/17 s/p cardiac stent placement. She was on aspirin and Brilinta since.  Continue pravastatin 20.  Today's main complaint is short memory deficit.  She stated that sometimes when she was driving, she forgot direction or location.  Or her daughter complains of she forgot what she has been told.  Although her short-term memory seems getting better over time, she has concerns of memory loss and would like to be evaluated by a neurologist.  BP today 114/78.  Recent LDL 82 and A1c 7.9.  REVIEW OF SYSTEMS: Full 14 system review of systems performed and notable only for those listed below and in HPI above, all others are negative:  Constitutional: Activity change, fatigue, unexpected weight change Cardiovascular:  Ear/Nose/Throat:   Skin:  Eyes:   Respiratory: SOB Gastroitestinal: Abdominal pain Genitourinary:  Hematology/Lymphatic:   Endocrine:  Musculoskeletal:   Allergy/Immunology:   Neurological: Memory loss Psychiatric:  Sleep:   The following represents  the patient's updated allergies and side effects list: Allergies  Allergen Reactions  . Hydrocodone Other (See Comments)    Severe anxiety Confusion   . Invokana [Canagliflozin] Other (See Comments)    Constant yeast infections  . Atorvastatin Other (See Comments)    DIZZINESS    The neurologically relevant items on the patient's problem list were reviewed on today's visit.  Neurologic Examination  A problem focused neurological exam (12 or more points of the single system neurologic examination, vital signs counts as 1 point, cranial nerves count for 8 points) was performed.  Blood pressure 114/78, pulse 77, height 6' (1.829 m), weight 240 lb 3.2 oz (109 kg).  General - Well nourished, well developed, in no apparent distress.  Ophthalmologic - Sharp disc margins OU.  Cardiovascular - Regular rate and rhythm.  Mental Status -  Level of arousal and orientation to time, place, and person were intact. Language including expression, naming, repetition, comprehension was assessed and found intact. Recent and remote memory were intact. Fund of Knowledge was assessed and was intact.  [place] visuospatial - executive 5/5 Naming - 3/3 Memory -  Attention - 2/2, 1/1, 2/3 Language - 2/2, 1/1 Abstraction - 2/2 Delayed recall - 5/5 Orientation - 6/6 Total - 29/30  Cranial Nerves II - XII - II - Visual field intact OU. III, IV, VI - Extraocular movements intact. V - Facial sensation intact bilaterally. VII - Facial movement intact bilaterally. VIII - Hearing & vestibular intact bilaterally. X - Palate elevates symmetrically. XI - Chin turning & shoulder shrug intact bilaterally. XII - Tongue protrusion intact.  Motor Strength - The patient's strength was normal in all extremities and pronator drift was absent.  Bulk was normal and fasciculations were absent.   Motor Tone - Muscle tone was assessed at the neck and appendages and was normal.  Reflexes - The patient's reflexes  were 1+ in all extremities and she had no pathological reflexes.  Sensory - Light touch, temperature/pinprick, vibration and proprioception, and Romberg testing were assessed and were normal.    Coordination - The patient had normal movements in the hands and feet with no ataxia or dysmetria.  Tremor was absent.  Gait and Station - The patient's transfers, posture, gait, station, and turns were observed as normal.   Data reviewed: I personally reviewed the images and agree with the radiology interpretations.  Ct Head Wo Contrast 03/24/2016 1. No acute finding.  2. 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 No brainstem or cerebellar mass effect to suggest interval enlargement.  3. Stable small sellar and suprasellar mass. Patient is status post trans-sphenoidal pituitary surgery.   Mr Virgel Paling Wo Contrast 03/24/2016 Within limits for assessment on this motion degraded exam, no acute stroke is evident. Mild atrophy and small vessel disease. RIGHT CP angle meningioma, dorsal dural clival thickening, and dorsal intrasellar subcentimeter T1 hyperintense mass lesion, all stable from 2015; multiple contiguous meningiomas would be one unifying hypothesis for this appearance. Alternately, the sellar lesion could be unrelated, possible Rathke's cleft cyst. 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 Diffuse intracranial atherosclerotic disease, likely sequelae of diabetes, without proximal large vessel occlusion or flow-limiting intracranial stenosis.   CUS - Findings are consistent with a 1-39 percent stenosis involving the right internal carotid artery and the left internal carotid artery. The vertebral arteries demonstrate antegrade flow.  TTE 06/26/17 - Left ventricle: The cavity size was normal. Systolic function was   mildly to moderately reduced. The estimated ejection fraction was   in the range of 40% to 45%.  Hypokinesis of the   entireinferolateral myocardium; consistent with ischemia in the   distribution of the left circumflex coronary artery. Left   ventricular diastolic function parameters were normal. - Mitral valve: There was mild regurgitation.  Component     Latest Ref Rng & Units 03/01/2017 06/11/2017 06/25/2017 06/26/2017  Cholesterol     0 - 200 mg/dL  222 (H) 104 113  Triglycerides     0.0 - 149.0 mg/dL  88.0 92 109  HDL Cholesterol     >39.00 mg/dL  45.80 36 (L) 37 (L)  VLDL     0.0 - 40.0 mg/dL  17.6 18 22   LDL (calc)     0 - 99 mg/dL  159 (H) 50 54  Total CHOL/HDL Ratio       5 2.9 3.1  NonHDL       176.19    Hemoglobin A1C     4.6 - 6.5 % 8.4 (H) 8.2 (H) 7.5 (H)   Mean Plasma Glucose     mg/dL   168.55   HIV Screen 4th Generation wRfx     Non Reactive    Non Reactive   Component     Latest Ref Rng & Units 08/03/2017  Cholesterol     0 - 200 mg/dL 142  Triglycerides     0.0 - 149.0 mg/dL 55.0  HDL Cholesterol     >39.00 mg/dL 49.00  VLDL     0.0 - 40.0 mg/dL 11.0  LDL (calc)  0 - 99 mg/dL 82  Total CHOL/HDL Ratio      3  NonHDL      92.59  Hemoglobin A1C     4.6 - 6.5 % 7.9 (H)  Mean Plasma Glucose     mg/dL   HIV Screen 4th Generation wRfx     Non Reactive     Assessment: As you may recall, she is a 62 y.o. African American female with PMH of hypertension, hyperlipidemia, diabetes, OSA on CPAP, breast cancer s/p lumpectomy and radiation, and right CP angle angioma s/p surgery 2005 was admitted in 03/2016 for transient left-sided numbness weakness lasted half a day. CT and MRI showed no acute stroke.  MRA negative for LVO but diffuse atherosclerosis.  Carotid Doppler unremarkable.  TTE EF 60-65%.  LDL 61 and A1c 9.1.  Patient diagnosed with TIA.  Had MI on 06/25/17 s/p cardiac stent placement. She was on aspirin and Brilinta since.  Continue pravastatin 20. Complained of short memory deficit, however MOCA 29/30. Recent LDL 82 and A1c 7.9.  Plan:  -  Continue take ASA and brilinta for heart and stroke prevention - continue the pravastatin for heart and stroke prevention - continue follow up with [facility] in  excellent score and no concerns at this time - Follow up with your primary care physician for stroke risk factor modification. Recommend maintain blood pressure goal <130/80, diabetes with hemoglobin A1c goal below 7.0% and lipids with LDL cholesterol goal below 70 mg/dL.  - check BP and glucose at home and record - follow up with oncologist as scheduled.  - diabetic diet and regular exercise - follow up as needed.   No orders of the defined types were placed in this encounter.   No orders of the defined types were placed in this encounter.   Patient Instructions  - Continue take ASA and brilinta for heart and stroke prevention - continue the pravastatin for heart and stroke prevention - continue follow up with [facility] in [place] - your memory test was excellent and no concerns at this time - Follow up with your primary care physician for stroke risk factor modification. Recommend maintain blood pressure goal <130/80, diabetes with hemoglobin A1c goal below 7.0% and lipids with LDL cholesterol goal below 70 mg/dL.  - check BP and glucose at home and record - follow up with oncologist as scheduled.  - diabetic diet and regular exercise - follow up as needed.    Rosalin Hawking, MD PhD [facility] [address] [place], [place] [address] [phone]

## 2017-08-07 NOTE — Progress Notes (Addendum)
Patient ID: Destiny Beasley, female   DOB: 1955-09-14, 62 y.o.   MRN: [id number]   Reason for Appointment: Type II Diabetes follow-up   History of Present Illness    Date of diagnosis: 10/2008  Previous history: She had markedly increased blood sugars at diagnosis and was tried on oral hypoglycemic drugs and Victoza for about 9 months before starting an insulin.  Since 2011 she had been on basal bolus insulin regimen Previously had had difficulty controlling her diabetes mostly because of difficulty with compliance with various aspects of self-care.  Previous regimen: Toujeo 60 units at 8 am . Apidra 20 units at times  She was started on the Omnipod insulin pump on 09/11/16  Recent history:   Insulin regimen: Currently Tresiba 70 units once a day with mealtime coverage 1: 10  Omnipod insulin pump settings currently: Basal rate midnight = 1.0, 8 PM = 1.2, 3 PM = 1.4 and 10 PM = 1.15  Carbohydrate coverage 1:8 and active insulin time 4 hours with target 100 Sensitivity 1: 15  Non-insulin hypoglycemic drugs: Victoza 1.8 mg daily occ  Her A1c has been consistently high and is now 7.9 although has had this done 3 times in the last 3 months, has been as high as 10.4 last year         Current management, blood sugar patterns and problems:  She now says that she is having difficulty getting the supplies of her OmniPod pump to last a month and has not been using this  Who not clear when she started using the basal bolus injection regimen but her blood sugars are starting to be higher since last Sunday  She was using the LIBRE at the end of March and only improved  With this her blood sugars were averaging 155 but showing considerable variability overnight and in the evenings after 5 PM  She said that she was doing poorly on her diet until recently with making poor choices with carbohydrates, portions and high-fat foods  More recently her blood sugars are generally fairly good  except for sporadic high readings including in the morning  Also has until recently sporadic low sugars and difficult to get a better  AVERAGE blood sugar from her recent blood sugar meter download is 135 Has not started exercise as yet  Side effects from medications: Candidiasis from Invokana  Glucose readings from download as above on both devices   Exercise: Going to start cardiac rehab    Dietician visit: Most recent: 04/2010 Weight control:  Wt Readings from Last 3 Encounters:  08/08/17 241 lb 9.6 oz (109.6 kg)  08/07/17 240 lb 3.2 oz (109 kg)  07/23/17 235 lb (106.6 kg)         Diabetes labs:  Lab Results  Component Value Date   HGBA1C 7.9 (H) 08/03/2017   HGBA1C 7.5 (H) 06/25/2017   HGBA1C 8.2 (H) 06/11/2017   Lab Results  Component Value Date   MICROALBUR <0.7 03/01/2017   [place] 82 08/03/2017   CREATININE 1.05 08/03/2017      No visits with results within 1 Day(s) from this visit.  Latest known visit with results is:  Lab on 08/03/2017  Component Date Value Ref Range Status  . Cholesterol 08/03/2017 142  0 - 200 mg/dL Final   ATP III Classification       Desirable:  < 200 mg/dL               Borderline High:  200 -  239 mg/dL          High:  > = 240 mg/dL  . Triglycerides 08/03/2017 55.0  0.0 - 149.0 mg/dL Final   Normal:  <150 mg/dLBorderline High:  150 - 199 mg/dL  . HDL 08/03/2017 49.00  >39.00 mg/dL Final  . VLDL 08/03/2017 11.0  0.0 - 40.0 mg/dL Final  . LDL Cholesterol 08/03/2017 82  0 - 99 mg/dL Final  . Total CHOL/HDL Ratio 08/03/2017 3   Final                  Men          Women1/2 Average Risk     3.4          3.3Average Risk          5.0          4.42X Average Risk          9.6          7.13X Average Risk          15.0          11.0                      . NonHDL 08/03/2017 92.59   Final   NOTE:  Non-HDL goal should be 30 mg/dL higher than patient's LDL goal (i.e. LDL goal of < 70 mg/dL, would have non-HDL goal of < 100 mg/dL)  . Sodium  08/03/2017 142  135 - 145 mEq/L Final  . Potassium 08/03/2017 4.0  3.5 - 5.1 mEq/L Final  . Chloride 08/03/2017 103  96 - 112 mEq/L Final  . CO2 08/03/2017 30  19 - 32 mEq/L Final  . Glucose, Bld 08/03/2017 110* 70 - 99 mg/dL Final  . BUN 08/03/2017 18  6 - 23 mg/dL Final  . Creatinine, Ser 08/03/2017 1.05  0.40 - 1.20 mg/dL Final  . Total Bilirubin 08/03/2017 0.3  0.2 - 1.2 mg/dL Final  . Alkaline Phosphatase 08/03/2017 104  39 - 117 U/L Final  . AST 08/03/2017 19  0 - 37 U/L Final  . ALT 08/03/2017 21  0 - 35 U/L Final  . Total Protein 08/03/2017 8.1  6.0 - 8.3 g/dL Final  . Albumin 08/03/2017 4.4  3.5 - 5.2 g/dL Final  . Calcium 08/03/2017 9.9  8.4 - 10.5 mg/dL Final  . GFR 08/03/2017 68.86  >60.00 mL/min Final  . Hgb A1c MFr Bld 08/03/2017 7.9* 4.6 - 6.5 % Final   Glycemic Control Guidelines for People with Diabetes:Non Diabetic:  <6%Goal of Therapy: <7%Additional Action Suggested:  >8%      Allergies as of 08/08/2017      Reactions   Hydrocodone Other (See Comments)   Severe anxiety Confusion   Invokana [canagliflozin] Other (See Comments)   Constant yeast infections   Atorvastatin Other (See Comments)   DIZZINESS      Medication List        Accurate as of 08/08/17  1:48 PM. Always use your most recent med list.          aspirin 81 MG EC tablet Take 1 tablet (81 mg total) by mouth daily.   Biotin 10000 MCG Tabs Take by mouth.   Coenzyme Q-10 100 MG capsule Take 100 mg by mouth daily.   DULERA 100-5 MCG/ACT Aero Generic drug:  mometasone-formoterol Inhale 2 puffs into the lungs 2 (two) times daily as needed for wheezing or shortness of breath (allergies).  furosemide 20 MG tablet Commonly known as:  LASIX Take 1 tablet (20 mg total) by mouth daily.   Insulin Degludec 200 UNIT/ML Sopn Commonly known as:  TRESIBA FLEXTOUCH Inject 70 Units into the skin daily.   insulin lispro 100 UNIT/ML injection Commonly known as:  HUMALOG Inject 10 Units into the skin  3 (three) times daily before meals.   irbesartan 75 MG tablet Commonly known as:  AVAPRO Take 1 tablet (75 mg total) by mouth daily.   liraglutide 18 MG/3ML Sopn Commonly known as:  VICTOZA ADMINISTER 1.8 MG UNDER THE SKIN DAILY   metoprolol tartrate 25 MG tablet Commonly known as:  LOPRESSOR Take 1.5 tablets (37.5 mg total) by mouth 2 (two) times daily.   MULTIPLE VITAMIN PO Take 1 tablet daily by mouth.   nitroGLYCERIN 0.4 MG SL tablet Commonly known as:  NITROSTAT Place 1 tablet (0.4 mg total) under the tongue every 5 (five) minutes as needed for chest pain (up to 3 doses. If taking 3rd dose call 911).   pravastatin 20 MG tablet Commonly known as:  PRAVACHOL   ticagrelor 90 MG Tabs tablet Commonly known as:  BRILINTA Take 1 tablet (90 mg total) by mouth 2 (two) times daily.   venlafaxine XR 150 MG 24 hr capsule Commonly known as:  EFFEXOR-XR Take 150 mg by mouth daily.   VITAMIN D-3 PO Take by mouth.       Allergies:  Allergies  Allergen Reactions  . Hydrocodone Other (See Comments)    Severe anxiety Confusion   . Invokana [Canagliflozin] Other (See Comments)    Constant yeast infections  . Atorvastatin Other (See Comments)    DIZZINESS    Past Medical History:  Diagnosis Date  . Anemia   . Anxiety   . CAD in native artery    a. Inf STEMI 10/8293 complicated by cardiogenic shock/complete heart block s/p emergent cath showing culprit large dominant LCx s/p aspiration thrombectomy and DES, EF 50% by cath, 40-45% by echo.  . Complete heart block, transient ([place])    a. 06/2017 in setting of acute inf MI -> resolved after PCI.  . Diabetes mellitus   . Goiter   . History of radiation therapy 06/14/2016 - 07/19/2016   Right Breast 50 Gy 25 fractions  . Hypercholesteremia   . Hypertension   . Ischemic cardiomyopathy    a. EF 50% by cath and 40-45% by echo 06/2017.  . Malignant neoplasm of upper-outer quadrant of right female breast ([place]) 02/24/2016  .  Meningioma ([place])   . Obesity   . Personal history of chemotherapy   . Personal history of radiation therapy   . Pituitary tumor   . Seasonal asthma   . Sleep apnea    does not use every night  . Stroke [facility])    TIA - Dec 1 st, 2017    Past Surgical History:  Procedure Laterality Date  . ABDOMINAL HYSTERECTOMY    . BRAIN MENINGIOMA EXCISION  2005  . BREAST BIOPSY    . BREAST LUMPECTOMY Right    2017  . BREAST LUMPECTOMY WITH RADIOACTIVE SEED LOCALIZATION Right 04/20/2016   Procedure: RIGHT BREAST LUMPECTOMY WITH RADIOACTIVE SEED LOCALIZATION;  Surgeon: Fanny Skates, MD;  Location: [place];  Service: General;  Laterality: Right;  . BREAST REDUCTION SURGERY Bilateral 10/23/2016   Procedure: BILATERAL BREAST REDUCTION WITH LIPOSUCTION ASSISTANCE;  Surgeon: Cristine Polio, MD;  Location: [place];  Service: Plastics;  Laterality: Bilateral;  . CESAREAN  SECTION    . CORONARY STENT INTERVENTION N/A 06/25/2017   Procedure: CORONARY STENT INTERVENTION;  Surgeon: Sherren Mocha, MD;  Location: [place] CV [facility];  Service: Cardiovascular;  Laterality: N/A;  . CORONARY/GRAFT ACUTE MI REVASCULARIZATION N/A 06/25/2017   Procedure: Coronary/Graft Acute MI Revascularization;  Surgeon: Sherren Mocha, MD;  Location: [place] CV [facility];  Service: Cardiovascular;  Laterality: N/A;  . FOOT SURGERY Bilateral 2016   hammer toe surgery  . LEFT HEART CATH AND CORONARY ANGIOGRAPHY N/A 06/25/2017   Procedure: LEFT HEART CATH AND CORONARY ANGIOGRAPHY;  Surgeon: Sherren Mocha, MD;  Location: [place] [facility];  Service: Cardiovascular;  Laterality: N/A;  . MENISCUS REPAIR Left 2015  . PITUITARY SURGERY    . REDUCTION MAMMAPLASTY    . RIGHT HEART CATH N/A 06/25/2017   Procedure: RIGHT HEART CATH;  Surgeon: Sherren Mocha, MD;  Location: [place] [facility];  Service: Cardiovascular;  Laterality: N/A;  . THYROID SURGERY      Family History  Problem Relation Age of Onset   . Diabetes Mother   . Hyperlipidemia Mother   . Hypertension Mother     Social History:  reports that she has never smoked. She has never used smokeless tobacco. She reports that she drinks about 0.6 oz of alcohol per week. She reports that she does not use drugs.  Review of Systems:  Hypertension:   is taking much less medication since her MI, Blood pressure is managed by cardiologist  Lipids:  inadequate control of LDL, normal triglycerides   she previously had been on Crestor and had uncertain side effects She thinks she has reacted to all her statin drug prescription before but is trying to take 20 mg pravastatin now  He is not always been consistent with her diet with fat restriction  Lab Results  Component Value Date   CHOL 142 08/03/2017   HDL 49.00 08/03/2017   LDLCALC 82 08/03/2017   TRIG 55.0 08/03/2017   CHOLHDL 3 08/03/2017   Lab Results  Component Value Date   ALT 21 08/03/2017    Diabetic shoe prescription has been given previously No recent complaints of numbness   Examination:   BP 124/80 (BP Location: Left Arm, Patient Position: Sitting, Cuff Size: Normal)   Pulse 84   Ht 6' (1.829 m)   Wt 241 lb 9.6 oz (109.6 kg)   SpO2 99%   BMI 32.77 kg/m   Body mass index is 32.77 kg/m.   Diabetic Foot Exam - Simple   Simple Foot Form Diabetic Foot exam was performed with the following findings:  Yes   Visual Inspection No deformities, no ulcerations, no other skin breakdown bilaterally:  Yes Sensation Testing Intact to touch and monofilament testing bilaterally:  Yes Pulse Check Posterior Tibialis and Dorsalis pulse intact bilaterally:  Yes Comments      ASSESSMENT/ PLAN:    Diabetes type 2 with obesity See history of present illness for detailed discussion of current management, blood sugar patterns and problems identified  Although her blood sugars overall are looking fairly good there are inconsistent Only more recently with her trying to  improve her diet with cutting back on carbohydrates, portions and high fat foods for meals and evening snacks her blood sugars look a little better However with her not using the pump he has some erratic blood sugars including in the morning recently She did not start the pump recently because of running out of the pump supplies from changing the pump every 2-2-1/2  days instead of 3  Recommendations today:   She needs to watch her diet better She will start using Humalog U-200 when she gets her pump supply Have given her the settings for the new insulin and she will need to make sure she changes her carbohydrate and correction factors as discussed today Discussed how to adjust the settings because she will be getting twice as much insulin and the same volume now She will also continue to use the freestyle Libre sensor Discussed that if her fasting readings are consistently higher will need to adjust her basal rates overnight Continue Victoza Reminded her to use a temporary basal when she starts her cardiac rehab program She was also reminded to get the new controller for her insulin pump which will help her with having access to carbohydrate information on the device  Discussed that foot exam does not show neuropathy and loss of sensation and may or may not qualify for custom made shoes  HYPERTENSION: Fair control.  She will continue to follow-up with PCP  LIPIDS: Since she does not think she can take Crestor or any other statin has been given information on Repatha, most likely she needs aggressive management rather than a low dose low intensity statin like pravastatin  Counseling time on subjects discussed in assessment and plan sections is over 50% of today's 25 minute visit    Patient Instructions  Get the [employer]!  Elayne Snare 08/08/2017, 1:48 PM   Counseling time on subjects discussed in assessment and plan sections is over 50% of today's 25 minute visit

## 2017-08-08 ENCOUNTER — Encounter: Payer: Self-pay | Admitting: Endocrinology

## 2017-08-08 ENCOUNTER — Ambulatory Visit (INDEPENDENT_AMBULATORY_CARE_PROVIDER_SITE_OTHER): Payer: 59 | Admitting: Endocrinology

## 2017-08-08 ENCOUNTER — Encounter (INDEPENDENT_AMBULATORY_CARE_PROVIDER_SITE_OTHER): Payer: Self-pay

## 2017-08-08 VITALS — BP 124/80 | HR 84 | Ht 72.0 in | Wt 241.6 lb

## 2017-08-08 DIAGNOSIS — C50919 Malignant neoplasm of unspecified site of unspecified female breast: Secondary | ICD-10-CM | POA: Insufficient documentation

## 2017-08-08 DIAGNOSIS — E1165 Type 2 diabetes mellitus with hyperglycemia: Secondary | ICD-10-CM | POA: Diagnosis not present

## 2017-08-08 DIAGNOSIS — Z794 Long term (current) use of insulin: Secondary | ICD-10-CM

## 2017-08-08 DIAGNOSIS — Z8673 Personal history of transient ischemic attack (TIA), and cerebral infarction without residual deficits: Secondary | ICD-10-CM | POA: Insufficient documentation

## 2017-08-08 NOTE — Patient Instructions (Signed)
Get the [employer]!

## 2017-08-08 NOTE — Progress Notes (Signed)
Cardiology Office Note    Date:  08/09/2017   ID:  Destiny Beasley, DOB 08/22/1955, MRN [id number]  PCP:  Lawerance Cruel, MD  Cardiologist:  Dr. Burt Knack  Chief Complaint: weight gain   History of Present Illness:   Destiny Beasley is a 62 y.o. female with hx of CAD, chronic systolic CHF,  DM, prior TIA, OSA, breast cancer, hypertension and HLD presents for weight gain.   She was admitted 3/4-3/8 with an acute inferolateral ST elevation myocardial infarction complicated by cardiogenic shock, acute systolic heart failure and complete heart block.  Emergent cardiac catheterization demonstrated an occluded proximal left circumflex which was treated with drug-eluting stent.  Echocardiogram demonstrated EF 40-45%.  She did require dopamine post procedure.  This was eventually weaned off and her heart rate improved.  She was placed on beta-blocker, ARB, dual antiplatelet therapy and high intensity statin.  Recently complained of weight gain and lasix added. Here today for follow up.  The patient was taking Lasix 20 mg twice a day since last office visit.  Feeling out of her medication and then noted gradual weight gain with abdominal tightness, dyspnea and orthopnea.  Improved with starting Lasix QD Last week however she took twice daily dose.  She ran out of Lasix again 2 days ago and noted 3-4 pound weight gain with orthopnea, abdominal tightness and dyspnea.  Blood pressure management elevated today however it was in 120s yesterday.  He denies any exertional chest pressure.  No palpitation, lower extremity edema, syncope or dizziness.  Compliant with her medication.  Past Medical History:  Diagnosis Date  . Anemia   . Anxiety   . CAD in native artery    a. Inf STEMI 08/7844 complicated by cardiogenic shock/complete heart block s/p emergent cath showing culprit large dominant LCx s/p aspiration thrombectomy and DES, EF 50% by cath, 40-45% by echo.  . Complete heart block, transient ([place])    a.  06/2017 in setting of acute inf MI -> resolved after PCI.  . Diabetes mellitus   . Goiter   . History of radiation therapy 06/14/2016 - 07/19/2016   Right Breast 50 Gy 25 fractions  . Hypercholesteremia   . Hypertension   . Ischemic cardiomyopathy    a. EF 50% by cath and 40-45% by echo 06/2017.  . Malignant neoplasm of upper-outer quadrant of right female breast ([place]) 02/24/2016  . Meningioma ([place])   . Obesity   . Personal history of chemotherapy   . Personal history of radiation therapy   . Pituitary tumor   . Seasonal asthma   . Sleep apnea    does not use every night  . Stroke [facility])    TIA - Dec 1 st, 2017    Past Surgical History:  Procedure Laterality Date  . ABDOMINAL HYSTERECTOMY    . BRAIN MENINGIOMA EXCISION  2005  . BREAST BIOPSY    . BREAST LUMPECTOMY Right    2017  . BREAST LUMPECTOMY WITH RADIOACTIVE SEED LOCALIZATION Right 04/20/2016   Procedure: RIGHT BREAST LUMPECTOMY WITH RADIOACTIVE SEED LOCALIZATION;  Surgeon: Fanny Skates, MD;  Location: [place];  Service: General;  Laterality: Right;  . BREAST REDUCTION SURGERY Bilateral 10/23/2016   Procedure: BILATERAL BREAST REDUCTION WITH LIPOSUCTION ASSISTANCE;  Surgeon: Cristine Polio, MD;  Location: [place];  Service: Plastics;  Laterality: Bilateral;  . CESAREAN SECTION    . CORONARY STENT INTERVENTION N/A 06/25/2017   Procedure: CORONARY STENT INTERVENTION;  Surgeon: Burt Knack,  Legrand Como, MD;  Location: [place] [facility];  Service: Cardiovascular;  Laterality: N/A;  . CORONARY/GRAFT ACUTE MI REVASCULARIZATION N/A 06/25/2017   Procedure: Coronary/Graft Acute MI Revascularization;  Surgeon: Sherren Mocha, MD;  Location: [place] [facility];  Service: Cardiovascular;  Laterality: N/A;  . FOOT SURGERY Bilateral 2016   hammer toe surgery  . LEFT HEART CATH AND CORONARY ANGIOGRAPHY N/A 06/25/2017   Procedure: LEFT HEART CATH AND CORONARY ANGIOGRAPHY;  Surgeon: Sherren Mocha, MD;  Location:  [place] [facility];  Service: Cardiovascular;  Laterality: N/A;  . MENISCUS REPAIR Left 2015  . PITUITARY SURGERY    . REDUCTION MAMMAPLASTY    . RIGHT HEART CATH N/A 06/25/2017   Procedure: RIGHT HEART CATH;  Surgeon: Sherren Mocha, MD;  Location: [place] [facility];  Service: Cardiovascular;  Laterality: N/A;  . THYROID SURGERY      Current Medications: Prior to Admission medications   Medication Sig Start Date End Date Taking? Authorizing Provider  aspirin EC 81 MG EC tablet Take 1 tablet (81 mg total) by mouth daily. 06/30/17   Dunn, Nedra Hai, PA-C  Biotin 10000 MCG TABS Take by mouth.    [provider]  Cholecalciferol (VITAMIN D-3 PO) Take by mouth.    [provider]  Coenzyme Q-10 100 MG capsule Take 100 mg by mouth daily.    [provider]  DULERA 100-5 MCG/ACT AERO Inhale 2 puffs into the lungs 2 (two) times daily as needed for wheezing or shortness of breath (allergies).  09/03/13   [provider]  furosemide (LASIX) 20 MG tablet Take 1 tablet (20 mg total) by mouth daily. 07/30/17 10/28/17  End, Harrell Gave, MD  Insulin Degludec (TRESIBA FLEXTOUCH) 200 UNIT/ML SOPN Inject 70 Units into the skin daily. 07/24/17   Elayne Snare, MD  insulin lispro (HUMALOG) 100 UNIT/ML injection Inject 10 Units into the skin 3 (three) times daily before meals.    [provider]  irbesartan (AVAPRO) 75 MG tablet Take 1 tablet (75 mg total) by mouth daily. 06/29/17   Dunn, Nedra Hai, PA-C  liraglutide (VICTOZA) 18 MG/3ML SOPN ADMINISTER 1.8 MG UNDER THE SKIN DAILY 06/13/17   Elayne Snare, MD  Metoprolol Tartrate (LOPRESSOR) 25 MG tablet Take 1.5 tablets (37.5 mg total) by mouth 2 (two) times daily. 06/29/17   Dunn, Nedra Hai, PA-C  MULTIPLE VITAMIN PO Take 1 tablet daily by mouth.    [provider]  nitroGLYCERIN (NITROSTAT) 0.4 MG SL tablet Place 1 tablet (0.4 mg total) under the tongue every 5 (five) minutes as needed for chest pain (up to 3 doses. If taking 3rd  dose call 911). 06/29/17   Charlie Pitter, PA-C  pravastatin (PRAVACHOL) 20 MG tablet  07/31/17   [provider]  ticagrelor (BRILINTA) 90 MG TABS tablet Take 1 tablet (90 mg total) by mouth 2 (two) times daily. 06/29/17   Dunn, Nedra Hai, PA-C  venlafaxine (EFFEXOR-XR) 150 MG 24 hr capsule Take 150 mg by mouth daily.     [provider]    Allergies:   Hydrocodone; Invokana [canagliflozin]; and Atorvastatin   Social History   Socioeconomic History  . Marital status: Single    Spouse name: Not on file  . Number of children: Not on file  . Years of education: Not on file  . Highest education level: Not on file  Occupational History  . Not on file  Social Needs  . Financial resource strain: Not on file  . Food insecurity:  Worry: Not on file    Inability: Not on file  . Transportation needs:    Medical: Not on file    Non-medical: Not on file  Tobacco Use  . Smoking status: Never Smoker  . Smokeless tobacco: Never Used  Substance and Sexual Activity  . Alcohol use: Yes    Alcohol/week: 0.6 oz    Types: 1 Glasses of wine per week    Comment: 1 glass monthly  . Drug use: No  . Sexual activity: Not on file    Comment: no cycles; pt had hyst  Lifestyle  . Physical activity:    Days per week: Not on file    Minutes per session: Not on file  . Stress: Not on file  Relationships  . Social connections:    Talks on phone: Not on file    Gets together: Not on file    Attends religious service: Not on file    Active member of club or organization: Not on file    Attends meetings of clubs or organizations: Not on file    Relationship status: Not on file  Other Topics Concern  . Not on file  Social History Narrative  . Not on file     Family History:  The patient's family history includes Diabetes in her mother; Hyperlipidemia in her mother; Hypertension in her mother.   ROS:   Please see the history of present illness.    ROS All other systems reviewed and  are negative.   PHYSICAL EXAM:   VS:  BP (!) 141/84   Pulse 88   Ht 6' (1.829 m)   Wt 242 lb (109.8 kg)   SpO2 (!) 88%   BMI 32.82 kg/m    GEN: Well nourished, well developed, in no acute distress  HEENT: normal  Neck: no carotid bruits, or masses.  Elevated JVD Cardiac: RRR; no murmurs, rubs, or gallops,no edema  Respiratory:  clear to auscultation bilaterally, normal work of breathing GI: soft, nontender, distended, + BS MS: no deformity or atrophy  Skin: warm and dry, no rash Neuro:  Alert and Oriented x 3, Strength and sensation are intact Psych: euthymic mood, full affect  Wt Readings from Last 3 Encounters:  08/09/17 242 lb (109.8 kg)  08/08/17 241 lb 9.6 oz (109.6 kg)  08/07/17 240 lb 3.2 oz (109 kg)      Studies/Labs Reviewed:   EKG:  EKG is not ordered today.    Recent Labs: 06/25/2017: B Natriuretic Peptide 58.0 07/06/2017: Hemoglobin 12.8; Platelets 560 08/03/2017: ALT 21; BUN 18; Creatinine, Ser 1.05; Potassium 4.0; Sodium 142   Lipid Panel    Component Value Date/Time   CHOL 142 08/03/2017 0823   TRIG 55.0 08/03/2017 0823   HDL 49.00 08/03/2017 0823   CHOLHDL 3 08/03/2017 0823   VLDL 11.0 08/03/2017 0823   LDLCALC 82 08/03/2017 0823    Additional studies/ records that were reviewed today include:   Echocardiogram: 06/26/17 Study Conclusions  - Left ventricle: The cavity size was normal. Systolic function was   mildly to moderately reduced. The estimated ejection fraction was   in the range of 40% to 45%. Hypokinesis of the   entireinferolateral myocardium; consistent with ischemia in the   distribution of the left circumflex coronary artery. Left   ventricular diastolic function parameters were normal. - Mitral valve: There was mild regurgitation.   Coronary/Graft Acute MI Revascularization   06/25/17  CORONARY STENT INTERVENTION  LEFT HEART CATH AND CORONARY ANGIOGRAPHY  RIGHT HEART CATH  Conclusion     There is mild to moderate left  ventricular systolic dysfunction.  LV end diastolic pressure is moderately elevated.  The left ventricular ejection fraction is 45-50% by visual estimate.  Prox LAD to Mid LAD lesion is 30% stenosed.  Prox Cx lesion is 100% stenosed.  A drug-eluting stent was successfully placed using a STENT SYNERGY DES 4X20.  Post intervention, there is a 0% residual stenosis.   1.  Acute inferolateral STEMI involving a large, dominant left circumflex complicated by cardiogenic shock and complete heart block 2.  Successful PCI of the left circumflex using aspiration thrombectomy and drug-eluting stent implantation 3.  Mild nonobstructive disease in the left main, LAD, and nondominant RCA without significant stenosis in any of those vessels 4.  Mild segmental contraction abnormality of the left ventricle consistent with a left circumflex/inferior infarct, with mild segmental LV systolic dysfunction, LVEF estimated at 50% 5.  Acute systolic heart failure with elevated LVEDP and mildly elevated right heart pressures  Recommendations: The patient will be transferred to the cardiac ICU for continued post MI care.  We will continue Cangrelor for 2 hours.  She is loaded with Brilinta 180 mg at the completion of the procedure.  Post MI medical therapy will be instituted.  We will wean her off of dopamine as tolerated.        ASSESSMENT & PLAN:    1. CAD s/p DES to LCX -No angina.  Continue aspirin, Brilinta, statin and beta-blocker.  2.  Acute on  chronic systolic CHF -She feels better with stable weight around 233lb when she takes Lasix 20 mg twice daily.  Kidney function and electrolytes were normal yesterday. Compliant with low-sodium diet.  Her echo last months showed LV function of 40-45%. -She is volume overloaded by exam.  She has elevated JVD and significant abdominal tightness.  However, no lower extremity edema.  For right now, I will continue Lasix 20 mg twice a day.  She will bring her  daily weight during next office visit in 2 weeks. -After stabilizing her volume status, consider adding Spironolactone VS Entresto.  May need to repeat echocardiogram to see her LV function or maximize heart failure therapy first. - Continue BB and ARB at current dose.   3. HLD - She notes intolerance to atorvastatin.  She has been dizzy after taking her dose each day.  She has had issues with other statins in the past. Tolerating Rosuvastatin 20mg  qd. LFT and Lipid panel as schedule.   4. HTN -Minimally elevated today.  Advised to keep a log of her blood pressure and bring during next office visit.  May consider up titration of beta-blocker versus addition of spironolactone/Entrestro as above.  5. OSA - pending sleep study   6. DM -Managed by PCP.  Temporary handicap parking for 106-month given for dyspnea on exertion.  Likely her exacerbation is due to volume overload rather angina. Goal is to maximize heart failure therapy.  We will discuss further during next office visit when Dr. Burt Knack in clinic.    Medication Adjustments/Labs and Tests Ordered: Current medicines are reviewed at length with the patient today.  Concerns regarding medicines are outlined above.  Medication changes, Labs and Tests ordered today are listed in the Patient Instructions below. Patient Instructions  Medication Instructions:  Your physician recommends that you continue on your current medications as directed. Please refer to the Current Medication list given to you today.   Labwork: None  Testing/Procedures:  None  Follow-Up: Keep follow up with Richardson Dopp, PA-C on 5/10  Any Other Special Instructions Will Be Listed Below (If Applicable).     If you need a refill on your cardiac medications before your next appointment, please call your pharmacy.      Jarrett Soho, [place]  08/09/2017 9:19 AM    [facility] [place], [place], [place]  [address] Phone:  [phone]; Fax: [phone]

## 2017-08-09 ENCOUNTER — Ambulatory Visit (INDEPENDENT_AMBULATORY_CARE_PROVIDER_SITE_OTHER): Payer: 59 | Admitting: Physician Assistant

## 2017-08-09 ENCOUNTER — Encounter: Payer: Self-pay | Admitting: Physician Assistant

## 2017-08-09 VITALS — BP 141/84 | HR 88 | Ht 72.0 in | Wt 242.0 lb

## 2017-08-09 DIAGNOSIS — I5023 Acute on chronic systolic (congestive) heart failure: Secondary | ICD-10-CM | POA: Diagnosis not present

## 2017-08-09 DIAGNOSIS — G4733 Obstructive sleep apnea (adult) (pediatric): Secondary | ICD-10-CM | POA: Diagnosis not present

## 2017-08-09 DIAGNOSIS — I10 Essential (primary) hypertension: Secondary | ICD-10-CM

## 2017-08-09 DIAGNOSIS — E119 Type 2 diabetes mellitus without complications: Secondary | ICD-10-CM | POA: Diagnosis not present

## 2017-08-09 DIAGNOSIS — E785 Hyperlipidemia, unspecified: Secondary | ICD-10-CM

## 2017-08-09 DIAGNOSIS — Z794 Long term (current) use of insulin: Secondary | ICD-10-CM | POA: Diagnosis not present

## 2017-08-09 DIAGNOSIS — I2510 Atherosclerotic heart disease of native coronary artery without angina pectoris: Secondary | ICD-10-CM

## 2017-08-09 MED ORDER — FUROSEMIDE 20 MG PO TABS: 20.0000 mg | ORAL_TABLET | Freq: Two times a day (BID) | ORAL | 1 refills | Status: DC

## 2017-08-09 NOTE — Patient Instructions (Signed)
Medication Instructions:  Your physician recommends that you continue on your current medications as directed. Please refer to the Current Medication list given to you today.   Labwork: None  Testing/Procedures: None  Follow-Up: Keep follow up with Richardson Dopp, PA-C on 5/10  Any Other Special Instructions Will Be Listed Below (If Applicable).     If you need a refill on your cardiac medications before your next appointment, please call your pharmacy.

## 2017-08-10 ENCOUNTER — Encounter: Payer: Self-pay | Admitting: Endocrinology

## 2017-08-13 ENCOUNTER — Other Ambulatory Visit: Payer: Self-pay

## 2017-08-13 ENCOUNTER — Telehealth: Payer: Self-pay | Admitting: Endocrinology

## 2017-08-13 MED ORDER — INSULIN PEN NEEDLE 31G X 5 MM MISC: 0 refills | Status: DC

## 2017-08-13 MED ORDER — INSULIN DEGLUDEC 200 UNIT/ML ~~LOC~~ SOPN: 70.0000 [IU] | PEN_INJECTOR | Freq: Every day | SUBCUTANEOUS | 0 refills | Status: DC

## 2017-08-13 MED ORDER — INSULIN LISPRO 100 UNIT/ML ~~LOC~~ SOLN: 10.0000 [IU] | Freq: Three times a day (TID) | SUBCUTANEOUS | 3 refills | Status: DC

## 2017-08-13 NOTE — Telephone Encounter (Signed)
Hian from [facility] calling needing clarification on insulin Humalog  Please advise

## 2017-08-13 NOTE — Telephone Encounter (Signed)
Pharmacy was called and they stated they are unsure of the clarification needed.

## 2017-08-14 ENCOUNTER — Telehealth: Payer: Self-pay | Admitting: Endocrinology

## 2017-08-14 NOTE — Telephone Encounter (Signed)
insulin lispro (HUMALOG) 100 UNIT/ML injection   [facility] is calling to verify the insulin They have two different prescriptions sent and both have different dosage  and they are just needing to verify  They have recieved 200 units kwik pens 100 units vials.   Please call this number to clarify.   [phone]

## 2017-08-15 ENCOUNTER — Other Ambulatory Visit: Payer: Self-pay | Admitting: Cardiovascular Disease

## 2017-08-15 DIAGNOSIS — G4733 Obstructive sleep apnea (adult) (pediatric): Secondary | ICD-10-CM

## 2017-08-15 NOTE — Procedures (Signed)
Patient Name: Destiny Beasley, Destiny Beasley Date: 07/23/2017 Gender: Female D.O.B: [birth year] Age (years): 53 Referring Provider: Shelva Majestic MD, ABSM Height (inches): 72 Interpreting Physician: Shelva Majestic MD, ABSM Weight (lbs): 235 RPSGT: Jorge Ny BMI: 32 MRN: [id number] Neck Size: 16.00  CLINICAL INFORMATION Sleep Study Type: Split Night CPAP  Indication for sleep study: Congestive Heart Failure, Diabetes, Hypertension, Obesity, OSA, Re-Evaluation, Snoring  Epworth Sleepiness Score: 3  SLEEP STUDY TECHNIQUE As per the AASM Manual for the Scoring of Sleep and Associated Events v2.3 (April 2016) with a hypopnea requiring 4% desaturations.  The channels recorded and monitored were frontal, central and occipital EEG, electrooculogram (EOG), submentalis EMG (chin), nasal and oral airflow, thoracic and abdominal wall motion, anterior tibialis EMG, snore microphone, electrocardiogram, and pulse oximetry. Continuous positive airway pressure (CPAP) was initiated when the patient met split night criteria and was titrated according to treat sleep-disordered breathing.  MEDICATIONS     aspirin EC 81 MG EC tablet             Biotin 10000 MCG TABS         Cholecalciferol (VITAMIN D-3 PO)         Coenzyme Q-10 100 MG capsule         DULERA 100-5 MCG/ACT AERO         furosemide (LASIX) 20 MG tablet         Insulin Degludec (TRESIBA FLEXTOUCH) 200 UNIT/ML SOPN         insulin lispro (HUMALOG) 100 UNIT/ML injection         Insulin Pen Needle (B-D UF III MINI PEN NEEDLES) 31G X 5 MM MISC         irbesartan (AVAPRO) 75 MG tablet         liraglutide (VICTOZA) 18 MG/3ML SOPN         Metoprolol Tartrate (LOPRESSOR) 25 MG tablet         MULTIPLE VITAMIN PO         nitroGLYCERIN (NITROSTAT) 0.4 MG SL tablet         pravastatin (PRAVACHOL) 20 MG tablet         ticagrelor (BRILINTA) 90 MG TABS tablet         venlafaxine (EFFEXOR-XR) 150 MG 24 hr capsule      Medications  self-administered by patient taken the night of the study : METOPROLOL TARTRATE, TICAGRELOR  RESPIRATORY PARAMETERS Diagnostic Total AHI (/hr): 12.9 RDI (/hr): 15.8 OA Index (/hr): 1.7 CA Index (/hr): 2.1 REM AHI (/hr): N/A NREM AHI (/hr): 12.9 Supine AHI (/hr): 6.5 Non-supine AHI (/hr): 18.00 Min O2 Sat (%): 88.0 Mean O2 (%): 94.2 Time below 88% (min): 0.1   Titration Optimal Pressure (cm):  AHI at Optimal Pressure (/hr): N/A Min O2 at Optimal Pressure (%): 91.0 Supine % at Optimal (%): N/A Sleep % at Optimal (%): N/A   SLEEP ARCHITECTURE The recording time for the entire night was 449.2 minutes.  During a baseline period of 201.5 minutes, the patient slept for 144.5 minutes in REM and nonREM, yielding a sleep efficiency of 71.7%%. Sleep onset after lights out was 10.2 minutes with a REM latency of N/A minutes. The patient spent 10.0%% of the night in stage N1 sleep, 90.0%% in stage N2 sleep, 0.0%% in stage N3 and 0.0%% in REM.  During the titration period of 242.6 minutes, the patient slept for 205.6 minutes in REM and nonREM, yielding a sleep efficiency of 84.8%%. Sleep onset after CPAP initiation was 0.0  minutes with a REM latency of 3.7 minutes. The patient spent 10.8%% of the night in stage N1 sleep, 58.1%% in stage N2 sleep, 0.0%% in stage N3 and 31.1%% in REM.  CARDIAC DATA The 2 lead EKG demonstrated sinus rhythm. The mean heart rate was 100.0 beats per minute. Other EKG findings include: PVCs.  LEG MOVEMENT DATA The total Periodic Limb Movements of Sleep (PLMS) were 0. The PLMS index was 0.0 .  IMPRESSIONS - Mild-moderate obstructive sleep apnea occurred during the diagnostic portion of the study (AHI 12.9 /h; RDI 15.8/h); however, this may underestimate the severity since REM sleep was not achieved. An optimal PAP pressure could not be selected for this patient based on the available study data. Central events were present with CPAP titration.  AHI at 11 cm was 88.3/h with 38  central events.  - Mild central sleep apnea occurred during the diagnostic portion of the study (CAI = 2.1/hour). - The patient had minimal oxygen desaturation during the diagnostic portion of the study to a nadir of 88%. - The patient snored with moderate snoring volume during the diagnostic portion of the study. - EKG findings include PVCs. - Clinically significant periodic limb movements did not occur during sleep.  DIAGNOSIS - Obstructive Sleep Apnea (327.23 [G47.33 ICD-10])  RECOMMENDATIONS - Recommend BiPAP titration given sub-optimal CPAP titration with CPAP related central events.  - Efforts should be made to optiize nasal and oropharyhngeal patency. - Avoid alcohol, sedatives and other CNS depressants that may worsen sleep apnea and disrupt normal sleep architecture. - Sleep hygiene should be reviewed to assess factors that may improve sleep quality. - Weight management and regular exercise should be initiated or continued.  [Electronically signed] 08/15/2017 07:46 AM  Shelva Majestic MD, [facility], [place], American Board of Sleep Medicine   NPI: [id number] [place] PH: [phone]   FX: [phone] [place]

## 2017-08-15 NOTE — Telephone Encounter (Signed)
Attempted to call pt to confirm dosage of medication; did not get an answer and left voicemail.

## 2017-08-16 ENCOUNTER — Telehealth: Payer: Self-pay | Admitting: Physician Assistant

## 2017-08-16 NOTE — Telephone Encounter (Signed)
Victoza PA is approved.

## 2017-08-16 NOTE — Telephone Encounter (Signed)
Spoke with pt.  Pt verified that she is taking Pravastatin 20 mg. In Vin Bhagat's note, he mentions Rosuvastatin 20 mg. Pt states she is only taking the Pravastatin. In Vin's office note, it states labs as planned, but pt has no lab orders or an appt. Will reach out to Vin to see when pt is needed to repeat labs.

## 2017-08-16 NOTE — Telephone Encounter (Signed)
Please call patient. I received fax from [place] stating patient had 2 different types of statins filled by their insurance, atorvastatin filled 06/29/17 (by me at hospital dc) then rosuvastatin 5mg  07/25/17 by Dr. Dwyane Dee. Her last note with Vin indicates pravastatin in the office note but the assessment and plan lists rosuvastatin 20mg  daily. He also recommends to f/u LFT and lipid panel as scheduled. Do not see this scheduled. Please call patient to clarify statin dose then send to [facility] to determine best plan for follow-up.  Jaevon Paras PA-C

## 2017-08-17 NOTE — Telephone Encounter (Signed)
Per Nicki Reaper note 07/06/17  "DC atorvastatin -Start rosuvastatin 20 mill grams daily -Check lipids and LFTs in 6-8 weeks".  Patient has follow up with Nicki Reaper next month. Tell patient to bring all meds. She has active order for lipid and LFTS that can be drawn during follow up.

## 2017-08-17 NOTE — Telephone Encounter (Signed)
Spoke with pt and she has been advised to bring her bottles of medications with her to her f/u appt 08/31/17. Pt verbalized understanding.

## 2017-08-20 ENCOUNTER — Telehealth: Payer: Self-pay | Admitting: *Deleted

## 2017-08-20 NOTE — Progress Notes (Signed)
Correction to previous note. Approval received from insurance company for BIPAP titration study.

## 2017-08-20 NOTE — Progress Notes (Signed)
Patient informed of sleep study results and recommendations.  Approval for BIPAP received from her insurance company.

## 2017-08-20 NOTE — Telephone Encounter (Signed)
patient notified of 09/10/17 BIPAP titration study appointment.

## 2017-08-21 ENCOUNTER — Ambulatory Visit: Payer: 59 | Admitting: Neurology

## 2017-08-23 ENCOUNTER — Other Ambulatory Visit: Payer: Self-pay | Admitting: Endocrinology

## 2017-08-23 ENCOUNTER — Encounter: Payer: Self-pay | Admitting: Endocrinology

## 2017-08-23 ENCOUNTER — Telehealth: Payer: Self-pay

## 2017-08-23 MED ORDER — EVOLOCUMAB 140 MG/ML ~~LOC~~ SOAJ: 140.0000 mg | SUBCUTANEOUS | 3 refills | Status: DC

## 2017-08-23 NOTE — Telephone Encounter (Signed)
PA put in for patient to receive Repatha.  Key: [id number]

## 2017-08-27 ENCOUNTER — Telehealth: Payer: Self-pay

## 2017-08-27 NOTE — Telephone Encounter (Signed)
PA initiated for patient to receive coverage for Repatha.  Key is [id number]

## 2017-08-30 NOTE — Progress Notes (Signed)
Cardiology Office Note:    Date:  08/31/2017   ID:  Destiny Beasley, DOB [birth year], MRN [id number]  PCP:  Lawerance Cruel, MD  Cardiologist:  Sherren Mocha, MD   Referring MD: Lawerance Cruel, MD   Chief Complaint  Patient presents with  . Follow-up    CAD, CHF    History of Present Illness:    Destiny Beasley is a 62 y.o. female with coronary artery disease status post inferolateral STEMI in March 5885 complicated by cardiogenic shock, acute systolic heart failure and complete heart block.  Cardiac catheterization demonstrated an occluded proximal left circumflex which was treated with a drug-eluting stent.  EF is 40-45% by echocardiogram.  Other history includes diabetes, prior TIA, sleep apnea, breast cancer, hypertension, hyperlipidemia.  She was last seen 08/09/2017 due to volume excess.  Her diuretic had been adjusted prior to this visit with improved symptoms.    Destiny Beasley returns for follow-up.  She is here with her daughter.  She notes shortness of breath at times.  However, she is able to exert herself at other times without shortness of breath.  She denies chest discomfort.  She denies lower extremity swelling.  She denies PND.  She sleeps with CPAP but needs BiPAP.  She continues to note abdominal bloating.  She does note early satiety and occasional nausea after eating.  She denies any bleeding issues.  Prior CV studies:   The following studies were reviewed today:  Echo 06/26/17 EF 40-45, inferolateral hypokinesis, mild MR  Cardiac catheterization 06/25/17 LM luminal irregularities LAD luminal irregularities, proximal 30 LCx proximal 100 RCA irregularities EF 45-50, inferolateral akinesis PCI: 4 x 20 mm Synergy DES to the proximal LCx  Echo 03/25/16 Moderate LVH, EF 60-65, normal wall motion  Carotid US 12/17 Bilateral ICA 1-39  Nuclear stress test 7/13 1. Negative for pharmacologic-stress induced ischemia. 2. Left ventricular ejection fraction  65%.  Past Medical History:  Diagnosis Date  . Anemia   . Anxiety   . CAD in native artery    a. Inf STEMI 0/2774 complicated by cardiogenic shock/complete heart block s/p emergent cath showing culprit large dominant LCx s/p aspiration thrombectomy and DES, EF 50% by cath, 40-45% by echo.  . Complete heart block, transient ([place])    a. 06/2017 in setting of acute inf MI -> resolved after PCI.  . Diabetes mellitus   . Goiter   . History of radiation therapy 06/14/2016 - 07/19/2016   Right Breast 50 Gy 25 fractions  . Hypercholesteremia   . Hypertension   . Ischemic cardiomyopathy    a. EF 50% by cath and 40-45% by echo 06/2017.  . Malignant neoplasm of upper-outer quadrant of right female breast ([place]) 02/24/2016  . Meningioma ([place])   . Obesity   . Personal history of chemotherapy   . Personal history of radiation therapy   . Pituitary tumor   . Seasonal asthma   . Sleep apnea    does not use every night  . Stroke [facility])    TIA - Dec 1 st, 2017   Surgical Hx: The patient  has a past surgical history that includes Cesarean section; Abdominal hysterectomy; Thyroid surgery; Pituitary surgery; Brain meningioma excision (2005); Meniscus repair (Left, 2015); Foot surgery (Bilateral, 2016); Breast lumpectomy with radioactive seed localization (Right, 04/20/2016); Breast reduction surgery (Bilateral, 10/23/2016); Breast lumpectomy (Right); Breast biopsy; Reduction mammaplasty; LEFT HEART CATH AND CORONARY ANGIOGRAPHY (N/A, 06/25/2017); RIGHT HEART CATH (N/A, 06/25/2017); Coronary/Graft Acute MI Revascularization (N/A,  06/25/2017); and CORONARY STENT INTERVENTION (N/A, 06/25/2017).   Current Medications: Current Meds  Medication Sig  . aspirin EC 81 MG EC tablet Take 1 tablet (81 mg total) by mouth daily.  . Biotin 10000 MCG TABS Take by mouth.  . Cholecalciferol (VITAMIN D-3 PO) Take by mouth.  . Coenzyme Q-10 100 MG capsule Take 100 mg by mouth daily.  . DULERA 100-5 MCG/ACT AERO Inhale 2 puffs into  the lungs 2 (two) times daily as needed for wheezing or shortness of breath (allergies).   . Evolocumab (REPATHA SURECLICK) 349 MG/ML SOAJ Inject 140 mg into the skin every 14 (fourteen) days.  . furosemide (LASIX) 20 MG tablet Take 1 tablet (20 mg total) by mouth 2 (two) times daily.  . Insulin Degludec (TRESIBA FLEXTOUCH) 200 UNIT/ML SOPN Inject 70 Units into the skin daily.  . insulin lispro (HUMALOG) 100 UNIT/ML injection Inject 0.1 mLs (10 Units total) into the skin 3 (three) times daily before meals.  . Insulin Pen Needle (B-D UF III MINI PEN NEEDLES) 31G X 5 MM MISC USE TO INJECT INSULIN FOUR TIMES DAILY  . irbesartan (AVAPRO) 75 MG tablet Take 1 tablet (75 mg total) by mouth daily.  Marland Kitchen liraglutide (VICTOZA) 18 MG/3ML SOPN ADMINISTER 1.8 MG UNDER THE SKIN DAILY  . Metoprolol Tartrate (LOPRESSOR) 25 MG tablet Take 1.5 tablets (37.5 mg total) by mouth 2 (two) times daily.  . MULTIPLE VITAMIN PO Take 1 tablet daily by mouth.  . nitroGLYCERIN (NITROSTAT) 0.4 MG SL tablet Place 1 tablet (0.4 mg total) under the tongue every 5 (five) minutes as needed for chest pain (up to 3 doses. If taking 3rd dose call 911).  . ticagrelor (BRILINTA) 90 MG TABS tablet Take 1 tablet (90 mg total) by mouth 2 (two) times daily.  Marland Kitchen venlafaxine (EFFEXOR-XR) 150 MG 24 hr capsule Take 150 mg by mouth daily.   . [DISCONTINUED] pravastatin (PRAVACHOL) 20 MG tablet TAKE 1 BY MOUTH MONDAYS, Wednesday AND FRIDAY     Allergies:   Hydrocodone; Invokana [canagliflozin]; and Atorvastatin   Social History   Tobacco Use  . Smoking status: Never Smoker  . Smokeless tobacco: Never Used  Substance Use Topics  . Alcohol use: Yes    Alcohol/week: 0.6 oz    Types: 1 Glasses of wine per week    Comment: 1 glass monthly  . Drug use: No     Family Hx: The patient's family history includes Diabetes in her mother; Hyperlipidemia in her mother; Hypertension in her mother.  ROS:   Please see the history of present illness.     Review of Systems  Cardiovascular: Positive for dyspnea on exertion.  Respiratory: Positive for shortness of breath.   Musculoskeletal: Positive for joint pain and myalgias.  Gastrointestinal: Positive for abdominal pain.  Neurological: Positive for dizziness.   All other systems reviewed and are negative.   EKGs/Labs/Other Test Reviewed:    EKG:  EKG is not ordered today.    Recent Labs: 06/25/2017: B Natriuretic Peptide 58.0 07/06/2017: Hemoglobin 12.8; Platelets 560 08/03/2017: ALT 21; BUN 18; Creatinine, Ser 1.05; Potassium 4.0; Sodium 142   Recent Lipid Panel Lab Results  Component Value Date/Time   CHOL 142 08/03/2017 08:23 AM   TRIG 55.0 08/03/2017 08:23 AM   HDL 49.00 08/03/2017 08:23 AM   CHOLHDL 3 08/03/2017 08:23 AM   LDLCALC 82 08/03/2017 08:23 AM    Physical Exam:    VS:  BP 108/64   Pulse 87   Ht 6' (  1.829 m)   Wt 241 lb (109.3 kg)   SpO2 99%   BMI 32.69 kg/m     Wt Readings from Last 3 Encounters:  08/31/17 241 lb (109.3 kg)  08/09/17 242 lb (109.8 kg)  08/08/17 241 lb 9.6 oz (109.6 kg)     Physical Exam  Constitutional: She is oriented to person, place, and time. She appears well-developed and well-nourished. No distress.  HENT:  Head: Normocephalic and atraumatic.  Neck: Neck supple. No JVD present.  Cardiovascular: Normal rate, regular rhythm, S1 normal, S2 normal and normal heart sounds.  No murmur heard. Pulmonary/Chest: Effort normal. She has no rales.  Abdominal: Soft. She exhibits distension. There is no hepatomegaly.  Musculoskeletal: She exhibits no edema.  Neurological: She is alert and oriented to person, place, and time.  Skin: Skin is warm and dry.    ASSESSMENT & PLAN:    Chronic systolic heart failure (HCC) EF 40-45.  She is NYHA 2.  She does have occasional shortness of breath.  Overall, her volume appears to be stable.  Weight is unchanged.  Lungs are clear.  Her neck veins are flat.  I suspect that her shortness of breath is  more related to side effects from Brilinta.  We had a long discussion regarding the benefits of Brilinta.  At this point, she would like to try to remain on Brilinta.    -Continue current dose of furosemide, irbesartan, metoprolol.  -BMET today  Coronary artery disease involving native coronary artery of native heart without angina pectoris Status post inferoposterior STEMI March 5885 complicated by cardiogenic shock, complete heart block and systolic heart failure.  She is doing well without angina.  As noted, I do believe she is having side effects to Brilinta with shortness of breath.  She would like to try to remain on Brilinta for now.  If she continues to have issues, we will need to change her Brilinta to Plavix.  Continue aspirin, ARB, beta-blocker.  Essential hypertension The patient's blood pressure is controlled on her current regimen.  Continue current therapy.   Hyperlipidemia, unspecified hyperlipidemia type She is intolerant of statins.  She could not tolerate atorvastatin, rosuvastatin.  She continues to have dizziness with pravastatin.  Her endocrinologist is trying to get her established with a PCSK9 inhibitor.  As she continues to have side effects, I have asked her to go ahead and stop her pravastatin.  OSA (obstructive sleep apnea) She is getting set up with BiPAP.  Diabetes mellitus type 2 I suspect that her abdominal bloating may be related to gastroparesis.  I have asked her to follow-up with endocrinology.   Dispo:  Return in about 4 months (around 01/01/2018) for Routine Follow Up, w/ Dr. Burt Knack.   Medication Adjustments/Labs and Tests Ordered: Current medicines are reviewed at length with the patient today.  Concerns regarding medicines are outlined above.  Tests Ordered: Orders Placed This Encounter  Procedures  . Basic Metabolic Panel (BMET)   Medication Changes: No orders of the defined types were placed in this encounter.   Signed, Richardson Dopp, PA-C    08/31/2017 9:03 AM    [facility] [address], [place], [place]  [address] Phone: [phone]; Fax: [phone]

## 2017-08-31 ENCOUNTER — Ambulatory Visit (INDEPENDENT_AMBULATORY_CARE_PROVIDER_SITE_OTHER): Payer: 59 | Admitting: Physician Assistant

## 2017-08-31 ENCOUNTER — Encounter: Payer: Self-pay | Admitting: Physician Assistant

## 2017-08-31 VITALS — BP 108/64 | HR 87 | Ht 72.0 in | Wt 241.0 lb

## 2017-08-31 DIAGNOSIS — I5022 Chronic systolic (congestive) heart failure: Secondary | ICD-10-CM | POA: Diagnosis not present

## 2017-08-31 DIAGNOSIS — G4733 Obstructive sleep apnea (adult) (pediatric): Secondary | ICD-10-CM | POA: Diagnosis not present

## 2017-08-31 DIAGNOSIS — Z794 Long term (current) use of insulin: Secondary | ICD-10-CM

## 2017-08-31 DIAGNOSIS — I10 Essential (primary) hypertension: Secondary | ICD-10-CM

## 2017-08-31 DIAGNOSIS — E119 Type 2 diabetes mellitus without complications: Secondary | ICD-10-CM | POA: Diagnosis not present

## 2017-08-31 DIAGNOSIS — E785 Hyperlipidemia, unspecified: Secondary | ICD-10-CM

## 2017-08-31 DIAGNOSIS — I2510 Atherosclerotic heart disease of native coronary artery without angina pectoris: Secondary | ICD-10-CM | POA: Diagnosis not present

## 2017-08-31 LAB — BASIC METABOLIC PANEL
BUN/Creatinine Ratio: 12 (ref 9–23)
BUN: 11 mg/dL (ref 6–24)
CO2: 26 mmol/L (ref 20–29)
Calcium: 9.8 mg/dL (ref 8.7–10.2)
Chloride: 99 mmol/L (ref 96–106)
Creatinine, Ser: 0.95 mg/dL (ref 0.57–1.00)
GFR, EST AFRICAN AMERICAN: 76 mL/min/{1.73_m2} (ref >59)
GFR, EST NON AFRICAN AMERICAN: 66 mL/min/{1.73_m2} (ref >59)
Glucose: 145 mg/dL — ABNORMAL HIGH (ref 65–99)
POTASSIUM: 4.9 mmol/L (ref 3.5–5.2)
Sodium: 139 mmol/L (ref 134–144)

## 2017-08-31 NOTE — Patient Instructions (Addendum)
Medication Instructions:  1. STOP PRAVASTATIN  2. CONTINUE ALL OTHER MEDICATIONS AS PRESCIRBED  Labwork: TODAY BMET  Testing/Procedures: NONE ORDERED TODAY  Follow-Up: Your physician wants you to follow-up in: SEPT 2019 WITH DR. Emelda Fear will receive a reminder letter in the mail two months in advance. If you don't receive a letter, please call our office to schedule the follow-up appointment.   Any Other Special Instructions Will Be Listed Below (If Applicable).     If you need a refill on your cardiac medications before your next appointment, please call your pharmacy.

## 2017-09-05 ENCOUNTER — Encounter: Payer: Self-pay | Admitting: Endocrinology

## 2017-09-07 ENCOUNTER — Telehealth: Payer: Self-pay | Admitting: Endocrinology

## 2017-09-07 ENCOUNTER — Telehealth: Payer: Self-pay

## 2017-09-07 NOTE — Telephone Encounter (Signed)
[facility] called -they need PA for Repatha. Please call them atPh#[phone]

## 2017-09-07 NOTE — Telephone Encounter (Signed)
Spoke with Vaughan Basta at number listed below. She attempted to pull up the information for the patient and it is still denying coverage. I read Vaughan Basta the fax received this morning from patient's insurance, [employer], and gave her the case number. Vaughan Basta stated that she would call the patients insurance and find out why it is still being denied, after an approval has been issued.

## 2017-09-07 NOTE — Telephone Encounter (Signed)
Received fax from [place] stating that patient has been approved for Repatha from 08/23/2017-02/23/2018.  Principal prodedure code: [id number] Quantity of number 6 Months. Next review date is 02/24/2018.

## 2017-09-10 ENCOUNTER — Ambulatory Visit (HOSPITAL_BASED_OUTPATIENT_CLINIC_OR_DEPARTMENT_OTHER): Payer: 59

## 2017-09-10 ENCOUNTER — Telehealth: Payer: Self-pay

## 2017-09-10 NOTE — Telephone Encounter (Signed)
Called patient and notified her that [place] was approved by her insurance company and it could be picked up at the pharmacy.

## 2017-09-12 ENCOUNTER — Telehealth (HOSPITAL_COMMUNITY): Payer: Self-pay | Admitting: Pharmacist

## 2017-09-12 NOTE — Telephone Encounter (Signed)
Cardiac Rehab Medication Review by a Pharmacist  Does the patient  feel that his/her medications are working for him/her?  yes  Has the patient been experiencing any side effects to the medications prescribed?  no  Does the patient measure his/her own blood pressure or blood glucose at home?  Yes, both   Does the patient have any problems obtaining medications due to transportation or finances?   no  Understanding of regimen: good Understanding of indications: good Potential of compliance: good    Pharmacist comments: Destiny Beasley is a 62 y.o. female in high spirits over the phone this evening as we discussed her medications and compliance for cardiac rehab orientation intake. She noted no concerns with her current medication regimen and had no further questions for me at this time.    [name], Pharm.D. PGY1 Pharmacy Resident 09/12/2017 6:06 PM Main Pharmacy: [phone]

## 2017-09-20 ENCOUNTER — Inpatient Hospital Stay (HOSPITAL_COMMUNITY): Admission: RE | Admit: 2017-09-20 | Payer: 59 | Source: Ambulatory Visit

## 2017-09-24 ENCOUNTER — Ambulatory Visit (HOSPITAL_COMMUNITY): Payer: 59

## 2017-09-25 ENCOUNTER — Other Ambulatory Visit: Payer: Self-pay

## 2017-09-25 ENCOUNTER — Encounter: Payer: Self-pay | Admitting: Physician Assistant

## 2017-09-25 ENCOUNTER — Telehealth: Payer: Self-pay | Admitting: Endocrinology

## 2017-09-25 NOTE — Telephone Encounter (Signed)
Destiny Beasley from cover my meds calling on the status PA for  Novalog solution,  Please advise  Ref key [id number]

## 2017-09-25 NOTE — Telephone Encounter (Signed)
I have spoken to the patient and this has been resolved patient will continue to take Humalog

## 2017-09-26 ENCOUNTER — Ambulatory Visit (HOSPITAL_COMMUNITY): Payer: 59

## 2017-09-26 ENCOUNTER — Encounter: Payer: Self-pay | Admitting: Physician Assistant

## 2017-09-26 ENCOUNTER — Telehealth (HOSPITAL_COMMUNITY): Payer: Self-pay

## 2017-09-26 NOTE — Telephone Encounter (Signed)
Pt sent another [facility] CHART message today asking for the clearance letter.

## 2017-09-26 NOTE — Telephone Encounter (Signed)
Please contact the patient and get more information regarding her planned surgery. We need the surgeon's name and office number. We will need to contact the surgeon's off to find out what procedure needs to be done and when.  Richardson Dopp, PA-C    09/26/2017 5:40 PM

## 2017-09-26 NOTE — Telephone Encounter (Signed)
Called patient to see if she was interested in rescheduling orientation - Patient stated she is back at work. Doing well, going to pool 3 days a week. Closed referral.

## 2017-09-28 ENCOUNTER — Ambulatory Visit (HOSPITAL_COMMUNITY): Payer: 59

## 2017-10-01 ENCOUNTER — Encounter: Payer: Self-pay | Admitting: Physician Assistant

## 2017-10-01 ENCOUNTER — Ambulatory Visit (HOSPITAL_COMMUNITY): Payer: 59

## 2017-10-01 NOTE — Telephone Encounter (Signed)
I spoke to Dr. Georgia Lopes (the patient's plastic surgeon).  He plans reconstructive surgery.  I explained the importance of dual antiplatelet therapy for 1 year post ACS to Dr. Georgia Lopes.  He prefers to hold off on her surgery due to the high risk of bleeding.  I called the patient and explained this to her.  I also sent a letter explaining her condition to Dr. Georgia Lopes.  Richardson Dopp, PA-C    10/01/2017 5:14 PM

## 2017-10-02 ENCOUNTER — Ambulatory Visit (HOSPITAL_BASED_OUTPATIENT_CLINIC_OR_DEPARTMENT_OTHER): Payer: 59 | Attending: Cardiovascular Disease | Admitting: Cardiovascular Disease

## 2017-10-02 ENCOUNTER — Encounter

## 2017-10-02 VITALS — Ht 72.0 in | Wt 241.0 lb

## 2017-10-02 DIAGNOSIS — Z79899 Other long term (current) drug therapy: Secondary | ICD-10-CM | POA: Diagnosis not present

## 2017-10-02 DIAGNOSIS — Z7982 Long term (current) use of aspirin: Secondary | ICD-10-CM | POA: Insufficient documentation

## 2017-10-02 DIAGNOSIS — G4733 Obstructive sleep apnea (adult) (pediatric): Secondary | ICD-10-CM

## 2017-10-02 DIAGNOSIS — Z7902 Long term (current) use of antithrombotics/antiplatelets: Secondary | ICD-10-CM | POA: Insufficient documentation

## 2017-10-02 DIAGNOSIS — Z794 Long term (current) use of insulin: Secondary | ICD-10-CM | POA: Insufficient documentation

## 2017-10-03 ENCOUNTER — Ambulatory Visit (HOSPITAL_COMMUNITY): Payer: 59

## 2017-10-05 ENCOUNTER — Ambulatory Visit (HOSPITAL_COMMUNITY): Payer: 59

## 2017-10-07 NOTE — Procedures (Signed)
Patient Name: Destiny Beasley, [name] Date: 10/02/2017 Gender: Female D.O.B: Aug 01, 1955 Age (years): 78 Referring Provider: Shelva Majestic MD, ABSM Height (inches): 72 Interpreting Physician: Shelva Majestic MD, ABSM Weight (lbs): 242 RPSGT: Zadie Rhine BMI: 33 MRN: [id number] Neck Size: 16.00  CLINICAL INFORMATION The patient is referred for a BiPAP titration to treat sleep apnea.  Date of NPSG, Split Night or HST: 07/23/2017:  AHI 12.9/8, RDI 15.8/H with absence of sleep; suboptimal CPAP titration  SLEEP STUDY TECHNIQUE As per the AASM Manual for the Scoring of Sleep and Associated Events v2.3 (April 2016) with a hypopnea requiring 4% desaturations.  The channels recorded and monitored were frontal, central and occipital EEG, electrooculogram (EOG), submentalis EMG (chin), nasal and oral airflow, thoracic and abdominal wall motion, anterior tibialis EMG, snore microphone, electrocardiogram, and pulse oximetry. Bilevel positive airway pressure (BPAP) was initiated at the beginning of the study and titrated to treat sleep-disordered breathing.  MEDICATIONS     aspirin EC 81 MG EC tablet         Biotin 10000 MCG TABS         Cholecalciferol (VITAMIN D-3 PO)         Coenzyme Q-10 100 MG capsule         DULERA 100-5 MCG/ACT AERO         Evolocumab (REPATHA SURECLICK) 528 MG/ML SOAJ         furosemide (LASIX) 20 MG tablet         Insulin Degludec (TRESIBA FLEXTOUCH) 200 UNIT/ML SOPN         insulin lispro (HUMALOG) 100 UNIT/ML injection         Insulin Pen Needle (B-D UF III MINI PEN NEEDLES) 31G X 5 MM MISC         irbesartan (AVAPRO) 75 MG tablet         liraglutide (VICTOZA) 18 MG/3ML SOPN         Metoprolol Tartrate (LOPRESSOR) 25 MG tablet         MULTIPLE VITAMIN PO         nitroGLYCERIN (NITROSTAT) 0.4 MG SL tablet         ticagrelor (BRILINTA) 90 MG TABS tablet         venlafaxine (EFFEXOR-XR) 150 MG 24 hr capsule      Medications self-administered by patient taken  the night of the study : METOPROLOL TARTRATE, TICAGRELOR, HUMALOG  RESPIRATORY PARAMETERS Optimal IPAP Pressure (cm): 13 AHI at Optimal Pressure (/hr) 0.0 Optimal EPAP Pressure (cm): 9   Overall Minimal O2 (%): 92.0 Minimal O2 at Optimal Pressure (%): 94.0  SLEEP ARCHITECTURE Start Time: 9:48:40 PM Stop Time: 4:28:48 AM Total Time (min): 400.1 Total Sleep Time (min): 288.7 Sleep Latency (min): 25.9 Sleep Efficiency (%): 72.2% REM Latency (min): 254.0 WASO (min): 85.5 Stage N1 (%): 11.6% Stage N2 (%): 65.4% Stage N3 (%): 0.0% Stage R (%): 23.03 Supine (%): 67.08 Arousal Index (/hr): 31.2   CARDIAC DATA The 2 lead EKG demonstrated sinus rhythm. The mean heart rate was 78.7 beats per minute. Other EKG findings include: PVCs.  LEG MOVEMENT DATA The total Periodic Limb Movements of Sleep (PLMS) were 0. The PLMS index was 0.0. A PLMS index of <15 is considered normal in adults.  IMPRESSIONS - BiPAP was started at 8/4 and was titrated up to 13/9.  Optimal pressure was 13/9.  AHI was 0.  Oxygen nadir was 94% - Central sleep apnea was not noted during this titration (CAI =  0.8/h). - Significant oxygen desaturations were not observed during this titration (min O2 = 92.0%). - No snoring was audible during this study. - 2-lead EKG demonstrated: PVCs - Clinically significant periodic limb movements were not noted during this study. Arousals associated with PLMs were rare.  DIAGNOSIS - Obstructive Sleep Apnea (327.23 [G47.33 ICD-10])  RECOMMENDATIONS - Recommend an initial trial of BiPAP therapy at 13/9 cm H2O with heated humidification. A Large size Philips Respironics Nasal Mask Wisp Mask mask was used for the titration. - Effort should be made to optimize nasal and oropharyngeal patency. - Avoid alcohol, sedatives and other CNS depressants that may worsen sleep apnea and disrupt normal sleep architecture. - Sleep hygiene should be reviewed to assess factors that may improve sleep quality. -  Weight management and regular exercise should be initiated or continued. - Recommend a download be obtained in 30 days and the patient be seen in sleep clinic after 4 weeks of therapy.  [Electronically signed] 10/07/2017 01:43 PM  Shelva Majestic MD, [facility], ABSM Diplomate, American Board of Sleep Medicine   NPI: [id number] [place] PH: [phone]   FX: [phone] [place]

## 2017-10-08 ENCOUNTER — Ambulatory Visit (HOSPITAL_COMMUNITY): Payer: 59

## 2017-10-09 ENCOUNTER — Telehealth: Payer: Self-pay | Admitting: *Deleted

## 2017-10-09 ENCOUNTER — Telehealth: Payer: Self-pay | Admitting: Physician Assistant

## 2017-10-09 NOTE — Telephone Encounter (Signed)
Disability Parking Pla-Card faxed over by patient. Placed in [place] PA-C box to be addressed.

## 2017-10-09 NOTE — Telephone Encounter (Signed)
Pt hs been notified Handicap Placard form will be waiting for her pick up. Pt was encouraged per Richardson Dopp, PA to walk as much as she can. Pt is agreeable to plan of care.

## 2017-10-10 ENCOUNTER — Ambulatory Visit (HOSPITAL_COMMUNITY): Payer: 59

## 2017-10-12 ENCOUNTER — Ambulatory Visit (HOSPITAL_COMMUNITY): Payer: 59

## 2017-10-12 ENCOUNTER — Other Ambulatory Visit: Payer: Self-pay | Admitting: Endocrinology

## 2017-10-12 ENCOUNTER — Other Ambulatory Visit: Payer: Self-pay

## 2017-10-12 ENCOUNTER — Telehealth: Payer: Self-pay

## 2017-10-12 NOTE — Telephone Encounter (Signed)
Pharmacy called and needed  Clarification on pt insulin dosage. According to last office note, pt is using insulin pump, and pt is now requesting insulin for injection TID. Pt was called to provide clarification as to why she is no longer on the pump and also as to what dosage she is currently giving herself. PT stated that she did not request a a refill and she has plenty of insulin that she is using in her pump. Pt stated that her pump is set to infuse a max of 80 units per day. Pt was informed that the pharmacy would be contacted and notified to disregard the order requested for Humalog 10 units SQ TID before meals. Pt verbalized understanding.

## 2017-10-15 ENCOUNTER — Ambulatory Visit (HOSPITAL_COMMUNITY): Payer: 59

## 2017-10-17 ENCOUNTER — Ambulatory Visit (HOSPITAL_COMMUNITY): Payer: 59

## 2017-10-19 ENCOUNTER — Ambulatory Visit (HOSPITAL_COMMUNITY): Payer: 59

## 2017-10-22 ENCOUNTER — Ambulatory Visit (HOSPITAL_COMMUNITY): Payer: 59

## 2017-10-24 ENCOUNTER — Ambulatory Visit (HOSPITAL_COMMUNITY): Payer: 59

## 2017-10-26 ENCOUNTER — Ambulatory Visit (HOSPITAL_COMMUNITY): Payer: 59

## 2017-10-29 ENCOUNTER — Ambulatory Visit (HOSPITAL_COMMUNITY): Payer: 59

## 2017-10-31 ENCOUNTER — Ambulatory Visit (HOSPITAL_COMMUNITY): Payer: 59

## 2017-11-01 ENCOUNTER — Encounter: Payer: Self-pay | Admitting: Cardiovascular Disease

## 2017-11-01 NOTE — Telephone Encounter (Signed)
Please contact patient to see if her shortness of breath is worse or changed since last seen.  If these are the same symptoms as before, we can change her Brilinta to Plavix. If her breathing is worse, she will need follow up with the first available provider on her care team.  If shortness of breath is unchanged: 1. Stop Brilinta (would take it in AM and PM on Friday and 1st dose of Plavix in AM on Sat) 2. Start Plavix 75 mg Once daily  Richardson Dopp, PA-C    11/01/2017 8:09 PM

## 2017-11-02 ENCOUNTER — Ambulatory Visit (HOSPITAL_COMMUNITY): Payer: 59

## 2017-11-02 NOTE — Telephone Encounter (Signed)
Left message to call back  

## 2017-11-02 NOTE — Telephone Encounter (Signed)
Please call patient.  She feels the side effects are from the Metoprolol. Continue Brilinta (Do not send in Rx for Plavix).   Continue on Brilinta 90 mg Twice daily.   She can decrease Metoprolol to 25 mg Twice daily x 2 days.   Then take 12.5 mg Twice daily x 1 day. Then, stop. If after a 3-4 days, she is still having the same symptoms, she needs to call to speak with the nurse.  At that point, we will resume the metoprolol and have to consider changing Brilinta to Plavix.  Richardson Dopp, PA-C    11/02/2017 10:07 AM

## 2017-11-05 ENCOUNTER — Ambulatory Visit (HOSPITAL_COMMUNITY): Payer: 59

## 2017-11-05 NOTE — Telephone Encounter (Signed)
Instructed patient to wean off metoprolol by decreasing to 25 mg BID for 2 days then 12.5 mg BID for one day then STOP. She will call in a few days if symptoms have not alleviated. She was grateful for call.

## 2017-11-06 ENCOUNTER — Other Ambulatory Visit: Payer: 59

## 2017-11-07 ENCOUNTER — Ambulatory Visit (HOSPITAL_COMMUNITY): Payer: 59

## 2017-11-07 ENCOUNTER — Other Ambulatory Visit (INDEPENDENT_AMBULATORY_CARE_PROVIDER_SITE_OTHER): Payer: 59

## 2017-11-07 DIAGNOSIS — Z794 Long term (current) use of insulin: Secondary | ICD-10-CM | POA: Diagnosis not present

## 2017-11-07 DIAGNOSIS — E1165 Type 2 diabetes mellitus with hyperglycemia: Secondary | ICD-10-CM

## 2017-11-07 LAB — COMPREHENSIVE METABOLIC PANEL
ALK PHOS: 110 U/L (ref 39–117)
ALT: 28 U/L (ref 0–35)
AST: 25 U/L (ref 0–37)
Albumin: 4.1 g/dL (ref 3.5–5.2)
BUN: 20 mg/dL (ref 6–23)
CALCIUM: 9.5 mg/dL (ref 8.4–10.5)
CO2: 28 mEq/L (ref 19–32)
Chloride: 107 mEq/L (ref 96–112)
Creatinine, Ser: 0.96 mg/dL (ref 0.40–1.20)
GFR: 76.3 mL/min (ref >60.00)
Glucose, Bld: 183 mg/dL — ABNORMAL HIGH (ref 70–99)
Potassium: 4.5 mEq/L (ref 3.5–5.1)
Sodium: 140 mEq/L (ref 135–145)
TOTAL PROTEIN: 7.7 g/dL (ref 6.0–8.3)
Total Bilirubin: 0.2 mg/dL (ref 0.2–1.2)

## 2017-11-07 LAB — HEMOGLOBIN A1C: Hgb A1c MFr Bld: 9 % — ABNORMAL HIGH (ref 4.6–6.5)

## 2017-11-07 IMAGING — MR MR MRA HEAD W/O CM
11 of 16 series · 27 of 48 positions shown · IV contrast (multihance)
Comparison: MR brain 11/06/2013.  CT head earlier today.

CLINICAL DATA: Emergency department patient with stroke-like
symptoms. History of pituitary resection 5773. History of CP angle
meningioma treated with gamma knife. Severe diabetes, insulin
dependent.

EXAM:
MRI HEAD WITHOUT AND WITH CONTRAST
MRA HEAD WITHOUT CONTRAST
TECHNIQUE: Multiplanar, multiecho pulse sequences of the brain and surrounding
structures were obtained without and with intravenous contrast.
Angiographic images of the head were obtained using MRA technique
without contrast.
CONTRAST:  20mL MULTIHANCE GADOBENATE DIMEGLUMINE 529 MG/ML IV SOLN

[Series 3: T1 · sagittal · 5.0mm · 0.47mm/px · 1 of 24 slices shown]
[im 1/24]
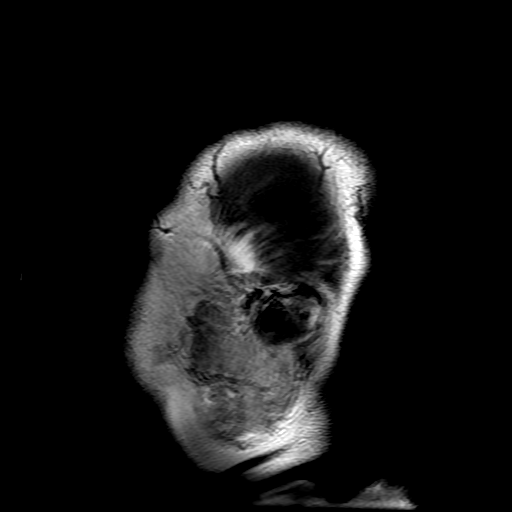

[Series 4: DWI · axial · 3.0mm · 1.09mm/px · z∈[-30,+124]mm · 6 of 106 slices shown (1 of 4)]
[im 1/106]
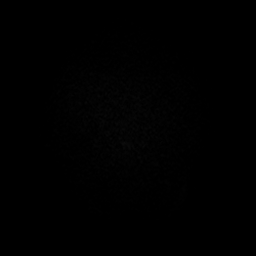
[im 22/106]
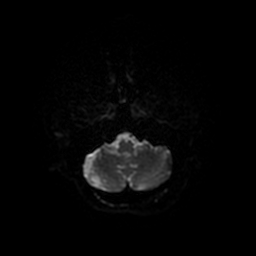
[im 43/106]
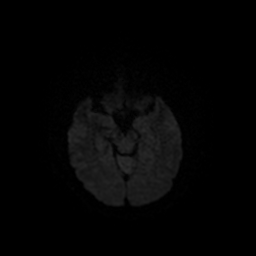
[im 64/106]
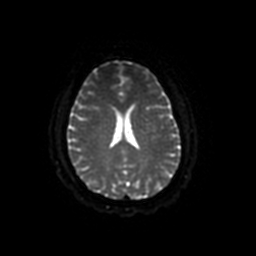
[im 85/106]
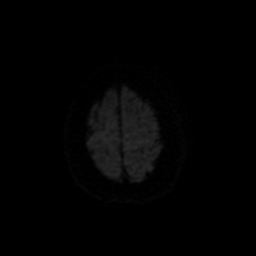
[im 106/106]
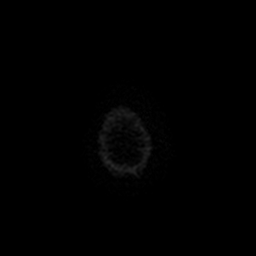

[Series 5: DWI · coronal · 5.0mm · 1.09mm/px · 3 of 62 slices shown (2 of 4)]
[im 1/62]
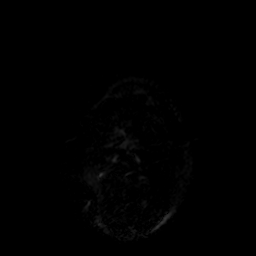
[im 31/62]
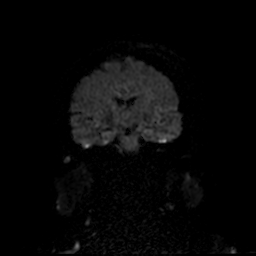
[im 62/62]
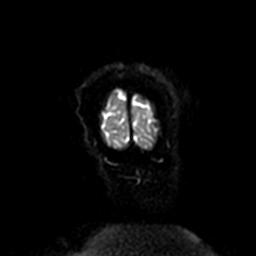

[Series 6: T2 · axial · 5.0mm · 0.43mm/px · z∈[-20,+134]mm · 2 of 25 slices shown (1 of 3)]
[im 1/25]
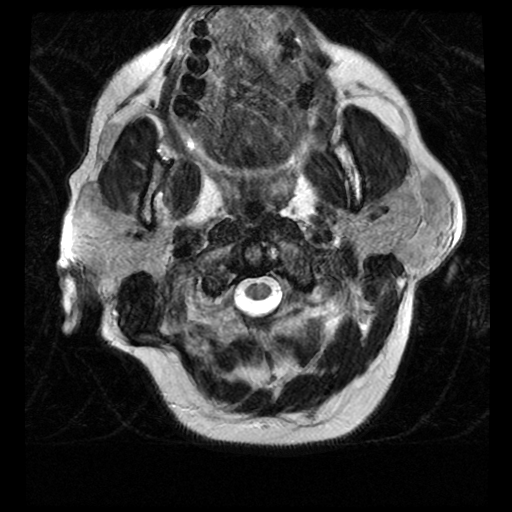
[im 25/25]
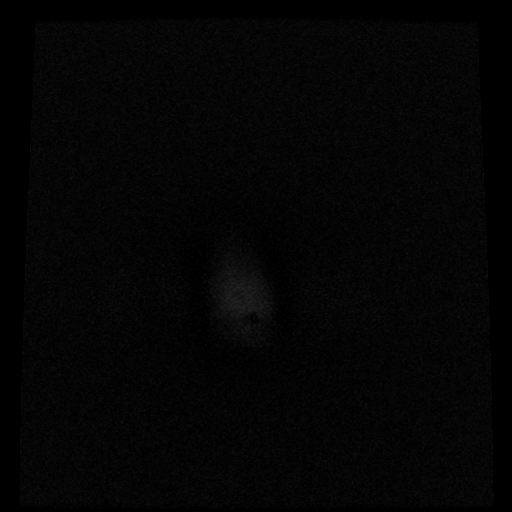

[Series 7: FLAIR · axial · 5.0mm · 0.43mm/px · z∈[-29,+143]mm · 2 of 30 slices shown]
[im 1/30]
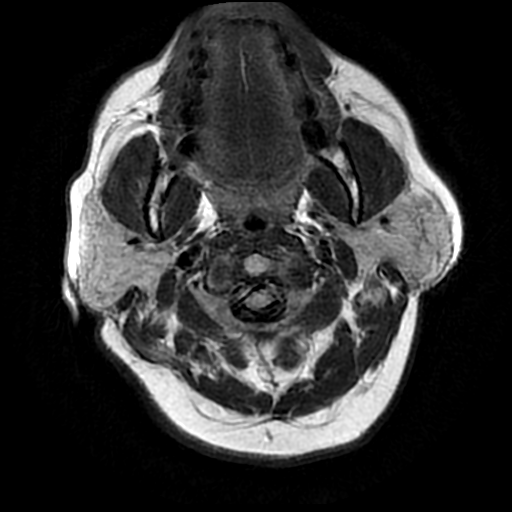
[im 30/30]
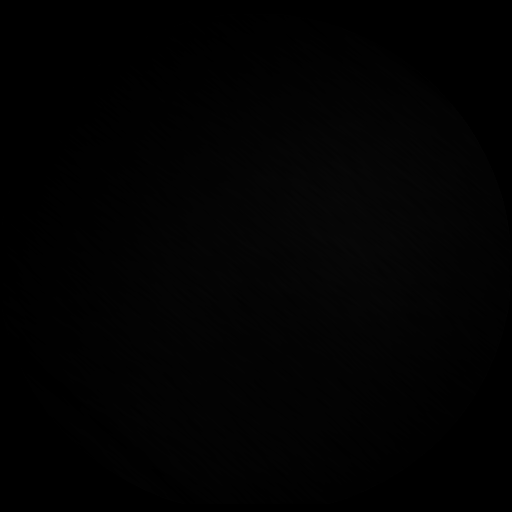

[Series 8: T2 · axial · 5.0mm · 0.43mm/px · z∈[-29,+143]mm · 2 of 30 slices shown (2 of 3)]
[im 1/30]
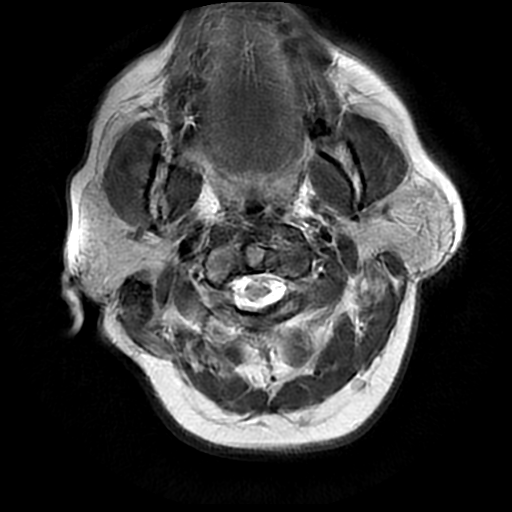
[im 30/30]
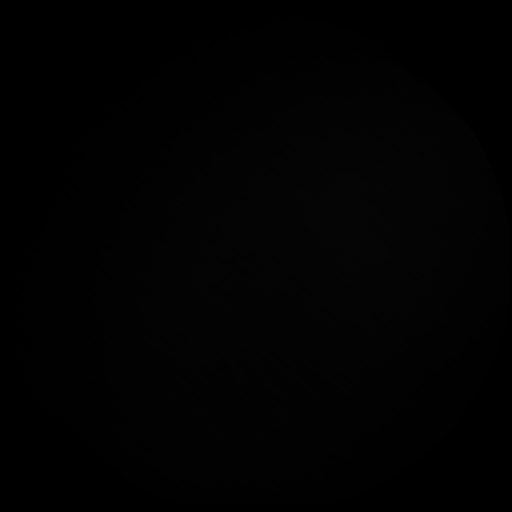

[Series 16: T2 · coronal · 5.0mm · 0.45mm/px · 2 of 27 slices shown (3 of 3)]
[im 1/27]
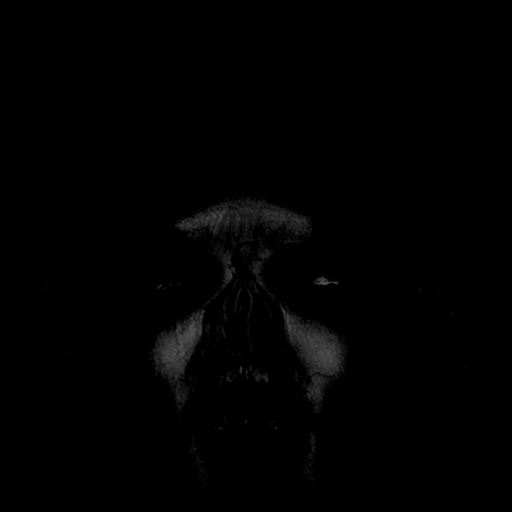
[im 27/27]
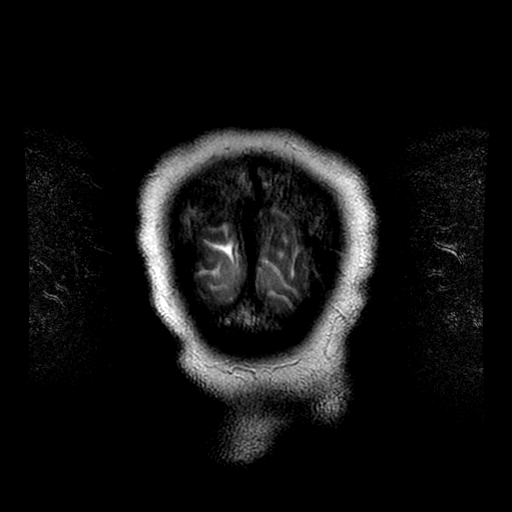

[Series 18: T1 post-contrast · coronal · 5.0mm · 0.45mm/px · 2 of 27 slices shown (1 of 2)]
[im 1/27]
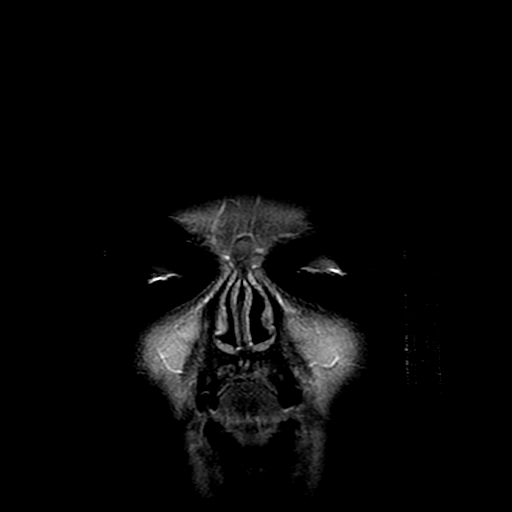
[im 27/27]
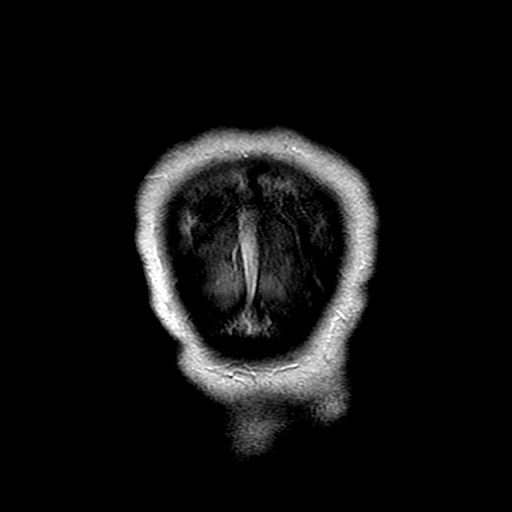

[Series 19: T1 post-contrast · sagittal · 5.0mm · 0.47mm/px · 2 of 24 slices shown (2 of 2)]
[im 1/24]
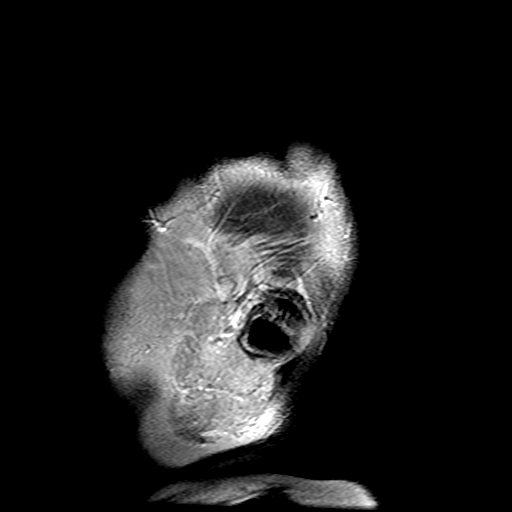
[im 24/24]
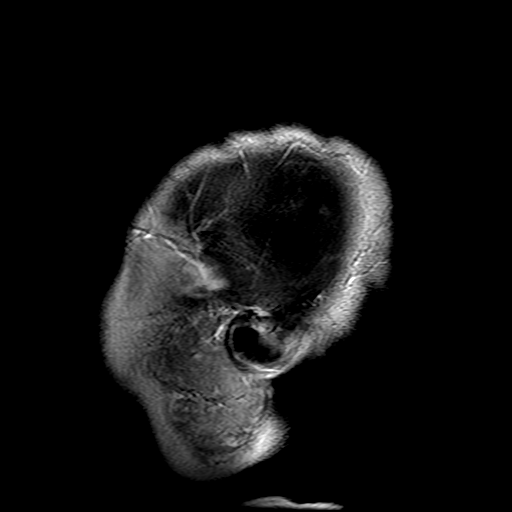

[Series 400: DWI · axial · 3.0mm · 1.09mm/px · z∈[-30,+124]mm · 3 of 53 slices shown (3 of 4)]
[im 1/53]
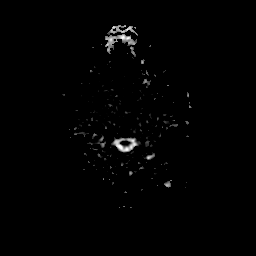
[im 27/53]
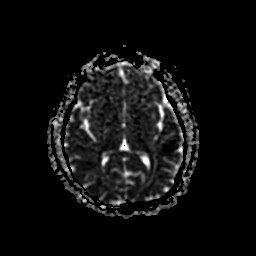
[im 53/53]
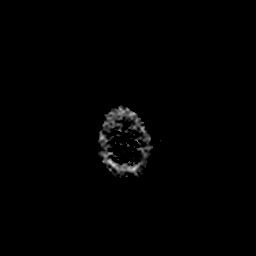

[Series 500: DWI · coronal · 5.0mm · 1.09mm/px · 2 of 31 slices shown (4 of 4)]
[im 1/31]
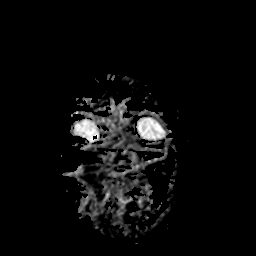
[im 31/31]
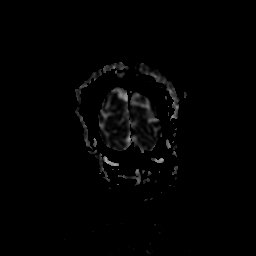

[27 of 48 positions shown; findings below may reference images not displayed]

FINDINGS: The patient was unable to remain motionless for the exam. Small or
subtle lesions could be overlooked.

MRI HEAD FINDINGS

Brain: No acute stroke, acute hemorrhage, hydrocephalus, or
extra-axial fluid. Slight premature for age atrophy. Minor white
matter disease, likely chronic microvascular ischemic change given
the patient's severe diabetes.

In the RIGHT CP angle, redemonstrated is a 7 x 18 x 13 mm en plaque
meningioma, anterior to the IAC. Slight mass effect on the brainstem
and contiguity with the RIGHT IAC, similar to 5653. This is
associated with dorsal dural clival thickening, see for instance
image 19 series 17. At the most superior aspect of the dural
thickening, there is a T1 hyperintense posterior intrasellar lesion,
roughly spherical, 8 mm diameter which does not display significant
postcontrast enhancement. This abuts the posterior chiasm without
significant displacement of such. All of these findings appear
essentially stable in comparison with 5653 MRI.

There is an intrasphenoid fat graft representing previous
transsphenoidal approach for removal of pituitary tumor. Within the
sella, no features suggestive of recurrent pituitary tumor.

Vascular: Flow voids are maintained throughout the carotid, basilar,
and vertebral arteries. There are no areas of chronic hemorrhage.

Skull and upper cervical spine: Cerebellar tonsils and upper
cervical region unremarkable. Normal marrow signal.

Sinuses/Orbits: Mild chronic sinus disease.  Negative orbits.

Other: None.

MRA HEAD FINDINGS

The internal carotid arteries are widely patent. The basilar artery
is patent but diminutive related to BILATERAL fetal PCA anatomy.
RIGHT vertebral is dominant.

Moderate irregularity of both the RIGHT and LEFT anterior cerebral
arteries in their A1 segments. Suspected 50% stenosis distal M1
segment LEFT MCA. Suspected 50% stenosis proximal M1 segment RIGHT
MCA. Moderate irregularity of the distal MCA and PCA branches
suggesting intracranial atherosclerotic change. No saccular
aneurysm. Motion degradation along with shortened acquisition time
makes cerebellar branch evaluation difficult.

No visible saccular aneurysm.
IMPRESSION: Within limits for assessment on this motion degraded exam, no acute
stroke is evident.

Mild atrophy and small vessel disease.

RIGHT CP angle meningioma, dorsal dural clival thickening, and
dorsal intrasellar subcentimeter T1 hyperintense mass lesion, all
stable from 5653; multiple contiguous meningiomas would be one
unifying hypothesis for this appearance. Alternately, the sellar
lesion could be unrelated, possible Rathke's cleft cyst.

Sequelae of trans-sphenoidal resection pituitary adenoma without
obvious recurrence.

Diffuse intracranial atherosclerotic disease, likely sequelae of
diabetes, without proximal large vessel occlusion or flow-limiting
intracranial stenosis.

## 2017-11-09 ENCOUNTER — Ambulatory Visit (HOSPITAL_COMMUNITY): Payer: 59

## 2017-11-12 ENCOUNTER — Ambulatory Visit (HOSPITAL_COMMUNITY): Payer: 59

## 2017-11-12 NOTE — Progress Notes (Signed)
Patient ID: Destiny Beasley, female   DOB: Aug 11, 1955, 62 y.o.   MRN: [id number]   Reason for Appointment: Type II Diabetes follow-up   History of Present Illness    Date of diagnosis: 10/2008  Previous history: She had markedly increased blood sugars at diagnosis and was tried on oral hypoglycemic drugs and Victoza for about 9 months before starting an insulin.  Since 2011 she had been on basal bolus insulin regimen Previously had had difficulty controlling her diabetes mostly because of difficulty with compliance with various aspects of self-care.  Previous regimen: Toujeo 60 units at 8 am . Apidra 20 units at times  She was started on the Omnipod insulin pump on 09/11/16  Recent history:   Insulin regimen:  Omnipod insulin pump settings currently: Basal rate midnight = 1.5, 8 AM = 2.3 and 5 PM = 2.0  Carbohydrate coverage 1:8 and active insulin time 4 hours with target 100 Sensitivity 1: 15  Non-insulin hypoglycemic drugs: Victoza 1.8 mg daily   Her A1c has been consistently high and is now 9 compared to 7.9 previously         Current management, blood sugar patterns and problems:  She was supposed to switch to the Humalog U-200 and her pump but has not done so  Although she thinks she was doing fairly well previously she is not bolusing on her pump temporarily when the blood sugars are very high  She does not do any mealtime boluses  Even though she has been instructed on use of the pump she is checking blood sugars only sporadically and mostly in the morning  As a result most of her blood sugars are high and averaging well over 200 in the evenings  Occasionally has done extra boluses late at night when blood sugars were high and the next morning her sugar would be in the 60s  She thinks she is taking her Victoza regularly  Also has started back on water aerobics but her weight has gone up since last month  She had stopped using the freestyle libre system  previously because of high cost  Side effects from medications: Candidiasis from Invokana  Glucose readings from download   FASTING range 60-262 Evening 184-385 with readings at variable times  OVERALL AVERAGE for the last 2 weeks 222 with average in the evening after 4 PM ranging from 260 up to 331  Exercise:  Water aerobics 3/7    Dietician visit: Most recent: 04/2010 Weight control:  Wt Readings from Last 3 Encounters:  11/13/17 247 lb 9.6 oz (112.3 kg)  10/02/17 241 lb (109.3 kg)  08/31/17 241 lb (109.3 kg)         Diabetes labs:  Lab Results  Component Value Date   HGBA1C 9.0 (H) 11/07/2017   HGBA1C 7.9 (H) 08/03/2017   HGBA1C 7.5 (H) 06/25/2017   Lab Results  Component Value Date   MICROALBUR <0.7 03/01/2017   LDLCALC 82 08/03/2017   CREATININE 0.96 11/07/2017      No visits with results within 1 Day(s) from this visit.  Latest known visit with results is:  Lab on 11/07/2017  Component Date Value Ref Range Status  . Sodium 11/07/2017 140  135 - 145 mEq/L Final  . Potassium 11/07/2017 4.5  3.5 - 5.1 mEq/L Final  . Chloride 11/07/2017 107  96 - 112 mEq/L Final  . CO2 11/07/2017 28  19 - 32 mEq/L Final  . Glucose, Bld 11/07/2017 183* 70 - 99  mg/dL Final  . BUN 11/07/2017 20  6 - 23 mg/dL Final  . Creatinine, Ser 11/07/2017 0.96  0.40 - 1.20 mg/dL Final  . Total Bilirubin 11/07/2017 0.2  0.2 - 1.2 mg/dL Final  . Alkaline Phosphatase 11/07/2017 110  39 - 117 U/L Final  . AST 11/07/2017 25  0 - 37 U/L Final  . ALT 11/07/2017 28  0 - 35 U/L Final  . Total Protein 11/07/2017 7.7  6.0 - 8.3 g/dL Final  . Albumin 11/07/2017 4.1  3.5 - 5.2 g/dL Final  . Calcium 11/07/2017 9.5  8.4 - 10.5 mg/dL Final  . GFR 11/07/2017 76.30  >60.00 mL/min Final  . Hgb A1c MFr Bld 11/07/2017 9.0* 4.6 - 6.5 % Final   Glycemic Control Guidelines for People with Diabetes:Non Diabetic:  <6%Goal of Therapy: <7%Additional Action Suggested:  >8%      Allergies as of 11/13/2017       Reactions   Hydrocodone Other (See Comments)   Severe anxiety Confusion   Invokana [canagliflozin] Other (See Comments)   Constant yeast infections   Atorvastatin Other (See Comments)   DIZZINESS      Medication List        Accurate as of 11/13/17  9:18 AM. Always use your most recent med list.          aspirin 81 MG EC tablet Take 1 tablet (81 mg total) by mouth daily.   Biotin 10000 MCG Tabs Take by mouth.   Coenzyme Q-10 100 MG capsule Take 100 mg by mouth daily.   DULERA 100-5 MCG/ACT Aero Generic drug:  mometasone-formoterol Inhale 2 puffs into the lungs 2 (two) times daily as needed for wheezing or shortness of breath (allergies).   Evolocumab 140 MG/ML Soaj Commonly known as:  REPATHA SURECLICK Inject 585 mg into the skin every 14 (fourteen) days.   furosemide 20 MG tablet Commonly known as:  LASIX Take 1 tablet (20 mg total) by mouth 2 (two) times daily.   Insulin Degludec 200 UNIT/ML Sopn Commonly known as:  TRESIBA FLEXTOUCH Inject 70 Units into the skin daily.   insulin lispro 100 UNIT/ML injection Commonly known as:  HUMALOG INJECT 80 UNITS UNDER THE SKIN DAILY   Insulin Pen Needle 31G X 5 MM Misc Commonly known as:  B-D UF III MINI PEN NEEDLES USE TO INJECT INSULIN FOUR TIMES DAILY   irbesartan 75 MG tablet Commonly known as:  AVAPRO Take 1 tablet (75 mg total) by mouth daily.   liraglutide 18 MG/3ML Sopn Commonly known as:  VICTOZA ADMINISTER 1.8 MG UNDER THE SKIN DAILY   MULTIPLE VITAMIN PO Take 1 tablet daily by mouth.   nitroGLYCERIN 0.4 MG SL tablet Commonly known as:  NITROSTAT Place 1 tablet (0.4 mg total) under the tongue every 5 (five) minutes as needed for chest pain (up to 3 doses. If taking 3rd dose call 911).   ticagrelor 90 MG Tabs tablet Commonly known as:  BRILINTA Take 1 tablet (90 mg total) by mouth 2 (two) times daily.   venlafaxine XR 150 MG 24 hr capsule Commonly known as:  EFFEXOR-XR Take 150 mg by mouth  daily.   VITAMIN D-3 PO Take by mouth.       Allergies:  Allergies  Allergen Reactions  . Hydrocodone Other (See Comments)    Severe anxiety Confusion   . Invokana [Canagliflozin] Other (See Comments)    Constant yeast infections  . Atorvastatin Other (See Comments)    DIZZINESS  Past Medical History:  Diagnosis Date  . Anemia   . Anxiety   . CAD in native artery    a. Inf STEMI 05/4095 complicated by cardiogenic shock/complete heart block s/p emergent cath showing culprit large dominant LCx s/p aspiration thrombectomy and DES, EF 50% by cath, 40-45% by echo.  . Complete heart block, transient ([place])    a. 06/2017 in setting of acute inf MI -> resolved after PCI.  . Diabetes mellitus   . Goiter   . History of radiation therapy 06/14/2016 - 07/19/2016   Right Breast 50 Gy 25 fractions  . Hypercholesteremia   . Hypertension   . Ischemic cardiomyopathy    a. EF 50% by cath and 40-45% by echo 06/2017.  . Malignant neoplasm of upper-outer quadrant of right female breast ([place]) 02/24/2016  . Meningioma ([place])   . Obesity   . Personal history of chemotherapy   . Personal history of radiation therapy   . Pituitary tumor   . Seasonal asthma   . Sleep apnea    does not use every night  . Stroke [facility])    TIA - Dec 1 st, 2017    Past Surgical History:  Procedure Laterality Date  . ABDOMINAL HYSTERECTOMY    . BRAIN MENINGIOMA EXCISION  2005  . BREAST BIOPSY    . BREAST LUMPECTOMY Right    2017  . BREAST LUMPECTOMY WITH RADIOACTIVE SEED LOCALIZATION Right 04/20/2016   Procedure: RIGHT BREAST LUMPECTOMY WITH RADIOACTIVE SEED LOCALIZATION;  Surgeon: Fanny Skates, MD;  Location: [facility];  Service: General;  Laterality: Right;  . BREAST REDUCTION SURGERY Bilateral 10/23/2016   Procedure: BILATERAL BREAST REDUCTION WITH LIPOSUCTION ASSISTANCE;  Surgeon: Cristine Polio, MD;  Location: [facility];  Service: Plastics;  Laterality: Bilateral;  .  CESAREAN SECTION    . CORONARY STENT INTERVENTION N/A 06/25/2017   Procedure: CORONARY STENT INTERVENTION;  Surgeon: Sherren Mocha, MD;  Location: [facility];  Service: Cardiovascular;  Laterality: N/A;  . CORONARY/GRAFT ACUTE MI REVASCULARIZATION N/A 06/25/2017   Procedure: Coronary/Graft Acute MI Revascularization;  Surgeon: Sherren Mocha, MD;  Location: [facility];  Service: Cardiovascular;  Laterality: N/A;  . FOOT SURGERY Bilateral 2016   hammer toe surgery  . LEFT HEART CATH AND CORONARY ANGIOGRAPHY N/A 06/25/2017   Procedure: LEFT HEART CATH AND CORONARY ANGIOGRAPHY;  Surgeon: Sherren Mocha, MD;  Location: [facility];  Service: Cardiovascular;  Laterality: N/A;  . MENISCUS REPAIR Left 2015  . PITUITARY SURGERY    . REDUCTION MAMMAPLASTY    . RIGHT HEART CATH N/A 06/25/2017   Procedure: RIGHT HEART CATH;  Surgeon: Sherren Mocha, MD;  Location: [facility];  Service: Cardiovascular;  Laterality: N/A;  . THYROID SURGERY      Family History  Problem Relation Age of Onset  . Diabetes Mother   . Hyperlipidemia Mother   . Hypertension Mother     Social History:  reports that she has never smoked. She has never used smokeless tobacco. She reports that she drinks about 0.6 oz of alcohol per week. She reports that she does not use drugs.  Review of Systems:  Hypertension:  Fair control, blood pressure is managed by cardiologist  Lipids: She was finally able to start [place] in 08/2017 Her diet is variable especially when she is traveling and eating out  Waiting for repeat labs Does that injection every 2 weeks without any side effects  Lab Results  Component Value Date  CHOL 142 08/03/2017   HDL 49.00 08/03/2017   LDLCALC 82 08/03/2017   TRIG 55.0 08/03/2017   CHOLHDL 3 08/03/2017   Lab Results  Component Value Date   ALT 28 11/07/2017    Diabetic shoe prescription has been given previously     Examination:   BP 128/84 (BP Location:  Left Arm, Patient Position: Sitting, Cuff Size: Normal)   Pulse 88   Ht 6' (1.829 m)   Wt 247 lb 9.6 oz (112.3 kg)   SpO2 98%   BMI 33.58 kg/m   Body mass index is 33.58 kg/m.     ASSESSMENT/ PLAN:    Diabetes type 2 with obesity See history of present illness for detailed discussion of current management, blood sugar patterns and problems identified  Her A1c is significantly high at 9%  Although previously she had done well with the insulin pump and getting most of her blood sugars down to goal she is not paying attention to her diabetes management much She is not thinking about doing boluses for her meals on a regular basis and has only done random boluses when blood sugars are very high traveling and eating out She is also not checking her sugars enough mornings She does well when she is eating very low carbohydrate meals at breakfast and lunch are not eating anything except snacks during the day but her blood sugars are higher after evening meal and snacks  She has been concerned about the cost of the Humalog and changing her pump supplies frequently because of using nearly 50 units basal insulin per day However has not tried to use the U-200 insulin pen She has started to exercise but has not lost any weight   Recommendations today:   She needs to watch her diet better She will start using Humalog U-200 to fill her pump Given her new doses will be about 50% of the basal and the carbohydrate coverage will be twice as much Emphasized the need to check her sugars consistently before every meal and take the bolus after entering carbohydrates She will also bolus for snacks Low-fat diet Take Victoza regularly  Review day today management with diabetes educator in 3 weeks She will continue using the old PDM since she does not want to use a separate unit for checking her sugar and bolusing  HYPERTENSION: Fair control.  She will continue to follow-up with PCP and  cardiologist   LIPIDS: To have lipids checked today, taking Repatha no  Counseling time on subjects discussed in assessment and plan sections is over 50% of today's 25 minute visit    Patient Instructions  Check his sugars a lot more consistently before at each meal Putting the blood sugar and carbohydrate at the time of the meal and then bolus what the pump tells you to   Elayne Snare 11/13/2017, 9:18 AM   Counseling time on subjects discussed in assessment and plan sections is over 50% of today's 25 minute visit

## 2017-11-13 ENCOUNTER — Telehealth: Payer: Self-pay | Admitting: Emergency Medicine

## 2017-11-13 ENCOUNTER — Encounter: Payer: Self-pay | Admitting: Endocrinology

## 2017-11-13 ENCOUNTER — Other Ambulatory Visit: Payer: Self-pay

## 2017-11-13 ENCOUNTER — Ambulatory Visit (INDEPENDENT_AMBULATORY_CARE_PROVIDER_SITE_OTHER): Payer: 59 | Admitting: Endocrinology

## 2017-11-13 VITALS — BP 128/84 | HR 88 | Ht 72.0 in | Wt 247.6 lb

## 2017-11-13 DIAGNOSIS — E782 Mixed hyperlipidemia: Secondary | ICD-10-CM | POA: Diagnosis not present

## 2017-11-13 DIAGNOSIS — Z794 Long term (current) use of insulin: Secondary | ICD-10-CM | POA: Diagnosis not present

## 2017-11-13 DIAGNOSIS — E1165 Type 2 diabetes mellitus with hyperglycemia: Secondary | ICD-10-CM

## 2017-11-13 LAB — LIPID PANEL
CHOLESTEROL: 129 mg/dL (ref 0–200)
HDL: 48.4 mg/dL (ref >39.00)
LDL CALC: 60 mg/dL (ref 0–99)
NonHDL: 81.08
Total CHOL/HDL Ratio: 3
Triglycerides: 104 mg/dL (ref 0.0–149.0)
VLDL: 20.8 mg/dL (ref 0.0–40.0)

## 2017-11-13 MED ORDER — INSULIN LISPRO 200 UNIT/ML ~~LOC~~ SOPN: 20.0000 [IU] | PEN_INJECTOR | Freq: Three times a day (TID) | SUBCUTANEOUS | 3 refills | Status: DC

## 2017-11-13 NOTE — Telephone Encounter (Signed)
FYI Pt didn't want to schedule appt at checkout. Please call patient at later date to schedule. Thanks

## 2017-11-13 NOTE — Patient Instructions (Signed)
Check his sugars a lot more consistently before at each meal Putting the blood sugar and carbohydrate at the time of the meal and then bolus what the pump tells you to

## 2017-11-14 ENCOUNTER — Ambulatory Visit (HOSPITAL_COMMUNITY): Payer: 59

## 2017-11-16 ENCOUNTER — Ambulatory Visit (HOSPITAL_COMMUNITY): Payer: 59

## 2017-11-19 ENCOUNTER — Ambulatory Visit (HOSPITAL_COMMUNITY): Payer: 59

## 2017-11-21 ENCOUNTER — Ambulatory Visit (HOSPITAL_COMMUNITY): Payer: 59

## 2017-11-21 ENCOUNTER — Encounter: Payer: Self-pay | Admitting: Physician Assistant

## 2017-11-21 NOTE — Telephone Encounter (Signed)
Message left on machines of cell phone and home phone, to call me to set up an appointment to change settings on pump, before starting the U-200 insulin in her pump.  Also reminded her of need to test blood sugars and bolus before all meals eaten.  Telephone number left for her to call me.

## 2017-11-22 ENCOUNTER — Other Ambulatory Visit: Payer: Self-pay | Admitting: Endocrinology

## 2017-11-22 ENCOUNTER — Telehealth: Payer: Self-pay | Admitting: *Deleted

## 2017-11-22 NOTE — Telephone Encounter (Signed)
Patient called in to say that she had a sleep study done over a 1 month ago and have not received any results from it. After reviewing her chart  I see that Dr Claiborne Billings read the study on 10/07/17, however he never sent the results and recommendations to me. I apologized to the patient and she was given the results and recommendations of the BIPAP titration study. Referral was sent to [facility] for BIPAP machine. (they are the only [facility] that takes her insurance.)

## 2017-11-23 ENCOUNTER — Ambulatory Visit (HOSPITAL_COMMUNITY): Payer: 59

## 2017-11-26 ENCOUNTER — Ambulatory Visit (HOSPITAL_COMMUNITY): Payer: 59

## 2017-11-28 ENCOUNTER — Ambulatory Visit (HOSPITAL_COMMUNITY): Payer: 59

## 2017-11-30 ENCOUNTER — Ambulatory Visit (HOSPITAL_COMMUNITY): Payer: 59

## 2017-11-30 ENCOUNTER — Other Ambulatory Visit: Payer: Self-pay

## 2017-11-30 MED ORDER — INSULIN LISPRO 200 UNIT/ML ~~LOC~~ SOPN: 20.0000 [IU] | PEN_INJECTOR | Freq: Three times a day (TID) | SUBCUTANEOUS | 1 refills | Status: DC

## 2017-12-02 IMAGING — MG DIAGNOSTIC NEEDLE LOC MAMMO RIGHT
6 series · 6 of 6 positions shown · non-contrast
Comparison: Previous exam(s).

CLINICAL DATA: Patient presents for radioactive seed localization
of a right breast carcinoma.

EXAM:
MAMMOGRAPHIC GUIDED RADIOACTIVE SEED LOCALIZATION OF THE RIGHT
BREAST

[R CC (1 of 3)]
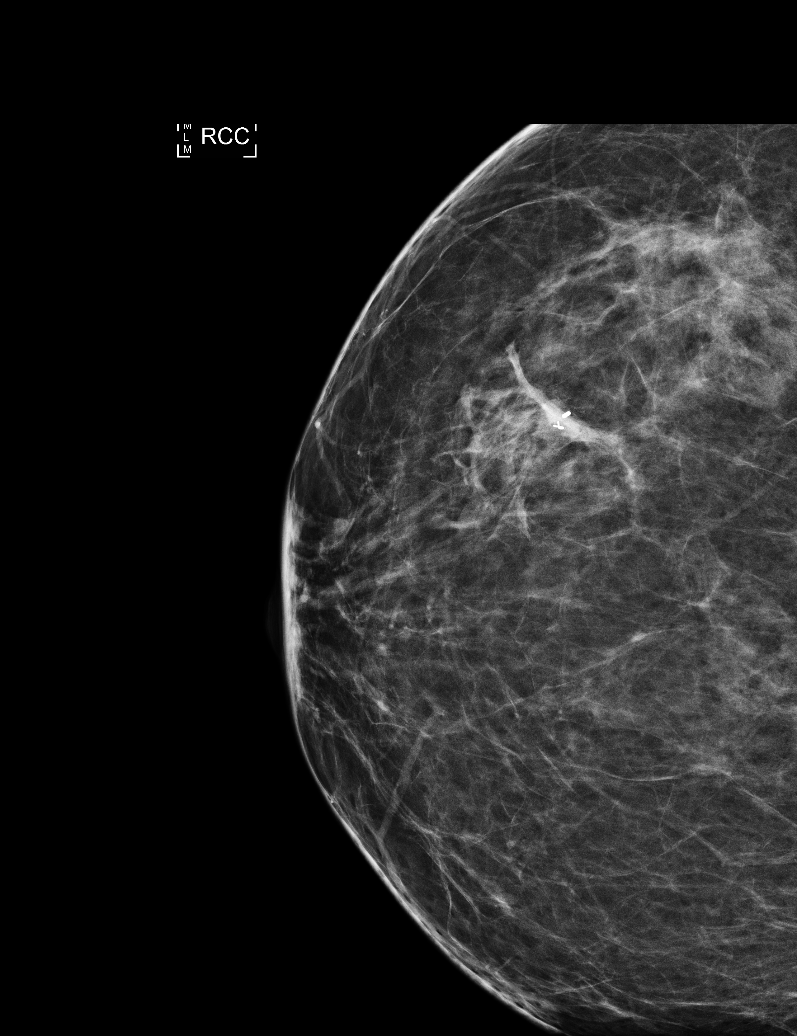

[R ML (1 of 3)]
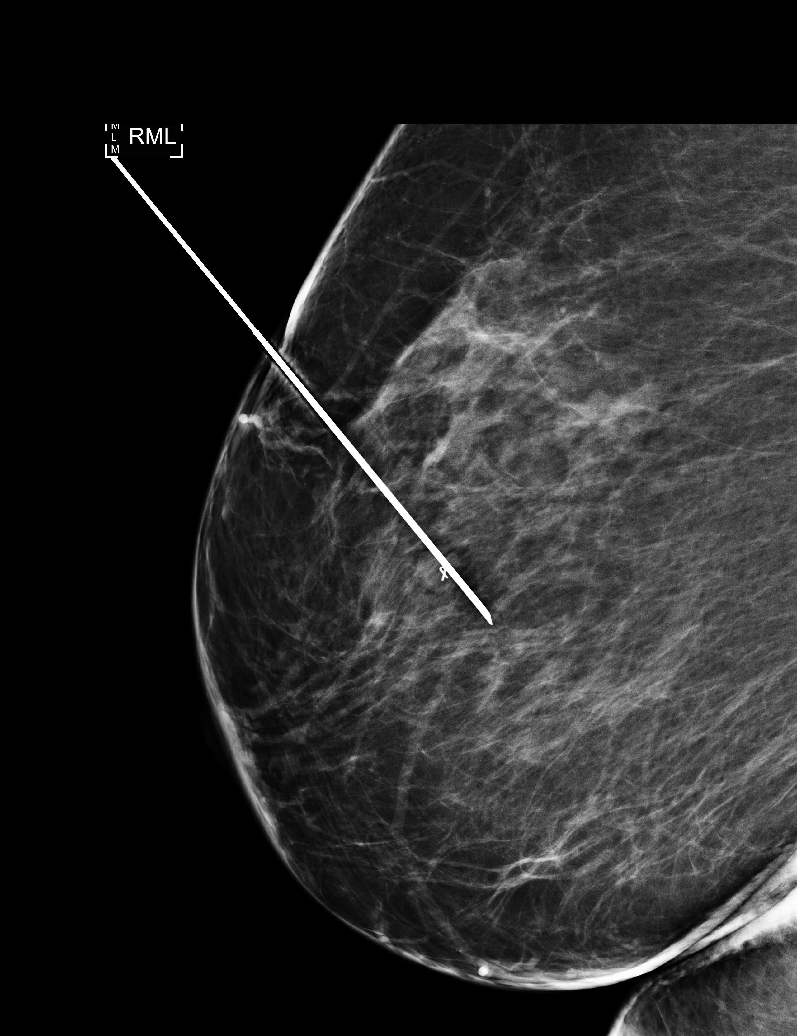

[R CC (2 of 3)]
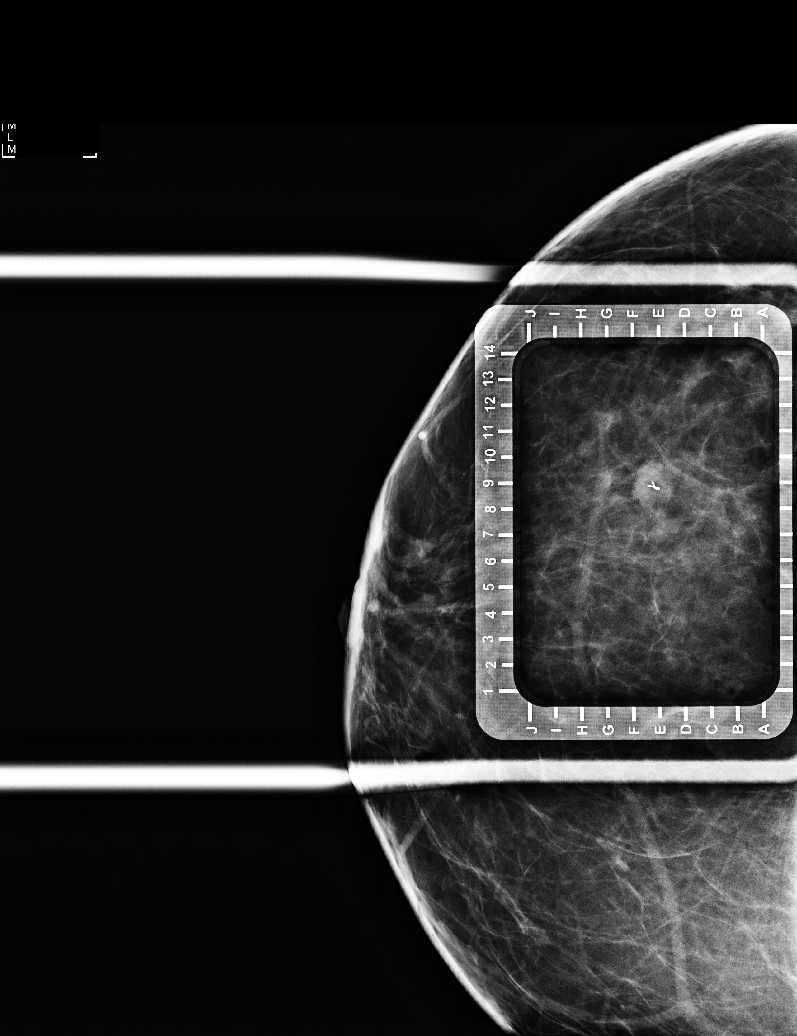

[R ML (2 of 3)]
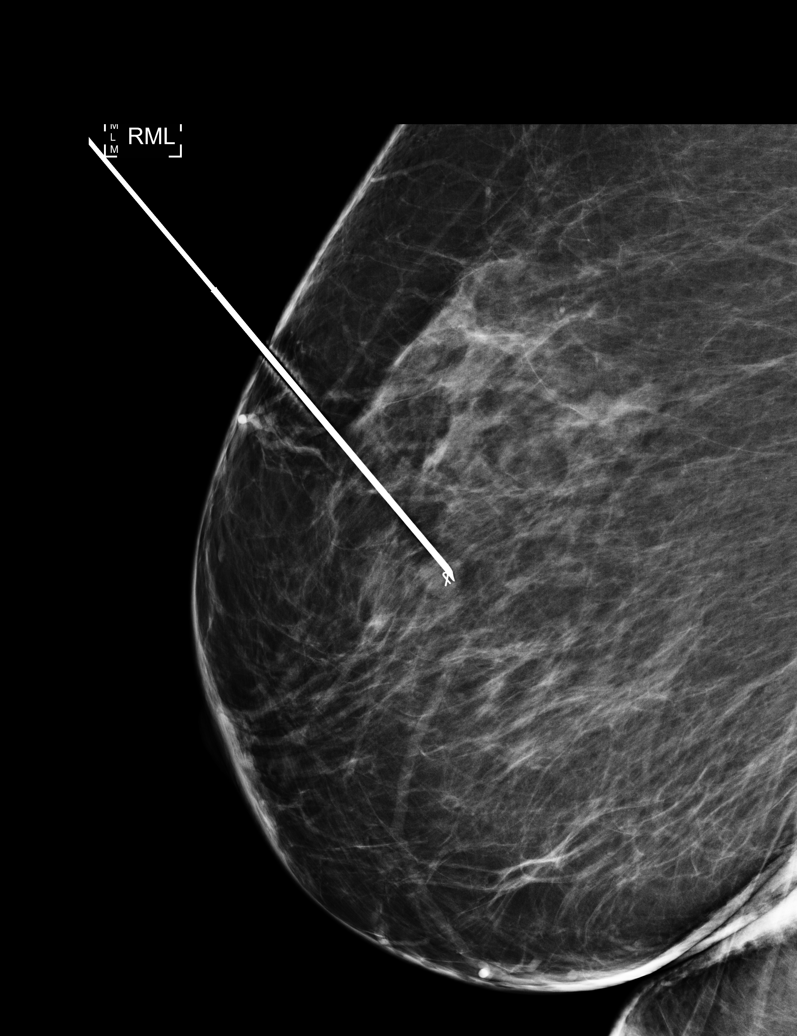

[R ML (3 of 3)]
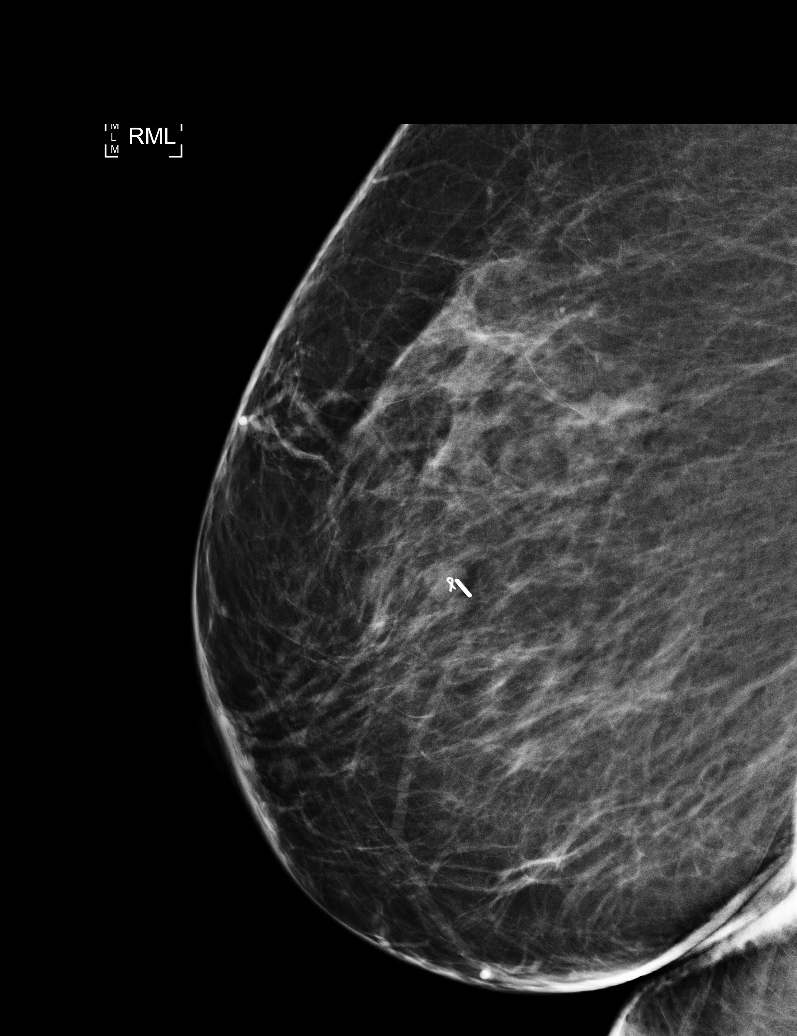

[R CC (3 of 3)]
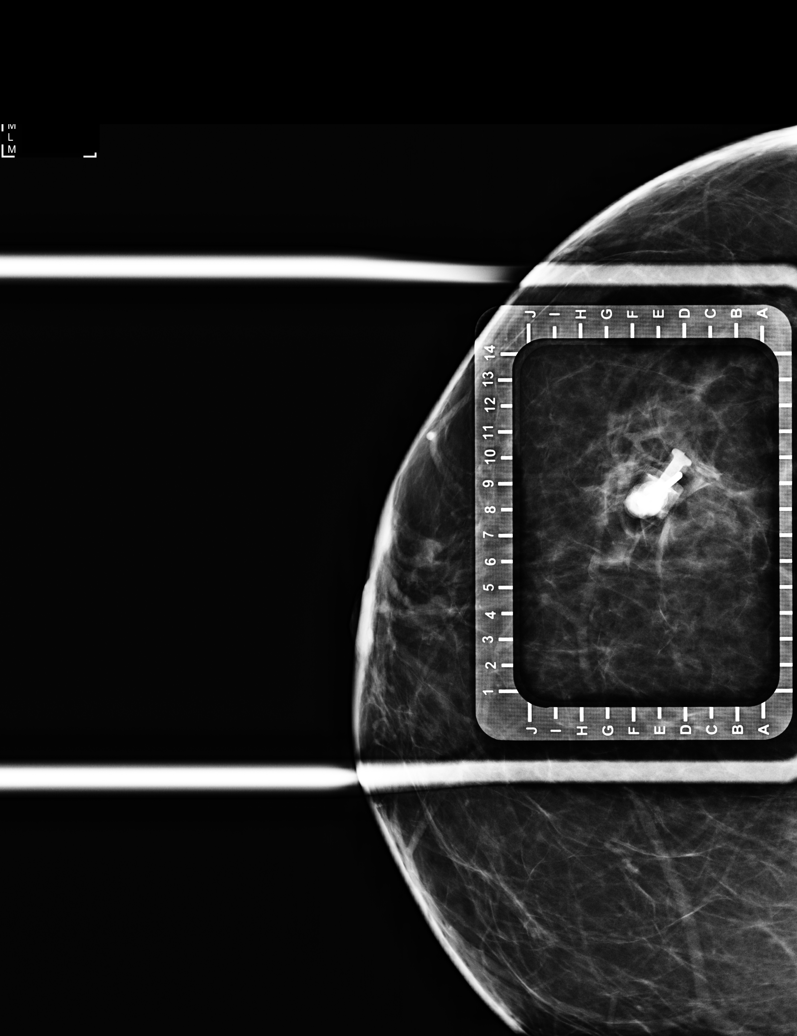

[6 of 6 positions shown; findings below may reference images not displayed]

FINDINGS: Patient presents for radioactive seed localization prior to surgical
excision. I met with the patient and we discussed the procedure of
seed localization including benefits and alternatives. We discussed
the high likelihood of a successful procedure. We discussed the
risks of the procedure including infection, bleeding, tissue injury
and further surgery. We discussed the low dose of radioactivity
involved in the procedure. Informed, written consent was given.

The usual time-out protocol was performed immediately prior to the
procedure.

Using mammographic guidance, sterile technique, 1% lidocaine and an
Y-YNK radioactive seed, the mass and biopsy clip was localized using
a cranial to caudal approach. The follow-up mammogram images confirm
the seed in the expected location and were marked for Dr. Klever.

Follow-up survey of the patient confirms presence of the radioactive
seed.

Order number of Y-YNK seed:  [id number].

Total activity:  0.252 millicuries  Reference Date: 03/31/2016

The patient tolerated the procedure well and was released from the
[REDACTED]. She was given instructions regarding seed removal.
IMPRESSION: Radioactive seed localization right breast. No apparent
complications.

## 2017-12-03 ENCOUNTER — Ambulatory Visit (HOSPITAL_COMMUNITY): Payer: 59

## 2017-12-04 IMAGING — MG BREAST SURGICAL SPECIMEN
1 series · 1 of 1 positions shown · non-contrast
Comparison: Previous exam(s).

CLINICAL DATA: Evaluate specimen

EXAM:
SPECIMEN RADIOGRAPH OF THE RIGHT BREAST

[R]
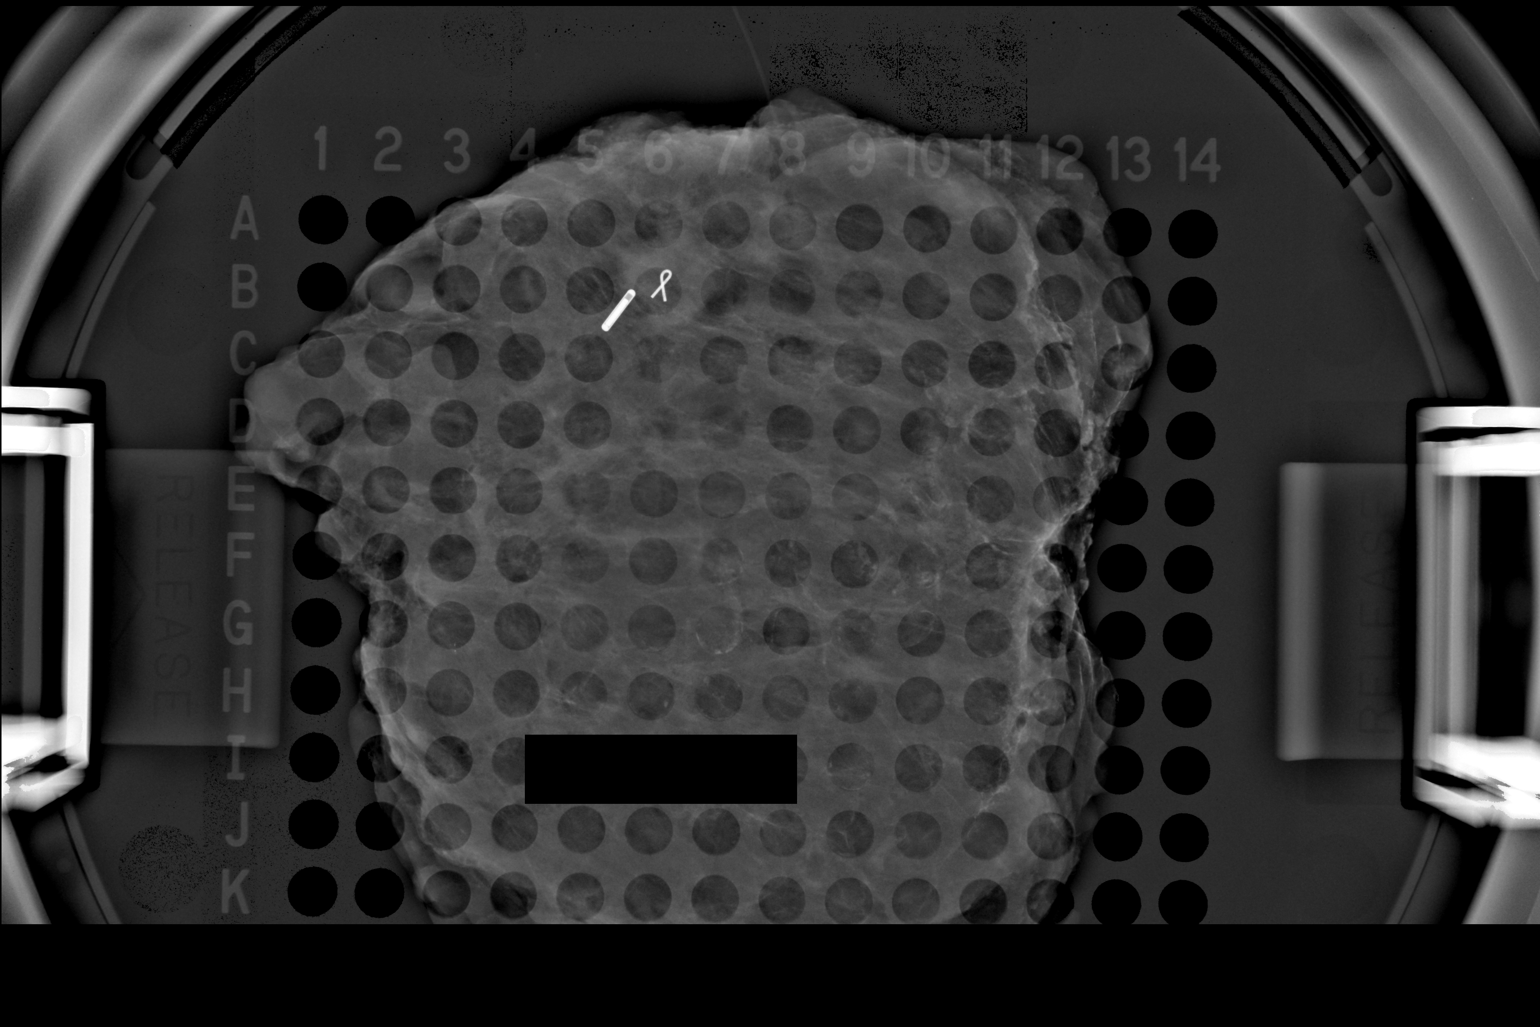

[1 of 1 positions shown; findings below may reference images not displayed]

FINDINGS: Status post excision of the right breast. The radioactive seed and
biopsy marker clip are present, completely intact, and were marked
for pathology.
IMPRESSION: Specimen radiograph of the right breast.

## 2017-12-05 ENCOUNTER — Ambulatory Visit (HOSPITAL_COMMUNITY): Payer: 59

## 2017-12-07 ENCOUNTER — Ambulatory Visit (HOSPITAL_COMMUNITY): Payer: 59

## 2017-12-10 ENCOUNTER — Ambulatory Visit (HOSPITAL_COMMUNITY): Payer: 59

## 2017-12-12 ENCOUNTER — Ambulatory Visit (HOSPITAL_COMMUNITY): Payer: 59

## 2017-12-12 ENCOUNTER — Other Ambulatory Visit: Payer: Self-pay | Admitting: Endocrinology

## 2017-12-14 ENCOUNTER — Ambulatory Visit (HOSPITAL_COMMUNITY): Payer: 59

## 2017-12-17 ENCOUNTER — Telehealth: Payer: Self-pay | Admitting: Endocrinology

## 2017-12-17 ENCOUNTER — Ambulatory Visit (HOSPITAL_COMMUNITY): Payer: 59

## 2017-12-17 NOTE — Telephone Encounter (Signed)
Insulin Lispro (HUMALOG KWIKPEN) 200 UNIT/ML SOPN   Keith from [facility] is calling in regards to patient who is very addiment she is on the pump but pharmacist is stating we sent over a fax that patient IS NOT on a pump He would like clarification before he discuss this with the patient    Ph- [phone]

## 2017-12-17 NOTE — Telephone Encounter (Signed)
She will need to fill her pump with the Humalog U-200.  This has been prescribed earlier this month.  If she does not know how to do this she will talk to [facility].

## 2017-12-17 NOTE — Telephone Encounter (Signed)
keko from [facility] called to state patient is asking for insulin for her pump but we sent over a note stating the patient is not using an insulin pump- please advise on if she is using pump and what the prescription would be since it is not in her chart

## 2017-12-18 ENCOUNTER — Other Ambulatory Visit: Payer: Self-pay

## 2017-12-18 MED ORDER — INSULIN LISPRO 200 UNIT/ML ~~LOC~~ SOPN: 20.0000 [IU] | PEN_INJECTOR | Freq: Three times a day (TID) | SUBCUTANEOUS | 3 refills | Status: DC

## 2017-12-18 MED ORDER — INSULIN LISPRO 200 UNIT/ML ~~LOC~~ SOPN: 70.0000 [IU] | PEN_INJECTOR | Freq: Three times a day (TID) | SUBCUTANEOUS | 5 refills | Status: DC

## 2017-12-19 ENCOUNTER — Other Ambulatory Visit: Payer: Self-pay

## 2017-12-19 ENCOUNTER — Ambulatory Visit (HOSPITAL_COMMUNITY): Payer: 59

## 2017-12-19 ENCOUNTER — Telehealth: Payer: Self-pay | Admitting: Emergency Medicine

## 2017-12-19 NOTE — Telephone Encounter (Signed)
Spoke with [facility] at pharmacy this has been resolved

## 2017-12-19 NOTE — Telephone Encounter (Signed)
Pt called and stated she only got 3 pens of the Humalog 200 when she should have gotten 9 pens. She has run out of insulin needed for her pods. The pharmancy stated the wording is incorrect to allow for 9 pens. Can you please give her a call back so she can get this fixed. Thanks.

## 2017-12-21 ENCOUNTER — Ambulatory Visit (HOSPITAL_COMMUNITY): Payer: 59

## 2017-12-25 NOTE — Telephone Encounter (Signed)
Called and left Vm for pt to return call to office.  

## 2017-12-26 ENCOUNTER — Ambulatory Visit (HOSPITAL_COMMUNITY): Payer: 59

## 2017-12-28 ENCOUNTER — Other Ambulatory Visit: Payer: Self-pay | Admitting: Endocrinology

## 2017-12-31 ENCOUNTER — Encounter (HOSPITAL_COMMUNITY): Payer: Self-pay | Admitting: Emergency Medicine

## 2017-12-31 ENCOUNTER — Telehealth: Payer: Self-pay | Admitting: Cardiovascular Disease

## 2017-12-31 ENCOUNTER — Other Ambulatory Visit: Payer: Self-pay

## 2017-12-31 ENCOUNTER — Emergency Department (HOSPITAL_COMMUNITY)
Admission: EM | Admit: 2017-12-31 | Discharge: 2017-12-31 | Disposition: A | Discharge status: Home / Self Care | Payer: 59 | Attending: Emergency Medicine | Admitting: Emergency Medicine

## 2017-12-31 DIAGNOSIS — R072 Precordial pain: Secondary | ICD-10-CM | POA: Insufficient documentation

## 2017-12-31 DIAGNOSIS — Z5321 Procedure and treatment not carried out due to patient leaving prior to being seen by health care provider: Secondary | ICD-10-CM | POA: Insufficient documentation

## 2017-12-31 LAB — CBC
HEMATOCRIT: 42.8 % (ref 36.0–46.0)
HEMOGLOBIN: 13.5 g/dL (ref 12.0–15.0)
MCH: 28.8 pg (ref 26.0–34.0)
MCHC: 31.5 g/dL (ref 30.0–36.0)
MCV: 91.3 fL (ref 78.0–100.0)
Platelets: 353 10*3/uL (ref 150–400)
RBC: 4.69 MIL/uL (ref 3.87–5.11)
RDW: 13.5 % (ref 11.5–15.5)
WBC: 6.8 10*3/uL (ref 4.0–10.5)

## 2017-12-31 LAB — BASIC METABOLIC PANEL
ANION GAP: 11 (ref 5–15)
BUN: 16 mg/dL (ref 6–20)
CO2: 28 mmol/L (ref 22–32)
Calcium: 9.7 mg/dL (ref 8.9–10.3)
Chloride: 103 mmol/L (ref 98–111)
Creatinine, Ser: 0.92 mg/dL (ref 0.44–1.00)
GFR calc Af Amer: >60 mL/min (ref >60)
Glucose, Bld: 80 mg/dL (ref 70–99)
POTASSIUM: 4 mmol/L (ref 3.5–5.1)
Sodium: 142 mmol/L (ref 135–145)

## 2017-12-31 LAB — I-STAT TROPONIN, ED: Troponin i, poc: 0.01 ng/mL (ref 0.00–0.08)

## 2017-12-31 NOTE — ED Provider Notes (Signed)
MSE was initiated and I personally evaluated the patient and placed orders (if any) at  2:48 PM on December 31, 2017.  The patient appears stable so that the remainder of the MSE may be completed by another provider.  Patient placed in Quick Look pathway, seen and evaluated   Chief Complaint: CP  HPI:    HX OF CAD and MI. Onset yesterday afternoon. Pressure like, radiates to neck and left shoulder. Feels the same as her previous MI. The patient took NTG x3 yesterday +ASA Today took 1NTG at 2:00pm brought her pain from 10/10 to 7/10. No SOB.   ROS: +CP (one)  Physical Exam:   Gen: No distress  Neuro: Awake and Alert  Skin: Warm    Focused Exam: RRR, Lungs CTAB (marphanoid body habitus)   Initiation of care has begun. The patient has been counseled on the process, plan, and necessity for staying for the completion/evaluation, and the remainder of the medical screening examination    Margarita Mail, PA-C 12/31/17 1450    Long, [facility], MD 01/01/18 2013

## 2017-12-31 NOTE — Telephone Encounter (Signed)
Spoke to patient who is presently in the ED and was wondering if we knew whether or not she was having a heart attack from her EKG.  I told her that they would update her as they worked her up with labs, tests, etc.  She verbalized understanding.

## 2017-12-31 NOTE — Telephone Encounter (Signed)
Chart reviewed. Looks like she left the ER without being seen. Troponin negative and EKG non-acute. No sign of a heart attack.

## 2017-12-31 NOTE — Telephone Encounter (Signed)
New Message:     Pt is calling and states she is currently in the  hosp and she just had an ekg and she wants to know if we can let her know if she had a heart attack

## 2017-12-31 NOTE — ED Triage Notes (Signed)
Pt had MI in march, has been having chest pain and shoulder pain since yesterday, took 3 sl nitro yesterday and 3 sl nitro today. Last nitro was at 1400, pain has improved since nitro.

## 2017-12-31 NOTE — ED Notes (Addendum)
Pt states she can't wait any longer for ready room. States CP has improved. Spoke with her cardiologist Dr. Burt Knack who stated he would review her EKG. Pt seen leaving ED with family.

## 2018-01-08 NOTE — Telephone Encounter (Signed)
Please call patient (see message). She will need an appointment 3-4 weeks before her procedure. We also need notes from the surgeon that will do the procedure. If the procedure needs to be done with her off of Aspirin or Brilinta, it will need to be postponed until after March 2020. Richardson Dopp, PA-C    01/08/2018 4:51 PM

## 2018-01-09 ENCOUNTER — Other Ambulatory Visit: Payer: Self-pay

## 2018-01-09 MED ORDER — EVOLOCUMAB 140 MG/ML ~~LOC~~ SOAJ: 140.0000 mg | SUBCUTANEOUS | 3 refills | Status: DC

## 2018-01-09 NOTE — Telephone Encounter (Signed)
I s/w Destiny Beasley in regards to her procedure she is having in Dec 2019. Destiny Beasley had lumpectomy and is needing to have more fat tissue injected into the area of lumpectomy. Destiny Beasley has been advised she is not able to stop or hold the ASA and Brilinta per her Cardiologist until 06/2018. Asked the Destiny Beasley whho is the surgeon, Destiny Beasley answers Dr. Towanda Malkin. I advised Destiny Beasley per PA we will obtain Dr. Towanda Malkin office notes for further information. I advised Destiny Beasley once everything has been reviewed we can go ahead and make her an appt for her cardiac clearance. Destiny Beasley thanked me for the call.

## 2018-01-16 ENCOUNTER — Other Ambulatory Visit (INDEPENDENT_AMBULATORY_CARE_PROVIDER_SITE_OTHER): Payer: 59

## 2018-01-16 DIAGNOSIS — E1165 Type 2 diabetes mellitus with hyperglycemia: Secondary | ICD-10-CM

## 2018-01-16 DIAGNOSIS — Z794 Long term (current) use of insulin: Secondary | ICD-10-CM

## 2018-01-16 LAB — BASIC METABOLIC PANEL
BUN: 20 mg/dL (ref 6–23)
CALCIUM: 9.3 mg/dL (ref 8.4–10.5)
CO2: 30 meq/L (ref 19–32)
CREATININE: 0.82 mg/dL (ref 0.40–1.20)
Chloride: 106 mEq/L (ref 96–112)
GFR: 91.46 mL/min (ref >60.00)
GLUCOSE: 104 mg/dL — AB (ref 70–99)
Potassium: 3.9 mEq/L (ref 3.5–5.1)
Sodium: 142 mEq/L (ref 135–145)

## 2018-01-17 ENCOUNTER — Telehealth: Payer: Self-pay | Admitting: *Deleted

## 2018-01-17 LAB — FRUCTOSAMINE: FRUCTOSAMINE: 298 umol/L — AB (ref 0–285)

## 2018-01-17 NOTE — Telephone Encounter (Signed)
See recent MY CHART messages in regards to needing clearance for upcoming procedure. Left message to call the office as that she will need an appt before we can clear her. She will need to schedule her an appt 3-4 weeks before her procedure.

## 2018-01-18 NOTE — Telephone Encounter (Signed)
Pt has appt to see Richardson Dopp, PA 03/15/18 for pre op clearance

## 2018-01-20 NOTE — Progress Notes (Signed)
Patient ID: Destiny Beasley, female   DOB: Feb 22, 1956, 62 y.o.   MRN: [id number]   Reason for Appointment: Type II Diabetes follow-up   History of Present Illness    Date of diagnosis: 10/2008  Previous history: She had markedly increased blood sugars at diagnosis and was tried on oral hypoglycemic drugs and Victoza for about 9 months before starting an insulin.  Since 2011 she had been on basal bolus insulin regimen Previously had had difficulty controlling her diabetes mostly because of difficulty with compliance with various aspects of self-care.  Previous regimen: Toujeo 60 units at 8 am . Apidra 20 units at times  She was started on the Omnipod insulin pump on 09/11/16  Recent history:   Insulin regimen:  Omnipod insulin pump settings currently: Basal rates: 12 AM = 0.6, 10 AM = 1.0, 3 PM = 1.3 and 10 PM = 0.6  Carbohydrate coverage 1: 20 and active insulin time 4 hours with target 100 Sensitivity 1: 15  Non-insulin hypoglycemic drugs: Victoza 1.8 mg daily   Her A1c has been consistently high and last 9%  Fructosamine is mildly increased at 298         Current management, blood sugar patterns and problems:  She now says that she is using the Humalog U-200 insulin but unable to get enough to last the whole month with one box of pens  With this her blood sugars have been very erratic and when she is using the pump she is not frequently doing any boluses to conserve insulin  Otherwise she is using the [place] only and may take up to 110 units daily in divided doses without consulting [place]  With this she would be having tendency to hypoglycemia overnight  Overall checking blood sugars very sporadically and mostly overnight and in the mornings  Blood sugars are averaging 160s for the last 2 weeks  Previously was having issues with the cost of the Omnipod supplies when she was using the Humalog U-100 insulin  She had stopped using the freestyle libre system  previously because of high cost but she thinks that Dexcom is covered  She does take the Victoza usually regularly as she thinks it helps control her appetite better  Side effects from medications: Candidiasis from Invokana  Glucose readings from download as above No consistent pattern seen on her monitor download  Exercise:  Water aerobics 3/7    Dietician visit: Most recent: 04/2010 Weight control:  Wt Readings from Last 3 Encounters:  01/21/18 252 lb (114.3 kg)  11/13/17 247 lb 9.6 oz (112.3 kg)  10/02/17 241 lb (109.3 kg)         Diabetes labs:  Lab Results  Component Value Date   HGBA1C 9.0 (H) 11/07/2017   HGBA1C 7.9 (H) 08/03/2017   HGBA1C 7.5 (H) 06/25/2017   Lab Results  Component Value Date   MICROALBUR <0.7 03/01/2017   LDLCALC 60 11/13/2017   CREATININE 0.82 01/16/2018      No visits with results within 1 Day(s) from this visit.  Latest known visit with results is:  Appointment on 01/16/2018  Component Date Value Ref Range Status  . Fructosamine 01/16/2018 298* 0 - 285 umol/L Final   Comment: Published reference interval for apparently healthy subjects between age 53 and 53 is 42 - 285 umol/L and in a poorly controlled diabetic population is 228 - 563 umol/L with a mean of 396 umol/L.   Marland Kitchen Sodium 01/16/2018 142  135 - 145 mEq/L  Final  . Potassium 01/16/2018 3.9  3.5 - 5.1 mEq/L Final  . Chloride 01/16/2018 106  96 - 112 mEq/L Final  . CO2 01/16/2018 30  19 - 32 mEq/L Final  . Glucose, Bld 01/16/2018 104* 70 - 99 mg/dL Final  . BUN 01/16/2018 20  6 - 23 mg/dL Final  . Creatinine, Ser 01/16/2018 0.82  0.40 - 1.20 mg/dL Final  . Calcium 01/16/2018 9.3  8.4 - 10.5 mg/dL Final  . GFR 01/16/2018 91.46  >60.00 mL/min Final     Allergies as of 01/21/2018      Reactions   Hydrocodone Other (See Comments)   Severe anxiety Confusion   Invokana [canagliflozin] Other (See Comments)   Constant yeast infections   Atorvastatin Other (See Comments)    DIZZINESS      Medication List        Accurate as of 01/21/18 12:02 PM. Always use your most recent med list.          aspirin 81 MG EC tablet Take 1 tablet (81 mg total) by mouth daily.   Biotin 10000 MCG Tabs Take by mouth.   Coenzyme Q-10 100 MG capsule Take 100 mg by mouth daily.   DULERA 100-5 MCG/ACT Aero Generic drug:  mometasone-formoterol Inhale 2 puffs into the lungs 2 (two) times daily as needed for wheezing or shortness of breath (allergies).   Evolocumab 140 MG/ML Soaj Inject 140 mg into the skin every 14 (fourteen) days.   FREESTYLE TEST STRIPS test strip Generic drug:  glucose blood USE THREE TIMES DAILY TO CHECK BLOOD SUGAR   furosemide 20 MG tablet Commonly known as:  LASIX Take 1 tablet (20 mg total) by mouth 2 (two) times daily.   Insulin Degludec 200 UNIT/ML Sopn Inject 70 Units into the skin daily.   Insulin Lispro 200 UNIT/ML Sopn Inject 70 Units into the skin 3 (three) times daily.   Insulin Pen Needle 31G X 5 MM Misc USE TO INJECT INSULIN FOUR TIMES DAILY   irbesartan 75 MG tablet Commonly known as:  AVAPRO Take 1 tablet (75 mg total) by mouth daily.   liraglutide 18 MG/3ML Sopn Commonly known as:  VICTOZA ADMINISTER 1.8 MG UNDER THE SKIN EVERY DAY   MULTIPLE VITAMIN PO Take 1 tablet daily by mouth.   nitroGLYCERIN 0.4 MG SL tablet Commonly known as:  NITROSTAT Place 1 tablet (0.4 mg total) under the tongue every 5 (five) minutes as needed for chest pain (up to 3 doses. If taking 3rd dose call 911).   ticagrelor 90 MG Tabs tablet Commonly known as:  BRILINTA Take 1 tablet (90 mg total) by mouth 2 (two) times daily.   venlafaxine XR 150 MG 24 hr capsule Commonly known as:  EFFEXOR-XR Take 150 mg by mouth daily.   VITAMIN D-3 PO Take by mouth.       Allergies:  Allergies  Allergen Reactions  . Hydrocodone Other (See Comments)    Severe anxiety Confusion   . Invokana [Canagliflozin] Other (See Comments)    Constant  yeast infections  . Atorvastatin Other (See Comments)    DIZZINESS    Past Medical History:  Diagnosis Date  . Anemia   . Anxiety   . CAD in native artery    a. Inf STEMI 04/6107 complicated by cardiogenic shock/complete heart block s/p emergent cath showing culprit large dominant LCx s/p aspiration thrombectomy and DES, EF 50% by cath, 40-45% by echo.  . Complete heart block, transient ([place])  a. 06/2017 in setting of acute inf MI -> resolved after PCI.  . Diabetes mellitus   . Goiter   . History of radiation therapy 06/14/2016 - 07/19/2016   Right Breast 50 Gy 25 fractions  . Hypercholesteremia   . Hypertension   . Ischemic cardiomyopathy    a. EF 50% by cath and 40-45% by echo 06/2017.  . Malignant neoplasm of upper-outer quadrant of right female breast ([place]) 02/24/2016  . Meningioma ([place])   . Obesity   . Personal history of chemotherapy   . Personal history of radiation therapy   . Pituitary tumor   . Seasonal asthma   . Sleep apnea    does not use every night  . Stroke [facility])    TIA - Dec 1 st, 2017    Past Surgical History:  Procedure Laterality Date  . ABDOMINAL HYSTERECTOMY    . BRAIN MENINGIOMA EXCISION  2005  . BREAST BIOPSY    . BREAST LUMPECTOMY Right    2017  . BREAST LUMPECTOMY WITH RADIOACTIVE SEED LOCALIZATION Right 04/20/2016   Procedure: RIGHT BREAST LUMPECTOMY WITH RADIOACTIVE SEED LOCALIZATION;  Surgeon: Fanny Skates, MD;  Location: [place];  Service: General;  Laterality: Right;  . BREAST REDUCTION SURGERY Bilateral 10/23/2016   Procedure: BILATERAL BREAST REDUCTION WITH LIPOSUCTION ASSISTANCE;  Surgeon: Cristine Polio, MD;  Location: [place];  Service: Plastics;  Laterality: Bilateral;  . CESAREAN SECTION    . CORONARY STENT INTERVENTION N/A 06/25/2017   Procedure: CORONARY STENT INTERVENTION;  Surgeon: Sherren Mocha, MD;  Location: [place] CV [facility];  Service: Cardiovascular;  Laterality: N/A;  . CORONARY/GRAFT  ACUTE MI REVASCULARIZATION N/A 06/25/2017   Procedure: Coronary/Graft Acute MI Revascularization;  Surgeon: Sherren Mocha, MD;  Location: [place] CV LAB;  Service: Cardiovascular;  Laterality: N/A;  . FOOT SURGERY Bilateral 2016   hammer toe surgery  . LEFT HEART CATH AND CORONARY ANGIOGRAPHY N/A 06/25/2017   Procedure: LEFT HEART CATH AND CORONARY ANGIOGRAPHY;  Surgeon: Sherren Mocha, MD;  Location: [place] CV LAB;  Service: Cardiovascular;  Laterality: N/A;  . MENISCUS REPAIR Left 2015  . PITUITARY SURGERY    . REDUCTION MAMMAPLASTY    . RIGHT HEART CATH N/A 06/25/2017   Procedure: RIGHT HEART CATH;  Surgeon: Sherren Mocha, MD;  Location: [place] [facility];  Service: Cardiovascular;  Laterality: N/A;  . THYROID SURGERY      Family History  Problem Relation Age of Onset  . Diabetes Mother   . Hyperlipidemia Mother   . Hypertension Mother     Social History:  reports that she has never smoked. She has never used smokeless tobacco. She reports that she drinks about 1.0 standard drinks of alcohol per week. She reports that she does not use drugs.  Review of Systems:  Hypertension:  Fair control, blood pressure is managed by cardiologist  Lipids: She was finally able to start [place] in 08/2017  LDL below 70 Does use the Repatha injection every 2 weeks without any side effects  Lab Results  Component Value Date   CHOL 129 11/13/2017   HDL 48.40 11/13/2017   LDLCALC 60 11/13/2017   TRIG 104.0 11/13/2017   CHOLHDL 3 11/13/2017   Lab Results  Component Value Date   ALT 28 11/07/2017    Diabetic shoe prescription has been given previously     Examination:   BP 126/86   Pulse 72   Ht 6' (1.829 m)   Wt 252 lb (114.3  kg)   BMI 34.18 kg/m   Body mass index is 34.18 kg/m.     ASSESSMENT/ PLAN:    Diabetes type 2 with obesity See history of present illness for detailed discussion of current management, blood sugar patterns and problems identified  Her A1c  has usually been consistently high  Although she is using less insulin using the insulin pump and the U-200 insulin her control has been inconsistent She thinks that she is not getting enough insulin supply with the current regimen However when she is taking [place] insulin she is taking much larger doses than she needs to having this is causing hypoglycemia time Her insulin requirement is difficult to assess because of inconsistent use of the pump, inconsistent glucose monitoring and frequently no bolusing However her basal insulin requirement is only about 42 units on the current settings   Recommendations today:   Consistent diet and glucose monitoring Must check blood sugars with every meal and some after meals She will contact the insurance about the freestyle libre system otherwise will get her the Dexcom She will be contacted by the nurse educator regarding using Humalog U-200 to properly fill her pump She should not be requiring more than 1 mL / 200 units to fill the pod if she is going to use about 0.2 mL/day for basal We will need to adjust her basal rate settings based on future use and boluses May need to adjust her boluses also if she is getting more consistent postprandial hyperglycemia If she does need to use [place] she will not use more than 40 to 50 units daily  HYPERTENSION: Upper normal diastolic today she will continue to follow-up with PCP and cardiologist   LIPIDS: To continue Repatha with excellent control  Counseling time on subjects discussed in assessment and plan sections is over 50% of today's 25 minute visit    Patient Instructions  Check 4 x per day  Destiny Beasley 01/21/2018, 12:02 PM   Counseling time on subjects discussed in assessment and plan sections is over 50% of today's 25 minute visit

## 2018-01-21 ENCOUNTER — Telehealth: Payer: Self-pay | Admitting: Nutrition

## 2018-01-21 ENCOUNTER — Ambulatory Visit (INDEPENDENT_AMBULATORY_CARE_PROVIDER_SITE_OTHER): Payer: 59 | Admitting: Endocrinology

## 2018-01-21 ENCOUNTER — Encounter: Payer: Self-pay | Admitting: Endocrinology

## 2018-01-21 VITALS — BP 126/86 | HR 72 | Ht 72.0 in | Wt 252.0 lb

## 2018-01-21 DIAGNOSIS — I10 Essential (primary) hypertension: Secondary | ICD-10-CM

## 2018-01-21 DIAGNOSIS — E7800 Pure hypercholesterolemia, unspecified: Secondary | ICD-10-CM | POA: Diagnosis not present

## 2018-01-21 DIAGNOSIS — E1165 Type 2 diabetes mellitus with hyperglycemia: Secondary | ICD-10-CM | POA: Diagnosis not present

## 2018-01-21 DIAGNOSIS — Z794 Long term (current) use of insulin: Secondary | ICD-10-CM

## 2018-01-21 NOTE — Telephone Encounter (Signed)
Message left on home and cell phone to call me.  Message left at work number to call me at the office in the AM.

## 2018-01-21 NOTE — Patient Instructions (Signed)
Check 4 x per day

## 2018-01-22 NOTE — Telephone Encounter (Signed)
Attempted to call all numbers on file to discuss symptoms with patient. Left message to call back in the morning or proceed to ER if symptoms worsen.

## 2018-01-23 ENCOUNTER — Telehealth: Payer: Self-pay | Admitting: Cardiovascular Disease

## 2018-01-23 NOTE — Telephone Encounter (Signed)
New Message: ° ° ° °Patient is returning a call °

## 2018-01-23 NOTE — Telephone Encounter (Signed)
Patient called back in response to MyChart message. She has been having 1-2 episodes of chest "twingeing" and shortness of breath daily for about 2 weeks. The episodes occur at rest and on exertion and resolve with 1 NTG tablet.  During the episodes, she states her heart thumps and may be beating fast.  She denies other symptoms.  She has no vital signs to report. Scheduled patient for evaluation this Friday with Richardson Dopp, PA.  She understands to seek emergency medical attention prior to that time if symptoms worsen or are not resolved with NTG (NTG instructions reviewed in detail). She was grateful for call and agrees with treatment plan.

## 2018-01-23 NOTE — Telephone Encounter (Signed)
Attempted to call home, cell, and work numbers. Left message to call back on home and cell VM. Her work is closed and was unable to leave message as it went to emergency line (she works for an apartment complex).

## 2018-01-24 NOTE — Progress Notes (Signed)
Cardiology Office Note:    Date:  01/25/2018   ID:  Destiny Beasley, DOB 1956/04/04, MRN [id number]  PCP:  Lawerance Cruel, MD  Cardiologist:  Sherren Mocha, MD  Electrophysiologist:  None   Referring MD: Lawerance Cruel, MD   Chief Complaint  Patient presents with  . Chest Pain    History of Present Illness:    Destiny Beasley is a 62 y.o. female with coronary artery disease status post inferolateral STEMI in March 2683 complicated by cardiogenic shock, acute systolic heart failure and complete heart block.  Cardiac catheterization demonstrated an occluded proximal left circumflex which was treated with a drug-eluting stent.  EF is 40-45% by echocardiogram.  Other history includes diabetes, prior TIA, sleep apnea, breast cancer, hypertension, hyperlipidemia.   Destiny Beasley returns for the evaluation of chest discomfort.  Over the past several weeks, she has had left-sided chest discomfort that she describes as a "twitch".  This occurs at rest.  It does not usually occur with exertion.  She is able to alleviate her symptoms with nitroglycerin.  She has noted worsening shortness of breath over the past few weeks.  She denies any radiating symptoms, nausea, diaphoresis.  She denies syncope or near syncope.  She is sleeps on an incline.  She wears CPAP at night.  She has not had lower extremity swelling.  She denies a cough.  Prior CV studies:   The following studies were reviewed today:  Echo 06/26/17 EF 40-45, inferolateral hypokinesis, mild MR  Cardiac catheterization 06/25/17 LM luminal irregularities LAD luminal irregularities, proximal 30 LCx proximal 100 RCA irregularities EF 45-50, inferolateral akinesis PCI: 4 x 20 mm Synergy DES to the proximal LCx  Echo 03/25/16 Moderate LVH, EF 60-65, normal wall motion  Carotid US 12/17 Bilateral ICA 1-39  Nuclear stress test 7/13 1. Negative for pharmacologic-stress induced ischemia. 2. Left ventricular ejection fraction  65%.  Past Medical History:  Diagnosis Date  . Anemia   . Anxiety   . CAD in native artery    a. Inf STEMI 07/1960 complicated by cardiogenic shock/complete heart block s/p emergent cath showing culprit large dominant LCx s/p aspiration thrombectomy and DES, EF 50% by cath, 40-45% by echo.  . Complete heart block, transient ([place])    a. 06/2017 in setting of acute inf MI -> resolved after PCI.  . Diabetes mellitus   . Goiter   . History of radiation therapy 06/14/2016 - 07/19/2016   Right Breast 50 Gy 25 fractions  . Hypercholesteremia   . Hypertension   . Ischemic cardiomyopathy    a. EF 50% by cath and 40-45% by echo 06/2017.  . Malignant neoplasm of upper-outer quadrant of right female breast ([place]) 02/24/2016  . Meningioma ([place])   . Obesity   . Personal history of chemotherapy   . Personal history of radiation therapy   . Pituitary tumor   . Seasonal asthma   . Sleep apnea    does not use every night  . Stroke [facility])    TIA - Dec 1 st, 2017   Surgical Hx: The patient  has a past surgical history that includes Cesarean section; Abdominal hysterectomy; Thyroid surgery; Pituitary surgery; Brain meningioma excision (2005); Meniscus repair (Left, 2015); Foot surgery (Bilateral, 2016); Breast lumpectomy with radioactive seed localization (Right, 04/20/2016); Breast reduction surgery (Bilateral, 10/23/2016); Breast lumpectomy (Right); Breast biopsy; Reduction mammaplasty; LEFT HEART CATH AND CORONARY ANGIOGRAPHY (N/A, 06/25/2017); RIGHT HEART CATH (N/A, 06/25/2017); Coronary/Graft Acute MI Revascularization (N/A, 06/25/2017); and  CORONARY STENT INTERVENTION (N/A, 06/25/2017).   Current Medications: Current Meds  Medication Sig  . aspirin EC 81 MG EC tablet Take 1 tablet (81 mg total) by mouth daily.  . Biotin 10000 MCG TABS Take by mouth.  . Cholecalciferol (VITAMIN D-3 PO) Take by mouth.  . Coenzyme Q-10 100 MG capsule Take 100 mg by mouth daily.  . DULERA 100-5 MCG/ACT AERO Inhale 2 puffs into  the lungs 2 (two) times daily as needed for wheezing or shortness of breath (allergies).   . Evolocumab (REPATHA SURECLICK) 376 MG/ML SOAJ Inject 140 mg into the skin every 14 (fourteen) days.  Marland Kitchen FREESTYLE TEST STRIPS test strip USE THREE TIMES DAILY TO CHECK BLOOD SUGAR  . furosemide (LASIX) 20 MG tablet Take 20 mg by mouth daily.  . Insulin Degludec (TRESIBA FLEXTOUCH) 200 UNIT/ML SOPN Inject 70 Units into the skin daily.  . Insulin Lispro (HUMALOG KWIKPEN) 200 UNIT/ML SOPN Inject 70 Units into the skin 3 (three) times daily.  . Insulin Pen Needle (B-D UF III MINI PEN NEEDLES) 31G X 5 MM MISC USE TO INJECT INSULIN FOUR TIMES DAILY  . irbesartan (AVAPRO) 75 MG tablet Take 1 tablet (75 mg total) by mouth daily.  . isosorbide mononitrate (IMDUR) 30 MG 24 hr tablet Take 1 tablet (30 mg total) by mouth daily.  Marland Kitchen liraglutide (VICTOZA) 18 MG/3ML SOPN ADMINISTER 1.8 MG UNDER THE SKIN EVERY DAY  . metoprolol tartrate (LOPRESSOR) 25 MG tablet Take 25 mg by mouth 2 (two) times daily.  . MULTIPLE VITAMIN PO Take 1 tablet daily by mouth.  . nitroGLYCERIN (NITROSTAT) 0.4 MG SL tablet Place 1 tablet (0.4 mg total) under the tongue every 5 (five) minutes as needed for chest pain (up to 3 doses. If taking 3rd dose call 911).  . ticagrelor (BRILINTA) 90 MG TABS tablet Take 1 tablet (90 mg total) by mouth 2 (two) times daily.  Marland Kitchen venlafaxine (EFFEXOR-XR) 150 MG 24 hr capsule Take 150 mg by mouth daily.   . [DISCONTINUED] furosemide (LASIX) 20 MG tablet Take 1 tablet (20 mg total) by mouth 2 (two) times daily. (Patient taking differently: Take 20 mg by mouth daily. )     Allergies:   Hydrocodone; Invokana [canagliflozin]; and Atorvastatin   Social History   Tobacco Use  . Smoking status: Never Smoker  . Smokeless tobacco: Never Used  Substance Use Topics  . Alcohol use: Yes    Alcohol/week: 1.0 standard drinks    Types: 1 Glasses of wine per week    Comment: 1 glass monthly  . Drug use: No     Family  Hx: The patient's family history includes Diabetes in her mother; Hyperlipidemia in her mother; Hypertension in her mother.  ROS:   Please see the history of present illness.    Review of Systems  Cardiovascular: Positive for chest pain and irregular heartbeat.  Respiratory: Positive for shortness of breath.   Hematologic/Lymphatic: Bruises/bleeds easily.   All other systems reviewed and are negative.   EKGs/Labs/Other Test Reviewed:    EKG:  EKG is  ordered today.  The ekg ordered today demonstrates normal sinus rhythm, heart rate 75, normal axis, inferior Q waves, first-degree AV block (PR 210), QTC 428, no acute ST-TW changes  Recent Labs: 06/25/2017: B Natriuretic Peptide 58.0 11/07/2017: ALT 28 12/31/2017: Hemoglobin 13.5; Platelets 353 01/16/2018: BUN 20; Creatinine, Ser 0.82; Potassium 3.9; Sodium 142   Recent Lipid Panel Lab Results  Component Value Date/Time   CHOL 129 11/13/2017 09:30  AM   TRIG 104.0 11/13/2017 09:30 AM   HDL 48.40 11/13/2017 09:30 AM   CHOLHDL 3 11/13/2017 09:30 AM   LDLCALC 60 11/13/2017 09:30 AM    Physical Exam:    VS:  BP 136/72   Pulse 85   Ht 6' (1.829 m)   Wt 257 lb 6.4 oz (116.8 kg)   SpO2 99%   BMI 34.91 kg/m     Wt Readings from Last 3 Encounters:  01/25/18 257 lb 6.4 oz (116.8 kg)  01/21/18 252 lb (114.3 kg)  11/13/17 247 lb 9.6 oz (112.3 kg)     Physical Exam  Constitutional: She is oriented to person, place, and time. She appears well-developed and well-nourished. No distress.  HENT:  Head: Normocephalic and atraumatic.  Eyes: No scleral icterus.  Neck: No JVD present. No thyromegaly present.  Cardiovascular: Normal rate and regular rhythm.  No murmur heard. Pulmonary/Chest: Effort normal. She has no rales.  Abdominal: Soft.  Musculoskeletal: She exhibits no edema.  Lymphadenopathy:    She has no cervical adenopathy.  Neurological: She is alert and oriented to person, place, and time.  Skin: Skin is warm and dry.   Psychiatric: She has a normal mood and affect.    ASSESSMENT & PLAN:    Coronary artery disease involving native coronary artery of native heart with angina pectoris ([place]) History of inferior posterior MI in March 6301 complicated by cardiogenic shock, complete heart block and systolic heart failure.  She has had symptoms of left-sided chest discomfort responsive to nitroglycerin.  She had symptoms of indigestion prior to her myocardial infarction.  She has not had recurrent indigestion feelings.  She has also noted some increasing shortness of breath.  She has had fairly chronic shortness of breath from Brilinta since her myocardial infarction.  There are no acute changes on her ECG.  She had no significant residual CAD the time of her cardiac catheterization earlier this year.  Question if she has small vessel disease.  She has been contemplating breast surgery and would like to proceed when she is 12 months out from her myocardial infarction.  -Continue aspirin, Brilinta, beta-blocker, evolocumab  -Start isosorbide 30 mg daily  -Arrange exercise Myoview  -Follow-up 4-6 weeks  Chronic systolic heart failure (HCC) EF 40-45.  NYHA 2.  Volume status appears stable.  She has had chronic shortness of breath since starting on Brilinta.  However, her breathing seems to have worsened over the past several weeks.  Her weight is up about 10 pounds since July.  She does not have any signs of decompensated heart failure on exam.  -Continue beta-blocker, ARB  -Add nitrates as noted  -Obtain BMET, BNP  -If BNP significantly elevated, increase dose of furosemide   Essential hypertension Fair control.  Medication adjustments as noted.  Continue to monitor.  Palpitations   She has a feeling of a twitch.  Question if she may be having PVCs.  Obtain Myoview, labs and start isosorbide as noted above.  If her workup is unremarkable and she continues to have these symptoms, consider Holter monitor.   Dispo:   Return in about 6 weeks (around 03/08/2018) for Close Follow Up, w/ Dr. Burt Knack, or Richardson Dopp, PA-C.   Medication Adjustments/Labs and Tests Ordered: Current medicines are reviewed at length with the patient today.  Concerns regarding medicines are outlined above.  Tests Ordered: Orders Placed This Encounter  Procedures  . Basic metabolic panel  . Pro b natriuretic peptide (BNP)  . MYOCARDIAL  PERFUSION IMAGING  . EKG 12-Lead   Medication Changes: Meds ordered this encounter  Medications  . isosorbide mononitrate (IMDUR) 30 MG 24 hr tablet    Sig: Take 1 tablet (30 mg total) by mouth daily.    Dispense:  90 tablet    Refill:  3    Signed, Richardson Dopp, PA-C  01/25/2018 9:26 AM    [facility] [place], [place], [place]  [address] Phone: [phone]; Fax: [phone]

## 2018-01-25 ENCOUNTER — Encounter: Payer: Self-pay | Admitting: *Deleted

## 2018-01-25 ENCOUNTER — Ambulatory Visit (INDEPENDENT_AMBULATORY_CARE_PROVIDER_SITE_OTHER): Payer: 59 | Admitting: Physician Assistant

## 2018-01-25 ENCOUNTER — Encounter: Payer: Self-pay | Admitting: Physician Assistant

## 2018-01-25 VITALS — BP 136/72 | HR 85 | Ht 72.0 in | Wt 257.4 lb

## 2018-01-25 DIAGNOSIS — I25119 Atherosclerotic heart disease of native coronary artery with unspecified angina pectoris: Secondary | ICD-10-CM

## 2018-01-25 DIAGNOSIS — R002 Palpitations: Secondary | ICD-10-CM | POA: Diagnosis not present

## 2018-01-25 DIAGNOSIS — I5022 Chronic systolic (congestive) heart failure: Secondary | ICD-10-CM | POA: Diagnosis not present

## 2018-01-25 DIAGNOSIS — I10 Essential (primary) hypertension: Secondary | ICD-10-CM

## 2018-01-25 LAB — BASIC METABOLIC PANEL
BUN / CREAT RATIO: 17 (ref 9–23)
BUN: 14 mg/dL (ref 6–24)
CO2: 27 mmol/L (ref 20–29)
CREATININE: 0.84 mg/dL (ref 0.57–1.00)
Calcium: 9.9 mg/dL (ref 8.7–10.2)
Chloride: 102 mmol/L (ref 96–106)
GFR, EST AFRICAN AMERICAN: 88 mL/min/{1.73_m2} (ref >59)
GFR, EST NON AFRICAN AMERICAN: 76 mL/min/{1.73_m2} (ref >59)
Glucose: 93 mg/dL (ref 65–99)
Potassium: 4.2 mmol/L (ref 3.5–5.2)
Sodium: 142 mmol/L (ref 134–144)

## 2018-01-25 LAB — PRO B NATRIURETIC PEPTIDE: NT-Pro BNP: 281 pg/mL (ref 0–287)

## 2018-01-25 MED ORDER — ISOSORBIDE MONONITRATE ER 30 MG PO TB24: 30.0000 mg | ORAL_TABLET | Freq: Every day | ORAL | 3 refills | Status: DC

## 2018-01-25 NOTE — Patient Instructions (Signed)
Medication Instructions:   START TAKING  IMDUR 30 MG ONCE A DAY   START BACK  TAKING METOPROLOL TARTRATE 25 MG TWICE A DAY   If you need a refill on your cardiac medications before your next appointment, please call your pharmacy.   Labwork: BMET  AND  BNP TODAY   PROCEDURES:  Your physician has requested that you have en exercise stress myoview. For further information please visit [email or web address]. Please follow instruction sheet, as given.  Testing/Procedures:    FOLLOW UP : IN 4 TO 6 WEEKS WITH WEAVER    Any Other Special Instructions Will Be Listed Below (If Applicable).

## 2018-01-28 ENCOUNTER — Telehealth: Payer: Self-pay

## 2018-01-28 NOTE — Telephone Encounter (Signed)
Spoke to patient with results/recommendations.

## 2018-01-28 NOTE — Telephone Encounter (Signed)
Notes recorded by Frederik Schmidt, RN on 01/28/2018 at 12:55 PM EDT The patient has been notified of the result and verbalized understanding. All questions (if any) were answered. Frederik Schmidt, RN 01/28/2018 12:55 PM

## 2018-01-28 NOTE — Telephone Encounter (Signed)
Notes recorded by Frederik Schmidt, RN on 01/28/2018 at 8:17 AM EDT [id number] 10/7 ------

## 2018-01-28 NOTE — Telephone Encounter (Signed)
-----   Message from Liliane Shi, [place] sent at 01/25/2018  5:07 PM EDT ----- Renal function and potassium are normal.  The BNP is normal.  Recommendations:  - Continue current medications and follow up as planned.  Richardson Dopp, PA-C    01/25/2018 5:05 PM

## 2018-01-28 NOTE — Telephone Encounter (Signed)
Follow up: ° ° °Patient returning call back °

## 2018-02-06 ENCOUNTER — Other Ambulatory Visit: Payer: Self-pay | Admitting: Endocrinology

## 2018-02-07 ENCOUNTER — Telehealth (HOSPITAL_COMMUNITY): Payer: Self-pay | Admitting: *Deleted

## 2018-02-07 NOTE — Telephone Encounter (Signed)
Patient given detailed instructions per Myocardial Perfusion Study Information Sheet for the test on 02/11/18. Patient notified to arrive 15 minutes early and that it is imperative to arrive on time for appointment to keep from having the test rescheduled.  If you need to cancel or reschedule your appointment, please call the office within 24 hours of your appointment. . Patient verbalized understanding. Kirstie Peri

## 2018-02-11 ENCOUNTER — Ambulatory Visit (HOSPITAL_COMMUNITY): Payer: 59 | Attending: Cardiovascular Disease

## 2018-02-11 DIAGNOSIS — R002 Palpitations: Secondary | ICD-10-CM

## 2018-02-11 DIAGNOSIS — I25119 Atherosclerotic heart disease of native coronary artery with unspecified angina pectoris: Secondary | ICD-10-CM | POA: Diagnosis not present

## 2018-02-11 MED ORDER — TECHNETIUM TC 99M TETROFOSMIN IV KIT
31.6000 | PACK | Freq: Once | INTRAVENOUS | Status: AC | PRN
  Administered 2018-02-11: 31.6 via INTRAVENOUS
  Filled 2018-02-11: qty 32

## 2018-02-12 ENCOUNTER — Ambulatory Visit (HOSPITAL_COMMUNITY): Payer: 59 | Attending: Cardiology

## 2018-02-12 ENCOUNTER — Encounter: Payer: Self-pay | Admitting: Physician Assistant

## 2018-02-12 LAB — MYOCARDIAL PERFUSION IMAGING
CHL CUP MPHR: 160 {beats}/min
CHL CUP NUCLEAR SDS: 2
CHL CUP RESTING HR STRESS: 81 {beats}/min
CHL RATE OF PERCEIVED EXERTION: 18
CSEPEW: 7 METS
Exercise duration (min): 6 min
LV dias vol: 116 mL (ref 46–106)
LVSYSVOL: 76 mL
Peak HR: 150 {beats}/min
Percent HR: 93 %
SRS: 9
SSS: 12
TID: 0.97

## 2018-02-12 MED ORDER — TECHNETIUM TC 99M TETROFOSMIN IV KIT
32.3000 | PACK | Freq: Once | INTRAVENOUS | Status: AC | PRN
  Administered 2018-02-12: 32.3 via INTRAVENOUS
  Filled 2018-02-12: qty 33

## 2018-02-14 ENCOUNTER — Encounter: Payer: Self-pay | Admitting: Physician Assistant

## 2018-02-27 ENCOUNTER — Other Ambulatory Visit: Payer: Self-pay | Admitting: Obstetrics and Gynecology

## 2018-02-27 DIAGNOSIS — Z853 Personal history of malignant neoplasm of breast: Secondary | ICD-10-CM

## 2018-02-28 ENCOUNTER — Encounter: Payer: Self-pay | Admitting: Physician Assistant

## 2018-02-28 NOTE — Progress Notes (Signed)
Cardiology Office Note:    Date:  03/01/2018   ID:  [name], DOB July 29, 1955, MRN [id number]  PCP:  Destiny Cruel, MD  Cardiologist:  Destiny Mocha, MD   Electrophysiologist:  None   Referring MD: Destiny Cruel, MD   Chief Complaint  Patient presents with  . Follow-up    CAD     History of Present Illness:    Destiny Beasley is a 62 y.o. female with coronary artery disease status post inferolateral STEMI in March 4098 complicated by cardiogenic shock, acute systolic heart failure and complete heart block. Cardiac catheterization demonstrated an occluded proximal left circumflex which was treated with a drug-eluting stent. EF is 40-45% by echocardiogram. Other history includes diabetes, prior TIA, sleep apnea, breast cancer, hypertension, hyperlipidemia.  She was last seen 01/25/18 for the evaluation of chest pain.  Stress testing demonstrated  inferolateral, apical scar; inferior, apical inferior infarct with mild peri-infarct ischemia and an EF of 35%.  This was reviewed with Dr. Burt Knack.  It was felt that the findings represented her known disease and medical Rx could be continued.  If she has progressive symptoms, she would need a Cardiac Catheterization.    Destiny Beasley returns for follow-up.  She is here with her daughter.  Since last seen, she has not had any further palpitations or chest pain.  She still feels short of breath at times with Brilinta.  She has not had any syncope, leg swelling, PND.  Prior CV studies:   The following studies were reviewed today:  Nuclear stress test 02/12/18 EF 35, inferolateral, apical scar; inferior, apical inferior infarct with mild peri-infarct ischemia; high risk  Echo 06/26/17 EF 40-45, inferolateral hypokinesis, mild MR  Cardiac catheterization 06/25/17 LM luminal irregularities LAD luminal irregularities, proximal 30 LCx proximal 100 RCA irregularities EF 45-50, inferolateral akinesis PCI: 4 x 20 mm Synergy DES to the  proximal LCx  Echo 03/25/16 Moderate LVH, EF 60-65, normal wall motion  Carotid US 12/17 Bilateral ICA 1-39  Nuclear stress test 7/13 1. Negative for pharmacologic-stress induced ischemia. 2. Left ventricular ejection fraction 65%.  Past Medical History:  Diagnosis Date  . Anemia   . Anxiety   . CAD in native artery    a. Inf STEMI 04/1912 complicated by cardiogenic shock/complete heart block s/p emergent cath showing culprit large dominant LCx s/p aspiration thrombectomy and DES, EF 50% by cath, 40-45% by echo.  . Complete heart block, transient ([place])    a. 06/2017 in setting of acute inf MI -> resolved after PCI.  . Diabetes mellitus   . Goiter   . History of radiation therapy 06/14/2016 - 07/19/2016   Right Breast 50 Gy 25 fractions  . Hypercholesteremia   . Hypertension   . Ischemic cardiomyopathy    a. EF 50% by cath and 40-45% by echo 06/2017.  . Malignant neoplasm of upper-outer quadrant of right female breast ([place]) 02/24/2016  . Meningioma ([place])   . Nuclear stress test    Nuclear stress test 10/19: EF 35, inferolateral, apical scar; inferior, apical inferior infarct with mild peri-infarct ischemia; high risk  . Obesity   . Personal history of chemotherapy   . Personal history of radiation therapy   . Pituitary tumor   . Seasonal asthma   . Sleep apnea    does not use every night  . Stroke [facility])    TIA - Dec 1 st, 2017   Surgical Hx: The patient  has a past surgical history  that includes Cesarean section; Abdominal hysterectomy; Thyroid surgery; Pituitary surgery; Brain meningioma excision (2005); Meniscus repair (Left, 2015); Foot surgery (Bilateral, 2016); Breast lumpectomy with radioactive seed localization (Right, 04/20/2016); Breast reduction surgery (Bilateral, 10/23/2016); Breast lumpectomy (Right); Breast biopsy; Reduction mammaplasty; LEFT HEART CATH AND CORONARY ANGIOGRAPHY (N/A, 06/25/2017); RIGHT HEART CATH (N/A, 06/25/2017); Coronary/Graft Acute MI  Revascularization (N/A, 06/25/2017); and CORONARY STENT INTERVENTION (N/A, 06/25/2017).   Current Medications: Current Meds  Medication Sig  . aspirin EC 81 MG EC tablet Take 1 tablet (81 mg total) by mouth daily.  . Cholecalciferol (VITAMIN D-3 PO) Take by mouth.  . DULERA 100-5 MCG/ACT AERO Inhale 2 puffs into the lungs 2 (two) times daily as needed for wheezing or shortness of breath (allergies).   . Evolocumab (REPATHA SURECLICK) 749 MG/ML SOAJ Inject 140 mg into the skin every 14 (fourteen) days.  Marland Kitchen FREESTYLE TEST STRIPS test strip USE THREE TIMES DAILY TO CHECK BLOOD SUGAR  . furosemide (LASIX) 20 MG tablet Take 20 mg by mouth daily.  . Insulin Degludec (TRESIBA FLEXTOUCH) 200 UNIT/ML SOPN Inject 70 Units into the skin daily.  . Insulin Lispro (HUMALOG KWIKPEN) 200 UNIT/ML SOPN Inject 70 Units into the skin 3 (three) times daily.  . Insulin Pen Needle (B-D UF III MINI PEN NEEDLES) 31G X 5 MM MISC USE TO INJECT INSULIN FOUR TIMES DAILY  . irbesartan (AVAPRO) 75 MG tablet Take 1 tablet (75 mg total) by mouth daily.  . isosorbide mononitrate (IMDUR) 30 MG 24 hr tablet Take 1 tablet (30 mg total) by mouth daily.  Marland Kitchen liraglutide (VICTOZA) 18 MG/3ML SOPN ADMINISTER 1.8 MG UNDER THE SKIN EVERY DAY  . metoprolol tartrate (LOPRESSOR) 25 MG tablet Take 25 mg by mouth 2 (two) times daily.  . MULTIPLE VITAMIN PO Take 1 tablet daily by mouth.  . nitroGLYCERIN (NITROSTAT) 0.4 MG SL tablet Place 1 tablet (0.4 mg total) under the tongue every 5 (five) minutes as needed for chest pain (up to 3 doses. If taking 3rd dose call 911).  . ticagrelor (BRILINTA) 90 MG TABS tablet Take 1 tablet (90 mg total) by mouth 2 (two) times daily.  Marland Kitchen venlafaxine (EFFEXOR-XR) 150 MG 24 hr capsule Take 150 mg by mouth daily.      Allergies:   Hydrocodone; Invokana [canagliflozin]; and Atorvastatin   Social History   Tobacco Use  . Smoking status: Never Smoker  . Smokeless tobacco: Never Used  Substance Use Topics  .  Alcohol use: Yes    Alcohol/week: 1.0 standard drinks    Types: 1 Glasses of wine per week    Comment: 1 glass monthly  . Drug use: No     Family Hx: The patient's family history includes Diabetes in her mother; Hyperlipidemia in her mother; Hypertension in her mother.  ROS:   Please see the history of present illness.    Review of Systems  Constitution: Positive for diaphoresis.  Respiratory: Positive for shortness of breath.   Musculoskeletal: Positive for joint pain.  Gastrointestinal: Positive for constipation.  Neurological: Positive for headaches.   All other systems reviewed and are negative.   EKGs/Labs/Other Test Reviewed:    EKG:  EKG is not ordered today.   Recent Labs: 06/25/2017: B Natriuretic Peptide 58.0 11/07/2017: ALT 28 12/31/2017: Hemoglobin 13.5; Platelets 353 01/25/2018: BUN 14; Creatinine, Ser 0.84; NT-Pro BNP 281; Potassium 4.2; Sodium 142   Recent Lipid Panel Lab Results  Component Value Date/Time   CHOL 129 11/13/2017 09:30 AM   TRIG 104.0 11/13/2017  09:30 AM   HDL 48.40 11/13/2017 09:30 AM   CHOLHDL 3 11/13/2017 09:30 AM   LDLCALC 60 11/13/2017 09:30 AM    Physical Exam:    VS:  BP (!) 142/76   Pulse 62   Ht 6' (1.829 m)   Wt 254 lb 6.4 oz (115.4 kg)   BMI 34.50 kg/m     Wt Readings from Last 3 Encounters:  03/01/18 254 lb 6.4 oz (115.4 kg)  02/11/18 257 lb (116.6 kg)  01/25/18 257 lb 6.4 oz (116.8 kg)     Physical Exam  Constitutional: She is oriented to person, place, and time. She appears well-developed and well-nourished. No distress.  HENT:  Head: Normocephalic and atraumatic.  Eyes: No scleral icterus.  Neck: No JVD present. No thyromegaly present.  Cardiovascular: Normal rate and regular rhythm.  No murmur heard. Pulmonary/Chest: Effort normal. She has no rales.  Abdominal: Soft.  Musculoskeletal: She exhibits no edema.  Lymphadenopathy:    She has no cervical adenopathy.  Neurological: She is alert and oriented to  person, place, and time.  Skin: Skin is warm and dry.  Psychiatric: She has a normal mood and affect.    ASSESSMENT & PLAN:    Coronary artery disease involving native coronary artery of native heart with angina pectoris [facility]) History of posterior MI in March 1610 complicated by cardiogenic shock, complete heart block and systolic heart failure.  Recent nuclear stress test demonstrated significant scar but no significant ischemia.  As noted, I reviewed this with Dr. Burt Knack.  As long as the patient does not have progressive symptoms, we can continue to treat her medically.  Since last seen, she has not had any further chest discomfort.  She really does not describe significant shortness of breath.  Continue aspirin, Brilinta, evolocumab, irbesartan, metoprolol, isosorbide.  Chronic systolic heart failure ([place]) She is somewhat concerned about her EF noted on nuclear stress test.  We discussed her nuclear stress tests are notoriously inaccurate for predicting ejection fraction.  Her EF was 40-45% post MI.  However, given the discrepancy between her nuclear stress test and echocardiogram, I have suggested that we proceed with a follow-up echocardiogram.  Continue to irbesartan, metoprolol, isosorbide.  I will start her on spironolactone 12.5 mg daily.  Obtain BMET next week and 1 week later.  Essential hypertension Blood pressure somewhat above target.  She does take decongestants from time to time.  I have asked her to stop doing this.  I have also placed her on Spironolactone as noted above.  Palpitations No further recurrence.  Continue observance for now.   Dispo:  Return in about 4 months (around 06/30/2018) for Routine Follow Up, w/ Dr. Burt Knack, or Richardson Dopp, PA-C.   Medication Adjustments/Labs and Tests Ordered: Current medicines are reviewed at length with the patient today.  Concerns regarding medicines are outlined above.  Tests Ordered: Orders Placed This Encounter  Procedures  .  Basic metabolic panel  . Basic metabolic panel  . ECHOCARDIOGRAM COMPLETE   Medication Changes: Meds ordered this encounter  Medications  . spironolactone (ALDACTONE) 25 MG tablet    Sig: Take 0.5 tablets (12.5 mg total) by mouth daily.    Dispense:  45 tablet    Refill:  3    Signed, Richardson Dopp, PA-C  03/01/2018 1:02 PM    [facility] [place], [place], [place]  [address] Phone: [phone]; Fax: [phone]

## 2018-03-01 ENCOUNTER — Ambulatory Visit (INDEPENDENT_AMBULATORY_CARE_PROVIDER_SITE_OTHER): Payer: 59 | Admitting: Physician Assistant

## 2018-03-01 ENCOUNTER — Encounter: Payer: Self-pay | Admitting: Physician Assistant

## 2018-03-01 VITALS — BP 142/76 | HR 62 | Ht 72.0 in | Wt 254.4 lb

## 2018-03-01 DIAGNOSIS — I10 Essential (primary) hypertension: Secondary | ICD-10-CM | POA: Diagnosis not present

## 2018-03-01 DIAGNOSIS — I25119 Atherosclerotic heart disease of native coronary artery with unspecified angina pectoris: Secondary | ICD-10-CM | POA: Diagnosis not present

## 2018-03-01 DIAGNOSIS — R002 Palpitations: Secondary | ICD-10-CM

## 2018-03-01 DIAGNOSIS — I5022 Chronic systolic (congestive) heart failure: Secondary | ICD-10-CM

## 2018-03-01 MED ORDER — SPIRONOLACTONE 25 MG PO TABS: 12.5000 mg | ORAL_TABLET | Freq: Every day | ORAL | 3 refills | Status: DC

## 2018-03-01 NOTE — Patient Instructions (Signed)
Medication Instructions:  Your physician has recommended you make the following change in your medication:  1. START SPIRONOLACTONE 12.5 MG DAILY.  If you need a refill on your cardiac medications before your next appointment, please call your pharmacy.   Lab work: 1.LABS TO BE DONE IN 1 WEEK: BMET  2. LABS TO BE DONE IN 2 WEEKS: BMET   If you have labs (blood work) drawn today and your tests are completely normal, you will receive your results only by: Marland Kitchen MyChart Message (if you have MyChart) OR . A paper copy in the mail If you have any lab test that is abnormal or we need to change your treatment, we will call you to review the results.  Testing/Procedures: Your physician has requested that you have an echocardiogram. Echocardiography is a painless test that uses sound waves to create images of your heart. It provides your doctor with information about the size and shape of your heart and how well your heart's chambers and valves are working. This procedure takes approximately one hour. There are no restrictions for this procedure.    Follow-Up: At [facility], you and your health needs are our priority.  As part of our continuing mission to provide you with exceptional heart care, we have created designated Provider Care Teams.  These Care Teams include your primary Cardiologist (physician) and Advanced Practice Providers (APPs -  Physician Assistants and Nurse Practitioners) who all work together to provide you with the care you need, when you need it. You will need a follow up appointment in:  4 months (06/2018).  Please call our office 2 months in advance to schedule this appointment.  You may see Sherren Mocha, MD or one of the following Advanced Practice Providers on your designated Care Team: Richardson Dopp, PA-C [place], [place] . Daune Perch, NP  Any Other Special Instructions Will Be Listed Below (If Applicable).

## 2018-03-04 NOTE — Telephone Encounter (Signed)
I have addressed with patient. Richardson Dopp, PA-C    03/04/2018 5:16 PM

## 2018-03-07 ENCOUNTER — Ambulatory Visit (HOSPITAL_COMMUNITY): Payer: 59 | Attending: Internal Medicine

## 2018-03-07 ENCOUNTER — Encounter: Payer: Self-pay | Admitting: Physician Assistant

## 2018-03-07 ENCOUNTER — Telehealth: Payer: Self-pay | Admitting: *Deleted

## 2018-03-07 ENCOUNTER — Other Ambulatory Visit: Payer: Self-pay

## 2018-03-07 ENCOUNTER — Other Ambulatory Visit: Payer: 59 | Admitting: *Deleted

## 2018-03-07 DIAGNOSIS — I5022 Chronic systolic (congestive) heart failure: Secondary | ICD-10-CM

## 2018-03-07 LAB — BASIC METABOLIC PANEL
BUN/Creatinine Ratio: 14 (ref 12–28)
BUN: 15 mg/dL (ref 8–27)
CALCIUM: 10.1 mg/dL (ref 8.7–10.3)
CO2: 27 mmol/L (ref 20–29)
CREATININE: 1.04 mg/dL — AB (ref 0.57–1.00)
Chloride: 99 mmol/L (ref 96–106)
GFR calc Af Amer: 67 mL/min/{1.73_m2} (ref >59)
GFR calc non Af Amer: 59 mL/min/{1.73_m2} — ABNORMAL LOW (ref >59)
GLUCOSE: 150 mg/dL — AB (ref 65–99)
Potassium: 4.1 mmol/L (ref 3.5–5.2)
SODIUM: 140 mmol/L (ref 134–144)

## 2018-03-07 NOTE — Telephone Encounter (Signed)
Message   I am experiencing extreme fatigue.   Received the above message from pt in [place].  Called and spoke with patient who reports yesterday she became really tired while at work.  Today is went to work and came home early d/t fatigue.  She reports not sleeping well for the last several nights.  She sleeps with 2 pillows and does not lay flat.  She reports just tossing and turning and she doesn't know why.  May be a little more SOB than normal but denies any swelling.  She does not weigh daily.  She denies caffeine use but does states she keeps the TV on all the time.  She is going to start turning the TV before bed.   Advised I will forward this information to [facility] for review but not sure her fatigue is r/t anything going on with her heart since she has no other s/s.  Advised her to try taking a relaxing, warm bath with epsom salts tonight prior to trying to sleep.  She felt that was a great idea and will try that.  Aware someone will call back with any further recommendations once Scott reviews.

## 2018-03-08 NOTE — Telephone Encounter (Signed)
Left message for pt to call office about Scott's recommendations.

## 2018-03-08 NOTE — Telephone Encounter (Signed)
She can follow up with primary care for evaluation and management of insomnia. Melatonin over the counter may help. No caffeine, exercise within 2-3 hours of bed.  No TV, reading, computer in bed. Richardson Dopp, PA-C    03/08/2018 7:47 AM

## 2018-03-11 ENCOUNTER — Other Ambulatory Visit (INDEPENDENT_AMBULATORY_CARE_PROVIDER_SITE_OTHER): Payer: 59

## 2018-03-11 DIAGNOSIS — E1165 Type 2 diabetes mellitus with hyperglycemia: Secondary | ICD-10-CM

## 2018-03-11 DIAGNOSIS — Z794 Long term (current) use of insulin: Secondary | ICD-10-CM | POA: Diagnosis not present

## 2018-03-11 LAB — COMPREHENSIVE METABOLIC PANEL
ALBUMIN: 4.2 g/dL (ref 3.5–5.2)
ALK PHOS: 115 U/L (ref 39–117)
ALT: 22 U/L (ref 0–35)
AST: 22 U/L (ref 0–37)
BILIRUBIN TOTAL: 0.3 mg/dL (ref 0.2–1.2)
BUN: 16 mg/dL (ref 6–23)
CALCIUM: 9.8 mg/dL (ref 8.4–10.5)
CO2: 29 mEq/L (ref 19–32)
CREATININE: 1.04 mg/dL (ref 0.40–1.20)
Chloride: 102 mEq/L (ref 96–112)
GFR: 69.49 mL/min (ref >60.00)
Glucose, Bld: 225 mg/dL — ABNORMAL HIGH (ref 70–99)
Potassium: 4 mEq/L (ref 3.5–5.1)
Sodium: 139 mEq/L (ref 135–145)
TOTAL PROTEIN: 8 g/dL (ref 6.0–8.3)

## 2018-03-11 LAB — MICROALBUMIN / CREATININE URINE RATIO
CREATININE, U: 193.4 mg/dL
Microalb Creat Ratio: 0.4 mg/g (ref 0.0–30.0)
Microalb, Ur: <0.7 mg/dL (ref 0.0–1.9)

## 2018-03-11 LAB — HEMOGLOBIN A1C: Hgb A1c MFr Bld: 8.8 % — ABNORMAL HIGH (ref 4.6–6.5)

## 2018-03-11 NOTE — Telephone Encounter (Signed)
Left message for pt to call our office back for results.

## 2018-03-12 ENCOUNTER — Other Ambulatory Visit: Payer: Self-pay | Admitting: Physician Assistant

## 2018-03-13 NOTE — Progress Notes (Signed)
Patient ID: Destiny Beasley, female   DOB: 1955-07-26, 62 y.o.   MRN: [id number]   Reason for Appointment: Type II Diabetes follow-up   History of Present Illness    Date of diagnosis: 10/2008  Previous history: She had markedly increased blood sugars at diagnosis and was tried on oral hypoglycemic drugs and Victoza for about 9 months before starting an insulin.  Since 2011 she had been on basal bolus insulin regimen Previously had had difficulty controlling her diabetes mostly because of difficulty with compliance with various aspects of self-care.  Previous regimen: Toujeo 60 units at 8 am . Apidra 20 units at times  She was started on the Omnipod insulin pump on 09/11/16  Recent history:   Insulin regimen:  Omnipod insulin pump settings currently: Basal rates: 12 AM = 0.9, 8 AM = 1.0, 3 PM = 1.3 and 10 PM = 0.6  Carbohydrate coverage 1: 20 and active insulin time 4 hours with target 100 Sensitivity 1: 15  Non-insulin hypoglycemic drugs: Victoza 1.8 mg daily   Her A1c has been consistently high and last 8.8 and not much better   Fructosamine previously was mildly increased at 298         Current management, blood sugar patterns and problems:  She has been very erratic with checking her blood sugars  She does some readings in the morning and some randomly after eating  Her diet has been still very high in carbohydrate content and on some days will have as much this 310 g of carbohydrate in the day  She will not check her sugar at the time of her BOLUS and mostly checking sugars after eating but not before  Not clear if her basal rate is adequate because most of her sugars are done randomly right after eating  FASTING readings are fairly good overall with only a couple of high readings earlier this month  Hypoglycemia has been rare with only 1 or 2 low blood sugars late at night  She is using a higher BASAL rate compared to the last visit even with using the  Humalog U-200  She is able to fill her pump with 1.5 mL of this and the pump will last her up to 3 days  She is not always balancing her meals and not clear if she is counting carbohydrates accurately  She appears to have a consistent weight but needs to come down further  Side effects from medications: Candidiasis from Invokana   PRE-MEAL Fasting Lunch Dinner Bedtime Overall  Glucose range:  113-186 ?     62-272  Mean/median:      172   POST-MEAL PC Breakfast PC Lunch PC Dinner  Glucose range:   196-228  64-272  Mean/median:        Exercise:  Water aerobics 3/7    Dietician visit: Most recent: 04/2010 Weight control:  Wt Readings from Last 3 Encounters:  03/14/18 252 lb (114.3 kg)  03/01/18 254 lb 6.4 oz (115.4 kg)  02/11/18 257 lb (116.6 kg)         Diabetes labs:  Lab Results  Component Value Date   HGBA1C 8.8 (H) 03/11/2018   HGBA1C 9.0 (H) 11/07/2017   HGBA1C 7.9 (H) 08/03/2017   Lab Results  Component Value Date   MICROALBUR <0.7 03/11/2018   LDLCALC 60 11/13/2017   CREATININE 1.04 03/11/2018      No visits with results within 1 Day(s) from this visit.  Latest known visit with  results is:  Lab on 03/11/2018  Component Date Value Ref Range Status  . Hgb A1c MFr Bld 03/11/2018 8.8* 4.6 - 6.5 % Final   Glycemic Control Guidelines for People with Diabetes:Non Diabetic:  <6%Goal of Therapy: <7%Additional Action Suggested:  >8%   . Sodium 03/11/2018 139  135 - 145 mEq/L Final  . Potassium 03/11/2018 4.0  3.5 - 5.1 mEq/L Final  . Chloride 03/11/2018 102  96 - 112 mEq/L Final  . CO2 03/11/2018 29  19 - 32 mEq/L Final  . Glucose, Bld 03/11/2018 225* 70 - 99 mg/dL Final  . BUN 03/11/2018 16  6 - 23 mg/dL Final  . Creatinine, Ser 03/11/2018 1.04  0.40 - 1.20 mg/dL Final  . Total Bilirubin 03/11/2018 0.3  0.2 - 1.2 mg/dL Final  . Alkaline Phosphatase 03/11/2018 115  39 - 117 U/L Final  . AST 03/11/2018 22  0 - 37 U/L Final  . ALT 03/11/2018 22  0 - 35 U/L  Final  . Total Protein 03/11/2018 8.0  6.0 - 8.3 g/dL Final  . Albumin 03/11/2018 4.2  3.5 - 5.2 g/dL Final  . Calcium 03/11/2018 9.8  8.4 - 10.5 mg/dL Final  . GFR 03/11/2018 69.49  >60.00 mL/min Final  . Microalb, Ur 03/11/2018 <0.7  0.0 - 1.9 mg/dL Final  . Creatinine,U 03/11/2018 193.4  mg/dL Final  . Microalb Creat Ratio 03/11/2018 0.4  0.0 - 30.0 mg/g Final     Allergies as of 03/14/2018      Reactions   Hydrocodone Other (See Comments)   Severe anxiety Confusion   Invokana [canagliflozin] Other (See Comments)   Constant yeast infections   Imdur [isosorbide Nitrate] Other (See Comments)   Patient reported wheezing   Atorvastatin Other (See Comments)   DIZZINESS      Medication List        Accurate as of 03/14/18  9:52 AM. Always use your most recent med list.          aspirin 81 MG EC tablet Take 1 tablet (81 mg total) by mouth daily.   DULERA 100-5 MCG/ACT Aero Generic drug:  mometasone-formoterol Inhale 2 puffs into the lungs 2 (two) times daily as needed for wheezing or shortness of breath (allergies).   Evolocumab 140 MG/ML Soaj Inject 140 mg into the skin every 14 (fourteen) days.   FREESTYLE TEST STRIPS test strip Generic drug:  glucose blood USE THREE TIMES DAILY TO CHECK BLOOD SUGAR   furosemide 20 MG tablet Commonly known as:  LASIX Take 1 tablet (20 mg total) by mouth daily.   Insulin Degludec 200 UNIT/ML Sopn Inject 70 Units into the skin daily.   Insulin Lispro 200 UNIT/ML Sopn Inject 70 Units into the skin 3 (three) times daily.   Insulin Pen Needle 31G X 5 MM Misc USE TO INJECT INSULIN FOUR TIMES DAILY   irbesartan 75 MG tablet Commonly known as:  AVAPRO Take 1 tablet (75 mg total) by mouth daily.   liraglutide 18 MG/3ML Sopn Commonly known as:  VICTOZA ADMINISTER 1.8 MG UNDER THE SKIN EVERY DAY   metoprolol tartrate 25 MG tablet Commonly known as:  LOPRESSOR Take 25 mg by mouth 2 (two) times daily.   MULTIPLE VITAMIN PO Take  1 tablet daily by mouth.   nitroGLYCERIN 0.4 MG SL tablet Commonly known as:  NITROSTAT Place 1 tablet (0.4 mg total) under the tongue every 5 (five) minutes as needed for chest pain (up to 3 doses. If taking 3rd dose  call 911).   spironolactone 25 MG tablet Commonly known as:  ALDACTONE Take 0.5 tablets (12.5 mg total) by mouth daily.   ticagrelor 90 MG Tabs tablet Commonly known as:  BRILINTA Take 1 tablet (90 mg total) by mouth 2 (two) times daily.   venlafaxine XR 150 MG 24 hr capsule Commonly known as:  EFFEXOR-XR Take 150 mg by mouth daily.   VITAMIN D-3 PO Take by mouth.       Allergies:  Allergies  Allergen Reactions  . Hydrocodone Other (See Comments)    Severe anxiety Confusion   . Invokana [Canagliflozin] Other (See Comments)    Constant yeast infections  . Imdur [Isosorbide Nitrate] Other (See Comments)    Patient reported wheezing  . Atorvastatin Other (See Comments)    DIZZINESS    Past Medical History:  Diagnosis Date  . Anemia   . Anxiety   . CAD in native artery    a. Inf STEMI 12/2424 complicated by cardiogenic shock/complete heart block s/p emergent cath showing culprit large dominant LCx s/p aspiration thrombectomy and DES, EF 50% by cath, 40-45% by echo.  . Chronic systolic heart failure ([place]) 06/25/2017   Echo 11/19: mild LVH, EF 40-45, inf-lat HK, Gr 1 DD, trivial MR  . Complete heart block, transient ([place])    a. 06/2017 in setting of acute inf MI -> resolved after PCI.  . Diabetes mellitus   . Goiter   . History of radiation therapy 06/14/2016 - 07/19/2016   Right Breast 50 Gy 25 fractions  . Hypercholesteremia   . Hypertension   . Ischemic cardiomyopathy    a. EF 50% by cath and 40-45% by echo 06/2017.  . Malignant neoplasm of upper-outer quadrant of right female breast ([place]) 02/24/2016  . Meningioma ([place])   . Nuclear stress test    Nuclear stress test 10/19: EF 35, inferolateral, apical scar; inferior, apical inferior infarct with mild  peri-infarct ischemia; high risk  . Obesity   . Personal history of chemotherapy   . Personal history of radiation therapy   . Pituitary tumor   . Seasonal asthma   . Sleep apnea    does not use every night  . Stroke [facility])    TIA - Dec 1 st, 2017    Past Surgical History:  Procedure Laterality Date  . ABDOMINAL HYSTERECTOMY    . BRAIN MENINGIOMA EXCISION  2005  . BREAST BIOPSY    . BREAST LUMPECTOMY Right    2017  . BREAST LUMPECTOMY WITH RADIOACTIVE SEED LOCALIZATION Right 04/20/2016   Procedure: RIGHT BREAST LUMPECTOMY WITH RADIOACTIVE SEED LOCALIZATION;  Surgeon: Fanny Skates, MD;  Location: [place];  Service: General;  Laterality: Right;  . BREAST REDUCTION SURGERY Bilateral 10/23/2016   Procedure: BILATERAL BREAST REDUCTION WITH LIPOSUCTION ASSISTANCE;  Surgeon: Cristine Polio, MD;  Location: [place];  Service: Plastics;  Laterality: Bilateral;  . CESAREAN SECTION    . CORONARY STENT INTERVENTION N/A 06/25/2017   Procedure: CORONARY STENT INTERVENTION;  Surgeon: Sherren Mocha, MD;  Location: [place] CV LAB;  Service: Cardiovascular;  Laterality: N/A;  . CORONARY/GRAFT ACUTE MI REVASCULARIZATION N/A 06/25/2017   Procedure: Coronary/Graft Acute MI Revascularization;  Surgeon: Sherren Mocha, MD;  Location: [place] CV LAB;  Service: Cardiovascular;  Laterality: N/A;  . FOOT SURGERY Bilateral 2016   hammer toe surgery  . LEFT HEART CATH AND CORONARY ANGIOGRAPHY N/A 06/25/2017   Procedure: LEFT HEART CATH AND CORONARY ANGIOGRAPHY;  Surgeon: Sherren Mocha, MD;  Location: [place] CV LAB;  Service: Cardiovascular;  Laterality: N/A;  . MENISCUS REPAIR Left 2015  . PITUITARY SURGERY    . REDUCTION MAMMAPLASTY    . RIGHT HEART CATH N/A 06/25/2017   Procedure: RIGHT HEART CATH;  Surgeon: Sherren Mocha, MD;  Location: [place] CV LAB;  Service: Cardiovascular;  Laterality: N/A;  . THYROID SURGERY      Family History  Problem  Relation Age of Onset  . Diabetes Mother   . Hyperlipidemia Mother   . Hypertension Mother     Social History:  reports that she has never smoked. She has never used smokeless tobacco. She reports that she drinks about 1.0 standard drinks of alcohol per week. She reports that she does not use drugs.  Review of Systems:  Hypertension:  Fair control, blood pressure is managed by cardiologist  BP Readings from Last 3 Encounters:  03/14/18 (!) 152/86  03/01/18 (!) 142/76  01/25/18 136/72     Lipids: She was able to start [place] in 08/2017  LDL below 70 However now she is apparently being denied the medication by her insurance and they need another prior authorization  Lab Results  Component Value Date   CHOL 129 11/13/2017   HDL 48.40 11/13/2017   LDLCALC 60 11/13/2017   TRIG 104.0 11/13/2017   CHOLHDL 3 11/13/2017   Lab Results  Component Value Date   ALT 22 03/11/2018    Diabetic shoe prescription has been given previously     Examination:   BP (!) 152/86   Pulse 86   Ht 6' (1.829 m)   Wt 252 lb (114.3 kg)   SpO2 98%   BMI 34.18 kg/m   Body mass index is 34.18 kg/m.     ASSESSMENT/ PLAN:    Diabetes type 2 with obesity See history of present illness for detailed discussion of current management, blood sugar patterns and problems identified  Her A1c has usually been consistently high and now 8.8  Although she is using less insulin using the insulin pump and the U-200 insulin her control has been persistently poor Most of her high readings are after meals but usually after eating Also not clear if she is having balanced meals  Recommendations today:   Start checking blood sugars before every meal and some after meals about 2 hours later Discussed blood sugar targets at various times Advised her that if her blood sugar is rising more than about 50 mg she may need a change in carbohydrate ratio Currently without adequate monitoring not clear if she  needs a change in basal rate However because most of her readings in the late afternoon and evening are high she will go up to 1.6 units on her basal rate after 12 noon Consultation with dietitian  HYPERTENSION: She needs to discuss her management with her PCP and cardiologist as blood pressure is getting higher Also may benefit from consultation with dietitian   LIPIDS: To get reauthorization for her [place] since with this she has had excellent control  Counseling time on subjects discussed in assessment and plan sections is over 50% of today's 25 minute visit    There are no Patient Instructions on file for this visit. Elayne Snare 03/14/2018, 9:52 AM   Counseling time on subjects discussed in assessment and plan sections is over 50% of today's 25 minute visit

## 2018-03-14 ENCOUNTER — Encounter: Payer: Self-pay | Admitting: Endocrinology

## 2018-03-14 ENCOUNTER — Ambulatory Visit (INDEPENDENT_AMBULATORY_CARE_PROVIDER_SITE_OTHER): Payer: 59 | Admitting: Endocrinology

## 2018-03-14 ENCOUNTER — Other Ambulatory Visit: Payer: 59

## 2018-03-14 VITALS — BP 152/86 | HR 86 | Ht 72.0 in | Wt 252.0 lb

## 2018-03-14 DIAGNOSIS — Z794 Long term (current) use of insulin: Secondary | ICD-10-CM

## 2018-03-14 DIAGNOSIS — E7800 Pure hypercholesterolemia, unspecified: Secondary | ICD-10-CM

## 2018-03-14 DIAGNOSIS — I10 Essential (primary) hypertension: Secondary | ICD-10-CM

## 2018-03-14 DIAGNOSIS — E1165 Type 2 diabetes mellitus with hyperglycemia: Secondary | ICD-10-CM

## 2018-03-14 NOTE — Telephone Encounter (Signed)
Pt is here for OV today--notified pt have not seen any paper work from [place] and pt stated called her insurance should sending another paper work to the office.

## 2018-03-15 ENCOUNTER — Ambulatory Visit: Payer: 59 | Admitting: Physician Assistant

## 2018-03-18 NOTE — Telephone Encounter (Signed)
Pt has not called back to discuss her concerns.  She has been seen and evaluated by Dr Dwyane Dee w/o c/o fatigue noted at that visit.  Will close encounter and await return contact by patient.

## 2018-03-27 ENCOUNTER — Other Ambulatory Visit: Payer: Self-pay

## 2018-03-27 MED ORDER — DEXCOM G6 SENSOR MISC: 1.0000 | Freq: Every day | 3 refills | Status: DC

## 2018-03-27 MED ORDER — DEXCOM G6 RECEIVER DEVI: 1.0000 | Freq: Every day | 0 refills | Status: DC

## 2018-03-27 MED ORDER — DEXCOM G6 TRANSMITTER MISC: 1.0000 | Freq: Every day | 3 refills | Status: DC

## 2018-03-28 ENCOUNTER — Other Ambulatory Visit: Payer: Self-pay

## 2018-03-28 MED ORDER — INSULIN GLARGINE (1 UNIT DIAL) 300 UNIT/ML ~~LOC~~ SOPN: 70.0000 [IU] | PEN_INJECTOR | Freq: Every day | SUBCUTANEOUS | 2 refills | Status: DC

## 2018-04-04 ENCOUNTER — Telehealth: Payer: Self-pay

## 2018-04-04 NOTE — Telephone Encounter (Signed)
Received fax from [facility] stating that they have received the paperwork sent by this office for the pt to get Dexcom CGM. They are currently in the record retrieval process and will be notified once all records are retrieved for the pt.

## 2018-04-05 ENCOUNTER — Other Ambulatory Visit: Payer: Self-pay | Admitting: Endocrinology

## 2018-04-05 ENCOUNTER — Other Ambulatory Visit: Payer: Self-pay

## 2018-04-05 MED ORDER — INSULIN LISPRO 100 UNIT/ML ~~LOC~~ SOLN: SUBCUTANEOUS | 2 refills | Status: DC

## 2018-04-08 ENCOUNTER — Other Ambulatory Visit: Payer: Self-pay | Admitting: Endocrinology

## 2018-04-15 ENCOUNTER — Ambulatory Visit
Admission: RE | Admit: 2018-04-15 | Discharge: 2018-04-15 | Disposition: A | Discharge status: Home / Self Care | Payer: 59 | Source: Ambulatory Visit | Attending: Obstetrics and Gynecology | Admitting: Obstetrics and Gynecology

## 2018-04-15 DIAGNOSIS — Z853 Personal history of malignant neoplasm of breast: Secondary | ICD-10-CM

## 2018-04-23 ENCOUNTER — Other Ambulatory Visit: Payer: Self-pay | Admitting: Neurosurgery

## 2018-04-23 DIAGNOSIS — D320 Benign neoplasm of cerebral meninges: Secondary | ICD-10-CM

## 2018-06-11 ENCOUNTER — Other Ambulatory Visit (INDEPENDENT_AMBULATORY_CARE_PROVIDER_SITE_OTHER): Payer: 59

## 2018-06-11 DIAGNOSIS — Z794 Long term (current) use of insulin: Secondary | ICD-10-CM | POA: Diagnosis not present

## 2018-06-11 DIAGNOSIS — E1165 Type 2 diabetes mellitus with hyperglycemia: Secondary | ICD-10-CM

## 2018-06-11 DIAGNOSIS — E7800 Pure hypercholesterolemia, unspecified: Secondary | ICD-10-CM

## 2018-06-11 LAB — COMPREHENSIVE METABOLIC PANEL
ALBUMIN: 4.1 g/dL (ref 3.5–5.2)
ALT: 19 U/L (ref 0–35)
AST: 20 U/L (ref 0–37)
Alkaline Phosphatase: 115 U/L (ref 39–117)
BUN: 16 mg/dL (ref 6–23)
CO2: 27 mEq/L (ref 19–32)
Calcium: 9.7 mg/dL (ref 8.4–10.5)
Chloride: 104 mEq/L (ref 96–112)
Creatinine, Ser: 0.92 mg/dL (ref 0.40–1.20)
GFR: 75.25 mL/min (ref >60.00)
Glucose, Bld: 219 mg/dL — ABNORMAL HIGH (ref 70–99)
Potassium: 4.3 mEq/L (ref 3.5–5.1)
Sodium: 139 mEq/L (ref 135–145)
Total Bilirubin: 0.3 mg/dL (ref 0.2–1.2)
Total Protein: 7.7 g/dL (ref 6.0–8.3)

## 2018-06-11 LAB — LIPID PANEL
Cholesterol: 197 mg/dL (ref 0–200)
HDL: 46.7 mg/dL (ref >39.00)
LDL Cholesterol: 132 mg/dL — ABNORMAL HIGH (ref 0–99)
NonHDL: 150.35
Total CHOL/HDL Ratio: 4
Triglycerides: 91 mg/dL (ref 0.0–149.0)
VLDL: 18.2 mg/dL (ref 0.0–40.0)

## 2018-06-11 LAB — HEMOGLOBIN A1C: Hgb A1c MFr Bld: 8.3 % — ABNORMAL HIGH (ref 4.6–6.5)

## 2018-06-11 LAB — MICROALBUMIN / CREATININE URINE RATIO
CREATININE, U: 199.5 mg/dL
Microalb Creat Ratio: 0.5 mg/g (ref 0.0–30.0)
Microalb, Ur: 0.9 mg/dL (ref 0.0–1.9)

## 2018-06-14 ENCOUNTER — Encounter: Payer: Self-pay | Admitting: Endocrinology

## 2018-06-14 ENCOUNTER — Ambulatory Visit: Payer: 59 | Admitting: Endocrinology

## 2018-06-14 VITALS — BP 126/80 | HR 84 | Ht 72.0 in | Wt 254.2 lb

## 2018-06-14 DIAGNOSIS — E7800 Pure hypercholesterolemia, unspecified: Secondary | ICD-10-CM

## 2018-06-14 DIAGNOSIS — I10 Essential (primary) hypertension: Secondary | ICD-10-CM

## 2018-06-14 DIAGNOSIS — E1165 Type 2 diabetes mellitus with hyperglycemia: Secondary | ICD-10-CM

## 2018-06-14 DIAGNOSIS — Z794 Long term (current) use of insulin: Secondary | ICD-10-CM | POA: Diagnosis not present

## 2018-06-14 NOTE — Progress Notes (Signed)
Patient ID: Destiny Beasley, female   DOB: 21-Jan-1956, 63 y.o.   MRN: [id number]   Reason for Appointment: Type II Diabetes follow-up   History of Present Illness    Date of diagnosis: 10/2008  Previous history: She had markedly increased blood sugars at diagnosis and was tried on oral hypoglycemic drugs and Victoza for about 9 months before starting an insulin.  Since 2011 she had been on basal bolus insulin regimen Previously had had difficulty controlling her diabetes mostly because of difficulty with compliance with various aspects of self-care.  Previous regimen: Toujeo 60 units at 8 am . Apidra 20 units at times  She was started on the Omnipod insulin pump on 09/11/16  Recent history:   Insulin regimen:  Omnipod insulin pump settings currently: Basal rates: 12 AM = 0.9, 8 AM = 1.0, 3 PM = 1.3 and 10 PM = 0.6  Carbohydrate coverage 1: 20 and active insulin time 4 hours with target 100 Sensitivity 1: 15  Non-insulin hypoglycemic drugs: Victoza 1.8 mg daily   Her A1c has been consistently high although slightly better at 8.3 compared to 8.8  Fructosamine previously was mildly increased at 298         Current management, blood sugar patterns and problems:  She is now using the Dexcom sensor for the last 6 to 8 weeks  However she thinks that the sensor is reading falsely higher or lower by 10-20 mg  Also she has difficulty getting the connection to be consistent with her reader although recently her tracings are fairly complete with 94% data availability  At the same time she is only rarely checking her blood sugars with her Omni pod meter and she is not sure if she is using strips that are not expired  She has not exercised Review of her pump download shows she has several missing boluses around lunchtime and occasionally at dinnertime also   Her blood sugar patterns are discussed below  However she does appear to have fairly consistently high blood sugars  between midmorning till late afternoon  Also do not appear to be doing correction boluses consistently when she is having high readings  At breakfast time.  Usually having only coffee and a boiled egg and not bolusing for this  Although during the day her blood sugars are persistently high even after some boluses she did have a significant drop in blood sugar after correcting the blood sugar last night at 2 AM resulting in blood sugar go down to 83  No hypoglycemia except transiently as seen on the sensor  She was asked to see the dietitian but she did not make the appointment  Side effects from medications: Candidiasis from [place]    Dates of Recording: 2/8 through 06/14/2018  Sensor description: Dexcom G6  Results statistics:   CGM use % of time  94  Average and SD  203+/-52  Time in range     31 %  % Time Above 180  68  % Time above 250  17  % Time Below target  0.6    Glycemic patterns summary: She had a pattern of nighttime highs especially between 12 AM and 2:30 AM and also daytime highs, mostly significant between 9 AM and 4 PM.  There was more variability in the late afternoon and evenings and also early morning but mostly persistently HIGH blood sugars during the day  Hyperglycemic episodes these have been  frequent during the night but more consistently between about 4 PM and periodically  Hypoglycemic episodes occurred very transiently at around 6 PM, also once midnight  Overnight periods: Sugars are mostly significantly high throughout the night.  A couple of readings that were improving after 2-3 AM from correction doses getting lower transiently around midnight once  Preprandial periods: Blood sugars are fairly consistently high at breakfast and lunch and variably high around dinnertime with average blood sugar around midday to be 225  Postprandial periods: After breakfast: She has mild and variable  hyperglycemia    After lunch: Blood sugars tend to rise only modestly After dinner: Blood sugars are quite variable with increased on some days and decreased on other days.  Because of her variable mealtimes difficult to identify patterns   PREVIOUS readings  PRE-MEAL Fasting Lunch Dinner Bedtime Overall  Glucose range:  113-186 ?     62-272  Mean/median:      172   POST-MEAL PC Breakfast PC Lunch PC Dinner  Glucose range:   196-228  64-272  Mean/median:        Exercise: Previously doing water aerobics 3/7    Dietician visit: Most recent: 04/2010 Weight control:  Wt Readings from Last 3 Encounters:  06/14/18 254 lb 3.2 oz (115.3 kg)  03/14/18 252 lb (114.3 kg)  03/01/18 254 lb 6.4 oz (115.4 kg)         Diabetes labs:  Lab Results  Component Value Date   HGBA1C 8.3 (H) 06/11/2018   HGBA1C 8.8 (H) 03/11/2018   HGBA1C 9.0 (H) 11/07/2017   Lab Results  Component Value Date   MICROALBUR 0.9 06/11/2018   LDLCALC 132 (H) 06/11/2018   CREATININE 0.92 06/11/2018      No visits with results within 1 Day(s) from this visit.  Latest known visit with results is:  Lab on 06/11/2018  Component Date Value Ref Range Status  . Sodium 06/11/2018 139  135 - 145 mEq/L Final  . Potassium 06/11/2018 4.3  3.5 - 5.1 mEq/L Final  . Chloride 06/11/2018 104  96 - 112 mEq/L Final  . CO2 06/11/2018 27  19 - 32 mEq/L Final  . Glucose, Bld 06/11/2018 219* 70 - 99 mg/dL Final  . BUN 06/11/2018 16  6 - 23 mg/dL Final  . Creatinine, Ser 06/11/2018 0.92  0.40 - 1.20 mg/dL Final  . Total Bilirubin 06/11/2018 0.3  0.2 - 1.2 mg/dL Final  . Alkaline Phosphatase 06/11/2018 115  39 - 117 U/L Final  . AST 06/11/2018 20  0 - 37 U/L Final  . ALT 06/11/2018 19  0 - 35 U/L Final  . Total Protein 06/11/2018 7.7  6.0 - 8.3 g/dL Final  . Albumin 06/11/2018 4.1  3.5 - 5.2 g/dL Final  . Calcium 06/11/2018 9.7  8.4 - 10.5 mg/dL Final  . GFR 06/11/2018 75.25  >60.00 mL/min Final  . Cholesterol 06/11/2018  197  0 - 200 mg/dL Final   ATP III Classification       Desirable:  < 200 mg/dL               Borderline High:  200 - 239 mg/dL          High:  > = 240 mg/dL  . Triglycerides 06/11/2018 91.0  0.0 - 149.0 mg/dL Final   Normal:  <150 mg/dLBorderline High:  150 - 199 mg/dL  . HDL 06/11/2018 46.70  >39.00 mg/dL Final  . VLDL 06/11/2018 18.2  0.0 - 40.0  mg/dL Final  . LDL Cholesterol 06/11/2018 132* 0 - 99 mg/dL Final  . Total CHOL/HDL Ratio 06/11/2018 4   Final                  Men          Women1/2 Average Risk     3.4          3.3Average Risk          5.0          4.42X Average Risk          9.6          7.13X Average Risk          15.0          11.0                      . NonHDL 06/11/2018 150.35   Final   NOTE:  Non-HDL goal should be 30 mg/dL higher than patient's LDL goal (i.e. LDL goal of < 70 mg/dL, would have non-HDL goal of < 100 mg/dL)  . Microalb, Ur 06/11/2018 0.9  0.0 - 1.9 mg/dL Final  . Creatinine,U 06/11/2018 199.5  mg/dL Final  . Microalb Creat Ratio 06/11/2018 0.5  0.0 - 30.0 mg/g Final  . Hgb A1c MFr Bld 06/11/2018 8.3* 4.6 - 6.5 % Final   Glycemic Control Guidelines for People with Diabetes:Non Diabetic:  <6%Goal of Therapy: <7%Additional Action Suggested:  >8%      Allergies as of 06/14/2018      Reactions   Hydrocodone Other (See Comments)   Severe anxiety Confusion   Invokana [canagliflozin] Other (See Comments)   Constant yeast infections   Imdur [isosorbide Nitrate] Other (See Comments)   Patient reported wheezing   Atorvastatin Other (See Comments)   DIZZINESS      Medication List       Accurate as of June 14, 2018 10:10 AM. Always use your most recent med list.        aspirin 81 MG EC tablet Take 1 tablet (81 mg total) by mouth daily.   DEXCOM G6 RECEIVER Devi 1 each by Does not apply route daily.   DEXCOM G6 SENSOR Misc 1 each by Does not apply route daily. Apply Dexcom sensor to body and change every 10 days.   DEXCOM G6 TRANSMITTER Misc 1  each by Does not apply route daily.   DULERA 100-5 MCG/ACT Aero Generic drug:  mometasone-formoterol Inhale 2 puffs into the lungs 2 (two) times daily as needed for wheezing or shortness of breath (allergies).   Evolocumab 140 MG/ML Soaj Commonly known as:  REPATHA SURECLICK Inject 409 mg into the skin every 14 (fourteen) days.   FREESTYLE TEST STRIPS test strip Generic drug:  glucose blood USE THREE TIMES DAILY TO CHECK BLOOD SUGAR   furosemide 20 MG tablet Commonly known as:  LASIX Take 1 tablet (20 mg total) by mouth daily.   insulin lispro 100 UNIT/ML injection Commonly known as:  HUMALOG INJECT 85 UNITS UNDER THE SKIN DAILY PER PUMP   Insulin Pen Needle 31G X 5 MM Misc Commonly known as:  B-D UF III MINI PEN NEEDLES USE TO INJECT INSULIN FOUR TIMES DAILY   irbesartan 75 MG tablet Commonly known as:  AVAPRO Take 1 tablet (75 mg total) by mouth daily.   liraglutide 18 MG/3ML Sopn Commonly known as:  VICTOZA INJECT 1.8 MG UNDER THE SKIN EVERY DAY   metoprolol tartrate  25 MG tablet Commonly known as:  LOPRESSOR Take 25 mg by mouth 2 (two) times daily.   MULTIPLE VITAMIN PO Take 1 tablet daily by mouth.   nitroGLYCERIN 0.4 MG SL tablet Commonly known as:  NITROSTAT Place 1 tablet (0.4 mg total) under the tongue every 5 (five) minutes as needed for chest pain (up to 3 doses. If taking 3rd dose call 911).   spironolactone 25 MG tablet Commonly known as:  ALDACTONE Take 0.5 tablets (12.5 mg total) by mouth daily.   ticagrelor 90 MG Tabs tablet Commonly known as:  BRILINTA Take 1 tablet (90 mg total) by mouth 2 (two) times daily.   venlafaxine XR 150 MG 24 hr capsule Commonly known as:  EFFEXOR-XR Take 150 mg by mouth daily.   VITAMIN D-3 PO Take by mouth.       Allergies:  Allergies  Allergen Reactions  . Hydrocodone Other (See Comments)    Severe anxiety Confusion   . Invokana [Canagliflozin] Other (See Comments)    Constant yeast infections  .  Imdur [Isosorbide Nitrate] Other (See Comments)    Patient reported wheezing  . Atorvastatin Other (See Comments)    DIZZINESS    Past Medical History:  Diagnosis Date  . Anemia   . Anxiety   . CAD in native artery    a. Inf STEMI 07/9673 complicated by cardiogenic shock/complete heart block s/p emergent cath showing culprit large dominant LCx s/p aspiration thrombectomy and DES, EF 50% by cath, 40-45% by echo.  . Chronic systolic heart failure ([place]) 06/25/2017   Echo 11/19: mild LVH, EF 40-45, inf-lat HK, Gr 1 DD, trivial MR  . Complete heart block, transient ([place])    a. 06/2017 in setting of acute inf MI -> resolved after PCI.  . Diabetes mellitus   . Goiter   . History of radiation therapy 06/14/2016 - 07/19/2016   Right Breast 50 Gy 25 fractions  . Hypercholesteremia   . Hypertension   . Ischemic cardiomyopathy    a. EF 50% by cath and 40-45% by echo 06/2017.  . Malignant neoplasm of upper-outer quadrant of right female breast ([place]) 02/24/2016  . Meningioma ([place])   . Nuclear stress test    Nuclear stress test 10/19: EF 35, inferolateral, apical scar; inferior, apical inferior infarct with mild peri-infarct ischemia; high risk  . Obesity   . Personal history of chemotherapy   . Personal history of radiation therapy   . Pituitary tumor   . Seasonal asthma   . Sleep apnea    does not use every night  . Stroke [facility])    TIA - Dec 1 st, 2017    Past Surgical History:  Procedure Laterality Date  . ABDOMINAL HYSTERECTOMY    . BRAIN MENINGIOMA EXCISION  2005  . BREAST BIOPSY    . BREAST LUMPECTOMY Right    2017  . BREAST LUMPECTOMY WITH RADIOACTIVE SEED LOCALIZATION Right 04/20/2016   Procedure: RIGHT BREAST LUMPECTOMY WITH RADIOACTIVE SEED LOCALIZATION;  Surgeon: Fanny Skates, MD;  Location: [place];  Service: General;  Laterality: Right;  . BREAST REDUCTION SURGERY Bilateral 10/23/2016   Procedure: BILATERAL BREAST REDUCTION WITH LIPOSUCTION ASSISTANCE;   Surgeon: Cristine Polio, MD;  Location: [place];  Service: Plastics;  Laterality: Bilateral;  . CESAREAN SECTION    . CORONARY STENT INTERVENTION N/A 06/25/2017   Procedure: CORONARY STENT INTERVENTION;  Surgeon: Sherren Mocha, MD;  Location: [place] [facility];  Service: Cardiovascular;  Laterality: N/A;  .  CORONARY/GRAFT ACUTE MI REVASCULARIZATION N/A 06/25/2017   Procedure: Coronary/Graft Acute MI Revascularization;  Surgeon: Sherren Mocha, MD;  Location: [place] [facility];  Service: Cardiovascular;  Laterality: N/A;  . FOOT SURGERY Bilateral 2016   hammer toe surgery  . LEFT HEART CATH AND CORONARY ANGIOGRAPHY N/A 06/25/2017   Procedure: LEFT HEART CATH AND CORONARY ANGIOGRAPHY;  Surgeon: Sherren Mocha, MD;  Location: [place] [facility];  Service: Cardiovascular;  Laterality: N/A;  . MENISCUS REPAIR Left 2015  . PITUITARY SURGERY    . REDUCTION MAMMAPLASTY    . RIGHT HEART CATH N/A 06/25/2017   Procedure: RIGHT HEART CATH;  Surgeon: Sherren Mocha, MD;  Location: [place] [facility];  Service: Cardiovascular;  Laterality: N/A;  . THYROID SURGERY      Family History  Problem Relation Age of Onset  . Diabetes Mother   . Hyperlipidemia Mother   . Hypertension Mother     Social History:  reports that she has never smoked. She has never used smokeless tobacco. She reports current alcohol use of about 1.0 standard drinks of alcohol per week. She reports that she does not use drugs.  Review of Systems:  Hypertension:  Variable control, blood pressure is managed by cardiologist  BP Readings from Last 3 Encounters:  06/14/18 126/80  03/14/18 (!) 152/86  03/01/18 (!) 142/76     Lipids: She was able to start Repatha in 08/2017 However she now says that she has not taken it for a few weeks because she thought it was making her left arm hurt  LDL below 70 previously and now significantly higher She does have a history of MI  Lab Results  Component Value Date    CHOL 197 06/11/2018   HDL 46.70 06/11/2018   LDLCALC 132 (H) 06/11/2018   TRIG 91.0 06/11/2018   CHOLHDL 4 06/11/2018   Lab Results  Component Value Date   ALT 19 06/11/2018    Diabetic shoe prescription has been given previously     Examination:   BP 126/80 (BP Location: Left Arm, Patient Position: Sitting, Cuff Size: Normal)   Pulse 84   Ht 6' (1.829 m)   Wt 254 lb 3.2 oz (115.3 kg)   SpO2 96%   BMI 34.48 kg/m   Body mass index is 34.48 kg/m.     ASSESSMENT/ PLAN:    Diabetes type 2 with obesity See history of present illness for detailed discussion of current management, interpretation of her continuous glucose monitoring record, blood sugar patterns and problems identified  Her A1c has usually been consistently high and now 8.2  She is using Humulin U-200 and the OmniPod pump  Her A1c is gradually coming down Now finally with the help of continuous glucose monitoring using the Dexcom her blood sugar patterns are better identified She clearly needs more insulin during the day between about 9 AM-4 PM Also has fairly consistently high readings through the night but mostly in the early part Blood sugars are variable in the evenings However postprandial readings are difficult to identify as she usually has high readings before her meals Also difficult to know whether she is doing consistent boluses when she needs to, has several missing boluses around lunchtime and occasionally at dinnertime also   Recommendations today:   Change basal rates as follows Midnight = 0.8, 7 AM = 1.45, 11 AM = 1.35, 4 PM = 1.65 and 9 PM = 0.8 Sensitivity 1: 30 overnight Bolus for every meal and carbohydrate intake Also in  the morning for coffee she will need to enter at least 20 to 30 g of carbohydrate Restart exercise She will discuss the accuracy of her Dexcom with the manufacturer She will make sure that her test strips with the meter are not expired  HYPERTENSION: She has better  control recently   LIPIDS: To start back on Repatha, discussed that this is very unlikely to be causing isolated left arm pain and it is very important that she be on treatment for hyperlipidemia with her MI history   Follow-up in 2 months  Counseling time on subjects discussed in assessment and plan sections is over 50% of today's 25 minute visit    There are no Patient Instructions on file for this visit. Elayne Snare 06/14/2018, 10:10 AM   Counseling time on subjects discussed in assessment and plan sections is over 50% of today's 25 minute visit

## 2018-06-14 NOTE — Patient Instructions (Signed)
Check test strips  Restart Repatha

## 2018-06-20 ENCOUNTER — Other Ambulatory Visit: Payer: Self-pay | Admitting: Endocrinology

## 2018-06-29 ENCOUNTER — Other Ambulatory Visit: Payer: Self-pay | Admitting: Endocrinology

## 2018-06-29 MED ORDER — EVOLOCUMAB 140 MG/ML ~~LOC~~ SOAJ: 140.0000 mg | SUBCUTANEOUS | 3 refills | Status: DC

## 2018-07-01 NOTE — Progress Notes (Signed)
Cardiology Office Note:    Date:  07/02/2018   ID:  Destiny Beasley, DOB [birth year], MRN [id number]  PCP:  Lawerance Cruel, MD  Cardiologist:  Sherren Mocha, MD   Electrophysiologist:  None   Referring MD: Lawerance Cruel, MD   Chief Complaint  Patient presents with  . Follow-up    CAD     History of Present Illness:    Destiny Beasley is a 63 y.o. female with coronary artery disease status post inferolateral STEMI in March 3220 complicated by cardiogenic shock, acute systolic heart failure and complete heart block. Cardiac catheterization demonstrated an occluded proximal left circumflex which was treated with a drug-eluting stent. EF is 40-45% by echocardiogram. Other history includes diabetes, prior TIA, sleep apnea, breast cancer, hypertension, hyperlipidemia.  A Myoview in 01/2018 demonstrated inferolateral, apical scar; inferior, apical inferior infarct with mild peri-infarct ischemia and an EF of 35%.  This was reviewed with Dr. Burt Knack.  It was felt that the findings represented her known disease and medical Rx could be continued.  If she has progressive symptoms, she would need a Cardiac Catheterization.  She was last seen in 02/2018.  A FU echo in 11/19 demonstrated stable LVF with EF 40-45.    Destiny Beasley returns for follow-up.  She is here alone.  She denies chest discomfort.  She has had chronic shortness of breath related to Brilinta since she started this medication.  This is unchanged.  She denies orthopnea, PND, leg swelling, syncope.  She does note increased bruising.  She has had a cough for the last 2 days.  She has a history of asthma and has noted increased wheezing.  She also notes some blood-tinged sputum at times.  She denies fever or recent travel.  She denies worsening shortness of breath.  Prior CV studies:   The following studies were reviewed today:  Echo 03/07/18 Mild LVH, EF 40-45, GLS -11%, inf-lat HK, Gr 1 DD, trivial MR  Nuclear stress test  02/12/18 EF 35, inferolateral, apical scar; inferior, apical inferior infarct with mild peri-infarct ischemia; high risk  Echo 06/26/17 EF 40-45, inferolateral hypokinesis, mild MR  Cardiac catheterization 06/25/17 LM luminal irregularities LAD luminal irregularities, proximal 30 LCx proximal 100 RCA irregularities EF 45-50, inferolateral akinesis PCI: 4 x 20 mm Synergy DES to the proximal LCx  Echo 03/25/16 Moderate LVH, EF 60-65, normal wall motion  Carotid US 12/17 Bilateral ICA 1-39  Nuclear stress test 7/13 1. Negative for pharmacologic-stress induced ischemia. 2. Left ventricular ejection fraction 65%.  Past Medical History:  Diagnosis Date  . Anemia   . Anxiety   . CAD in native artery    a. Inf STEMI 05/5425 complicated by cardiogenic shock/complete heart block s/p emergent cath showing culprit large dominant LCx s/p aspiration thrombectomy and DES, EF 50% by cath, 40-45% by echo.  . Chronic systolic heart failure ([place]) 06/25/2017   Echo 11/19: mild LVH, EF 40-45, inf-lat HK, Gr 1 DD, trivial MR  . Complete heart block, transient ([place])    a. 06/2017 in setting of acute inf MI -> resolved after PCI.  . Diabetes mellitus   . Goiter   . History of radiation therapy 06/14/2016 - 07/19/2016   Right Breast 50 Gy 25 fractions  . Hypercholesteremia   . Hypertension   . Ischemic cardiomyopathy    a. EF 50% by cath and 40-45% by echo 06/2017.  . Malignant neoplasm of upper-outer quadrant of right female breast ([place]) 02/24/2016  . Meningioma (  [place])   . Nuclear stress test    Nuclear stress test 10/19: EF 35, inferolateral, apical scar; inferior, apical inferior infarct with mild peri-infarct ischemia; high risk  . Obesity   . Personal history of chemotherapy   . Personal history of radiation therapy   . Pituitary tumor   . Seasonal asthma   . Sleep apnea    does not use every night  . Stroke [facility])    TIA - Dec 1 st, 2017   Surgical Hx: The patient  has a past surgical  history that includes Cesarean section; Abdominal hysterectomy; Thyroid surgery; Pituitary surgery; Brain meningioma excision (2005); Meniscus repair (Left, 2015); Foot surgery (Bilateral, 2016); Breast lumpectomy with radioactive seed localization (Right, 04/20/2016); Breast reduction surgery (Bilateral, 10/23/2016); Breast lumpectomy (Right); Breast biopsy; Reduction mammaplasty; LEFT HEART CATH AND CORONARY ANGIOGRAPHY (N/A, 06/25/2017); RIGHT HEART CATH (N/A, 06/25/2017); Coronary/Graft Acute MI Revascularization (N/A, 06/25/2017); and CORONARY STENT INTERVENTION (N/A, 06/25/2017).   Current Medications: Current Meds  Medication Sig  . aspirin EC 81 MG EC tablet Take 1 tablet (81 mg total) by mouth daily.  . Cholecalciferol (VITAMIN D-3 PO) Take by mouth.  . Continuous Blood Gluc Receiver ([place]) DEVI 1 each by Does not apply route daily.  . Continuous Blood Gluc Sensor (DEXCOM G6 SENSOR) MISC 1 each by Does not apply route daily. Apply Dexcom sensor to body and change every 10 days.  . Continuous Blood Gluc Transmit (DEXCOM G6 TRANSMITTER) MISC 1 each by Does not apply route daily.  . DULERA 100-5 MCG/ACT AERO Inhale 2 puffs into the lungs 2 (two) times daily as needed for wheezing or shortness of breath (allergies).   . Evolocumab (REPATHA SURECLICK) 671 MG/ML SOAJ Inject 140 mg into the skin every 14 (fourteen) days.  Marland Kitchen FREESTYLE TEST STRIPS test strip USE THREE TIMES DAILY TO CHECK BLOOD SUGAR  . furosemide (LASIX) 20 MG tablet Take 1 tablet (20 mg total) by mouth daily.  . insulin lispro (HUMALOG) 100 UNIT/ML injection INJECT 85 UNITS UNDER THE SKIN DAILY PER PUMP  . irbesartan (AVAPRO) 75 MG tablet Take 1 tablet (75 mg total) by mouth daily.  Marland Kitchen liraglutide (VICTOZA) 18 MG/3ML SOPN ADMINISTER 1.8 MG UNDER THE SKIN EVERY DAY  . MULTIPLE VITAMIN PO Take 1 tablet daily by mouth.  . nitroGLYCERIN (NITROSTAT) 0.4 MG SL tablet Place 1 tablet (0.4 mg total) under the tongue every 5 (five)  minutes as needed for chest pain (up to 3 doses. If taking 3rd dose call 911).  . ticagrelor (BRILINTA) 90 MG TABS tablet Take 1 tablet (90 mg total) by mouth 2 (two) times daily.  Marland Kitchen venlafaxine XR (EFFEXOR-XR) 75 MG 24 hr capsule Take 75 mg by mouth daily with breakfast.  . [DISCONTINUED] metoprolol tartrate (LOPRESSOR) 25 MG tablet Take 25 mg by mouth 2 (two) times daily.  . [DISCONTINUED] spironolactone (ALDACTONE) 25 MG tablet Take 0.5 tablets (12.5 mg total) by mouth daily.     Allergies:   Hydrocodone; Invokana [canagliflozin]; Imdur [isosorbide nitrate]; and Atorvastatin   Social History   Tobacco Use  . Smoking status: Never Smoker  . Smokeless tobacco: Never Used  Substance Use Topics  . Alcohol use: Yes    Alcohol/week: 1.0 standard drinks    Types: 1 Glasses of wine per week    Comment: 1 glass monthly  . Drug use: No     Family Hx: The patient's family history includes Diabetes in her mother; Hyperlipidemia in her mother; Hypertension in  her mother.  ROS:   Please see the history of present illness.    Review of Systems  Respiratory: Positive for cough, shortness of breath and wheezing.   Hematologic/Lymphatic: Bruises/bleeds easily.   All other systems reviewed and are negative.   EKGs/Labs/Other Test Reviewed:    EKG:  EKG is not ordered today.   Recent Labs: 12/31/2017: Hemoglobin 13.5; Platelets 353 01/25/2018: NT-Pro BNP 281 06/11/2018: ALT 19; BUN 16; Creatinine, Ser 0.92; Potassium 4.3; Sodium 139   Recent Lipid Panel Lab Results  Component Value Date/Time   CHOL 197 06/11/2018 08:56 AM   TRIG 91.0 06/11/2018 08:56 AM   HDL 46.70 06/11/2018 08:56 AM   CHOLHDL 4 06/11/2018 08:56 AM   LDLCALC 132 (H) 06/11/2018 08:56 AM     Physical Exam:    VS:  BP 140/72   Pulse 94   Ht 6' (1.829 m)   Wt 262 lb 12.8 oz (119.2 kg)   SpO2 97%   BMI 35.64 kg/m     Wt Readings from Last 3 Encounters:  07/02/18 262 lb 12.8 oz (119.2 kg)  06/14/18 254 lb 3.2  oz (115.3 kg)  03/14/18 252 lb (114.3 kg)     Physical Exam  Constitutional: She is oriented to person, place, and time. She appears well-developed and well-nourished. No distress.  HENT:  Head: Normocephalic and atraumatic.  Eyes: No scleral icterus.  Neck: Neck supple. No JVD present. No thyromegaly present.  Cardiovascular: Normal rate, regular rhythm, S1 normal, S2 normal and normal heart sounds.  No murmur heard. Pulmonary/Chest: Effort normal. She has no rales.  Abdominal: Soft. There is no hepatomegaly.  Musculoskeletal:        General: No edema.  Lymphadenopathy:    She has no cervical adenopathy.  Neurological: She is alert and oriented to person, place, and time.  Skin: Skin is warm and dry.  Psychiatric: She has a normal mood and affect.    ASSESSMENT & PLAN:    Coronary artery disease involving native coronary artery of native heart with angina pectoris [facility]) History of posterior MI in March 2947 complicated by cardiogenic shock, complete heart block and systolic heart failure.  Myoview in October 2019 demonstrated significant scar but no significant ischemia.  She is not having anginal symptoms.  She is now 1 year out from her myocardial infarction.  I will review further with Dr. Burt Knack regarding her antiplatelet therapy going forward.  She does have side effects related to Brilinta.  We will either keep her on aspirin alone or transition her to Plavix alone.  I will touch base with her once I reviewed with Dr. Burt Knack.  Continue evolocumab, beta-blocker.  Chronic systolic heart failure (HCC) NYHA 2.  EF 40-45.  Volume status stable.  Continue beta-blocker, ARB.  Essential hypertension Blood pressure above target.  Increase metoprolol to 50 mg twice a day.  Hypercholesterolemia Recent LDL above goal.  However, she was off of her PCSK9 inhibitor.  She is back on this drug now.  Hemoptysis She has had a cough with some blood-tinged sputum.  However, she has not had a  fever or worsening shortness of breath.  She is non-toxic appearing.  Her lung exam is normal.  I will send her for a chest x-ray today.  Otherwise, I have asked her to follow-up with primary care within the next 5 to 7 days, given her history of asthma.   Dispo:  Return in about 6 months (around 01/02/2019) for Routine Follow Up, w/  Dr. Burt Knack.   Medication Adjustments/Labs and Tests Ordered: Current medicines are reviewed at length with the patient today.  Concerns regarding medicines are outlined above.  Tests Ordered: Orders Placed This Encounter  Procedures  . DG Chest 2 View   Medication Changes: Meds ordered this encounter  Medications  . metoprolol tartrate (LOPRESSOR) 50 MG tablet    Sig: Take 1 tablet (50 mg total) by mouth 2 (two) times daily.    Dispense:  180 tablet    Refill:  3    Order Specific Question:   Supervising Provider    Answer:   Jerline Pain [[id number]]    Signed, Richardson Dopp, PA-C  07/02/2018 [facility] [place], [place], [place]  [address] Phone: [phone]; Fax: [phone]

## 2018-07-02 ENCOUNTER — Ambulatory Visit: Payer: 59 | Admitting: Physician Assistant

## 2018-07-02 ENCOUNTER — Ambulatory Visit
Admission: RE | Admit: 2018-07-02 | Discharge: 2018-07-02 | Disposition: A | Discharge status: Home / Self Care | Payer: 59 | Source: Ambulatory Visit | Attending: Physician Assistant | Admitting: Physician Assistant

## 2018-07-02 ENCOUNTER — Encounter: Payer: Self-pay | Admitting: Physician Assistant

## 2018-07-02 VITALS — BP 140/72 | HR 94 | Ht 72.0 in | Wt 262.8 lb

## 2018-07-02 DIAGNOSIS — I10 Essential (primary) hypertension: Secondary | ICD-10-CM

## 2018-07-02 DIAGNOSIS — R042 Hemoptysis: Secondary | ICD-10-CM

## 2018-07-02 DIAGNOSIS — I5022 Chronic systolic (congestive) heart failure: Secondary | ICD-10-CM | POA: Diagnosis not present

## 2018-07-02 DIAGNOSIS — E7800 Pure hypercholesterolemia, unspecified: Secondary | ICD-10-CM | POA: Diagnosis not present

## 2018-07-02 DIAGNOSIS — I25119 Atherosclerotic heart disease of native coronary artery with unspecified angina pectoris: Secondary | ICD-10-CM

## 2018-07-02 MED ORDER — METOPROLOL TARTRATE 50 MG PO TABS: 50.0000 mg | ORAL_TABLET | Freq: Two times a day (BID) | ORAL | 3 refills | Status: DC

## 2018-07-02 NOTE — Patient Instructions (Addendum)
Medication Instructions:   START TAKING METOPROLOL 50 MG TWICE A DAY   If you need a refill on your cardiac medications before your next appointment, please call your pharmacy.   Lab work: NONE ORDERED  TODAY    If you have labs (blood work) drawn today and your tests are completely normal, you will receive your results only by: Marland Kitchen MyChart Message (if you have MyChart) OR . A paper copy in the mail If you have any lab test that is abnormal or we need to change your treatment, we will call you to review the results.  Testing/Procedures: A chest x-ray takes a picture of the organs and structures inside the chest, including the heart, lungs, and blood vessels. This test can show several things, including, whether the heart is enlarges; whether fluid is building up in the lungs; and whether pacemaker / defibrillator leads are still in place.  ADDRESS: [place], [place], [place] [address]  Follow-Up: At [facility], you and your health needs are our priority.  As part of our continuing mission to provide you with exceptional heart care, we have created designated Provider Care Teams.  These Care Teams include your primary Cardiologist (physician) and Advanced Practice Providers (APPs -  Physician Assistants and Nurse Practitioners) who all work together to provide you with the care you need, when you need it. You will need a follow up appointment in:  6 months.  Please call our office 2 months in advance to schedule this appointment.  You may see Sherren Mocha, MD or one of the following Advanced Practice Providers on your designated Care Team: Richardson Dopp, PA-C [place], [place] . Daune Perch, NP  Any Other Special Instructions Will Be Listed Below (If Applicable).

## 2018-07-03 ENCOUNTER — Telehealth: Payer: Self-pay | Admitting: *Deleted

## 2018-07-03 ENCOUNTER — Ambulatory Visit
Admission: RE | Admit: 2018-07-03 | Discharge: 2018-07-03 | Disposition: A | Discharge status: Home / Self Care | Payer: 59 | Source: Ambulatory Visit | Attending: Neurosurgery | Admitting: Neurosurgery

## 2018-07-03 ENCOUNTER — Other Ambulatory Visit: Payer: Self-pay

## 2018-07-03 DIAGNOSIS — D320 Benign neoplasm of cerebral meninges: Secondary | ICD-10-CM

## 2018-07-03 MED ORDER — GADOBENATE DIMEGLUMINE 529 MG/ML IV SOLN
20.0000 mL | Freq: Once | INTRAVENOUS | Status: AC | PRN
  Administered 2018-07-03: 20 mL via INTRAVENOUS

## 2018-07-03 NOTE — Telephone Encounter (Signed)
-----   Message from Liliane Shi, [place] sent at 07/02/2018  3:12 PM EDT ----- CXR is normal.   Recommendations:  - Continue current medications and follow up as planned.   - Follow up with PCP in the next 5 days for cough.  Send copy of CXR to PCP.  Richardson Dopp, PA-C    07/02/2018 3:11 PM

## 2018-07-03 NOTE — Telephone Encounter (Signed)
LMOVM TO NORMAL RESULTS AND TO FOLLOW UP WITH PCP

## 2018-07-04 ENCOUNTER — Telehealth: Payer: Self-pay | Admitting: Physician Assistant

## 2018-07-04 NOTE — Telephone Encounter (Signed)
LMOVM TO STOP BIRLINTA AND REMAIN ON ASPIRIN. PT WAS ASKED TO CALL CLINIC BACK TO ASSURE SHE GOT MESSAGE

## 2018-07-04 NOTE — Telephone Encounter (Signed)
Please call the patient. I reviewed with Dr. Burt Knack. Tell the patient she can stop her Brilinta. She needs to remain on Aspirin. Richardson Dopp, PA-C    07/04/2018 4:25 PM

## 2018-07-04 NOTE — Telephone Encounter (Signed)
-----   Message from Sherren Mocha, MD sent at 07/04/2018  2:59 PM EDT ----- ASA alone is fine. Thanks Event organiser ----- Message ----- From: Sharmon Revere Sent: 07/02/2018   6:09 PM EDT To: Sherren Mocha, MD  Ronalee Belts - She is 1 year out and ready to DC Brilinta due to dyspnea.  Should I keep her on ASA alone or switch her to Plavix alone? Thanks, [employer]

## 2018-07-15 ENCOUNTER — Telehealth: Payer: Self-pay

## 2018-07-15 NOTE — Telephone Encounter (Signed)
PA submitted via [email or web address] for Victoza pen injectors.  [facility]  PA was approved.

## 2018-07-16 ENCOUNTER — Other Ambulatory Visit: Payer: Self-pay | Admitting: Endocrinology

## 2018-07-19 ENCOUNTER — Other Ambulatory Visit: Payer: Self-pay | Admitting: Endocrinology

## 2018-07-22 ENCOUNTER — Other Ambulatory Visit: Payer: Self-pay | Admitting: Physician Assistant

## 2018-08-08 ENCOUNTER — Other Ambulatory Visit: Payer: Self-pay | Admitting: Family Medicine

## 2018-08-08 DIAGNOSIS — R109 Unspecified abdominal pain: Secondary | ICD-10-CM

## 2018-08-12 ENCOUNTER — Other Ambulatory Visit: Payer: Self-pay

## 2018-08-12 ENCOUNTER — Ambulatory Visit
Admission: RE | Admit: 2018-08-12 | Discharge: 2018-08-12 | Disposition: A | Discharge status: Home / Self Care | Payer: 59 | Source: Ambulatory Visit | Attending: Family Medicine | Admitting: Family Medicine

## 2018-08-12 ENCOUNTER — Other Ambulatory Visit (INDEPENDENT_AMBULATORY_CARE_PROVIDER_SITE_OTHER): Payer: 59

## 2018-08-12 DIAGNOSIS — R109 Unspecified abdominal pain: Secondary | ICD-10-CM

## 2018-08-12 DIAGNOSIS — Z794 Long term (current) use of insulin: Secondary | ICD-10-CM | POA: Diagnosis not present

## 2018-08-12 DIAGNOSIS — E1165 Type 2 diabetes mellitus with hyperglycemia: Secondary | ICD-10-CM

## 2018-08-12 LAB — LIPID PANEL
Cholesterol: 115 mg/dL (ref 0–200)
HDL: 48.1 mg/dL (ref >39.00)
LDL Cholesterol: 47 mg/dL (ref 0–99)
NonHDL: 66.54
Total CHOL/HDL Ratio: 2
Triglycerides: 96 mg/dL (ref 0.0–149.0)
VLDL: 19.2 mg/dL (ref 0.0–40.0)

## 2018-08-12 LAB — GLUCOSE, RANDOM: Glucose, Bld: 219 mg/dL — ABNORMAL HIGH (ref 70–99)

## 2018-08-13 LAB — FRUCTOSAMINE: Fructosamine: 326 umol/L — ABNORMAL HIGH (ref 0–285)

## 2018-08-14 ENCOUNTER — Other Ambulatory Visit: Payer: Self-pay

## 2018-08-14 ENCOUNTER — Encounter: Payer: Self-pay | Admitting: Endocrinology

## 2018-08-14 ENCOUNTER — Ambulatory Visit (INDEPENDENT_AMBULATORY_CARE_PROVIDER_SITE_OTHER): Payer: 59 | Admitting: Endocrinology

## 2018-08-14 DIAGNOSIS — E7800 Pure hypercholesterolemia, unspecified: Secondary | ICD-10-CM

## 2018-08-14 DIAGNOSIS — Z794 Long term (current) use of insulin: Secondary | ICD-10-CM | POA: Diagnosis not present

## 2018-08-14 DIAGNOSIS — E1165 Type 2 diabetes mellitus with hyperglycemia: Secondary | ICD-10-CM

## 2018-08-14 NOTE — Progress Notes (Signed)
Patient ID: Destiny Beasley, female   DOB: 1955-05-10, 63 y.o.   MRN: [id number]  Today's office visit was provided via telemedicine using video technique Explained to the patient and the the limitations of evaluation and management by telemedicine and the availability of in person appointments.  The patient understood the limitations and agreed to proceed. Patient also understood that the telehealth visit is billable. . Location of the patient: Home . Location of the provider: Office Only the patient and myself were participating in the encounter   Reason for Appointment: Type II Diabetes follow-up   History of Present Illness    Date of diagnosis: 10/2008  Previous history: She had markedly increased blood sugars at diagnosis and was tried on oral hypoglycemic drugs and Victoza for about 9 months before starting an insulin.  Since 2011 she had been on basal bolus insulin regimen Previously had had difficulty controlling her diabetes mostly because of difficulty with compliance with various aspects of self-care.  Previous regimen: Toujeo 60 units at 8 am . Apidra 20 units at times  She was started on the Omnipod insulin pump on 09/11/16  Recent history:   Insulin regimen: HUMALOG U-200  Omnipod insulin pump settings currently: Basal rates: 12 AM = 0. 6 5, 8 AM = 1.8, and 10 PM = 1  Carbohydrate coverage 1: 20 and active insulin time 4 hours with target 100 Sensitivity 1: 15  Non-insulin hypoglycemic drugs: Victoza 1.8 mg daily   Her A1c has been consistently high, last 8.3  Fructosamine previously was mildly increased at 298 and is now 326         Current management, blood sugar patterns and problems:  She is still using the Dexcom sensor but unable to get information from her download today  For the last 2 weeks her pump download indicates that she is not bolusing at least for the last 6 days at breakfast and lunch  She was also told to bolus additional amounts  for drinking coffee in the morning  She is not entering her blood sugars in the pump unless she is taking a bolus for a meal and not likely correcting high readings usually  She appears to be periodically adjusting her basal rate during the daytime every few days and increase her basal rate on Sunday during the day but not clear what the blood sugars are better after this change  Also her BASAL rate at night at midnight is lower than on her last visit despite instructing her not to change it  She is afraid to go outside and does not do any exercise  Also recently does not enter her blood sugar at the time of bolus to enable correction at the time of her meal  Previously was concerned about the accuracy of the Dexcom and not clear how close this is  Fasting glucose in the lab was 219  No hypoglycemia documented  Side effects from medications: Candidiasis from Invokana   GLUCOSE readings:  MORNING blood sugar range 201-282 with only 3 readings Evening blood sugar range 131-381 with average blood sugar about 280  Overall average blood sugar 273   Dietician visit: Most recent: 04/2010 Weight control:  Wt Readings from Last 3 Encounters:  07/02/18 262 lb 12.8 oz (119.2 kg)  06/14/18 254 lb 3.2 oz (115.3 kg)  03/14/18 252 lb (114.3 kg)         Diabetes labs:  Lab Results  Component Value Date   HGBA1C 8.3 (  H) 06/11/2018   HGBA1C 8.8 (H) 03/11/2018   HGBA1C 9.0 (H) 11/07/2017   Lab Results  Component Value Date   MICROALBUR 0.9 06/11/2018   LDLCALC 47 08/12/2018   CREATININE 0.92 06/11/2018      No visits with results within 1 Day(s) from this visit.  Latest known visit with results is:  Lab on 08/12/2018  Component Date Value Ref Range Status  . Cholesterol 08/12/2018 115  0 - 200 mg/dL Final   ATP III Classification       Desirable:  < 200 mg/dL               Borderline High:  200 - 239 mg/dL          High:  > = 240 mg/dL  . Triglycerides 08/12/2018 96.0  0.0 -  149.0 mg/dL Final   Normal:  <150 mg/dLBorderline High:  150 - 199 mg/dL  . HDL 08/12/2018 48.10  >39.00 mg/dL Final  . VLDL 08/12/2018 19.2  0.0 - 40.0 mg/dL Final  . LDL Cholesterol 08/12/2018 47  0 - 99 mg/dL Final  . Total CHOL/HDL Ratio 08/12/2018 2   Final                  Men          Women1/2 Average Risk     3.4          3.3Average Risk          5.0          4.42X Average Risk          9.6          7.13X Average Risk          15.0          11.0                      . NonHDL 08/12/2018 66.54   Final   NOTE:  Non-HDL goal should be 30 mg/dL higher than patient's LDL goal (i.e. LDL goal of < 70 mg/dL, would have non-HDL goal of < 100 mg/dL)  . Glucose, Bld 08/12/2018 219* 70 - 99 mg/dL Final  . Fructosamine 08/12/2018 326* 0 - 285 umol/L Final   Comment: Published reference interval for apparently healthy subjects between age 69 and 62 is 51 - 285 umol/L and in a poorly controlled diabetic population is 228 - 563 umol/L with a mean of 396 umol/L.      Allergies as of 08/14/2018      Reactions   Hydrocodone Other (See Comments)   Severe anxiety Confusion   Invokana [canagliflozin] Other (See Comments)   Constant yeast infections   Imdur [isosorbide Nitrate] Other (See Comments)   Patient reported wheezing   Atorvastatin Other (See Comments)   DIZZINESS      Medication List       Accurate as of August 14, 2018  9:31 AM. Always use your most recent med list.        Aspirin Low Dose 81 MG EC tablet Generic drug:  aspirin TAKE 1 TABLET(81 MG) BY MOUTH DAILY   Dexcom G6 Receiver Devi 1 each by Does not apply route daily.   Dexcom G6 Sensor Misc APPLY SENSOR TO BODY AND CHANGE EVERY 10 DAYS   Dexcom G6 Transmitter Misc 1 each by Does not apply route daily.   Dulera 100-5 MCG/ACT Aero Generic drug:  mometasone-formoterol Inhale 2 puffs into the lungs 2 (  two) times daily as needed for wheezing or shortness of breath (allergies).   Evolocumab 140 MG/ML Soaj  Commonly known as:  Repatha SureClick Inject 616 mg into the skin every 14 (fourteen) days.   FREESTYLE TEST STRIPS test strip Generic drug:  glucose blood USE THREE TIMES DAILY TO CHECK BLOOD SUGAR   furosemide 20 MG tablet Commonly known as:  LASIX Take 1 tablet (20 mg total) by mouth daily.   insulin lispro 100 UNIT/ML injection Commonly known as:  HumaLOG INJECT 85 UNITS UNDER THE SKIN DAILY PER PUMP   irbesartan 75 MG tablet Commonly known as:  AVAPRO Take 1 tablet (75 mg total) by mouth daily.   liraglutide 18 MG/3ML Sopn Commonly known as:  Victoza ADMINISTER 1.8 MG UNDER THE SKIN EVERY DAY   metoprolol tartrate 50 MG tablet Commonly known as:  LOPRESSOR Take 1 tablet (50 mg total) by mouth 2 (two) times daily.   MULTIPLE VITAMIN PO Take 1 tablet daily by mouth.   nitroGLYCERIN 0.4 MG SL tablet Commonly known as:  NITROSTAT Place 1 tablet (0.4 mg total) under the tongue every 5 (five) minutes as needed for chest pain (up to 3 doses. If taking 3rd dose call 911).   venlafaxine XR 75 MG 24 hr capsule Commonly known as:  EFFEXOR-XR Take 75 mg by mouth daily with breakfast.   VITAMIN D-3 PO Take by mouth.       Allergies:  Allergies  Allergen Reactions  . Hydrocodone Other (See Comments)    Severe anxiety Confusion   . Invokana [Canagliflozin] Other (See Comments)    Constant yeast infections  . Imdur [Isosorbide Nitrate] Other (See Comments)    Patient reported wheezing  . Atorvastatin Other (See Comments)    DIZZINESS    Past Medical History:  Diagnosis Date  . Anemia   . Anxiety   . CAD in native artery    a. Inf STEMI 0/7371 complicated by cardiogenic shock/complete heart block s/p emergent cath showing culprit large dominant LCx s/p aspiration thrombectomy and DES, EF 50% by cath, 40-45% by echo.  . Chronic systolic heart failure ([place]) 06/25/2017   Echo 11/19: mild LVH, EF 40-45, inf-lat HK, Gr 1 DD, trivial MR  . Complete heart block,  transient ([place])    a. 06/2017 in setting of acute inf MI -> resolved after PCI.  . Diabetes mellitus   . Goiter   . History of radiation therapy 06/14/2016 - 07/19/2016   Right Breast 50 Gy 25 fractions  . Hypercholesteremia   . Hypertension   . Ischemic cardiomyopathy    a. EF 50% by cath and 40-45% by echo 06/2017.  . Malignant neoplasm of upper-outer quadrant of right female breast ([place]) 02/24/2016  . Meningioma ([place])   . Nuclear stress test    Nuclear stress test 10/19: EF 35, inferolateral, apical scar; inferior, apical inferior infarct with mild peri-infarct ischemia; high risk  . Obesity   . Personal history of chemotherapy   . Personal history of radiation therapy   . Pituitary tumor   . Seasonal asthma   . Sleep apnea    does not use every night  . Stroke [facility])    TIA - Dec 1 st, 2017    Past Surgical History:  Procedure Laterality Date  . ABDOMINAL HYSTERECTOMY    . BRAIN MENINGIOMA EXCISION  2005  . BREAST BIOPSY    . BREAST LUMPECTOMY Right    2017  . BREAST LUMPECTOMY WITH RADIOACTIVE SEED LOCALIZATION  Right 04/20/2016   Procedure: RIGHT BREAST LUMPECTOMY WITH RADIOACTIVE SEED LOCALIZATION;  Surgeon: Fanny Skates, MD;  Location: [facility];  Service: General;  Laterality: Right;  . BREAST REDUCTION SURGERY Bilateral 10/23/2016   Procedure: BILATERAL BREAST REDUCTION WITH LIPOSUCTION ASSISTANCE;  Surgeon: Cristine Polio, MD;  Location: [facility] [place];  Service: Plastics;  Laterality: Bilateral;  . CESAREAN SECTION    . CORONARY STENT INTERVENTION N/A 06/25/2017   Procedure: CORONARY STENT INTERVENTION;  Surgeon: Sherren Mocha, MD;  Location: [place] [facility];  Service: Cardiovascular;  Laterality: N/A;  . CORONARY/GRAFT ACUTE MI REVASCULARIZATION N/A 06/25/2017   Procedure: Coronary/Graft Acute MI Revascularization;  Surgeon: Sherren Mocha, MD;  Location: [place] [facility];  Service: Cardiovascular;  Laterality: N/A;  . FOOT SURGERY  Bilateral 2016   hammer toe surgery  . LEFT HEART CATH AND CORONARY ANGIOGRAPHY N/A 06/25/2017   Procedure: LEFT HEART CATH AND CORONARY ANGIOGRAPHY;  Surgeon: Sherren Mocha, MD;  Location: [place] [facility];  Service: Cardiovascular;  Laterality: N/A;  . MENISCUS REPAIR Left 2015  . PITUITARY SURGERY    . REDUCTION MAMMAPLASTY    . RIGHT HEART CATH N/A 06/25/2017   Procedure: RIGHT HEART CATH;  Surgeon: Sherren Mocha, MD;  Location: [place] [facility];  Service: Cardiovascular;  Laterality: N/A;  . THYROID SURGERY      Family History  Problem Relation Age of Onset  . Diabetes Mother   . Hyperlipidemia Mother   . Hypertension Mother     Social History:  reports that she has never smoked. She has never used smokeless tobacco. She reports current alcohol use of about 1.0 standard drinks of alcohol per week. She reports that she does not use drugs.  Review of Systems:  Hypertension:  Variable control, blood pressure is managed by cardiologist  BP Readings from Last 3 Encounters:  07/02/18 140/72  06/14/18 126/80  03/14/18 (!) 152/86     Lipids: She was able to start Repatha in 08/2017 She was not restarted on this on her last visit, reassured her that it does not cause arm pain  LDL below 70 and she is tolerating the treatment She does have a history of MI  Lab Results  Component Value Date   CHOL 115 08/12/2018   HDL 48.10 08/12/2018   LDLCALC 47 08/12/2018   TRIG 96.0 08/12/2018   CHOLHDL 2 08/12/2018   Lab Results  Component Value Date   ALT 19 06/11/2018    Diabetic shoe prescription has been given previously     Examination:   There were no vitals taken for this visit.  There is no height or weight on file to calculate BMI.     ASSESSMENT/ PLAN:    Diabetes type 2 with obesity See history of present illness for detailed discussion of current management, interpretation of her continuous glucose monitoring record, blood sugar patterns and problems  identified  Her A1c has usually been consistently high and now 8.2  She is using Humulin U-200 and the OmniPod pump  Her A1c is gradually coming down Now finally with the help of continuous glucose monitoring using the Dexcom her blood sugar patterns are better identified She clearly needs more insulin during the day between about 9 AM-4 PM Also has fairly consistently high readings through the night but mostly in the early part Blood sugars are variable in the evenings However postprandial readings are difficult to identify as she usually has high readings before her meals Also difficult  to know whether she is doing consistent boluses when she needs to, has several missing boluses around lunchtime and occasionally at dinnertime also   Recommendations today:   Change basal rates as follows Midnight = 0.9 and 8 AM = 2.0, continue 1.0 at 10 PM  Patient my chart message sent with the following recommendations:  Bolus before eating every meal and snack that has carbohydrate based on how much carbohydrate intake there is Additional 15 to 30 g to be added to the carbohydrate if eating a higher fat meal Also estimate about 20 g of carbohydrate for your morning coffee Either walk outside or walk frequently but down the steps at work for exercise Enter blood sugars in the pump every time you bolus for a meal or high sugar May bolus in between meals for high sugars if needed Make sure the Dexcom information is available for review in 1 week If having any difficulty with accuracy of the Dexcom please call the manufacturer Follow-up in 2 months and will repeat your A1c Continue Humalog U-200  HYPERTENSION: Blood pressure was controlled as of 3/20    LIPIDS: Controlled with Repatha, she has LDL target of under 70 because of hyperlipidemia with her MI history   Follow-up in 2 months  Counseling time on subjects discussed in assessment and plan sections is over 50% of today's 25 minute  visit    There are no Patient Instructions on file for this visit. Elayne Snare 08/14/2018, 9:31 AM   Counseling time on subjects discussed in assessment and plan sections is over 50% of today's 25 minute visit

## 2018-08-19 ENCOUNTER — Other Ambulatory Visit: Payer: Self-pay | Admitting: Endocrinology

## 2018-08-19 ENCOUNTER — Other Ambulatory Visit: Payer: Self-pay

## 2018-08-19 MED ORDER — LIRAGLUTIDE 18 MG/3ML ~~LOC~~ SOPN: PEN_INJECTOR | SUBCUTANEOUS | 0 refills | Status: DC

## 2018-08-26 ENCOUNTER — Other Ambulatory Visit: Payer: Self-pay | Admitting: Endocrinology

## 2018-09-04 ENCOUNTER — Emergency Department (HOSPITAL_COMMUNITY): Payer: 59

## 2018-09-04 ENCOUNTER — Other Ambulatory Visit: Payer: Self-pay

## 2018-09-04 ENCOUNTER — Observation Stay (HOSPITAL_COMMUNITY)
Admission: EM | Admit: 2018-09-04 | Discharge: 2018-09-05 | Disposition: A | Discharge status: Home / Self Care | Payer: 59 | Attending: Internal Medicine | Admitting: Internal Medicine

## 2018-09-04 ENCOUNTER — Encounter (HOSPITAL_COMMUNITY): Payer: Self-pay

## 2018-09-04 DIAGNOSIS — Z1159 Encounter for screening for other viral diseases: Secondary | ICD-10-CM | POA: Insufficient documentation

## 2018-09-04 DIAGNOSIS — I5022 Chronic systolic (congestive) heart failure: Secondary | ICD-10-CM | POA: Insufficient documentation

## 2018-09-04 DIAGNOSIS — Z794 Long term (current) use of insulin: Secondary | ICD-10-CM | POA: Insufficient documentation

## 2018-09-04 DIAGNOSIS — Z853 Personal history of malignant neoplasm of breast: Secondary | ICD-10-CM | POA: Diagnosis not present

## 2018-09-04 DIAGNOSIS — R079 Chest pain, unspecified: Secondary | ICD-10-CM | POA: Diagnosis present

## 2018-09-04 DIAGNOSIS — E1165 Type 2 diabetes mellitus with hyperglycemia: Secondary | ICD-10-CM | POA: Insufficient documentation

## 2018-09-04 DIAGNOSIS — I25118 Atherosclerotic heart disease of native coronary artery with other forms of angina pectoris: Secondary | ICD-10-CM | POA: Diagnosis not present

## 2018-09-04 DIAGNOSIS — G4733 Obstructive sleep apnea (adult) (pediatric): Secondary | ICD-10-CM

## 2018-09-04 DIAGNOSIS — I110 Hypertensive heart disease with heart failure: Secondary | ICD-10-CM | POA: Diagnosis not present

## 2018-09-04 DIAGNOSIS — IMO0001 Reserved for inherently not codable concepts without codable children: Secondary | ICD-10-CM

## 2018-09-04 DIAGNOSIS — Z9989 Dependence on other enabling machines and devices: Secondary | ICD-10-CM

## 2018-09-04 DIAGNOSIS — I10 Essential (primary) hypertension: Secondary | ICD-10-CM | POA: Diagnosis not present

## 2018-09-04 DIAGNOSIS — Z79899 Other long term (current) drug therapy: Secondary | ICD-10-CM | POA: Diagnosis not present

## 2018-09-04 DIAGNOSIS — I2510 Atherosclerotic heart disease of native coronary artery without angina pectoris: Secondary | ICD-10-CM | POA: Insufficient documentation

## 2018-09-04 DIAGNOSIS — R0789 Other chest pain: Secondary | ICD-10-CM | POA: Diagnosis not present

## 2018-09-04 DIAGNOSIS — E119 Type 2 diabetes mellitus without complications: Secondary | ICD-10-CM

## 2018-09-04 LAB — TROPONIN I
Troponin I: <0.03 ng/mL (ref <0.03)
Troponin I: <0.03 ng/mL (ref <0.03)

## 2018-09-04 LAB — SARS CORONAVIRUS 2 BY RT PCR (HOSPITAL ORDER, PERFORMED IN ~~LOC~~ HOSPITAL LAB): SARS Coronavirus 2: NEGATIVE

## 2018-09-04 LAB — CBC WITH DIFFERENTIAL/PLATELET
Abs Immature Granulocytes: 0.01 10*3/uL (ref 0.00–0.07)
Basophils Absolute: 0 10*3/uL (ref 0.0–0.1)
Basophils Relative: 1 %
Eosinophils Absolute: 0.4 10*3/uL (ref 0.0–0.5)
Eosinophils Relative: 6 %
HCT: 44.5 % (ref 36.0–46.0)
Hemoglobin: 14.1 g/dL (ref 12.0–15.0)
Immature Granulocytes: 0 %
Lymphocytes Relative: 38 %
Lymphs Abs: 2.5 10*3/uL (ref 0.7–4.0)
MCH: 29.6 pg (ref 26.0–34.0)
MCHC: 31.7 g/dL (ref 30.0–36.0)
MCV: 93.3 fL (ref 80.0–100.0)
Monocytes Absolute: 0.5 10*3/uL (ref 0.1–1.0)
Monocytes Relative: 8 %
Neutro Abs: 3.1 10*3/uL (ref 1.7–7.7)
Neutrophils Relative %: 47 %
Platelets: 330 10*3/uL (ref 150–400)
RBC: 4.77 MIL/uL (ref 3.87–5.11)
RDW: 13.3 % (ref 11.5–15.5)
WBC: 6.6 10*3/uL (ref 4.0–10.5)
nRBC: 0 % (ref 0.0–0.2)

## 2018-09-04 LAB — HEMOGLOBIN A1C
Hgb A1c MFr Bld: 8.4 % — ABNORMAL HIGH (ref 4.8–5.6)
Mean Plasma Glucose: 194.38 mg/dL

## 2018-09-04 LAB — CBC
HCT: 39.9 % (ref 36.0–46.0)
Hemoglobin: 13 g/dL (ref 12.0–15.0)
MCH: 30 pg (ref 26.0–34.0)
MCHC: 32.6 g/dL (ref 30.0–36.0)
MCV: 92.1 fL (ref 80.0–100.0)
Platelets: 303 10*3/uL (ref 150–400)
RBC: 4.33 MIL/uL (ref 3.87–5.11)
RDW: 13.4 % (ref 11.5–15.5)
WBC: 7.3 10*3/uL (ref 4.0–10.5)
nRBC: 0 % (ref 0.0–0.2)

## 2018-09-04 LAB — CREATININE, SERUM
Creatinine, Ser: 0.98 mg/dL (ref 0.44–1.00)
GFR calc Af Amer: >60 mL/min (ref >60)
GFR calc non Af Amer: >60 mL/min (ref >60)

## 2018-09-04 LAB — BASIC METABOLIC PANEL
Anion gap: 11 (ref 5–15)
BUN: 14 mg/dL (ref 6–20)
CO2: 27 mmol/L (ref 22–32)
Calcium: 9.7 mg/dL (ref 8.9–10.3)
Chloride: 103 mmol/L (ref 98–111)
Creatinine, Ser: 1.01 mg/dL — ABNORMAL HIGH (ref 0.44–1.00)
GFR calc Af Amer: >60 mL/min (ref >60)
GFR calc non Af Amer: >60 mL/min (ref >60)
Glucose, Bld: 108 mg/dL — ABNORMAL HIGH (ref 70–99)
Potassium: 3.7 mmol/L (ref 3.5–5.1)
Sodium: 141 mmol/L (ref 135–145)

## 2018-09-04 LAB — GLUCOSE, CAPILLARY: Glucose-Capillary: 101 mg/dL — ABNORMAL HIGH (ref 70–99)

## 2018-09-04 MED ORDER — METOPROLOL TARTRATE 50 MG PO TABS
50.0000 mg | ORAL_TABLET | Freq: Two times a day (BID) | ORAL | Status: DC
  Administered 2018-09-04 – 2018-09-05 (×2): 50 mg via ORAL
  Filled 2018-09-04 (×3): qty 1

## 2018-09-04 MED ORDER — IRBESARTAN 75 MG PO TABS
75.0000 mg | ORAL_TABLET | Freq: Every day | ORAL | Status: DC
  Administered 2018-09-05: 75 mg via ORAL
  Filled 2018-09-04: qty 1

## 2018-09-04 MED ORDER — FUROSEMIDE 20 MG PO TABS
20.0000 mg | ORAL_TABLET | Freq: Every day | ORAL | Status: DC
  Filled 2018-09-04: qty 1

## 2018-09-04 MED ORDER — VENLAFAXINE HCL ER 75 MG PO CP24
75.0000 mg | ORAL_CAPSULE | Freq: Every day | ORAL | Status: DC
  Administered 2018-09-05: 75 mg via ORAL
  Filled 2018-09-04: qty 1

## 2018-09-04 MED ORDER — MOMETASONE FURO-FORMOTEROL FUM 100-5 MCG/ACT IN AERO: 2.0000 | INHALATION_SPRAY | Freq: Two times a day (BID) | RESPIRATORY_TRACT | Status: DC | PRN

## 2018-09-04 MED ORDER — ACETAMINOPHEN 325 MG PO TABS: 650.0000 mg | ORAL_TABLET | ORAL | Status: DC | PRN

## 2018-09-04 MED ORDER — ENOXAPARIN SODIUM 40 MG/0.4ML ~~LOC~~ SOLN
40.0000 mg | SUBCUTANEOUS | Status: DC
  Administered 2018-09-04: 40 mg via SUBCUTANEOUS
  Filled 2018-09-04: qty 0.4

## 2018-09-04 MED ORDER — LIRAGLUTIDE 18 MG/3ML ~~LOC~~ SOPN: 1.8000 mg | PEN_INJECTOR | Freq: Every morning | SUBCUTANEOUS | Status: DC

## 2018-09-04 MED ORDER — ONDANSETRON HCL 4 MG/2ML IJ SOLN: 4.0000 mg | Freq: Four times a day (QID) | INTRAMUSCULAR | Status: DC | PRN

## 2018-09-04 MED ORDER — ASPIRIN EC 81 MG PO TBEC
81.0000 mg | DELAYED_RELEASE_TABLET | Freq: Every day | ORAL | Status: DC
  Administered 2018-09-05: 81 mg via ORAL
  Filled 2018-09-04: qty 1

## 2018-09-04 MED ORDER — NITROGLYCERIN 0.4 MG SL SUBL
0.4000 mg | SUBLINGUAL_TABLET | SUBLINGUAL | Status: DC | PRN
  Administered 2018-09-04: 0.4 mg via SUBLINGUAL
  Filled 2018-09-04: qty 1

## 2018-09-04 NOTE — H&P (Signed)
.  History and Physical    Destiny Beasley ION:[id number] DOB: 08-Aug-1955 DOA: 09/04/2018  PCP: Lawerance Cruel, MD  Patient coming from: Work/Home   Chief Complaint: Chest Pain  HPI: Destiny Beasley is a 64 y.o. female with medical history significant of DM2, CAD w/ STEMI in 2019 s/p PCI, HTN. Presents w/ chest pain starting in left axilla and radiating to left neck. Started 1330 hrs today and has been persistent for about 4 hours. She was sitting at work and not doing anything strenuous when she noticed this pain. She ignored it at first, but it worsened. Her assistant called 911. She was instructed to take 4 ASA tablets. She was transported to [facility] ED by EMS. She denies any palpitations, dyspnea, dizziness, diaphoresis, N/V, weakness. She denies any recent strenuous activity.  ED Course: She was evaluated by the ED. Initial troponin was negative. EKG with no ST changes. TRH was called for admission.   Review of Systems: Remainder of 10 point review of systems is otherwise negative for all not mentioned in HPI.    Past Medical History:  Diagnosis Date  . Anemia   . Anxiety   . CAD in native artery    a. Inf STEMI 05/4399 complicated by cardiogenic shock/complete heart block s/p emergent cath showing culprit large dominant LCx s/p aspiration thrombectomy and DES, EF 50% by cath, 40-45% by echo.  . Chronic systolic heart failure ([place]) 06/25/2017   Echo 11/19: mild LVH, EF 40-45, inf-lat HK, Gr 1 DD, trivial MR  . Complete heart block, transient ([place])    a. 06/2017 in setting of acute inf MI -> resolved after PCI.  . Diabetes mellitus   . Goiter   . History of radiation therapy 06/14/2016 - 07/19/2016   Right Breast 50 Gy 25 fractions  . Hypercholesteremia   . Hypertension   . Ischemic cardiomyopathy    a. EF 50% by cath and 40-45% by echo 06/2017.  . Malignant neoplasm of upper-outer quadrant of right female breast ([place]) 02/24/2016  . Meningioma ([place])   . Nuclear stress test    Nuclear stress test 10/19: EF 35, inferolateral, apical scar; inferior, apical inferior infarct with mild peri-infarct ischemia; high risk  . Obesity   . Personal history of chemotherapy   . Personal history of radiation therapy   . Pituitary tumor   . Seasonal asthma   . Sleep apnea    does not use every night  . Stroke [facility])    TIA - Dec 1 st, 2017    Past Surgical History:  Procedure Laterality Date  . ABDOMINAL HYSTERECTOMY    . BRAIN MENINGIOMA EXCISION  2005  . BREAST BIOPSY    . BREAST LUMPECTOMY Right    2017  . BREAST LUMPECTOMY WITH RADIOACTIVE SEED LOCALIZATION Right 04/20/2016   Procedure: RIGHT BREAST LUMPECTOMY WITH RADIOACTIVE SEED LOCALIZATION;  Surgeon: Fanny Skates, MD;  Location: [place];  Service: General;  Laterality: Right;  . BREAST REDUCTION SURGERY Bilateral 10/23/2016   Procedure: BILATERAL BREAST REDUCTION WITH LIPOSUCTION ASSISTANCE;  Surgeon: Cristine Polio, MD;  Location: [place];  Service: Plastics;  Laterality: Bilateral;  . CESAREAN SECTION    . CORONARY STENT INTERVENTION N/A 06/25/2017   Procedure: CORONARY STENT INTERVENTION;  Surgeon: Sherren Mocha, MD;  Location: [place] CV LAB;  Service: Cardiovascular;  Laterality: N/A;  . CORONARY/GRAFT ACUTE MI REVASCULARIZATION N/A 06/25/2017   Procedure: Coronary/Graft Acute MI Revascularization;  Surgeon: Sherren Mocha, MD;  Location: [place] CV LAB;  Service: Cardiovascular;  Laterality: N/A;  . FOOT SURGERY Bilateral 2016   hammer toe surgery  . LEFT HEART CATH AND CORONARY ANGIOGRAPHY N/A 06/25/2017   Procedure: LEFT HEART CATH AND CORONARY ANGIOGRAPHY;  Surgeon: Sherren Mocha, MD;  Location: [place] CV LAB;  Service: Cardiovascular;  Laterality: N/A;  . MENISCUS REPAIR Left 2015  . PITUITARY SURGERY    . REDUCTION MAMMAPLASTY    . RIGHT HEART CATH N/A 06/25/2017   Procedure: RIGHT HEART CATH;  Surgeon: Sherren Mocha, MD;  Location: [place] CV  LAB;  Service: Cardiovascular;  Laterality: N/A;  . THYROID SURGERY       reports that she has never smoked. She has never used smokeless tobacco. She reports current alcohol use of about 1.0 standard drinks of alcohol per week. She reports that she does not use drugs.  Allergies  Allergen Reactions  . Hydrocodone Other (See Comments)    Severe anxiety Confusion   . Invokana [Canagliflozin] Other (See Comments)    Constant yeast infections  . Imdur [Isosorbide Nitrate] Other (See Comments)    Patient reported wheezing  . Atorvastatin Other (See Comments)    DIZZINESS    Family History  Problem Relation Age of Onset  . Diabetes Mother   . Hyperlipidemia Mother   . Hypertension Mother     Prior to Admission medications   Medication Sig Start Date End Date Taking? Authorizing Provider  ASPIRIN LOW DOSE 81 MG EC tablet TAKE 1 TABLET(81 MG) BY MOUTH DAILY 07/22/18   Dunn, Nedra Hai, PA-C  Cholecalciferol (VITAMIN D-3 PO) Take by mouth.    [provider]  Continuous Blood Gluc Receiver ([place]) [place] 1 each by Does not apply route daily. 03/27/18   Elayne Snare, MD  Continuous Blood Gluc Sensor (DEXCOM G6 SENSOR) MISC APPLY SENSOR TO BODY AND CHANGE EVERY 10 DAYS 08/26/18   Elayne Snare, MD  Continuous Blood Gluc Transmit (DEXCOM G6 TRANSMITTER) MISC 1 each by Does not apply route daily. 03/27/18   Elayne Snare, MD  DULERA 100-5 MCG/ACT AERO Inhale 2 puffs into the lungs 2 (two) times daily as needed for wheezing or shortness of breath (allergies).  09/03/13   [provider]  Evolocumab (REPATHA SURECLICK) 914 MG/ML SOAJ Inject 140 mg into the skin every 14 (fourteen) days. 06/29/18   Elayne Snare, MD  FREESTYLE TEST STRIPS test strip USE THREE TIMES DAILY TO CHECK BLOOD SUGAR 11/22/17   Elayne Snare, MD  furosemide (LASIX) 20 MG tablet Take 1 tablet (20 mg total) by mouth daily. 03/12/18   Bhagat, Crista Luria, PA  insulin lispro (HUMALOG) 100 UNIT/ML injection INJECT 85  UNITS UNDER THE SKIN DAILY PER PUMP 04/05/18   Elayne Snare, MD  irbesartan (AVAPRO) 75 MG tablet Take 1 tablet (75 mg total) by mouth daily. 06/29/17   Dunn, Nedra Hai, PA-C  liraglutide (VICTOZA) 18 MG/3ML SOPN ADMINISTER 1.8 MG UNDER THE SKIN EVERY DAY 08/20/18   Elayne Snare, MD  metoprolol tartrate (LOPRESSOR) 50 MG tablet Take 1 tablet (50 mg total) by mouth 2 (two) times daily. 07/02/18 07/02/19  Richardson Dopp T, PA-C  MULTIPLE VITAMIN PO Take 1 tablet daily by mouth.    [provider]  nitroGLYCERIN (NITROSTAT) 0.4 MG SL tablet Place 1 tablet (0.4 mg total) under the tongue every 5 (five) minutes as needed for chest pain (up to 3 doses. If taking 3rd dose call 911). 06/29/17   Charlie Pitter, PA-C  venlafaxine XR (EFFEXOR-XR) 75 MG 24 hr capsule Take 75 mg by mouth daily with breakfast.    [provider]    Physical Exam: Vitals:   09/04/18 1510 09/04/18 1517 09/04/18 1615 09/04/18 1645  BP: (!) 163/80  (!) 154/83 (!) 154/84  Pulse: 84  80 82  Resp: 19  17 13   Temp: 97.8 F (36.6 C)     TempSrc: Oral     SpO2: 100%  99% 99%  Weight:  113.4 kg    Height:  6' (1.829 m)      Constitutional: 63 y.o. female NAD, calm, comfortable Vitals:   09/04/18 1510 09/04/18 1517 09/04/18 1615 09/04/18 1645  BP: (!) 163/80  (!) 154/83 (!) 154/84  Pulse: 84  80 82  Resp: 19  17 13   Temp: 97.8 F (36.6 C)     TempSrc: Oral     SpO2: 100%  99% 99%  Weight:  113.4 kg    Height:  6' (1.829 m)     Eyes: PERRL, lids and conjunctivae normal ENMT: Mucous membranes are moist. Posterior pharynx clear of any exudate or lesions.Normal dentition.  Neck: normal, supple, no masses, no thyromegaly Respiratory: clear to auscultation bilaterally, no wheezing, no crackles. Normal respiratory effort. No accessory muscle use.  Cardiovascular: Regular rate and rhythm, no murmurs / rubs / gallops. No extremity edema. 2+ pedal pulses. No carotid bruits.  Abdomen: no tenderness, no masses palpated. No  hepatosplenomegaly. Bowel sounds positive.  Musculoskeletal: no clubbing / cyanosis. No joint deformity upper and lower extremities. Good ROM, no contractures. Normal muscle tone.  Skin: no rashes, lesions, ulcers. No induration Neurologic: CN 2-12 grossly intact. Sensation intact, DTR normal. Strength 5/5 in all 4.  Psychiatric: Normal judgment and insight. Alert and oriented x 3. Normal mood.    Labs on Admission: I have personally reviewed following labs and imaging studies  CBC: Recent Labs  Lab 09/04/18 1529  WBC 6.6  NEUTROABS 3.1  HGB 14.1  HCT 44.5  MCV 93.3  PLT 119   Basic Metabolic Panel: Recent Labs  Lab 09/04/18 1529  NA 141  K 3.7  CL 103  CO2 27  GLUCOSE 108*  BUN 14  CREATININE 1.01*  CALCIUM 9.7   GFR: Estimated Creatinine Clearance: 83.4 mL/min (A) (by C-G formula based on SCr of 1.01 mg/dL (H)). Liver Function Tests: No results for input(s): AST, ALT, ALKPHOS, BILITOT, PROT, ALBUMIN in the last 168 hours. No results for input(s): LIPASE, AMYLASE in the last 168 hours. No results for input(s): AMMONIA in the last 168 hours. Coagulation Profile: No results for input(s): INR, PROTIME in the last 168 hours. Cardiac Enzymes: Recent Labs  Lab 09/04/18 1529  TROPONINI <0.03   BNP (last 3 results) Recent Labs    01/25/18 0932  PROBNP 281   HbA1C: No results for input(s): HGBA1C in the last 72 hours. CBG: No results for input(s): GLUCAP in the last 168 hours. Lipid Profile: No results for input(s): CHOL, HDL, LDLCALC, TRIG, CHOLHDL, LDLDIRECT in the last 72 hours. Thyroid Function Tests: No results for input(s): TSH, T4TOTAL, FREET4, T3FREE, THYROIDAB in the last 72 hours. Anemia Panel: No results for input(s): VITAMINB12, FOLATE, FERRITIN, TIBC, IRON, RETICCTPCT in the last 72 hours. Urine analysis:    Component Value Date/Time   COLORURINE YELLOW 03/24/2016 0838   APPEARANCEUR CLOUDY (A) 03/24/2016 0838   LABSPEC 1.022 03/24/2016 0838    PHURINE 5.0 03/24/2016 0838   GLUCOSEU NEGATIVE 03/24/2016 1478   GLUCOSEU  NEGATIVE 12/16/2013 0756   HGBUR TRACE (A) 03/24/2016 0838   BILIRUBINUR NEGATIVE 03/24/2016 0838   BILIRUBINUR neg 06/26/2011 1646   KETONESUR NEGATIVE 03/24/2016 0838   PROTEINUR NEGATIVE 03/24/2016 0838   UROBILINOGEN 1.0 12/16/2013 0756   NITRITE NEGATIVE 03/24/2016 0838   LEUKOCYTESUR LARGE (A) 03/24/2016 0838    Radiological Exams on Admission: Dg Chest Portable 1 View  Result Date: 09/04/2018 CLINICAL DATA:  Intermittent left-sided chest discomfort. EXAM: PORTABLE CHEST 1 VIEW COMPARISON:  Chest x-ray dated July 02, 2018. FINDINGS: The heart size and mediastinal contours are within normal limits. Both lungs are clear. The visualized skeletal structures are unremarkable. IMPRESSION: No active disease. Electronically Signed   By: Titus Dubin M.D.   On: 09/04/2018 16:09    EKG: Independently reviewed.  Assessment/Plan Active Problems:   Chest pain    Chest Pain     - admit to obs, card-tele     - left axilla radiating to left neck, not exacerbated w/ activity; persistent for 4 hours, not reproducible      - EKG w/o changes     - initial trp negative; order serial troponin and echo     - symptoms resolved after ASA     - pending echo result, consider cards consult  CAD     - stent placement March 2019     - recently had brillenta stopped; PCP stopped ASA     - continue ASA  HTN     - continue metoprolol, irbesartan, lasix  DM2     - has pump     - continue victoza     - check A1c  Admit to obs. She needs hospital observation at this time to monitor for heart status given history of STEMI and current risk factors. Expect less than 2MN stay.   DVT prophylaxis: Lovenox  Code Status: FULL  Family Communication: None at bedside  Disposition Plan: TBD   Admission status: Observation. 45 minutes spent in the coordination of this admission.    Jonnie Finner DO [facility] Pager  [phone]  If 7PM-7AM, please contact night-coverage [email or web address] Password [facility]  09/04/2018, 6:30 PM

## 2018-09-04 NOTE — ED Notes (Signed)
ED TO INPATIENT HANDOFF REPORT  ED Nurse Name and Phone #: phill [phone]  S Name/Age/Gender Destiny Beasley 63 y.o. female Room/Bed: 040C/040C  Code Status   Code Status: Prior  Home/SNF/Other Home Patient oriented to: self, place, time and situation Is this baseline? Yes   Triage Complete: Triage complete  Chief Complaint chest pain  Triage Note Ems report pt has upper left intermittent chest discomfort. Pt has history of Mi. Symptoms started around 1330. Aspirin taken before arrival of ems.   Allergies Allergies  Allergen Reactions  . Hydrocodone Other (See Comments)    Severe anxiety Confusion   . Invokana [Canagliflozin] Other (See Comments)    Constant yeast infections  . Imdur [Isosorbide Nitrate] Other (See Comments)    Patient reported wheezing  . Atorvastatin Other (See Comments)    DIZZINESS    Level of Care/Admitting Diagnosis ED Disposition    ED Disposition Condition Comment   Admit  The patient appears reasonably stabilized for admission considering the current resources, flow, and capabilities available in the ED at this time, and I doubt any other [facility] requiring further screening and/or treatment in the ED prior to admission is  present.       B Medical/Surgery History Past Medical History:  Diagnosis Date  . Anemia   . Anxiety   . CAD in native artery    a. Inf STEMI 07/9177 complicated by cardiogenic shock/complete heart block s/p emergent cath showing culprit large dominant LCx s/p aspiration thrombectomy and DES, EF 50% by cath, 40-45% by echo.  . Chronic systolic heart failure ([place]) 06/25/2017   Echo 11/19: mild LVH, EF 40-45, inf-lat HK, Gr 1 DD, trivial MR  . Complete heart block, transient ([place])    a. 06/2017 in setting of acute inf MI -> resolved after PCI.  . Diabetes mellitus   . Goiter   . History of radiation therapy 06/14/2016 - 07/19/2016   Right Breast 50 Gy 25 fractions  . Hypercholesteremia   . Hypertension   . Ischemic  cardiomyopathy    a. EF 50% by cath and 40-45% by echo 06/2017.  . Malignant neoplasm of upper-outer quadrant of right female breast ([place]) 02/24/2016  . Meningioma ([place])   . Nuclear stress test    Nuclear stress test 10/19: EF 35, inferolateral, apical scar; inferior, apical inferior infarct with mild peri-infarct ischemia; high risk  . Obesity   . Personal history of chemotherapy   . Personal history of radiation therapy   . Pituitary tumor   . Seasonal asthma   . Sleep apnea    does not use every night  . Stroke [facility])    TIA - Dec 1 st, 2017   Past Surgical History:  Procedure Laterality Date  . ABDOMINAL HYSTERECTOMY    . BRAIN MENINGIOMA EXCISION  2005  . BREAST BIOPSY    . BREAST LUMPECTOMY Right    2017  . BREAST LUMPECTOMY WITH RADIOACTIVE SEED LOCALIZATION Right 04/20/2016   Procedure: RIGHT BREAST LUMPECTOMY WITH RADIOACTIVE SEED LOCALIZATION;  Surgeon: Fanny Skates, MD;  Location: [place];  Service: General;  Laterality: Right;  . BREAST REDUCTION SURGERY Bilateral 10/23/2016   Procedure: BILATERAL BREAST REDUCTION WITH LIPOSUCTION ASSISTANCE;  Surgeon: Cristine Polio, MD;  Location: [place];  Service: Plastics;  Laterality: Bilateral;  . CESAREAN SECTION    . CORONARY STENT INTERVENTION N/A 06/25/2017   Procedure: CORONARY STENT INTERVENTION;  Surgeon: Sherren Mocha, MD;  Location: [place] [facility] LAB;  Service: Cardiovascular;  Laterality: N/A;  . CORONARY/GRAFT ACUTE MI REVASCULARIZATION N/A 06/25/2017   Procedure: Coronary/Graft Acute MI Revascularization;  Surgeon: Sherren Mocha, MD;  Location: [place] [facility] LAB;  Service: Cardiovascular;  Laterality: N/A;  . FOOT SURGERY Bilateral 2016   hammer toe surgery  . LEFT HEART CATH AND CORONARY ANGIOGRAPHY N/A 06/25/2017   Procedure: LEFT HEART CATH AND CORONARY ANGIOGRAPHY;  Surgeon: Sherren Mocha, MD;  Location: [place] [facility] LAB;  Service: Cardiovascular;  Laterality: N/A;  .  MENISCUS REPAIR Left 2015  . PITUITARY SURGERY    . REDUCTION MAMMAPLASTY    . RIGHT HEART CATH N/A 06/25/2017   Procedure: RIGHT HEART CATH;  Surgeon: Sherren Mocha, MD;  Location: [place] CV LAB;  Service: Cardiovascular;  Laterality: N/A;  . THYROID SURGERY       A IV Location/Drains/Wounds Patient Lines/Drains/Airways Status   Active Line/Drains/Airways    Name:   Placement date:   Placement time:   Site:   Days:   Peripheral IV 06/25/17 Right Antecubital   06/25/17    -    Antecubital   436   Peripheral IV 06/25/17 Left Antecubital   06/25/17    0754    Antecubital   436   Peripheral IV 09/04/18 Left Antecubital   09/04/18    1519    Antecubital   less than 1   Closed System Drain 2 Right Breast Bulb (JP) 10 Fr.   10/23/16    0819    Breast   681   Incision (Closed) 04/20/16 Breast Right   04/20/16    1355     867   Incision (Closed) 10/23/16 Breast Left   10/23/16    0818     681   Incision (Closed) 10/23/16 Breast Right   10/23/16    0818     681          Intake/Output Last 24 hours No intake or output data in the 24 hours ending 09/04/18 1807  Labs/Imaging Results for orders placed or performed during the hospital encounter of 09/04/18 (from the past 48 hour(s))  Basic metabolic panel     Status: Abnormal   Collection Time: 09/04/18  3:29 PM  Result Value Ref Range   Sodium 141 135 - 145 mmol/L   Potassium 3.7 3.5 - 5.1 mmol/L   Chloride 103 98 - 111 mmol/L   CO2 27 22 - 32 mmol/L   Glucose, Bld 108 (H) 70 - 99 mg/dL   BUN 14 6 - 20 mg/dL   Creatinine, Ser 1.01 (H) 0.44 - 1.00 mg/dL   Calcium 9.7 8.9 - 10.3 mg/dL   GFR calc non Af Amer >60 >60 mL/min   GFR calc Af Amer >60 >60 mL/min   Anion gap 11 5 - 15    Comment: Performed at [facility], [place] [address], [place], [place] [address]  CBC with Differential     Status: None   Collection Time: 09/04/18  3:29 PM  Result Value Ref Range   WBC 6.6 4.0 - 10.5 K/uL   RBC 4.77 3.87 - 5.11 MIL/uL    Hemoglobin 14.1 12.0 - 15.0 g/dL   HCT 44.5 36.0 - 46.0 %   MCV 93.3 80.0 - 100.0 fL   MCH 29.6 26.0 - 34.0 pg   MCHC 31.7 30.0 - 36.0 g/dL   RDW 13.3 11.5 - 15.5 %   Platelets 330 150 - 400 K/uL   nRBC 0.0 0.0 -  0.2 %   Neutrophils Relative % 47 %   Neutro Abs 3.1 1.7 - 7.7 K/uL   Lymphocytes Relative 38 %   Lymphs Abs 2.5 0.7 - 4.0 K/uL   Monocytes Relative 8 %   Monocytes Absolute 0.5 0.1 - 1.0 K/uL   Eosinophils Relative 6 %   Eosinophils Absolute 0.4 0.0 - 0.5 K/uL   Basophils Relative 1 %   Basophils Absolute 0.0 0.0 - 0.1 K/uL   Immature Granulocytes 0 %   Abs Immature Granulocytes 0.01 0.00 - 0.07 K/uL    Comment: Performed at [place] [address] [place], [place] [address]  Troponin I - Once     Status: None   Collection Time: 09/04/18  3:29 PM  Result Value Ref Range   Troponin I <0.03 <0.03 ng/mL    Comment: Performed at [place] [address], [place],  [address]  SARS Coronavirus 2 (CEPHEID - Performed in [facility] hospital lab), Hosp Order     Status: None   Collection Time: 09/04/18  3:55 PM  Result Value Ref Range   SARS Coronavirus 2 NEGATIVE NEGATIVE    Comment: (NOTE) If result is NEGATIVE SARS-CoV-2 target nucleic acids are NOT DETECTED. The SARS-CoV-2 RNA is generally detectable in upper and lower  respiratory specimens during the acute phase of infection. The lowest  concentration of SARS-CoV-2 viral copies this assay can detect is 250  copies / mL. A negative result does not preclude SARS-CoV-2 infection  and should not be used as the sole basis for treatment or other  patient management decisions.  A negative result may occur with  improper specimen collection / handling, submission of specimen other  than nasopharyngeal swab, presence of viral mutation(s) within the  areas targeted by this assay, and inadequate number of viral copies  (<250 copies / mL). A negative result must be combined with clinical   observations, patient history, and epidemiological information. If result is POSITIVE SARS-CoV-2 target nucleic acids are DETECTED. The SARS-CoV-2 RNA is generally detectable in upper and lower  respiratory specimens dur ing the acute phase of infection.  Positive  results are indicative of active infection with SARS-CoV-2.  Clinical  correlation with patient history and other diagnostic information is  necessary to determine patient infection status.  Positive results do  not rule out bacterial infection or co-infection with other viruses. If result is PRESUMPTIVE POSTIVE SARS-CoV-2 nucleic acids MAY BE PRESENT.   A presumptive positive result was obtained on the submitted specimen  and confirmed on repeat testing.  While 2019 novel coronavirus  (SARS-CoV-2) nucleic acids may be present in the submitted sample  additional confirmatory testing may be necessary for epidemiological  and / or clinical management purposes  to differentiate between  SARS-CoV-2 and other Sarbecovirus currently known to infect humans.  If clinically indicated additional testing with an alternate test  methodology [phone]) is advised. The SARS-CoV-2 RNA is generally  detectable in upper and lower respiratory sp ecimens during the acute  phase of infection. The expected result is Negative. Fact Sheet for Patients:  StrictlyIdeas.no Fact Sheet for Healthcare Providers: BankingDealers.co.za This test is not yet approved or cleared by the [place] FDA and has been authorized for detection and/or diagnosis of SARS-CoV-2 by FDA under an Emergency Use Authorization (EUA).  This EUA will remain in effect (meaning this test can be used) for the duration of the COVID-19 declaration under Section 564(b)(1) of the Act, 21 U.S.C.  section 360bbb-3(b)(1), unless the authorization is terminated or revoked sooner. Performed at [facility], [place] [address],  [place], [place] [address]    Dg Chest Portable 1 View  Result Date: 09/04/2018 CLINICAL DATA:  Intermittent left-sided chest discomfort. EXAM: PORTABLE CHEST 1 VIEW COMPARISON:  Chest x-ray dated July 02, 2018. FINDINGS: The heart size and mediastinal contours are within normal limits. Both lungs are clear. The visualized skeletal structures are unremarkable. IMPRESSION: No active disease. Electronically Signed   By: Titus Dubin M.D.   On: 09/04/2018 16:09    Pending Labs Unresulted Labs (From admission, onward)   None      Vitals/Pain Today's Vitals   09/04/18 1510 09/04/18 1517  BP: (!) 163/80   Pulse: 84   Resp: 19   Temp: 97.8 F (36.6 C)   TempSrc: Oral   SpO2: 100%   Weight:  113.4 kg  Height:  6' (1.829 m)  PainSc:  6     Isolation Precautions No active isolations  Medications Medications - No data to display  Mobility walks Low fall risk   Focused Assessments    R Recommendations: See Admitting Provider Note  Report given to:   Additional Notes:

## 2018-09-04 NOTE — Progress Notes (Signed)
Patient stated having 7/10 chest pain. Given 1 nitro sublingual with relief. EKG in file. Will continue to monitor patient.

## 2018-09-04 NOTE — ED Notes (Signed)
Pt's bro Orpah Clinton [phone]. Pls contact with status updates and D/C information

## 2018-09-04 NOTE — ED Triage Notes (Signed)
Ems report pt has upper left intermittent chest discomfort. Pt has history of Mi. Symptoms started around 1330. Aspirin taken before arrival of ems.

## 2018-09-04 NOTE — ED Provider Notes (Signed)
[facility] EMERGENCY DEPARTMENT Provider Note   CSN: [id number] Arrival date & time: 09/04/18  1502    History   Chief Complaint Chief Complaint  Patient presents with  . Chest Pain    HPI Destiny Beasley is a 63 y.o. female.     Patient is a 63 year old female with a history of diabetes, hypertension, hyperlipidemia and prior MI in 2694 with complication of transient complete heart block and stent placement.  She has been doing well.  She states this morning while she was at work she noticed chest pain which she describes as a achy pain in the left chest and radiates to her neck and left arm.  It is nonexertional.  Is not related to movement.  She has some mild shortness of breath but no other associated symptoms.  She says the episodes come and go and last about 15 minutes at a time.  She has no leg pain or swelling.  No pleuritic pain.  She took 4 baby aspirin prior to arrival.  She currently says her pain is eased off but it waxes and wanes in intensity.     Past Medical History:  Diagnosis Date  . Anemia   . Anxiety   . CAD in native artery    a. Inf STEMI 11/5460 complicated by cardiogenic shock/complete heart block s/p emergent cath showing culprit large dominant LCx s/p aspiration thrombectomy and DES, EF 50% by cath, 40-45% by echo.  . Chronic systolic heart failure ([place]) 06/25/2017   Echo 11/19: mild LVH, EF 40-45, inf-lat HK, Gr 1 DD, trivial MR  . Complete heart block, transient ([place])    a. 06/2017 in setting of acute inf MI -> resolved after PCI.  . Diabetes mellitus   . Goiter   . History of radiation therapy 06/14/2016 - 07/19/2016   Right Breast 50 Gy 25 fractions  . Hypercholesteremia   . Hypertension   . Ischemic cardiomyopathy    a. EF 50% by cath and 40-45% by echo 06/2017.  . Malignant neoplasm of upper-outer quadrant of right female breast ([place]) 02/24/2016  . Meningioma ([place])   . Nuclear stress test    Nuclear stress test 10/19: EF 35,  inferolateral, apical scar; inferior, apical inferior infarct with mild peri-infarct ischemia; high risk  . Obesity   . Personal history of chemotherapy   . Personal history of radiation therapy   . Pituitary tumor   . Seasonal asthma   . Sleep apnea    does not use every night  . Stroke [facility])    TIA - Dec 1 st, 2017    Patient Active Problem List   Diagnosis Date Noted  . History of TIA (transient ischemic attack) 08/08/2017  . Malignant neoplasm of breast, stage 4 ([place]) 08/08/2017  . History of complete heart block in setting of STEMI 06/2017 06/29/2017  . OSA (obstructive sleep apnea) 06/29/2017  . CAD (coronary artery disease) 06/29/2017  . Hx of Infero-Posterior ST Elevation MI in 06/2017 06/25/2017  . History of cardiogenic shock in setting of STEMI in 06/2017 06/25/2017  . Chronic systolic heart failure ([place]) 06/25/2017  . Malignant neoplasm of upper-outer quadrant of right female breast ([place]) 02/24/2016  . Type II diabetes mellitus, uncontrolled ([place]) 02/11/2014  . Essential hypertension 09/07/2013  . Chest pain 11/11/2011  . Anxiety 11/11/2011  . Diabetes mellitus ([place]) 11/11/2011  . Hypercholesterolemia 11/11/2011    Past Surgical History:  Procedure Laterality Date  . ABDOMINAL HYSTERECTOMY    .  BRAIN MENINGIOMA EXCISION  2005  . BREAST BIOPSY    . BREAST LUMPECTOMY Right    2017  . BREAST LUMPECTOMY WITH RADIOACTIVE SEED LOCALIZATION Right 04/20/2016   Procedure: RIGHT BREAST LUMPECTOMY WITH RADIOACTIVE SEED LOCALIZATION;  Surgeon: Fanny Skates, MD;  Location: [facility];  Service: General;  Laterality: Right;  . BREAST REDUCTION SURGERY Bilateral 10/23/2016   Procedure: BILATERAL BREAST REDUCTION WITH LIPOSUCTION ASSISTANCE;  Surgeon: Cristine Polio, MD;  Location: [facility];  Service: Plastics;  Laterality: Bilateral;  . CESAREAN SECTION    . CORONARY STENT INTERVENTION N/A 06/25/2017   Procedure: CORONARY STENT INTERVENTION;   Surgeon: Sherren Mocha, MD;  Location: [facility];  Service: Cardiovascular;  Laterality: N/A;  . CORONARY/GRAFT ACUTE MI REVASCULARIZATION N/A 06/25/2017   Procedure: Coronary/Graft Acute MI Revascularization;  Surgeon: Sherren Mocha, MD;  Location: [facility];  Service: Cardiovascular;  Laterality: N/A;  . FOOT SURGERY Bilateral 2016   hammer toe surgery  . LEFT HEART CATH AND CORONARY ANGIOGRAPHY N/A 06/25/2017   Procedure: LEFT HEART CATH AND CORONARY ANGIOGRAPHY;  Surgeon: Sherren Mocha, MD;  Location: [facility];  Service: Cardiovascular;  Laterality: N/A;  . MENISCUS REPAIR Left 2015  . PITUITARY SURGERY    . REDUCTION MAMMAPLASTY    . RIGHT HEART CATH N/A 06/25/2017   Procedure: RIGHT HEART CATH;  Surgeon: Sherren Mocha, MD;  Location: [facility];  Service: Cardiovascular;  Laterality: N/A;  . THYROID SURGERY       OB History    Gravida  4   Para      Term      Preterm      AB      Living  2     SAB      TAB      Ectopic      Multiple      Live Births               Home Medications    Prior to Admission medications   Medication Sig Start Date End Date Taking? Authorizing Provider  ASPIRIN LOW DOSE 81 MG EC tablet TAKE 1 TABLET(81 MG) BY MOUTH DAILY 07/22/18   Dunn, Nedra Hai, PA-C  Cholecalciferol (VITAMIN D-3 PO) Take by mouth.    [provider]  Continuous Blood Gluc Receiver ([place]) [place] 1 each by Does not apply route daily. 03/27/18   Elayne Snare, MD  Continuous Blood Gluc Sensor (DEXCOM G6 SENSOR) MISC APPLY SENSOR TO BODY AND CHANGE EVERY 10 DAYS 08/26/18   Elayne Snare, MD  Continuous Blood Gluc Transmit (DEXCOM G6 TRANSMITTER) MISC 1 each by Does not apply route daily. 03/27/18   Elayne Snare, MD  DULERA 100-5 MCG/ACT AERO Inhale 2 puffs into the lungs 2 (two) times daily as needed for wheezing or shortness of breath (allergies).  09/03/13   [provider]  Evolocumab (REPATHA SURECLICK)  683 MG/ML SOAJ Inject 140 mg into the skin every 14 (fourteen) days. 06/29/18   Elayne Snare, MD  FREESTYLE TEST STRIPS test strip USE THREE TIMES DAILY TO CHECK BLOOD SUGAR 11/22/17   Elayne Snare, MD  furosemide (LASIX) 20 MG tablet Take 1 tablet (20 mg total) by mouth daily. 03/12/18   Bhagat, Crista Luria, PA  insulin lispro (HUMALOG) 100 UNIT/ML injection INJECT 85 UNITS UNDER THE SKIN DAILY PER PUMP 04/05/18   Elayne Snare, MD  irbesartan (AVAPRO) 75 MG tablet Take 1 tablet (75 mg  total) by mouth daily. 06/29/17   Dunn, Nedra Hai, PA-C  liraglutide (VICTOZA) 18 MG/3ML SOPN ADMINISTER 1.8 MG UNDER THE SKIN EVERY DAY 08/20/18   Elayne Snare, MD  metoprolol tartrate (LOPRESSOR) 50 MG tablet Take 1 tablet (50 mg total) by mouth 2 (two) times daily. 07/02/18 07/02/19  Richardson Dopp T, PA-C  MULTIPLE VITAMIN PO Take 1 tablet daily by mouth.    [provider]  nitroGLYCERIN (NITROSTAT) 0.4 MG SL tablet Place 1 tablet (0.4 mg total) under the tongue every 5 (five) minutes as needed for chest pain (up to 3 doses. If taking 3rd dose call 911). 06/29/17   Dunn, Nedra Hai, PA-C  venlafaxine XR (EFFEXOR-XR) 75 MG 24 hr capsule Take 75 mg by mouth daily with breakfast.    [provider]    Family History Family History  Problem Relation Age of Onset  . Diabetes Mother   . Hyperlipidemia Mother   . Hypertension Mother     Social History Social History   Tobacco Use  . Smoking status: Never Smoker  . Smokeless tobacco: Never Used  Substance Use Topics  . Alcohol use: Yes    Alcohol/week: 1.0 standard drinks    Types: 1 Glasses of wine per week    Comment: 1 glass monthly  . Drug use: No     Allergies   Hydrocodone; Invokana [canagliflozin]; Imdur [isosorbide nitrate]; and Atorvastatin   Review of Systems Review of Systems  Constitutional: Negative for chills, diaphoresis, fatigue and fever.  HENT: Negative for congestion, rhinorrhea and sneezing.   Eyes: Negative.   Respiratory:  Positive for shortness of breath. Negative for cough and chest tightness.   Cardiovascular: Positive for chest pain. Negative for leg swelling.  Gastrointestinal: Negative for abdominal pain, blood in stool, diarrhea, nausea and vomiting.  Genitourinary: Negative for difficulty urinating, flank pain, frequency and hematuria.  Musculoskeletal: Negative for arthralgias and back pain.  Skin: Negative for rash.  Neurological: Negative for dizziness, speech difficulty, weakness, numbness and headaches.     Physical Exam Updated Vital Signs BP (!) 163/80 (BP Location: Right Arm)   Pulse 84   Temp 97.8 F (36.6 C) (Oral)   Resp 19   Ht 6' (1.829 m)   Wt 113.4 kg   SpO2 100%   BMI 33.91 kg/m   Physical Exam Constitutional:      Appearance: She is well-developed.  HENT:     Head: Normocephalic and atraumatic.  Eyes:     Pupils: Pupils are equal, round, and reactive to light.  Neck:     Musculoskeletal: Normal range of motion and neck supple.  Cardiovascular:     Rate and Rhythm: Normal rate and regular rhythm.     Heart sounds: Normal heart sounds.  Pulmonary:     Effort: Pulmonary effort is normal. No respiratory distress.     Breath sounds: Normal breath sounds. No wheezing or rales.  Chest:     Chest wall: No tenderness.  Abdominal:     General: Bowel sounds are normal.     Palpations: Abdomen is soft.     Tenderness: There is no abdominal tenderness. There is no guarding or rebound.  Musculoskeletal: Normal range of motion.     Comments: No edema or calf tenderness  Lymphadenopathy:     Cervical: No cervical adenopathy.  Skin:    General: Skin is warm and dry.     Findings: No rash.  Neurological:     Mental Status: She is alert  and oriented to person, place, and time.      ED Treatments / Results  Labs (all labs ordered are listed, but only abnormal results are displayed) Labs Reviewed  BASIC METABOLIC PANEL - Abnormal; Notable for the following components:       Result Value   Glucose, Bld 108 (*)    Creatinine, Ser 1.01 (*)    All other components within normal limits  SARS CORONAVIRUS 2 (HOSPITAL ORDER, [facility])  CBC WITH DIFFERENTIAL/PLATELET  TROPONIN I    EKG EKG Interpretation  Date/Time:  Wednesday Sep 04 2018 15:08:27 EDT Ventricular Rate:  85 PR Interval:    QRS Duration: 90 QT Interval:  387 QTC Calculation: 461 R Axis:   33 Text Interpretation:  Sinus rhythm Ventricular trigeminy Prolonged PR interval Inferolateral infarct, old since last tracing no significant change frequent PVCs Confirmed by Malvin Johns [phone]) on 09/04/2018 3:40:14 PM   Radiology Dg Chest Portable 1 View  Result Date: 09/04/2018 CLINICAL DATA:  Intermittent left-sided chest discomfort. EXAM: PORTABLE CHEST 1 VIEW COMPARISON:  Chest x-ray dated July 02, 2018. FINDINGS: The heart size and mediastinal contours are within normal limits. Both lungs are clear. The visualized skeletal structures are unremarkable. IMPRESSION: No active disease. Electronically Signed   By: Titus Dubin M.D.   On: 09/04/2018 16:09    Procedures Procedures (including critical care time)  Medications Ordered in ED Medications - No data to display   Initial Impression / Assessment and Plan / ED Course  I have reviewed the triage vital signs and the nursing notes.  Pertinent labs & imaging results that were available during my care of the patient were reviewed by me and considered in my medical decision making (see chart for details).        Patient is a 63 year old female who presents with chest pain.  She has had a couple of episodic episodes in the ED but currently is pain-free.  She had aspirin prior to arrival.  Her EKG does not show any ischemic changes.  Her troponin is negative.  Her chest x-ray is clear without evidence of pneumonia or pneumothorax.  I spoke with cardiology who felt that patient can be admitted by the medicine  service.  I spoke with Dr. Marylyn Ishihara with the hospitalist service who will admit the patient for further treatment.  Final Clinical Impressions(s) / ED Diagnoses   Final diagnoses:  Atypical chest pain    ED Discharge Orders    None       Malvin Johns, MD 09/04/18 1750

## 2018-09-04 NOTE — ED Notes (Signed)
Pt waiting to go [facility]. Handoff report is in, Will call after shift change.

## 2018-09-05 ENCOUNTER — Observation Stay (HOSPITAL_BASED_OUTPATIENT_CLINIC_OR_DEPARTMENT_OTHER): Payer: 59

## 2018-09-05 DIAGNOSIS — G4733 Obstructive sleep apnea (adult) (pediatric): Secondary | ICD-10-CM

## 2018-09-05 DIAGNOSIS — I2510 Atherosclerotic heart disease of native coronary artery without angina pectoris: Secondary | ICD-10-CM

## 2018-09-05 DIAGNOSIS — E119 Type 2 diabetes mellitus without complications: Secondary | ICD-10-CM | POA: Diagnosis not present

## 2018-09-05 DIAGNOSIS — Z9989 Dependence on other enabling machines and devices: Secondary | ICD-10-CM

## 2018-09-05 DIAGNOSIS — R079 Chest pain, unspecified: Secondary | ICD-10-CM | POA: Diagnosis not present

## 2018-09-05 DIAGNOSIS — R0789 Other chest pain: Secondary | ICD-10-CM | POA: Diagnosis not present

## 2018-09-05 DIAGNOSIS — Z794 Long term (current) use of insulin: Secondary | ICD-10-CM | POA: Diagnosis not present

## 2018-09-05 LAB — RENAL FUNCTION PANEL
Albumin: 3.3 g/dL — ABNORMAL LOW (ref 3.5–5.0)
Anion gap: 10 (ref 5–15)
BUN: 15 mg/dL (ref 6–20)
CO2: 27 mmol/L (ref 22–32)
Calcium: 9 mg/dL (ref 8.9–10.3)
Chloride: 101 mmol/L (ref 98–111)
Creatinine, Ser: 0.95 mg/dL (ref 0.44–1.00)
GFR calc Af Amer: >60 mL/min (ref >60)
GFR calc non Af Amer: >60 mL/min (ref >60)
Glucose, Bld: 282 mg/dL — ABNORMAL HIGH (ref 70–99)
Phosphorus: 3.6 mg/dL (ref 2.5–4.6)
Potassium: 4 mmol/L (ref 3.5–5.1)
Sodium: 138 mmol/L (ref 135–145)

## 2018-09-05 LAB — ECHOCARDIOGRAM COMPLETE
Height: 72 in
Weight: 4148.18 oz

## 2018-09-05 LAB — HIV ANTIBODY (ROUTINE TESTING W REFLEX): HIV Screen 4th Generation wRfx: NONREACTIVE

## 2018-09-05 LAB — MAGNESIUM: Magnesium: 1.8 mg/dL (ref 1.7–2.4)

## 2018-09-05 LAB — TROPONIN I: Troponin I: <0.03 ng/mL (ref <0.03)

## 2018-09-05 LAB — GLUCOSE, CAPILLARY
Glucose-Capillary: 255 mg/dL — ABNORMAL HIGH (ref 70–99)
Glucose-Capillary: 268 mg/dL — ABNORMAL HIGH (ref 70–99)

## 2018-09-05 MED ORDER — INSULIN ASPART 100 UNIT/ML ~~LOC~~ SOLN
0.0000 [IU] | SUBCUTANEOUS | Status: DC
  Administered 2018-09-05: 5 [IU] via SUBCUTANEOUS

## 2018-09-05 NOTE — Progress Notes (Signed)
*   Echocardiogram 2D Echocardiogram has been performed.  Destiny Beasley 09/05/2018, 9:08 AM

## 2018-09-05 NOTE — Consult Note (Signed)
Cardiology Consultation:   Patient ID: CHARLITA Beasley MRN: [id number]; DOB: [birth year]  Admit date: 09/04/2018 Date of Consult: 09/05/2018  Primary Care Provider: Lawerance Cruel, MD Primary Cardiologist: Sherren Mocha, MD  Primary Electrophysiologist:  None    Patient Profile:   Destiny Beasley is a 63 y.o. female with a hx of inferolateral STEMI in March 2019 w/ prox LCx stent, chronic systolic HF, HTN, HLD and T2DM, who is being seen today for the evaluation of chest pain at the request of Dr. Erlinda Hong, Internal Medicine.   History of Present Illness:   Ms. Destiny Beasley  is a 63 y.o. female, followed by Dr. Burt Knack, with coronary artery disease status post inferolateral STEMI in March 4742 complicated by cardiogenic shock, acute systolic heart failure and complete heart block. Cardiac catheterization demonstrated an occluded proximal left circumflex which was treated with a drug-eluting stent. EF was 40-45% by echocardiogram. Her other history includes type 2 diabetes, prior TIA, sleep apnea, breast cancer, asthma, hypertension and hyperlipidemia. Several months following her STEMI, she had a stress Myoview in 01/2018 for CP that demonstrated inferolateral, apical scar; inferior, apical inferior infarct with mild peri-infarct ischemia and an EF of 35%. This was reviewed with Dr. Burt Knack. It was felt that the findings represented her known disease and medical Rx could be continued. If she has progressive symptoms, she would need a Cardiac Catheterization.She had a follow-up echo in 11/19 which demonstrated stable LVF with EF at 40-45.  She was last seen by Richardson Dopp, PA-C, on 07/02/18 for her f/u 1 year post STEMI. She denied CP but endorsed chronic dyspnea, which had been present since starting Brilinta. She had no LEE, orthopnea or PND. Case was discussed w/ Dr.Cooper and decision was made to stop Brilinta and to continue mono antiplatelet therapy w/ ASA. She was advised to f/u in 6 months.    Of note: pt reports that about 1 month ago, she started having issues with very high blood pressure and her PCP instructed her to stop ASA. She has not been on any antiplatelets for over 1 month.   Up until yesterday, she had been doing well. No angina and no exertional symptoms. Has been able to walk on treadmill, 25 min at a time, w/o exertional symptoms or limitations. She was in the office yesterday and began to notice a throbbing tightness around her left shoulder and axilla, across her upper shoulder blade and up to her neck. The pain was better if she pressed on the area. The pain progressed, and she thought it was related to stress, so she tried to relax. She felt a little off/lightheaded, and given the severity of the pain she presented to ER. Once she was on the floor she received a SL NG and her metoprolol, and the pain eased off.  She notes that this wasn't a central chest pain and was not similar to her prior MI pain. Her prior pain was like an indigestion in the center of her chest. She denies any central chest discomfort at all with this event. She reports that she had pain occurring off and on from 1:30 PM until ~8:30 PM. She remains CP free this morning. Other than stopping ASA, as instructed by her PCP, she reports full compliance with all other prescription medications.   Work up thus far includes normal cardiac enzymes x 3. Echo pending. Initial EKG in the ED showed SR w/ Frequent PVCs. F/u EKG last night showed NSR and no PVCs. CXR shows  no active disease. CBC and BMP WNL. BP w/ SBP as high as the upper 160s this admission. In the 130s on last vitals check. HR in the 70s-80s.   Past Medical History:  Diagnosis Date  . Anemia   . Anxiety   . CAD in native artery    a. Inf STEMI 12/7024 complicated by cardiogenic shock/complete heart block s/p emergent cath showing culprit large dominant LCx s/p aspiration thrombectomy and DES, EF 50% by cath, 40-45% by echo.  . Chronic systolic  heart failure ([place]) 06/25/2017   Echo 11/19: mild LVH, EF 40-45, inf-lat HK, Gr 1 DD, trivial MR  . Complete heart block, transient ([place])    a. 06/2017 in setting of acute inf MI -> resolved after PCI.  . Diabetes mellitus   . Goiter   . History of radiation therapy 06/14/2016 - 07/19/2016   Right Breast 50 Gy 25 fractions  . Hypercholesteremia   . Hypertension   . Ischemic cardiomyopathy    a. EF 50% by cath and 40-45% by echo 06/2017.  . Malignant neoplasm of upper-outer quadrant of right female breast ([place]) 02/24/2016  . Meningioma ([place])   . Nuclear stress test    Nuclear stress test 10/19: EF 35, inferolateral, apical scar; inferior, apical inferior infarct with mild peri-infarct ischemia; high risk  . Obesity   . Personal history of chemotherapy   . Personal history of radiation therapy   . Pituitary tumor   . Seasonal asthma   . Sleep apnea    does not use every night  . Stroke [facility])    TIA - Dec 1 st, 2017    Past Surgical History:  Procedure Laterality Date  . ABDOMINAL HYSTERECTOMY    . BRAIN MENINGIOMA EXCISION  2005  . BREAST BIOPSY    . BREAST LUMPECTOMY Right    2017  . BREAST LUMPECTOMY WITH RADIOACTIVE SEED LOCALIZATION Right 04/20/2016   Procedure: RIGHT BREAST LUMPECTOMY WITH RADIOACTIVE SEED LOCALIZATION;  Surgeon: Fanny Skates, MD;  Location: [place];  Service: General;  Laterality: Right;  . BREAST REDUCTION SURGERY Bilateral 10/23/2016   Procedure: BILATERAL BREAST REDUCTION WITH LIPOSUCTION ASSISTANCE;  Surgeon: Cristine Polio, MD;  Location: [place];  Service: Plastics;  Laterality: Bilateral;  . CESAREAN SECTION    . CORONARY STENT INTERVENTION N/A 06/25/2017   Procedure: CORONARY STENT INTERVENTION;  Surgeon: Sherren Mocha, MD;  Location: [place] CV LAB;  Service: Cardiovascular;  Laterality: N/A;  . CORONARY/GRAFT ACUTE MI REVASCULARIZATION N/A 06/25/2017   Procedure: Coronary/Graft Acute MI Revascularization;   Surgeon: Sherren Mocha, MD;  Location: [place] CV LAB;  Service: Cardiovascular;  Laterality: N/A;  . FOOT SURGERY Bilateral 2016   hammer toe surgery  . LEFT HEART CATH AND CORONARY ANGIOGRAPHY N/A 06/25/2017   Procedure: LEFT HEART CATH AND CORONARY ANGIOGRAPHY;  Surgeon: Sherren Mocha, MD;  Location: [place] CV LAB;  Service: Cardiovascular;  Laterality: N/A;  . MENISCUS REPAIR Left 2015  . PITUITARY SURGERY    . REDUCTION MAMMAPLASTY    . RIGHT HEART CATH N/A 06/25/2017   Procedure: RIGHT HEART CATH;  Surgeon: Sherren Mocha, MD;  Location: [place] CV LAB;  Service: Cardiovascular;  Laterality: N/A;  . THYROID SURGERY       Home Medications:  Prior to Admission medications   Medication Sig Start Date End Date Taking? Authorizing Provider  ASPIRIN LOW DOSE 81 MG EC tablet TAKE 1 TABLET(81 MG) BY MOUTH DAILY Patient taking differently: Take 81  mg by mouth daily.  07/22/18  Yes Dunn, Dayna N, PA-C  Cholecalciferol (VITAMIN D-3 PO) Take 1 tablet by mouth daily.    Yes [provider]  Evolocumab (REPATHA SURECLICK) 952 MG/ML SOAJ Inject 140 mg into the skin every 14 (fourteen) days. 06/29/18  Yes Elayne Snare, MD  furosemide (LASIX) 20 MG tablet Take 1 tablet (20 mg total) by mouth daily. 03/12/18  Yes Bhagat, Bhavinkumar, PA  insulin lispro (HUMALOG) 100 UNIT/ML injection INJECT 85 UNITS UNDER THE SKIN DAILY PER PUMP Patient taking differently: Inject 85 Units into the skin daily. PER PUMP 04/05/18  Yes Elayne Snare, MD  irbesartan (AVAPRO) 75 MG tablet Take 1 tablet (75 mg total) by mouth daily. 06/29/17  Yes Dunn, Dayna N, PA-C  liraglutide (VICTOZA) 18 MG/3ML SOPN ADMINISTER 1.8 MG UNDER THE SKIN EVERY DAY Patient taking differently: Inject 1.8 mg into the skin daily.  08/20/18  Yes Elayne Snare, MD  metoprolol tartrate (LOPRESSOR) 50 MG tablet Take 1 tablet (50 mg total) by mouth 2 (two) times daily. 07/02/18 07/02/19 Yes Weaver, Scott T, PA-C  MULTIPLE VITAMIN PO Take 1  tablet daily by mouth.   Yes [provider]  nitroGLYCERIN (NITROSTAT) 0.4 MG SL tablet Place 1 tablet (0.4 mg total) under the tongue every 5 (five) minutes as needed for chest pain (up to 3 doses. If taking 3rd dose call 911). 06/29/17  Yes Dunn, Nedra Hai, PA-C  Venlafaxine HCl 150 MG TB24 Take 150 mg by mouth daily with breakfast.    Yes [provider]  Continuous Blood Gluc Receiver ([place]) [place] 1 each by Does not apply route daily. 03/27/18   Elayne Snare, MD  Continuous Blood Gluc Sensor (DEXCOM G6 SENSOR) MISC APPLY SENSOR TO BODY AND CHANGE EVERY 10 DAYS 08/26/18   Elayne Snare, MD  Continuous Blood Gluc Transmit (DEXCOM G6 TRANSMITTER) MISC 1 each by Does not apply route daily. 03/27/18   Elayne Snare, MD  FREESTYLE TEST STRIPS test strip USE THREE TIMES DAILY TO CHECK BLOOD SUGAR 11/22/17   Elayne Snare, MD    Inpatient Medications: Scheduled Meds: . aspirin EC  81 mg Oral Daily  . enoxaparin (LOVENOX) injection  40 mg Subcutaneous Q24H  . furosemide  20 mg Oral Daily  . insulin aspart  0-9 Units Subcutaneous Q4H  . irbesartan  75 mg Oral Daily  . liraglutide  1.8 mg Subcutaneous q morning - 10a  . metoprolol tartrate  50 mg Oral BID  . venlafaxine XR  75 mg Oral Q breakfast   Continuous Infusions:  PRN Meds: acetaminophen, nitroGLYCERIN, ondansetron (ZOFRAN) IV  Allergies:    Allergies  Allergen Reactions  . Hydrocodone Other (See Comments)    Severe anxiety Confusion   . Invokana [Canagliflozin] Other (See Comments)    Constant yeast infections  . Imdur [Isosorbide Nitrate] Other (See Comments)    Patient reported wheezing  . Atorvastatin Other (See Comments)    DIZZINESS    Social History:   Social History   Socioeconomic History  . Marital status: Single    Spouse name: Not on file  . Number of children: Not on file  . Years of education: Not on file  . Highest education level: Not on file  Occupational History  . Not on file   Social Needs  . Financial resource strain: Not on file  . Food insecurity:    Worry: Not on file    Inability: Not on file  . Transportation needs:  Medical: Not on file    Non-medical: Not on file  Tobacco Use  . Smoking status: Never Smoker  . Smokeless tobacco: Never Used  Substance and Sexual Activity  . Alcohol use: Yes    Alcohol/week: 1.0 standard drinks    Types: 1 Glasses of wine per week    Comment: 1 glass monthly  . Drug use: No  . Sexual activity: Not on file    Comment: no cycles; pt had hyst  Lifestyle  . Physical activity:    Days per week: Not on file    Minutes per session: Not on file  . Stress: Not on file  Relationships  . Social connections:    Talks on phone: Not on file    Gets together: Not on file    Attends religious service: Not on file    Active member of club or organization: Not on file    Attends meetings of clubs or organizations: Not on file    Relationship status: Not on file  . Intimate partner violence:    Fear of current or ex partner: Not on file    Emotionally abused: Not on file    Physically abused: Not on file    Forced sexual activity: Not on file  Other Topics Concern  . Not on file  Social History Narrative  . Not on file    Family History:    Family History  Problem Relation Age of Onset  . Diabetes Mother   . Hyperlipidemia Mother   . Hypertension Mother      ROS:  Please see the history of present illness.   All other ROS reviewed and negative.     Physical Exam/Data:   Vitals:   09/04/18 2007 09/04/18 2123 09/04/18 2130 09/05/18 0426  BP: (!) 160/96 (!) 158/80 139/77 133/70  Pulse: 84   79  Resp: 20   20  Temp: 98.1 F (36.7 C)   98 F (36.7 C)  TempSrc: Oral   Oral  SpO2: 95%   98%  Weight: 117.6 kg   117.6 kg  Height: 6' (1.829 m)       Intake/Output Summary (Last 24 hours) at 09/05/2018 1008 Last data filed at 09/04/2018 2028 Gross per 24 hour  Intake 480 ml  Output 500 ml  Net -20 ml    Last 3 Weights 09/05/2018 09/04/2018 09/04/2018  Weight (lbs) 259 lb 4.2 oz 259 lb 4.2 oz 250 lb  Weight (kg) 117.6 kg 117.6 kg 113.399 kg     Body mass index is 35.16 kg/m.  General:  Well nourished, well developed, in no acute distress HEENT: normal Lymph: no adenopathy Neck: no JVD Endocrine:  No thryomegaly Vascular: No carotid bruits; RA pulses 2+ bilaterally Cardiac:  normal S1, S2; RRR; no murmur Lungs:  clear to auscultation bilaterally, no wheezing, rhonchi or rales  Abd: soft, nontender, no hepatomegaly  Ext: no edema Musculoskeletal:  No deformities, has palpable muscle spasm along the upper aspect of her shoulder blade, with intense pain with palpation over the ridge. This is her pain from yesterday. Also noted tenderness and tightness across left pectoral and left trapezius area. Skin: warm and dry  Neuro:  CNs 2-12 intact, no focal abnormalities noted Psych:  Normal affect   EKG:  The EKG was personally reviewed and demonstrates:  Initial EKG 5/13 showed NSR w/ frequent PVCs. Repeat EKG last night showed NSR and no PVCs.  Telemetry:  Telemetry was personally reviewed and demonstrates:  NSR  with rare PVCs  Relevant CV Studies:  [facility] 06/2017  Conclusion     There is mild to moderate left ventricular systolic dysfunction.  LV end diastolic pressure is moderately elevated.  The left ventricular ejection fraction is 45-50% by visual estimate.  Prox LAD to Mid LAD lesion is 30% stenosed.  Prox Cx lesion is 100% stenosed.  A drug-eluting stent was successfully placed using a STENT SYNERGY DES 4X20.  Post intervention, there is a 0% residual stenosis.   1.  Acute inferolateral STEMI involving a large, dominant left circumflex complicated by cardiogenic shock and complete heart block 2.  Successful PCI of the left circumflex using aspiration thrombectomy and drug-eluting stent implantation 3.  Mild nonobstructive disease in the left main, LAD, and nondominant RCA  without significant stenosis in any of those vessels 4.  Mild segmental contraction abnormality of the left ventricle consistent with a left circumflex/inferior infarct, with mild segmental LV systolic dysfunction, LVEF estimated at 50% 5.  Acute systolic heart failure with elevated LVEDP and mildly elevated right heart pressures     Stress Myoview 01/2018 Study Highlights    Nuclear stress EF: 35%.  The left ventricular ejection fraction is moderately decreased (30-44%).  Blood pressure demonstrated a normal response to exercise.  There was no ST segment deviation noted during stress.  There is a medium defect of severe severity present in the basal inferolateral, mid inferolateral and apical lateral location. The defect is non-reversible and consistent with scar.  There is a medium defect of moderate severity present in the basal inferior, mid inferior and apical inferior location. The defect is partially reversible and consistent with infarct with mild peri infarct ischemia.  This is a high risk study.  Dr. York Cerise Interpretation Notes recorded by Sherren Mocha, MD on 02/14/2018 at 11:44 AM EDT Reviewed findings and correlated nuclear findings to her cardiac catheterization study. She had a big infarct with total occlusion of a dominant left circumflex. This fits with the fixed defect seen in the inferior and inferoapical segments of the LV on this nuclear study. The patient's LAD and RCA had no obstructive disease by catheterization earlier this year. Considering the atypical nature of her symptoms, even with mild peri-infarct ischemia on stress testing, I am inclined to continue with medical therapy. If her symptoms progress or become more consistent with angina, I would then consider repeat cardiac catheterization.       2D Echo 02/2018 Study Conclusions  - Left ventricle: The cavity size was normal. Wall thickness was   increased in a pattern of mild LVH. Systolic  function was mildly   to moderately reduced. The estimated ejection fraction was in the   range of 40% to 45%. Abnormal GLS at -11%, predominant inferior   strain abnormality. Inferolateral severe hypokinesis. Doppler   parameters are consistent with abnormal left ventricular   relaxation (grade 1 diastolic dysfunction). The E/e&' ratio is   between 8-15, suggesting indeterminate LV filling pressure. - Mitral valve: Mildly thickened leaflets . There was trivial   regurgitation. - Left atrium: The atrium was normal in size. - Inferior vena cava: The vessel was normal in size. The   respirophasic diameter changes were in the normal range (= 50%),   consistent with normal central venous pressure.  Impressions:  - Compared to a prior study in 06/2017, there have been no changes.  Laboratory Data:  Chemistry Recent Labs  Lab 09/04/18 1529 09/04/18 2111 09/05/18 0345  NA 141  --  138  K 3.7  --  4.0  CL 103  --  101  CO2 27  --  27  GLUCOSE 108*  --  282*  BUN 14  --  15  CREATININE 1.01* 0.98 0.95  CALCIUM 9.7  --  9.0  GFRNONAA >60 >60 >60  GFRAA >60 >60 >60  ANIONGAP 11  --  10    Recent Labs  Lab 09/05/18 0345  ALBUMIN 3.3*   Hematology Recent Labs  Lab 09/04/18 1529 09/04/18 2111  WBC 6.6 7.3  RBC 4.77 4.33  HGB 14.1 13.0  HCT 44.5 39.9  MCV 93.3 92.1  MCH 29.6 30.0  MCHC 31.7 32.6  RDW 13.3 13.4  PLT 330 303   Cardiac Enzymes Recent Labs  Lab 09/04/18 1529 09/04/18 2111 09/05/18 0345  TROPONINI <0.03 <0.03 <0.03   No results for input(s): TROPIPOC in the last 168 hours.  BNPNo results for input(s): BNP, PROBNP in the last 168 hours.  DDimer No results for input(s): DDIMER in the last 168 hours.  Radiology/Studies:  Dg Chest Portable 1 View  Result Date: 09/04/2018 CLINICAL DATA:  Intermittent left-sided chest discomfort. EXAM: PORTABLE CHEST 1 VIEW COMPARISON:  Chest x-ray dated July 02, 2018. FINDINGS: The heart size and mediastinal contours  are within normal limits. Both lungs are clear. The visualized skeletal structures are unremarkable. IMPRESSION: No active disease. Electronically Signed   By: Titus Dubin M.D.   On: 09/04/2018 16:09    Assessment and Plan:   XOLANI DEGRACIA is a 63 y.o. female with a hx of inferolateral STEMI in March 2019 w/ prox LCx stent, chronic systolic HF, HTN, HLD and T2DM, who is being seen today for the evaluation of chest pain at the request of Dr. Erlinda Hong, Internal Medicine.   1. Chest Pain w/ Known CAD: s/p inferolateral STEMI 06/2017 secondary to a totally occluded dominate LCx that was treated w/ PCI + stenting. Her LAD and RCA had no obstructive disease. EF mild-moderately reduced, 40-45%.  -pain is not like her prior MI pain -she has reproducible tenderness to palpation over her pectoral, rotator cuff, trapezius area with palpable muscle tightness -troponins negative x3 -ECG nonsichemic -echo done this AM, pending read. I personally reviewed, biplane EF appears consistent with ~40% reading.  -recommend continuing home medical therapy with  blocker, ARB and [id number].  Also recommend that she restart ASA. -spent time reviewing red flag warning signs that need immediate medical attention. Did review that women can have atypical symptoms, and with the severity of her pain yesterday it was the right call to come and be evaluated. However, with palpable muscle tenderness, discussed heating pad, stretching, etc to help with her pain today.  [facility] will sign off.   Medication Recommendations:  As above, restart aspirin Other recommendations (labs, testing, etc):  none Follow up as an outpatient:  As per previously scheduled with [facility] (due in 12/2018). If she has concerning symptoms, she will let us know and we will see her sooner. She knows to get emergency medical care for red flag symptoms, reviewed today.  For questions or updates, please contact [place] Please consult [email or web address]  for contact info under   Signed, Buford Dresser, MD  09/05/2018 10:08 AM

## 2018-09-05 NOTE — Discharge Summary (Signed)
Discharge Summary  Destiny Beasley:[id number] DOB: [birth year]  PCP: Lawerance Cruel, MD  Admit date: 09/04/2018 Discharge date: 09/05/2018  Time spent: 29mins, case discussed with cardiology Dr Harrell Gave prior to discharge  Recommendations for Outpatient Follow-up:  1. F/u with PCP within a week  for hospital discharge follow up, repeat cbc/bmp at follow up 2. F/u with cardiology 3. F/u with endocrinology for diabetes management 4. Continue all home meds, no new meds, continue insulin pump, continue cpap  Discharge Diagnoses:  Active Hospital Problems   Diagnosis Date Noted  . OSA on CPAP 06/29/2017  . Atypical chest pain 11/11/2011  . Insulin dependent diabetes mellitus ([place]) 11/11/2011    Resolved Hospital Problems  No resolved problems to display.    Discharge Condition: stable  Diet recommendation: heart healthy/carb modified  Filed Weights   09/04/18 1517 09/04/18 2007 09/05/18 0426  Weight: 113.4 kg 117.6 kg 117.6 kg    History of present illness: (per admitting MD Dr Marylyn Ishihara) PCP: Lawerance Cruel, MD  Patient coming from: Work/Home   Chief Complaint: Chest Pain  HPI: Destiny Beasley is a 63 y.o. female with medical history significant of DM2, CAD w/ STEMI in 2019 s/p PCI, HTN. Presents w/ chest pain starting in left axilla and radiating to left neck. Started 1330 hrs today and has been persistent for about 4 hours. She was sitting at work and not doing anything strenuous when she noticed this pain. She ignored it at first, but it worsened. Her assistant called 911. She was instructed to take 4 ASA tablets. She was transported to [facility] ED by EMS. She denies any palpitations, dyspnea, dizziness, diaphoresis, N/V, weakness. She denies any recent strenuous activity.  ED Course: She was evaluated by the ED. Initial troponin was negative. EKG with no ST changes. [facility] was called for admission.    Hospital Course:  Active Problems:   Atypical chest pain  Insulin dependent diabetes mellitus (HCC)   OSA on CPAP   Chest pain , likely musculoskelatal  Troponin negative, no acute EKG changes Cardiology consulted, she id cleared to discharge home  h/o CAD STEMI s/p Stent in 2019 Continue all home meds, follow up with cardiology  Chronic systolic  chf Last ef 19-50% Stable, no sign of volume overload, continue all home emds   HLD:  continue evolocumab   HTN, stable on home meds  Insulin dependent dm2, uncontrolled with hyperglycemia a1c 8.4 Continue insulin pump Continue victoza F/u with endocrinology   h/o right breast cancer in 2017, s/p chemo and XRT to right breast  OSA on cpap Body mass index is 35.16 kg/m.    Procedures:  none  Consultations:  cardiology  Discharge Exam: BP 133/70 (BP Location: Right Arm)   Pulse 79   Temp 98 F (36.7 C) (Oral)   Resp 20   Ht 6' (1.829 m)   Wt 117.6 kg   SpO2 98%   BMI 35.16 kg/m   General: NAD Cardiovascular: RRR Respiratory: CTABL  Discharge Instructions You were cared for by a hospitalist during your hospital stay. If you have any questions about your discharge medications or the care you received while you were in the hospital after you are discharged, you can call the unit and asked to speak with the hospitalist on call if the hospitalist that took care of you is not available. Once you are discharged, your primary care physician will handle any further medical issues. Please note that NO REFILLS for any discharge medications  will be authorized once you are discharged, as it is imperative that you return to your primary care physician (or establish a relationship with a primary care physician if you do not have one) for your aftercare needs so that they can reassess your need for medications and monitor your lab values.  Discharge Instructions    Diet - low sodium heart healthy   Complete by:  As directed    Carb modified diet   Increase activity slowly    Complete by:  As directed      Allergies as of 09/05/2018      Reactions   Hydrocodone Other (See Comments)   Severe anxiety Confusion   Invokana [canagliflozin] Other (See Comments)   Constant yeast infections   Imdur [isosorbide Nitrate] Other (See Comments)   Patient reported wheezing   Atorvastatin Other (See Comments)   DIZZINESS      Medication List    TAKE these medications   Aspirin Low Dose 81 MG EC tablet Generic drug:  aspirin TAKE 1 TABLET(81 MG) BY MOUTH DAILY What changed:  See the new instructions.   Dexcom G6 Receiver Devi 1 each by Does not apply route daily.   Dexcom G6 Sensor Misc APPLY SENSOR TO BODY AND CHANGE EVERY 10 DAYS   Dexcom G6 Transmitter Misc 1 each by Does not apply route daily.   Evolocumab 140 MG/ML Soaj Commonly known as:  Repatha SureClick Inject 034 mg into the skin every 14 (fourteen) days.   FREESTYLE TEST STRIPS test strip Generic drug:  glucose blood USE THREE TIMES DAILY TO CHECK BLOOD SUGAR   furosemide 20 MG tablet Commonly known as:  LASIX Take 1 tablet (20 mg total) by mouth daily.   insulin lispro 100 UNIT/ML injection Commonly known as:  HumaLOG INJECT 85 UNITS UNDER THE SKIN DAILY PER PUMP What changed:    how much to take  how to take this  when to take this  additional instructions   irbesartan 75 MG tablet Commonly known as:  AVAPRO Take 1 tablet (75 mg total) by mouth daily.   liraglutide 18 MG/3ML Sopn Commonly known as:  Victoza ADMINISTER 1.8 MG UNDER THE SKIN EVERY DAY What changed:    how much to take  how to take this  when to take this  additional instructions   metoprolol tartrate 50 MG tablet Commonly known as:  LOPRESSOR Take 1 tablet (50 mg total) by mouth 2 (two) times daily.   MULTIPLE VITAMIN PO Take 1 tablet daily by mouth.   nitroGLYCERIN 0.4 MG SL tablet Commonly known as:  NITROSTAT Place 1 tablet (0.4 mg total) under the tongue every 5 (five) minutes as needed  for chest pain (up to 3 doses. If taking 3rd dose call 911).   Venlafaxine HCl 150 MG Tb24 Take 150 mg by mouth daily with breakfast.   VITAMIN D-3 PO Take 1 tablet by mouth daily.      Allergies  Allergen Reactions  . Hydrocodone Other (See Comments)    Severe anxiety Confusion   . Invokana [Canagliflozin] Other (See Comments)    Constant yeast infections  . Imdur [Isosorbide Nitrate] Other (See Comments)    Patient reported wheezing  . Atorvastatin Other (See Comments)    DIZZINESS      The results of significant diagnostics from this hospitalization (including imaging, microbiology, ancillary and laboratory) are listed below for reference.    Significant Diagnostic Studies: US Pelvis Limited (transabdominal Only)  Result Date: 08/13/2018 CLINICAL  DATA:  Suprapubic swelling for several months EXAM: LIMITED ULTRASOUND OF PELVIS TECHNIQUE: Limited transabdominal ultrasound examination of the pelvis was performed. COMPARISON:  None. FINDINGS: The area of clinical concern there is a subcutaneous band of decreased attenuation identified which is likely related to some postoperative scarring given the history of prior panniculectomy. No focal mass or abscess is noted. Scattered small lymph nodes are noted within the inguinal regions bilaterally. No findings to suggest a hernia are seen. IMPRESSION: Changes most consistent with scarring given the patient's clinical history. No hernia is noted. Electronically Signed   By: Inez Catalina M.D.   On: 08/13/2018 07:41   Dg Chest Portable 1 View  Result Date: 09/04/2018 CLINICAL DATA:  Intermittent left-sided chest discomfort. EXAM: PORTABLE CHEST 1 VIEW COMPARISON:  Chest x-ray dated July 02, 2018. FINDINGS: The heart size and mediastinal contours are within normal limits. Both lungs are clear. The visualized skeletal structures are unremarkable. IMPRESSION: No active disease. Electronically Signed   By: Titus Dubin M.D.   On: 09/04/2018  16:09    Microbiology: Recent Results (from the past 240 hour(s))  SARS Coronavirus 2 (CEPHEID - Performed in [place] hospital lab), Hosp Order     Status: None   Collection Time: 09/04/18  3:55 PM  Result Value Ref Range Status   SARS Coronavirus 2 NEGATIVE NEGATIVE Final    Comment: (NOTE) If result is NEGATIVE SARS-CoV-2 target nucleic acids are NOT DETECTED. The SARS-CoV-2 RNA is generally detectable in upper and lower  respiratory specimens during the acute phase of infection. The lowest  concentration of SARS-CoV-2 viral copies this assay can detect is 250  copies / mL. A negative result does not preclude SARS-CoV-2 infection  and should not be used as the sole basis for treatment or other  patient management decisions.  A negative result may occur with  improper specimen collection / handling, submission of specimen other  than nasopharyngeal swab, presence of viral mutation(s) within the  areas targeted by this assay, and inadequate number of viral copies  (<250 copies / mL). A negative result must be combined with clinical  observations, patient history, and epidemiological information. If result is POSITIVE SARS-CoV-2 target nucleic acids are DETECTED. The SARS-CoV-2 RNA is generally detectable in upper and lower  respiratory specimens dur ing the acute phase of infection.  Positive  results are indicative of active infection with SARS-CoV-2.  Clinical  correlation with patient history and other diagnostic information is  necessary to determine patient infection status.  Positive results do  not rule out bacterial infection or co-infection with other viruses. If result is PRESUMPTIVE POSTIVE SARS-CoV-2 nucleic acids MAY BE PRESENT.   A presumptive positive result was obtained on the submitted specimen  and confirmed on repeat testing.  While 2019 novel coronavirus  (SARS-CoV-2) nucleic acids may be present in the submitted sample  additional confirmatory testing may  be necessary for epidemiological  and / or clinical management purposes  to differentiate between  SARS-CoV-2 and other Sarbecovirus currently known to infect humans.  If clinically indicated additional testing with an alternate test  methodology [phone]) is advised. The SARS-CoV-2 RNA is generally  detectable in upper and lower respiratory sp ecimens during the acute  phase of infection. The expected result is Negative. Fact Sheet for Patients:  StrictlyIdeas.no Fact Sheet for Healthcare Providers: BankingDealers.co.za This test is not yet approved or cleared by the [place] FDA and has been authorized for detection and/or diagnosis of SARS-CoV-2 by FDA  under an Emergency Use Authorization (EUA).  This EUA will remain in effect (meaning this test can be used) for the duration of the COVID-19 declaration under Section 564(b)(1) of the Act, 21 U.S.C. section 360bbb-3(b)(1), unless the authorization is terminated or revoked sooner. Performed at [facility], [place] [address], [place], [place] [address]      Labs: Basic Metabolic Panel: Recent Labs  Lab 09/04/18 1529 09/04/18 2111 09/05/18 0345  NA 141  --  138  K 3.7  --  4.0  CL 103  --  101  CO2 27  --  27  GLUCOSE 108*  --  282*  BUN 14  --  15  CREATININE 1.01* 0.98 0.95  CALCIUM 9.7  --  9.0  MG  --   --  1.8  PHOS  --   --  3.6   Liver Function Tests: Recent Labs  Lab 09/05/18 0345  ALBUMIN 3.3*   No results for input(s): LIPASE, AMYLASE in the last 168 hours. No results for input(s): AMMONIA in the last 168 hours. CBC: Recent Labs  Lab 09/04/18 1529 09/04/18 2111  WBC 6.6 7.3  NEUTROABS 3.1  --   HGB 14.1 13.0  HCT 44.5 39.9  MCV 93.3 92.1  PLT 330 303   Cardiac Enzymes: Recent Labs  Lab 09/04/18 1529 09/04/18 2111 09/05/18 0345  TROPONINI <0.03 <0.03 <0.03   BNP: BNP (last 3 results) No results for input(s): BNP in the last 8760  hours.  ProBNP (last 3 results) Recent Labs    01/25/18 0932  PROBNP 281    CBG: Recent Labs  Lab 09/04/18 2011 09/05/18 0757 09/05/18 1215  GLUCAP 101* 255* 268*       Signed:  Florencia Reasons MD, PhD  [facility] 09/05/2018, 1:05 PM

## 2018-09-17 ENCOUNTER — Other Ambulatory Visit: Payer: Self-pay | Admitting: Endocrinology

## 2018-09-18 ENCOUNTER — Telehealth: Payer: Self-pay | Admitting: Endocrinology

## 2018-09-18 ENCOUNTER — Other Ambulatory Visit: Payer: Self-pay

## 2018-09-18 NOTE — Telephone Encounter (Signed)
Noted  

## 2018-09-18 NOTE — Telephone Encounter (Signed)
Patients pharmacy called in regards to needing a prior auth for patients Evolocumab (REPATHA SURECLICK) 947 MG/ML SOAJ.

## 2018-09-27 ENCOUNTER — Telehealth: Payer: Self-pay | Admitting: Endocrinology

## 2018-09-27 NOTE — Telephone Encounter (Signed)
Copied from [place] [phone]. Topic: Quick Communication - Rx Refill/Question >> Sep 27, 2018  3:50 PM Margot Ables wrote: Continuous Blood Gluc Transmit (DEXCOM G6 TRANSMITTER) - pt has insurance change which is requiring PA Evolocumab (REPATHA SURECLICK) 741 MG/ML SOAJ - pt has insurance change which is requiring PA on medication - pt is out of medication - she usually takes on Wednesdays  [place], [place] [phone] (Phone) [phone] (Fax)

## 2018-09-30 NOTE — Telephone Encounter (Signed)
Do you want to do PA for the Dexcom or change?

## 2018-09-30 NOTE — Telephone Encounter (Signed)
She needs to check with her insurance her freestyle Elenor Legato is covered without PA and this can be used. She can have a PA done for Repatha, has history of diabetes and coronary artery disease

## 2018-10-01 ENCOUNTER — Telehealth: Payer: Self-pay | Admitting: Endocrinology

## 2018-10-01 NOTE — Telephone Encounter (Signed)
Patient called and advised that it is ok to complete PA for [employer], she has confirmed that insurance will cover with PA

## 2018-10-02 NOTE — Telephone Encounter (Signed)
PA completed for Repatha by calling [facility] PA line at [phone]. PA reference number is [id number] and spoke with [place].

## 2018-10-04 NOTE — Telephone Encounter (Signed)
Fax received from [facility] stating that the patient is not approved for Repatha Sureclick because she has not been one high intensity statin therapy for the last 12 weeks. -NOTE: It was noted on the PA that patient has Atorvastatin allergy listed  On her chart.  How would you like to proceed?

## 2018-10-04 NOTE — Telephone Encounter (Signed)
We need to state that she is statin intolerant with side effects from atorvastatin and rosuvastatin and she has been on Repatha for the last 12 weeks

## 2018-10-08 NOTE — Telephone Encounter (Signed)
New PA initiated through [facility], citing that the pt is allergic to statin medications. Chart notes faxed to [phone]. PA is now pending pharmacist review. PA case ID #: [id number]

## 2018-10-10 ENCOUNTER — Telehealth: Payer: Self-pay | Admitting: Endocrinology

## 2018-10-10 NOTE — Telephone Encounter (Signed)
Paperwork was received. Will be filled out and sent back. Turn around time for paperwork is 7-10 business days.

## 2018-10-10 NOTE — Telephone Encounter (Signed)
[facility] called to confirm we received there paperwork work for patients medication.  Please Advise,  Thanks

## 2018-10-14 ENCOUNTER — Telehealth: Payer: Self-pay | Admitting: Endocrinology

## 2018-10-14 ENCOUNTER — Other Ambulatory Visit: Payer: 59

## 2018-10-14 NOTE — Telephone Encounter (Signed)
Repatha is not apparently covered.  She will need to work with her cardiologist to get this or another drug

## 2018-10-14 NOTE — Telephone Encounter (Signed)
MyChart message sent to pt with this information.

## 2018-10-14 NOTE — Telephone Encounter (Signed)
Patient called re: status of the PA for Repatha. Please call patient at ph# [phone].

## 2018-10-14 NOTE — Telephone Encounter (Signed)
PA was denied again even though it was noted on the PA that the pt has an intolerance to statin drugs.

## 2018-10-15 ENCOUNTER — Other Ambulatory Visit: Payer: Self-pay | Admitting: Endocrinology

## 2018-10-16 ENCOUNTER — Telehealth: Payer: Self-pay

## 2018-10-16 NOTE — Telephone Encounter (Signed)
LOV 08/14/18. Per Dr. Dwyane Dee, labs and f/u in 2 mo. Also received request to refill Freestyle Test Strips. Unable to refill without a current appt. Called pt to schedule appt. LVM requesting returned call.

## 2018-10-17 ENCOUNTER — Ambulatory Visit: Payer: 59 | Admitting: Endocrinology

## 2018-10-22 ENCOUNTER — Telehealth: Payer: Self-pay | Admitting: Pharmacist

## 2018-10-22 NOTE — Telephone Encounter (Signed)
Spoke with patient to clarify her statin intolerance. Patient states that she has had muscle aches and dizziness to all statins she has tried including simvastatin 20mg  daily, rosuvastatin 20mg , rosuvastatin 5mg  daily and atorvastatin 80mg  daily.

## 2018-10-24 ENCOUNTER — Telehealth: Payer: Self-pay

## 2018-10-24 NOTE — Telephone Encounter (Signed)
PA initiated via [email or web address] for Victoza 1.8mg  daily. (KeyMarylou Mccoy) Rx #: [id number] Victoza 18MG Fayne Mediate pen-injectors   Form [facility] Electronic Prior Authorization Form [phone] [place])

## 2018-10-24 NOTE — Telephone Encounter (Signed)
Approval is good through 10/24/2019.

## 2018-10-24 NOTE — Telephone Encounter (Signed)
PA has been approved

## 2018-10-30 ENCOUNTER — Telehealth: Payer: Self-pay | Admitting: Pharmacist

## 2018-10-30 NOTE — Telephone Encounter (Signed)
Pt picked up 2 Repatha samples this AM while we wait for appeals determination.

## 2018-11-12 ENCOUNTER — Other Ambulatory Visit: Payer: Self-pay | Admitting: Endocrinology

## 2018-11-18 ENCOUNTER — Other Ambulatory Visit: Payer: Self-pay | Admitting: Endocrinology

## 2018-12-02 ENCOUNTER — Telehealth: Payer: Self-pay | Admitting: Endocrinology

## 2018-12-02 NOTE — Telephone Encounter (Signed)
[facility] is called in regards to faxing [place] on 11/28/18 for patients reorder of OmniPods.  Ph # [phone] option 2 Fax # [phone]

## 2018-12-03 NOTE — Telephone Encounter (Signed)
This cannot be sent until pt has been seen.

## 2018-12-04 NOTE — Telephone Encounter (Signed)
[facility] called in regards to paperwork, informed of info below.

## 2018-12-05 ENCOUNTER — Other Ambulatory Visit: Payer: 59

## 2018-12-09 ENCOUNTER — Ambulatory Visit: Payer: 59 | Admitting: Endocrinology

## 2018-12-13 ENCOUNTER — Telehealth: Payer: Self-pay | Admitting: Pharmacist

## 2018-12-13 NOTE — Telephone Encounter (Signed)
Called patient to make sure she was aware that appeals for Repatha was overturned and now approved through 11/21/2019. Patient appreciative of the call.

## 2018-12-30 ENCOUNTER — Other Ambulatory Visit: Payer: Self-pay | Admitting: Endocrinology

## 2018-12-31 ENCOUNTER — Telehealth: Payer: Self-pay | Admitting: Endocrinology

## 2018-12-31 NOTE — Telephone Encounter (Signed)
Called patient, attempted to set appointment for lab and Dr Dwyane Dee f/u - while speaking with her patient stated was having an emergency and would have to cal [place] back, hung up

## 2018-12-31 NOTE — Progress Notes (Signed)
Cardiology Office Note:    Date:  01/01/2019   ID:  DAVYN SCHWALLER, DOB [birth year], MRN [id number]  PCP:  Lawerance Cruel, MD  Cardiologist:  Sherren Mocha, MD  Electrophysiologist:  None   Referring MD: Lawerance Cruel, MD   Chief Complaint  Patient presents with  . Follow-up    CAD, CHF, HTN    History of Present Illness:    Destiny Beasley is a 63 y.o. female with:  Coronary artery disease   S/p inf-lat STEMI in 06/2017 c/b CGS, acute HFrEF, CHB >> PCI:  DES to pLCx  Myoview 10/19: inferior scar, mild peri-infarct ischemia  Chronic Systolic CHF  Ischemic CM  Echocardiogram 3/19: EF 40-45  Echocardiogram 11/19: EF 40-45  Echocardiogram 08/2018: EF 40-45  Diabetes mellitus   Prior TIA  OSA  Breast CA   Hypertension   Hyperlipidemia    Ms. Willmore was last seen in March 2020.  Since then, she was admitted in May 2020 with chest pain.  Troponin levels remained normal.  An echocardiogram demonstrated stable LVF with EF 40-45.  She returns for follow up.  She is here alone.  She has been feeling well.  She has not had chest pain, shortness of breath, syncope, leg swelling or orthopnea.  She has been exercising on a regular basis.  She has noticed that her blood pressure is continuing to remain above target.  Prior CV studies:   The following studies were reviewed today:  Echocardiogram 09/05/2018 EF 40-45, inf-lat AK, mild LVH, Gr 1 DD, normal RVSF  Echo 03/07/18 Mild LVH, EF 40-45, GLS -11%, inf-lat HK, Gr 1 DD, trivial MR  Nuclear stress test10/22/19 EF 35, inferolateral, apical scar; inferior, apical inferior infarct with mild peri-infarct ischemia; high risk  Echo 06/26/17 EF 40-45, inferolateral hypokinesis, mild MR  Cardiac catheterization 06/25/17 LM luminal irregularities LAD luminal irregularities, proximal 30 LCx proximal 100 RCA irregularities EF 45-50, inferolateral akinesis PCI: 4 x 20 mm Synergy DES to the proximal LCx  Echo  03/25/16 Moderate LVH, EF 60-65, normal wall motion  Carotid US 12/17 Bilateral ICA 1-39  Nuclear stress test 7/13 1. Negative for pharmacologic-stress induced ischemia. 2. Left ventricular ejection fraction 65%.   Past Medical History:  Diagnosis Date  . Anemia   . Anxiety   . CAD in native artery    a. Inf STEMI 123XX123 complicated by cardiogenic shock/complete heart block s/p emergent cath showing culprit large dominant LCx s/p aspiration thrombectomy and DES, EF 50% by cath, 40-45% by echo.  . Chronic systolic heart failure ([place]) 06/25/2017   Echo 11/19: mild LVH, EF 40-45, inf-lat HK, Gr 1 DD, trivial MR  . Complete heart block, transient ([place])    a. 06/2017 in setting of acute inf MI -> resolved after PCI.  . Diabetes mellitus   . Goiter   . History of radiation therapy 06/14/2016 - 07/19/2016   Right Breast 50 Gy 25 fractions  . Hypercholesteremia   . Hypertension   . Ischemic cardiomyopathy    a. EF 50% by cath and 40-45% by echo 06/2017.  . Malignant neoplasm of upper-outer quadrant of right female breast ([place]) 02/24/2016  . Meningioma ([place])   . Nuclear stress test    Nuclear stress test 10/19: EF 35, inferolateral, apical scar; inferior, apical inferior infarct with mild peri-infarct ischemia; high risk  . Obesity   . Personal history of chemotherapy   . Personal history of radiation therapy   . Pituitary tumor   .  Seasonal asthma   . Sleep apnea    does not use every night  . Stroke [facility])    TIA - Dec 1 st, 2017   Surgical Hx: The patient  has a past surgical history that includes Cesarean section; Abdominal hysterectomy; Thyroid surgery; Pituitary surgery; Brain meningioma excision (2005); Meniscus repair (Left, 2015); Foot surgery (Bilateral, 2016); Breast lumpectomy with radioactive seed localization (Right, 04/20/2016); Breast reduction surgery (Bilateral, 10/23/2016); Breast lumpectomy (Right); Breast biopsy; Reduction mammaplasty; LEFT HEART CATH AND CORONARY  ANGIOGRAPHY (N/A, 06/25/2017); RIGHT HEART CATH (N/A, 06/25/2017); Coronary/Graft Acute MI Revascularization (N/A, 06/25/2017); and CORONARY STENT INTERVENTION (N/A, 06/25/2017).   Current Medications: Current Meds  Medication Sig  . Cholecalciferol (VITAMIN D-3 PO) Take 1 tablet by mouth daily.   . Continuous Blood Gluc Transmit (DEXCOM G6 TRANSMITTER) MISC 1 each by Does not apply route daily.  . Evolocumab (REPATHA SURECLICK) XX123456 MG/ML SOAJ Inject 140 mg into the skin every 14 (fourteen) days.  Marland Kitchen FREESTYLE TEST STRIPS test strip USE THREE TIMES DAILY TO CHECK BLOOD SUGAR  . furosemide (LASIX) 20 MG tablet Take 1 tablet (20 mg total) by mouth daily.  Marland Kitchen HUMALOG KWIKPEN 200 UNIT/ML SOPN INJECT 80 UNITS INTO THE PUMP ONCE DAILY  . irbesartan (AVAPRO) 75 MG tablet Take 1 tablet (75 mg total) by mouth daily.  Marland Kitchen liraglutide (VICTOZA) 18 MG/3ML SOPN Inject 0.3 mLs (1.8 mg total) into the skin daily.  . MULTIPLE VITAMIN PO Take 1 tablet daily by mouth.  . pravastatin (PRAVACHOL) 20 MG tablet Take 1 tablet by mouth once a week. On Friday  . Venlafaxine HCl 150 MG TB24 Take 150 mg by mouth daily with breakfast.   . [DISCONTINUED] ASPIRIN LOW DOSE 81 MG EC tablet TAKE 1 TABLET(81 MG) BY MOUTH DAILY (Patient taking differently: Take 81 mg by mouth daily. )  . [DISCONTINUED] Continuous Blood Gluc Receiver ([place]) DEVI 1 each by Does not apply route daily.  . [DISCONTINUED] Continuous Blood Gluc Sensor (DEXCOM G6 SENSOR) MISC APPLY SENSOR TO BODY AND CHANGE EVERY 10 DAYS  . [DISCONTINUED] insulin lispro (HUMALOG) 100 UNIT/ML injection INJECT 85 UNITS UNDER THE SKIN DAILY PER PUMP (Patient taking differently: Inject 85 Units into the skin daily. PER PUMP)  . [DISCONTINUED] metoprolol tartrate (LOPRESSOR) 50 MG tablet Take 1 tablet (50 mg total) by mouth 2 (two) times daily.  . [DISCONTINUED] nitroGLYCERIN (NITROSTAT) 0.4 MG SL tablet Place 1 tablet (0.4 mg total) under the tongue every 5 (five) minutes  as needed for chest pain (up to 3 doses. If taking 3rd dose call 911).     Allergies:   Hydrocodone, Invokana [canagliflozin], Imdur [isosorbide nitrate], and Atorvastatin   Social History   Tobacco Use  . Smoking status: Never Smoker  . Smokeless tobacco: Never Used  Substance Use Topics  . Alcohol use: Yes    Alcohol/week: 1.0 standard drinks    Types: 1 Glasses of wine per week    Comment: 1 glass monthly  . Drug use: No     Family Hx: The patient's family history includes Diabetes in her mother; Hyperlipidemia in her mother; Hypertension in her mother.  ROS:   Please see the history of present illness.    Review of Systems  Gastrointestinal: Negative for hematochezia and melena.  Genitourinary: Negative for hematuria.   All other systems reviewed and are negative.   EKGs/Labs/Other Test Reviewed:    EKG:  EKG is not ordered today.  The ekg ordered today demonstrates N/A  Recent Labs: 01/25/2018: NT-Pro BNP 281 06/11/2018: ALT 19 09/04/2018: Hemoglobin 13.0; Platelets 303 09/05/2018: BUN 15; Creatinine, Ser 0.95; Magnesium 1.8; Potassium 4.0; Sodium 138   Recent Lipid Panel Lab Results  Component Value Date/Time   CHOL 115 08/12/2018 09:44 AM   TRIG 96.0 08/12/2018 09:44 AM   HDL 48.10 08/12/2018 09:44 AM   CHOLHDL 2 08/12/2018 09:44 AM   LDLCALC 47 08/12/2018 09:44 AM    Physical Exam:    VS:  BP (!) 140/96   Pulse 81   Ht 6' (1.829 m)   Wt 259 lb (117.5 kg)   SpO2 99%   BMI 35.13 kg/m     Wt Readings from Last 3 Encounters:  01/01/19 259 lb (117.5 kg)  09/05/18 259 lb 4.2 oz (117.6 kg)  07/02/18 262 lb 12.8 oz (119.2 kg)     Physical Exam  Constitutional: She is oriented to person, place, and time. She appears well-developed and well-nourished. No distress.  HENT:  Head: Normocephalic and atraumatic.  Eyes: No scleral icterus.  Neck: No JVD present. No thyromegaly present.  Cardiovascular: Normal rate, regular rhythm and normal heart sounds.   No murmur heard. Pulmonary/Chest: Effort normal and breath sounds normal. She has no rales.  Abdominal: Soft. There is no hepatomegaly.  Musculoskeletal:        General: No edema.  Lymphadenopathy:    She has no cervical adenopathy.  Neurological: She is alert and oriented to person, place, and time.  Skin: Skin is warm and dry.  Psychiatric: She has a normal mood and affect.    ASSESSMENT & PLAN:    1. Coronary artery disease involving native coronary artery of native heart with angina pectoris [facility]) History of posterior MI in March [id number] complicated by cardiogenic shock, complete heart block and systolic heart failure.  She was treated with a drug-eluting stent to the LCx.  She completed 1 year of dual antiplatelet therapy.  Myoview in October 2019 demonstrated significant scar but no significant ischemia.  She is currently doing well without anginal symptoms.  She was recently taken off of aspirin for unclear reasons.  Resume aspirin 81 mg daily.  We discussed the importance of indefinite aspirin therapy for secondary risk reduction.  Continue evolocumab, irbesartan, beta-blocker.  Follow-up in 6 months.  2. Chronic systolic heart failure ([place]) EF 40-45 by echo 08/2018.  Continue beta-blocker, ARB.  Change metoprolol to carvedilol as outlined below for blood pressure control.  3. Type 2 diabetes mellitus without complication, with long-term current use of insulin (HCC) Recent A1c 8.5.  Managed by primary care.  Empagliflozin could be considered given the results of the EMPA-REG OUTCOME trial.  4. Essential hypertension Blood pressure uncontrolled.  Continue irbesartan.  DC metoprolol.  Start carvedilol 12.5 mg twice daily.  I have asked her to call us with blood pressure readings in about 2 to 4 weeks.  5. Hypercholesterolemia LDL optimal on most recent lab work.  Continue current Rx.     Dispo:  Return in about 6 months (around 07/01/2019) for Routine Follow Up, w/ Dr. Burt Knack, or Richardson Dopp, PA-C, (virtual or in-person).   Medication Adjustments/Labs and Tests Ordered: Current medicines are reviewed at length with the patient today.  Concerns regarding medicines are outlined above.  Tests Ordered: No orders of the defined types were placed in this encounter.  Medication Changes: Meds ordered this encounter  Medications  . aspirin EC 81 MG tablet    Sig: Take 1 tablet (81 mg total)  by mouth daily.    Dispense:  90 tablet    Refill:  3  . carvedilol (COREG) 12.5 MG tablet    Sig: Take 1 tablet (12.5 mg total) by mouth 2 (two) times daily.    Dispense:  180 tablet    Refill:  3    Signed, Richardson Dopp, PA-C  01/01/2019 9:28 AM    [facility] [address], [place], [place]  [address] Phone: [phone]; Fax: [phone]

## 2019-01-01 ENCOUNTER — Other Ambulatory Visit: Payer: Self-pay

## 2019-01-01 ENCOUNTER — Encounter: Payer: Self-pay | Admitting: Physician Assistant

## 2019-01-01 ENCOUNTER — Ambulatory Visit (INDEPENDENT_AMBULATORY_CARE_PROVIDER_SITE_OTHER): Payer: 59 | Admitting: Physician Assistant

## 2019-01-01 VITALS — BP 140/96 | HR 81 | Ht 72.0 in | Wt 259.0 lb

## 2019-01-01 DIAGNOSIS — I5022 Chronic systolic (congestive) heart failure: Secondary | ICD-10-CM

## 2019-01-01 DIAGNOSIS — E119 Type 2 diabetes mellitus without complications: Secondary | ICD-10-CM

## 2019-01-01 DIAGNOSIS — I25119 Atherosclerotic heart disease of native coronary artery with unspecified angina pectoris: Secondary | ICD-10-CM | POA: Diagnosis not present

## 2019-01-01 DIAGNOSIS — I10 Essential (primary) hypertension: Secondary | ICD-10-CM

## 2019-01-01 DIAGNOSIS — E7800 Pure hypercholesterolemia, unspecified: Secondary | ICD-10-CM

## 2019-01-01 DIAGNOSIS — Z794 Long term (current) use of insulin: Secondary | ICD-10-CM

## 2019-01-01 MED ORDER — CARVEDILOL 12.5 MG PO TABS: 12.5000 mg | ORAL_TABLET | Freq: Two times a day (BID) | ORAL | 3 refills | Status: DC

## 2019-01-01 MED ORDER — ASPIRIN EC 81 MG PO TBEC: 81.0000 mg | DELAYED_RELEASE_TABLET | Freq: Every day | ORAL | 3 refills | Status: DC

## 2019-01-01 NOTE — Patient Instructions (Addendum)
Medication Instructions:  Your physician has recommended you make the following change in your medication:  1. Discontinue Metoprolol 2. Start Asprin (81 mg) daily. 3. Start Coreg one tablet (12.5 mg ) twice daily. 4.  Medication sent in today to requested pharmacy.   Labwork: -None   Testing/Procedures: -None  Follow-Up: Your physician recommends that you keep your scheduled  follow-up appointment with Richardson Dopp, PA-C on March 17 @ 8:15 am.    Any Other Special Instructions Will Be Listed Below (If Applicable).  Check BP readings for 2-4 weeks, Goal is < 130/80.  Please send readings in either by mchart or you can drop off at office in the lobby please put Scott's name on readings if you drop off in office.   If you need a refill on your cardiac medications before your next appointment, please call your pharmacy.

## 2019-01-13 ENCOUNTER — Other Ambulatory Visit: Payer: Self-pay | Admitting: Cardiology

## 2019-01-13 DIAGNOSIS — Z20822 Contact with and (suspected) exposure to covid-19: Secondary | ICD-10-CM

## 2019-01-14 LAB — NOVEL CORONAVIRUS, NAA: SARS-CoV-2, NAA: NOT DETECTED

## 2019-01-17 ENCOUNTER — Other Ambulatory Visit: Payer: Self-pay

## 2019-01-17 ENCOUNTER — Other Ambulatory Visit (INDEPENDENT_AMBULATORY_CARE_PROVIDER_SITE_OTHER): Payer: 59

## 2019-01-17 DIAGNOSIS — Z794 Long term (current) use of insulin: Secondary | ICD-10-CM

## 2019-01-17 DIAGNOSIS — E1165 Type 2 diabetes mellitus with hyperglycemia: Secondary | ICD-10-CM

## 2019-01-17 LAB — BASIC METABOLIC PANEL
BUN: 16 mg/dL (ref 6–23)
CO2: 30 mEq/L (ref 19–32)
Calcium: 9.5 mg/dL (ref 8.4–10.5)
Chloride: 101 mEq/L (ref 96–112)
Creatinine, Ser: 0.89 mg/dL (ref 0.40–1.20)
GFR: 78.03 mL/min (ref >60.00)
Glucose, Bld: 250 mg/dL — ABNORMAL HIGH (ref 70–99)
Potassium: 4.1 mEq/L (ref 3.5–5.1)
Sodium: 138 mEq/L (ref 135–145)

## 2019-01-17 LAB — HEMOGLOBIN A1C: Hgb A1c MFr Bld: 10.3 % — ABNORMAL HIGH (ref 4.6–6.5)

## 2019-01-23 ENCOUNTER — Other Ambulatory Visit: Payer: Self-pay | Admitting: Endocrinology

## 2019-01-27 NOTE — Progress Notes (Signed)
Patient ID: Destiny Beasley, female   DOB: Mar 11, 1956, 63 y.o.   MRN: [id number]  Today's office visit was provided via telemedicine using video technique Explained to the patient and the the limitations of evaluation and management by telemedicine and the availability of in person appointments.  The patient understood the limitations and agreed to proceed. Patient also understood that the telehealth visit is billable. . Location of the patient: Home . Location of the provider: Office Only the patient and myself were participating in the encounter   Reason for Appointment: Type II Diabetes follow-up   History of Present Illness    Date of diagnosis: 10/2008  Previous history: She had markedly increased blood sugars at diagnosis and was tried on oral hypoglycemic drugs and Victoza for about 9 months before starting an insulin.  Since 2011 she had been on basal bolus insulin regimen Previously had had difficulty controlling her diabetes mostly because of difficulty with compliance with various aspects of self-care.  Previous regimen: Toujeo 60 units at 8 am . Apidra 20 units at times  She was started on the Omnipod insulin pump on 09/11/16  Recent history:   Insulin regimen: HUMALOG U-200 17-20 units 3 times daily Lantus 40 units daily   Non-insulin hypoglycemic drugs: Victoza 1.8 mg daily   Her A1c has been consistently high, now 10.3 and significantly higher  She has not been seen in follow-up since 08/14/2018         Current management, blood sugar patterns and problems:  She is not using the Dexcom sensor or the insulin pump any more  She says because of her high deductible she cannot afford these devices  Also she is now taking only Lantus insulin, previously had been on Toujeo  Even though she was previously taking 60 units of basal insulin she is only taking 40 units now  Also taking rather small amounts of mealtime insulin  After much discussion she  reveals that she takes her Humalog an hour after eating and not clear why  Blood sugars are averaging nearly 300 in the morning and generally higher the rest of the day  She says she is losing weight and is down to 247 pounds although her weight elsewhere was 259 last month  She is trying to exercise on treadmill 30 to 45 minutes up to 5 days a week  She thinks she is taking Victoza regularly  No hypoglycemia documented  Side effects from medications: Candidiasis from Invokana   GLUCOSE readings:   PRE-MEAL Fasting Lunch Dinner Bedtime Overall  Glucose range:  210-370  265, 348  265-346    Mean/median:  282     304   POST-MEAL PC Breakfast PC Lunch PC Dinner  Glucose range:   440, 426  245-362  Mean/median:      Previous readings:  MORNING blood sugar range 201-282 with only 3 readings Evening blood sugar range 131-381 with average blood sugar about 280  Overall average blood sugar 273   Dietician visit: Most recent: 04/2010 Weight control:  Wt Readings from Last 3 Encounters:  01/01/19 259 lb (117.5 kg)  09/05/18 259 lb 4.2 oz (117.6 kg)  07/02/18 262 lb 12.8 oz (119.2 kg)         Diabetes labs:  Lab Results  Component Value Date   HGBA1C 10.3 (H) 01/17/2019   HGBA1C 8.4 (H) 09/04/2018   HGBA1C 8.3 (H) 06/11/2018   Lab Results  Component Value Date   MICROALBUR 0.9 06/11/2018  LDLCALC 47 08/12/2018   CREATININE 0.89 01/17/2019      No visits with results within 1 Day(s) from this visit.  Latest known visit with results is:  Lab on 01/17/2019  Component Date Value Ref Range Status  . Sodium 01/17/2019 138  135 - 145 mEq/L Final  . Potassium 01/17/2019 4.1  3.5 - 5.1 mEq/L Final  . Chloride 01/17/2019 101  96 - 112 mEq/L Final  . CO2 01/17/2019 30  19 - 32 mEq/L Final  . Glucose, Bld 01/17/2019 250* 70 - 99 mg/dL Final  . BUN 01/17/2019 16  6 - 23 mg/dL Final  . Creatinine, Ser 01/17/2019 0.89  0.40 - 1.20 mg/dL Final  . Calcium 01/17/2019 9.5  8.4  - 10.5 mg/dL Final  . GFR 01/17/2019 78.03  >60.00 mL/min Final  . Hgb A1c MFr Bld 01/17/2019 10.3* 4.6 - 6.5 % Final   Glycemic Control Guidelines for People with Diabetes:Non Diabetic:  <6%Goal of Therapy: <7%Additional Action Suggested:  >8%      Allergies as of 01/28/2019      Reactions   Hydrocodone Other (See Comments)   Severe anxiety Confusion   Invokana [canagliflozin] Other (See Comments)   Constant yeast infections   Imdur [isosorbide Nitrate] Other (See Comments)   Patient reported wheezing   Atorvastatin Other (See Comments)   DIZZINESS      Medication List       Accurate as of January 27, 2019  9:11 PM. If you have any questions, ask your nurse or doctor.        aspirin EC 81 MG tablet Take 1 tablet (81 mg total) by mouth daily.   carvedilol 12.5 MG tablet Commonly known as: COREG Take 1 tablet (12.5 mg total) by mouth 2 (two) times daily.   Dexcom G6 Transmitter Misc 1 each by Does not apply route daily.   Evolocumab 140 MG/ML Soaj Commonly known as: Repatha SureClick Inject [id number] mg into the skin every 14 (fourteen) days.   FREESTYLE TEST STRIPS test strip Generic drug: glucose blood USE THREE TIMES DAILY TO CHECK BLOOD SUGAR   furosemide 20 MG tablet Commonly known as: LASIX Take 1 tablet (20 mg total) by mouth daily.   HumaLOG KwikPen 200 UNIT/ML Sopn Generic drug: Insulin Lispro INJECT 80 UNITS INTO THE PUMP ONCE DAILY   irbesartan 75 MG tablet Commonly known as: AVAPRO Take 1 tablet (75 mg total) by mouth daily.   MULTIPLE VITAMIN PO Take 1 tablet daily by mouth.   pravastatin 20 MG tablet Commonly known as: PRAVACHOL Take 1 tablet by mouth once a week. On Friday   Venlafaxine HCl 150 MG Tb24 Take 150 mg by mouth daily with breakfast.   Victoza 18 MG/3ML Sopn Generic drug: liraglutide ADMINISTER 1.8 MG UNDER THE SKIN DAILY   VITAMIN D-3 PO Take 1 tablet by mouth daily.       Allergies:  Allergies  Allergen Reactions  .  Hydrocodone Other (See Comments)    Severe anxiety Confusion   . Invokana [Canagliflozin] Other (See Comments)    Constant yeast infections  . Imdur [Isosorbide Nitrate] Other (See Comments)    Patient reported wheezing  . Atorvastatin Other (See Comments)    DIZZINESS    Past Medical History:  Diagnosis Date  . Anemia   . Anxiety   . CAD in native artery    a. Inf STEMI 123XX123 complicated by cardiogenic shock/complete heart block s/p emergent cath showing culprit large dominant LCx s/p aspiration  thrombectomy and DES, EF 50% by cath, 40-45% by echo.  . Chronic systolic heart failure ([place]) 06/25/2017   Echo 11/19: mild LVH, EF 40-45, inf-lat HK, Gr 1 DD, trivial MR  . Complete heart block, transient ([place])    a. 06/2017 in setting of acute inf MI -> resolved after PCI.  . Diabetes mellitus   . Goiter   . History of radiation therapy 06/14/2016 - 07/19/2016   Right Breast 50 Gy 25 fractions  . Hypercholesteremia   . Hypertension   . Ischemic cardiomyopathy    a. EF 50% by cath and 40-45% by echo 06/2017.  . Malignant neoplasm of upper-outer quadrant of right female breast ([place]) 02/24/2016  . Meningioma ([place])   . Nuclear stress test    Nuclear stress test 10/19: EF 35, inferolateral, apical scar; inferior, apical inferior infarct with mild peri-infarct ischemia; high risk  . Obesity   . Personal history of chemotherapy   . Personal history of radiation therapy   . Pituitary tumor   . Seasonal asthma   . Sleep apnea    does not use every night  . Stroke [facility])    TIA - Dec 1 st, 2017    Past Surgical History:  Procedure Laterality Date  . ABDOMINAL HYSTERECTOMY    . BRAIN MENINGIOMA EXCISION  2005  . BREAST BIOPSY    . BREAST LUMPECTOMY Right    2017  . BREAST LUMPECTOMY WITH RADIOACTIVE SEED LOCALIZATION Right 04/20/2016   Procedure: RIGHT BREAST LUMPECTOMY WITH RADIOACTIVE SEED LOCALIZATION;  Surgeon: Fanny Skates, MD;  Location: [place] [facility];  Service:  General;  Laterality: Right;  . BREAST REDUCTION SURGERY Bilateral 10/23/2016   Procedure: BILATERAL BREAST REDUCTION WITH LIPOSUCTION ASSISTANCE;  Surgeon: Cristine Polio, MD;  Location: [place];  Service: Plastics;  Laterality: Bilateral;  . CESAREAN SECTION    . CORONARY STENT INTERVENTION N/A 06/25/2017   Procedure: CORONARY STENT INTERVENTION;  Surgeon: Sherren Mocha, MD;  Location: [place] CV LAB;  Service: Cardiovascular;  Laterality: N/A;  . CORONARY/GRAFT ACUTE MI REVASCULARIZATION N/A 06/25/2017   Procedure: Coronary/Graft Acute MI Revascularization;  Surgeon: Sherren Mocha, MD;  Location: [place] CV LAB;  Service: Cardiovascular;  Laterality: N/A;  . FOOT SURGERY Bilateral 2016   hammer toe surgery  . LEFT HEART CATH AND CORONARY ANGIOGRAPHY N/A 06/25/2017   Procedure: LEFT HEART CATH AND CORONARY ANGIOGRAPHY;  Surgeon: Sherren Mocha, MD;  Location: [place] CV LAB;  Service: Cardiovascular;  Laterality: N/A;  . MENISCUS REPAIR Left 2015  . PITUITARY SURGERY    . REDUCTION MAMMAPLASTY    . RIGHT HEART CATH N/A 06/25/2017   Procedure: RIGHT HEART CATH;  Surgeon: Sherren Mocha, MD;  Location: [place] CV LAB;  Service: Cardiovascular;  Laterality: N/A;  . THYROID SURGERY      Family History  Problem Relation Age of Onset  . Diabetes Mother   . Hyperlipidemia Mother   . Hypertension Mother     Social History:  reports that she has never smoked. She has never used smokeless tobacco. She reports current alcohol use of about 1.0 standard drinks of alcohol per week. She reports that she does not use drugs.  Review of Systems:  Hypertension:  Variable control, blood pressure is managed by cardiologist with readings as follows:  BP Readings from Last 3 Encounters:  01/01/19 (!) 140/96  09/05/18 133/70  07/02/18 140/72     Lipids: She was started on Repatha in 08/2017  LDL below  70 on the last measurement  No recent labs available She does  have a history of MI  Lab Results  Component Value Date   CHOL 115 08/12/2018   HDL 48.10 08/12/2018   LDLCALC 47 08/12/2018   TRIG 96.0 08/12/2018   CHOLHDL 2 08/12/2018   Lab Results  Component Value Date   ALT 19 06/11/2018    Diabetic shoe prescription has been given previously     Examination:   There were no vitals taken for this visit.  There is no height or weight on file to calculate BMI.     ASSESSMENT/ PLAN:    Diabetes type 2 with obesity See history of present illness for detailed discussion of current management, interpretation of her continuous glucose monitoring record, blood sugar patterns and problems identified  Her A1c has usually been consistently high and has gone up to 10.3  With her not taking the insulin to the pump and taking much less insulin than before her blood sugars are totally out of control She thinks she is losing weight but this is likely to be from hyperglycemia She does not understand titrating the basal insulin to get morning sugars down Also does not appear to understand the need for taking mealtime insulin before eating and not 1 hour later  Blood sugar monitoring is also somewhat erratic She does however try to exercise   Recommendations today:   Discussed need to check sugars more consistently She will switch daily Humalog to before each meal Since she is eating a lighter breakfast she can take 20 units in the morning and then 40 units with lunch and dinner for now Discussed objective of adjusting the mealtime dose to keep postprandial readings under 180 or so She will go up another 5 units on the Humalog if postprandial readings are over 200 with that meal Conversely if blood sugars are low normal after meals she can reduce the dose by 5 units for that meal  Basal insulin will be 60 units Lantus in the morning She will go up on this dose another 5 units if morning sugars are consistently over 150 by next week However if  she is showing higher readings in the morning compared to nighttime may consider twice daily doses Also may consider switching to [place] or Toujeo if covered  HYPERTENSION: Blood pressure followed by cardiologist    LIPIDS: Controlled with Repatha, she does need follow-up labs   Follow-up in 6 weeks to make sure she is achieving control  Counseling time on subjects discussed in assessment and plan sections is over 50% of today's 25 minute visit    There are no Patient Instructions on file for this visit. Elayne Snare 01/27/2019, 9:11 PM

## 2019-01-28 ENCOUNTER — Encounter: Payer: Self-pay | Admitting: Endocrinology

## 2019-01-28 ENCOUNTER — Ambulatory Visit (INDEPENDENT_AMBULATORY_CARE_PROVIDER_SITE_OTHER): Payer: 59 | Admitting: Endocrinology

## 2019-01-28 ENCOUNTER — Other Ambulatory Visit: Payer: Self-pay

## 2019-01-28 DIAGNOSIS — E7800 Pure hypercholesterolemia, unspecified: Secondary | ICD-10-CM | POA: Diagnosis not present

## 2019-01-28 DIAGNOSIS — E1165 Type 2 diabetes mellitus with hyperglycemia: Secondary | ICD-10-CM

## 2019-01-28 DIAGNOSIS — Z794 Long term (current) use of insulin: Secondary | ICD-10-CM | POA: Diagnosis not present

## 2019-03-17 ENCOUNTER — Other Ambulatory Visit: Payer: Self-pay | Admitting: Endocrinology

## 2019-03-25 ENCOUNTER — Other Ambulatory Visit: Payer: Self-pay | Admitting: Obstetrics and Gynecology

## 2019-03-25 DIAGNOSIS — Z853 Personal history of malignant neoplasm of breast: Secondary | ICD-10-CM

## 2019-03-25 DIAGNOSIS — Z9889 Other specified postprocedural states: Secondary | ICD-10-CM

## 2019-03-28 ENCOUNTER — Other Ambulatory Visit: Payer: Self-pay | Admitting: Physician Assistant

## 2019-03-31 ENCOUNTER — Other Ambulatory Visit: Payer: Self-pay | Admitting: Cardiovascular Disease

## 2019-03-31 MED ORDER — FUROSEMIDE 20 MG PO TABS: 20.0000 mg | ORAL_TABLET | Freq: Every day | ORAL | 2 refills | Status: DC

## 2019-04-02 ENCOUNTER — Telehealth: Payer: Self-pay | Admitting: *Deleted

## 2019-04-02 NOTE — Telephone Encounter (Signed)
Call placed to pt off recall list.  Pt has agreed, verbally, for virtual appt with Robbie Lis, PA-C 04/03/2019 at 9:30.     Virtual Visit Pre-Appointment Phone Call  "(Name), I am calling you today to discuss your upcoming appointment. We are currently trying to limit exposure to the virus that causes COVID-19 by seeing patients at home rather than in the office."  1. "What is the BEST phone number to call the day of the visit?" - include this in appointment notes  2. "Do you have or have access to (through a family member/friend) a smartphone with video capability that we can use for your visit?" a. If yes - list this number in appt notes as "cell" (if different from BEST phone #) and list the appointment type as a VIDEO visit in appointment notes b. If no - list the appointment type as a PHONE visit in appointment notes  3. Confirm consent - "In the setting of the current Covid19 crisis, you are scheduled for a (phone or video) visit with your provider on (date) at (time).  Just as we do with many in-office visits, in order for you to participate in this visit, we must obtain consent.  If you'd like, I can send this to your mychart (if signed up) or email for you to review.  Otherwise, I can obtain your verbal consent now.  All virtual visits are billed to your insurance company just like a normal visit would be.  By agreeing to a virtual visit, we'd like you to understand that the technology does not allow for your provider to perform an examination, and thus may limit your provider's ability to fully assess your condition. If your provider identifies any concerns that need to be evaluated in person, we will make arrangements to do so.  Finally, though the technology is pretty good, we cannot assure that it will always work on either your or our end, and in the setting of a video visit, we may have to convert it to a phone-only visit.  In either situation, we cannot ensure that we have a secure  connection.  Are you willing to proceed?" STAFF: Did the patient verbally acknowledge consent to telehealth visit? Document YES/NO here: YES VERBAL PERMISSION   4. Advise patient to be prepared - "Two hours prior to your appointment, go ahead and check your blood pressure, pulse, oxygen saturation, and your weight (if you have the equipment to check those) and write them all down. When your visit starts, your provider will ask you for this information. If you have an Apple Watch or Kardia device, please plan to have heart rate information ready on the day of your appointment. Please have a pen and paper handy nearby the day of the visit as well."  5. Give patient instructions for MyChart download to smartphone OR Doximity/Doxy.me as below if video visit (depending on what platform provider is using)  6. Inform patient they will receive a phone call 15 minutes prior to their appointment time (may be from unknown caller ID) so they should be prepared to answer    TELEPHONE CALL NOTE  Destiny Beasley has been deemed a candidate for a follow-up tele-health visit to limit community exposure during the Covid-19 pandemic. I spoke with the patient via phone to ensure availability of phone/video source, confirm preferred email & phone number, and discuss instructions and expectations.  I reminded Destiny Beasley to be prepared with any vital sign and/or heart rhythm  information that could potentially be obtained via home monitoring, at the time of her visit. I reminded Destiny Beasley to expect a phone call prior to her visit.  Jeanann Lewandowsky, [place] 04/02/2019 3:03 PM   INSTRUCTIONS FOR DOWNLOADING THE MYCHART APP TO SMARTPHONE  - The patient must first make sure to have activated MyChart and know their login information - If Apple, go to [employer] and type in MyChart in the search bar and download the app. If Android, ask patient to go to [employer] and type in [place] Claus in the search bar and download  the app. The app is free but as with any other app downloads, their phone may require them to verify saved payment information or Apple/Android password.  - The patient will need to then log into the app with their MyChart username and password, and select [facility] as their healthcare provider to link the account. When it is time for your visit, go to the MyChart app, find appointments, and click Begin Video Visit. Be sure to Select Allow for your device to access the Microphone and Camera for your visit. You will then be connected, and your provider will be with you shortly.  **If they have any issues connecting, or need assistance please contact MyChart service desk [phone])**  **If using a computer, in order to ensure the best quality for their visit they will need to use either of the following Internet Browsers: [employer], or Google Chrome**  IF USING DOXIMITY or DOXY.ME - The patient will receive a link just prior to their visit by text.     FULL LENGTH CONSENT FOR TELE-HEALTH VISIT   I hereby voluntarily request, consent and authorize [facility] and its employed or contracted physicians, physician assistants, nurse practitioners or other licensed health care professionals (the Practitioner), to provide me with telemedicine health care services (the "Services") as deemed necessary by the treating Practitioner. I acknowledge and consent to receive the Services by the Practitioner via telemedicine. I understand that the telemedicine visit will involve communicating with the Practitioner through live audiovisual communication technology and the disclosure of certain medical information by electronic transmission. I acknowledge that I have been given the opportunity to request an in-person assessment or other available alternative prior to the telemedicine visit and am voluntarily participating in the telemedicine visit.  I understand that I have the right to withhold  or withdraw my consent to the use of telemedicine in the course of my care at any time, without affecting my right to future care or treatment, and that the Practitioner or I may terminate the telemedicine visit at any time. I understand that I have the right to inspect all information obtained and/or recorded in the course of the telemedicine visit and may receive copies of available information for a reasonable fee.  I understand that some of the potential risks of receiving the Services via telemedicine include:  Marland Kitchen Delay or interruption in medical evaluation due to technological equipment failure or disruption; . Information transmitted may not be sufficient (e.g. poor resolution of images) to allow for appropriate medical decision making by the Practitioner; and/or  . In rare instances, security protocols could fail, causing a breach of personal health information.  Furthermore, I acknowledge that it is my responsibility to provide information about my medical history, conditions and care that is complete and accurate to the best of my ability. I acknowledge that Practitioner's advice, recommendations, and/or decision may be based on factors  not within their control, such as incomplete or inaccurate data provided by me or distortions of diagnostic images or specimens that may result from electronic transmissions. I understand that the practice of medicine is not an exact science and that Practitioner makes no warranties or guarantees regarding treatment outcomes. I acknowledge that I will receive a copy of this consent concurrently upon execution via email to the email address I last provided but may also request a printed copy by calling the office of [facility].    I understand that my insurance will be billed for this visit.   I have read or had this consent read to me. . I understand the contents of this consent, which adequately explains the benefits and risks of the Services being provided  via telemedicine.  . I have been provided ample opportunity to ask questions regarding this consent and the Services and have had my questions answered to my satisfaction. . I give my informed consent for the services to be provided through the use of telemedicine in my medical care  By participating in this telemedicine visit I agree to the above.

## 2019-04-02 NOTE — Progress Notes (Signed)
Virtual Visit via Video Note   This visit type was conducted due to national recommendations for restrictions regarding the COVID-19 Pandemic (e.g. social distancing) in an effort to limit this patient's exposure and mitigate transmission in our community.  Due to her co-morbid illnesses, this patient is at least at moderate risk for complications without adequate follow up.  This format is felt to be most appropriate for this patient at this time.  All issues noted in this document were discussed and addressed.  A limited physical exam was performed with this format.  Please refer to the patient's chart for her consent to telehealth for [facility].   Date:  04/03/2019   ID:  Destiny Beasley, DOB June 14, 1955, MRN [id number]  Patient Location: Home Provider Location: Home  PCP:  Lawerance Cruel, MD  Cardiologist:  Sherren Mocha, MD  Evaluation Performed:  Follow-Up Visit  Chief Complaint:  3 months follow up  History of Present Illness:    Destiny Beasley is a 63 y.o. female with hx of CAD, ICM, DM, TIA, HTN, HLD, obstructive sleep apnea on CPAP and breast cancer seen for follow up.  Hx of CAD s/p  inferolateral STEMI in March [id number] complicated by cardiogenic shock, acute systolic heart failure and complete heart block. Cardiac catheterization demonstrated an occluded proximal left circumflex which was treated with a drug-eluting stent. EF is 40-45% by echocardiogram. Myoview in 01/2018 demonstrated inferolateral, apical scar; inferior, apical inferior infarct with mild peri-infarct ischemia and an EF of 35%. This was reviewed with Dr. Burt Knack. It was felt that the findings represented her known disease and medical Rx could be continued. If she has progressive symptoms, she would need a Cardiac Catheterization. Last Echo 08/2018 showed LVEF of 40-45%.   Last seen by Richardson Dopp 12/2018.  Here today for follow up.  Patient reports systolic blood pressure always above 150.  She has  noted intermittent wheezing since started on Coreg.  She denies chest pain, shortness of breath, orthopnea, PND, syncope, lower extremity edema or melena.  Compliant with CPAP.  Denies stressful life.  The patient does not have symptoms concerning for COVID-19 infection (fever, chills, cough, or new shortness of breath).    Past Medical History:  Diagnosis Date  . Anemia   . Anxiety   . CAD in native artery    a. Inf STEMI [id number] complicated by cardiogenic shock/complete heart block s/p emergent cath showing culprit large dominant LCx s/p aspiration thrombectomy and DES, EF 50% by cath, 40-45% by echo.  . Chronic systolic heart failure ([place]) 06/25/2017   Echo 11/19: mild LVH, EF 40-45, inf-lat HK, Gr 1 DD, trivial MR  . Complete heart block, transient ([place])    a. 06/2017 in setting of acute inf MI -> resolved after PCI.  . Diabetes mellitus   . Goiter   . History of radiation therapy 06/14/2016 - 07/19/2016   Right Breast 50 Gy 25 fractions  . Hypercholesteremia   . Hypertension   . Ischemic cardiomyopathy    a. EF 50% by cath and 40-45% by echo 06/2017.  . Malignant neoplasm of upper-outer quadrant of right female breast ([place]) 02/24/2016  . Meningioma ([place])   . Nuclear stress test    Nuclear stress test 10/19: EF 35, inferolateral, apical scar; inferior, apical inferior infarct with mild peri-infarct ischemia; high risk  . Obesity   . Personal history of chemotherapy   . Personal history of radiation therapy   . Pituitary tumor   .  Seasonal asthma   . Sleep apnea    does not use every night  . Stroke [facility])    TIA - Dec 1 st, 2017   Past Surgical History:  Procedure Laterality Date  . ABDOMINAL HYSTERECTOMY    . BRAIN MENINGIOMA EXCISION  2005  . BREAST BIOPSY    . BREAST LUMPECTOMY Right    2017  . BREAST LUMPECTOMY WITH RADIOACTIVE SEED LOCALIZATION Right 04/20/2016   Procedure: RIGHT BREAST LUMPECTOMY WITH RADIOACTIVE SEED LOCALIZATION;  Surgeon: Fanny Skates, MD;   Location: [facility];  Service: General;  Laterality: Right;  . BREAST REDUCTION SURGERY Bilateral 10/23/2016   Procedure: BILATERAL BREAST REDUCTION WITH LIPOSUCTION ASSISTANCE;  Surgeon: Cristine Polio, MD;  Location: [facility];  Service: Plastics;  Laterality: Bilateral;  . CESAREAN SECTION    . CORONARY STENT INTERVENTION N/A 06/25/2017   Procedure: CORONARY STENT INTERVENTION;  Surgeon: Sherren Mocha, MD;  Location: [facility];  Service: Cardiovascular;  Laterality: N/A;  . CORONARY/GRAFT ACUTE MI REVASCULARIZATION N/A 06/25/2017   Procedure: Coronary/Graft Acute MI Revascularization;  Surgeon: Sherren Mocha, MD;  Location: [facility];  Service: Cardiovascular;  Laterality: N/A;  . FOOT SURGERY Bilateral 2016   hammer toe surgery  . LEFT HEART CATH AND CORONARY ANGIOGRAPHY N/A 06/25/2017   Procedure: LEFT HEART CATH AND CORONARY ANGIOGRAPHY;  Surgeon: Sherren Mocha, MD;  Location: [facility];  Service: Cardiovascular;  Laterality: N/A;  . MENISCUS REPAIR Left 2015  . PITUITARY SURGERY    . REDUCTION MAMMAPLASTY    . RIGHT HEART CATH N/A 06/25/2017   Procedure: RIGHT HEART CATH;  Surgeon: Sherren Mocha, MD;  Location: [facility];  Service: Cardiovascular;  Laterality: N/A;  . THYROID SURGERY      Prior to Admission medications   Medication Sig Start Date End Date Taking? Authorizing Provider  aspirin EC 81 MG tablet Take 1 tablet (81 mg total) by mouth daily. 01/01/19  Yes Weaver, Scott T, PA-C  Cholecalciferol (VITAMIN D-3 PO) Take 1 tablet by mouth daily.    Yes [provider]  Continuous Blood Gluc Transmit (DEXCOM G6 TRANSMITTER) MISC 1 each by Does not apply route daily. 03/27/18  Yes Elayne Snare, MD  FREESTYLE TEST STRIPS test strip USE THREE TIMES DAILY TO CHECK BLOOD SUGAR 11/22/17  Yes Elayne Snare, MD  furosemide (LASIX) 20 MG tablet Take 1 tablet (20 mg total) by mouth daily. 03/31/19  Yes Sherren Mocha, MD   HUMALOG KWIKPEN 200 UNIT/ML SOPN INJECT 80 UNITS INTO THE PUMP ONCE DAILY 11/12/18  Yes Elayne Snare, MD  irbesartan (AVAPRO) 75 MG tablet Take 1 tablet (75 mg total) by mouth daily. 06/29/17  Yes Dunn, Dayna N, PA-C  MULTIPLE VITAMIN PO Take 1 tablet daily by mouth.   Yes [provider]  pravastatin (PRAVACHOL) 20 MG tablet Take 1 tablet by mouth once a week. On Friday 10/16/18  Yes [provider]  Venlafaxine HCl 150 MG TB24 Take 150 mg by mouth daily with breakfast.    Yes [provider]  VICTOZA 18 MG/3ML SOPN INJECT 1.8MG  UNDER THE SKIN ONCE DAILY 03/18/19  Yes Elayne Snare, MD  carvedilol (COREG) 12.5 MG tablet Take 1 tablet (12.5 mg total) by mouth 2 (two) times daily. 01/01/19 04/01/19  Richardson Dopp T, PA-C      Allergies:   Hydrocodone, Invokana [canagliflozin], Imdur [isosorbide nitrate], and Atorvastatin   Social History   Tobacco Use  . Smoking status:  Never Smoker  . Smokeless tobacco: Never Used  Substance Use Topics  . Alcohol use: Yes    Alcohol/week: 1.0 standard drinks    Types: 1 Glasses of wine per week    Comment: 1 glass monthly  . Drug use: No     Family Hx: The patient's family history includes Diabetes in her mother; Hyperlipidemia in her mother; Hypertension in her mother.  ROS:   Please see the history of present illness.    All other systems reviewed and are negative.   Prior CV studies:   The following studies were reviewed today:  Echo 08/2018 1. The left ventricle has mild-moderately reduced systolic function, with an ejection fraction of 40-45%. The cavity size was normal. There is mildly increased left ventricular wall thickness. Left ventricular diastolic Doppler parameters are consistent  with impaired relaxation.  2. The right ventricle has normal systolic function. The cavity was normal.  3. The mitral valve is grossly normal.  4. The tricuspid valve is grossly normal.  5. The aortic valve is tricuspid. No stenosis  of the aortic valve.  6. Akinesis of the inferolateral wall with overall mild to moderate LV dysfunction; mild diastolic dysfunction; mild LVH.  Labs/Other Tests and Data Reviewed:    EKG:  No ECG reviewed.  Recent Labs: 06/11/2018: ALT 19 09/04/2018: Hemoglobin 13.0; Platelets 303 09/05/2018: Magnesium 1.8 01/17/2019: BUN 16; Creatinine, Ser 0.89; Potassium 4.1; Sodium 138   Recent Lipid Panel Lab Results  Component Value Date/Time   CHOL 115 08/12/2018 09:44 AM   TRIG 96.0 08/12/2018 09:44 AM   HDL 48.10 08/12/2018 09:44 AM   CHOLHDL 2 08/12/2018 09:44 AM   LDLCALC 47 08/12/2018 09:44 AM    Wt Readings from Last 3 Encounters:  04/03/19 259 lb (117.5 kg)  01/01/19 259 lb (117.5 kg)  09/05/18 259 lb 4.2 oz (117.6 kg)     Objective:    Vital Signs:  BP (!) 174/96   Ht 6' (1.829 m)   Wt 259 lb (117.5 kg)   BMI 35.13 kg/m    VITAL SIGNS:  reviewed GEN:  no acute distress EYES:  sclerae anicteric, EOMI - Extraocular Movements Intact RESPIRATORY:  normal respiratory effort, symmetric expansion CARDIOVASCULAR:  no peripheral edema SKIN:  no rash, lesions or ulcers. MUSCULOSKELETAL:  no obvious deformities. NEURO:  alert and oriented x 3, no obvious focal deficit PSYCH:  normal affect  ASSESSMENT & PLAN:    1. CAD Denies angina.  Continue current medical therapy.  2. Chronic systolic CHF LV function remains stable at 40 to 45%.  No CHF exacerbating symptoms.  Continue Coreg and ibesartan at current dose.  3. HTN Uncontrolled.  Systolic blood pressure never below 150.  Patient has noted intermittent wheezing since started on Coreg.  Will not uptitrate.  May consider reducing dose once better blood pressure.  She is compliant with her CPAP.  Continue ibesartan 300 mg daily.  Add amlodipine 5 mg daily.  Reevaluate in 2 weeks.  May consider renal artery Doppler if uncontrolled blood pressure persist despite aggressive therapy.  4. HLD Continue current medical therapy  5.  DM Managed by PCP  COVID-19 Education: The signs and symptoms of COVID-19 were discussed with the patient and how to seek care for testing (follow up with PCP or arrange E-visit).  The importance of social distancing was discussed today.  Time:   Today, I have spent 9 minutes with the patient with telehealth technology discussing the above problems.  Medication Adjustments/Labs and Tests Ordered: Current medicines are reviewed at length with the patient today.  Concerns regarding medicines are outlined above.   Tests Ordered: No orders of the defined types were placed in this encounter.   Medication Changes: Meds ordered this encounter  Medications  . amLODipine (NORVASC) 5 MG tablet    Sig: Take 1 tablet (5 mg total) by mouth daily.    Dispense:  90 tablet    Refill:  3    Follow Up:  Virtual Visit  in 2 week(s)  Signed, Leanor Kail, PA  04/03/2019 12:32 PM    [facility]

## 2019-04-03 ENCOUNTER — Encounter: Payer: Self-pay | Admitting: Physician Assistant

## 2019-04-03 ENCOUNTER — Other Ambulatory Visit: Payer: Self-pay

## 2019-04-03 ENCOUNTER — Telehealth (INDEPENDENT_AMBULATORY_CARE_PROVIDER_SITE_OTHER): Payer: 59 | Admitting: Physician Assistant

## 2019-04-03 VITALS — BP 174/96 | Ht 72.0 in | Wt 259.0 lb

## 2019-04-03 DIAGNOSIS — I25119 Atherosclerotic heart disease of native coronary artery with unspecified angina pectoris: Secondary | ICD-10-CM

## 2019-04-03 DIAGNOSIS — I5022 Chronic systolic (congestive) heart failure: Secondary | ICD-10-CM

## 2019-04-03 DIAGNOSIS — G4733 Obstructive sleep apnea (adult) (pediatric): Secondary | ICD-10-CM

## 2019-04-03 DIAGNOSIS — I10 Essential (primary) hypertension: Secondary | ICD-10-CM

## 2019-04-03 DIAGNOSIS — E785 Hyperlipidemia, unspecified: Secondary | ICD-10-CM

## 2019-04-03 MED ORDER — AMLODIPINE BESYLATE 5 MG PO TABS: 5.0000 mg | ORAL_TABLET | Freq: Every day | ORAL | 3 refills | Status: DC

## 2019-04-03 NOTE — Patient Instructions (Signed)
Medication Instructions:  START: Amlodipine 5 mg once a day   *If you need a refill on your cardiac medications before your next appointment, please call your pharmacy*  Lab Work: None ordered   If you have labs (blood work) drawn today and your tests are completely normal, you will receive your results only by: Marland Kitchen MyChart Message (if you have MyChart) OR . A paper copy in the mail If you have any lab test that is abnormal or we need to change your treatment, we will call you to review the results.  Testing/Procedures: None ordered   Follow-Up: You are scheduled to see Richardson Dopp, PA-C on 04/22/2019 @ 12:15 PM\  Other Instructions None

## 2019-04-05 ENCOUNTER — Other Ambulatory Visit: Payer: Self-pay | Admitting: Endocrinology

## 2019-04-21 ENCOUNTER — Ambulatory Visit
Admission: RE | Admit: 2019-04-21 | Discharge: 2019-04-21 | Disposition: A | Discharge status: Home / Self Care | Payer: 59 | Source: Ambulatory Visit | Attending: Obstetrics and Gynecology | Admitting: Obstetrics and Gynecology

## 2019-04-21 ENCOUNTER — Other Ambulatory Visit: Payer: Self-pay

## 2019-04-21 DIAGNOSIS — Z853 Personal history of malignant neoplasm of breast: Secondary | ICD-10-CM

## 2019-04-21 DIAGNOSIS — Z9889 Other specified postprocedural states: Secondary | ICD-10-CM

## 2019-04-22 ENCOUNTER — Ambulatory Visit (INDEPENDENT_AMBULATORY_CARE_PROVIDER_SITE_OTHER): Payer: 59 | Admitting: Physician Assistant

## 2019-04-22 ENCOUNTER — Encounter: Payer: Self-pay | Admitting: Physician Assistant

## 2019-04-22 VITALS — BP 156/87 | HR 94 | Ht 72.0 in | Wt 262.0 lb

## 2019-04-22 DIAGNOSIS — E785 Hyperlipidemia, unspecified: Secondary | ICD-10-CM | POA: Diagnosis not present

## 2019-04-22 DIAGNOSIS — I10 Essential (primary) hypertension: Secondary | ICD-10-CM | POA: Diagnosis not present

## 2019-04-22 DIAGNOSIS — I5022 Chronic systolic (congestive) heart failure: Secondary | ICD-10-CM | POA: Diagnosis not present

## 2019-04-22 DIAGNOSIS — I25119 Atherosclerotic heart disease of native coronary artery with unspecified angina pectoris: Secondary | ICD-10-CM

## 2019-04-22 MED ORDER — AMLODIPINE BESYLATE 10 MG PO TABS: 10.0000 mg | ORAL_TABLET | Freq: Every day | ORAL | 3 refills | Status: DC

## 2019-04-22 NOTE — Patient Instructions (Signed)
Medication Instructions:  Your physician has recommended you make the following change in your medication:  1.  INCREASE the Amlodipine to 10 mg taking 1 tablet daily  *If you need a refill on your cardiac medications before your next appointment, please call your pharmacy*  Lab Work: Tomorrow, 04/23/2019: Come to the office fasting for LIPID  If you have labs (blood work) drawn today and your tests are completely normal, you will receive your results only by: Marland Kitchen MyChart Message (if you have MyChart) OR . A paper copy in the mail If you have any lab test that is abnormal or we need to change your treatment, we will call you to review the results.  Testing/Procedures: None ordered  Follow-Up: At [facility], you and your health needs are our priority.  As part of our continuing mission to provide you with exceptional heart care, we have created designated Provider Care Teams.  These Care Teams include your primary Cardiologist (physician) and Advanced Practice Providers (APPs -  Physician Assistants and Nurse Practitioners) who all work together to provide you with the care you need, when you need it.  Your next appointment:   06/06/2019   The format for your next appointment:   Virtual Visit   Provider:   Richardson Dopp, PA-C  Other Instructions

## 2019-04-22 NOTE — Progress Notes (Signed)
Cardiology Office Note:    Date:  04/22/2019   ID:  Destiny Beasley, DOB 24-Jun-1955, MRN [id number]  PCP:  Lawerance Cruel, MD  Cardiologist:  Sherren Mocha, MD  Electrophysiologist:  None   Referring MD: Lawerance Cruel, MD   Chief Complaint  Patient presents with  . Follow-up    HTN    History of Present Illness:    Destiny Beasley is a 63 y.o. female with:   Coronary artery disease  ? S/p inf-lat STEMI in 06/2017 c/b CGS, acute HFrEF, CHB >> PCI:  DES to pLCx ? Myoview 10/19: inferior scar, mild peri-infarct ischemia  Chronic Systolic CHF ? Ischemic CM ? Echocardiogram 3/19: EF 40-45 ? Echocardiogram 11/19: EF 40-45 ? Echocardiogram 08/2018: EF 40-45  Diabetes mellitus   Prior TIA  OSA  Breast CA   Hypertension   Hyperlipidemia    [name] was last seen by Leanor Kail, PA-C via Telemedicine.  Her BP was uncontrolled and Amlodipine was added to her medical regimen.    She returns for follow up.  She is here alone. She has not had any chest pain, significant shortness of breath, syncope, orthopnea, leg swelling.  She uses CPAP at night.  She does note her diet has been rich in salt recently.  She was working in [place] for a while and eating out.  She does not some wheezing that is associated with taking Carvedilol.  She also is not on Spironolactone due to a hx of intolerance (? Wheezing vs fatigue).   She also had injection site pain with evolocumab and has since stopped this medication.      Prior CV studies:   The following studies were reviewed today:  Echocardiogram 09/05/2018 EF 40-45, inf-lat AK, mild LVH, Gr 1 DD, normal RVSF  Echo 03/07/18 Mild LVH, EF 40-45, GLS -11%, inf-lat HK, Gr 1 DD, trivial MR  Nuclear stress test10/22/19 EF 35, inferolateral, apical scar; inferior, apical inferior infarct with mild peri-infarct ischemia; high risk  Echo 06/26/17 EF 40-45, inferolateral hypokinesis, mild MR  Cardiac catheterization  06/25/17 LM luminal irregularities LAD luminal irregularities, proximal 30 LCx proximal 100 RCA irregularities EF 45-50, inferolateral akinesis PCI: 4 x 20 mm Synergy DES to the proximal LCx  Echo 03/25/16 Moderate LVH, EF 60-65, normal wall motion  Carotid US 12/17 Bilateral ICA 1-39  Nuclear stress test 7/13 1. Negative for pharmacologic-stress induced ischemia. 2. Left ventricular ejection fraction 65%.   Past Medical History:  Diagnosis Date  . Anemia   . Anxiety   . CAD in native artery    a. Inf STEMI 123XX123 complicated by cardiogenic shock/complete heart block s/p emergent cath showing culprit large dominant LCx s/p aspiration thrombectomy and DES, EF 50% by cath, 40-45% by echo.  . Chronic systolic heart failure ([place]) 06/25/2017   Echo 11/19: mild LVH, EF 40-45, inf-lat HK, Gr 1 DD, trivial MR  . Complete heart block, transient ([place])    a. 06/2017 in setting of acute inf MI -> resolved after PCI.  . Diabetes mellitus   . Goiter   . History of radiation therapy 06/14/2016 - 07/19/2016   Right Breast 50 Gy 25 fractions  . Hypercholesteremia   . Hypertension   . Ischemic cardiomyopathy    a. EF 50% by cath and 40-45% by echo 06/2017.  . Malignant neoplasm of upper-outer quadrant of right female breast ([place]) 02/24/2016  . Meningioma ([place])   . Nuclear stress test    Nuclear stress  test 10/19: EF 35, inferolateral, apical scar; inferior, apical inferior infarct with mild peri-infarct ischemia; high risk  . Obesity   . Personal history of chemotherapy   . Personal history of radiation therapy   . Pituitary tumor   . Seasonal asthma   . Sleep apnea    does not use every night  . Stroke [facility])    TIA - Dec 1 st, 2017   Surgical Hx: The patient  has a past surgical history that includes Cesarean section; Abdominal hysterectomy; Thyroid surgery; Pituitary surgery; Brain meningioma excision (2005); Meniscus repair (Left, 2015); Foot surgery (Bilateral, 2016); Breast  lumpectomy with radioactive seed localization (Right, 04/20/2016); Breast reduction surgery (Bilateral, 10/23/2016); Breast lumpectomy (Right); Breast biopsy; Reduction mammaplasty; LEFT HEART CATH AND CORONARY ANGIOGRAPHY (N/A, 06/25/2017); RIGHT HEART CATH (N/A, 06/25/2017); Coronary/Graft Acute MI Revascularization (N/A, 06/25/2017); and CORONARY STENT INTERVENTION (N/A, 06/25/2017).   Current Medications: Current Meds  Medication Sig  . aspirin EC 81 MG tablet Take 1 tablet (81 mg total) by mouth daily.  . Cholecalciferol (VITAMIN D-3 PO) Take 1 tablet by mouth daily.   . Continuous Blood Gluc Transmit (DEXCOM G6 TRANSMITTER) MISC 1 each by Does not apply route daily.  . furosemide (LASIX) 20 MG tablet Take 1 tablet (20 mg total) by mouth daily.  Marland Kitchen HUMALOG KWIKPEN 200 UNIT/ML SOPN INJECT 80 UNITS INTO THE PUMP ONCE DAILY  . irbesartan (AVAPRO) 300 MG tablet Take 300 mg by mouth daily.  . MULTIPLE VITAMIN PO Take 1 tablet daily by mouth.  . pravastatin (PRAVACHOL) 20 MG tablet Take 1 tablet by mouth once a week. On Friday  . Venlafaxine HCl 150 MG TB24 Take 150 mg by mouth daily with breakfast.   . [DISCONTINUED] amLODipine (NORVASC) 5 MG tablet Take 1 tablet (5 mg total) by mouth daily.     Allergies:   Hydrocodone, Invokana [canagliflozin], Imdur [isosorbide nitrate], and Atorvastatin   Social History   Tobacco Use  . Smoking status: Never Smoker  . Smokeless tobacco: Never Used  Substance Use Topics  . Alcohol use: Yes    Alcohol/week: 1.0 standard drinks    Types: 1 Glasses of wine per week    Comment: 1 glass monthly  . Drug use: No     Family Hx: The patient's family history includes Diabetes in her mother; Hyperlipidemia in her mother; Hypertension in her mother.  ROS:   Please see the history of present illness.    ROS All other systems reviewed and are negative.   EKGs/Labs/Other Test Reviewed:    EKG:  EKG isnot ordered today.  The ekg ordered today demonstrates  n/a  Recent Labs: 06/11/2018: ALT 19 09/04/2018: Hemoglobin 13.0; Platelets 303 09/05/2018: Magnesium 1.8 01/17/2019: BUN 16; Creatinine, Ser 0.89; Potassium 4.1; Sodium 138   Recent Lipid Panel Lab Results  Component Value Date/Time   CHOL 115 08/12/2018 09:44 AM   TRIG 96.0 08/12/2018 09:44 AM   HDL 48.10 08/12/2018 09:44 AM   CHOLHDL 2 08/12/2018 09:44 AM   LDLCALC 47 08/12/2018 09:44 AM    Physical Exam:    VS:  BP (!) 156/87   Pulse 94   Ht 6' (1.829 m)   Wt 262 lb (118.8 kg)   SpO2 98%   BMI 35.53 kg/m     Wt Readings from Last 3 Encounters:  04/22/19 262 lb (118.8 kg)  04/03/19 259 lb (117.5 kg)  01/01/19 259 lb (117.5 kg)     Physical Exam  Constitutional: She is oriented  to person, place, and time. She appears well-developed and well-nourished. No distress.  HENT:  Head: Normocephalic and atraumatic.  Eyes: No scleral icterus.  Neck: No JVD present. No thyromegaly present.  Cardiovascular: Normal rate, regular rhythm and normal heart sounds.  No murmur heard. Pulmonary/Chest: Effort normal and breath sounds normal. She has no rales.  Abdominal: Soft. There is no hepatomegaly.  Musculoskeletal:        General: No edema.  Lymphadenopathy:    She has no cervical adenopathy.  Neurological: She is alert and oriented to person, place, and time.  Skin: Skin is warm and dry.  Psychiatric: She has a normal mood and affect.    ASSESSMENT & PLAN:    1. Essential hypertension BP improved but remains above target.  She has a hx of intolerance to nitrates, spironolactone.  She also notes symptoms of wheezing with the Carvedilol.  Lungs are clear on exam today.  She does note that her diet is probably rich in salt.  We discussed limiting salt.  I will increase her amlodipine to 10 mg once daily.  FU via Telemedicine in 6 weeks.   2. Coronary artery disease involving native coronary artery of native heart with angina pectoris [facility]) History of posterior MI in March [id number]  complicated by cardiogenic shock, complete heart block and systolic heart failure.  She was treated with a drug-eluting stent to the LCx.  Myoview in 2019 was low risk.  She is not having angina.  Continue ASA, statin, beta-blocker, angiotensin receptor blocker.    3. Chronic systolic heart failure (HCC) EF 40-45 by Echocardiogram in 08/2018. NYHA 2.  Volume status stable.  Continue current dose of beta-blocker, angiotensin receptor blocker.  She is intol of spironolactone and nitrates.  She may also have some wheezing with carvedilol.  Therefore, I would not increase this further.  Consider adding Hydralazine if BP remains above target.    4. Hyperlipidemia  She is intol of statins.  She had good success with PCSK9 inhibitors. But, she stopped this due to injection site reaction.  I will get a follow up Lipid panel and refer her back to the Lipid Clinic.  Question if she would be a good candidate for Bempedoic Acid.      Dispo:  Return in about 6 weeks (around 06/03/2019) for Routine Follow Up, w/ Richardson Dopp, PA-C, via Telemedicine.   Medication Adjustments/Labs and Tests Ordered: Current medicines are reviewed at length with the patient today.  Concerns regarding medicines are outlined above.  Tests Ordered: Orders Placed This Encounter  Procedures  . Lipid panel  . AMB Referral to [facility]   Medication Changes: Meds ordered this encounter  Medications  . amLODipine (NORVASC) 10 MG tablet    Sig: Take 1 tablet (10 mg total) by mouth daily.    Dispense:  90 tablet    Refill:  3    Signed, Richardson Dopp, PA-C  04/22/2019 1:14 PM    [facility] [place], [place], [place]  [address] Phone: [phone]; Fax: [phone]

## 2019-04-23 ENCOUNTER — Other Ambulatory Visit: Payer: 59 | Admitting: *Deleted

## 2019-04-23 ENCOUNTER — Other Ambulatory Visit: Payer: Self-pay

## 2019-04-23 DIAGNOSIS — I5022 Chronic systolic (congestive) heart failure: Secondary | ICD-10-CM

## 2019-04-23 DIAGNOSIS — I10 Essential (primary) hypertension: Secondary | ICD-10-CM

## 2019-04-23 DIAGNOSIS — I25119 Atherosclerotic heart disease of native coronary artery with unspecified angina pectoris: Secondary | ICD-10-CM

## 2019-04-23 LAB — LIPID PANEL
Chol/HDL Ratio: 3.9 ratio (ref 0.0–4.4)
Cholesterol, Total: 223 mg/dL — ABNORMAL HIGH (ref 100–199)
HDL: 57 mg/dL (ref >39)
LDL Chol Calc (NIH): 141 mg/dL — ABNORMAL HIGH (ref 0–99)
Triglycerides: 138 mg/dL (ref 0–149)
VLDL Cholesterol Cal: 25 mg/dL (ref 5–40)

## 2019-04-24 ENCOUNTER — Other Ambulatory Visit: Payer: Self-pay

## 2019-04-24 MED ORDER — EZETIMIBE 10 MG PO TABS: 10.0000 mg | ORAL_TABLET | Freq: Every day | ORAL | 3 refills | Status: DC

## 2019-04-25 ENCOUNTER — Other Ambulatory Visit: Payer: Self-pay | Admitting: Endocrinology

## 2019-04-28 ENCOUNTER — Ambulatory Visit: Payer: 59

## 2019-04-28 ENCOUNTER — Ambulatory Visit (INDEPENDENT_AMBULATORY_CARE_PROVIDER_SITE_OTHER): Payer: 59 | Admitting: Pharmacist

## 2019-04-28 ENCOUNTER — Other Ambulatory Visit: Payer: Self-pay

## 2019-04-28 DIAGNOSIS — E7800 Pure hypercholesterolemia, unspecified: Secondary | ICD-10-CM | POA: Diagnosis not present

## 2019-04-28 MED ORDER — REPATHA SURECLICK 140 MG/ML ~~LOC~~ SOAJ: 1.0000 "pen " | SUBCUTANEOUS | 11 refills | Status: DC

## 2019-04-28 NOTE — Patient Instructions (Addendum)
It was nice to see you today  Continue taking your Repatha every 2 weeks  We will recheck cholesterol when you see Nicki Reaper in March - come fasting to this visit  Call Jinny Blossom, pharmacist with any questions about your Repatha [phone]

## 2019-04-28 NOTE — Progress Notes (Signed)
Patient ID: Destiny Beasley                 DOB: Mar 08, 1956                    MRN: [id number]     HPI: Destiny Beasley is a 64 y.o. female patient referred to lipid clinic by Richardson Dopp, PA. PMH is significant for CAD s/p STEMI in 06/2017 with DES to pLCx, HFrEF with LVEF 40-45% 08/2018, DM, TIA, OSA, HTN, HLD, and obesity. Repatha prior authorization previously approved through 11/21/19. At follow up visit with Nicki Reaper last week, pt stated she experienced injection site pain with Repatha and stopped therapy. She was started on Zetia 10mg  daily on 12/31.  Pt presents today in good spirits. She is not taking pravastatin and never picked up ezetimibe. States she did well on Repatha but there was an issue with her insurance not covering it. With her new plan this year, she wishes to resume therapy. She does have 2 injections left at home from when she previously took [place] (was off for a total of 6 months and started on pravastatin at that time). States injection site pain improved and she wishes to continue on Repatha monotherapy since it worked so well for her. She felt better on pravastatin than other statins but still experienced some myalgias. Experienced worse myalgias on simvastatin, rosuvastatin, and atorvastatin, as well as dizziness. Eating less now - recently started on Rybelsus for DM which has curbed her appetite. She has also started using her treadmill at home for 30-45 minutes every morning.  Current Medications: pravastatin 20mg  daily, ezetimibe 10mg  daily Intolerances: simvastatin 20mg  daily, rosuvastatin 20mg  daily, rosuvastatin 5mg  daily, atorvastatin 80mg  daily - muscle aches and dizziness with all Risk Factors: CAD s/p STEMI, HFrEF, DM, TIA, HTN, obesity LDL goal: 55mg /dL  Diet: Eating less since starting on Rybelsus for DM. Likes salad, soup, burgers, eating more at restaurants lately.  Exercise: Just started back with exercise - 30-45 minutes on treadmill every morning before work.   Family History: Diabetes in her mother; Hyperlipidemia in her mother; Hypertension in her mother.  Social History: Denies tobacco use, illicit drug use, occasional alcohol use.  Labs: 04/23/19: TC 223, TG 138, HDL 57, LDL 141 (pravastatin 20mg  daily)  Past Medical History:  Diagnosis Date  . Anemia   . Anxiety   . CAD in native artery    a. Inf STEMI 123XX123 complicated by cardiogenic shock/complete heart block s/p emergent cath showing culprit large dominant LCx s/p aspiration thrombectomy and DES, EF 50% by cath, 40-45% by echo.  . Chronic systolic heart failure ([place]) 06/25/2017   Echo 11/19: mild LVH, EF 40-45, inf-lat HK, Gr 1 DD, trivial MR  . Complete heart block, transient ([place])    a. 06/2017 in setting of acute inf MI -> resolved after PCI.  . Diabetes mellitus   . Goiter   . History of radiation therapy 06/14/2016 - 07/19/2016   Right Breast 50 Gy 25 fractions  . Hypercholesteremia   . Hypertension   . Ischemic cardiomyopathy    a. EF 50% by cath and 40-45% by echo 06/2017.  . Malignant neoplasm of upper-outer quadrant of right female breast ([place]) 02/24/2016  . Meningioma ([place])   . Nuclear stress test    Nuclear stress test 10/19: EF 35, inferolateral, apical scar; inferior, apical inferior infarct with mild peri-infarct ischemia; high risk  . Obesity   . Personal history of chemotherapy   .  Personal history of radiation therapy   . Pituitary tumor   . Seasonal asthma   . Sleep apnea    does not use every night  . Stroke [facility])    TIA - Dec 1 st, 2017    Current Outpatient Medications on File Prior to Visit  Medication Sig Dispense Refill  . amLODipine (NORVASC) 10 MG tablet Take 1 tablet (10 mg total) by mouth daily. 90 tablet 3  . aspirin EC 81 MG tablet Take 1 tablet (81 mg total) by mouth daily. 90 tablet 3  . carvedilol (COREG) 12.5 MG tablet Take 1 tablet (12.5 mg total) by mouth 2 (two) times daily. 180 tablet 3  . Cholecalciferol (VITAMIN D-3 PO) Take 1  tablet by mouth daily.     . Continuous Blood Gluc Transmit (DEXCOM G6 TRANSMITTER) MISC 1 each by Does not apply route daily. 3 each 3  . ezetimibe (ZETIA) 10 MG tablet Take 1 tablet (10 mg total) by mouth daily. 90 tablet 3  . furosemide (LASIX) 20 MG tablet Take 1 tablet (20 mg total) by mouth daily. 90 tablet 2  . HUMALOG KWIKPEN 200 UNIT/ML SOPN INJECT 80 UNITS INTO THE PUMP ONCE DAILY 10 pen 3  . irbesartan (AVAPRO) 300 MG tablet Take 300 mg by mouth daily.    . MULTIPLE VITAMIN PO Take 1 tablet daily by mouth.    . pravastatin (PRAVACHOL) 20 MG tablet Take 1 tablet by mouth once a week. On Friday    . Venlafaxine HCl 150 MG TB24 Take 150 mg by mouth daily with breakfast.      No current facility-administered medications on file prior to visit.    Allergies  Allergen Reactions  . Hydrocodone Other (See Comments)    Severe anxiety Confusion   . Invokana [Canagliflozin] Other (See Comments)    Constant yeast infections  . Imdur [Isosorbide Nitrate] Other (See Comments)    Patient reported wheezing  . Atorvastatin Other (See Comments)    DIZZINESS    Assessment/Plan:  1. Hyperlipidemia - LDL above goal < 55mg /dL. Will resume Repatha 140mg  Q2W, prior auth approved through 11/22/19, $5 copay card activated. Previously intolerant to 4 statins, never started Zetia. Likely will not need this with resumption of Repatha. Encouraged pt to decrease intake of saturated fats and to keep up her excellent exercise regimen. Will have lipids rechecked when pt sees Richardson Dopp in March. Pt aware to contact clinic with any issues.  Megan E. Supple, PharmD, BCACP, [place] [id number] [place], [place] [address] Phone: [phone]; Fax: [phone] 04/28/2019 3:22 PM

## 2019-05-16 ENCOUNTER — Other Ambulatory Visit: Payer: Self-pay | Admitting: Pharmacist

## 2019-05-16 ENCOUNTER — Other Ambulatory Visit: Payer: Self-pay

## 2019-05-16 ENCOUNTER — Encounter (HOSPITAL_COMMUNITY): Payer: Self-pay | Admitting: Emergency Medicine

## 2019-05-16 ENCOUNTER — Emergency Department (HOSPITAL_COMMUNITY)
Admission: EM | Admit: 2019-05-16 | Discharge: 2019-05-16 | Disposition: A | Discharge status: Home / Self Care | Payer: 59 | Attending: Emergency Medicine | Admitting: Emergency Medicine

## 2019-05-16 ENCOUNTER — Other Ambulatory Visit: Payer: Self-pay | Admitting: Endocrinology

## 2019-05-16 ENCOUNTER — Emergency Department (HOSPITAL_COMMUNITY): Payer: 59

## 2019-05-16 DIAGNOSIS — Z79899 Other long term (current) drug therapy: Secondary | ICD-10-CM | POA: Insufficient documentation

## 2019-05-16 DIAGNOSIS — E119 Type 2 diabetes mellitus without complications: Secondary | ICD-10-CM | POA: Insufficient documentation

## 2019-05-16 DIAGNOSIS — Z794 Long term (current) use of insulin: Secondary | ICD-10-CM | POA: Diagnosis not present

## 2019-05-16 DIAGNOSIS — R0789 Other chest pain: Secondary | ICD-10-CM | POA: Diagnosis not present

## 2019-05-16 DIAGNOSIS — I5022 Chronic systolic (congestive) heart failure: Secondary | ICD-10-CM | POA: Insufficient documentation

## 2019-05-16 DIAGNOSIS — I110 Hypertensive heart disease with heart failure: Secondary | ICD-10-CM | POA: Diagnosis not present

## 2019-05-16 DIAGNOSIS — I10 Essential (primary) hypertension: Secondary | ICD-10-CM | POA: Diagnosis not present

## 2019-05-16 DIAGNOSIS — Z8673 Personal history of transient ischemic attack (TIA), and cerebral infarction without residual deficits: Secondary | ICD-10-CM | POA: Insufficient documentation

## 2019-05-16 DIAGNOSIS — Z7982 Long term (current) use of aspirin: Secondary | ICD-10-CM | POA: Diagnosis not present

## 2019-05-16 DIAGNOSIS — I2510 Atherosclerotic heart disease of native coronary artery without angina pectoris: Secondary | ICD-10-CM | POA: Insufficient documentation

## 2019-05-16 LAB — CBC
HCT: 41.5 % (ref 36.0–46.0)
Hemoglobin: 13 g/dL (ref 12.0–15.0)
MCH: 30 pg (ref 26.0–34.0)
MCHC: 31.3 g/dL (ref 30.0–36.0)
MCV: 95.6 fL (ref 80.0–100.0)
Platelets: 307 10*3/uL (ref 150–400)
RBC: 4.34 MIL/uL (ref 3.87–5.11)
RDW: 13.9 % (ref 11.5–15.5)
WBC: 7.1 10*3/uL (ref 4.0–10.5)
nRBC: 0 % (ref 0.0–0.2)

## 2019-05-16 LAB — BASIC METABOLIC PANEL
Anion gap: 9 (ref 5–15)
BUN: 16 mg/dL (ref 8–23)
CO2: 25 mmol/L (ref 22–32)
Calcium: 9 mg/dL (ref 8.9–10.3)
Chloride: 104 mmol/L (ref 98–111)
Creatinine, Ser: 0.96 mg/dL (ref 0.44–1.00)
GFR calc Af Amer: >60 mL/min (ref >60)
GFR calc non Af Amer: >60 mL/min (ref >60)
Glucose, Bld: 291 mg/dL — ABNORMAL HIGH (ref 70–99)
Potassium: 4.3 mmol/L (ref 3.5–5.1)
Sodium: 138 mmol/L (ref 135–145)

## 2019-05-16 LAB — PROTIME-INR
INR: 0.9 (ref 0.8–1.2)
Prothrombin Time: 12.4 seconds (ref 11.4–15.2)

## 2019-05-16 LAB — TROPONIN I (HIGH SENSITIVITY)
Troponin I (High Sensitivity): 3 ng/L (ref <18)
Troponin I (High Sensitivity): <2 ng/L (ref <18)

## 2019-05-16 MED ORDER — SODIUM CHLORIDE 0.9% FLUSH: 3.0000 mL | Freq: Once | INTRAVENOUS | Status: DC

## 2019-05-16 NOTE — Discharge Instructions (Addendum)
Follow-up with your cardiologist in the next few days, and return to the ER if you develop worsening pain, high fever, difficulty breathing, or other new and concerning symptoms.

## 2019-05-16 NOTE — ED Triage Notes (Signed)
Patient arrived with EMS from home reports chest tightness/SOB and  lightheaded onset this morning , no emesis , mild diaphoresis , history of CAD/Coronary stents , her cardiologist is Dr. Burt Knack , CBG= 200. Denies cough or fever .

## 2019-05-16 NOTE — ED Provider Notes (Signed)
[facility] EMERGENCY DEPARTMENT Provider Note   CSN: [id number] Arrival date & time: 05/16/19  0353     History Chief Complaint  Patient presents with  . Chest Tightness/SOB    Destiny Beasley is a 64 y.o. female.  Patient is a 64 year old female with history of coronary artery disease that is post STEMI in March 2019 requiring stent.  She also has history of CHF, diabetes, hypertension, ischemic cardiomyopathy.  She presents today for evaluation of chest tightness no shortness of breath, and feeling lightheaded.  This woke her up from sleep at approximately 2 AM.  She was brought here by EMS for evaluation of the symptoms.  She denies any recent exertional symptoms.  Patient does report being started on 2 new medications recently, Repatha for hyperlipidemia and Rybelsus for diabetes.  She wonders if possibly 1 of these medications is responsible for tonight's episodes.  Her cardiologist is Dr. Burt Knack.  The history is provided by the patient.       Past Medical History:  Diagnosis Date  . Anemia   . Anxiety   . CAD in native artery    a. Inf STEMI 123XX123 complicated by cardiogenic shock/complete heart block s/p emergent cath showing culprit large dominant LCx s/p aspiration thrombectomy and DES, EF 50% by cath, 40-45% by echo.  . Chronic systolic heart failure ([place]) 06/25/2017   Echo 11/19: mild LVH, EF 40-45, inf-lat HK, Gr 1 DD, trivial MR  . Complete heart block, transient ([place])    a. 06/2017 in setting of acute inf MI -> resolved after PCI.  . Diabetes mellitus   . Goiter   . History of radiation therapy 06/14/2016 - 07/19/2016   Right Breast 50 Gy 25 fractions  . Hypercholesteremia   . Hypertension   . Ischemic cardiomyopathy    a. EF 50% by cath and 40-45% by echo 06/2017.  . Malignant neoplasm of upper-outer quadrant of right female breast ([place]) 02/24/2016  . Meningioma ([place])   . Nuclear stress test    Nuclear stress test 10/19: EF 35, inferolateral,  apical scar; inferior, apical inferior infarct with mild peri-infarct ischemia; high risk  . Obesity   . Personal history of chemotherapy   . Personal history of radiation therapy   . Pituitary tumor   . Seasonal asthma   . Sleep apnea    does not use every night  . Stroke [facility])    TIA - Dec 1 st, 2017    Patient Active Problem List   Diagnosis Date Noted  . History of TIA (transient ischemic attack) 08/08/2017  . Malignant neoplasm of breast, stage 4 ([place]) 08/08/2017  . History of complete heart block in setting of STEMI 06/2017 06/29/2017  . OSA on CPAP 06/29/2017  . CAD (coronary artery disease) 06/29/2017  . Hx of Infero-Posterior ST Elevation MI in 06/2017 06/25/2017  . History of cardiogenic shock in setting of STEMI in 06/2017 06/25/2017  . Chronic systolic heart failure ([place]) 06/25/2017  . Malignant neoplasm of upper-outer quadrant of right female breast ([place]) 02/24/2016  . Type II diabetes mellitus, uncontrolled ([place]) 02/11/2014  . Essential hypertension 09/07/2013  . Atypical chest pain 11/11/2011  . Anxiety 11/11/2011  . Insulin dependent diabetes mellitus 11/11/2011  . Hypercholesterolemia 11/11/2011    Past Surgical History:  Procedure Laterality Date  . ABDOMINAL HYSTERECTOMY    . BRAIN MENINGIOMA EXCISION  2005  . BREAST BIOPSY    . BREAST LUMPECTOMY Right    2017  .  BREAST LUMPECTOMY WITH RADIOACTIVE SEED LOCALIZATION Right 04/20/2016   Procedure: RIGHT BREAST LUMPECTOMY WITH RADIOACTIVE SEED LOCALIZATION;  Surgeon: Fanny Skates, MD;  Location: [place];  Service: General;  Laterality: Right;  . BREAST REDUCTION SURGERY Bilateral 10/23/2016   Procedure: BILATERAL BREAST REDUCTION WITH LIPOSUCTION ASSISTANCE;  Surgeon: Cristine Polio, MD;  Location: [place];  Service: Plastics;  Laterality: Bilateral;  . CESAREAN SECTION    . CORONARY STENT INTERVENTION N/A 06/25/2017   Procedure: CORONARY STENT INTERVENTION;  Surgeon:  Sherren Mocha, MD;  Location: [place] CV LAB;  Service: Cardiovascular;  Laterality: N/A;  . CORONARY/GRAFT ACUTE MI REVASCULARIZATION N/A 06/25/2017   Procedure: Coronary/Graft Acute MI Revascularization;  Surgeon: Sherren Mocha, MD;  Location: [place] CV LAB;  Service: Cardiovascular;  Laterality: N/A;  . FOOT SURGERY Bilateral 2016   hammer toe surgery  . LEFT HEART CATH AND CORONARY ANGIOGRAPHY N/A 06/25/2017   Procedure: LEFT HEART CATH AND CORONARY ANGIOGRAPHY;  Surgeon: Sherren Mocha, MD;  Location: [place] [facility];  Service: Cardiovascular;  Laterality: N/A;  . MENISCUS REPAIR Left 2015  . PITUITARY SURGERY    . REDUCTION MAMMAPLASTY    . RIGHT HEART CATH N/A 06/25/2017   Procedure: RIGHT HEART CATH;  Surgeon: Sherren Mocha, MD;  Location: [place] CV LAB;  Service: Cardiovascular;  Laterality: N/A;  . THYROID SURGERY       OB History    Gravida  4   Para      Term      Preterm      AB      Living  2     SAB      TAB      Ectopic      Multiple      Live Births              Family History  Problem Relation Age of Onset  . Diabetes Mother   . Hyperlipidemia Mother   . Hypertension Mother     Social History   Tobacco Use  . Smoking status: Never Smoker  . Smokeless tobacco: Never Used  Substance Use Topics  . Alcohol use: Yes    Alcohol/week: 1.0 standard drinks    Types: 1 Glasses of wine per week    Comment: 1 glass monthly  . Drug use: No    Home Medications Prior to Admission medications   Medication Sig Start Date End Date Taking? Authorizing Provider  amLODipine (NORVASC) 10 MG tablet Take 1 tablet (10 mg total) by mouth daily. 04/22/19 07/21/19  Richardson Dopp T, PA-C  aspirin EC 81 MG tablet Take 1 tablet (81 mg total) by mouth daily. 01/01/19   Richardson Dopp T, PA-C  carvedilol (COREG) 12.5 MG tablet Take 1 tablet (12.5 mg total) by mouth 2 (two) times daily. 01/01/19 04/01/19  Richardson Dopp T, PA-C  Cholecalciferol (VITAMIN  D-3 PO) Take 1 tablet by mouth daily.     [provider]  Continuous Blood Gluc Transmit (DEXCOM G6 TRANSMITTER) MISC 1 each by Does not apply route daily. 03/27/18   Elayne Snare, MD  Evolocumab (REPATHA SURECLICK) [id number] MG/ML SOAJ Inject 1 pen into the skin every 14 (fourteen) days. 04/28/19   Richardson Dopp T, PA-C  furosemide (LASIX) 20 MG tablet Take 1 tablet (20 mg total) by mouth daily. 03/31/19   Sherren Mocha, MD  HUMALOG KWIKPEN 200 UNIT/ML SOPN INJECT 80 UNITS INTO THE PUMP ONCE DAILY 11/12/18   Elayne Snare, MD  irbesartan (AVAPRO) 300 MG tablet Take 300 mg by mouth daily.    [provider]  MULTIPLE VITAMIN PO Take 1 tablet daily by mouth.    [provider]  Semaglutide (RYBELSUS) 3 MG TABS Take 1 tablet by mouth daily.    [provider]  Venlafaxine HCl 150 MG TB24 Take 150 mg by mouth daily with breakfast.     [provider]    Allergies    Hydrocodone, Invokana [canagliflozin], Imdur [isosorbide nitrate], Rosuvastatin, Simvastatin, and Atorvastatin  Review of Systems   Review of Systems  All other systems reviewed and are negative.   Physical Exam Updated Vital Signs BP 137/72   Pulse 81   Temp 97.9 F (36.6 C) (Oral)   Resp 20   SpO2 99%   Physical Exam Vitals and nursing note reviewed.  Constitutional:      General: She is not in acute distress.    Appearance: She is well-developed. She is not diaphoretic.  HENT:     Head: Normocephalic and atraumatic.  Cardiovascular:     Rate and Rhythm: Normal rate and regular rhythm.     Heart sounds: No murmur. No friction rub. No gallop.   Pulmonary:     Effort: Pulmonary effort is normal. No respiratory distress.     Breath sounds: Normal breath sounds. No wheezing.  Abdominal:     General: Bowel sounds are normal. There is no distension.     Palpations: Abdomen is soft.     Tenderness: There is no abdominal tenderness.  Musculoskeletal:        General: No swelling or  tenderness. Normal range of motion.     Cervical back: Normal range of motion and neck supple.     Right lower leg: No edema.     Left lower leg: No edema.  Skin:    General: Skin is warm and dry.  Neurological:     General: No focal deficit present.     Mental Status: She is alert and oriented to person, place, and time.     Cranial Nerves: No cranial nerve deficit.     Sensory: No sensory deficit.     ED Results / Procedures / Treatments   Labs (all labs ordered are listed, but only abnormal results are displayed) Labs Reviewed  BASIC METABOLIC PANEL - Abnormal; Notable for the following components:      Result Value   Glucose, Bld 291 (*)    All other components within normal limits  CBC  PROTIME-INR  TROPONIN I (HIGH SENSITIVITY)    EKG EKG Interpretation  Date/Time:  Friday May 16 2019 04:02:39 EST Ventricular Rate:  87 PR Interval:  190 QRS Duration: 82 QT Interval:  364 QTC Calculation: 438 R Axis:   34 Text Interpretation: Normal sinus rhythm Inferior infarct , age undetermined Abnormal ECG No significant change since 09/04/2018 Confirmed by Veryl Speak [phone]) on 05/16/2019 5:11:56 AM   Radiology DG Chest 2 View  Result Date: 05/16/2019 CLINICAL DATA:  Chest tightness.  Hyperglycemia. EXAM: CHEST - 2 VIEW COMPARISON:  One-view chest x-ray 09/04/2018 FINDINGS: The heart size and mediastinal contours are within normal limits. Both lungs are clear. The visualized skeletal structures are unremarkable. IMPRESSION: Negative two view chest x-ray Electronically Signed   By: San Morelle M.D.   On: 05/16/2019 04:35    Procedures Procedures (including critical care time)  Medications Ordered in ED Medications  sodium chloride flush (NS) 0.9 % injection 3 mL (3 mLs  Intravenous Not Given 05/16/19 [id number])    ED Course  I have reviewed the triage vital signs and the nursing notes.  Pertinent labs & imaging results that were available during my care of the  patient were reviewed by me and considered in my medical decision making (see chart for details).    MDM Rules/Calculators/A&P  Patient presenting here with complaints of chest tightness and feeling lightheaded.  This began at approximately 2 AM.  Patient does have a history of prior MI with stent, however the symptoms are different.  Her work-up shows negative troponin x2 and unchanged EKG.  She is concerned this may be a side effect of her cholesterol and diabetic medication which was recently initiated.  At this point, I feel as though discharge is appropriate.  Patient to follow-up with cardiologist/primary doctor and return to the ER in the meantime if symptoms worsen or change.  Final Clinical Impression(s) / ED Diagnoses Final diagnoses:  None    Rx / DC Orders ED Discharge Orders    None       Veryl Speak, MD 05/16/19 [phone]

## 2019-06-03 NOTE — Progress Notes (Deleted)
Cardiology Office Note:    Date:  06/03/2019   ID:  Destiny Beasley, DOB 17-Dec-1955, MRN [id number]  PCP:  Lawerance Cruel, MD  Cardiologist:  Sherren Mocha, MD *** Electrophysiologist:  None   Referring MD: Lawerance Cruel, MD   Chief Complaint:  No chief complaint on file.    Patient Profile:    Destiny Beasley is a 64 y.o. female with:   Coronary artery disease  ? S/p inf-lat STEMI in 06/2017 c/b CGS, acute HFrEF, CHB >>PCI: DES to pLCx ? Myoview 10/19: inferior scar, mild peri-infarct ischemia  Chronic Systolic CHF ? Ischemic CM ? Echocardiogram 3/19: EF 40-45 ? Echocardiogram 11/19: EF 40-45 ? Echocardiogram 08/2018: EF 40-45  Diabetes mellitus   Prior TIA  OSA  Breast CA   Hypertension   Hyperlipidemia  Prior CV studies: Echocardiogram 09/05/2018 EF 40-45, inf-lat AK, mild LVH, Gr 1 DD, normal RVSF  Echo 03/07/18 Mild LVH, EF 40-45, GLS -11%, inf-lat HK, Gr 1 DD, trivial MR  Nuclear stress test10/22/19 EF 35, inferolateral, apical scar; inferior, apical inferior infarct with mild peri-infarct ischemia; high risk  Echo 06/26/17 EF 40-45, inferolateral hypokinesis, mild MR  Cardiac catheterization 06/25/17 LM luminal irregularities LAD luminal irregularities, proximal 30 LCx proximal 100 RCA irregularities EF 45-50, inferolateral akinesis PCI: 4 x 20 mm Synergy DES to the proximal LCx  Echo 03/25/16 Moderate LVH, EF 60-65, normal wall motion  Carotid US 12/17 Bilateral ICA 1-39  Nuclear stress test 7/13 1. Negative for pharmacologic-stress induced ischemia. 2. Left ventricular ejection fraction 65%.  History of Present Illness:    Destiny Beasley was last seen in Dec 2020.  She was seen in the ED 05/16/2019 with chest pain. hs-Troponin was neg x 2.    The DICTATELATER SmartLink is not supported in this context. ***   Past Medical History:  Diagnosis Date  . Anemia   . Anxiety   . CAD in native artery    a. Inf STEMI 123XX123  complicated by cardiogenic shock/complete heart block s/p emergent cath showing culprit large dominant LCx s/p aspiration thrombectomy and DES, EF 50% by cath, 40-45% by echo.  . Chronic systolic heart failure ([place]) 06/25/2017   Echo 11/19: mild LVH, EF 40-45, inf-lat HK, Gr 1 DD, trivial MR  . Complete heart block, transient ([place])    a. 06/2017 in setting of acute inf MI -> resolved after PCI.  . Diabetes mellitus   . Goiter   . History of radiation therapy 06/14/2016 - 07/19/2016   Right Breast 50 Gy 25 fractions  . Hypercholesteremia   . Hypertension   . Ischemic cardiomyopathy    a. EF 50% by cath and 40-45% by echo 06/2017.  . Malignant neoplasm of upper-outer quadrant of right female breast ([place]) 02/24/2016  . Meningioma ([place])   . Nuclear stress test    Nuclear stress test 10/19: EF 35, inferolateral, apical scar; inferior, apical inferior infarct with mild peri-infarct ischemia; high risk  . Obesity   . Personal history of chemotherapy   . Personal history of radiation therapy   . Pituitary tumor   . Seasonal asthma   . Sleep apnea    does not use every night  . Stroke [facility])    TIA - Dec 1 st, 2017    Current Medications: No outpatient medications have been marked as taking for the 06/04/19 encounter (Appointment) with Richardson Dopp T, PA-C.     Allergies:   Hydrocodone, Invokana [canagliflozin], Imdur [isosorbide nitrate],  Repatha [evolocumab], Rosuvastatin, Simvastatin, and Atorvastatin   Social History   Tobacco Use  . Smoking status: Never Smoker  . Smokeless tobacco: Never Used  Substance Use Topics  . Alcohol use: Yes    Alcohol/week: 1.0 standard drinks    Types: 1 Glasses of wine per week    Comment: 1 glass monthly  . Drug use: No     Family Hx: The patient's family history includes Diabetes in her mother; Hyperlipidemia in her mother; Hypertension in her mother.  ROS   EKGs/Labs/Other Test Reviewed:    EKG:  EKG is *** ordered today.  The ekg ordered  today demonstrates ***  Recent Labs: 06/11/2018: ALT 19 09/05/2018: Magnesium 1.8 05/16/2019: BUN 16; Creatinine, Ser 0.96; Hemoglobin 13.0; Platelets 307; Potassium 4.3; Sodium 138   Recent Lipid Panel Lab Results  Component Value Date/Time   CHOL 223 (H) 04/23/2019 07:14 AM   TRIG 138 04/23/2019 07:14 AM   HDL 57 04/23/2019 07:14 AM   CHOLHDL 3.9 04/23/2019 07:14 AM   CHOLHDL 2 08/12/2018 09:44 AM   LDLCALC 141 (H) 04/23/2019 07:14 AM    Physical Exam:    VS:  There were no vitals taken for this visit.    Wt Readings from Last 3 Encounters:  04/22/19 262 lb (118.8 kg)  04/03/19 259 lb (117.5 kg)  01/01/19 259 lb (117.5 kg)     Physical Exam ***  ASSESSMENT & PLAN:    *** 1. Essential hypertension BP improved but remains above target.  She has a hx of intolerance to nitrates, spironolactone.  She also notes symptoms of wheezing with the Carvedilol.  Lungs are clear on exam today.  She does note that her diet is probably rich in salt.  We discussed limiting salt.  I will increase her amlodipine to 10 mg once daily.  FU via Telemedicine in 6 weeks.   2. Coronary artery disease involving native coronary artery of native heart with angina pectoris [facility]) History of posterior MI in March [id number] complicated by cardiogenic shock, complete heart block and systolic heart failure.She was treated with a drug-eluting stent to the LCx.  Myoview in 2019 was low risk.  She is not having angina.  Continue ASA, statin, beta-blocker, angiotensin receptor blocker.    3. Chronic systolic heart failure (HCC) EF 40-45 by Echocardiogram in 08/2018. NYHA 2.  Volume status stable.  Continue current dose of beta-blocker, angiotensin receptor blocker.  She is intol of spironolactone and nitrates.  She may also have some wheezing with carvedilol.  Therefore, I would not increase this further.  Consider adding Hydralazine if BP remains above target.    4. Hyperlipidemia  She is intol of statins.  She  had good success with PCSK9 inhibitors. But, she stopped this due to injection site reaction.  I will get a follow up Lipid panel and refer her back to the Lipid Clinic.  Question if she would be a good candidate for Bempedoic Acid.       Dispo:  No follow-ups on file.   Medication Adjustments/Labs and Tests Ordered: Current medicines are reviewed at length with the patient today.  Concerns regarding medicines are outlined above.  Tests Ordered: No orders of the defined types were placed in this encounter.  Medication Changes: No orders of the defined types were placed in this encounter.   Signed, Richardson Dopp, PA-C  06/03/2019 10:51 PM    [facility] [place], [place], [place]  [address] Phone: [phone])  [phone]; Fax: [phone]    

## 2019-06-04 ENCOUNTER — Other Ambulatory Visit (INDEPENDENT_AMBULATORY_CARE_PROVIDER_SITE_OTHER): Payer: 59

## 2019-06-04 ENCOUNTER — Other Ambulatory Visit: Payer: Self-pay

## 2019-06-04 ENCOUNTER — Other Ambulatory Visit: Payer: Self-pay | Admitting: Endocrinology

## 2019-06-04 ENCOUNTER — Encounter: Payer: 59 | Admitting: Physician Assistant

## 2019-06-04 ENCOUNTER — Telehealth: Payer: Self-pay | Admitting: *Deleted

## 2019-06-04 DIAGNOSIS — E1165 Type 2 diabetes mellitus with hyperglycemia: Secondary | ICD-10-CM

## 2019-06-04 DIAGNOSIS — Z794 Long term (current) use of insulin: Secondary | ICD-10-CM | POA: Diagnosis not present

## 2019-06-04 DIAGNOSIS — E7800 Pure hypercholesterolemia, unspecified: Secondary | ICD-10-CM

## 2019-06-04 LAB — HEMOGLOBIN A1C: Hgb A1c MFr Bld: 9.3 % — ABNORMAL HIGH (ref 4.6–6.5)

## 2019-06-04 LAB — LDL CHOLESTEROL, DIRECT: Direct LDL: 87 mg/dL

## 2019-06-04 LAB — GLUCOSE, RANDOM: Glucose, Bld: 114 mg/dL — ABNORMAL HIGH (ref 70–99)

## 2019-06-04 NOTE — Telephone Encounter (Signed)
Attemped to call patient for virtual appointment, left message to call office will try again before marking her a no show.

## 2019-06-04 NOTE — Progress Notes (Signed)
Date:  06/04/2019   ID:  AIN GUTKOWSKI, DOB [birth year], MRN [id number]     PCP:  Lawerance Cruel, MD  Cardiologist:  Sherren Mocha, MD  Electrophysiologist:  None   Evaluation Performed:    Chief Complaint:    History of Present Illness:    Destiny Beasley is a 64 y.o. female with   Coronary artery disease  ? S/p inf-lat STEMI in 06/2017 c/b CGS, acute HFrEF, CHB >>PCI: DES to pLCx ? Myoview 10/19: inferior scar, mild peri-infarct ischemia  Chronic Systolic CHF ? Ischemic CM ? Echocardiogram 3/19: EF 40-45 ? Echocardiogram 11/19: EF 40-45 ? Echocardiogram 08/2018: EF 40-45  Diabetes mellitus   Prior TIA  OSA  Breast CA   Hypertension   Hyperlipidemia  Ms. Etchison was last seen in Dec 2020.  She was seen in the ED 05/16/2019 with chest pain. hs-Troponin was neg x 2.        The patient  have symptoms concerning for COVID-19 infection (fever, chills, cough, or new shortness of breath).    Past Medical History:  Diagnosis Date  . Anemia   . Anxiety   . CAD in native artery    a. Inf STEMI 123XX123 complicated by cardiogenic shock/complete heart block s/p emergent cath showing culprit large dominant LCx s/p aspiration thrombectomy and DES, EF 50% by cath, 40-45% by echo.  . Chronic systolic heart failure ([place]) 06/25/2017   Echo 11/19: mild LVH, EF 40-45, inf-lat HK, Gr 1 DD, trivial MR  . Complete heart block, transient ([place])    a. 06/2017 in setting of acute inf MI -> resolved after PCI.  . Diabetes mellitus   . Goiter   . History of radiation therapy 06/14/2016 - 07/19/2016   Right Breast 50 Gy 25 fractions  . Hypercholesteremia   . Hypertension   . Ischemic cardiomyopathy    a. EF 50% by cath and 40-45% by echo 06/2017.  . Malignant neoplasm of upper-outer quadrant of right female breast ([place]) 02/24/2016  . Meningioma ([place])   . Nuclear stress test    Nuclear stress test 10/19: EF 35, inferolateral, apical scar; inferior, apical inferior infarct with  mild peri-infarct ischemia; high risk  . Obesity   . Personal history of chemotherapy   . Personal history of radiation therapy   . Pituitary tumor   . Seasonal asthma   . Sleep apnea    does not use every night  . Stroke [facility])    TIA - Dec 1 st, 2017   Past Surgical History:  Procedure Laterality Date  . ABDOMINAL HYSTERECTOMY    . BRAIN MENINGIOMA EXCISION  2005  . BREAST BIOPSY    . BREAST LUMPECTOMY Right    2017  . BREAST LUMPECTOMY WITH RADIOACTIVE SEED LOCALIZATION Right 04/20/2016   Procedure: RIGHT BREAST LUMPECTOMY WITH RADIOACTIVE SEED LOCALIZATION;  Surgeon: Fanny Skates, MD;  Location: [place];  Service: General;  Laterality: Right;  . BREAST REDUCTION SURGERY Bilateral 10/23/2016   Procedure: BILATERAL BREAST REDUCTION WITH LIPOSUCTION ASSISTANCE;  Surgeon: Cristine Polio, MD;  Location: [place];  Service: Plastics;  Laterality: Bilateral;  . CESAREAN SECTION    . CORONARY STENT INTERVENTION N/A 06/25/2017   Procedure: CORONARY STENT INTERVENTION;  Surgeon: Sherren Mocha, MD;  Location: [place] CV LAB;  Service: Cardiovascular;  Laterality: N/A;  . CORONARY/GRAFT ACUTE MI REVASCULARIZATION N/A 06/25/2017   Procedure: Coronary/Graft Acute MI Revascularization;  Surgeon: Sherren Mocha, MD;  Location: [place]  CV LAB;  Service: Cardiovascular;  Laterality: N/A;  . FOOT SURGERY Bilateral 2016   hammer toe surgery  . LEFT HEART CATH AND CORONARY ANGIOGRAPHY N/A 06/25/2017   Procedure: LEFT HEART CATH AND CORONARY ANGIOGRAPHY;  Surgeon: Sherren Mocha, MD;  Location: [place] CV LAB;  Service: Cardiovascular;  Laterality: N/A;  . MENISCUS REPAIR Left 2015  . PITUITARY SURGERY    . REDUCTION MAMMAPLASTY    . RIGHT HEART CATH N/A 06/25/2017   Procedure: RIGHT HEART CATH;  Surgeon: Sherren Mocha, MD;  Location: [place] CV LAB;  Service: Cardiovascular;  Laterality: N/A;  . THYROID SURGERY       No outpatient medications  have been marked as taking for the 06/04/19 encounter (Appointment) with Richardson Dopp T, PA-C.     Allergies:   Hydrocodone, Invokana [canagliflozin], Imdur [isosorbide nitrate], Repatha [evolocumab], Rosuvastatin, Simvastatin, and Atorvastatin   Social History   Tobacco Use  . Smoking status: Never Smoker  . Smokeless tobacco: Never Used  Substance Use Topics  . Alcohol use: Yes    Alcohol/week: 1.0 standard drinks    Types: 1 Glasses of wine per week    Comment: 1 glass monthly  . Drug use: No     Family Hx: The patient's family history includes Diabetes in her mother; Hyperlipidemia in her mother; Hypertension in her mother.  ROS:   Please see the history of present illness.     All other systems reviewed and are negative.   Prior CV studies:    Echocardiogram 09/05/2018 EF 40-45, inf-lat AK, mild LVH, Gr 1 DD, normal RVSF  Echo 03/07/18 Mild LVH, EF 40-45, GLS -11%, inf-lat HK, Gr 1 DD, trivial MR  Nuclear stress test10/22/19 EF 35, inferolateral, apical scar; inferior, apical inferior infarct with mild peri-infarct ischemia; high risk  Echo 06/26/17 EF 40-45, inferolateral hypokinesis, mild MR  Cardiac catheterization 06/25/17 LM luminal irregularities LAD luminal irregularities, proximal 30 LCx proximal 100 RCA irregularities EF 45-50, inferolateral akinesis PCI: 4 x 20 mm Synergy DES to the proximal LCx  Echo 03/25/16 Moderate LVH, EF 60-65, normal wall motion  Carotid US 12/17 Bilateral ICA 1-39  Nuclear stress test 7/13 1. Negative for pharmacologic-stress induced ischemia 2. Left ventricular ejection fraction 65%.   Labs/Other Tests and Data Reviewed:    EKG:    Recent Labs: 06/11/2018: ALT 19 09/05/2018: Magnesium 1.8 05/16/2019: BUN 16; Creatinine, Ser 0.96; Hemoglobin 13.0; Platelets 307; Potassium 4.3; Sodium 138   Recent Lipid Panel Lab Results  Component Value Date/Time   CHOL 223 (H) 04/23/2019 07:14 AM   TRIG 138 04/23/2019  07:14 AM   HDL 57 04/23/2019 07:14 AM   CHOLHDL 3.9 04/23/2019 07:14 AM   CHOLHDL 2 08/12/2018 09:44 AM   LDLCALC 141 (H) 04/23/2019 07:14 AM    Wt Readings from Last 3 Encounters:  04/22/19 262 lb (118.8 kg)  04/03/19 259 lb (117.5 kg)  01/01/19 259 lb (117.5 kg)     Objective:    Vital Signs:  There were no vitals taken for this visit.     ASSESSMENT & PLAN:     1. Essential hypertension BP improved but remains above target. She has a hx of intolerance to nitrates, spironolactone. She also notes symptoms of wheezing with the Carvedilol. Lungs are clear on exam today. She does note that her diet is probably rich in salt. We discussed limiting salt. I will increase her amlodipine to 10 mg once daily. FU via Telemedicine in 6 weeks.  2. Coronary artery disease involving native coronary artery of native heart with angina pectoris [facility]) History of posterior MI in March [id number] complicated by cardiogenic shock, complete heart block and systolic heart failure.She was treated with a drug-eluting stent to the LCx.Myoview in 2019 was low risk. She is not having angina. Continue ASA, statin, beta-blocker,angiotensin receptor blocker.  3. Chronic systolic heart failure (HCC) EF 40-45 by Echocardiogram in 08/2018. NYHA 2. Volume status stable. Continue current dose of beta-blocker,angiotensin receptor blocker. She is intol of spironolactone and nitrates. She may also have some wheezing with carvedilol. Therefore, I would not increase this further. Consider adding Hydralazine if BP remains above target.   4. Hyperlipidemia  She is intol of statins. She had good success with PCSK9 inhibitors. But, she stopped this due to injection site reaction. I will get a follow upLipid panel and refer her back to the Lipid Clinic. Question if she would be a good candidate for Bempedoic Acid.     COVID-19 Education: The signs and symptoms of COVID-19 were discussed with the  patient and how to seek care for testing (follow up with PCP or arrange E-visit).  The importance of social distancing was discussed today.  Time:   Today, I have spent  minutes with the patient with telehealth technology discussing the above problems.     Medication Adjustments/Labs and Tests Ordered: Current medicines are reviewed at length with the patient today.  Concerns regarding medicines are outlined above.   Tests Ordered: No orders of the defined types were placed in this encounter.   Medication Changes: No orders of the defined types were placed in this encounter.   Follow Up:     SignedRichardson Dopp, PA-C  06/04/2019 8:08 AM    [facility] This encounter was created in error - please disregard.

## 2019-06-05 LAB — FRUCTOSAMINE: Fructosamine: 342 umol/L — ABNORMAL HIGH (ref 0–285)

## 2019-06-06 ENCOUNTER — Ambulatory Visit: Payer: 59 | Admitting: Endocrinology

## 2019-06-06 ENCOUNTER — Other Ambulatory Visit: Payer: Self-pay

## 2019-06-06 ENCOUNTER — Encounter: Payer: Self-pay | Admitting: Endocrinology

## 2019-06-06 VITALS — BP 140/82 | HR 92 | Ht 72.0 in | Wt 266.0 lb

## 2019-06-06 DIAGNOSIS — Z794 Long term (current) use of insulin: Secondary | ICD-10-CM

## 2019-06-06 DIAGNOSIS — I10 Essential (primary) hypertension: Secondary | ICD-10-CM | POA: Diagnosis not present

## 2019-06-06 DIAGNOSIS — E1165 Type 2 diabetes mellitus with hyperglycemia: Secondary | ICD-10-CM | POA: Diagnosis not present

## 2019-06-06 MED ORDER — FLUCONAZOLE 150 MG PO TABS: 150.0000 mg | ORAL_TABLET | Freq: Once | ORAL | 1 refills | Status: AC

## 2019-06-06 MED ORDER — TOUJEO SOLOSTAR 300 UNIT/ML ~~LOC~~ SOPN: 55.0000 [IU] | PEN_INJECTOR | Freq: Every day | SUBCUTANEOUS | 2 refills | Status: DC

## 2019-06-06 MED ORDER — RYBELSUS 14 MG PO TABS: 1.0000 | ORAL_TABLET | Freq: Every day | ORAL | 2 refills | Status: DC

## 2019-06-06 MED ORDER — JARDIANCE 10 MG PO TABS: 10.0000 mg | ORAL_TABLET | Freq: Every day | ORAL | 3 refills | Status: DC

## 2019-06-06 NOTE — Progress Notes (Signed)
Patient ID: Destiny Beasley, female   DOB: 26-Mar-1956, 64 y.o.   MRN: [id number]    Reason for Appointment: Type II Diabetes follow-up   History of Present Illness    Date of diagnosis: 10/2008  Previous history: She had markedly increased blood sugars at diagnosis and was tried on oral hypoglycemic drugs and Victoza for about 9 months before starting an insulin.  Since 2011 she had been on basal bolus insulin regimen Previously had had difficulty controlling her diabetes mostly because of difficulty with compliance with various aspects of self-care.  Previous regimen: Toujeo 60 units at 8 am . Apidra 20 units at times  She was started on the Omnipod insulin pump on 09/11/16  Recent history:   Insulin regimen: HUMALOG U-100, 20 to 60 units 3 times a day Lantus previously 55 units daily  Non-insulin hypoglycemic drugs: Rybelsus 7 mg daily  Her A1c has been consistently high, now 9.3 compared to 10.3         Current management, blood sugar patterns and problems:  She is now using the Dexcom sensor again since late last year and is able to afford it now but not the insulin pump  She asked her PCP to change her Victoza to Rybelsus about 6 weeks ago  She thinks that with starting 7 mg about 10 days ago she has somewhat better satiety  However her postprandial readings overall were still higher in the late part of January compared to the last 2 weeks on her sensor  She ran out of Lantus at least 2 weeks ago and her blood sugars overnight are the highest recently and also last month  She was taking Lantus in the mornings for fear of hypoglycemia overnight  With not having Lantus she has increased her Humalog to up to 60 units at a time  However she now says that she is taking her HUMALOG after eating when the blood sugar goes up  She says she has a lot of snacks late in the evening and does not control her carbohydrates  EXERCISE has been only 1 or 2 days a week on her  treadmill  Weight is slightly higher than last visit  Side effects from medications: Candidiasis from [place]    Dates of Recording: 1/30 through 2/12  Sensor description: Dexcom G6  Results statistics for last 2 weeks worth of rise weekly:   CGM use % of time  69  Average and SD  199, +/-49  Time in range  31     %  % Time Above 180  69, previous 52  % Time above 250  13, previous 16  % Time Below target  0.1    Glycemic patterns summary: Did not have her sensor active on  every day on the first week but more in the last week of the recording Hyperglycemia is consistently present overnight and variably the rest of the day but highest overall average around 8 PM or 4 AM Lowest blood sugar on an average around 2 PM   Hyperglycemic episodes setting primarily overnight with frequent blood sugar spikes in the evenings and afternoons mostly with pain 5 PM-9 PM  Hypoglycemic episodes not present  Overnight periods: Blood sugars are fairly consistently high through the night and rising until 6 AM.  Average blood sugar in the night at least 200  Preprandial periods: Blood sugars are around 200 before breakfast, around 180 at lunch  and dinner  Postprandial periods:   Since her mealtimes and snacks are variable blood sugars are not consistently, has mild hypoglycemia at times after breakfast except for once Hyperglycemia after 4 PM occurs fairly frequently lasting in to early part of the night   PREVIOUS data:  PRE-MEAL Fasting Lunch Dinner Bedtime Overall  Glucose range:  210-370  265, 348  265-346    Mean/median:  282     304   POST-MEAL PC Breakfast PC Lunch PC Dinner  Glucose range:   440, 426  245-362  Mean/median:         Dietician visit: Most recent: 04/2010 Weight control:  Wt Readings from Last 3 Encounters:  06/06/19 266 lb (120.7 kg)  04/22/19 262 lb (118.8 kg)  04/03/19 259 lb (117.5 kg)          Diabetes labs:  Lab Results  Component Value Date   HGBA1C 9.3 (H) 06/04/2019   HGBA1C 10.3 (H) 01/17/2019   HGBA1C 8.4 (H) 09/04/2018   Lab Results  Component Value Date   MICROALBUR 0.9 06/11/2018   LDLCALC 141 (H) 04/23/2019   CREATININE 0.96 05/16/2019      No visits with results within 1 Day(s) from this visit.  Latest known visit with results is:  Lab on 06/04/2019  Component Date Value Ref Range Status  . Fructosamine 06/04/2019 342* 0 - 285 umol/L Final   Comment: Published reference interval for apparently healthy subjects between age 24 and 28 is 18 - 285 umol/L and in a poorly controlled diabetic population is 228 - 563 umol/L with a mean of 396 umol/L.   Mar[place] Kitchen Direct LDL 06/04/2019 87.0  mg/dL Final   Optimal:  <100 mg/dLNear or Above Optimal:  100-129 mg/dLBorderline High:  130-159 mg/dLHigh:  160-189 mg/dLVery High:  >190 mg/dL  . Glucose, Bld 06/04/2019 114* 70 - 99 mg/dL Final  . Hgb A1c MFr Bld 06/04/2019 9.3* 4.6 - 6.5 % Final   Glycemic Control Guidelines for People with Diabetes:Non Diabetic:  <6%Goal of Therapy: <7%Additional Action Suggested:  >8%      Allergies as of 06/06/2019      Reactions   Hydrocodone Other (See Comments)   Severe anxiety Confusion   Invokana [canagliflozin] Other (See Comments)   Constant yeast infections   Imdur [isosorbide Nitrate] Other (See Comments)   Patient reported wheezing   Repatha [evolocumab]    Muscle aches   Rosuvastatin    myalgias   Simvastatin    myalgias   Atorvastatin Other (See Comments)   DIZZINESS      Medication List       Accurate as of June 06, 2019  2:43 PM. If you have any questions, ask your nurse or doctor.        STOP taking these medications   insulin glargine 100 UNIT/ML injection Commonly known as: LANTUS Replaced by: Toujeo SoloStar 300 UNIT/ML Sopn Stopped by: Elayne Snare, MD   Victoza 18 MG/3ML Sopn Generic drug: liraglutide Stopped by: Elayne Snare, MD     TAKE  these medications   amLODipine 10 MG tablet Commonly known as: NORVASC Take 1 tablet (10 mg total) by mouth daily.   aspirin EC 81 MG tablet Take 1 tablet (81 mg total) by mouth daily.   carvedilol 12.5 MG tablet Commonly known as: COREG Take 1 tablet (12.5 mg total) by mouth 2 (two) times daily.   Dexcom G6 Transmitter Misc 1 each by Does not apply route daily.   furosemide 20 MG  tablet Commonly known as: LASIX Take 1 tablet (20 mg total) by mouth daily.   HumaLOG KwikPen 100 UNIT/ML KwikPen Generic drug: insulin lispro Inject 20 Units into the skin in the morning and at bedtime. Inject 20 units under the skin before lunch and dinner. What changed: Another medication with the same name was removed. Continue taking this medication, and follow the directions you see here. Changed by: Elayne Snare, MD   Jardiance 10 MG Tabs tablet Generic drug: empagliflozin Take 10 mg by mouth daily with breakfast. Started by: Elayne Snare, MD   MULTIPLE VITAMIN PO Take 1 tablet daily by mouth.   Rybelsus 14 MG Tabs Generic drug: Semaglutide Take 1 tablet by mouth daily before breakfast. What changed:   medication strength  how much to take  when to take this  Another medication with the same name was removed. Continue taking this medication, and follow the directions you see here. Changed by: Elayne Snare, MD   Toujeo SoloStar 300 UNIT/ML Sopn Generic drug: Insulin Glargine (1 Unit Dial) Inject 55 Units into the skin daily. Replaces: insulin glargine 100 UNIT/ML injection Started by: Elayne Snare, MD   Venlafaxine HCl 150 MG Tb24 Take 150 mg by mouth daily as needed (for nerves).   VITAMIN D-3 PO Take 1 tablet by mouth daily.       Allergies:  Allergies  Allergen Reactions  . Hydrocodone Other (See Comments)    Severe anxiety Confusion   . Invokana [Canagliflozin] Other (See Comments)    Constant yeast infections  . Imdur [Isosorbide Nitrate] Other (See Comments)     Patient reported wheezing  . Repatha [Evolocumab]     Muscle aches  . Rosuvastatin     myalgias  . Simvastatin     myalgias  . Atorvastatin Other (See Comments)    DIZZINESS    Past Medical History:  Diagnosis Date  . Anemia   . Anxiety   . CAD in native artery    a. Inf STEMI 123XX123 complicated by cardiogenic shock/complete heart block s/p emergent cath showing culprit large dominant LCx s/p aspiration thrombectomy and DES, EF 50% by cath, 40-45% by echo.  . Chronic systolic heart failure ([place]) 06/25/2017   Echo 11/19: mild LVH, EF 40-45, inf-lat HK, Gr 1 DD, trivial MR  . Complete heart block, transient ([place])    a. 06/2017 in setting of acute inf MI -> resolved after PCI.  . Diabetes mellitus   . Goiter   . History of radiation therapy 06/14/2016 - 07/19/2016   Right Breast 50 Gy 25 fractions  . Hypercholesteremia   . Hypertension   . Ischemic cardiomyopathy    a. EF 50% by cath and 40-45% by echo 06/2017.  . Malignant neoplasm of upper-outer quadrant of right female breast ([place]) 02/24/2016  . Meningioma ([place])   . Nuclear stress test    Nuclear stress test 10/19: EF 35, inferolateral, apical scar; inferior, apical inferior infarct with mild peri-infarct ischemia; high risk  . Obesity   . Personal history of chemotherapy   . Personal history of radiation therapy   . Pituitary tumor   . Seasonal asthma   . Sleep apnea    does not use every night  . Stroke [facility])    TIA - Dec 1 st, 2017    Past Surgical History:  Procedure Laterality Date  . ABDOMINAL HYSTERECTOMY    . BRAIN MENINGIOMA EXCISION  2005  . BREAST BIOPSY    . BREAST LUMPECTOMY Right  2017  . BREAST LUMPECTOMY WITH RADIOACTIVE SEED LOCALIZATION Right 04/20/2016   Procedure: RIGHT BREAST LUMPECTOMY WITH RADIOACTIVE SEED LOCALIZATION;  Surgeon: Fanny Skates, MD;  Location: [place];  Service: General;  Laterality: Right;  . BREAST REDUCTION SURGERY Bilateral 10/23/2016   Procedure: BILATERAL  BREAST REDUCTION WITH LIPOSUCTION ASSISTANCE;  Surgeon: Cristine Polio, MD;  Location: [place];  Service: Plastics;  Laterality: Bilateral;  . CESAREAN SECTION    . CORONARY STENT INTERVENTION N/A 06/25/2017   Procedure: CORONARY STENT INTERVENTION;  Surgeon: Sherren Mocha, MD;  Location: [place] CV LAB;  Service: Cardiovascular;  Laterality: N/A;  . CORONARY/GRAFT ACUTE MI REVASCULARIZATION N/A 06/25/2017   Procedure: Coronary/Graft Acute MI Revascularization;  Surgeon: Sherren Mocha, MD;  Location: [place] CV LAB;  Service: Cardiovascular;  Laterality: N/A;  . FOOT SURGERY Bilateral 2016   hammer toe surgery  . LEFT HEART CATH AND CORONARY ANGIOGRAPHY N/A 06/25/2017   Procedure: LEFT HEART CATH AND CORONARY ANGIOGRAPHY;  Surgeon: Sherren Mocha, MD;  Location: [place] [facility];  Service: Cardiovascular;  Laterality: N/A;  . MENISCUS REPAIR Left 2015  . PITUITARY SURGERY    . REDUCTION MAMMAPLASTY    . RIGHT HEART CATH N/A 06/25/2017   Procedure: RIGHT HEART CATH;  Surgeon: Sherren Mocha, MD;  Location: [place] CV LAB;  Service: Cardiovascular;  Laterality: N/A;  . THYROID SURGERY      Family History  Problem Relation Age of Onset  . Diabetes Mother   . Hyperlipidemia Mother   . Hypertension Mother     Social History:  reports that she has never smoked. She has never used smokeless tobacco. She reports current alcohol use of about 1.0 standard drinks of alcohol per week. She reports that she does not use drugs.  Review of Systems:  Hypertension:  Variable control, blood pressure is managed by cardiologist with readings as follows:  Home blood pressure has been checked fairly regularly and she feels that systolic reading is about 140 She is not on any ACE/ARB medications  BP Readings from Last 3 Encounters:  06/06/19 140/82  05/16/19 127/79  04/22/19 (!) 156/87   She takes Lasix only occasionally  Lipids: She was started on Repatha in 08/2017  but she claims she had leg pains and is not on any treatment except pravastatin once a week from her PCP  LDL variably high recently She does have a history of MI  Lab Results  Component Value Date   CHOL 223 (H) 04/23/2019   HDL 57 04/23/2019   LDLCALC 141 (H) 04/23/2019   LDLDIRECT 87.0 06/04/2019   TRIG 138 04/23/2019   CHOLHDL 3.9 04/23/2019   Lab Results  Component Value Date   ALT 19 06/11/2018    Diabetic shoe prescription has been given previously  Diabetic Foot Exam - Simple   Simple Foot Form Diabetic Foot exam was performed with the following findings: Yes   Visual Inspection No deformities, no ulcerations, no other skin breakdown bilaterally: Yes Sensation Testing Intact to touch and monofilament testing bilaterally: Yes Pulse Check Posterior Tibialis and Dorsalis pulse intact bilaterally: Yes Comments       Examination:   BP 140/82 (BP Location: Left Arm, Patient Position: Sitting, Cuff Size: Normal)   Pulse 92   Ht 6' (1.829 m)   Wt 266 lb (120.7 kg)   SpO2 99%   BMI 36.08 kg/m   Body mass index is 36.08 kg/m.     ASSESSMENT/ PLAN:  Diabetes type 2 with obesity See history of present illness for detailed discussion of current management, interpretation of her continuous glucose monitoring record, blood sugar patterns and problems identified  Her A1c has usually been consistently high and now 9.3  Although blood sugars are better than on her last visit with increasing her Lantus and Humalog she still has erratic control She cannot control her diet well Also recently not exercising and gaining weight Now with using the Dexcom she is able to keep up with her blood sugars better However still does not realize that she has to take Humalog before the meal to keep it from going up and avoiding late hypoglycemia   Recommendations today:   Discussed need to take her HUMALOG before starting to eat each meal She can continue about 20 units average  with 5 to 10 units more for larger meals or carbohydrates and less for lighter meals We will try to get her a prescription for TOUJEO with a co-pay card and if covered she can start with 54 units in the evenings Given her detailed titration sheet for adjusting it based on fasting blood sugars with a target of 130 or less RYBELSUS will be increased to 14 mg without need for further observation on 7 mg since she has previously tolerated 1.8 mg Victoza before However if she does not get enough satiety from this may consider 1 mg Ozempic and discussed that this would be better effective Exercise 5 days a week Cut back on carbohydrate intake Keep postprandial readings under 180  Discussed action of SGLT 2 drugs on lowering glucose by decreasing kidney absorption of glucose, benefits of weight loss and lower blood pressure, possible side effects including candidiasis and dosage regimen  She will start 10 mg Jardiance daily and if needed can use Diflucan for candidiasis if this occurs Encouraged her to increase fluids If her blood pressure is below 125 she will reduce her amlodipine to 2.5 mg Avoid Lasix   HYPERTENSION: Blood pressure followed by cardiologist and is fairly good She is not on any ACE/ARB medications but may benefit from [place]: Followed elsewhere and now only on weekly pravastatin, surprisingly LDL now is only 87  Needs more regular follow-up    Patient Instructions  If BP <125 then cut amlodipine in 1/2   Walk daily   Toujeo insulin: This insulin provides blood sugar control for up to 24 hours.   Start with 54 units at bedtime daily and increase by 2 units every 3 days until the waking up sugars are under 130. Then continue the same dose.  If blood sugar is under 90 for 2 days in a row, reduce the dose by 2 units.  Note that this insulin does not control the rise of blood sugar with meals     Elayne Snare 06/06/2019, 2:43 PM   This visit occurred during the  SARS-CoV-2 public health emergency.  Safety protocols were in place, including screening questions prior to the visit, additional usage of staff PPE, and extensive cleaning of exam room while observing appropriate contact time as indicated for disinfecting solutions.

## 2019-06-06 NOTE — Patient Instructions (Addendum)
If BP <125 then cut amlodipine in 1/2   Walk daily   Toujeo insulin: This insulin provides blood sugar control for up to 24 hours.   Start with 54 units at bedtime daily and increase by 2 units every 3 days until the waking up sugars are under 130. Then continue the same dose.  If blood sugar is under 90 for 2 days in a row, reduce the dose by 2 units.  Note that this insulin does not control the rise of blood sugar with meals

## 2019-06-10 ENCOUNTER — Telehealth: Payer: Self-pay

## 2019-06-10 NOTE — Telephone Encounter (Signed)
Received PA from [email or web address] stating that a PA is required for Jardiance 10mg  tablets.   Covered alternatives are as follows:  Metformin 500 Mg Tab tier-6 NOT Required* Pioglitazone 30 Mg Tab tier-8, QL NOT Required* Metformin 500 Mg Tb24 tier-13 NOT Required* Tradjenta tier-7, QL NOT Required* Trulicity 1.5 0000000 Ml Pnij tier-10, QL, ST Required* Steglatro 15 Mg Tab tier-1, ST Required* Janumet 50-1,000 Mg Tab tier-9, ST Required* Invokana 300 Mg Tab tier-2 Required* Farxiga 10 Mg Tab tier-3 Required* Alogliptin 25 Mg Tab tier-5 Required* Levemir FlexTouch U-100 Insuln tier-11 Required* Basaglar KwikPen U-100 Insulin tier-12 Required*

## 2019-06-11 ENCOUNTER — Other Ambulatory Visit: Payer: Self-pay

## 2019-06-11 MED ORDER — FARXIGA 5 MG PO TABS: 5.0000 mg | ORAL_TABLET | Freq: Every day | ORAL | 2 refills | Status: DC

## 2019-06-11 NOTE — Telephone Encounter (Signed)
Change to Farxiga 5 mg daily

## 2019-06-11 NOTE — Telephone Encounter (Signed)
Please advise 

## 2019-06-11 NOTE — Telephone Encounter (Signed)
Rx changed and pt notified via mychart.

## 2019-06-13 ENCOUNTER — Telehealth: Payer: Self-pay

## 2019-06-13 NOTE — Telephone Encounter (Signed)
PA initiated via [email or web address] for Farxiga 5mg  tablets.   [facility] ([facility]) Rx #: [phone] Wilder Glade 5MG  tablets   Form [employer] Electronic Prior Authorization Form (2017 NCPDP) Created 2 days ago Sent to Plan 2 minutes ago Plan Response 2 minutes ago Submit Clinical Questions less than a minute ago Determination Wait for Determination Please wait for [employer] 2017 NCPDP to return a determination.

## 2019-06-16 NOTE — Telephone Encounter (Signed)
Received fax from [employer] stating that the pt has been approved for Farxiga 5mg  tablets.  Medication is approved through 06/12/2020.

## 2019-06-24 MED ORDER — NITROGLYCERIN 0.4 MG SL SUBL: 0.4000 mg | SUBLINGUAL_TABLET | SUBLINGUAL | 0 refills | Status: DC | PRN

## 2019-06-24 NOTE — Telephone Encounter (Signed)
Pt reports experiencing CP the past few days.  Denies any CP currently/today. States that for past several days she has experienced back pain, CP and then anxiety/panic attack.  Confirms that CP occurs prior to anxiety attack.  Does experience dizziness/nausea during events.  Denies SOB, current CP, syncope, edema.  She then reports that she did have radiating arm pain r/t CP yesterday.  She took 2 nitro yesterday but is out now. Reviewed call/concern/case w/ Richardson Dopp, PA.  He will see her tomorrow afternoon for evaluation. Confirmed understanding of nitro, how to take, how frequently and when to call 911.   1 bottle sent to pharmacy.   Advised if CP re-occurs to go straight to ED/call 911. Patient verbalized understanding and agreeable to plan.

## 2019-06-25 ENCOUNTER — Encounter: Payer: Self-pay | Admitting: Physician Assistant

## 2019-06-25 ENCOUNTER — Encounter (HOSPITAL_COMMUNITY): Payer: Self-pay

## 2019-06-25 ENCOUNTER — Other Ambulatory Visit: Payer: Self-pay

## 2019-06-25 ENCOUNTER — Ambulatory Visit (INDEPENDENT_AMBULATORY_CARE_PROVIDER_SITE_OTHER): Payer: 59 | Admitting: Physician Assistant

## 2019-06-25 ENCOUNTER — Emergency Department (HOSPITAL_COMMUNITY)
Admission: EM | Admit: 2019-06-25 | Discharge: 2019-06-26 | Disposition: A | Discharge status: Home / Self Care | Payer: 59 | Attending: Emergency Medicine | Admitting: Emergency Medicine

## 2019-06-25 ENCOUNTER — Emergency Department (HOSPITAL_COMMUNITY): Payer: 59

## 2019-06-25 VITALS — BP 146/80 | HR 87 | Ht 72.0 in | Wt 259.0 lb

## 2019-06-25 DIAGNOSIS — I25110 Atherosclerotic heart disease of native coronary artery with unstable angina pectoris: Secondary | ICD-10-CM | POA: Insufficient documentation

## 2019-06-25 DIAGNOSIS — Z79899 Other long term (current) drug therapy: Secondary | ICD-10-CM | POA: Insufficient documentation

## 2019-06-25 DIAGNOSIS — U071 COVID-19: Secondary | ICD-10-CM | POA: Insufficient documentation

## 2019-06-25 DIAGNOSIS — R0789 Other chest pain: Secondary | ICD-10-CM | POA: Insufficient documentation

## 2019-06-25 DIAGNOSIS — I110 Hypertensive heart disease with heart failure: Secondary | ICD-10-CM | POA: Diagnosis not present

## 2019-06-25 DIAGNOSIS — I10 Essential (primary) hypertension: Secondary | ICD-10-CM

## 2019-06-25 DIAGNOSIS — Z8673 Personal history of transient ischemic attack (TIA), and cerebral infarction without residual deficits: Secondary | ICD-10-CM | POA: Diagnosis not present

## 2019-06-25 DIAGNOSIS — I255 Ischemic cardiomyopathy: Secondary | ICD-10-CM | POA: Insufficient documentation

## 2019-06-25 DIAGNOSIS — I252 Old myocardial infarction: Secondary | ICD-10-CM | POA: Insufficient documentation

## 2019-06-25 DIAGNOSIS — I200 Unstable angina: Secondary | ICD-10-CM

## 2019-06-25 DIAGNOSIS — Z7982 Long term (current) use of aspirin: Secondary | ICD-10-CM | POA: Insufficient documentation

## 2019-06-25 DIAGNOSIS — E785 Hyperlipidemia, unspecified: Secondary | ICD-10-CM | POA: Insufficient documentation

## 2019-06-25 DIAGNOSIS — I5022 Chronic systolic (congestive) heart failure: Secondary | ICD-10-CM

## 2019-06-25 DIAGNOSIS — M79605 Pain in left leg: Secondary | ICD-10-CM

## 2019-06-25 DIAGNOSIS — G4733 Obstructive sleep apnea (adult) (pediatric): Secondary | ICD-10-CM | POA: Insufficient documentation

## 2019-06-25 DIAGNOSIS — Z794 Long term (current) use of insulin: Secondary | ICD-10-CM | POA: Diagnosis not present

## 2019-06-25 DIAGNOSIS — Z955 Presence of coronary angioplasty implant and graft: Secondary | ICD-10-CM | POA: Diagnosis not present

## 2019-06-25 DIAGNOSIS — Z853 Personal history of malignant neoplasm of breast: Secondary | ICD-10-CM | POA: Diagnosis not present

## 2019-06-25 DIAGNOSIS — E1165 Type 2 diabetes mellitus with hyperglycemia: Secondary | ICD-10-CM | POA: Diagnosis not present

## 2019-06-25 DIAGNOSIS — E7800 Pure hypercholesterolemia, unspecified: Secondary | ICD-10-CM

## 2019-06-25 LAB — CBC
HCT: 43.4 % (ref 36.0–46.0)
Hemoglobin: 14.1 g/dL (ref 12.0–15.0)
MCH: 29.9 pg (ref 26.0–34.0)
MCHC: 32.5 g/dL (ref 30.0–36.0)
MCV: 92.1 fL (ref 80.0–100.0)
Platelets: 283 10*3/uL (ref 150–400)
RBC: 4.71 MIL/uL (ref 3.87–5.11)
RDW: 14.1 % (ref 11.5–15.5)
WBC: 4.5 10*3/uL (ref 4.0–10.5)
nRBC: 0 % (ref 0.0–0.2)

## 2019-06-25 LAB — BASIC METABOLIC PANEL
Anion gap: 11 (ref 5–15)
BUN: 15 mg/dL (ref 8–23)
CO2: 24 mmol/L (ref 22–32)
Calcium: 9.1 mg/dL (ref 8.9–10.3)
Chloride: 100 mmol/L (ref 98–111)
Creatinine, Ser: 0.97 mg/dL (ref 0.44–1.00)
GFR calc Af Amer: >60 mL/min (ref >60)
GFR calc non Af Amer: >60 mL/min (ref >60)
Glucose, Bld: 290 mg/dL — ABNORMAL HIGH (ref 70–99)
Potassium: 4.4 mmol/L (ref 3.5–5.1)
Sodium: 135 mmol/L (ref 135–145)

## 2019-06-25 LAB — CBG MONITORING, ED: Glucose-Capillary: 240 mg/dL — ABNORMAL HIGH (ref 70–99)

## 2019-06-25 LAB — TROPONIN I (HIGH SENSITIVITY)
Troponin I (High Sensitivity): 7 ng/L (ref <18)
Troponin I (High Sensitivity): 6 ng/L (ref <18)

## 2019-06-25 NOTE — H&P (Addendum)
Cardiology Admission H&P:    Date:  06/25/2019   ID:  AUSTYN PERRIELLO, DOB 01-May-1955, MRN [id number]  PCP:  Lawerance Cruel, MD  Cardiologist:  Sherren Mocha, MD   Electrophysiologist:  None   Referring MD: Lawerance Cruel, MD   Chief Complaint:  No chief complaint on file.    Patient Profile:    Destiny Beasley is a 64 y.o. female with:   Coronary artery disease  ? S/p inf-lat STEMI in 06/2017 c/b CGS, acute HFrEF, CHB >>PCI: DES to pLCx ? Myoview 10/19: inferior scar, mild peri-infarct ischemia  Chronic Systolic CHF ? Ischemic CM ? Echocardiogram 3/19: EF 40-45 ? Echocardiogram 11/19: EF 40-45 ? Echocardiogram 08/2018: EF 40-45  Diabetes mellitus   Prior TIA  OSA  Breast CA   Hypertension   Hyperlipidemia   Prior CV studies:  Echocardiogram 09/05/2018 EF 40-45, inf-lat AK, mild LVH, Gr 1 DD, normal RVSF  Echo 03/07/18 Mild LVH, EF 40-45, GLS -11%, inf-lat HK, Gr 1 DD, trivial MR  Nuclear stress test10/22/19 EF 35, inferolateral, apical scar; inferior, apical inferior infarct with mild peri-infarct ischemia; high risk  Echo 06/26/17 EF 40-45, inferolateral hypokinesis, mild MR  Cardiac catheterization 06/25/17 LM luminal irregularities LAD luminal irregularities, proximal 30 LCx proximal 100 RCA irregularities EF 45-50, inferolateral akinesis PCI: 4 x 20 mm Synergy DES to the proximal LCx  Echo 03/25/16 Moderate LVH, EF 60-65, normal wall motion  Carotid US 12/17 Bilateral ICA 1-39  Nuclear stress test 7/13 1. Negative for pharmacologic-stress induced ischemia. 2. Left ventricular ejection fraction 65%.    History of Present Illness:    Ms. Esquivias was last seen in clinic in 03/2019.  She was seen in the ED in 04/2019 w/ chest pain.  Her hs-Trop was neg.  She called in yesterday with symptoms of chest pain and asked for her NTG to be refilled.  She was added on for further evaluation.  She notes symptoms of substernal chest pain  the last 3 days.  She has been taking NTG with some relief.  However, her symptoms are fairly constant. She has associated shortness of breath, nausea and diaphoresis as well as radiation to her back and arms.  She notes shortness of breath with just minimal activity.  She has been using her BiPap at night to help with her breathing.  She does have some pain with taking a deep breath.  She denies exposure to COVID or change in her taste/smell.  She does not remember having pain with her MI as she passed out when it occurred.  So, she is not sure if her symptoms are like her angina.    Past Medical History:  Diagnosis Date  . Anemia   . Anxiety   . CAD in native artery    a. Inf STEMI 08/3297 complicated by cardiogenic shock/complete heart block s/p emergent cath showing culprit large dominant LCx s/p aspiration thrombectomy and DES, EF 50% by cath, 40-45% by echo.  . Chronic systolic heart failure ([place]) 06/25/2017   Echo 11/19: mild LVH, EF 40-45, inf-lat HK, Gr 1 DD, trivial MR  . Complete heart block, transient ([place])    a. 06/2017 in setting of acute inf MI -> resolved after PCI.  . Diabetes mellitus   . Goiter   . History of radiation therapy 06/14/2016 - 07/19/2016   Right Breast 50 Gy 25 fractions  . Hypercholesteremia   . Hypertension   . Ischemic cardiomyopathy    a. EF  50% by cath and 40-45% by echo 06/2017.  . Malignant neoplasm of upper-outer quadrant of right female breast ([place]) 02/24/2016  . Meningioma ([place])   . Nuclear stress test    Nuclear stress test 10/19: EF 35, inferolateral, apical scar; inferior, apical inferior infarct with mild peri-infarct ischemia; high risk  . Obesity   . Personal history of chemotherapy   . Personal history of radiation therapy   . Pituitary tumor   . Seasonal asthma   . Sleep apnea    does not use every night  . Stroke [facility])    TIA - Dec 1 st, 2017    Current Medications: No outpatient medications have been marked as taking for the 06/25/19  encounter (Appointment) with Richardson Dopp T, PA-C.     Allergies:   Hydrocodone, Invokana [canagliflozin], Imdur [isosorbide nitrate], Repatha [evolocumab], Rosuvastatin, Simvastatin, and Atorvastatin   Social History   Tobacco Use  . Smoking status: Never Smoker  . Smokeless tobacco: Never Used  Substance Use Topics  . Alcohol use: Yes    Alcohol/week: 1.0 standard drinks    Types: 1 Glasses of wine per week    Comment: 1 glass monthly  . Drug use: No     Family Hx: The patient's family history includes Diabetes in her mother; Hyperlipidemia in her mother; Hypertension in her mother.  Review of Systems  Musculoskeletal:       She notes L leg pain near her groin, worse with standing and walking     EKGs/Labs/Other Test Reviewed:    EKG:  EKG is   ordered today.  The ekg ordered today demonstrates normal sinus rhythm, heart rate 87, normal axis, inferolateral T wave inversions, PVC, QTC 430  Recent Labs: 09/05/2018: Magnesium 1.8 05/16/2019: BUN 16; Creatinine, Ser 0.96; Hemoglobin 13.0; Platelets 307; Potassium 4.3; Sodium 138   Recent Lipid Panel Lab Results  Component Value Date/Time   CHOL 223 (H) 04/23/2019 07:14 AM   TRIG 138 04/23/2019 07:14 AM   HDL 57 04/23/2019 07:14 AM   CHOLHDL 3.9 04/23/2019 07:14 AM   CHOLHDL 2 08/12/2018 09:44 AM   LDLCALC 141 (H) 04/23/2019 07:14 AM   LDLDIRECT 87.0 06/04/2019 08:35 AM    Physical Exam:    VS:  There were no vitals taken for this visit.    Wt Readings from Last 3 Encounters:  06/06/19 266 lb (120.7 kg)  04/22/19 262 lb (118.8 kg)  04/03/19 259 lb (117.5 kg)     Constitutional:      Appearance: Healthy appearance. Not in distress.  Neck:     Thyroid: Thyroid normal.     Vascular: JVD normal.  Pulmonary:     Effort: Pulmonary effort is normal.     Breath sounds: No wheezing. No rales.  Cardiovascular:     Normal rate. Regular rhythm. Normal S1. Normal S2.     Murmurs: There is no murmur.  Pulses:     Femoral: 2+ on the left side.    Dorsalis pedis: 2+ on the left side.    Posterior tibial: 2+ on the left side. Edema:    Peripheral edema absent.  Abdominal:     Palpations: Abdomen is soft. There is no hepatomegaly.  Musculoskeletal:     Comments: L leg warm to the touch Skin:    General: Skin is warm and dry.  Neurological:     General: No focal deficit present.     Mental Status: Alert and oriented to person, place and time.  ASSESSMENT & PLAN:    1. Coronary artery disease involving native coronary artery of native heart with unstable angina pectoris [facility]) She is status post inferior myocardial infarction in March 8616 complicated by cardiogenic shock, complete heart block and acute CHF.  She underwent primary PCI with DES to the LCx at that time.  She is largely done well until now.  She presents to the office with chest discomfort consistent with unstable angina.  She does have a pleuritic component as well.  Her ECG does not demonstrate any acute findings and is consistent with her previous tracings.  However, given her history and current symptoms, she requires admission to the hospital for cardiac catheterization.  I reviewed her case today with Dr. Johnsie Cancel (attending MD) who also saw the patient.  She will be admitted to [place] via ambulance.  We will plan on cardiac catheterization tomorrow.  If this is unrevealing, she would likely need chest CT to rule out pulmonary embolism.  Risks and benefits of cardiac catheterization have been discussed with the patient.  These include bleeding, infection, kidney damage, stroke, heart attack, death.  The patient understands these risks and is willing to proceed.   -Admit to [employer] [facility]  -IV NTG   -IV Heparin   -Continue aspirin, carvedilol, pravastatin  -SARS-CoV-2 Test tonight  -CMET, CBC, hs-Trop  -CXR  -DDimer, ESR, CRP  -Echocardiogram   -Plan L cardiac catheterization tomorrow   2. Chronic systolic heart  failure (HCC) EF 40-45 by echocardiogram 08/2018.  NYHA III.  Volume status appears stable.  Continue current dose of carvedilol, irbesartan.  She was intolerant of spironolactone.  3. Essential hypertension Blood pressure elevated.  Continue amlodipine, irbesartan, carvedilol.  If blood pressure remains elevated, we may need to increase her dose of amlodipine.  4. Hypercholesterolemia She is intol of statins, PCSK9 inhibitors.  Continue low dose Pravastatin.   5. Left leg pain Etiology not entirely clear.  She has good pulses and evidence of good blood flow.  If symptoms continue we may need to consider consulting orthopedics.   TIMI Risk Score for Unstable Angina or Non-ST Elevation MI:   The patient's TIMI risk score is 4, which indicates a 20% risk of all cause mortality, new or recurrent myocardial infarction or need for urgent revascularization in the next 14 days.    Severity of Illness: The appropriate patient status for this patient is OBSERVATION. Observation status is judged to be reasonable and necessary in order to provide the required intensity of service to ensure the patient's safety. The patient's presenting symptoms, physical exam findings, and initial radiographic and laboratory data in the context of their medical condition is felt to place them at decreased risk for further clinical deterioration. Furthermore, it is anticipated that the patient will be medically stable for discharge from the hospital within 2 midnights of admission. The following factors support the patient status of observation.   " The patient's presenting symptoms include chest pain, sob. " The physical exam findings include n/a. " The initial radiographic and laboratory data are pending.     For questions or updates, please contact [place] Please consult [email or web address] for contact info under     Signed, Richardson Dopp, PA-C  06/25/2019 6:23 PM

## 2019-06-25 NOTE — Progress Notes (Signed)
Cardiology Office Note:    Date:  06/25/2019   ID:  Destiny Beasley, DOB 1955/10/14, MRN [id number]  PCP:  Destiny Cruel, MD  Cardiologist:  Destiny Mocha, MD   Electrophysiologist:  None   Referring MD: Destiny Cruel, MD   Chief Complaint:  Chest Pain    Patient Profile:    Destiny Beasley is a 64 y.o. female with:   Coronary artery disease  ? S/p inf-lat STEMI in 06/2017 c/b CGS, acute HFrEF, CHB >>PCI: DES to pLCx ? Myoview 10/19: inferior scar, mild peri-infarct ischemia  Chronic Systolic CHF ? Ischemic CM ? Echocardiogram 3/19: EF 40-45 ? Echocardiogram 11/19: EF 40-45 ? Echocardiogram 08/2018: EF 40-45  Diabetes mellitus   Prior TIA  OSA  Breast CA   Hypertension   Hyperlipidemia   Prior CV studies:  Echocardiogram 09/05/2018 EF 40-45, inf-lat AK, mild LVH, Gr 1 DD, normal RVSF  Echo 03/07/18 Mild LVH, EF 40-45, GLS -11%, inf-lat HK, Gr 1 DD, trivial MR  Nuclear stress test10/22/19 EF 35, inferolateral, apical scar; inferior, apical inferior infarct with mild peri-infarct ischemia; high risk  Echo 06/26/17 EF 40-45, inferolateral hypokinesis, mild MR  Cardiac catheterization 06/25/17 LM luminal irregularities LAD luminal irregularities, proximal 30 LCx proximal 100 RCA irregularities EF 45-50, inferolateral akinesis PCI: 4 x 20 mm Synergy DES to the proximal LCx  Echo 03/25/16 Moderate LVH, EF 60-65, normal wall motion  Carotid US 12/17 Bilateral ICA 1-39  Nuclear stress test 7/13 1. Negative for pharmacologic-stress induced ischemia. 2. Left ventricular ejection fraction 65%.    History of Present Illness:    [name] was last seen in clinic in 03/2019.  She was seen in the ED in 04/2019 w/ chest pain.  Her hs-Trop was neg.  She called in yesterday with symptoms of chest pain and asked for her NTG to be refilled.  She was added on for further evaluation.  She notes symptoms of substernal chest pain the last 3 days.   She has been taking NTG with some relief.  However, her symptoms are fairly constant. She has associated shortness of breath, nausea and diaphoresis as well as radiation to her back and arms.  She notes shortness of breath with just minimal activity.  She has been using her BiPap at night to help with her breathing.  She does have some pain with taking a deep breath.  She denies exposure to COVID or change in her taste/smell.  She does not remember having pain with her MI as she passed out when it occurred.  So, she is not sure if her symptoms are like her angina.    Past Medical History:  Diagnosis Date  . Anemia   . Anxiety   . CAD in native artery    a. Inf STEMI 06/8754 complicated by cardiogenic shock/complete heart block s/p emergent cath showing culprit large dominant LCx s/p aspiration thrombectomy and DES, EF 50% by cath, 40-45% by echo.  . Chronic systolic heart failure ([place]) 06/25/2017   Echo 11/19: mild LVH, EF 40-45, inf-lat HK, Gr 1 DD, trivial MR  . Complete heart block, transient ([place])    a. 06/2017 in setting of acute inf MI -> resolved after PCI.  . Diabetes mellitus   . Goiter   . History of radiation therapy 06/14/2016 - 07/19/2016   Right Breast 50 Gy 25 fractions  . Hypercholesteremia   . Hypertension   . Ischemic cardiomyopathy    a. EF 50% by cath  and 40-45% by echo 06/2017.  . Malignant neoplasm of upper-outer quadrant of right female breast ([place]) 02/24/2016  . Meningioma ([place])   . Nuclear stress test    Nuclear stress test 10/19: EF 35, inferolateral, apical scar; inferior, apical inferior infarct with mild peri-infarct ischemia; high risk  . Obesity   . Personal history of chemotherapy   . Personal history of radiation therapy   . Pituitary tumor   . Seasonal asthma   . Sleep apnea    does not use every night  . Stroke [facility])    TIA - Dec 1 st, 2017    Current Medications: Current Meds  Medication Sig  . amLODipine (NORVASC) 5 MG tablet Take 5 mg by mouth  daily.  Marland Kitchen aspirin EC 81 MG tablet Take 1 tablet (81 mg total) by mouth daily.  . carvedilol (COREG) 12.5 MG tablet Take 1 tablet (12.5 mg total) by mouth 2 (two) times daily.  . Cholecalciferol (VITAMIN D-3 PO) Take 1 tablet by mouth daily.   . Continuous Blood Gluc Transmit (DEXCOM G6 TRANSMITTER) MISC 1 each by Does not apply route daily.  . furosemide (LASIX) 20 MG tablet Take 1 tablet (20 mg total) by mouth daily.  . insulin glargine (LANTUS) 100 UNIT/ML injection Inject 60 Units into the skin daily.  . insulin lispro (HUMALOG KWIKPEN) 100 UNIT/ML KwikPen Inject 20 Units into the skin in the morning and at bedtime. Inject 20 units under the skin before lunch and dinner.  . irbesartan (AVAPRO) 300 MG tablet Take 300 mg by mouth daily.  . MULTIPLE VITAMIN PO Take 1 tablet daily by mouth.  . nitroGLYCERIN (NITROSTAT) 0.4 MG SL tablet Place 1 tablet (0.4 mg total) under the tongue every 5 (five) minutes as needed for chest pain.  . pravastatin (PRAVACHOL) 20 MG tablet Take 20 mg by mouth once a week.  . Semaglutide (RYBELSUS) 14 MG TABS Take 1 tablet by mouth daily before breakfast.  . Venlafaxine HCl 150 MG TB24 Take 150 mg by mouth daily as needed (for nerves).      Allergies:   Hydrocodone, Invokana [canagliflozin], Imdur [isosorbide nitrate], Repatha [evolocumab], Rosuvastatin, Simvastatin, and Atorvastatin   Social History   Tobacco Use  . Smoking status: Never Smoker  . Smokeless tobacco: Never Used  Substance Use Topics  . Alcohol use: Yes    Alcohol/week: 1.0 standard drinks    Types: 1 Glasses of wine per week    Comment: 1 glass monthly  . Drug use: No     Family Hx: The patient's family history includes Diabetes in her mother; Hyperlipidemia in her mother; Hypertension in her mother.  Review of Systems  Musculoskeletal:       She notes L leg pain near her groin, worse with standing and walking     EKGs/Labs/Other Test Reviewed:    EKG:  EKG is   ordered today.  The  ekg ordered today demonstrates normal sinus rhythm, heart rate 87, normal axis, inferolateral T wave inversions, PVC, QTC 430  Recent Labs: 09/05/2018: Magnesium 1.8 05/16/2019: BUN 16; Creatinine, Ser 0.96; Hemoglobin 13.0; Platelets 307; Potassium 4.3; Sodium 138   Recent Lipid Panel Lab Results  Component Value Date/Time   CHOL 223 (H) 04/23/2019 07:14 AM   TRIG 138 04/23/2019 07:14 AM   HDL 57 04/23/2019 07:14 AM   CHOLHDL 3.9 04/23/2019 07:14 AM   CHOLHDL 2 08/12/2018 09:44 AM   LDLCALC 141 (H) 04/23/2019 07:14 AM   LDLDIRECT 87.0 06/04/2019 08:35  AM    Physical Exam:    VS:  BP (!) 146/80   Pulse 87   Ht 6' (1.829 m)   Wt 259 lb (117.5 kg)   SpO2 97%   BMI 35.13 kg/m     Wt Readings from Last 3 Encounters:  06/25/19 259 lb (117.5 kg)  06/06/19 266 lb (120.7 kg)  04/22/19 262 lb (118.8 kg)     Constitutional:      Appearance: Healthy appearance. Not in distress.  Neck:     Thyroid: Thyroid normal.     Vascular: JVD normal.  Pulmonary:     Effort: Pulmonary effort is normal.     Breath sounds: No wheezing. No rales.  Cardiovascular:     Normal rate. Regular rhythm. Normal S1. Normal S2.     Murmurs: There is no murmur.  Pulses:    Femoral: 2+ on the left side.    Dorsalis pedis: 2+ on the left side.    Posterior tibial: 2+ on the left side. Edema:    Peripheral edema absent.  Abdominal:     Palpations: Abdomen is soft. There is no hepatomegaly.  Musculoskeletal:     Comments: L leg warm to the touch Skin:    General: Skin is warm and dry.  Neurological:     General: No focal deficit present.     Mental Status: Alert and oriented to person, place and time.       ASSESSMENT & PLAN:    1. Coronary artery disease involving native coronary artery of native heart with unstable angina pectoris [facility]) She is status post inferior myocardial infarction in March 6294 complicated by cardiogenic shock, complete heart block and acute CHF.  She underwent primary PCI  with DES to the LCx at that time.  She is largely done well until now.  She presents to the office with chest discomfort consistent with unstable angina.  She does have a pleuritic component as well.  Her ECG does not demonstrate any acute findings and is consistent with her previous tracings.  However, given her history and current symptoms, she requires admission to the hospital for cardiac catheterization.  I reviewed her case today with Dr. Johnsie Cancel (attending MD) who also saw the patient.  She will be admitted to [place] via ambulance.  We will plan on cardiac catheterization tomorrow.  If this is unrevealing, she would likely need chest CT to rule out pulmonary embolism.  Risks and benefits of cardiac catheterization have been discussed with the patient.  These include bleeding, infection, kidney damage, stroke, heart attack, death.  The patient understands these risks and is willing to proceed.   -Admit to [employer] [facility]  -IV NTG   -IV Heparin   -Continue aspirin, carvedilol, pravastatin  -SARS-CoV-2 Test tonight  -CMET, CBC, hs-Trop  -CXR  -DDimer, ESR, CRP  -Echocardiogram   -Plan L cardiac catheterization tomorrow   2. Chronic systolic heart failure (HCC) EF 40-45 by echocardiogram 08/2018.  NYHA III.  Volume status appears stable.  Continue current dose of carvedilol, irbesartan.  She was intolerant of spironolactone.  3. Essential hypertension Blood pressure elevated.  Continue amlodipine, irbesartan, carvedilol.  If blood pressure remains elevated, we may need to increase her dose of amlodipine.  4. Hypercholesterolemia She is intol of statins, PCSK9 inhibitors.  Continue low dose Pravastatin.   5. Left leg pain Etiology not entirely clear.  She has good pulses and evidence of good blood flow.  If symptoms continue we  may need to consider consulting orthopedics.    Dispo:  Return for Follow up After Dischage from Hospital.   Medication Adjustments/Labs and Tests  Ordered: Current medicines are reviewed at length with the patient today.  Concerns regarding medicines are outlined above.  Tests Ordered: Orders Placed This Encounter  Procedures  . EKG 12-Lead   Medication Changes: No orders of the defined types were placed in this encounter.   Signed, Richardson Dopp, PA-C  06/25/2019 6:27 PM    [facility] [place], [place], [place]  [address] Phone: [phone]; Fax: [phone]

## 2019-06-25 NOTE — Patient Instructions (Signed)
YOU HAVE BEEN RECOMMENDED TO GO TO THE EMERGENCY ROOM DEPT BY AMBULANCE

## 2019-06-25 NOTE — ED Triage Notes (Signed)
Pt arrives by EMS from [facility] for chest pain x3 days with SOB. Cards is scheduling a cath procedure for tomorrow. Pt a.o, nad noted

## 2019-06-26 ENCOUNTER — Other Ambulatory Visit: Payer: Self-pay | Admitting: Physician Assistant

## 2019-06-26 ENCOUNTER — Encounter (HOSPITAL_COMMUNITY): Payer: Self-pay

## 2019-06-26 ENCOUNTER — Inpatient Hospital Stay (HOSPITAL_COMMUNITY): Payer: 59

## 2019-06-26 ENCOUNTER — Emergency Department (HOSPITAL_BASED_OUTPATIENT_CLINIC_OR_DEPARTMENT_OTHER)
Admission: EM | Admit: 2019-06-26 | Discharge: 2019-06-26 | Disposition: A | Discharge status: Home / Self Care | Payer: 59 | Source: Home / Self Care | Attending: Emergency Medicine | Admitting: Emergency Medicine

## 2019-06-26 DIAGNOSIS — I5022 Chronic systolic (congestive) heart failure: Secondary | ICD-10-CM

## 2019-06-26 DIAGNOSIS — R0789 Other chest pain: Secondary | ICD-10-CM

## 2019-06-26 DIAGNOSIS — U071 COVID-19: Secondary | ICD-10-CM

## 2019-06-26 DIAGNOSIS — M79609 Pain in unspecified limb: Secondary | ICD-10-CM

## 2019-06-26 DIAGNOSIS — I25110 Atherosclerotic heart disease of native coronary artery with unstable angina pectoris: Secondary | ICD-10-CM

## 2019-06-26 DIAGNOSIS — E1165 Type 2 diabetes mellitus with hyperglycemia: Secondary | ICD-10-CM

## 2019-06-26 DIAGNOSIS — I2510 Atherosclerotic heart disease of native coronary artery without angina pectoris: Secondary | ICD-10-CM | POA: Diagnosis not present

## 2019-06-26 DIAGNOSIS — R079 Chest pain, unspecified: Secondary | ICD-10-CM | POA: Diagnosis present

## 2019-06-26 LAB — RESPIRATORY PANEL BY RT PCR (FLU A&B, COVID)
Influenza A by PCR: NEGATIVE
Influenza B by PCR: NEGATIVE
SARS Coronavirus 2 by RT PCR: POSITIVE — AB

## 2019-06-26 LAB — CBG MONITORING, ED
Glucose-Capillary: 139 mg/dL — ABNORMAL HIGH (ref 70–99)
Glucose-Capillary: 150 mg/dL — ABNORMAL HIGH (ref 70–99)

## 2019-06-26 LAB — APTT: aPTT: 26 seconds (ref 24–36)

## 2019-06-26 LAB — TROPONIN I (HIGH SENSITIVITY): Troponin I (High Sensitivity): 7 ng/L (ref <18)

## 2019-06-26 LAB — PROTIME-INR
INR: 1 (ref 0.8–1.2)
Prothrombin Time: 12.7 seconds (ref 11.4–15.2)

## 2019-06-26 LAB — D-DIMER, QUANTITATIVE: D-Dimer, Quant: 0.34 ug/mL-FEU (ref 0.00–0.50)

## 2019-06-26 LAB — HEPARIN LEVEL (UNFRACTIONATED): Heparin Unfractionated: 1.06 IU/mL — ABNORMAL HIGH (ref 0.30–0.70)

## 2019-06-26 SURGERY — LEFT HEART CATH AND CORONARY ANGIOGRAPHY
Anesthesia: LOCAL

## 2019-06-26 MED ORDER — HEPARIN BOLUS VIA INFUSION
4000.0000 [IU] | Freq: Once | INTRAVENOUS | Status: AC
  Administered 2019-06-26: 4000 [IU] via INTRAVENOUS
  Filled 2019-06-26: qty 4000

## 2019-06-26 MED ORDER — AMLODIPINE BESYLATE 5 MG PO TABS
5.0000 mg | ORAL_TABLET | Freq: Every day | ORAL | Status: DC
  Administered 2019-06-26: 5 mg via ORAL
  Filled 2019-06-26: qty 1

## 2019-06-26 MED ORDER — ASPIRIN EC 81 MG PO TBEC
81.0000 mg | DELAYED_RELEASE_TABLET | Freq: Every day | ORAL | Status: DC
  Administered 2019-06-26: 81 mg via ORAL
  Filled 2019-06-26: qty 1

## 2019-06-26 MED ORDER — INSULIN ASPART 100 UNIT/ML ~~LOC~~ SOLN
0.0000 [IU] | Freq: Three times a day (TID) | SUBCUTANEOUS | Status: DC
  Administered 2019-06-26 (×2): 2 [IU] via SUBCUTANEOUS
  Filled 2019-06-26: qty 0.15

## 2019-06-26 MED ORDER — CARVEDILOL 12.5 MG PO TABS
12.5000 mg | ORAL_TABLET | Freq: Two times a day (BID) | ORAL | Status: DC
  Administered 2019-06-26: 12.5 mg via ORAL
  Filled 2019-06-26 (×2): qty 1

## 2019-06-26 MED ORDER — ASPIRIN 81 MG PO CHEW
324.0000 mg | CHEWABLE_TABLET | Freq: Once | ORAL | Status: AC
  Administered 2019-06-26: 324 mg via ORAL
  Filled 2019-06-26: qty 4

## 2019-06-26 MED ORDER — IOHEXOL 300 MG/ML  SOLN
75.0000 mL | Freq: Once | INTRAMUSCULAR | Status: AC | PRN
  Administered 2019-06-26: 75 mL via INTRAVENOUS

## 2019-06-26 MED ORDER — INSULIN ASPART 100 UNIT/ML ~~LOC~~ SOLN
0.0000 [IU] | Freq: Every day | SUBCUTANEOUS | Status: DC
  Filled 2019-06-26: qty 0.05

## 2019-06-26 MED ORDER — PRAVASTATIN SODIUM 20 MG PO TABS: 20.0000 mg | ORAL_TABLET | ORAL | Status: DC

## 2019-06-26 MED ORDER — SODIUM CHLORIDE 0.9 % IV SOLN
700.0000 mg | Freq: Once | INTRAVENOUS | Status: AC
  Administered 2019-06-27: 700 mg via INTRAVENOUS
  Filled 2019-06-26: qty 20

## 2019-06-26 MED ORDER — SODIUM CHLORIDE (PF) 0.9 % IJ SOLN
INTRAMUSCULAR | Status: AC
  Filled 2019-06-26: qty 50

## 2019-06-26 MED ORDER — HEPARIN (PORCINE) 25000 UT/250ML-% IV SOLN
1100.0000 [IU]/h | INTRAVENOUS | Status: DC
  Administered 2019-06-26: 1100 [IU]/h via INTRAVENOUS
  Filled 2019-06-26: qty 250

## 2019-06-26 MED ORDER — NITROGLYCERIN IN D5W 200-5 MCG/ML-% IV SOLN
0.0000 ug/min | INTRAVENOUS | Status: DC
  Administered 2019-06-26: 5 ug/min via INTRAVENOUS
  Filled 2019-06-26: qty 250

## 2019-06-26 MED ORDER — SODIUM CHLORIDE 0.9% FLUSH
3.0000 mL | Freq: Two times a day (BID) | INTRAVENOUS | Status: DC
  Administered 2019-06-26: 3 mL via INTRAVENOUS

## 2019-06-26 NOTE — ED Provider Notes (Addendum)
64 year old female who presents today for chest tightness and shortness of breath.  She was seen by overnight ED team and admitted to cardiology service for catheterization today.  I was approached by cardiology nurse practitioner that patient's Covid test was positive and they could no longer admit the patient to their service they were asking for hospitalist admission.  Cardiology NP also mentioned that an x-ray of the chest today showed new nodularity, there was concern for possible pulmonary embolism in this patient.  I discussed the case with Dr. Ralene Bathe, plan of care at this time is to obtain D-dimer, if negative proceed with CT chest with contrast, if D-dimer is positive CT angio of the chest for evaluation of PE.  Patient has already received heparin.  Plan is to admit to hospitalist service, cardiology team plans to round in hospital and obtain echocardiogram.  Physical Exam  BP (!) 155/91 (BP Location: Right Arm)   Pulse 76   Temp 98.8 F (37.1 C)   Resp 12   SpO2 100%   Physical Exam Constitutional:      General: She is not in acute distress.    Appearance: Normal appearance. She is well-developed. She is not ill-appearing or diaphoretic.  HENT:     Head: Normocephalic and atraumatic.     Right Ear: External ear normal.     Left Ear: External ear normal.     Nose: Nose normal.  Eyes:     General: Vision grossly intact. Gaze aligned appropriately.     Pupils: Pupils are equal, round, and reactive to light.  Neck:     Trachea: Trachea and phonation normal. No tracheal deviation.  Pulmonary:     Effort: Pulmonary effort is normal. No respiratory distress.  Abdominal:     General: There is no distension.     Palpations: Abdomen is soft.     Tenderness: There is no abdominal tenderness. There is no guarding or rebound.  Musculoskeletal:        General: Normal range of motion.     Cervical back: Normal range of motion.     Right lower leg: No tenderness. No edema.     Left lower  leg: No tenderness. No edema.  Skin:    General: Skin is warm and dry.  Neurological:     Mental Status: She is alert.     GCS: GCS eye subscore is 4. GCS verbal subscore is 5. GCS motor subscore is 6.     Comments: Speech is clear and goal oriented, follows commands Major Cranial nerves without deficit, no facial droop Moves extremities without ataxia, coordination intact  Psychiatric:        Behavior: Behavior normal.     ED Course/Procedures   Clinical Course as of Jun 25 1500  Thu Jun 26, 2019  0830 Ddimer, CT, Admit to hospitalist for cardiology eval   [BM]    Clinical Course User Index [BM] Deliah Boston, PA-C    Procedures  MDM  8:55 AM: Initial evaluation, patient is pleasant well-appearing no acute distress.  She has been updated on plan of care, D-dimer, CT scan that admission to hospitalist service and she is agreeable.  She is aware that her Covid test is positive she has no questions regarding this diagnosis.  Vital signs stable on room air. - D-dimer within normal limits, low suspicion for PE at this time, CT with contrast was ordered for further evaluation of the nodularity seen on chest x-ray yesterday.  Consult was  placed to hospitalist for admission.  I then spoke to the hospitalist Dr. Earnest Conroy who has accepted patient to her service for admission.  CT pending. - 11:05 AM: Patient reassessed resting comfortably no acute distress vital signs stable on room air no tachycardia or hypoxia.  She states understanding of care plan and is agreeable for admission.  She has no questions or concerns.  Case was discussed with Dr. Ralene Bathe during this visit.  Destiny Beasley was evaluated in Emergency Department on 06/26/2019 for the symptoms described in the history of present illness. She was evaluated in the context of the global COVID-19 pandemic, which necessitated consideration that the patient might be at risk for infection with the SARS-CoV-2 virus that causes  COVID-19. Institutional protocols and algorithms that pertain to the evaluation of patients at risk for COVID-19 are in a state of rapid change based on information released by regulatory bodies including the CDC and federal and state organizations. These policies and algorithms were followed during the patient's care in the ED. ----------------------------------------------------- ----------------------------------------------------- Addendum: I spoke to cardiology again, they now recommend that patient does not need admission and from a cardiology standpoint is cleared to go home.  It appears Dr. Davina Poke plans for close outpatient follow-up and outpatient stress test.  Cardiology note reviewed patient has been complaining of left leg pain I ordered a DVT study of the lower extremity for evaluation of this area.  On exam there is no evidence of cellulitis, DVT, septic arthritis, compartment syndrome or other emergent pathologies.  She is moving extremity well without evidence of pain do not feel any imaging is indicated at this time, she will follow-up with PCP.  CT Chest:  IMPRESSION:  1. Numerous bilateral predominantly ground-glass nodules throughout  the chest, largest in the right lower lobe measuring up to 4.4 cm.  Findings are compatible with the provided history of COVID-19  infection and related multifocal pneumonia. Follow-up to ensure  resolution.  2. Signs of previous percutaneous coronary intervention with  stenting along the circumflex artery.  3. Signs of hepatic steatosis.     VAS US DVT LLE:  Summary:    LEFT:  - There is no evidence of deep vein thrombosis in the lower extremity.    - No cystic structure found in the popliteal fossa.   Patient was then ambulated in the ED by nursing staff without hypoxia or difficulty and is requesting discharge.  As she has been cleared by cardiology and there is no indication to admit this patient for her Covid viral infection she  will be discharged to home with PCP follow-up.  Vital signs remained stable throughout ER visit.  I then called [facility] and left patient's information to schedule outpatient infusions at [phone].  Patient is agreeable to plan of care and requesting discharge home.  Note: Portions of this report may have been transcribed using voice recognition software. Every effort was made to ensure accuracy; however, inadvertent computerized transcription errors may still be present.   Deliah Boston, PA-C 06/26/19 1112    Deliah Boston, PA-C 06/26/19 1518    Quintella Reichert, MD 06/27/19 [phone]

## 2019-06-26 NOTE — Progress Notes (Signed)
Left lower extremity venous duplex completed. Refer to "CV Proc" under chart review to view preliminary results.  06/26/2019 2:06 PM Kelby Aline., MHA, RVT, RDCS, RDMS

## 2019-06-26 NOTE — ED Notes (Signed)
Pt is complaining of sob even thought she is stating at 100. She is placed on 2lpm by n/c

## 2019-06-26 NOTE — Progress Notes (Signed)
ANTICOAGULATION CONSULT NOTE - Initial Consult  Pharmacy Consult for IV heparin  Indication: chest pain/ACS  Allergies  Allergen Reactions  . Hydrocodone Other (See Comments)    Severe anxiety Confusion   . Invokana [Canagliflozin] Other (See Comments)    Constant yeast infections  . Imdur [Isosorbide Nitrate] Other (See Comments)    Patient reported wheezing  . Repatha [Evolocumab]     Muscle aches  . Rosuvastatin     myalgias  . Simvastatin     myalgias  . Atorvastatin Other (See Comments)    DIZZINESS    Patient Measurements:   Heparin Dosing Weight: 86 kg  Vital Signs: Temp: 98.8 F (37.1 C) (03/04 0241) Temp Source: Oral (03/04 0103) BP: 174/84 (03/04 0241) Pulse Rate: 92 (03/04 0241)  Labs: Recent Labs    06/25/19 1818 06/25/19 2050  HGB 14.1  --   HCT 43.4  --   PLT 283  --   CREATININE 0.97  --   TROPONINIHS 7 6    Estimated Creatinine Clearance: 87.4 mL/min (by C-G formula based on SCr of 0.97 mg/dL).   Medical History: Past Medical History:  Diagnosis Date  . Anemia   . Anxiety   . CAD in native artery    a. Inf STEMI 123XX123 complicated by cardiogenic shock/complete heart block s/p emergent cath showing culprit large dominant LCx s/p aspiration thrombectomy and DES, EF 50% by cath, 40-45% by echo.  . Chronic systolic heart failure ([place]) 06/25/2017   Echo 11/19: mild LVH, EF 40-45, inf-lat HK, Gr 1 DD, trivial MR  . Complete heart block, transient ([place])    a. 06/2017 in setting of acute inf MI -> resolved after PCI.  . Diabetes mellitus   . Goiter   . History of radiation therapy 06/14/2016 - 07/19/2016   Right Breast 50 Gy 25 fractions  . Hypercholesteremia   . Hypertension   . Ischemic cardiomyopathy    a. EF 50% by cath and 40-45% by echo 06/2017.  . Malignant neoplasm of upper-outer quadrant of right female breast ([place]) 02/24/2016  . Meningioma ([place])   . Nuclear stress test    Nuclear stress test 10/19: EF 35, inferolateral, apical  scar; inferior, apical inferior infarct with mild peri-infarct ischemia; high risk  . Obesity   . Personal history of chemotherapy   . Personal history of radiation therapy   . Pituitary tumor   . Seasonal asthma   . Sleep apnea    does not use every night  . Stroke [facility])    TIA - Dec 1 st, 2017    Medications:  Scheduled:  . aspirin  324 mg Oral Once   Infusions:  . nitroGLYCERIN      Assessment: 63 yoF sent by EMS to [facility] by cardiology. IV heparin for ACS. Baseline labs: H/H=14.1/43.4, plts = 283 aptt and INR pending  Goal of Therapy:  Heparin level 0.3-0.7 units/ml Monitor platelets by anticoagulation protocol: Yes   Plan:  Baseline aptt and INR stat Heparin 4000 unit IV bolus x1 now Start drip at 1100 units/hr Daily CBC/HL Check 1st HL in 6 hours  Destiny Beasley 06/26/2019,3:04 AM

## 2019-06-26 NOTE — ED Notes (Addendum)
Pt continues to be monitored. Resting in bed.

## 2019-06-26 NOTE — ED Notes (Signed)
Bed side toilet to pts bedside at this time. Pt assisted to Bedside

## 2019-06-26 NOTE — ED Notes (Signed)
Pt ambulated very well, no signs of distress, no complaints of dizziness, weakness, or unsteadiness. Pt stated she feels a lot better then before, her O2 stayed at 100% while ambulating back and forth.

## 2019-06-26 NOTE — Progress Notes (Signed)
  I connected by phone with Destiny Beasley on 06/26/2019 at 4:47 PM to discuss the potential use of an new treatment for mild to moderate COVID-19 viral infection in non-hospitalized patients.  This patient is a 64 y.o. female that meets the FDA criteria for Emergency Use Authorization of bamlanivimab or casirivimab\imdevimab.  Has a (+) direct SARS-CoV-2 viral test result  Has mild or moderate COVID-19   Is ? 64 years of age and weighs ? 40 kg  Is NOT hospitalized due to COVID-19  Is NOT requiring oxygen therapy or requiring an increase in baseline oxygen flow rate due to COVID-19  Is within 10 days of symptom onset  Has at least one of the high risk factor(s) for progression to severe COVID-19 and/or hospitalization as defined in EUA.  Specific high risk criteria : Cardiovascular Disease   I have spoken and communicated the following to the patient or parent/caregiver:  1. FDA has authorized the emergency use of bamlanivimab and casirivimab\imdevimab for the treatment of mild to moderate COVID-19 in adults and pediatric patients with positive results of direct SARS-CoV-2 viral testing who are 72 years of age and older weighing at least 40 kg, and who are at high risk for progressing to severe COVID-19 and/or hospitalization.  2. The significant known and potential risks and benefits of bamlanivimab and casirivimab\imdevimab, and the extent to which such potential risks and benefits are unknown.  3. Information on available alternative treatments and the risks and benefits of those alternatives, including clinical trials.  4. Patients treated with bamlanivimab and casirivimab\imdevimab should continue to self-isolate and use infection control measures (e.g., wear mask, isolate, social distance, avoid sharing personal items, clean and disinfect "high touch" surfaces, and frequent handwashing) according to CDC guidelines.   5. The patient or parent/caregiver has the option to accept or  refuse bamlanivimab or casirivimab\imdevimab .  After reviewing this information with the patient, The patient agreed to proceed with receiving the bamlanimivab infusion and will be provided a copy of the Fact sheet prior to receiving the infusion.   Sx onset 3/1. Infusion set up for tomorrow @ 10:30am. Directions given . [name] 06/26/2019 4:47 PM

## 2019-06-26 NOTE — ED Triage Notes (Signed)
Pt left [facility] to come here to be evaluated. Pt was sent by EMS to [facility] by cardiologist due to needing a heart cath per cardiology notes. Pt states she was seen in triage at [facility] and told to wait in the lobby. Pt reports she was tired of waiting and left.

## 2019-06-26 NOTE — ED Notes (Signed)
Pts calling ride at this time.

## 2019-06-26 NOTE — Progress Notes (Addendum)
Progress Note  Patient Name: Destiny Beasley Date of Encounter: 06/26/2019  Primary Cardiologist: Sherren Mocha, MD   Subjective   Pt is no longer having any chest pain or shortness of breath. On NTG drip. Her main complaint is intermittent pain in her left upper thigh/groin that comes on randomly, not with activity, and takes her breath away, also causes nausea and lightheadedness.   Covid 19 test came back positive while I was in the room examining her. She denies cough, fever. She did have shortness of breath with the substernal chest pain over the last few days.  Inpatient Medications    Scheduled Meds:  Continuous Infusions: . heparin 1,100 Units/hr (06/26/19 0636)  . nitroGLYCERIN 10 mcg/min (06/26/19 0636)   PRN Meds:    Vital Signs    Vitals:   06/26/19 0630 06/26/19 0645 06/26/19 0700 06/26/19 0715  BP: 127/74 122/74 129/69   Pulse: 80 78 79 79  Resp: 16 15 16 17   Temp:      SpO2: 100% 100% 100% 100%    Intake/Output Summary (Last 24 hours) at 06/26/2019 0739 Last data filed at 06/26/2019 0636 Gross per 24 hour  Intake 78.56 ml  Output --  Net 78.56 ml   Last 3 Weights 06/25/2019 06/06/2019 04/22/2019  Weight (lbs) 259 lb 266 lb 262 lb  Weight (kg) 117.482 kg 120.657 kg 118.842 kg      Telemetry    Sinus rhythm in the 80's-90's with occ PVCs - Personally Reviewed  ECG    Sinus rhythm with inferolateral slight T wave inversions- Personally Reviewed  Physical Exam   GEN: No acute distress.   Neck: No JVD Cardiac: RRR, no murmurs, rubs, or gallops.  Respiratory: Clear to auscultation bilaterally. GI: Soft, nontender, non-distended  MS: No edema; No deformity.  Neuro:  Nonfocal  Psych: Normal affect   Labs    High Sensitivity Troponin:   Recent Labs  Lab 06/25/19 1818 06/25/19 2050 06/26/19 0312  TROPONINIHS 7 6 7       Chemistry Recent Labs  Lab 06/25/19 1818  NA 135  K 4.4  CL 100  CO2 24  GLUCOSE 290*  BUN 15  CREATININE 0.97   CALCIUM 9.1  GFRNONAA >60  GFRAA >60  ANIONGAP 11     Hematology Recent Labs  Lab 06/25/19 1818  WBC 4.5  RBC 4.71  HGB 14.1  HCT 43.4  MCV 92.1  MCH 29.9  MCHC 32.5  RDW 14.1  PLT 283    BNPNo results for input(s): BNP, PROBNP in the last 168 hours.   DDimer No results for input(s): DDIMER in the last 168 hours.   Radiology    DG Chest 2 View  Result Date: 06/25/2019 CLINICAL DATA:  Chest pain.  Dyspnea. EXAM: CHEST - 2 VIEW COMPARISON:  July 02, 2018. FINDINGS: Normal cardiomediastinal silhouette. Diffusely increased interstitial markings without overt interstitial edema or pleural effusion or hyperinflation. A new soft tissue nodularity projecting on the left infrahilar region measuring 33 x 25 mm. Surgical clips in the ventral right hemithorax. No pneumothorax. Mild skeletal degenerative change. PRA note in progress for additional imaging. IMPRESSION: A new left infrahilar nodular soft tissue density since January 2021, possibly focal infection. Further characterization with unenhanced CT chest recommended with additional unenhanced CT chest in 3 months to document resolution and exclude an underlying pulmonary nodularity. Electronically Signed   By: Revonda Humphrey   On: 06/25/2019 19:12    Cardiac Studies   Echocardiogram 09/05/2018  EF 40-45, inf-lat AK, mild LVH, Gr 1 DD, normal RVSF  Echo 03/07/18 Mild LVH, EF 40-45, GLS -11%, inf-lat HK, Gr 1 DD, trivial MR  Nuclear stress test10/22/19 EF 35, inferolateral, apical scar; inferior, apical inferior infarct with mild peri-infarct ischemia; high risk  Echo 06/26/17 EF 40-45, inferolateral hypokinesis, mild MR  Cardiac catheterization 06/25/17 LM luminal irregularities LAD luminal irregularities, proximal 30 LCx proximal 100 RCA irregularities EF 45-50, inferolateral akinesis PCI: 4 x 20 mm Synergy DES to the proximal LCx  Echo 03/25/16 Moderate LVH, EF 60-65, normal wall motion  Carotid US  12/17 Bilateral ICA 1-39  Nuclear stress test 7/13 1. Negative for pharmacologic-stress induced ischemia. 2. Left ventricular ejection fraction 65%.   Patient Profile     64 y.o. female with a history of CAD s/p inferolateral STEMI in 06/2017 treated with DES to the left circumflex, ischemic cardiomyopathy with EF 40-45%, diabetes type 2, prior TIA, OSA, breast cancer, hypertension and hyperlipidemia who was seen in the office yesterday with complaints of chest pain consistent with unstable angina-substernal chest pain with associated shortness of breath, nausea and diaphoresis.  She was sent to the hospital with plans for cardiac catheterization.  Patient initially went to [facility], however she left due to the long wait and went to [facility] long hospital.  Assessment & Plan    CAD with possible unstable angina -History of inferior MI in 123XX123 complicated by cardiogenic shock, complete heart block and acute CHF.  She was treated with DES to the circumflex. -Patient was seen in the office yesterday with complaints of chest pain consistent with unstable angina.  She was sent to the hospital via ambulance with plan for cardiac catheterization to be done today. -She is currently on a heparin drip and nitroglycerin drip.  She continues on aspirin, carvedilol and statin. -High-sensitivity troponins have remained low: 7, 6, 7 -No chest pain or shortness of breath overnight, on heparin and NTG drip. .  -She has just tested positive for COVID 19. With no significant ischemic symptoms, troponins negative and no acute ischemic changes on EKG, we will cancel cardiac cath for today.  -ED physician is working pt up for possible PE and whether pt needs to be admitted. Currently she does not meet criteria for COVID admission and treatment.  -If negative for PE, we would need to consider whether to keep her for 24 hours of heparin and echocardiogram. I will discuss with Dr. Audie Box.  -Addendum- pt  with no objective evidence of myocardial ischemia. Currently chest pain free. She is OK to be discharged from cardiac perspective and we will arrange for follow up in our office in 2 weeks if asymptomatic from [place].   Chronic systolic heart failure -EF 40-45% by echocardiogram 08/2018.  -Currently volume stable. -Medical therapy includes carvedilol and irbesartan.  She was intolerant of spironolactone in the past.  Essential hypertension -Treatment includes amlodipine, irbesartan and carvedilol. -Blood pressure was initially elevated but now has improved on nitroglycerin drip. -We will resume home carvedilol and amlodipine.  Plan to resume ARB once nitroglycerin drip is off and blood pressure tolerates.  Hyperlipidemia -Patient has a history of intolerance to statins and PCSK9 inhibitors.  She is currently on low-dose pravastatin. -Direct LDL was 87 when checked last month.  Continue current therapy.  Diabetes type 2, on insulin -Hemoglobin A1c is 9.3, poorly controlled. -We will initiate sliding scale insulin  Left leg pain - intermittent pain in her left upper thigh/groin that comes on  randomly, not with activity, and takes her breath away, also causes nausea and lightheadedness.  -Unclear etiology.    Lung nodule -Chest x-ray shows new left infrahilar nodular soft tissue density since January 2021, possibly focal infection.  Recommendation for further characterization with unenhanced CT chest recommended with additional unenhanced CT chest in 3 months to document resolution and exclude an underlying pulmonary nodularity. -ED MD will check D-Dimer and if negative can do non contrast CT, if positive will do CTA.   For questions or updates, please contact [place] Please consult [email or web address] for contact info under        Signed, Daune Perch, NP  06/26/2019, 7:39 AM

## 2019-06-26 NOTE — ED Provider Notes (Signed)
TIME SEEN: 2:52 AM  CHIEF COMPLAINT: Chest tightness, shortness of breath  HPI: Patient is a 64 year old female with history of CAD status post thrombectomy and drug-eluting stent to the left circumflex in 2019 who presents to the [facility] long emergency department chest pain.  Was seen in her cardiology office yesterday afternoon and was sent by ambulance to [facility] for admission for elective cath.  States that she waited in the lobby and became tired of waiting so she presents on her own to the [facility] emergency department.  Still having active left-sided chest pressure and shortness of breath.  No diaphoresis or nausea, vomiting currently.  She has had a negative troponin yesterday.  ROS: See HPI Constitutional: no fever  Eyes: no drainage  ENT: no runny nose   Cardiovascular:   chest pain  Resp:  SOB  GI: no vomiting GU: no dysuria Integumentary: no rash  Allergy: no hives  Musculoskeletal: no leg swelling  Neurological: no slurred speech ROS otherwise negative  PAST MEDICAL HISTORY/PAST SURGICAL HISTORY:  Past Medical History:  Diagnosis Date  . Anemia   . Anxiety   . CAD in native artery    a. Inf STEMI 123XX123 complicated by cardiogenic shock/complete heart block s/p emergent cath showing culprit large dominant LCx s/p aspiration thrombectomy and DES, EF 50% by cath, 40-45% by echo.  . Chronic systolic heart failure ([place]) 06/25/2017   Echo 11/19: mild LVH, EF 40-45, inf-lat HK, Gr 1 DD, trivial MR  . Complete heart block, transient ([place])    a. 06/2017 in setting of acute inf MI -> resolved after PCI.  . Diabetes mellitus   . Goiter   . History of radiation therapy 06/14/2016 - 07/19/2016   Right Breast 50 Gy 25 fractions  . Hypercholesteremia   . Hypertension   . Ischemic cardiomyopathy    a. EF 50% by cath and 40-45% by echo 06/2017.  . Malignant neoplasm of upper-outer quadrant of right female breast ([place]) 02/24/2016  . Meningioma ([place])   . Nuclear stress test    Nuclear stress test 10/19: EF 35, inferolateral, apical scar; inferior, apical inferior infarct with mild peri-infarct ischemia; high risk  . Obesity   . Personal history of chemotherapy   . Personal history of radiation therapy   . Pituitary tumor   . Seasonal asthma   . Sleep apnea    does not use every night  . Stroke [facility])    TIA - Dec 1 st, 2017    MEDICATIONS:  Prior to Admission medications   Medication Sig Start Date End Date Taking? Authorizing Provider  amLODipine (NORVASC) 5 MG tablet Take 5 mg by mouth daily.    [provider]  aspirin EC 81 MG tablet Take 1 tablet (81 mg total) by mouth daily. 01/01/19   Richardson Dopp T, PA-C  carvedilol (COREG) 12.5 MG tablet Take 1 tablet (12.5 mg total) by mouth 2 (two) times daily. 01/01/19 05/15/28  Richardson Dopp T, PA-C  Cholecalciferol (VITAMIN D-3 PO) Take 1 tablet by mouth daily.     [provider]  Continuous Blood Gluc Transmit (DEXCOM G6 TRANSMITTER) MISC 1 each by Does not apply route daily. 03/27/18   Elayne Snare, MD  furosemide (LASIX) 20 MG tablet Take 1 tablet (20 mg total) by mouth daily. 03/31/19   Sherren Mocha, MD  insulin glargine (LANTUS) 100 UNIT/ML injection Inject 60 Units into the skin daily.    [provider]  insulin lispro (HUMALOG KWIKPEN) 100  UNIT/ML KwikPen Inject 20 Units into the skin in the morning and at bedtime. Inject 20 units under the skin before lunch and dinner.    [provider]  irbesartan (AVAPRO) 300 MG tablet Take 300 mg by mouth daily.    [provider]  MULTIPLE VITAMIN PO Take 1 tablet daily by mouth.    [provider]  nitroGLYCERIN (NITROSTAT) 0.4 MG SL tablet Place 1 tablet (0.4 mg total) under the tongue every 5 (five) minutes as needed for chest pain. 06/24/19 09/22/19  Richardson Dopp T, PA-C  pravastatin (PRAVACHOL) 20 MG tablet Take 20 mg by mouth once a week.    [provider]  Semaglutide (RYBELSUS) 14 MG TABS Take 1 tablet  by mouth daily before breakfast. 06/06/19   Elayne Snare, MD  Venlafaxine HCl 150 MG TB24 Take 150 mg by mouth daily as needed (for nerves).     [provider]    ALLERGIES:  Allergies  Allergen Reactions  . Hydrocodone Other (See Comments)    Severe anxiety Confusion   . Invokana [Canagliflozin] Other (See Comments)    Constant yeast infections  . Imdur [Isosorbide Nitrate] Other (See Comments)    Patient reported wheezing  . Repatha [Evolocumab]     Muscle aches  . Rosuvastatin     myalgias  . Simvastatin     myalgias  . Atorvastatin Other (See Comments)    DIZZINESS    SOCIAL HISTORY:  Social History   Tobacco Use  . Smoking status: Never Smoker  . Smokeless tobacco: Never Used  Substance Use Topics  . Alcohol use: Yes    Alcohol/week: 1.0 standard drinks    Types: 1 Glasses of wine per week    Comment: 1 glass monthly    FAMILY HISTORY: Family History  Problem Relation Age of Onset  . Diabetes Mother   . Hyperlipidemia Mother   . Hypertension Mother     EXAM: BP (!) 174/84   Pulse 92   Temp 98.8 F (37.1 C)   Resp 17   SpO2 100%  CONSTITUTIONAL: Alert and oriented and responds appropriately to questions. Well-appearing; well-nourished HEAD: Normocephalic EYES: Conjunctivae clear, pupils appear equal, EOM appear intact ENT: normal nose; moist mucous membranes NECK: Supple, normal ROM CARD: RRR; S1 and S2 appreciated; no murmurs, no clicks, no rubs, no gallops RESP: Normal chest excursion without splinting or tachypnea; breath sounds clear and equal bilaterally; no wheezes, no rhonchi, no rales, no hypoxia or respiratory distress, speaking full sentences ABD/GI: Normal bowel sounds; non-distended; soft, non-tender, no rebound, no guarding, no peritoneal signs, no hepatosplenomegaly BACK:  The back appears normal EXT: Normal ROM in all joints; no deformity noted, no edema; no cyanosis SKIN: Normal color for age and race; warm; no rash on exposed  skin NEURO: Moves all extremities equally PSYCH: The patient's mood and manner are appropriate.   MEDICAL DECISION MAKING: Patient here with active chest pain.  EKG shows no new ischemic change.  Scheduled for elective cath tomorrow and was to be admitted by the cardiology service but sent to the ED instead of being a direct admission.  States she got tired of waiting in the lobby at [facility] and came here instead.  She is aware that she will need to be transferred back to [facility] for admission for catheterization in the morning.  We will repeat troponin, obtain rapid Covid swab for procedure this morning.  Will give aspirin and start heparin and nitroglycerin per cardiology  H&P.  Will discuss with cardiologist on-call to place admission orders.  ED PROGRESS: Spoke with Dr. Jeannette Corpus, cardiology fellow on call.  He requests I place admission orders for inpatient, SDU.    I reviewed all nursing notes and pertinent previous records as available.  I have interpreted any EKGs, lab and urine results, imaging (as available).     EKG Interpretation  Date/Time:  Thursday June 26 2019 02:48:32 EST Ventricular Rate:  92 PR Interval:    QRS Duration: 92 QT Interval:  358 QTC Calculation: 443 R Axis:   48 Text Interpretation: Sinus rhythm Inferolateral infarct, old No significant change since last tracing Confirmed by Denham Mose, Cyril Mourning [phone]) on 06/26/2019 2:51:45 AM        CRITICAL CARE Performed by: Cyril Mourning Eberardo Demello   Total critical care time: 45 minutes  Critical care time was exclusive of separately billable procedures and treating other patients.  Critical care was necessary to treat or prevent imminent or life-threatening deterioration.  Critical care was time spent personally by me on the following activities: development of treatment plan with patient and/or surrogate as well as nursing, discussions with consultants, evaluation of patient's response to treatment, examination of patient,  obtaining history from patient or surrogate, ordering and performing treatments and interventions, ordering and review of laboratory studies, ordering and review of radiographic studies, pulse oximetry and re-evaluation of patient's condition.   Destiny Beasley was evaluated in Emergency Department on 06/26/2019 for the symptoms described in the history of present illness. She was evaluated in the context of the global COVID-19 pandemic, which necessitated consideration that the patient might be at risk for infection with the SARS-CoV-2 virus that causes COVID-19. Institutional protocols and algorithms that pertain to the evaluation of patients at risk for COVID-19 are in a state of rapid change based on information released by regulatory bodies including the CDC and federal and state organizations. These policies and algorithms were followed during the patient's care in the ED.  Patient was seen wearing N95, face shield, gloves.    Giara Mcgaughey, Delice Bison, DO 06/26/19 [phone]

## 2019-06-26 NOTE — ED Notes (Signed)
Pt has decided to leave. She is ask to stay but she does not want to

## 2019-06-26 NOTE — Discharge Instructions (Signed)
You have been diagnosed today with COVID-19 viral infection.  At this time there does not appear to be the presence of an emergent medical condition, however there is always the potential for conditions to change. Please read and follow the below instructions.  Please return to the Emergency Department immediately for any new or worsening symptoms. Please be sure to follow up with your Primary Care Provider within one week regarding your visit today; please call their office to schedule an appointment even if you are feeling better for a follow-up visit. Your Covid test is positive.  You will be contacted for Covid home management by [facility] health for further care, you will be scheduled for outpatient infusions to treat COVID-19.  Please drink plenty of water and get plenty of rest. You are given a blood thinner today in the emergency department.  If you have any injury or hit your head you must immediately return to the emergency department for reevaluation no matter how minor the injury it is as your risk of bleeding is elevated. Your CT scan today showed nodules consistent with COVID-19.  There is one nodule measuring 4.4 cm in size.  Additionally it showed signs of fatty liver disease.  Please discuss these findings in their entirety with your primary care provider at your follow-up visit.  Get help right away if: You have trouble breathing. You have pain or pressure in your chest. You have confusion. You have bluish lips and fingernails. You have difficulty waking from sleep. You have symptoms that get worse. You have any new/concerning or worsening of symptoms  Please read the additional information packets attached to your discharge summary.  Do not take your medicine if  develop an itchy rash, swelling in your mouth or lips, or difficulty breathing; call 911 and seek immediate emergency medical attention if this occurs.  Note: Portions of this text may have been transcribed using voice  recognition software. Every effort was made to ensure accuracy; however, inadvertent computerized transcription errors may still be present.

## 2019-06-26 NOTE — ED Notes (Signed)
Pt resting in bed at this time. Pt updated on plan of care.

## 2019-06-26 NOTE — Telephone Encounter (Signed)
Pt is currently admitted to [facility].

## 2019-06-27 ENCOUNTER — Encounter (HOSPITAL_COMMUNITY): Payer: Self-pay

## 2019-06-27 ENCOUNTER — Ambulatory Visit (HOSPITAL_COMMUNITY)
Admission: RE | Admit: 2019-06-27 | Discharge: 2019-06-27 | Disposition: A | Discharge status: Home / Self Care | Payer: 59 | Source: Ambulatory Visit | Attending: Pulmonary Disease | Admitting: Pulmonary Disease

## 2019-06-27 DIAGNOSIS — U071 COVID-19: Secondary | ICD-10-CM

## 2019-06-27 DIAGNOSIS — I25110 Atherosclerotic heart disease of native coronary artery with unstable angina pectoris: Secondary | ICD-10-CM | POA: Diagnosis present

## 2019-06-27 DIAGNOSIS — I5022 Chronic systolic (congestive) heart failure: Secondary | ICD-10-CM | POA: Insufficient documentation

## 2019-06-27 DIAGNOSIS — E1165 Type 2 diabetes mellitus with hyperglycemia: Secondary | ICD-10-CM

## 2019-06-27 MED ORDER — SODIUM CHLORIDE 0.9 % IV SOLN: INTRAVENOUS | Status: DC | PRN

## 2019-06-27 MED ORDER — EPINEPHRINE 0.3 MG/0.3ML IJ SOAJ: 0.3000 mg | Freq: Once | INTRAMUSCULAR | Status: DC | PRN

## 2019-06-27 MED ORDER — FAMOTIDINE IN NACL 20-0.9 MG/50ML-% IV SOLN: 20.0000 mg | Freq: Once | INTRAVENOUS | Status: DC | PRN

## 2019-06-27 MED ORDER — DIPHENHYDRAMINE HCL 50 MG/ML IJ SOLN: 50.0000 mg | Freq: Once | INTRAMUSCULAR | Status: DC | PRN

## 2019-06-27 MED ORDER — ALBUTEROL SULFATE HFA 108 (90 BASE) MCG/ACT IN AERS: 2.0000 | INHALATION_SPRAY | Freq: Once | RESPIRATORY_TRACT | Status: DC | PRN

## 2019-06-27 MED ORDER — METHYLPREDNISOLONE SODIUM SUCC 125 MG IJ SOLR: 125.0000 mg | Freq: Once | INTRAMUSCULAR | Status: DC | PRN

## 2019-06-27 NOTE — Discharge Instructions (Signed)

## 2019-06-27 NOTE — Progress Notes (Signed)
  Diagnosis: COVID-19  Physician: Dr. Joya Gaskins  Procedure: Covid Infusion Clinic Med: bamlanivimab infusion - Provided patient with bamlanimivab fact sheet for patients, parents and caregivers prior to infusion.  Complications: No immediate complications noted.  Discharge: Discharged home   Acquanetta Chain 06/27/2019

## 2019-07-07 NOTE — Telephone Encounter (Signed)
Yes.  Virtual visit is ok. Richardson Dopp, PA-C    07/07/2019 5:09 PM

## 2019-07-09 ENCOUNTER — Ambulatory Visit: Payer: 59 | Admitting: Physician Assistant

## 2019-07-09 ENCOUNTER — Telehealth: Payer: 59 | Admitting: Physician Assistant

## 2019-07-10 NOTE — Progress Notes (Addendum)
Virtual Visit via Video Note   This visit type was conducted due to national recommendations for restrictions regarding the COVID-19 Pandemic (e.g. social distancing) in an effort to limit this patient's exposure and mitigate transmission in our community.  Due to her co-morbid illnesses, this patient is at least at moderate risk for complications without adequate follow up.  This format is felt to be most appropriate for this patient at this time.  All issues noted in this document were discussed and addressed.  A limited physical exam was performed with this format.  Please refer to the patient's chart for her consent to telehealth for [facility].   The patient was identified using 2 identifiers.  Date:  07/11/2019   ID:  Destiny Beasley, DOB 1956/01/13, MRN [id number]  Patient Location: Home Provider Location: Office  PCP:  Lawerance Cruel, MD  Cardiologist:  Sherren Mocha, MD   Electrophysiologist:  None   Evaluation Performed:  Follow-Up Visit  Chief Complaint:  Follow-up (CAD, chest pain)    Patient profile:    Destiny Beasley is a 64 y.o. female with    Coronary artery disease  ? S/p inf-lat STEMI in 06/2017 c/b CGS, acute HFrEF, CHB >>PCI: DES to pLCx ? Myoview 10/19: inferior scar, mild peri-infarct ischemia  Chronic Systolic CHF ? Ischemic CM ? Echocardiogram 3/19: EF 40-45 ? Echocardiogram 11/19: EF 40-45 ? Echocardiogram 08/2018: EF 40-45  Diabetes mellitus   Prior TIA  OSA  Breast CA   Hypertension   Hyperlipidemia  Prior cardiac studies: Echocardiogram 09/05/2018 EF 40-45, inf-lat AK, mild LVH, Gr 1 DD, normal RVSF  Echo 03/07/18 Mild LVH, EF 40-45, GLS -11%, inf-lat HK, Gr 1 DD, trivial MR  Nuclear stress test10/22/19 EF 35, inferolateral, apical scar; inferior, apical inferior infarct with mild peri-infarct ischemia; high risk  Echo 06/26/17 EF 40-45, inferolateral hypokinesis, mild MR  Cardiac catheterization 06/25/17 LM luminal  irregularities LAD luminal irregularities, proximal 30 LCx proximal 100 RCA irregularities EF 45-50, inferolateral akinesis PCI: 4 x 20 mm Synergy DES to the proximal LCx  Echo 03/25/16 Moderate LVH, EF 60-65, normal wall motion  Carotid US 12/17 Bilateral ICA 1-39  Nuclear stress test 7/13 1. Negative for pharmacologic-stress induced ischemia. 2. Left ventricular ejection fraction 65%.   History of present illness: The patient was last seen in clinic 06/25/2019 with complaints of chest discomfort.  Her symptoms were concerning for unstable angina and she was admitted to the hospital.  She was transported via ambulance due to ongoing chest pain in the office.  She went to [facility] emergency room.  She ended up leaving the emergency room due to prolonged wait time and went to [facility].  Her SARS-CoV-2 test returned positive.  Her high-sensitivity troponin levels remained normal.  D-dimer was negative.  Chest CT demonstrated numerous bilateral groundglass nodules consistent with multifocal pneumonia due to COVID-19.  Left leg venous duplex was negative for DVT.  She was reevaluated by cardiology in the emergency room.  Given her negative cardiac enzymes, it was not felt that she needed inpatient work-up.  The ER physician felt she was stable to be discharged to home for outpatient management.  She has undergone monoclonal antibody infusion.  Today, she notes she is doing well.  After treatment for COVID-19, her chest pain resolved.  She is feeling well without chest discomfort, significant shortness of breath, orthopnea, lower extremity swelling, syncope or near syncope.  She has no further leg pain.  Past Medical History:  Diagnosis Date  . Anemia   . Anxiety   . CAD in native artery    a. Inf STEMI 123XX123 complicated by cardiogenic shock/complete heart block s/p emergent cath showing culprit large dominant LCx s/p aspiration thrombectomy and DES, EF 50% by cath,  40-45% by echo.  . Chronic systolic heart failure ([place]) 06/25/2017   Echo 11/19: mild LVH, EF 40-45, inf-lat HK, Gr 1 DD, trivial MR  . Complete heart block, transient ([place])    a. 06/2017 in setting of acute inf MI -> resolved after PCI.  . Diabetes mellitus   . Goiter   . History of radiation therapy 06/14/2016 - 07/19/2016   Right Breast 50 Gy 25 fractions  . Hypercholesteremia   . Hypertension   . Ischemic cardiomyopathy    a. EF 50% by cath and 40-45% by echo 06/2017.  . Malignant neoplasm of upper-outer quadrant of right female breast ([place]) 02/24/2016  . Meningioma ([place])   . Nuclear stress test    Nuclear stress test 10/19: EF 35, inferolateral, apical scar; inferior, apical inferior infarct with mild peri-infarct ischemia; high risk  . Obesity   . Personal history of chemotherapy   . Personal history of radiation therapy   . Pituitary tumor   . Seasonal asthma   . Sleep apnea    does not use every night  . Stroke [facility])    TIA - Dec 1 st, 2017      Current Meds  Medication Sig  . Ascorbic Acid (VITAMIN C) 1000 MG tablet Take 1,000 mg by mouth daily.  Marland Kitchen aspirin EC 81 MG tablet Take 1 tablet (81 mg total) by mouth daily.  . carvedilol (COREG) 12.5 MG tablet Take 1.5 tablets (18.75 mg total) by mouth 2 (two) times daily.  . Cholecalciferol (VITAMIN D-3 PO) Take 1 tablet by mouth daily.   . Continuous Blood Gluc Transmit (DEXCOM G6 TRANSMITTER) MISC 1 each by Does not apply route daily.  . furosemide (LASIX) 20 MG tablet Take 1 tablet (20 mg total) by mouth daily.  . insulin glargine (LANTUS) 100 UNIT/ML injection Inject 60 Units into the skin daily.  . insulin lispro (HUMALOG KWIKPEN) 100 UNIT/ML KwikPen Inject 20 Units into the skin See admin instructions. Inject 20 units under the skin before lunch and dinner.   . irbesartan (AVAPRO) 300 MG tablet Take 300 mg by mouth daily.  . MULTIPLE VITAMIN PO Take 1 tablet daily by mouth.  . nitroGLYCERIN (NITROSTAT) 0.4 MG SL tablet  Place 1 tablet (0.4 mg total) under the tongue every 5 (five) minutes as needed for chest pain.  . pravastatin (PRAVACHOL) 20 MG tablet Take 20 mg by mouth once a week. Friday  . Semaglutide (RYBELSUS) 14 MG TABS Take 1 tablet by mouth daily before breakfast.  . Venlafaxine HCl 150 MG TB24 Take 150 mg by mouth daily.   . vitamin B-12 (CYANOCOBALAMIN) 100 MCG tablet Take 100 mcg by mouth daily.  . [DISCONTINUED] amLODipine (NORVASC) 5 MG tablet Take 5 mg by mouth daily.  . [DISCONTINUED] Biotin 5000 MCG TABS Take 1 tablet by mouth daily.  . [DISCONTINUED] carvedilol (COREG) 12.5 MG tablet Take 1 tablet (12.5 mg total) by mouth 2 (two) times daily.     Allergies:   Hydrocodone, Invokana [canagliflozin], Imdur [isosorbide nitrate], Repatha [evolocumab], Rosuvastatin, Simvastatin, and Atorvastatin   Social History   Tobacco Use  . Smoking status: Never Smoker  . Smokeless tobacco: Never Used  Substance Use Topics  .  Alcohol use: Yes    Alcohol/week: 1.0 standard drinks    Types: 1 Glasses of wine per week    Comment: 1 glass monthly  . Drug use: No     Family Hx: The patient's family history includes Diabetes in her mother; Hyperlipidemia in her mother; Hypertension in her mother.  ROS:   Please see the history of present illness.      Labs/Other Tests and Data Reviewed:    EKG:  No ECG reviewed.  Recent Labs: 09/05/2018: Magnesium 1.8 06/25/2019: BUN 15; Creatinine, Ser 0.97; Hemoglobin 14.1; Platelets 283; Potassium 4.4; Sodium 135   Recent Lipid Panel Lab Results  Component Value Date/Time   CHOL 223 (H) 04/23/2019 07:14 AM   TRIG 138 04/23/2019 07:14 AM   HDL 57 04/23/2019 07:14 AM   CHOLHDL 3.9 04/23/2019 07:14 AM   CHOLHDL 2 08/12/2018 09:44 AM   LDLCALC 141 (H) 04/23/2019 07:14 AM   LDLDIRECT 87.0 06/04/2019 08:35 AM    Wt Readings from Last 3 Encounters:  07/11/19 263 lb 9.6 oz (119.6 kg)  06/25/19 259 lb (117.5 kg)  06/06/19 266 lb (120.7 kg)     Objective:     Vital Signs:  BP (!) 151/85 (BP Location: Right Arm)   Temp 97.6 F (36.4 C)   Ht 6' (1.829 m)   Wt 263 lb 9.6 oz (119.6 kg)   BMI 35.75 kg/m    VITAL SIGNS:  reviewed GEN:  no acute distress EYES:  sclerae anicteric, EOMI - Extraocular Movements Intact RESPIRATORY:  Normal respiratory effort NEURO:  alert and oriented x 3, no obvious focal deficit PSYCH:  normal affect  ASSESSMENT & PLAN:    1. Coronary artery disease involving native coronary artery of native heart without angina pectoris History of inferolateral STEMI in March 2019 treated with DES to the LCx.  She recently presented to the office with chest discomfort was concerning for unstable angina.  However, she was diagnosed with COVID-19.  Her high-sensitivity troponins remained negative.  Her symptoms have completely resolved.  Therefore, I believe her chest discomfort was all related to COVID-19.  No further testing is needed at this time.  Continue aspirin, carvedilol, pravastatin.  2. Chronic systolic heart failure (HCC) EF 40-45.  Ischemic cardiomyopathy.  NYHA II.  Volume status seems to be stable.  Continue beta-blocker, ARB, furosemide.  She has an intolerance to spironolactone.  If her blood pressure remains uncontrolled, I will add hydralazine to her medical regimen.  3. Essential hypertension Blood pressure is above target.  Increase carvedilol to 18.7 mg twice daily.  If blood pressure remains above target, add hydralazine.  4. COVID-19 Her symptoms have completely resolved.  She did complete monoclonal antibody therapy.  Of note, she had a CT scan in the hospital that demonstrated groundglass nodules and findings consistent with COVID-19 pneumonia.  She will follow up with primary care for further testing as indicated.   Time:   Today, I have spent 10 minutes with the patient with telehealth technology discussing the above problems.     Medication Adjustments/Labs and Tests Ordered: Current medicines  are reviewed at length with the patient today.  Concerns regarding medicines are outlined above.   Tests Ordered: No orders of the defined types were placed in this encounter.   Medication Changes: Meds ordered this encounter  Medications  . carvedilol (COREG) 12.5 MG tablet    Sig: Take 1.5 tablets (18.75 mg total) by mouth 2 (two) times daily.    Dispense:  270 tablet    Refill:  3  . ezetimibe (ZETIA) 10 MG tablet    Sig: Take 1 tablet (10 mg total) by mouth daily.    Dispense:  90 tablet    Refill:  3  . amLODipine (NORVASC) 10 MG tablet    Sig: Take 1 tablet (10 mg total) by mouth daily.    Dispense:  180 tablet    Refill:  3    Follow Up:  In Person in 3 month(s)  Signed, Richardson Dopp, PA-C  07/11/2019 8:45 AM    [place]

## 2019-07-11 ENCOUNTER — Telehealth (INDEPENDENT_AMBULATORY_CARE_PROVIDER_SITE_OTHER): Payer: 59 | Admitting: Physician Assistant

## 2019-07-11 ENCOUNTER — Other Ambulatory Visit: Payer: Self-pay

## 2019-07-11 ENCOUNTER — Encounter: Payer: Self-pay | Admitting: Physician Assistant

## 2019-07-11 ENCOUNTER — Ambulatory Visit: Payer: 59 | Admitting: Physician Assistant

## 2019-07-11 VITALS — BP 151/85 | Temp 97.6°F | Ht 72.0 in | Wt 263.6 lb

## 2019-07-11 DIAGNOSIS — I110 Hypertensive heart disease with heart failure: Secondary | ICD-10-CM | POA: Diagnosis not present

## 2019-07-11 DIAGNOSIS — I2510 Atherosclerotic heart disease of native coronary artery without angina pectoris: Secondary | ICD-10-CM

## 2019-07-11 DIAGNOSIS — Z8616 Personal history of COVID-19: Secondary | ICD-10-CM | POA: Diagnosis not present

## 2019-07-11 DIAGNOSIS — I5022 Chronic systolic (congestive) heart failure: Secondary | ICD-10-CM | POA: Diagnosis not present

## 2019-07-11 DIAGNOSIS — I10 Essential (primary) hypertension: Secondary | ICD-10-CM

## 2019-07-11 DIAGNOSIS — U071 COVID-19: Secondary | ICD-10-CM

## 2019-07-11 MED ORDER — EZETIMIBE 10 MG PO TABS: 10.0000 mg | ORAL_TABLET | Freq: Every day | ORAL | 3 refills | Status: DC

## 2019-07-11 MED ORDER — CARVEDILOL 12.5 MG PO TABS: 18.7500 mg | ORAL_TABLET | Freq: Two times a day (BID) | ORAL | 3 refills | Status: DC

## 2019-07-11 MED ORDER — AMLODIPINE BESYLATE 10 MG PO TABS: 10.0000 mg | ORAL_TABLET | Freq: Every day | ORAL | 3 refills | Status: DC

## 2019-07-11 NOTE — Patient Instructions (Signed)
Medication Instructions:   Your physician has recommended you make the following change in your medication:   1) Start Zetia 10 mg, 1 tablet by mouth once a day 2) Increase Carvedilol to 18.75 mg, 1.5 tablets by mouth twice a day 3) Increase Amlodipine to 10 mg, 1 tablet by mouth once a day  *If you need a refill on your cardiac medications before your next appointment, please call your pharmacy*  Lab Work:  Your physician recommends that you return for lab work on 10/13/19 after office visit with Richardson Dopp, make sure you are fasting.  If you have labs (blood work) drawn today and your tests are completely normal, you will receive your results only by: Marland Kitchen MyChart Message (if you have MyChart) OR . A paper copy in the mail If you have any lab test that is abnormal or we need to change your treatment, we will call you to review the results.  Testing/Procedures:  None ordered today  Follow-Up: At [facility], you and your health needs are our priority.  As part of our continuing mission to provide you with exceptional heart care, we have created designated Provider Care Teams.  These Care Teams include your primary Cardiologist (physician) and Advanced Practice Providers (APPs -  Physician Assistants and Nurse Practitioners) who all work together to provide you with the care you need, when you need it.  We recommend signing up for the patient portal called "MyChart".  Sign up information is provided on this After Visit Summary.  MyChart is used to connect with patients for Virtual Visits (Telemedicine).  Patients are able to view lab/test results, encounter notes, upcoming appointments, etc.  Non-urgent messages can be sent to your provider as well.   To learn more about what you can do with MyChart, go to [email or web address].    Your next appointment:    On 10/13/19 at 8:15AM with Richardson Dopp, PA-C  Other Instructions  Once you increase your Carvedilol for one week, take  blood pressure once a day for one week and send readings through mychart.

## 2019-07-18 MED ORDER — HYDRALAZINE HCL 25 MG PO TABS: 25.0000 mg | ORAL_TABLET | Freq: Three times a day (TID) | ORAL | 11 refills | Status: DC

## 2019-07-18 NOTE — Telephone Encounter (Signed)
Pt made aware of recommendation per Richardson Dopp PA-C, for her to start medication hydralazine 25 mg po TID, continue to check her BP daily and log this, and Scott said if BP remains >130/80, she should call our office to make [place] aware of this.  Informed the pt that Nicki Reaper already sent her hydralazine into her pharmacy. Pt verbalized understanding and agrees with this plan.

## 2019-07-18 NOTE — Telephone Encounter (Signed)
Please call patient. PLAN:  1. Start Hydralazine 25 mg three times a day.  I sent med to her pharmacy already. 2. Keep an eye on BP.  Call if BP remains > 130/80. Richardson Dopp, PA-C    07/18/2019 11:32 AM

## 2019-07-21 ENCOUNTER — Telehealth: Payer: Self-pay | Admitting: Nurse Practitioner

## 2019-07-21 NOTE — Telephone Encounter (Signed)
Please call patient. Any weight gain?  Leg swelling?  Orthopnea?  Cough? With chills, suspect symptoms not related to Hydralazine.   May need to seen primary care for evaluation.  Let me know what her answers are to the above questions. Richardson Dopp, PA-C    07/21/2019 8:08 AM    Left message for patient to call back regarding her concern sent through her MyChart

## 2019-07-21 NOTE — Telephone Encounter (Signed)
Spoke with patient who states she is currently in [place]. She denies leg swelling and states she has not been monitoring weight. She states she will return home tomorrow and will work on monitoring weight and BP over the next few days. She states she is aware her SOB may be residual from her Covid infectioin earlier in March. I advised that chills are not a common side effect of hydralazine. She thanked me for the call and states she will call back if she continues to have concerns once she returns home.

## 2019-07-21 NOTE — Telephone Encounter (Signed)
Please call patient. Any weight gain?  Leg swelling?  Orthopnea?  Cough? With chills, suspect symptoms not related to Hydralazine.   May need to seen primary care for evaluation.  Let me know what her answers are to the above questions. Richardson Dopp, PA-C    07/21/2019 8:08 AM

## 2019-07-28 MED ORDER — HYDRALAZINE HCL 50 MG PO TABS: 50.0000 mg | ORAL_TABLET | Freq: Three times a day (TID) | ORAL | 3 refills | Status: DC

## 2019-07-28 NOTE — Telephone Encounter (Signed)
Increase Hydralazine to 50 mg three times a day. I have sent a new Rx to her pharmacy. Richardson Dopp, PA-C    07/28/2019 7:58 PM

## 2019-07-30 NOTE — Telephone Encounter (Signed)
See patient note. Please arrange ABIs and lower extremity arterial Dopplers to evaluate left leg pain. Continue current medications. Richardson Dopp, PA-C    07/30/2019 4:44 PM

## 2019-07-31 ENCOUNTER — Other Ambulatory Visit: Payer: Self-pay

## 2019-07-31 DIAGNOSIS — M79605 Pain in left leg: Secondary | ICD-10-CM

## 2019-07-31 NOTE — Telephone Encounter (Signed)
Please see MyChart message from yesterday. Schedule patient for ABIs, LE arterial dopplers.  She had a venous US in the hospital and it was neg for DVT.  She should also see her primary care for her leg pain.  If the arterial studies are normal, she will likely need to have her back evaluated. Richardson Dopp, PA-C    07/31/2019 10:34 AM

## 2019-08-13 ENCOUNTER — Ambulatory Visit (HOSPITAL_COMMUNITY)
Admission: RE | Admit: 2019-08-13 | Discharge: 2019-08-13 | Disposition: A | Discharge status: Home / Self Care | Payer: 59 | Source: Ambulatory Visit | Attending: Cardiology | Admitting: Cardiology

## 2019-08-13 ENCOUNTER — Other Ambulatory Visit: Payer: Self-pay

## 2019-08-13 DIAGNOSIS — M79605 Pain in left leg: Secondary | ICD-10-CM | POA: Insufficient documentation

## 2019-08-15 ENCOUNTER — Encounter: Payer: Self-pay | Admitting: Physician Assistant

## 2019-08-25 ENCOUNTER — Other Ambulatory Visit: Payer: Self-pay

## 2019-08-25 MED ORDER — TOUJEO MAX SOLOSTAR 300 UNIT/ML ~~LOC~~ SOPN: 65.0000 [IU] | PEN_INJECTOR | Freq: Every day | SUBCUTANEOUS | 1 refills | Status: DC

## 2019-08-26 ENCOUNTER — Other Ambulatory Visit (INDEPENDENT_AMBULATORY_CARE_PROVIDER_SITE_OTHER): Payer: 59

## 2019-08-26 ENCOUNTER — Other Ambulatory Visit: Payer: Self-pay

## 2019-08-26 DIAGNOSIS — Z794 Long term (current) use of insulin: Secondary | ICD-10-CM | POA: Diagnosis not present

## 2019-08-26 DIAGNOSIS — E1165 Type 2 diabetes mellitus with hyperglycemia: Secondary | ICD-10-CM | POA: Diagnosis not present

## 2019-08-26 DIAGNOSIS — I2510 Atherosclerotic heart disease of native coronary artery without angina pectoris: Secondary | ICD-10-CM

## 2019-08-26 DIAGNOSIS — E7800 Pure hypercholesterolemia, unspecified: Secondary | ICD-10-CM

## 2019-08-26 LAB — BASIC METABOLIC PANEL
BUN: 17 mg/dL (ref 6–23)
CO2: 31 mEq/L (ref 19–32)
Calcium: 9.2 mg/dL (ref 8.4–10.5)
Chloride: 101 mEq/L (ref 96–112)
Creatinine, Ser: 0.95 mg/dL (ref 0.40–1.20)
GFR: 72.22 mL/min (ref >60.00)
Glucose, Bld: 224 mg/dL — ABNORMAL HIGH (ref 70–99)
Potassium: 4.1 mEq/L (ref 3.5–5.1)
Sodium: 137 mEq/L (ref 135–145)

## 2019-08-26 LAB — HEMOGLOBIN A1C: Hgb A1c MFr Bld: 9.2 % — ABNORMAL HIGH (ref 4.6–6.5)

## 2019-08-26 LAB — MICROALBUMIN / CREATININE URINE RATIO
Creatinine,U: 165.7 mg/dL
Microalb Creat Ratio: 0.4 mg/g (ref 0.0–30.0)
Microalb, Ur: <0.7 mg/dL (ref 0.0–1.9)

## 2019-08-29 ENCOUNTER — Telehealth: Payer: Self-pay | Admitting: Physician Assistant

## 2019-08-29 NOTE — Telephone Encounter (Signed)
Refer to Lipid Clinic. Richardson Dopp, PA-C    08/29/2019 1:11 PM

## 2019-08-29 NOTE — Telephone Encounter (Signed)
Attempted phone call to pt. Left voicemail message to contact RN at [phone].  Order placed for referral to lipid clinic per Richardson Dopp, PA.

## 2019-08-29 NOTE — Telephone Encounter (Signed)
Pt returned phone call and pt advised Destiny Beasley has referred pt to [facility] and she will receive a phone call to schedule appointment.  Pt verbalizes understanding and agrees with current plan.

## 2019-08-29 NOTE — Telephone Encounter (Signed)
    I went in pt's chart to see who called him today 

## 2019-09-03 ENCOUNTER — Ambulatory Visit: Payer: 59 | Admitting: Endocrinology

## 2019-09-10 ENCOUNTER — Telehealth: Payer: Self-pay

## 2019-09-10 ENCOUNTER — Other Ambulatory Visit: Payer: Self-pay | Admitting: Endocrinology

## 2019-09-10 NOTE — Telephone Encounter (Signed)
This has been done.

## 2019-09-10 NOTE — Telephone Encounter (Signed)
Deny and sent message for her to schedule follow-up with labs

## 2019-09-10 NOTE — Telephone Encounter (Signed)
Pt needs to be scheduled for f/u appt with labs prior.

## 2019-09-10 NOTE — Telephone Encounter (Signed)
Okay to refill? 

## 2019-09-12 NOTE — Telephone Encounter (Signed)
LMTCB to set up appt for fu  Pt had labs done on 08/26/19, she needs more labs?

## 2019-09-15 NOTE — Telephone Encounter (Signed)
Left message on voicemail to call office. Need lab appt prior to visit and f/u visit with Dr. Dwyane Dee.

## 2019-09-18 ENCOUNTER — Other Ambulatory Visit: Payer: Self-pay

## 2019-09-18 ENCOUNTER — Ambulatory Visit (INDEPENDENT_AMBULATORY_CARE_PROVIDER_SITE_OTHER): Payer: 59 | Admitting: Pharmacist

## 2019-09-18 DIAGNOSIS — E7800 Pure hypercholesterolemia, unspecified: Secondary | ICD-10-CM

## 2019-09-18 MED ORDER — ROSUVASTATIN CALCIUM 5 MG PO TABS
5.0000 mg | ORAL_TABLET | ORAL | 3 refills | Status: DC
Start: 2019-09-18 — End: 2019-10-29

## 2019-09-18 NOTE — Progress Notes (Signed)
Patient ID: Destiny Beasley                 DOB: 1955/10/22                    MRN: [id number]     HPI: Destiny Beasley is a 64 y.o. female patient of Dr. Antionette Char referred to lipid clinic by Richardson Dopp, PA. PMH is significant for CAD s/p STEMI in [id number], systolic CHF, DM, prior TIA, HTN and HLD. She has COVD back in March. Lipid panel at the end of April showed elevated LDL. She has had issues with statins in the past. Was also on Repatha previously.  Patient presents today to discuss lipid treatment options. She is currently on pravastatin 20mg  weekly and is tolerating well. She has never tried rosuvastatin weekly or Praluent. She states her diet was very poor for the last few months becauase she was traveling for work and eating out all the time. She is now cooking at home, packing her lunch and tracking her food in an app. She wants to lose 2lb a week. Only time she uses butter is on her toast. Uses olive oil on veggies.She will get her COVID vaccine once her 90 days is up. Uses the [facility] in [place] [address]   Current Medications: pravastatin 20mg  once a week on Friday Intolerances: rousuvastatin 10 daily (muscle pains), rosuvastatin 20 daily (muscle pains), rosuvastatin 5mg  daily (muscle pains), simvastatin 20 (muscle pains), Repatha (muscle aches), zetia (muscle pains) Risk Factors: ASCVD, DM LDL goal: <55  Diet: coffee w/ toast (honey wheat) and butter Lunch: apple, ham and [place] w/ cheese sandwich, grilled vegetables, chili beans Dinner: salad, broccoli soup, chicken wings w/ air fryer, vegetables  Exercise: walk on treadmill 15-30 min every otherday  Family History: The patient's family history includes Diabetes in her mother; Hyperlipidemia in her mother; Hypertension in her mother.  Social History: never smoked, + ETOH  Labs:08/21/19 TC 223 HDL 51 TG 100 LDL 154 (pravastatin 20mg  weekly)  Past Medical History:  Diagnosis Date  . Anemia   . Anxiety   . CAD in native  artery    a. Inf STEMI 123XX123 complicated by cardiogenic shock/complete heart block s/p emergent cath showing culprit large dominant LCx s/p aspiration thrombectomy and DES, EF 50% by cath, 40-45% by echo.  . Chronic systolic heart failure ([place]) 06/25/2017   Echo 11/19: mild LVH, EF 40-45, inf-lat HK, Gr 1 DD, trivial MR  . Complete heart block, transient ([place])    a. 06/2017 in setting of acute inf MI -> resolved after PCI.  . Diabetes mellitus   . Goiter   . History of radiation therapy 06/14/2016 - 07/19/2016   Right Breast 50 Gy 25 fractions  . Hypercholesteremia   . Hypertension   . Ischemic cardiomyopathy    a. EF 50% by cath and 40-45% by echo 06/2017.  . Leg pain    ABIs 07/2019: Normal (R 1.27; L 1.25)  . Malignant neoplasm of upper-outer quadrant of right female breast ([place]) 02/24/2016  . Meningioma ([place])   . Nuclear stress test    Nuclear stress test 10/19: EF 35, inferolateral, apical scar; inferior, apical inferior infarct with mild peri-infarct ischemia; high risk  . Obesity   . Personal history of chemotherapy   . Personal history of radiation therapy   . Pituitary tumor   . Seasonal asthma   . Sleep apnea    does not use every night  .  Stroke [facility])    TIA - Dec 1 st, 2017    Current Outpatient Medications on File Prior to Visit  Medication Sig Dispense Refill  . amLODipine (NORVASC) 10 MG tablet Take 1 tablet (10 mg total) by mouth daily. 180 tablet 3  . Ascorbic Acid (VITAMIN C) 1000 MG tablet Take 1,000 mg by mouth daily.    Marland Kitchen aspirin EC 81 MG tablet Take 1 tablet (81 mg total) by mouth daily. 90 tablet 3  . carvedilol (COREG) 12.5 MG tablet Take 1.5 tablets (18.75 mg total) by mouth 2 (two) times daily. 270 tablet 3  . Cholecalciferol (VITAMIN D-3 PO) Take 1 tablet by mouth daily.     . Continuous Blood Gluc Transmit (DEXCOM G6 TRANSMITTER) MISC 1 each by Does not apply route daily. 3 each 3  . ezetimibe (ZETIA) 10 MG tablet Take 1 tablet (10 mg total) by mouth  daily. 90 tablet 3  . furosemide (LASIX) 20 MG tablet Take 1 tablet (20 mg total) by mouth daily. 90 tablet 2  . hydrALAZINE (APRESOLINE) 50 MG tablet Take 1 tablet (50 mg total) by mouth 3 (three) times daily. 270 tablet 3  . insulin glargine, 2 Unit Dial, (TOUJEO MAX SOLOSTAR) 300 UNIT/ML Solostar Pen Inject 65 Units into the skin daily. 9 mL 1  . insulin lispro (HUMALOG KWIKPEN) 100 UNIT/ML KwikPen Inject 20 Units into the skin See admin instructions. Inject 20 units under the skin before lunch and dinner.     . irbesartan (AVAPRO) 300 MG tablet Take 300 mg by mouth daily.    . MULTIPLE VITAMIN PO Take 1 tablet daily by mouth.    . nitroGLYCERIN (NITROSTAT) 0.4 MG SL tablet Place 1 tablet (0.4 mg total) under the tongue every 5 (five) minutes as needed for chest pain. 25 tablet 0  . pravastatin (PRAVACHOL) 20 MG tablet Take 20 mg by mouth once a week. Friday    . Semaglutide (RYBELSUS) 14 MG TABS Take 1 tablet by mouth daily before breakfast. 30 tablet 2  . Venlafaxine HCl 150 MG TB24 Take 150 mg by mouth daily.     . vitamin B-12 (CYANOCOBALAMIN) 100 MCG tablet Take 100 mcg by mouth daily.     No current facility-administered medications on file prior to visit.    Allergies  Allergen Reactions  . Hydrocodone Other (See Comments)    Severe anxiety Confusion   . Invokana [Canagliflozin] Other (See Comments)    Constant yeast infections  . Imdur [Isosorbide Nitrate] Other (See Comments)    Patient reported wheezing  . Repatha [Evolocumab]     Muscle aches  . Rosuvastatin     myalgias  . Simvastatin     myalgias  . Atorvastatin Other (See Comments)    DIZZINESS    Assessment/Plan:  1. Hyperlipidemia - LDL is above goal of <55. Will switch from pravastatin 20mg  weekly to rosuvastatin 5mg  weekly. Patient did not tolerated Repatha but is agreeable to trying Praluent 75mg  q 14 days. I will submit prior auth. May need to change again after June 1 as something with patients insurance  is changing. I will contact patient once determination is made. Patient encouraged to limit butter and use whole grain bread.   Thank you,  Ramond Dial, Pharm.D, BCPS, CPP [place]  [id number] [place], [place] [address]  Phone: [phone]; Fax: [phone]

## 2019-09-18 NOTE — Patient Instructions (Addendum)
It was a pleasure to meet you!  Please STOP taking pravastatin START taking rosuvastatin 5mg  once a week I will submit prior authorization for Praluent 75mg  every 14 days I will call you once it is approved  Call us at [phone] with any questions or concerns

## 2019-09-25 ENCOUNTER — Telehealth: Payer: Self-pay

## 2019-09-25 NOTE — Telephone Encounter (Signed)
Called pt and made her aware of this, and she verbalized understanding. Lab appt has been canceled.

## 2019-09-25 NOTE — Telephone Encounter (Signed)
Received fax from [email or web address] indicating that Jardiance 10mg  tablets require a PA.  Covered formulary alternatives are Metformin 500mg  tabs, Pioglitazone 30mg  tabs, and Tradjenta. All of these medications do not require a PA.  Medications that may also be covered but do require a PA include Trulicity 1.5mg , Invokana 300mg , Farxiga 10mg , Januvia 100mg , and Alogliptin 25mg .  Please advise on how you would like to proceed.

## 2019-09-25 NOTE — Telephone Encounter (Signed)
Called pt and scheduled her for blood work and an appt. Please order all blood work you would like the pt to have.

## 2019-09-25 NOTE — Telephone Encounter (Signed)
Will discuss at the patient's next visit.  Please have her schedule appointment with labs.  In office visit needed

## 2019-09-25 NOTE — Telephone Encounter (Signed)
She did have labs about a month ago so we do not need to repeat.  Need to make sure she is using her Dexcom consistently

## 2019-09-29 MED ORDER — PRALUENT 75 MG/ML ~~LOC~~ SOAJ: 1.0000 "pen " | SUBCUTANEOUS | 11 refills | Status: DC

## 2019-09-30 ENCOUNTER — Other Ambulatory Visit: Payer: 59

## 2019-10-07 ENCOUNTER — Other Ambulatory Visit: Payer: Self-pay

## 2019-10-07 ENCOUNTER — Encounter: Payer: Self-pay | Admitting: Endocrinology

## 2019-10-07 ENCOUNTER — Ambulatory Visit: Payer: 59 | Admitting: Endocrinology

## 2019-10-07 VITALS — BP 140/86 | HR 88 | Ht 72.0 in | Wt 260.8 lb

## 2019-10-07 DIAGNOSIS — E1165 Type 2 diabetes mellitus with hyperglycemia: Secondary | ICD-10-CM | POA: Diagnosis not present

## 2019-10-07 DIAGNOSIS — Z794 Long term (current) use of insulin: Secondary | ICD-10-CM | POA: Diagnosis not present

## 2019-10-07 DIAGNOSIS — I10 Essential (primary) hypertension: Secondary | ICD-10-CM

## 2019-10-07 MED ORDER — METFORMIN HCL 500 MG PO TABS
1000.0000 mg | ORAL_TABLET | Freq: Two times a day (BID) | ORAL | 3 refills | Status: DC
Start: 2019-10-07 — End: 2019-12-10

## 2019-10-07 NOTE — Patient Instructions (Signed)
Toujeo 40 am and 40-45 pm  Start taking Metformin 500 mg, 1 tablet with your main meal for 5 days. Occasionally this may initially cause loose stools or nausea.  If  tolerating well after 5 days add a second Metformin tablet (500 mg) in am  Continue adding another tablet after 5 days days if no persistent nausea or diarrhea until reaching the maximum tolerated dose or the full dose of 4 tablets once daily.

## 2019-10-07 NOTE — Progress Notes (Signed)
Patient ID: Destiny Beasley, female   DOB: 1955/05/24, 64 y.o.   MRN: [id number]    Reason for Appointment: Type II Diabetes follow-up   History of Present Illness    Date of diagnosis: 10/2008  Previous history: She had markedly increased blood sugars at diagnosis and was tried on oral hypoglycemic drugs and Victoza for about 9 months before starting an insulin.  Since 2011 she had been on basal bolus insulin regimen Previously had had difficulty controlling her diabetes mostly because of difficulty with compliance with various aspects of self-care.  Previous regimen: Toujeo 60 units at 8 am . Apidra 20 units at times  She was started on the Omnipod insulin pump on 09/11/16  Recent history:   Insulin regimen: HUMALOG U-100, 20 to 60 units 3 times a day Toujeo 80 units in the morning daily  Non-insulin hypoglycemic drugs: Rybelsus 14 mg daily  Her A1c has been consistently high, now 9.2         Current management, blood sugar patterns and problems:  She is having much higher blood sugars despite increasing her basal insulin and using Toujeo  On her previous visit she did not have any basal insulin  Also has been taking a higher dose of Rybelsus without side effect  She was told to start Jardiance but she thinks it was not covered by insurance  She is not sure if her sugars have been more difficult to control because of having Covid infection 2 months ago  She has had usual difficulty with sometimes having excessive snacks  However she is trying to take Humalog 3 times a day with every meal as directed  Blood sugar patterns are detailed below but she does have variability from day-to-day  Her only episode of hypoglycemia was likely related to Humalog to correct high readings late at night  AVERAGE blood sugar at any given time is usually over 200 but somewhat lower between 7-9 AM on his CGM  EXERCISE frequency has been only 1 or 2 days a week  Weight is  slightly higher than last visit  Side effects from medications: Candidiasis from [place]    Dates of Recording: Last 2 weeks  Sensor description: G6  Results statistics:   CGM use % of time  77  Average and SD  227+/-66  Time in range       23%  % Time Above 180  77  % Time above 250  36  % Time Below target  0.1     Glycemic patterns summary: Overall blood sugars are highly variable and persistently high, on an average blood sugars are above 180 target throughout the day and night  Hyperglycemic episodes occurring overnight and to variable extent after breakfast and dinnertime is incomplete on some days during the day  Hypoglycemic episodes occurred  Overnight periods: Blood sugars are generally averaging 234 at midnight and gradually until breakfast time when they are averaging about 180  Preprandial periods: Blood sugars are averaging about 180 at breakfast and about 220-240 dinner  Postprandial periods:   After breakfast: She has variable response of the blood sugars but overall on an average of blood sugars are increasing about 70 mg After lunch:   Blood sugars are usually on an average flat compared to Premeal blood sugar After dinner: Glucose variability is significant and does not consistently have any rise in blood sugar postprandially on several days  CGM use % of time  69  Average and SD  199, +/-49  Time in range  31     %  % Time Above 180  69, previous 52  % Time above 250  13, previous 16  % Time Below target  0.1       Dietician visit: Most recent: 04/2010 Weight control:  Wt Readings from Last 3 Encounters:  10/07/19 260 lb 12.8 oz (118.3 kg)  07/11/19 263 lb 9.6 oz (119.6 kg)  06/25/19 259 lb (117.5 kg)         Diabetes labs:  Lab Results  Component Value Date   HGBA1C 9.2 (H) 08/26/2019   HGBA1C 9.3 (H) 06/04/2019   HGBA1C 10.3 (H) 01/17/2019   Lab Results  Component  Value Date   MICROALBUR <0.7 08/26/2019   LDLCALC 141 (H) 04/23/2019   CREATININE 0.95 08/26/2019      No visits with results within 1 Day(s) from this visit.  Latest known visit with results is:  Lab on 08/26/2019  Component Date Value Ref Range Status  . Hgb A1c MFr Bld 08/26/2019 9.2* 4.6 - 6.5 % Final   Glycemic Control Guidelines for People with Diabetes:Non Diabetic:  <6%Goal of Therapy: <7%Additional Action Suggested:  >8%   . Sodium 08/26/2019 137  135 - 145 mEq/L Final  . Potassium 08/26/2019 4.1  3.5 - 5.1 mEq/L Final  . Chloride 08/26/2019 101  96 - 112 mEq/L Final  . CO2 08/26/2019 31  19 - 32 mEq/L Final  . Glucose, Bld 08/26/2019 224* 70 - 99 mg/dL Final  . BUN 08/26/2019 17  6 - 23 mg/dL Final  . Creatinine, Ser 08/26/2019 0.95  0.40 - 1.20 mg/dL Final  . GFR 08/26/2019 72.22  >60.00 mL/min Final  . Calcium 08/26/2019 9.2  8.4 - 10.5 mg/dL Final  . Microalb, Ur 08/26/2019 <0.7  0.0 - 1.9 mg/dL Final  . Creatinine,U 08/26/2019 165.7  mg/dL Final  . Microalb Creat Ratio 08/26/2019 0.4  0.0 - 30.0 mg/g Final     Allergies as of 10/07/2019      Reactions   Hydrocodone Other (See Comments)   Severe anxiety Confusion   Invokana [canagliflozin] Other (See Comments)   Constant yeast infections   Imdur [isosorbide Nitrate] Other (See Comments)   Patient reported wheezing   Repatha [evolocumab]    Muscle aches   Rosuvastatin    myalgias   Simvastatin    myalgias   Atorvastatin Other (See Comments)   DIZZINESS      Medication List       Accurate as of October 07, 2019 12:02 PM. If you have any questions, ask your nurse or doctor.        amLODipine 10 MG tablet Commonly known as: NORVASC Take 1 tablet (10 mg total) by mouth daily.   aspirin EC 81 MG tablet Take 1 tablet (81 mg total) by mouth daily.   carvedilol 12.5 MG tablet Commonly known as: COREG Take 1.5 tablets (18.75 mg total) by mouth 2 (two) times daily.   Dexcom G6 Transmitter Misc 1 each  by Does not apply route daily.   furosemide 20 MG tablet Commonly known as: LASIX Take 1 tablet (20 mg total) by mouth daily.   HumaLOG KwikPen 100 UNIT/ML KwikPen Generic drug: insulin lispro Inject 20 Units into the skin See admin instructions. Inject 20 units under the skin before lunch and dinner.   hydrALAZINE 50 MG tablet Commonly known as: APRESOLINE Take 1  tablet (50 mg total) by mouth 3 (three) times daily.   irbesartan 300 MG tablet Commonly known as: AVAPRO Take 300 mg by mouth daily.   metFORMIN 500 MG tablet Commonly known as: GLUCOPHAGE Take 2 tablets (1,000 mg total) by mouth 2 (two) times daily with a meal. Started by: Elayne Snare, MD   MULTIPLE VITAMIN PO Take 1 tablet daily by mouth.   nitroGLYCERIN 0.4 MG SL tablet Commonly known as: NITROSTAT Place 1 tablet (0.4 mg total) under the tongue every 5 (five) minutes as needed for chest pain.   Praluent 75 MG/ML Soaj Generic drug: Alirocumab Inject 1 pen into the skin every 14 (fourteen) days.   rosuvastatin 5 MG tablet Commonly known as: CRESTOR Take 1 tablet (5 mg total) by mouth once a week.   Rybelsus 14 MG Tabs Generic drug: Semaglutide Take 1 tablet by mouth daily before breakfast.   Toujeo Max SoloStar 300 UNIT/ML Solostar Pen Generic drug: insulin glargine (2 Unit Dial) Inject 65 Units into the skin daily.   Venlafaxine HCl 150 MG Tb24 Take 150 mg by mouth daily.   vitamin B-12 100 MCG tablet Commonly known as: CYANOCOBALAMIN Take 100 mcg by mouth daily.   vitamin C 1000 MG tablet Take 1,000 mg by mouth daily.   VITAMIN D-3 PO Take 1 tablet by mouth daily.       Allergies:  Allergies  Allergen Reactions  . Hydrocodone Other (See Comments)    Severe anxiety Confusion   . Invokana [Canagliflozin] Other (See Comments)    Constant yeast infections  . Imdur [Isosorbide Nitrate] Other (See Comments)    Patient reported wheezing  . Repatha [Evolocumab]     Muscle aches  .  Rosuvastatin     myalgias  . Simvastatin     myalgias  . Atorvastatin Other (See Comments)    DIZZINESS    Past Medical History:  Diagnosis Date  . Anemia   . Anxiety   . CAD in native artery    a. Inf STEMI 11/3149 complicated by cardiogenic shock/complete heart block s/p emergent cath showing culprit large dominant LCx s/p aspiration thrombectomy and DES, EF 50% by cath, 40-45% by echo.  . Chronic systolic heart failure ([place]) 06/25/2017   Echo 11/19: mild LVH, EF 40-45, inf-lat HK, Gr 1 DD, trivial MR  . Complete heart block, transient ([place])    a. 06/2017 in setting of acute inf MI -> resolved after PCI.  . Diabetes mellitus   . Goiter   . History of radiation therapy 06/14/2016 - 07/19/2016   Right Breast 50 Gy 25 fractions  . Hypercholesteremia   . Hypertension   . Ischemic cardiomyopathy    a. EF 50% by cath and 40-45% by echo 06/2017.  . Leg pain    ABIs 07/2019: Normal (R 1.27; L 1.25)  . Malignant neoplasm of upper-outer quadrant of right female breast ([place]) 02/24/2016  . Meningioma ([place])   . Nuclear stress test    Nuclear stress test 10/19: EF 35, inferolateral, apical scar; inferior, apical inferior infarct with mild peri-infarct ischemia; high risk  . Obesity   . Personal history of chemotherapy   . Personal history of radiation therapy   . Pituitary tumor   . Seasonal asthma   . Sleep apnea    does not use every night  . Stroke [facility])    TIA - Dec 1 st, 2017    Past Surgical History:  Procedure Laterality Date  . ABDOMINAL HYSTERECTOMY    .  BRAIN MENINGIOMA EXCISION  2005  . BREAST BIOPSY    . BREAST LUMPECTOMY Right    2017  . BREAST LUMPECTOMY WITH RADIOACTIVE SEED LOCALIZATION Right 04/20/2016   Procedure: RIGHT BREAST LUMPECTOMY WITH RADIOACTIVE SEED LOCALIZATION;  Surgeon: Fanny Skates, MD;  Location: [place];  Service: General;  Laterality: Right;  . BREAST REDUCTION SURGERY Bilateral 10/23/2016   Procedure: BILATERAL BREAST REDUCTION  WITH LIPOSUCTION ASSISTANCE;  Surgeon: Cristine Polio, MD;  Location: [place];  Service: Plastics;  Laterality: Bilateral;  . CESAREAN SECTION    . CORONARY STENT INTERVENTION N/A 06/25/2017   Procedure: CORONARY STENT INTERVENTION;  Surgeon: Sherren Mocha, MD;  Location: [place] [facility];  Service: Cardiovascular;  Laterality: N/A;  . CORONARY/GRAFT ACUTE MI REVASCULARIZATION N/A 06/25/2017   Procedure: Coronary/Graft Acute MI Revascularization;  Surgeon: Sherren Mocha, MD;  Location: [place] [facility];  Service: Cardiovascular;  Laterality: N/A;  . FOOT SURGERY Bilateral 2016   hammer toe surgery  . LEFT HEART CATH AND CORONARY ANGIOGRAPHY N/A 06/25/2017   Procedure: LEFT HEART CATH AND CORONARY ANGIOGRAPHY;  Surgeon: Sherren Mocha, MD;  Location: [place] [facility];  Service: Cardiovascular;  Laterality: N/A;  . MENISCUS REPAIR Left 2015  . PITUITARY SURGERY    . REDUCTION MAMMAPLASTY    . RIGHT HEART CATH N/A 06/25/2017   Procedure: RIGHT HEART CATH;  Surgeon: Sherren Mocha, MD;  Location: [place] [facility];  Service: Cardiovascular;  Laterality: N/A;  . THYROID SURGERY      Family History  Problem Relation Age of Onset  . Diabetes Mother   . Hyperlipidemia Mother   . Hypertension Mother     Social History:  reports that she has never smoked. She has never used smokeless tobacco. She reports current alcohol use of about 1.0 standard drink of alcohol per week. She reports that she does not use drugs.  Review of Systems:  Hypertension:  Variable control, blood pressure is managed by cardiologist with readings as follows:  Currently taking Avapro and hydralazine as well as amlodipine and Coreg  BP Readings from Last 3 Encounters:  10/07/19 140/86  07/11/19 (!) 151/85  06/27/19 (!) 146/83     Lipids: She was started on Praluent just recently, apparently could not tolerate Repatha  Followed by cardiology now  She does have a history of MI  Lab  Results  Component Value Date   CHOL 223 (H) 04/23/2019   HDL 57 04/23/2019   LDLCALC 141 (H) 04/23/2019   LDLDIRECT 87.0 06/04/2019   TRIG 138 04/23/2019   CHOLHDL 3.9 04/23/2019   Lab Results  Component Value Date   ALT 19 06/11/2018    Diabetic shoe prescription has been given previously      Examination:   BP 140/86 (BP Location: Left Arm, Patient Position: Sitting, Cuff Size: Large)   Pulse 88   Ht 6' (1.829 m)   Wt 260 lb 12.8 oz (118.3 kg)   SpO2 98%   BMI 35.37 kg/m   Body mass index is 35.37 kg/m.     ASSESSMENT/ PLAN:    Diabetes type 2 with obesity See history of present illness for detailed discussion of current management, interpretation of her continuous glucose monitoring record, blood sugar patterns and problems identified  Her A1c has usually been consistently high and now 9.2 last month  Blood sugars are averaging over 200 throughout the day except for early morning and she appears to be more insulin resistant despite taking 140  units total daily including basal insulin She has significant variability in blood sugars but appears to be needing higher doses of basal insulin However blood sugar patterns indicate that she will benefit more from an insulin pump to reduce variability, improve insulin action and deliver higher basal insulin in the early part of the night  Recommendations today:    We will try to get her insurance verification for the OmniPod insulin pump again which had previously helped her control  For now we will try to split her Toujeo to 40 units twice daily but she will continue to increase her evening dose by 5 units until morning sugars are consistently at target  Trial of Metformin to help with insulin resistance, she prefers to try the regular and not the extended release preparation.  She will start with 500 mg and increase by 1 tablet every 5 days until she has the maximum tolerated dose achieved  Increase exercise as  tolerated  Adjust mealtime dose based on how much she is eating and increase the dose for higher Premeal blood sugar  We will start her on the insulin pump when approved, also discussed that she may be also eligible for a closed-loop system when this is available with the OmniPod  HYPERTENSION on multiple medications: Recently better controlled   Patient Instructions  Toujeo 40 am and 40-45 pm  Start taking Metformin 500 mg, 1 tablet with your main meal for 5 days. Occasionally this may initially cause loose stools or nausea.  If  tolerating well after 5 days add a second Metformin tablet (500 mg) in am  Continue adding another tablet after 5 days days if no persistent nausea or diarrhea until reaching the maximum tolerated dose or the full dose of 4 tablets once daily.   Elayne Snare 10/07/2019, 12:02 PM   This visit occurred during the SARS-CoV-2 public health emergency.  Safety protocols were in place, including screening questions prior to the visit, additional usage of staff PPE, and extensive cleaning of exam room while observing appropriate contact time as indicated for disinfecting solutions.

## 2019-10-13 ENCOUNTER — Ambulatory Visit: Payer: 59 | Admitting: Physician Assistant

## 2019-10-15 ENCOUNTER — Institutional Professional Consult (permissible substitution): Payer: 59 | Admitting: Plastic Surgery

## 2019-10-20 ENCOUNTER — Other Ambulatory Visit: Payer: Self-pay

## 2019-10-20 ENCOUNTER — Telehealth: Payer: Self-pay

## 2019-10-20 MED ORDER — INSULIN LISPRO 100 UNIT/ML CARTRIDGE: 50.0000 [IU] | Freq: Two times a day (BID) | SUBCUTANEOUS | 0 refills | Status: DC

## 2019-10-20 NOTE — Telephone Encounter (Signed)
FAXED [place]: [place]: Order form  Other records requested: chart notes.  All above requested information has been faxed successfully to [employer] listed above. Documents and fax confirmation have been placed in the faxed file for future reference.

## 2019-10-23 ENCOUNTER — Telehealth: Payer: Self-pay

## 2019-10-23 NOTE — Telephone Encounter (Signed)
FAXED [place]: [employer] (Faxed on 10/23/19) Document: Chart notes Other records requested: none at this time.  All above requested information has been faxed successfully to [employer] listed above. Documents and fax confirmation have been placed in the faxed file for future reference.

## 2019-10-28 ENCOUNTER — Telehealth: Payer: Self-pay

## 2019-10-28 NOTE — Telephone Encounter (Signed)
FAXED [place]: [employer] (Faxed on 10/28/2019) Document: chart notes and labs Other records requested: none at this time.  All above requested information has been faxed successfully to [employer] listed above. Documents and fax confirmation have been placed in the faxed file for future reference.

## 2019-10-28 NOTE — Progress Notes (Signed)
Cardiology Office Note:    Date:  10/29/2019   ID:  Destiny Beasley, DOB 1955/05/03, MRN [id number]  PCP:  Lawerance Cruel, MD  Cardiologist:  Sherren Mocha, MD   Electrophysiologist:  None   Referring MD: Lawerance Cruel, MD   Chief Complaint:  Follow-up (CAD, CHF)    Patient Profile:    Destiny Beasley is a 64 y.o. female with:   Coronary artery disease  ? S/p inf-lat STEMI in 06/2017 c/b CGS, acute HFrEF, CHB >>PCI: DES to pLCx ? Myoview 10/19: inferior scar, mild peri-infarct ischemia  Chronic Systolic CHF ? Ischemic CM ? Echocardiogram 3/19: EF 40-45 ? Echocardiogram 11/19: EF 40-45 ? Echocardiogram 08/2018: EF 40-45  Diabetes mellitus   Prior TIA  OSA  Breast CA   Hypertension   Hyperlipidemia  Intol of statins, Evolocumab   Hx of COVID-19 in 06/2019  Prior cardiac studies: ABIs 07/2019 Normal   Echocardiogram 09/05/2018 EF 40-45, inf-lat AK, mild LVH, Gr 1 DD, normal RVSF  Echo 03/07/18 Mild LVH, EF 40-45, GLS -11%, inf-lat HK, Gr 1 DD, trivial MR  Nuclear stress test10/22/19 EF 35, inferolateral, apical scar; inferior, apical inferior infarct with mild peri-infarct ischemia; high risk  Echo 06/26/17 EF 40-45, inferolateral hypokinesis, mild MR  Cardiac catheterization 06/25/17 LM luminal irregularities LAD luminal irregularities, proximal 30 LCx proximal 100 RCA irregularities EF 45-50, inferolateral akinesis PCI: 4 x 20 mm Synergy DES to the proximal LCx  Echo 03/25/16 Moderate LVH, EF 60-65, normal wall motion  Carotid US 12/17 Bilateral ICA 1-39    History of Present Illness:    Destiny Beasley was last seen via telemedicine in March 2021.  Hydralazine has been added to her medical regimen due to uncontrolled blood pressure.  She was having a lot of leg pain around the time she was diagnosed with COVID-19.  This continued and we obtained ABIs.  These were normal.  She has seen the lipid clinic as well and has been placed on  Alirocumab.  She returns for follow-up.  She is here alone.  Since last seen, she has been doing well without chest discomfort, significant shortness of breath, orthopnea, syncope or leg swelling.  She has continued to have significant bilateral leg pain that is felt to be related to COVID-19.  She has seen orthopedics and has been placed on prednisone taper with some improvement.  She has not been able to tolerate once weekly rosuvastatin and has stopped this.  She also stopped hydralazine due to significant fatigue.  Her blood pressures at home have been close to optimal on her current therapy.     Past Medical History:  Diagnosis Date   Anemia    Anxiety    CAD in native artery    a. Inf STEMI 12/6293 complicated by cardiogenic shock/complete heart block s/p emergent cath showing culprit large dominant LCx s/p aspiration thrombectomy and DES, EF 50% by cath, 40-45% by echo.   Chronic systolic heart failure ([place]) 06/25/2017   Echo 11/19: mild LVH, EF 40-45, inf-lat HK, Gr 1 DD, trivial MR   Complete heart block, transient ([place])    a. 06/2017 in setting of acute inf MI -> resolved after PCI.   Diabetes mellitus    Goiter    History of radiation therapy 06/14/2016 - 07/19/2016   Right Breast 50 Gy 25 fractions   Hypercholesteremia    Hypertension    Ischemic cardiomyopathy    a. EF 50% by cath and 40-45% by echo  06/2017.   Leg pain    ABIs 07/2019: Normal (R 1.27; L 1.25)   Malignant neoplasm of upper-outer quadrant of right female breast ([place]) 02/24/2016   Meningioma [facility])    Nuclear stress test    Nuclear stress test 10/19: EF 35, inferolateral, apical scar; inferior, apical inferior infarct with mild peri-infarct ischemia; high risk   Obesity    Personal history of chemotherapy    Personal history of radiation therapy    Pituitary tumor    Seasonal asthma    Sleep apnea    does not use every night   Stroke [facility])    TIA - Dec 1 st, 2017    Current  Medications: Current Meds  Medication Sig   Alirocumab (PRALUENT) 75 MG/ML SOAJ Inject 1 pen into the skin every 14 (fourteen) days.   aspirin EC 81 MG tablet Take 1 tablet (81 mg total) by mouth daily.   carvedilol (COREG) 12.5 MG tablet Take 1.5 tablets (18.75 mg total) by mouth 2 (two) times daily.   Cholecalciferol (VITAMIN D-3 PO) Take 1 tablet by mouth daily.    insulin glargine, 2 Unit Dial, (TOUJEO MAX SOLOSTAR) 300 UNIT/ML Solostar Pen Inject 50 Units into the skin in the morning and at bedtime.   insulin lispro (HUMALOG) 100 UNIT/ML cartridge Inject 0.5 mLs (50 Units total) into the skin in the morning and at bedtime.   irbesartan (AVAPRO) 300 MG tablet Take 300 mg by mouth daily.   metFORMIN (GLUCOPHAGE) 500 MG tablet Take 2 tablets (1,000 mg total) by mouth 2 (two) times daily with a meal.   MULTIPLE VITAMIN PO Take 1 tablet daily by mouth.   Semaglutide (RYBELSUS) 14 MG TABS Take 1 tablet by mouth daily before breakfast.   Venlafaxine HCl 150 MG TB24 Take 150 mg by mouth daily.      Allergies:   Hydrocodone, Invokana [canagliflozin], Imdur [isosorbide nitrate], Rosuvastatin, Simvastatin, and Atorvastatin   Social History   Tobacco Use   Smoking status: Never Smoker   Smokeless tobacco: Never Used  Vaping Use   Vaping Use: Never used  Substance Use Topics   Alcohol use: Yes    Alcohol/week: 1.0 standard drink    Types: 1 Glasses of wine per week    Comment: 1 glass monthly   Drug use: No     Family Hx: The patient's family history includes Diabetes in her mother; Hyperlipidemia in her mother; Hypertension in her mother.  ROS   EKGs/Labs/Other Test Reviewed:    EKG:  EKG is   ordered today.  The ekg ordered today demonstrates normal sinus rhythm, heart rate 89, normal axis, inferior Q waves, nonspecific ST-T wave changes, QTC 433, no change from prior tracing  Recent Labs: 06/25/2019: Hemoglobin 14.1; Platelets 283 08/26/2019: BUN 17; Creatinine, Ser  0.95; Potassium 4.1; Sodium 137   Recent Lipid Panel Lab Results  Component Value Date/Time   CHOL 223 (H) 04/23/2019 07:14 AM   TRIG 138 04/23/2019 07:14 AM   HDL 57 04/23/2019 07:14 AM   CHOLHDL 3.9 04/23/2019 07:14 AM   CHOLHDL 2 08/12/2018 09:44 AM   LDLCALC 141 (H) 04/23/2019 07:14 AM   LDLDIRECT 87.0 06/04/2019 08:35 AM   Labs from PCP personally reviewed and interpreted (from [place]): 08/21/19:  TC 223, HDL 51, LDL 154, Trig 100, Hgb 13.2, ALT 22 08/26/19: A1c 9.2    Physical Exam:    VS:  BP 130/80    Pulse 89    Ht 6' (1.829  m)    Wt 259 lb (117.5 kg)    SpO2 96%    BMI 35.13 kg/m     Wt Readings from Last 3 Encounters:  10/29/19 259 lb (117.5 kg)  10/07/19 260 lb 12.8 oz (118.3 kg)  07/11/19 263 lb 9.6 oz (119.6 kg)     Constitutional:      Appearance: Healthy appearance. Not in distress.  Neck:     Thyroid: No thyromegaly.     Vascular: JVD normal.  Pulmonary:     Effort: Pulmonary effort is normal.     Breath sounds: No wheezing. No rales.  Cardiovascular:     Normal rate. Regular rhythm. Normal S1. Normal S2.     Murmurs: There is no murmur.  Edema:    Peripheral edema absent.  Abdominal:     Palpations: Abdomen is soft. There is no hepatomegaly.  Skin:    General: Skin is warm and dry.  Neurological:     Mental Status: Alert and oriented to person, place and time.     Cranial Nerves: Cranial nerves are intact.       ASSESSMENT & PLAN:    1. Coronary artery disease involving native coronary artery of native heart without angina pectoris History of inferolateral STEMI in March 2019 treated with DES to the LCx. Nuclear stress test in October 2019 demonstrated inferior scar with mild peri-infarct ischemia. She is currently doing well without anginal symptoms. Continue aspirin, Alirocumab, carvedilol, irbesartan. Follow-up in 6 months.  2. Chronic systolic heart failure (HCC) EF 40-45%. NYHA II. Volume status stable. She has not been able to tolerate  Entresto in the past. I believe she also had difficulty with spironolactone. Continue carvedilol, irbesartan. If her blood pressure runs above goal, we could increase her carvedilol further.  3. Essential hypertension Blood pressure close to target. Continue current therapy. Continue to limit salt. She has recently started back exercising. I have encouraged her to continue this.  4. Hypercholesterolemia She is now on Alirocumab. Arrange follow-up lipids LFTs in 8 weeks.    Dispo:  Return in about 6 months (around 04/30/2020) for Routine Follow Up, w/ Dr. Burt Knack, or Richardson Dopp, PA-C, in person.   Medication Adjustments/Labs and Tests Ordered: Current medicines are reviewed at length with the patient today.  Concerns regarding medicines are outlined above.  Tests Ordered: Orders Placed This Encounter  Procedures   Hepatic function panel   Lipid panel   EKG 12-Lead   Medication Changes: No orders of the defined types were placed in this encounter.   Signed, Richardson Dopp, PA-C  10/29/2019 8:55 AM    [facility] [address], [place], [place]  [address] Phone: [phone]; Fax: [phone]

## 2019-10-29 ENCOUNTER — Encounter: Payer: Self-pay | Admitting: Physician Assistant

## 2019-10-29 ENCOUNTER — Other Ambulatory Visit: Payer: Self-pay

## 2019-10-29 ENCOUNTER — Ambulatory Visit: Payer: 59 | Admitting: Physician Assistant

## 2019-10-29 VITALS — BP 130/80 | HR 89 | Ht 72.0 in | Wt 259.0 lb

## 2019-10-29 DIAGNOSIS — E7800 Pure hypercholesterolemia, unspecified: Secondary | ICD-10-CM

## 2019-10-29 DIAGNOSIS — I5022 Chronic systolic (congestive) heart failure: Secondary | ICD-10-CM | POA: Diagnosis not present

## 2019-10-29 DIAGNOSIS — I10 Essential (primary) hypertension: Secondary | ICD-10-CM

## 2019-10-29 DIAGNOSIS — I2510 Atherosclerotic heart disease of native coronary artery without angina pectoris: Secondary | ICD-10-CM | POA: Diagnosis not present

## 2019-10-29 NOTE — Patient Instructions (Signed)
Medication Instructions:   Your physician recommends that you continue on your current medications as directed. Please refer to the Current Medication list given to you today.  *If you need a refill on your cardiac medications before your next appointment, please call your pharmacy*  Lab Work:  Your physician recommends that you return for lab work in 8 weeks on 12/24/19  If you have labs (blood work) drawn today and your tests are completely normal, you will receive your results only by: Marland Kitchen MyChart Message (if you have MyChart) OR . A paper copy in the mail If you have any lab test that is abnormal or we need to change your treatment, we will call you to review the results.  Testing/Procedures:  None ordered today  Follow-Up: At [facility], you and your health needs are our priority.  As part of our continuing mission to provide you with exceptional heart care, we have created designated Provider Care Teams.  These Care Teams include your primary Cardiologist (physician) and Advanced Practice Providers (APPs -  Physician Assistants and Nurse Practitioners) who all work together to provide you with the care you need, when you need it.  We recommend signing up for the patient portal called "MyChart".  Sign up information is provided on this After Visit Summary.  MyChart is used to connect with patients for Virtual Visits (Telemedicine).  Patients are able to view lab/test results, encounter notes, upcoming appointments, etc.  Non-urgent messages can be sent to your provider as well.   To learn more about what you can do with MyChart, go to [email or web address].    Your next appointment:   6 month(s)  The format for your next appointment:   In Person  Provider:   You may see Sherren Mocha, MD or Richardson Dopp, PA-C

## 2019-10-30 ENCOUNTER — Telehealth: Payer: Self-pay

## 2019-10-30 NOTE — Telephone Encounter (Signed)
PA initiated via [email or web address] for Dexcom G6 sensors.    [employer]  Key: [id number]  PA Case ID: [id number] Status: Sent to [facility] Drug: Dexcom G6 Sensor Form: [employer] Electronic Prior Authorization Form [phone] NCPDP)

## 2019-10-31 NOTE — Telephone Encounter (Signed)
PA was denied and cited the reasoning of  "no documentaion exists that patient is motiviated and knowledgeable, On intensive insulin regimen of 3 injections or more per day, and checking blood sugar 3 or more times daily.  PA was reinitiated because the the option was never given to specify this information, therefore insurance company denied the PA based on information they did not ask for proof of.

## 2019-11-02 ENCOUNTER — Other Ambulatory Visit: Payer: Self-pay | Admitting: Endocrinology

## 2019-11-03 NOTE — Telephone Encounter (Signed)
PA was again denied for the reasons stated below. Please advise on how you would like to proceed.

## 2019-11-03 NOTE — Telephone Encounter (Signed)
Previous message was a Sales executive mistake.  Please let insurance company know that she meets all the criteria for CGM with 3 or more injections and 3/more testing daily

## 2019-11-03 NOTE — Telephone Encounter (Signed)
Will need to tell them that she does not meet those criteria

## 2019-11-03 NOTE — Telephone Encounter (Signed)
Please clarify. You would like the pt to be notified that she will not be able to get the Dexcom or notify insurance company?

## 2019-11-03 NOTE — Telephone Encounter (Signed)
Noted. Last correspondence with insurance company indicates that at this point, the process of prior authorization appeal is the next step. Will begin the appeals process.

## 2019-11-05 NOTE — Telephone Encounter (Signed)
As of yesterday, clinical notes showing that patient does meet criteria and that she has been stable on the Dexcom system was faxed to the Urgent appeals dept of her insurance company.

## 2019-11-06 LAB — HM DIABETES EYE EXAM

## 2019-11-12 NOTE — Telephone Encounter (Signed)
Received a fax from [employer], [employer] stating that the appeal has been denied for the Dexcom. Called [employer] appeal line and spoke with [place] (reference # [id number]), and she is faxing additional information regarding the appeal. At this time, and appeal for the appeal can be done. Currently waiting on this information via fax. Once reviewed, appropriate documentation will be sent back to the appeals dept at [phone].

## 2019-11-16 ENCOUNTER — Emergency Department (HOSPITAL_COMMUNITY): Payer: 59

## 2019-11-16 ENCOUNTER — Emergency Department (HOSPITAL_COMMUNITY)
Admission: EM | Admit: 2019-11-16 | Discharge: 2019-11-16 | Disposition: A | Discharge status: Home / Self Care | Payer: 59 | Attending: Emergency Medicine | Admitting: Emergency Medicine

## 2019-11-16 ENCOUNTER — Other Ambulatory Visit: Payer: Self-pay

## 2019-11-16 ENCOUNTER — Encounter (HOSPITAL_COMMUNITY): Payer: Self-pay | Admitting: Emergency Medicine

## 2019-11-16 DIAGNOSIS — R072 Precordial pain: Secondary | ICD-10-CM | POA: Diagnosis not present

## 2019-11-16 DIAGNOSIS — R110 Nausea: Secondary | ICD-10-CM | POA: Diagnosis not present

## 2019-11-16 DIAGNOSIS — Z794 Long term (current) use of insulin: Secondary | ICD-10-CM | POA: Diagnosis not present

## 2019-11-16 DIAGNOSIS — M79602 Pain in left arm: Secondary | ICD-10-CM | POA: Diagnosis not present

## 2019-11-16 DIAGNOSIS — Z853 Personal history of malignant neoplasm of breast: Secondary | ICD-10-CM | POA: Diagnosis not present

## 2019-11-16 DIAGNOSIS — R519 Headache, unspecified: Secondary | ICD-10-CM | POA: Diagnosis not present

## 2019-11-16 DIAGNOSIS — Z7982 Long term (current) use of aspirin: Secondary | ICD-10-CM | POA: Insufficient documentation

## 2019-11-16 DIAGNOSIS — R0602 Shortness of breath: Secondary | ICD-10-CM | POA: Insufficient documentation

## 2019-11-16 DIAGNOSIS — I2510 Atherosclerotic heart disease of native coronary artery without angina pectoris: Secondary | ICD-10-CM | POA: Insufficient documentation

## 2019-11-16 DIAGNOSIS — M545 Low back pain: Secondary | ICD-10-CM | POA: Insufficient documentation

## 2019-11-16 DIAGNOSIS — Z79899 Other long term (current) drug therapy: Secondary | ICD-10-CM | POA: Diagnosis not present

## 2019-11-16 DIAGNOSIS — R6883 Chills (without fever): Secondary | ICD-10-CM | POA: Diagnosis not present

## 2019-11-16 DIAGNOSIS — I10 Essential (primary) hypertension: Secondary | ICD-10-CM

## 2019-11-16 DIAGNOSIS — R200 Anesthesia of skin: Secondary | ICD-10-CM | POA: Insufficient documentation

## 2019-11-16 DIAGNOSIS — I5022 Chronic systolic (congestive) heart failure: Secondary | ICD-10-CM | POA: Diagnosis not present

## 2019-11-16 DIAGNOSIS — R079 Chest pain, unspecified: Secondary | ICD-10-CM

## 2019-11-16 DIAGNOSIS — E119 Type 2 diabetes mellitus without complications: Secondary | ICD-10-CM | POA: Diagnosis not present

## 2019-11-16 DIAGNOSIS — J45998 Other asthma: Secondary | ICD-10-CM | POA: Diagnosis not present

## 2019-11-16 DIAGNOSIS — H538 Other visual disturbances: Secondary | ICD-10-CM | POA: Diagnosis not present

## 2019-11-16 DIAGNOSIS — I110 Hypertensive heart disease with heart failure: Secondary | ICD-10-CM | POA: Insufficient documentation

## 2019-11-16 HISTORY — DX: COVID-19: U07.1

## 2019-11-16 LAB — CBC
HCT: 43.4 % (ref 36.0–46.0)
Hemoglobin: 13.7 g/dL (ref 12.0–15.0)
MCH: 29.7 pg (ref 26.0–34.0)
MCHC: 31.6 g/dL (ref 30.0–36.0)
MCV: 93.9 fL (ref 80.0–100.0)
Platelets: 274 10*3/uL (ref 150–400)
RBC: 4.62 MIL/uL (ref 3.87–5.11)
RDW: 14.5 % (ref 11.5–15.5)
WBC: 5.9 10*3/uL (ref 4.0–10.5)
nRBC: 0 % (ref 0.0–0.2)

## 2019-11-16 LAB — BASIC METABOLIC PANEL
Anion gap: 8 (ref 5–15)
BUN: 12 mg/dL (ref 8–23)
CO2: 28 mmol/L (ref 22–32)
Calcium: 9.3 mg/dL (ref 8.9–10.3)
Chloride: 104 mmol/L (ref 98–111)
Creatinine, Ser: 0.88 mg/dL (ref 0.44–1.00)
GFR calc Af Amer: >60 mL/min (ref >60)
GFR calc non Af Amer: >60 mL/min (ref >60)
Glucose, Bld: 172 mg/dL — ABNORMAL HIGH (ref 70–99)
Potassium: 4.3 mmol/L (ref 3.5–5.1)
Sodium: 140 mmol/L (ref 135–145)

## 2019-11-16 LAB — TROPONIN I (HIGH SENSITIVITY)
Troponin I (High Sensitivity): 6 ng/L (ref <18)
Troponin I (High Sensitivity): 6 ng/L (ref <18)

## 2019-11-16 MED ORDER — HYDROCHLOROTHIAZIDE 25 MG PO TABS: 25.0000 mg | ORAL_TABLET | Freq: Every day | ORAL | 0 refills | Status: DC

## 2019-11-16 MED ORDER — SODIUM CHLORIDE 0.9% FLUSH
3.0000 mL | Freq: Once | INTRAVENOUS | Status: AC
  Administered 2019-11-16: 3 mL via INTRAVENOUS

## 2019-11-16 NOTE — ED Provider Notes (Signed)
[facility] EMERGENCY DEPARTMENT Provider Note   CSN: [id number] Arrival date & time: 11/16/19  7782     History Chief Complaint  Patient presents with  . Chest Pain    Destiny Beasley is a 64 y.o. female.  With a PMHx of CAD with MI in 2019, CHF, Echo 08/2018 EF 40-45, HTN, HLD, DM, pituitary tumor, meningioma, and breast cancer who was brought in by EMS for multiple complaints. Yesterday, she was cleaning when she began having tingling under her left arm. She checked her blood pressure and states it was elevated. She then laid down and began having a left sided headache that was aching in nature, without radiation. Central chest tightness that did not radiate. The pain improved when she took one of her nitroglycerin tabs. She also notes of having cold sweats, nausea, and low back pain. The low back pain does not radiate anywhere and she denies history of low back pain. She called EMS but was not brought to the hospital.   This morning at 0730 am, the patient woke up due to chest tightness and shortness of breath. She took a nitroglycerin and has not had chest pain or tightness since. The tightness radiated down her left arm. After this resolved, she notes a feeling of left sided facial tightness, left arm tingling, and visual disturbance that she describes as blurry vision. She then called EMS who brought her to the hospital. Of the above symptoms she notes she still has the left sided headache, left arm numbness, blurry vision, left arm pain.   She denies sore throat, trouble swallowing, abdominal pain, vomiting, lower extremity pain or swelling.   The history is provided by the patient.  Chest Pain Pain location:  Substernal area Pain quality: dull   Pain radiates to:  L shoulder and L arm Pain severity:  Mild Progression:  Resolved Associated symptoms: back pain, nausea and shortness of breath   Associated symptoms: no abdominal pain, no dysphagia, no fever and no  vomiting   Shortness of breath:    Severity:  Mild   Timing:  Intermittent   Progression:  Resolved Shortness of Breath Associated symptoms: chest pain   Associated symptoms: no abdominal pain, no fever and no vomiting        Past Medical History:  Diagnosis Date  . Anemia   . Anxiety   . CAD in native artery    a. Inf STEMI 07/2351 complicated by cardiogenic shock/complete heart block s/p emergent cath showing culprit large dominant LCx s/p aspiration thrombectomy and DES, EF 50% by cath, 40-45% by echo.  . Chronic systolic heart failure ([place]) 06/25/2017   Echo 11/19: mild LVH, EF 40-45, inf-lat HK, Gr 1 DD, trivial MR  . Complete heart block, transient ([place])    a. 06/2017 in setting of acute inf MI -> resolved after PCI.  [place] [facility] COVID-19   . Diabetes mellitus   . Goiter   . History of radiation therapy 06/14/2016 - 07/19/2016   Right Breast 50 Gy 25 fractions  . Hypercholesteremia   . Hypertension   . Ischemic cardiomyopathy    a. EF 50% by cath and 40-45% by echo 06/2017.  . Leg pain    ABIs 07/2019: Normal (R 1.27; L 1.25)  . Malignant neoplasm of upper-outer quadrant of right female breast ([place]) 02/24/2016  . Meningioma ([place])   . Nuclear stress test    Nuclear stress test 10/19: EF 35, inferolateral, apical scar; inferior, apical inferior infarct  with mild peri-infarct ischemia; high risk  . Obesity   . Personal history of chemotherapy   . Personal history of radiation therapy   . Pituitary tumor   . Seasonal asthma   . Sleep apnea    does not use every night  . Stroke [facility])    TIA - Dec 1 st, 2017    Patient Active Problem List   Diagnosis Date Noted  . Chest pain 06/26/2019  . History of TIA (transient ischemic attack) 08/08/2017  . Malignant neoplasm of breast, stage 4 ([place]) 08/08/2017  . History of complete heart block in setting of STEMI 06/2017 06/29/2017  . OSA on CPAP 06/29/2017  . CAD (coronary artery disease) 06/29/2017  . Hx of Infero-Posterior ST Elevation  MI in 06/2017 06/25/2017  . History of cardiogenic shock in setting of STEMI in 06/2017 06/25/2017  . Chronic systolic heart failure ([place]) 06/25/2017  . Malignant neoplasm of upper-outer quadrant of right female breast ([place]) 02/24/2016  . Type II diabetes mellitus, uncontrolled ([place]) 02/11/2014  . Essential hypertension 09/07/2013  . Atypical chest pain 11/11/2011  . Anxiety 11/11/2011  . Insulin dependent diabetes mellitus 11/11/2011  . Hypercholesterolemia 11/11/2011    Past Surgical History:  Procedure Laterality Date  . ABDOMINAL HYSTERECTOMY    . BRAIN MENINGIOMA EXCISION  2005  . BREAST BIOPSY    . BREAST LUMPECTOMY Right    2017  . BREAST LUMPECTOMY WITH RADIOACTIVE SEED LOCALIZATION Right 04/20/2016   Procedure: RIGHT BREAST LUMPECTOMY WITH RADIOACTIVE SEED LOCALIZATION;  Surgeon: Fanny Skates, MD;  Location: [place];  Service: General;  Laterality: Right;  . BREAST REDUCTION SURGERY Bilateral 10/23/2016   Procedure: BILATERAL BREAST REDUCTION WITH LIPOSUCTION ASSISTANCE;  Surgeon: Cristine Polio, MD;  Location: [place];  Service: Plastics;  Laterality: Bilateral;  . CESAREAN SECTION    . CORONARY STENT INTERVENTION N/A 06/25/2017   Procedure: CORONARY STENT INTERVENTION;  Surgeon: Sherren Mocha, MD;  Location: [place] CV LAB;  Service: Cardiovascular;  Laterality: N/A;  . CORONARY/GRAFT ACUTE MI REVASCULARIZATION N/A 06/25/2017   Procedure: Coronary/Graft Acute MI Revascularization;  Surgeon: Sherren Mocha, MD;  Location: [place] CV LAB;  Service: Cardiovascular;  Laterality: N/A;  . FOOT SURGERY Bilateral 2016   hammer toe surgery  . LEFT HEART CATH AND CORONARY ANGIOGRAPHY N/A 06/25/2017   Procedure: LEFT HEART CATH AND CORONARY ANGIOGRAPHY;  Surgeon: Sherren Mocha, MD;  Location: [place] CV LAB;  Service: Cardiovascular;  Laterality: N/A;  . MENISCUS REPAIR Left 2015  . PITUITARY SURGERY    . REDUCTION MAMMAPLASTY    .  RIGHT HEART CATH N/A 06/25/2017   Procedure: RIGHT HEART CATH;  Surgeon: Sherren Mocha, MD;  Location: [place] CV LAB;  Service: Cardiovascular;  Laterality: N/A;  . THYROID SURGERY       OB History    Gravida  4   Para      Term      Preterm      AB      Living  2     SAB      TAB      Ectopic      Multiple      Live Births              Family History  Problem Relation Age of Onset  . Diabetes Mother   . Hyperlipidemia Mother   . Hypertension Mother     Social History   Tobacco Use  . Smoking  status: Never Smoker  . Smokeless tobacco: Never Used  Vaping Use  . Vaping Use: Never used  Substance Use Topics  . Alcohol use: Yes    Alcohol/week: 1.0 standard drink    Types: 1 Glasses of wine per week    Comment: 1 glass monthly  . Drug use: No    Home Medications Prior to Admission medications   Medication Sig Start Date End Date Taking? Authorizing Provider  Alirocumab (PRALUENT) 75 MG/ML SOAJ Inject 1 pen into the skin every 14 (fourteen) days. 09/29/19   Sherren Mocha, MD  aspirin EC 81 MG tablet Take 1 tablet (81 mg total) by mouth daily. 01/01/19   Richardson Dopp T, PA-C  carvedilol (COREG) 12.5 MG tablet Take 1.5 tablets (18.75 mg total) by mouth 2 (two) times daily. 07/11/19   Richardson Dopp T, PA-C  Cholecalciferol (VITAMIN D-3 PO) Take 1 tablet by mouth daily.     [provider]  HUMALOG 100 UNIT/ML cartridge INJECT 50 UNITS INTO THE SKIN EVERY MORNING AND EVERY NIGHT AT BEDTIME 11/03/19   Elayne Snare, MD  insulin glargine, 2 Unit Dial, (TOUJEO MAX SOLOSTAR) 300 UNIT/ML Solostar Pen Inject 50 Units into the skin in the morning and at bedtime.    [provider]  irbesartan (AVAPRO) 300 MG tablet Take 300 mg by mouth daily.    [provider]  metFORMIN (GLUCOPHAGE) 500 MG tablet Take 2 tablets (1,000 mg total) by mouth 2 (two) times daily with a meal. 10/07/19   Elayne Snare, MD  MULTIPLE VITAMIN PO Take 1 tablet daily by  mouth.    [provider]  nitroGLYCERIN (NITROSTAT) 0.4 MG SL tablet Place 1 tablet (0.4 mg total) under the tongue every 5 (five) minutes as needed for chest pain. 06/24/19 09/22/19  Richardson Dopp T, PA-C  Semaglutide (RYBELSUS) 14 MG TABS Take 1 tablet by mouth daily before breakfast. 06/06/19   Elayne Snare, MD  Venlafaxine HCl 150 MG TB24 Take 150 mg by mouth daily.     [provider]    Allergies    Hydrocodone, Invokana [canagliflozin], Imdur [isosorbide nitrate], Rosuvastatin, Simvastatin, and Atorvastatin  Review of Systems   Review of Systems  Constitutional: Positive for chills. Negative for fever.  HENT: Negative for trouble swallowing.   Eyes: Positive for visual disturbance (blurry vision of left eye).  Respiratory: Positive for shortness of breath.   Cardiovascular: Positive for chest pain.  Gastrointestinal: Positive for nausea. Negative for abdominal pain, diarrhea and vomiting.  Musculoskeletal: Positive for back pain.  All other systems reviewed and are negative.   Physical Exam Updated Vital Signs BP (!) 164/78 (BP Location: Right Arm)   Pulse 78   Temp 98.9 F (37.2 C) (Oral)   Resp 18   Ht 6' (1.829 m)   Wt (!) 117.9 kg   SpO2 98%   BMI 35.26 kg/m   Physical Exam Vitals and nursing note reviewed.  Constitutional:      General: She is not in acute distress.    Appearance: She is not ill-appearing, toxic-appearing or diaphoretic.  HENT:     Head: Normocephalic and atraumatic.  Eyes:     Pupils: Pupils are equal, round, and reactive to light.  Cardiovascular:     Rate and Rhythm: Normal rate and regular rhythm.     Heart sounds: Normal heart sounds. No murmur heard.   Pulmonary:     Effort: Pulmonary effort is normal. No respiratory distress.     Breath sounds:  Normal breath sounds. No decreased breath sounds, wheezing, rhonchi or rales.  Abdominal:     Palpations: Abdomen is soft.     Tenderness: There is no abdominal tenderness.  There is no guarding or rebound.  Musculoskeletal:     Right lower leg: No tenderness.     Left lower leg: No tenderness.     Comments: Left arm tenderness on internal rotation  Skin:    General: Skin is warm and dry.  Neurological:     General: No focal deficit present.     Mental Status: She is alert and oriented to person, place, and time.     Cranial Nerves: No cranial nerve deficit.     Motor: No weakness.  Psychiatric:        Mood and Affect: Mood normal.        Behavior: Behavior normal.     ED Results / Procedures / Treatments   Labs (all labs ordered are listed, but only abnormal results are displayed) Labs Reviewed  BASIC METABOLIC PANEL - Abnormal; Notable for the following components:      Result Value   Glucose, Bld 172 (*)    All other components within normal limits  CBC  TROPONIN I (HIGH SENSITIVITY)  TROPONIN I (HIGH SENSITIVITY)    EKG EKG Interpretation  Date/Time:  Sunday November 16 2019 09:13:44 EDT Ventricular Rate:  85 PR Interval:  158 QRS Duration: 80 QT Interval:  360 QTC Calculation: 428 R Axis:   43 Text Interpretation: Normal sinus rhythm Possible Inferior infarct , age undetermined Abnormal ECG No significant change since last tracing Confirmed by Theotis Burrow [phone]) on 11/16/2019 11:41:04 AM   Radiology DG Chest 2 View  Result Date: 11/16/2019 CLINICAL DATA:  Chest pain. Left arm and face numbness. Left eye blurry vision. Cold sweats, headache and diarrhea. EXAM: CHEST - 2 VIEW COMPARISON:  07/22/2019 FINDINGS: The heart size and mediastinal contours are within normal limits. Both lungs are clear. The visualized skeletal structures are unremarkable. IMPRESSION: No active cardiopulmonary disease. Electronically Signed   By: Kerby Moors M.D.   On: 11/16/2019 09:51   CT HEAD WO CONTRAST  Result Date: 11/16/2019 CLINICAL DATA:  Left-sided facial numbness, left-sided weakness, and left arm drift. EXAM: CT HEAD WITHOUT CONTRAST TECHNIQUE:  Contiguous axial images were obtained from the base of the skull through the vertex without intravenous contrast. COMPARISON:  Head MRI 07/03/2018 FINDINGS: Brain: No acute infarct, intracranial hemorrhage, midline shift, or extra-axial fluid collection is identified. The ventricles are normal in size. Sequelae of right retromastoid craniectomy are again identified for resection of a posterior fossa meningioma. Residual meningioma in the right cerebellopontine angle cistern is grossly similar in size to the prior MRI measuring approximately 1.8 cm. Mild right cerebellar encephalomalacia is noted. There has been prior transsphenoidal pituitary surgery, and a 9 mm posterior sellar mass is also similar to the prior MRI. Vascular: Calcified atherosclerosis at the skull base. No hyperdense vessel. Skull: Postsurgical changes. No acute fracture or suspicious osseous lesion. Sinuses/Orbits: Postsurgical changes from transsphenoidal pituitary resection. Mild mucosal thickening in the frontal, ethmoid, and maxillary sinuses bilaterally. Clear mastoid air cells. Unremarkable orbits. Other: None. IMPRESSION: 1. No evidence of acute intracranial abnormality. 2. Postsurgical changes from pituitary and posterior fossa surgeries with residual masses as above, similar to the prior MRI. Electronically Signed   By: Logan Bores M.D.   On: 11/16/2019 11:01    Procedures Procedures (including critical care time)  Medications Ordered in ED Medications  sodium chloride flush (NS) 0.9 % injection 3 mL (has no administration in time range)    ED Course  I have reviewed the triage vital signs and the nursing notes.  Pertinent labs & imaging results that were available during my care of the patient were reviewed by me and considered in my medical decision making (see chart for details).    MDM Rules/Calculators/A&P                          Shevon Sian is a 64 y/o F with a PMHx of CAD, ischemic cardiomyopathy, HTN, HLD,  DM, TIA, Meningioma, Breast Cancer, and pituitary tumor who presents to the ED via EMS after two episodes of shortness of breath and chest tightness that resolved after the patient took her home nitroglycerin. She also notes facial tightness, left arm tingling, and a headache. She states the reason she came in today was not make sure she was not having a stroke.   Her chest pain and shortness of breath resolved with nitroglycerin. She has no EKG changes when compared to prior EKG. No active cardiopulmonary disease on chest x-ray. Head CT w/o contrast, no intracranial abnormality. On laboratory data, patient has unremarkable cbc, bmp shows no indication of electrolyte abnormality. Two negative troponins. Suspect patient's chest pain/shortness of breath are related to her elevated blood pressure.   No evidence of CVA on CT head without contrast. Patient does have risk factors for cva/tia, however, she is medically optimized at this time. Consulted Dr. Suan Halter of cardiology to discuss patients visit as well as make him aware that patient will follow up with them. They recommend hctz 25 mg daily for the patient and to have her follow up in her primary care or their office to recheck kidney function and electrolytes since she is starting HCTZ.   Low suspicion for ACS, CVA, TIA, esophageal rupture, pneumothorax, pericarditis. Do not believe patient warrants admission at this time.   Recommend patient follow up with cardiologist as well as primary care provider this week. Also recommend patient call 911 or return to closest ED if she begins having worsening symptoms, her symptoms return, or new symptoms arise.   Final Clinical Impression(s) / ED Diagnoses Final diagnoses:  None    Rx / DC Orders ED Discharge Orders    None       Riesa Pope, MD 11/16/19 [place], Wenda Overland, MD 11/20/19 [phone]

## 2019-11-16 NOTE — ED Triage Notes (Addendum)
Pt to triage via [facility] from home.  Reports SOB and chest pain since waking up this morning.  Took 1 NTG with some relief.  Also reports new L sided facial numbness since waking up.  LKW 10pm last night.  Pt has L arm drift when she closes her eyes.  Reports history of L sided weakness.  When she opens her eyes and concentrates on keeping both arms up she doesn't have a drift.  Reports COVID in March.  EMS administered ASA 324mg .  18g L AC.   CBG 132.  Pt states she stayed in bed all day yesterday because her BP was high.  VAN negative.

## 2019-11-16 NOTE — Discharge Instructions (Addendum)
Thank you for allowing [place] to assist in your care today. Please call your primary care providers office tomorrow to schedule an appointment for you to follow up on starting your new blood pressure medication HCTZ.   Please call 911 or return to your closest emergency department if you have any chest pain, shortness of breath, back pain, new numbness or tingling. Please continue to take all of your prescribed medications.

## 2019-11-17 NOTE — Telephone Encounter (Signed)
Is she willing to try the freestyle libre version 2?

## 2019-11-17 NOTE — Telephone Encounter (Signed)
Patient was seen in ED. 

## 2019-11-17 NOTE — Telephone Encounter (Signed)
MyChart message sent to pt inquiring about trying Freestyle [place] 2

## 2019-11-17 NOTE — Telephone Encounter (Signed)
Received fax from pt's insurance with determination from appeal on coverage of Dexcom G6.  Appeal was still denied. Insurance indicates that their decision will not be reverseeed.

## 2019-11-17 NOTE — Telephone Encounter (Signed)
Please make sure patient knows that acute, severe or concerning symptoms should not be reported through [place].  These messages may not be seen for 2 days if received over the weekend.  She should call the office to reach the on call provider or call 911 for symptoms like this in the future.  Please make sure she has follow up this week with Dr. Burt Knack or APP. Richardson Dopp, PA-C    11/17/2019 9:49 AM

## 2019-11-22 ENCOUNTER — Encounter: Payer: Self-pay | Admitting: Physician Assistant

## 2019-11-22 NOTE — Progress Notes (Deleted)
Cardiology Office Note    Date:  11/22/2019   ID:  Destiny Beasley, DOB 10-28-1955, MRN [id number]  PCP:  Destiny Cruel, MD  Cardiologist:  Destiny Mocha, MD  Electrophysiologist:  None   Chief Complaint: f/u blood pressure  History of Present Illness:   Destiny Beasley is a 64 y.o. female with history of Coronary artery disease s/p inf-lat STEMI 0/9381 (complicated by cardiogenic shock, acute heart failure, CHB, s/p DES to LCx), chronic systolic CHF, ischemic cardiomyopathy, DM, prior TIA, OSA, breast CA, HTN, HLD (intolerant of statins and Repatha), Covid-19 in 06/2019, mild carotid plaque (1-39% bilaterally in 2017), normal ABIs 07/2019. Her last stress test in 01/2018 showed EF 35%, prior scar in the basal inferolateral, mid inferolateral and apical lateral location as well as defect of moderate severity present in the basal inferior, mid inferior and apical inferior location, partially reversible and consistent with infarct with mild peri infarct ischemia. Dr. Burt Beasley felt this was consistent with her known cath results from 06/2017 (outlined below) and recommended medical therapy. Last echo 08/2018 showed EF 82-99%, mild diastolic dysfunction and mild LVH. She saw Destiny Dopp PA-C on 10/29/19 and was doing well from cardiac standpoint. She reported continued to have significant bilateral leg pain that was felt to be related to COVID-19. She has seen orthopedics and had been treated with steroid taper with some improvement. She was not able to tolerate once weekly rosuvastatin (myalgias) and also stopped hydralazine (fatigue). Destiny Beasley's note indicates she also had difficulty with spironolactone (wheezing vs fatigue). He comments there may have been an intolerance to [facility] as well although I am not able to find further commentary when searching the chart. She was seen in the ED 11/16/19 for elevated BP, multiple complaints including tingling under left arm, headache, chest tightess, left facial  tightness, foggy vision and dull headache. CT head was nonacute and no other signs/sx of TIA/stroke. HCTZ added. Troponins were negative.  bmet today, tsh HTN clinic? return labs 12/24/19 osa?  CAD with recent chest pain context of elevated blood pressure Chronic systolic CHF Essential HTN with multiple prior medication intolerances Hyperlipidemia goal LDL<70  Labwork independently reviewed: 11/16/19 CBC, BMET wnl except glucose 172, with K 4.3, Cr 0.88 08/2019 A1c 9.2 05/2019 LDL 87 2017 TSH wnl  Past Medical History:  Diagnosis Date  . Anemia   . Anxiety   . CAD in native artery    a. Inf STEMI 06/7167 complicated by cardiogenic shock/complete heart block s/p emergent cath showing culprit large dominant LCx s/p aspiration thrombectomy and DES, EF 50% by cath, 40-45% by echo.  . Chronic systolic heart failure ([place]) 06/25/2017   Echo 11/19: mild LVH, EF 40-45, inf-lat HK, Gr 1 DD, trivial MR  . Complete heart block, transient ([place])    a. 06/2017 in setting of acute inf MI -> resolved after PCI.  Marland Kitchen COVID-19   . Diabetes mellitus   . Goiter   . History of radiation therapy 06/14/2016 - 07/19/2016   Right Breast 50 Gy 25 fractions  . Hypercholesteremia   . Hypertension   . Ischemic cardiomyopathy    a. EF 50% by cath and 40-45% by echo 06/2017.  . Leg pain    ABIs 07/2019: Normal (R 1.27; L 1.25)  . Malignant neoplasm of upper-outer quadrant of right female breast ([place]) 02/24/2016  . Meningioma ([place])   . Nuclear stress test    Nuclear stress test 10/19: EF 35, inferolateral, apical scar; inferior, apical inferior  infarct with mild peri-infarct ischemia; high risk  . Obesity   . Personal history of chemotherapy   . Personal history of radiation therapy   . Pituitary tumor   . Seasonal asthma   . Sleep apnea    does not use every night  . Stroke [facility])    TIA - Dec 1 st, 2017    Past Surgical History:  Procedure Laterality Date  . ABDOMINAL HYSTERECTOMY    . BRAIN MENINGIOMA  EXCISION  2005  . BREAST BIOPSY    . BREAST LUMPECTOMY Right    2017  . BREAST LUMPECTOMY WITH RADIOACTIVE SEED LOCALIZATION Right 04/20/2016   Procedure: RIGHT BREAST LUMPECTOMY WITH RADIOACTIVE SEED LOCALIZATION;  Surgeon: Destiny Skates, MD;  Location: [facility];  Service: General;  Laterality: Right;  . BREAST REDUCTION SURGERY Bilateral 10/23/2016   Procedure: BILATERAL BREAST REDUCTION WITH LIPOSUCTION ASSISTANCE;  Surgeon: Destiny Polio, MD;  Location: [place];  Service: Plastics;  Laterality: Bilateral;  . CESAREAN SECTION    . CORONARY STENT INTERVENTION N/A 06/25/2017   Procedure: CORONARY STENT INTERVENTION;  Surgeon: Destiny Mocha, MD;  Location: [place] [facility];  Service: Cardiovascular;  Laterality: N/A;  . CORONARY/GRAFT ACUTE MI REVASCULARIZATION N/A 06/25/2017   Procedure: Coronary/Graft Acute MI Revascularization;  Surgeon: Destiny Mocha, MD;  Location: [place] [facility];  Service: Cardiovascular;  Laterality: N/A;  . FOOT SURGERY Bilateral 2016   hammer toe surgery  . LEFT HEART CATH AND CORONARY ANGIOGRAPHY N/A 06/25/2017   Procedure: LEFT HEART CATH AND CORONARY ANGIOGRAPHY;  Surgeon: Destiny Mocha, MD;  Location: [place] [facility];  Service: Cardiovascular;  Laterality: N/A;  . MENISCUS REPAIR Left 2015  . PITUITARY SURGERY    . REDUCTION MAMMAPLASTY    . RIGHT HEART CATH N/A 06/25/2017   Procedure: RIGHT HEART CATH;  Surgeon: Destiny Mocha, MD;  Location: [place] [facility];  Service: Cardiovascular;  Laterality: N/A;  . THYROID SURGERY      Current Medications: No outpatient medications have been marked as taking for the 11/26/19 encounter (Appointment) with Destiny Pitter, PA-C.   ***   Allergies:   Hydrocodone, Invokana [canagliflozin], Hydralazine, Imdur [isosorbide nitrate], Rosuvastatin, Simvastatin, Spironolactone, and Atorvastatin   Social History   Socioeconomic History  . Marital status: Single    Spouse name:  Not on file  . Number of children: Not on file  . Years of education: Not on file  . Highest education level: Not on file  Occupational History  . Not on file  Tobacco Use  . Smoking status: Never Smoker  . Smokeless tobacco: Never Used  Vaping Use  . Vaping Use: Never used  Substance and Sexual Activity  . Alcohol use: Yes    Alcohol/week: 1.0 standard drink    Types: 1 Glasses of wine per week    Comment: 1 glass monthly  . Drug use: No  . Sexual activity: Not on file    Comment: no cycles; pt had hyst  Other Topics Concern  . Not on file  Social History Narrative  . Not on file   Social Determinants of Health   Financial Resource Strain:   . Difficulty of Paying Living Expenses:   Food Insecurity:   . Worried About Charity fundraiser in the Last Year:   . Arboriculturist in the Last Year:   Transportation Needs:   . Film/video editor (Medical):   Marland Kitchen Lack of Transportation (Non-Medical):  Physical Activity:   . Days of Exercise per Week:   . Minutes of Exercise per Session:   Stress:   . Feeling of Stress :   Social Connections:   . Frequency of Communication with Friends and Family:   . Frequency of Social Gatherings with Friends and Family:   . Attends Religious Services:   . Active Member of Clubs or Organizations:   . Attends Archivist Meetings:   Marland Kitchen Marital Status:      Family History:  The patient's ***family history includes Diabetes in her mother; Hyperlipidemia in her mother; Hypertension in her mother.  ROS:   Please see the history of present illness. Otherwise, review of systems is positive for ***.  All other systems are reviewed and otherwise negative.    EKGs/Labs/Other Studies Reviewed:    Studies reviewed are outlined and summarized above. Reports included below if pertinent.  2D echo 08/2018 1. The left ventricle has mild-moderately reduced systolic function, with  an ejection fraction of 40-45%. The cavity size was  normal. There is  mildly increased left ventricular wall thickness. Left ventricular  diastolic Doppler parameters are consistent  with impaired relaxation.  2. The right ventricle has normal systolic function. The cavity was  normal.  3. The mitral valve is grossly normal.  4. The tricuspid valve is grossly normal.  5. The aortic valve is tricuspid. No stenosis of the aortic valve.  6. Akinesis of the inferolateral wall with overall mild to moderate LV  dysfunction; mild diastolic dysfunction; mild LVH.  LHC 06/2017  There is mild to moderate left ventricular systolic dysfunction.  LV end diastolic pressure is moderately elevated.  The left ventricular ejection fraction is 45-50% by visual estimate.  Prox LAD to Mid LAD lesion is 30% stenosed.  Prox Cx lesion is 100% stenosed.  A drug-eluting stent was successfully placed using a STENT SYNERGY DES 4X20.  Post intervention, there is a 0% residual stenosis.   1.  Acute inferolateral STEMI involving a large, dominant left circumflex complicated by cardiogenic shock and complete heart block 2.  Successful PCI of the left circumflex using aspiration thrombectomy and drug-eluting stent implantation 3.  Mild nonobstructive disease in the left main, LAD, and nondominant RCA without significant stenosis in any of those vessels 4.  Mild segmental contraction abnormality of the left ventricle consistent with a left circumflex/inferior infarct, with mild segmental LV systolic dysfunction, LVEF estimated at 50% 5.  Acute systolic heart failure with elevated LVEDP and mildly elevated right heart pressures  Recommendations: The patient will be transferred to the cardiac ICU for continued post MI care.  We will continue Cangrelor for 2 hours.  She is loaded with Brilinta 180 mg at the completion of the procedure.  Post MI medical therapy will be instituted.  We will wean her off of dopamine as tolerated.      EKG:  EKG is ordered today,  personally reviewed, demonstrating ***  Recent Labs: 11/16/2019: BUN 12; Creatinine, Ser 0.88; Hemoglobin 13.7; Platelets 274; Potassium 4.3; Sodium 140  Recent Lipid Panel    Component Value Date/Time   CHOL 223 (H) 04/23/2019 0714   TRIG 138 04/23/2019 0714   HDL 57 04/23/2019 0714   CHOLHDL 3.9 04/23/2019 0714   CHOLHDL 2 08/12/2018 0944   VLDL 19.2 08/12/2018 0944   LDLCALC 141 (H) 04/23/2019 0714   LDLDIRECT 87.0 06/04/2019 0835    PHYSICAL EXAM:    VS:  There were no vitals taken for this  visit.  BMI: There is no height or weight on file to calculate BMI.  GEN: Well nourished, well developed, in no acute distress HEENT: normocephalic, atraumatic Neck: no JVD, carotid bruits, or masses Cardiac: ***RRR; no murmurs, rubs, or gallops, no edema  Respiratory:  clear to auscultation bilaterally, normal work of breathing GI: soft, nontender, nondistended, + BS MS: no deformity or atrophy Skin: warm and dry, no rash Neuro:  Alert and Oriented x 3, Strength and sensation are intact, follows commands Psych: euthymic mood, full affect  Wt Readings from Last 3 Encounters:  11/16/19 (!) 260 lb (117.9 kg)  10/29/19 259 lb (117.5 kg)  10/07/19 260 lb 12.8 oz (118.3 kg)     ASSESSMENT & PLAN:   1. ***  Disposition: F/u with ***   Medication Adjustments/Labs and Tests Ordered: Current medicines are reviewed at length with the patient today.  Concerns regarding medicines are outlined above. Medication changes, Labs and Tests ordered today are summarized above and listed in the Patient Instructions accessible in Encounters.   Signed, Destiny Pitter, PA-C  11/22/2019 3:01 PM    [facility] [place], [place], [place]  [address] Phone: [phone]; Fax: [phone]

## 2019-11-26 ENCOUNTER — Ambulatory Visit: Payer: 59 | Admitting: Physician Assistant

## 2019-11-26 DIAGNOSIS — I10 Essential (primary) hypertension: Secondary | ICD-10-CM

## 2019-11-26 NOTE — Telephone Encounter (Signed)
Arrange follow up BMET in next 7-10 days (Dx. HTN). Make sure she has refills. Monitor BP once daily and notify if consistently > 130/80. Richardson Dopp, PA-C    11/26/2019 5:54 PM

## 2019-11-28 ENCOUNTER — Other Ambulatory Visit: Payer: Self-pay

## 2019-11-28 ENCOUNTER — Telehealth: Payer: Self-pay

## 2019-11-28 ENCOUNTER — Encounter: Payer: Self-pay | Admitting: Pharmacist

## 2019-11-28 DIAGNOSIS — E1165 Type 2 diabetes mellitus with hyperglycemia: Secondary | ICD-10-CM

## 2019-11-28 DIAGNOSIS — Z794 Long term (current) use of insulin: Secondary | ICD-10-CM

## 2019-11-28 MED ORDER — HYDROCHLOROTHIAZIDE 25 MG PO TABS: 25.0000 mg | ORAL_TABLET | Freq: Every day | ORAL | 3 refills | Status: DC

## 2019-11-28 MED ORDER — TRESIBA FLEXTOUCH 200 UNIT/ML ~~LOC~~ SOPN: 60.0000 [IU] | PEN_INJECTOR | Freq: Two times a day (BID) | SUBCUTANEOUS | 2 refills | Status: DC

## 2019-11-28 MED ORDER — TOUJEO MAX SOLOSTAR 300 UNIT/ML ~~LOC~~ SOPN: 65.0000 [IU] | PEN_INJECTOR | Freq: Two times a day (BID) | SUBCUTANEOUS | 2 refills | Status: DC

## 2019-11-28 NOTE — Telephone Encounter (Signed)
This can only be submitted after her next office visit documenting poor control. In the meantime she will need to increase her Toujeo by 15 units twice daily, please call

## 2019-11-28 NOTE — Telephone Encounter (Signed)
Outpatient Medication Detail   Disp Refills Start End   insulin glargine, 2 Unit Dial, (TOUJEO MAX SOLOSTAR) 300 UNIT/ML Solostar Pen 39 mL 2 11/28/2019    Sig - Route: Inject 65 Units into the skin 2 (two) times daily. - Subcutaneous   Sent to pharmacy as: insulin glargine, 2 Unit Dial, (TOUJEO MAX SOLOSTAR) 300 UNIT/ML Solostar Pen   E-Prescribing Status: Receipt confirmed by pharmacy (11/28/2019  2:54 PM EDT)

## 2019-11-28 NOTE — Telephone Encounter (Signed)
DENIAL  Medication: [place]: [facility] PA response: DENIED Rationale: Must provide inadequate response or failure to Lantus and Toujeo by submitting medical records.  Documents have been given to Dr. Dwyane Dee for him to review and address. Included is appeal form for documentation submission.

## 2019-11-28 NOTE — Telephone Encounter (Signed)
PA has been initiated and will take 72 hours to receive a response. Please advise how to proceed until we receive a response from her insurance.

## 2019-11-28 NOTE — Telephone Encounter (Signed)
PRIOR AUTHORIZATION  PA initiation date: 11/28/19  Medication: [place]: [facility] Submission completed electronically through [employer] My Meds: Yes  Will await insurance response re: approval/denial.  [employer] (Key: [id number])  Your information has been sent to [facility]. [employer] (Key: [id number])  This request has received a N/A outcome.  Please note any additional information provided by [facility] at the bottom of this request.

## 2019-11-28 NOTE — Telephone Encounter (Signed)
PRIOR AUTHORIZATION  PA initiation date: 11/28/19  Medication: [place]: [facility] Submission completed electronically through [employer] My Meds: Yes  Will await insurance response re: approval/denial.

## 2019-11-28 NOTE — Telephone Encounter (Signed)
ENTERED IN ERROR

## 2019-12-03 ENCOUNTER — Other Ambulatory Visit: Payer: Self-pay

## 2019-12-03 ENCOUNTER — Other Ambulatory Visit: Payer: Self-pay | Admitting: Endocrinology

## 2019-12-03 ENCOUNTER — Other Ambulatory Visit (INDEPENDENT_AMBULATORY_CARE_PROVIDER_SITE_OTHER): Payer: 59

## 2019-12-03 ENCOUNTER — Other Ambulatory Visit: Payer: 59 | Admitting: *Deleted

## 2019-12-03 DIAGNOSIS — E1165 Type 2 diabetes mellitus with hyperglycemia: Secondary | ICD-10-CM | POA: Diagnosis not present

## 2019-12-03 DIAGNOSIS — Z794 Long term (current) use of insulin: Secondary | ICD-10-CM

## 2019-12-03 DIAGNOSIS — I10 Essential (primary) hypertension: Secondary | ICD-10-CM

## 2019-12-03 LAB — BASIC METABOLIC PANEL
BUN/Creatinine Ratio: 15 (ref 12–28)
BUN: 15 mg/dL (ref 8–27)
CO2: 26 mmol/L (ref 20–29)
Calcium: 10 mg/dL (ref 8.7–10.3)
Chloride: 100 mmol/L (ref 96–106)
Creatinine, Ser: 1.02 mg/dL — ABNORMAL HIGH (ref 0.57–1.00)
GFR calc Af Amer: 69 mL/min/{1.73_m2} (ref >59)
GFR calc non Af Amer: 60 mL/min/{1.73_m2} (ref >59)
Glucose: 182 mg/dL — ABNORMAL HIGH (ref 65–99)
Potassium: 4.2 mmol/L (ref 3.5–5.2)
Sodium: 140 mmol/L (ref 134–144)

## 2019-12-03 LAB — GLUCOSE, RANDOM: Glucose, Bld: 155 mg/dL — ABNORMAL HIGH (ref 70–99)

## 2019-12-03 LAB — HEMOGLOBIN A1C: Hgb A1c MFr Bld: 9.3 % — ABNORMAL HIGH (ref 4.6–6.5)

## 2019-12-04 LAB — FRUCTOSAMINE: Fructosamine: 330 umol/L — ABNORMAL HIGH (ref 0–285)

## 2019-12-05 ENCOUNTER — Telehealth: Payer: Self-pay | Admitting: Pharmacist

## 2019-12-05 NOTE — Telephone Encounter (Signed)
Received phone call from [facility] that praluent was approved through 06/06/20.

## 2019-12-06 ENCOUNTER — Other Ambulatory Visit: Payer: Self-pay | Admitting: Endocrinology

## 2019-12-10 ENCOUNTER — Telehealth (INDEPENDENT_AMBULATORY_CARE_PROVIDER_SITE_OTHER): Payer: 59 | Admitting: Endocrinology

## 2019-12-10 ENCOUNTER — Encounter: Payer: Self-pay | Admitting: Endocrinology

## 2019-12-10 ENCOUNTER — Other Ambulatory Visit: Payer: Self-pay

## 2019-12-10 VITALS — Wt 260.0 lb

## 2019-12-10 DIAGNOSIS — E1165 Type 2 diabetes mellitus with hyperglycemia: Secondary | ICD-10-CM

## 2019-12-10 DIAGNOSIS — Z794 Long term (current) use of insulin: Secondary | ICD-10-CM | POA: Diagnosis not present

## 2019-12-10 MED ORDER — FREESTYLE LIBRE 2 READER DEVI: 0 refills | Status: DC

## 2019-12-10 MED ORDER — LANTUS SOLOSTAR 100 UNIT/ML ~~LOC~~ SOPN
50.0000 [IU] | PEN_INJECTOR | Freq: Two times a day (BID) | SUBCUTANEOUS | 1 refills | Status: DC
Start: 2019-12-10 — End: 2020-03-08

## 2019-12-10 MED ORDER — FREESTYLE LIBRE 2 SENSOR MISC: 1 refills | Status: DC

## 2019-12-10 NOTE — Progress Notes (Signed)
Patient ID: Destiny Beasley, female   DOB: 1955/12/01, 64 y.o.   MRN: [id number]  I connected with the above-named patient by video enabled telemedicine application and verified that I am speaking with the correct person. The patient was explained the limitations of evaluation and management by telemedicine and the availability of in person appointments.  Patient also understood that there may be a patient responsible charge related to this service . Location of the patient: Patient's home . Location of the provider: Physician office Only the patient and myself were participating in the encounter The patient understood the above statements and agreed to proceed.   Reason for Appointment: Type II Diabetes follow-up   History of Present Illness    Date of diagnosis: 10/2008  Previous history: She had markedly increased blood sugars at diagnosis and was tried on oral hypoglycemic drugs and Victoza for about 9 months before starting an insulin.  Since 2011 she had been on basal bolus insulin regimen Previously had had difficulty controlling her diabetes mostly because of difficulty with compliance with various aspects of self-care.  Previous regimen: Toujeo 60 units at 8 am . Apidra 20 units at times  She was started on the Omnipod insulin pump on 09/11/16  Recent history:   Insulin regimen: HUMALOG U-100, carbohydrate coverage 1: 8, unknown sensitivity Lantus insulin 30 to 50 units twice daily  Non-insulin hypoglycemic drugs: Rybelsus 14 mg daily  Her A1c has been consistently high, now 9.3         Current management, blood sugar patterns and problems:  She states that she was getting panic attacks with taking Toujeo and refuses to take this  Tyler Aas was denied by her insurance as also the OmniPod insulin pump  She was also using the Dexcom which was again denied by her insurance, reason was stated as claiming that the patient was noncompliant which is incorrect  She has  been using the Inpen since mid July since she was not able to get the pump  With this she has been entering her carbohydrate intake fairly regularly along with insulin doses  She is also entering her basal insulin Information in the app; however not clear what her sensitivity is for the Humalog  Since she was not familiar with the use of the pen she has not been bolusing before starting to eat consistently and sometimes only when the blood sugar is high  Also frequency of blood sugar monitoring has been better, checking 3 times a day according to the data from the pen  GLUCOSE patterns: She has somewhat variable mostly high blood sugars in the mornings, usually high readings in the midday or afternoon but not enough that recently in the evenings recently  EXERCISE: Has been trying to get on her treadmill about 3 days a week  Side effects from medications: Candidiasis from Invokana    PRE-MEAL Fasting  11 AM Dinner Bedtime Overall  Glucose range:  97-273  100-316  212  192   Mean/median:      218   POST-MEAL PC Breakfast  2-3 PM PC Dinner  Glucose range:  ,  219-244   Mean/median:       Previous data:  CONTINUOUS GLUCOSE MONITORING RECORD INTERPRETATION      CGM use % of time  77  Average and SD  227+/-66  Time in range       23%  % Time Above 180  77  % Time above 250  36  %  Time Below target  0.1     Glycemic patterns summary: Overall blood sugars are highly variable and persistently high, on an average blood sugars are above 180 target throughout the day and night   Dietician visit: Most recent: 04/2010 Weight control:  Wt Readings from Last 3 Encounters:  11/16/19 (!) 260 lb (117.9 kg)  10/29/19 259 lb (117.5 kg)  10/07/19 260 lb 12.8 oz (118.3 kg)         Diabetes labs:  Lab Results  Component Value Date   HGBA1C 9.3 (H) 12/03/2019   HGBA1C 9.2 (H) 08/26/2019   HGBA1C 9.3 (H) 06/04/2019   Lab Results  Component Value Date   MICROALBUR <0.7 08/26/2019    LDLCALC 141 (H) 04/23/2019   CREATININE 1.02 (H) 12/03/2019      No visits with results within 1 Day(s) from this visit.  Latest known visit with results is:  Lab on 12/03/2019  Component Date Value Ref Range Status  . Glucose 12/03/2019 182* 65 - 99 mg/dL Final  . BUN 12/03/2019 15  8 - 27 mg/dL Final  . Creatinine, Ser 12/03/2019 1.02* 0.57 - 1.00 mg/dL Final  . GFR calc non Af Amer 12/03/2019 60  >59 mL/min/1.73 Final  . GFR calc Af Amer 12/03/2019 69  >59 mL/min/1.73 Final   Comment: **[facility] currently reports eGFR in compliance with the current**   recommendations of the [employer]. [facility] will   update reporting as new guidelines are published from the NKF-ASN   Task force.   . BUN/Creatinine Ratio 12/03/2019 15  12 - 28 Final  . Sodium 12/03/2019 140  134 - 144 mmol/L Final  . Potassium 12/03/2019 4.2  3.5 - 5.2 mmol/L Final  . Chloride 12/03/2019 100  96 - 106 mmol/L Final  . CO2 12/03/2019 26  20 - 29 mmol/L Final  . Calcium 12/03/2019 10.0  8.7 - 10.3 mg/dL Final     Allergies as of 12/10/2019      Reactions   Hydrocodone Other (See Comments)   Severe anxiety Confusion   Invokana [canagliflozin] Other (See Comments)   Constant yeast infections   Evolocumab    Injection site pain   Hydralazine    Fatigue   Imdur [isosorbide Nitrate] Other (See Comments)   Patient reported wheezing   Rosuvastatin    myalgias   Simvastatin    myalgias   Spironolactone    Wheezing vs fatigue?   Atorvastatin Other (See Comments)   DIZZINESS      Medication List       Accurate as of December 10, 2019  8:14 AM. If you have any questions, ask your nurse or doctor.        aspirin EC 81 MG tablet Take 1 tablet (81 mg total) by mouth daily.   carvedilol 12.5 MG tablet Commonly known as: COREG Take 1.5 tablets (18.75 mg total) by mouth 2 (two) times daily.   HumaLOG 100 UNIT/ML cartridge Generic drug: insulin lispro INJECT 50 UNITS INTO THE SKIN  EVERY MORNING AND EVERY NIGHT AT BEDTIME   hydrochlorothiazide 25 MG tablet Commonly known as: HYDRODIURIL Take 1 tablet (25 mg total) by mouth daily.   irbesartan 300 MG tablet Commonly known as: AVAPRO Take 300 mg by mouth daily.   metFORMIN 500 MG tablet Commonly known as: GLUCOPHAGE Take 2 tablets (1,000 mg total) by mouth 2 (two) times daily with a meal.   MULTIPLE VITAMIN PO Take 1 tablet daily by mouth.   nitroGLYCERIN 0.4  MG SL tablet Commonly known as: NITROSTAT Place 1 tablet (0.4 mg total) under the tongue every 5 (five) minutes as needed for chest pain.   Praluent 75 MG/ML Soaj Generic drug: Alirocumab Inject 1 pen into the skin every 14 (fourteen) days.   Rybelsus 14 MG Tabs Generic drug: Semaglutide Take 1 tablet by mouth daily before breakfast.   Toujeo Max SoloStar 300 UNIT/ML Solostar Pen Generic drug: insulin glargine (2 Unit Dial) Inject 65 Units into the skin 2 (two) times daily.   Venlafaxine HCl 150 MG Tb24 Take 150 mg by mouth daily.   VITAMIN D-3 PO Take 1 tablet by mouth daily.       Allergies:  Allergies  Allergen Reactions  . Hydrocodone Other (See Comments)    Severe anxiety Confusion   . Invokana [Canagliflozin] Other (See Comments)    Constant yeast infections  . Evolocumab     Injection site pain  . Hydralazine     Fatigue  . Imdur [Isosorbide Nitrate] Other (See Comments)    Patient reported wheezing  . Rosuvastatin     myalgias  . Simvastatin     myalgias  . Spironolactone     Wheezing vs fatigue?  Marland Kitchen Atorvastatin Other (See Comments)    DIZZINESS    Past Medical History:  Diagnosis Date  . Anemia   . Anxiety   . CAD in native artery    a. Inf STEMI 0/5397 complicated by cardiogenic shock/complete heart block s/p emergent cath showing culprit large dominant LCx s/p aspiration thrombectomy and DES, EF 50% by cath, 40-45% by echo.  . Chronic systolic heart failure ([place]) 06/25/2017   Echo 11/19: mild LVH, EF 40-45,  inf-lat HK, Gr 1 DD, trivial MR  . Complete heart block, transient ([place])    a. 06/2017 in setting of acute inf MI -> resolved after PCI.  Marland Kitchen COVID-19   . Diabetes mellitus   . Goiter   . History of radiation therapy 06/14/2016 - 07/19/2016   Right Breast 50 Gy 25 fractions  . Hypercholesteremia   . Hypertension   . Ischemic cardiomyopathy    a. EF 50% by cath and 40-45% by echo 06/2017.  . Leg pain    ABIs 07/2019: Normal (R 1.27; L 1.25)  . Malignant neoplasm of upper-outer quadrant of right female breast ([place]) 02/24/2016  . Meningioma ([place])   . Nuclear stress test    Nuclear stress test 10/19: EF 35, inferolateral, apical scar; inferior, apical inferior infarct with mild peri-infarct ischemia; high risk  . Obesity   . Personal history of chemotherapy   . Personal history of radiation therapy   . Pituitary tumor   . Seasonal asthma   . Sleep apnea    does not use every night  . Stroke [facility])    TIA - Dec 1 st, 2017    Past Surgical History:  Procedure Laterality Date  . ABDOMINAL HYSTERECTOMY    . BRAIN MENINGIOMA EXCISION  2005  . BREAST BIOPSY    . BREAST LUMPECTOMY Right    2017  . BREAST LUMPECTOMY WITH RADIOACTIVE SEED LOCALIZATION Right 04/20/2016   Procedure: RIGHT BREAST LUMPECTOMY WITH RADIOACTIVE SEED LOCALIZATION;  Surgeon: Fanny Skates, MD;  Location: [place];  Service: General;  Laterality: Right;  . BREAST REDUCTION SURGERY Bilateral 10/23/2016   Procedure: BILATERAL BREAST REDUCTION WITH LIPOSUCTION ASSISTANCE;  Surgeon: Cristine Polio, MD;  Location: [place];  Service: Plastics;  Laterality: Bilateral;  . CESAREAN SECTION    .  CORONARY STENT INTERVENTION N/A 06/25/2017   Procedure: CORONARY STENT INTERVENTION;  Surgeon: Sherren Mocha, MD;  Location: [place] CV LAB;  Service: Cardiovascular;  Laterality: N/A;  . CORONARY/GRAFT ACUTE MI REVASCULARIZATION N/A 06/25/2017   Procedure: Coronary/Graft Acute MI Revascularization;   Surgeon: Sherren Mocha, MD;  Location: [place] CV LAB;  Service: Cardiovascular;  Laterality: N/A;  . FOOT SURGERY Bilateral 2016   hammer toe surgery  . LEFT HEART CATH AND CORONARY ANGIOGRAPHY N/A 06/25/2017   Procedure: LEFT HEART CATH AND CORONARY ANGIOGRAPHY;  Surgeon: Sherren Mocha, MD;  Location: [place] CV LAB;  Service: Cardiovascular;  Laterality: N/A;  . MENISCUS REPAIR Left 2015  . PITUITARY SURGERY    . REDUCTION MAMMAPLASTY    . RIGHT HEART CATH N/A 06/25/2017   Procedure: RIGHT HEART CATH;  Surgeon: Sherren Mocha, MD;  Location: [place] CV LAB;  Service: Cardiovascular;  Laterality: N/A;  . THYROID SURGERY      Family History  Problem Relation Age of Onset  . Diabetes Mother   . Hyperlipidemia Mother   . Hypertension Mother     Social History:  reports that she has never smoked. She has never used smokeless tobacco. She reports current alcohol use of about 1.0 standard drink of alcohol per week. She reports that she does not use drugs.  Review of Systems:  Hypertension:  Variable control, blood pressure is managed by cardiologist with readings as follows:  She is taking Avapro and hydralazine as also Coreg  BP Readings from Last 3 Encounters:  11/16/19 (!) 161/116  10/29/19 130/80  10/07/19 140/86     Lipids: She was started on Praluent by cardiologist, apparently could not tolerate Repatha  Followed by cardiology, labs not available recently  She does have a history of MI  Lab Results  Component Value Date   CHOL 223 (H) 04/23/2019   HDL 57 04/23/2019   LDLCALC 141 (H) 04/23/2019   LDLDIRECT 87.0 06/04/2019   TRIG 138 04/23/2019   CHOLHDL 3.9 04/23/2019   Lab Results  Component Value Date   ALT 19 06/11/2018    Diabetic shoe prescription has been given previously      Examination:   There were no vitals taken for this visit.  There is no height or weight on file to calculate BMI.     ASSESSMENT/ PLAN:    Diabetes type 2  with obesity, BMI more than 35  See history of present illness for detailed discussion of current management, interpretation of her continuous glucose monitoring record, blood sugar patterns and problems identified  Her A1c has usually been consistently high and now 9.3  She has not been able to get the insulin pump or the Dexcom as discussed in detail above With now starting Lantus which she prefers compared to Toujeo her blood sugars are somewhat better but still mostly higher in the morning because of not having enough supply of Lantus at home to take the full doses She is also on mealtime insulin now with the inpen which is helping her with keeping up with the injections as well as calculating doses based on carbohydrates, not clear if she is using the correction factor as she did not work with the same programming the pen  Also discussed use of GLP-1 drugs, she is not taking Rybelsus since Victoza was not covered and she is taking this regularly She is not sure if she wants to switch to weekly injections as yet  Recommendations today:  She can try to continue the Lantus but take 50 units twice daily, new prescription will be sent  Discussed in detail the need for just to Lantus based on fasting blood sugars for the evening dosage  Stressed importance of taking the mealtime dose right before eating  She will try to use a carbohydrate ratio 1:5 since she is quite insulin resistant  She will have a correction factor of 1: 25 set up in her pen  She will try to check blood sugars after meals also at times  For now she can try to use the freestyle libre version 2 which was called in since she cannot get the Dexcom, this will help her with more consistent monitoring and ability to adjust her doses better  Also would prefer to have her get the OmniPod insulin pump and Dexcom in the future reauthorized from insurance for more accurate monitoring as well as more efficient insulin  delivery  Exercise at least every other day and more often if possible  Low-fat meals  LIPIDS: To be followed by cardiologist   There are no Patient Instructions on file for this visit. Elayne Snare 12/10/2019, 8:14 AM   This visit occurred during the SARS-CoV-2 public health emergency.  Safety protocols were in place, including screening questions prior to the visit, additional usage of staff PPE, and extensive cleaning of exam room while observing appropriate contact time as indicated for disinfecting solutions.

## 2019-12-14 ENCOUNTER — Other Ambulatory Visit: Payer: Self-pay | Admitting: Student

## 2019-12-17 ENCOUNTER — Telehealth: Payer: Self-pay

## 2019-12-17 NOTE — Telephone Encounter (Signed)
PA has been initiated via [email or web address] for [employer].   Key: [id number]  PA Case ID: [id number] Status: Sent to [facility] Drug: FreeStyle Libre 2 Reader device Form: [employer] Electronic Prior Authorization Form [phone] NCPDP)

## 2019-12-24 ENCOUNTER — Other Ambulatory Visit: Payer: 59 | Admitting: *Deleted

## 2019-12-24 ENCOUNTER — Other Ambulatory Visit: Payer: Self-pay

## 2019-12-24 DIAGNOSIS — E7800 Pure hypercholesterolemia, unspecified: Secondary | ICD-10-CM

## 2019-12-24 DIAGNOSIS — I10 Essential (primary) hypertension: Secondary | ICD-10-CM

## 2019-12-24 DIAGNOSIS — I2510 Atherosclerotic heart disease of native coronary artery without angina pectoris: Secondary | ICD-10-CM

## 2019-12-24 LAB — HEPATIC FUNCTION PANEL
ALT: 24 IU/L (ref 0–32)
AST: 20 IU/L (ref 0–40)
Albumin: 4.3 g/dL (ref 3.8–4.8)
Alkaline Phosphatase: 119 IU/L (ref 48–121)
Bilirubin Total: 0.3 mg/dL (ref 0.0–1.2)
Bilirubin, Direct: 0.08 mg/dL (ref 0.00–0.40)
Total Protein: 7.7 g/dL (ref 6.0–8.5)

## 2019-12-24 LAB — LIPID PANEL
Chol/HDL Ratio: 3 ratio (ref 0.0–4.4)
Cholesterol, Total: 144 mg/dL (ref 100–199)
HDL: 48 mg/dL (ref >39)
LDL Chol Calc (NIH): 78 mg/dL (ref 0–99)
Triglycerides: 95 mg/dL (ref 0–149)
VLDL Cholesterol Cal: 18 mg/dL (ref 5–40)

## 2019-12-25 ENCOUNTER — Telehealth: Payer: Self-pay | Admitting: *Deleted

## 2019-12-25 DIAGNOSIS — E7800 Pure hypercholesterolemia, unspecified: Secondary | ICD-10-CM

## 2019-12-25 NOTE — Telephone Encounter (Signed)
I spoke with patient and reviewed results with her.  She also saw comments from Richardson Dopp, [place] in my chart.  Patient confirms she is taking Praluent as listed.  Is not taking any other cholesterol medication.  She is aware to continue Praluent. Patient will come in for fasting lipid and liver profiles on March 2,2022

## 2019-12-25 NOTE — Telephone Encounter (Signed)
-----   Message from Liliane Shi, [place] sent at 12/25/2019  1:44 PM EDT ----- LFTs normal.  LDL improved and closer to goal.  Goal LDL is <70. According to previous notes, I had her on pravastatin and ezetimibe.  Most recent notes indicate that she is on Alirocumab (Praluent). Please confirm what she is taking for cholesterol and update her chart. PLAN:   -Continue current medications.  -Repeat fasting lipids, LFTs in 6 months. Richardson Dopp, PA-C    12/25/2019 1:38 PM

## 2019-12-26 ENCOUNTER — Telehealth: Payer: Self-pay | Admitting: *Deleted

## 2019-12-26 NOTE — Telephone Encounter (Signed)
[place] [employer] Insurance Company:[employer] Approval dates: 12/17/2019-12/16/2020

## 2020-01-20 DIAGNOSIS — I10 Essential (primary) hypertension: Secondary | ICD-10-CM

## 2020-01-20 NOTE — Telephone Encounter (Signed)
Increase Carvedilol to 25 mg twice daily. Keep track of BPs and send readings in 1 week. Richardson Dopp, PA-C    01/20/2020 4:51 PM

## 2020-01-21 NOTE — Telephone Encounter (Signed)
Let's try increasing Amlodipine to 10 mg once daily. Refer to HTN clinic and follow up in 2 weeks.  Richardson Dopp, PA-C    01/21/2020 5:00 PM

## 2020-01-21 NOTE — Telephone Encounter (Signed)
I called and spoke with patient, she is currently on Amlodipine 5 mg once a day.

## 2020-01-21 NOTE — Telephone Encounter (Signed)
See note from patient. Continue current dose of Carvedilol (18.75 mg twice daily). Please call patient. She used to be on Amlodipine.  I cannot find a reason why it was stopped. Start Amlodipine 2.5 mg once daily. Arrange HTN Clinic visit in the next 2 weeks. Richardson Dopp, PA-C    01/21/2020 10:15 AM

## 2020-01-22 MED ORDER — AMLODIPINE BESYLATE 10 MG PO TABS: 10.0000 mg | ORAL_TABLET | Freq: Every day | ORAL | 3 refills | Status: DC

## 2020-02-06 ENCOUNTER — Ambulatory Visit: Payer: 59

## 2020-02-09 NOTE — Progress Notes (Deleted)
Patient ID: [name]                 DOB: 11-28-1955                      MRN: [id number]     HPI: Destiny Beasley is a 64 y.o. female patient of Destiny Beasley referred by Destiny Dopp, PA to HTN clinic. PMH is significant for CAD s/p STEMI in 7371, systolic CHF, G6YI, prior TIA, HTN and HLD.  On 01/20/20, patients amlodipine was increased from 5 mg to 10 mg daily for BP management and referred to HTN clinic for follow-up. On 01/27/20, patent reported BP readings 143/77, 130/75, 129/75, 111/68, 125/77, 133/76 - no medication changes were made.  Patient presents today in good spirits. Reports ***  Compliance? Took meds this morning? When do you take your meds? Dizziness, headaches, blurred vision? History of swelling? Check Clinic BP? Home BP logs? If no logs, bring to next visit w/ BP cuff Go over BP goals Additional BP therapy if needed .  Diet??  Exercise??    Current HTN meds: amlodipine 10 mg daily, carvedilol 12.5 mg BID, HCTZ 25 mg daily, irbesartan 300 mg daily Previously tried: spironolactone 25 mg daily (wheezing vs. Fatigue), hydralazine 25, 50 mg TID (fatigue), Imdur 30 mg daily (wheezing) BP goal: <130/80 mmHg  Family History: The patient'sfamily history includes Diabetes in her mother; Hyperlipidemia in her mother; Hypertension in her mother.  Social History: never smoked, + ETOH  Diet:   Exercise: walk on treadmill 15-30 min every otherday  Home BP readings:   Wt Readings from Last 3 Encounters:  12/10/19 260 lb (117.9 kg)  11/16/19 (!) 260 lb (117.9 kg)  10/29/19 259 lb (117.5 kg)   BP Readings from Last 3 Encounters:  11/16/19 (!) 161/116  10/29/19 130/80  10/07/19 140/86   Pulse Readings from Last 3 Encounters:  11/16/19 83  10/29/19 89  10/07/19 88    Renal function: CrCl cannot be calculated (Patient's most recent lab result is older than the maximum 21 days allowed.).  Past Medical History:  Diagnosis Date  . Anemia   . Anxiety   .  CAD in native artery    a. Inf STEMI 12/4852 complicated by cardiogenic shock/complete heart block s/p emergent cath showing culprit large dominant LCx s/p aspiration thrombectomy and DES, EF 50% by cath, 40-45% by echo.  . Chronic systolic heart failure ([place]) 06/25/2017   Echo 11/19: mild LVH, EF 40-45, inf-lat HK, Gr 1 DD, trivial MR  . Complete heart block, transient ([place])    a. 06/2017 in setting of acute inf MI -> resolved after PCI.  Marland Kitchen COVID-19   . Diabetes mellitus   . Goiter   . History of radiation therapy 06/14/2016 - 07/19/2016   Right Breast 50 Gy 25 fractions  . Hypercholesteremia   . Hypertension   . Ischemic cardiomyopathy    a. EF 50% by cath and 40-45% by echo 06/2017.  . Leg pain    ABIs 07/2019: Normal (R 1.27; L 1.25)  . Malignant neoplasm of upper-outer quadrant of right female breast ([place]) 02/24/2016  . Meningioma ([place])   . Nuclear stress test    Nuclear stress test 10/19: EF 35, inferolateral, apical scar; inferior, apical inferior infarct with mild peri-infarct ischemia; high risk  . Obesity   . Personal history of chemotherapy   . Personal history of radiation therapy   . Pituitary tumor   .  Seasonal asthma   . Sleep apnea    does not use every night  . Stroke [facility])    TIA - Dec 1 st, 2017    Current Outpatient Medications on File Prior to Visit  Medication Sig Dispense Refill  . Alirocumab (PRALUENT) 75 MG/ML SOAJ Inject 1 pen into the skin every 14 (fourteen) days. 2 pen 11  . amLODipine (NORVASC) 10 MG tablet Take 1 tablet (10 mg total) by mouth daily. 90 tablet 3  . aspirin EC 81 MG tablet Take 1 tablet (81 mg total) by mouth daily. 90 tablet 3  . carvedilol (COREG) 12.5 MG tablet Take 1.5 tablets (18.75 mg total) by mouth 2 (two) times daily. 270 tablet 3  . Cholecalciferol (VITAMIN D-3 PO) Take 1 tablet by mouth daily.     . Continuous Blood Gluc Receiver (FREESTYLE LIBRE 2 READER) DEVI Use reader to monitor blood sugars continuously. 1 each 0  .  Continuous Blood Gluc Sensor (FREESTYLE LIBRE 2 SENSOR) MISC Use one sensor once every 14 days to monitor blood sugars continuously. 6 each 1  . HUMALOG 100 UNIT/ML cartridge INJECT 50 UNITS INTO THE SKIN EVERY MORNING AND EVERY NIGHT AT BEDTIME 30 mL 1  . hydrochlorothiazide (HYDRODIURIL) 25 MG tablet Take 1 tablet (25 mg total) by mouth daily. 30 tablet 3  . insulin glargine (LANTUS SOLOSTAR) 100 UNIT/ML Solostar Pen Inject 50 Units into the skin 2 (two) times daily. 30 mL 1  . irbesartan (AVAPRO) 300 MG tablet Take 300 mg by mouth daily.    . MULTIPLE VITAMIN PO Take 1 tablet daily by mouth.    . nitroGLYCERIN (NITROSTAT) 0.4 MG SL tablet Place 1 tablet (0.4 mg total) under the tongue every 5 (five) minutes as needed for chest pain. 25 tablet 0  . Semaglutide (RYBELSUS) 14 MG TABS Take 1 tablet by mouth daily before breakfast. 30 tablet 2  . Venlafaxine HCl 150 MG TB24 Take 150 mg by mouth daily.      No current facility-administered medications on file prior to visit.    Allergies  Allergen Reactions  . Hydrocodone Other (See Comments)    Severe anxiety Confusion   . Invokana [Canagliflozin] Other (See Comments)    Constant yeast infections  . Evolocumab     Injection site pain  . Hydralazine     Fatigue  . Imdur [Isosorbide Nitrate] Other (See Comments)    Patient reported wheezing  . Rosuvastatin     myalgias  . Simvastatin     myalgias  . Spironolactone     Wheezing vs fatigue?  Marland Kitchen Atorvastatin Other (See Comments)    DIZZINESS     Assessment/Plan:  1. Hypertension -   Destiny Beasley, PharmD PGY2 [facility] [address], [place], [place] [address] Phone: [phone]; Fax: [phone]

## 2020-02-09 NOTE — Progress Notes (Signed)
Patient ID: Destiny Beasley                 DOB: [birth year]                      MRN: [id number]      HPI: Destiny Beasley is a 64 y.o. female patient of Dr. Burt Knack referred by Richardson Dopp, PA to HTN clinic. PMH is significant forCAD s/p STEMI in 7209, systolic CHF w/ last EF 4/70 recovered to 40-45%, T2DM, prior TIA, HTN and HLD. Pt has previously followed with PharmD team for cholesterol management for which she currently takes Praluent injections.  On 01/20/20, patient's amlodipine was increased from 5 mg to 10 mg daily for BP management and she was referred to HTN clinic for follow-up. On 01/27/20, patent reported BP readings 143/77, 130/75, 129/75, 111/68, 125/77, 133/76 and no further medication changes were made.  The patient reports that she has been having anxiety attacks recently. These have been worse in the past and have been getting better. She has been hospitalized frequently in the past (latest on Sat/Sun) as a result of these attacks. The attacks involve tachycardia and flushing. She typically has to lay down on the ground when these occurs and they last about 1hr. Has alprazolam 0.25mg  when needed for an attack.  She tries not to take them because it makes her feel "groggy". The patient reports that her PCP also suggested increasing her venlafaxine to 225mg  daily (from 150mg  daily). States systolic BP typically increases to >170 SBP during an attack.   The patient reports that she has been doing fine since her recent amlodipine increase to 10mg . She denies swelling (none noted during visit today), headaches, or blurred vision. She does endorse worsening dizziness upon standing, stating that she often has to steady herself for a few minutes when standing in the morning and then sometimes in the afternoon once she returns from work. This dizziness has not resulted in falls. The patient reports taking all of her antihypertensive medications first thing in the morning (~7AM).   Current HTN  meds: amlodipine 10 mg daily, carvedilol 18.75 mg BID, HCTZ 25 mg daily, irbesartan 300 mg daily Previously tried: spironolactone 25 mg daily (wheezing vs. fatigue), hydralazine 25, 50 mg TID (fatigue), Imdur 30 mg daily (wheezing), Entresto (?), Invokana (frequent yeast infections) BP goal: <130/80 mmHg  Family History:The patient'sfamily history includes Diabetes in her mother; Hyperlipidemia in her mother; Hypertension in her mother.  Social History:never smoked, + ETOH  Diet: recent efforts to decrease sugar intake and processed food - increased intake fresh fruit and veggies, no canned foods, and fast food 1-2x/wk  Exercise: has not been exercising  - will start walking   Home BP readings: did not have readings in clinic - will send over   Wt Readings from Last 3 Encounters:  12/10/19 260 lb (117.9 kg)  11/16/19 (!) 260 lb (117.9 kg)  10/29/19 259 lb (117.5 kg)   BP Readings from Last 3 Encounters:  11/16/19 (!) 161/116  10/29/19 130/80  10/07/19 140/86   Pulse Readings from Last 3 Encounters:  11/16/19 83  10/29/19 89  10/07/19 88    Renal function: CrCl cannot be calculated (Patient's most recent lab result is older than the maximum 21 days allowed.).  Past Medical History:  Diagnosis Date  . Anemia   . Anxiety   . CAD in native artery    a. Inf STEMI 12/6281 complicated by cardiogenic shock/complete  heart block s/p emergent cath showing culprit large dominant LCx s/p aspiration thrombectomy and DES, EF 50% by cath, 40-45% by echo.  . Chronic systolic heart failure ([place]) 06/25/2017   Echo 11/19: mild LVH, EF 40-45, inf-lat HK, Gr 1 DD, trivial MR  . Complete heart block, transient ([place])    a. 06/2017 in setting of acute inf MI -> resolved after PCI.  Marland Kitchen COVID-19   . Diabetes mellitus   . Goiter   . History of radiation therapy 06/14/2016 - 07/19/2016   Right Breast 50 Gy 25 fractions  . Hypercholesteremia   . Hypertension   . Ischemic cardiomyopathy     a. EF 50% by cath and 40-45% by echo 06/2017.  . Leg pain    ABIs 07/2019: Normal (R 1.27; L 1.25)  . Malignant neoplasm of upper-outer quadrant of right female breast ([place]) 02/24/2016  . Meningioma ([place])   . Nuclear stress test    Nuclear stress test 10/19: EF 35, inferolateral, apical scar; inferior, apical inferior infarct with mild peri-infarct ischemia; high risk  . Obesity   . Personal history of chemotherapy   . Personal history of radiation therapy   . Pituitary tumor   . Seasonal asthma   . Sleep apnea    does not use every night  . Stroke [facility])    TIA - Dec 1 st, 2017    Current Outpatient Medications on File Prior to Visit  Medication Sig Dispense Refill  . Alirocumab (PRALUENT) 75 MG/ML SOAJ Inject 1 pen into the skin every 14 (fourteen) days. 2 pen 11  . amLODipine (NORVASC) 10 MG tablet Take 1 tablet (10 mg total) by mouth daily. 90 tablet 3  . aspirin EC 81 MG tablet Take 1 tablet (81 mg total) by mouth daily. 90 tablet 3  . carvedilol (COREG) 12.5 MG tablet Take 1.5 tablets (18.75 mg total) by mouth 2 (two) times daily. 270 tablet 3  . Cholecalciferol (VITAMIN D-3 PO) Take 1 tablet by mouth daily.     . Continuous Blood Gluc Receiver (FREESTYLE LIBRE 2 READER) DEVI Use reader to monitor blood sugars continuously. 1 each 0  . Continuous Blood Gluc Sensor (FREESTYLE LIBRE 2 SENSOR) MISC Use one sensor once every 14 days to monitor blood sugars continuously. 6 each 1  . HUMALOG 100 UNIT/ML cartridge INJECT 50 UNITS INTO THE SKIN EVERY MORNING AND EVERY NIGHT AT BEDTIME 30 mL 1  . hydrochlorothiazide (HYDRODIURIL) 25 MG tablet Take 1 tablet (25 mg total) by mouth daily. 30 tablet 3  . insulin glargine (LANTUS SOLOSTAR) 100 UNIT/ML Solostar Pen Inject 50 Units into the skin 2 (two) times daily. 30 mL 1  . irbesartan (AVAPRO) 300 MG tablet Take 300 mg by mouth daily.    . MULTIPLE VITAMIN PO Take 1 tablet daily by mouth.    . nitroGLYCERIN (NITROSTAT) 0.4 MG SL tablet Place 1  tablet (0.4 mg total) under the tongue every 5 (five) minutes as needed for chest pain. 25 tablet 0  . Semaglutide (RYBELSUS) 14 MG TABS Take 1 tablet by mouth daily before breakfast. 30 tablet 2  . Venlafaxine HCl 150 MG TB24 Take 150 mg by mouth daily.      No current facility-administered medications on file prior to visit.    Allergies  Allergen Reactions  . Hydrocodone Other (See Comments)    Severe anxiety Confusion   . Invokana [Canagliflozin] Other (See Comments)    Constant yeast infections  . Evolocumab  Injection site pain  . Hydralazine     Fatigue  . Imdur [Isosorbide Nitrate] Other (See Comments)    Patient reported wheezing  . Rosuvastatin     myalgias  . Simvastatin     myalgias  . Spironolactone     Wheezing vs fatigue?  Marland Kitchen Atorvastatin Other (See Comments)    DIZZINESS    There were no vitals taken for this visit.   Assessment/Plan:  1. Hypertension -  The patient's blood pressure appears to be at goal today (<130/80). She has noted worsening of symptoms consistent with orthostatic hypotension. Beyond this, she seems to be tolerating the amlodipine increase (without LEE or increased edema) Per conversation with the patient, these symptoms are not significant enough to warrant anti-hypertensive dose reduction. As the patient currently takes all of her medications in the morning, will suggest medication separation at this time. The patient was instructed to continue taking carvedilol 18.75mg  BID w/ amlodipine 10mg  and HCTZ 25mg  in the morning, but move her irbesartan 300mg  dosing to nighttime. Given the patient's history of CHF, and intolerance to the following GDMT medications (hydralazine, Invokana, Entresto, spironolactone, and Imdur) further HF optimization options for this patient include achieving GDMT dosing of carvedilol 25mg  BID. Will defer titration to future visits d/t patient's current symptomatic orthostasis.   Of note, the patient's recent  venlafaxine increase may exacerbate her underlying HTN. Will continue to monitor BP and discuss alternative medications with PCP if concerns arise.   Instructed patient to bring in BP & HR readings at next visit. Also requested patient check BP when feeling symptoms of hypotension. F/U with patient in 1 month.   Thank you,  Pt seen with Clinton Quant, Pharmacy Student Class of 2022. Note reviewed by Fuller Canada, PharmD.  [name], PharmD PGY2 [facility] [address], [place], [place] [address] Phone: [phone]; Fax: [phone]

## 2020-02-10 ENCOUNTER — Ambulatory Visit (INDEPENDENT_AMBULATORY_CARE_PROVIDER_SITE_OTHER): Payer: 59 | Admitting: Pharmacist

## 2020-02-10 ENCOUNTER — Other Ambulatory Visit: Payer: Self-pay

## 2020-02-10 VITALS — BP 130/78 | HR 90

## 2020-02-10 DIAGNOSIS — I10 Essential (primary) hypertension: Secondary | ICD-10-CM

## 2020-02-10 NOTE — Patient Instructions (Addendum)
Thank you for coming to see [place] today!  Your blood pressure readings are looking fantastic. Keep working on increasing your exercise and decreasing your salt intake.   To eliminate some of the dizziness that you are experiencing when you first stand up, we are going to move you Irbesartan to be taken at night.   Continue taking the following in the morning: - amlodipine 10mg  daily - carvedilol 18.75 mg  - HCTZ 25mg  daily   At night, take the following: - irbesartan 300 mg  - carvedilol 18.75 mg   Continue to take your blood pressure and heart rate. At your next visit, please bring your home log.   If you need [place], please call us at [phone].   Thank you! Abby

## 2020-02-18 ENCOUNTER — Other Ambulatory Visit: Payer: Self-pay | Admitting: *Deleted

## 2020-02-18 MED ORDER — HUMALOG 100 UNIT/ML ~~LOC~~ SOCT: SUBCUTANEOUS | 1 refills | Status: DC

## 2020-02-18 MED ORDER — AMLODIPINE BESYLATE 10 MG PO TABS: 10.0000 mg | ORAL_TABLET | Freq: Every day | ORAL | 3 refills | Status: DC

## 2020-03-04 NOTE — Progress Notes (Incomplete)
Patient ID: [name]                 DOB: 08/13/1955                      MRN: [id number]      HPI: Destiny Beasley is a 64 y.o. female patient of Dr. Burt Knack referred by Destiny Dopp, PA to HTN clinic. PMH is significant forCAD s/p STEMI in 7939, systolic CHF w/ last EF 0/30 recovered to 40-45%, T2DM, prior TIA, HTN and HLD. Pt has previously followed with PharmD team for cholesterol management for which she currently takes Praluent injections.   At last visit with pharmacy clinic on 02/10/20, patient reported anxiety attacks recently including tachycardia and flushing. She had been hospitalized frequently in the past as a result of these attacks. Reported anxiety attacks were improving. Attacks relieved by laying down on the ground and alprazolam 0.25mg  PRN which makes her feel "groggy". Her PCP also suggested increasing her venlafaxine to 225mg  daily (from 150mg  daily). States systolic BP typically increases to >170 SBP during an attack.   The patient reported tolerating recent amlodipine increase to 10mg  on 01/20/20. She denied swelling, headaches, or blurred vision. She endorsed worsening dizziness upon standing. This dizziness has not resulted in falls. At visit, patient was instructed to move irbesartan administration to bedtime to avoid orthostatic hypotension.   Carvedilol 25 mg BID (eval hypotension and fatigue, may need to do 18.75 QAM and 25 mg QPM). Last echo 40-45% and normal RV function  2nd op to switch to chlorthalidone  3rd op to contact PCP about venlafaxine  Current HTN meds: amlodipine 10 mg daily, carvedilol 18.75 mg BID, HCTZ 25 mg daily, irbesartan 300 mg daily Previously tried: spironolactone 25 mg daily (wheezing vs. fatigue), hydralazine 25, 50 mg TID (fatigue), Imdur 30 mg daily (wheezing), Entresto (unknown), Invokana (frequent yeast infections) BP goal: <130/80 mmHg  Family History:The patient'sfamily history includes diabetes in her mother; HLD in her  mother; HTN in her mother.  Social History:never smoked, + ETOH  Diet: recent efforts to decrease sugar intake and processed food - increased intake fresh fruit and veggies, no canned foods, and fast food 1-2x/wk  Exercise: has not been exercising  - will start walking   Home BP readings: did not have readings in clinic - will send over  Wt Readings from Last 3 Encounters:  12/10/19 260 lb (117.9 kg)  11/16/19 (!) 260 lb (117.9 kg)  10/29/19 259 lb (117.5 kg)   BP Readings from Last 3 Encounters:  02/10/20 130/78  11/16/19 (!) 161/116  10/29/19 130/80   Pulse Readings from Last 3 Encounters:  02/10/20 90  11/16/19 83  10/29/19 89    Renal function: CrCl cannot be calculated (Patient's most recent lab result is older than the maximum 21 days allowed.).  Past Medical History:  Diagnosis Date  . Anemia   . Anxiety   . CAD in native artery    a. Inf STEMI 0/9233 complicated by cardiogenic shock/complete heart block s/p emergent cath showing culprit large dominant LCx s/p aspiration thrombectomy and DES, EF 50% by cath, 40-45% by echo.  . Chronic systolic heart failure ([place]) 06/25/2017   Echo 11/19: mild LVH, EF 40-45, inf-lat HK, Gr 1 DD, trivial MR  . Complete heart block, transient ([place])    a. 06/2017 in setting of acute inf MI -> resolved after PCI.  Marland Kitchen COVID-19   . Diabetes mellitus   .  Goiter   . History of radiation therapy 06/14/2016 - 07/19/2016   Right Breast 50 Gy 25 fractions  . Hypercholesteremia   . Hypertension   . Ischemic cardiomyopathy    a. EF 50% by cath and 40-45% by echo 06/2017.  . Leg pain    ABIs 07/2019: Normal (R 1.27; L 1.25)  . Malignant neoplasm of upper-outer quadrant of right female breast ([place]) 02/24/2016  . Meningioma ([place])   . Nuclear stress test    Nuclear stress test 10/19: EF 35, inferolateral, apical scar; inferior, apical inferior infarct with mild peri-infarct ischemia; high risk  . Obesity   . Personal history of chemotherapy    . Personal history of radiation therapy   . Pituitary tumor   . Seasonal asthma   . Sleep apnea    does not use every night  . Stroke [facility])    TIA - Dec 1 st, 2017    Current Outpatient Medications on File Prior to Visit  Medication Sig Dispense Refill  . Alirocumab (PRALUENT) 75 MG/ML SOAJ Inject 1 pen into the skin every 14 (fourteen) days. 2 pen 11  . ALPRAZolam (XANAX) 0.25 MG tablet Take 0.25 mg by mouth 2 (two) times daily as needed for anxiety (anxiety attack).    Marland Kitchen amLODipine (NORVASC) 10 MG tablet Take 1 tablet (10 mg total) by mouth daily. 90 tablet 3  . aspirin EC 81 MG tablet Take 1 tablet (81 mg total) by mouth daily. 90 tablet 3  . carvedilol (COREG) 12.5 MG tablet Take 1.5 tablets (18.75 mg total) by mouth 2 (two) times daily. 270 tablet 3  . Cholecalciferol (VITAMIN D-3 PO) Take 1 tablet by mouth daily.     . Continuous Blood Gluc Receiver (FREESTYLE LIBRE 2 READER) DEVI Use reader to monitor blood sugars continuously. 1 each 0  . Continuous Blood Gluc Sensor (FREESTYLE LIBRE 2 SENSOR) MISC Use one sensor once every 14 days to monitor blood sugars continuously. 6 each 1  . hydrochlorothiazide (HYDRODIURIL) 25 MG tablet Take 1 tablet (25 mg total) by mouth daily. 30 tablet 3  . insulin glargine (LANTUS SOLOSTAR) 100 UNIT/ML Solostar Pen Inject 50 Units into the skin 2 (two) times daily. 30 mL 1  . insulin lispro (HUMALOG) 100 UNIT/ML cartridge INJECT 50 UNITS INTO THE SKIN EVERY MORNING AND EVERY NIGHT AT BEDTIME 30 mL 1  . irbesartan (AVAPRO) 300 MG tablet Take 300 mg by mouth daily.    . MULTIPLE VITAMIN PO Take 1 tablet daily by mouth.    . nitroGLYCERIN (NITROSTAT) 0.4 MG SL tablet Place 1 tablet (0.4 mg total) under the tongue every 5 (five) minutes as needed for chest pain. 25 tablet 0  . Semaglutide (RYBELSUS) 14 MG TABS Take 1 tablet by mouth daily before breakfast. 30 tablet 2  . Venlafaxine HCl 150 MG TB24 Take 150 mg by mouth daily.      No current  facility-administered medications on file prior to visit.    Allergies  Allergen Reactions  . Hydrocodone Other (See Comments)    Severe anxiety Confusion   . Invokana [Canagliflozin] Other (See Comments)    Constant yeast infections  . Evolocumab     Injection site pain  . Hydralazine     Fatigue  . Imdur [Isosorbide Nitrate] Other (See Comments)    Patient reported wheezing  . Rosuvastatin     myalgias  . Simvastatin     myalgias  . Spironolactone     Wheezing vs fatigue?  Marland Kitchen  Atorvastatin Other (See Comments)    DIZZINESS    There were no vitals taken for this visit.   Assessment/Plan:  1. Hypertension -  The patient's blood pressure appears to be at goal today (<130/80). Given the patient's history of CHF, and intolerance to the following GDMT medications (hydralazine, canagliflozin, Entresto, spironolactone, and isosorbide mononitrate)  Of note, the patient's recent venlafaxine increase may exacerbate her underlying HTN. Will continue to monitor BP and discuss alternative medications with PCP if concerns arise.    Thank you,

## 2020-03-05 ENCOUNTER — Ambulatory Visit: Payer: 59

## 2020-03-08 ENCOUNTER — Other Ambulatory Visit: Payer: Self-pay | Admitting: Endocrinology

## 2020-03-09 ENCOUNTER — Ambulatory Visit: Payer: 59

## 2020-03-16 ENCOUNTER — Other Ambulatory Visit: Payer: Self-pay | Admitting: *Deleted

## 2020-03-16 MED ORDER — RYBELSUS 14 MG PO TABS: 1.0000 | ORAL_TABLET | Freq: Every day | ORAL | 2 refills | Status: DC

## 2020-03-23 ENCOUNTER — Other Ambulatory Visit: Payer: Self-pay | Admitting: *Deleted

## 2020-03-23 MED ORDER — FREESTYLE LIBRE 2 READER DEVI: 0 refills | Status: DC

## 2020-03-24 ENCOUNTER — Other Ambulatory Visit: Payer: Self-pay | Admitting: Physician Assistant

## 2020-04-21 ENCOUNTER — Other Ambulatory Visit: Payer: Self-pay | Admitting: Endocrinology

## 2020-05-03 ENCOUNTER — Other Ambulatory Visit: Payer: Self-pay | Admitting: Endocrinology

## 2020-05-05 ENCOUNTER — Other Ambulatory Visit: Payer: Self-pay | Admitting: Endocrinology

## 2020-05-12 ENCOUNTER — Encounter (HOSPITAL_COMMUNITY): Payer: Self-pay

## 2020-05-12 ENCOUNTER — Other Ambulatory Visit: Payer: Self-pay

## 2020-05-12 ENCOUNTER — Emergency Department (HOSPITAL_COMMUNITY): Payer: 59

## 2020-05-12 ENCOUNTER — Observation Stay (HOSPITAL_COMMUNITY)
Admission: EM | Admit: 2020-05-12 | Discharge: 2020-05-13 | Disposition: A | Discharge status: Home / Self Care | Payer: 59 | Attending: Cardiovascular Disease | Admitting: Cardiovascular Disease

## 2020-05-12 DIAGNOSIS — Z853 Personal history of malignant neoplasm of breast: Secondary | ICD-10-CM | POA: Insufficient documentation

## 2020-05-12 DIAGNOSIS — Z79899 Other long term (current) drug therapy: Secondary | ICD-10-CM | POA: Diagnosis not present

## 2020-05-12 DIAGNOSIS — Z20822 Contact with and (suspected) exposure to covid-19: Secondary | ICD-10-CM | POA: Insufficient documentation

## 2020-05-12 DIAGNOSIS — I110 Hypertensive heart disease with heart failure: Secondary | ICD-10-CM | POA: Diagnosis not present

## 2020-05-12 DIAGNOSIS — E119 Type 2 diabetes mellitus without complications: Secondary | ICD-10-CM | POA: Diagnosis not present

## 2020-05-12 DIAGNOSIS — Z794 Long term (current) use of insulin: Secondary | ICD-10-CM | POA: Diagnosis not present

## 2020-05-12 DIAGNOSIS — I200 Unstable angina: Secondary | ICD-10-CM | POA: Diagnosis present

## 2020-05-12 DIAGNOSIS — I25110 Atherosclerotic heart disease of native coronary artery with unstable angina pectoris: Principal | ICD-10-CM | POA: Insufficient documentation

## 2020-05-12 DIAGNOSIS — R079 Chest pain, unspecified: Secondary | ICD-10-CM

## 2020-05-12 DIAGNOSIS — I5022 Chronic systolic (congestive) heart failure: Secondary | ICD-10-CM | POA: Diagnosis not present

## 2020-05-12 DIAGNOSIS — Z7982 Long term (current) use of aspirin: Secondary | ICD-10-CM | POA: Diagnosis not present

## 2020-05-12 DIAGNOSIS — J45909 Unspecified asthma, uncomplicated: Secondary | ICD-10-CM | POA: Diagnosis not present

## 2020-05-12 DIAGNOSIS — Z8673 Personal history of transient ischemic attack (TIA), and cerebral infarction without residual deficits: Secondary | ICD-10-CM | POA: Diagnosis not present

## 2020-05-12 DIAGNOSIS — I10 Essential (primary) hypertension: Secondary | ICD-10-CM

## 2020-05-12 DIAGNOSIS — Z8616 Personal history of COVID-19: Secondary | ICD-10-CM | POA: Diagnosis not present

## 2020-05-12 DIAGNOSIS — E118 Type 2 diabetes mellitus with unspecified complications: Secondary | ICD-10-CM | POA: Diagnosis not present

## 2020-05-12 DIAGNOSIS — E785 Hyperlipidemia, unspecified: Secondary | ICD-10-CM | POA: Diagnosis not present

## 2020-05-12 LAB — BASIC METABOLIC PANEL
Anion gap: 9 (ref 5–15)
BUN: 15 mg/dL (ref 8–23)
CO2: 28 mmol/L (ref 22–32)
Calcium: 9.7 mg/dL (ref 8.9–10.3)
Chloride: 101 mmol/L (ref 98–111)
Creatinine, Ser: 0.91 mg/dL (ref 0.44–1.00)
GFR, Estimated: >60 mL/min (ref >60)
Glucose, Bld: 196 mg/dL — ABNORMAL HIGH (ref 70–99)
Potassium: 4.4 mmol/L (ref 3.5–5.1)
Sodium: 138 mmol/L (ref 135–145)

## 2020-05-12 LAB — TROPONIN I (HIGH SENSITIVITY)
Troponin I (High Sensitivity): 6 ng/L (ref <18)
Troponin I (High Sensitivity): 7 ng/L (ref <18)

## 2020-05-12 LAB — CBG MONITORING, ED: Glucose-Capillary: 118 mg/dL — ABNORMAL HIGH (ref 70–99)

## 2020-05-12 LAB — PROTIME-INR
INR: 1 (ref 0.8–1.2)
Prothrombin Time: 13.2 seconds (ref 11.4–15.2)

## 2020-05-12 LAB — TSH: TSH: 1.563 u[IU]/mL (ref 0.350–4.500)

## 2020-05-12 LAB — CBC
HCT: 42.7 % (ref 36.0–46.0)
Hemoglobin: 13.3 g/dL (ref 12.0–15.0)
MCH: 28.9 pg (ref 26.0–34.0)
MCHC: 31.1 g/dL (ref 30.0–36.0)
MCV: 92.6 fL (ref 80.0–100.0)
Platelets: 361 10*3/uL (ref 150–400)
RBC: 4.61 MIL/uL (ref 3.87–5.11)
RDW: 13.1 % (ref 11.5–15.5)
WBC: 6.9 10*3/uL (ref 4.0–10.5)
nRBC: 0 % (ref 0.0–0.2)

## 2020-05-12 LAB — HEMOGLOBIN A1C
Hgb A1c MFr Bld: 8.3 % — ABNORMAL HIGH (ref 4.8–5.6)
Mean Plasma Glucose: 191.51 mg/dL

## 2020-05-12 LAB — HIV ANTIBODY (ROUTINE TESTING W REFLEX): HIV Screen 4th Generation wRfx: NONREACTIVE

## 2020-05-12 LAB — GLUCOSE, CAPILLARY: Glucose-Capillary: 169 mg/dL — ABNORMAL HIGH (ref 70–99)

## 2020-05-12 MED ORDER — HEPARIN BOLUS VIA INFUSION
4000.0000 [IU] | Freq: Once | INTRAVENOUS | Status: AC
  Administered 2020-05-12: 4000 [IU] via INTRAVENOUS
  Filled 2020-05-12: qty 4000

## 2020-05-12 MED ORDER — HEPARIN (PORCINE) 25000 UT/250ML-% IV SOLN
1350.0000 [IU]/h | INTRAVENOUS | Status: DC
  Administered 2020-05-12: 1350 [IU]/h via INTRAVENOUS
  Filled 2020-05-12 (×2): qty 250

## 2020-05-12 MED ORDER — PANTOPRAZOLE SODIUM 40 MG PO TBEC
40.0000 mg | DELAYED_RELEASE_TABLET | Freq: Every day | ORAL | Status: DC
  Administered 2020-05-13: 40 mg via ORAL
  Filled 2020-05-12: qty 1

## 2020-05-12 MED ORDER — SODIUM CHLORIDE 0.9% FLUSH
3.0000 mL | Freq: Two times a day (BID) | INTRAVENOUS | Status: DC
  Administered 2020-05-13: 3 mL via INTRAVENOUS

## 2020-05-12 MED ORDER — ACETAMINOPHEN 325 MG PO TABS: 650.0000 mg | ORAL_TABLET | ORAL | Status: DC | PRN

## 2020-05-12 MED ORDER — SODIUM CHLORIDE 0.9% FLUSH
3.0000 mL | INTRAVENOUS | Status: DC | PRN
  Administered 2020-05-13: 3 mL via INTRAVENOUS

## 2020-05-12 MED ORDER — IRBESARTAN 150 MG PO TABS
300.0000 mg | ORAL_TABLET | Freq: Every day | ORAL | Status: DC
  Administered 2020-05-13: 300 mg via ORAL
  Filled 2020-05-12: qty 2

## 2020-05-12 MED ORDER — ASPIRIN EC 81 MG PO TBEC: 81.0000 mg | DELAYED_RELEASE_TABLET | Freq: Every day | ORAL | Status: DC

## 2020-05-12 MED ORDER — SODIUM CHLORIDE 0.9 % IV SOLN: 250.0000 mL | INTRAVENOUS | Status: DC | PRN

## 2020-05-12 MED ORDER — CARVEDILOL 6.25 MG PO TABS
18.7500 mg | ORAL_TABLET | Freq: Two times a day (BID) | ORAL | Status: DC
  Administered 2020-05-12 – 2020-05-13 (×2): 18.75 mg via ORAL
  Filled 2020-05-12 (×2): qty 1

## 2020-05-12 MED ORDER — NITROGLYCERIN 0.4 MG SL SUBL: 0.4000 mg | SUBLINGUAL_TABLET | SUBLINGUAL | Status: DC | PRN

## 2020-05-12 MED ORDER — ASPIRIN 81 MG PO CHEW
81.0000 mg | CHEWABLE_TABLET | ORAL | Status: AC
Start: 2020-05-13 — End: 2020-05-13
  Administered 2020-05-13: 81 mg via ORAL
  Filled 2020-05-12: qty 1

## 2020-05-12 MED ORDER — ALUM & MAG HYDROXIDE-SIMETH 200-200-20 MG/5ML PO SUSP
30.0000 mL | Freq: Once | ORAL | Status: AC
  Administered 2020-05-12: 30 mL via ORAL
  Filled 2020-05-12: qty 30

## 2020-05-12 MED ORDER — SODIUM CHLORIDE 0.9 % WEIGHT BASED INFUSION
3.0000 mL/kg/h | INTRAVENOUS | Status: DC
  Administered 2020-05-13: 3 mL/kg/h via INTRAVENOUS

## 2020-05-12 MED ORDER — AMLODIPINE BESYLATE 10 MG PO TABS
10.0000 mg | ORAL_TABLET | Freq: Every day | ORAL | Status: DC
  Administered 2020-05-13: 10 mg via ORAL
  Filled 2020-05-12: qty 1

## 2020-05-12 MED ORDER — MORPHINE SULFATE (PF) 2 MG/ML IV SOLN
2.0000 mg | Freq: Once | INTRAVENOUS | Status: DC
Start: 2020-05-12 — End: 2020-05-13
  Filled 2020-05-12: qty 1

## 2020-05-12 MED ORDER — ONDANSETRON HCL 4 MG/2ML IJ SOLN: 4.0000 mg | Freq: Four times a day (QID) | INTRAMUSCULAR | Status: DC | PRN

## 2020-05-12 MED ORDER — VENLAFAXINE HCL ER 75 MG PO CP24
225.0000 mg | ORAL_CAPSULE | Freq: Every day | ORAL | Status: DC
  Administered 2020-05-13: 225 mg via ORAL
  Filled 2020-05-12: qty 3

## 2020-05-12 MED ORDER — SODIUM CHLORIDE 0.9 % WEIGHT BASED INFUSION: 1.0000 mL/kg/h | INTRAVENOUS | Status: DC

## 2020-05-12 MED ORDER — INSULIN ASPART 100 UNIT/ML ~~LOC~~ SOLN
0.0000 [IU] | Freq: Three times a day (TID) | SUBCUTANEOUS | Status: DC
  Administered 2020-05-13: 11 [IU] via SUBCUTANEOUS

## 2020-05-12 MED ORDER — MOMETASONE FURO-FORMOTEROL FUM 200-5 MCG/ACT IN AERO
2.0000 | INHALATION_SPRAY | Freq: Two times a day (BID) | RESPIRATORY_TRACT | Status: DC
  Administered 2020-05-12: 2 via RESPIRATORY_TRACT
  Filled 2020-05-12: qty 8.8

## 2020-05-12 MED ORDER — ALPRAZOLAM 0.25 MG PO TABS
0.2500 mg | ORAL_TABLET | Freq: Two times a day (BID) | ORAL | Status: DC | PRN
  Administered 2020-05-13: 0.25 mg via ORAL
  Filled 2020-05-12: qty 1

## 2020-05-12 MED ORDER — MORPHINE SULFATE (PF) 2 MG/ML IV SOLN
2.0000 mg | Freq: Once | INTRAVENOUS | Status: AC
  Administered 2020-05-12: 2 mg via INTRAVENOUS
  Filled 2020-05-12: qty 1

## 2020-05-12 MED ORDER — NITROGLYCERIN IN D5W 200-5 MCG/ML-% IV SOLN
0.0000 ug/min | INTRAVENOUS | Status: DC
  Administered 2020-05-12: 5 ug/min via INTRAVENOUS
  Filled 2020-05-12: qty 250

## 2020-05-12 NOTE — Telephone Encounter (Signed)
I spoke with patient.  She reports chest tightness and shortness of breath which started about an hour ago.  She used her inhaler which usually helps but this time it has not helped.  Reports she has asthma.  She states she did not have chest pain when she had heart attack.  She did pass out with heart attack. Patient reports she was fine until this morning.  She has NTG but has not used.  I went over NTG use with patient.  I told her to call EMS if pain was not relieved after third dose of NTG.

## 2020-05-12 NOTE — H&P (Addendum)
Cardiology Admission History and Physical:   Patient ID: [name] MRN: [id number]; DOB: 09/21/1955   Admission date: 05/12/2020  Primary Care Provider: Lawerance Cruel, MD [facility] Cardiologist: Sherren Mocha, MD   Chief Complaint:  Chest pressure  Patient Profile:   Destiny Beasley is a 65 y.o. female with coronary artery disease s/p DES to proximal circumflex, chronic systolic heart failure/ischemic cardiomyopathy, hypertension, hyperlipidemia with statin intolerance, diabetes mellitus, obstructive sleep apnea on BiPAP, TIA, breast cancer and history of COVID-11 July 2019 presented for chest pressure.  History of inferior lateral STEMI in March [id number] complicated by cardiogenic shock, acute heart failure and complete heart block.  Underwent PCI with DES to circumflex.  EF was 40 to 45% at that time.  Unable to tolerate Entresto in the past.  Also had difficulty with spironolactone.  Last Myoview October 2019 with inferior scar and mild peri-infarct ischemia. Last echocardiogram May 2020 with EF of 40 to 45%.  She was doing well on cardiac standpoint when last seen by Richardson Dopp, PA July 2021.  History of Present Illness:   Destiny Beasley was in usual state of health up until this morning when she had sudden onset substernal chest pressure around 8 AM.  It was 8 out of 10 in intensity.  Associated with shortness of breath and radiating to her upper back and left jaw/shoulder.  She took sublingual nitroglycerin x3 without improvement and EMS was called.  She was given aspirin 324 mg and an additional sublingual nitroglycerin without significant improvement on her pain.  Currently reporting 8 out of 10 pressure.  Symptoms similar to prior MI.  No regular exercise.  Denies dizziness, palpitation, orthopnea, PND, syncope, lower extremity edema or melena.  Compliant with BiPAP.  Patient denies recent travel or surgery.  She reports vaccinated for COVID-19 and also has received  booster.  High-sensitivity troponin negative x2.  Electrolytes and renal function are normal.  Chest x-ray without acute abnormality. BP 160-170/90s.   Past Medical History:  Diagnosis Date  . Anemia   . Anxiety   . CAD in native artery    a. Inf STEMI [id number] complicated by cardiogenic shock/complete heart block s/p emergent cath showing culprit large dominant LCx s/p aspiration thrombectomy and DES, EF 50% by cath, 40-45% by echo.  . Chronic systolic heart failure ([place]) 06/25/2017   Echo 11/19: mild LVH, EF 40-45, inf-lat HK, Gr 1 DD, trivial MR  . Complete heart block, transient ([place])    a. 06/2017 in setting of acute inf MI -> resolved after PCI.  Marland Kitchen COVID-19   . Diabetes mellitus   . Goiter   . History of radiation therapy 06/14/2016 - 07/19/2016   Right Breast 50 Gy 25 fractions  . Hypercholesteremia   . Hypertension   . Ischemic cardiomyopathy    a. EF 50% by cath and 40-45% by echo 06/2017.  . Leg pain    ABIs 07/2019: Normal (R 1.27; L 1.25)  . Malignant neoplasm of upper-outer quadrant of right female breast ([place]) 02/24/2016  . Meningioma ([place])   . Nuclear stress test    Nuclear stress test 10/19: EF 35, inferolateral, apical scar; inferior, apical inferior infarct with mild peri-infarct ischemia; high risk  . Obesity   . Personal history of chemotherapy   . Personal history of radiation therapy   . Pituitary tumor   . Seasonal asthma   . Sleep apnea    does not use every night  . Stroke [facility])  TIA - Dec 1 st, 2017    Past Surgical History:  Procedure Laterality Date  . ABDOMINAL HYSTERECTOMY    . BRAIN MENINGIOMA EXCISION  2005  . BREAST BIOPSY    . BREAST LUMPECTOMY Right    2017  . BREAST LUMPECTOMY WITH RADIOACTIVE SEED LOCALIZATION Right 04/20/2016   Procedure: RIGHT BREAST LUMPECTOMY WITH RADIOACTIVE SEED LOCALIZATION;  Surgeon: Claud Kelp, MD;  Location: [facility];  Service: General;  Laterality: Right;  . BREAST REDUCTION SURGERY  Bilateral 10/23/2016   Procedure: BILATERAL BREAST REDUCTION WITH LIPOSUCTION ASSISTANCE;  Surgeon: Louisa Second, MD;  Location: [facility];  Service: Plastics;  Laterality: Bilateral;  . CESAREAN SECTION    . CORONARY STENT INTERVENTION N/A 06/25/2017   Procedure: CORONARY STENT INTERVENTION;  Surgeon: Tonny Bollman, MD;  Location: [facility] INVASIVE CV LAB;  Service: Cardiovascular;  Laterality: N/A;  . CORONARY/GRAFT ACUTE MI REVASCULARIZATION N/A 06/25/2017   Procedure: Coronary/Graft Acute MI Revascularization;  Surgeon: Tonny Bollman, MD;  Location: [facility] INVASIVE CV LAB;  Service: Cardiovascular;  Laterality: N/A;  . FOOT SURGERY Bilateral 2016   hammer toe surgery  . LEFT HEART CATH AND CORONARY ANGIOGRAPHY N/A 06/25/2017   Procedure: LEFT HEART CATH AND CORONARY ANGIOGRAPHY;  Surgeon: Tonny Bollman, MD;  Location: [facility] INVASIVE CV LAB;  Service: Cardiovascular;  Laterality: N/A;  . MENISCUS REPAIR Left 2015  . PITUITARY SURGERY    . REDUCTION MAMMAPLASTY    . RIGHT HEART CATH N/A 06/25/2017   Procedure: RIGHT HEART CATH;  Surgeon: Tonny Bollman, MD;  Location: [facility] INVASIVE CV LAB;  Service: Cardiovascular;  Laterality: N/A;  . THYROID SURGERY       Medications Prior to Admission: Prior to Admission medications   Medication Sig Start Date End Date Taking? Authorizing Provider  ADVAIR HFA 115-21 MCG/ACT inhaler Inhale 2 puffs into the lungs in the morning and at bedtime. 04/12/20  Yes [provider]  Alirocumab (PRALUENT) 75 MG/ML SOAJ Inject 1 pen into the skin every 14 (fourteen) days. 09/29/19  Yes Tonny Bollman, MD  ALPRAZolam Prudy Feeler) 0.25 MG tablet Take 0.25 mg by mouth 2 (two) times daily as needed (anxiety attack).   Yes [provider]  amLODipine (NORVASC) 10 MG tablet Take 1 tablet (10 mg total) by mouth daily. 02/18/20  Yes Tereso Newcomer T, PA-C  aspirin EC 81 MG tablet Take 1 tablet (81 mg total) by mouth daily. 01/01/19  Yes Weaver, Scott T, PA-C   carvedilol (COREG) 12.5 MG tablet Take 1.5 tablets (18.75 mg total) by mouth 2 (two) times daily. 07/11/19  Yes Weaver, Scott T, PA-C  Cholecalciferol (VITAMIN D-3 PO) Take 1 tablet by mouth daily.    Yes [provider]  hydrochlorothiazide (HYDRODIURIL) 25 MG tablet TAKE 1 TABLET(25 MG) BY MOUTH DAILY Patient taking differently: Take 25 mg by mouth daily. 03/24/20  Yes Weaver, Scott T, PA-C  insulin lispro (HUMALOG) 100 UNIT/ML cartridge ADMINISTER 50 UNITS UNDER THE SKIN EVERY MORNING AND EVERY NIGHT AT BEDTIME Patient taking differently: Inject 3-25 Units into the skin 3 (three) times daily with meals. Per sliding scale. 04/21/20  Yes Reather Littler, MD  irbesartan (AVAPRO) 300 MG tablet Take 300 mg by mouth daily.   Yes [provider]  LANTUS SOLOSTAR 100 UNIT/ML Solostar Pen ADMINISTER 50 UNITS UNDER THE SKIN TWICE DAILY Patient taking differently: Inject 50 Units into the skin daily. 05/05/20  Yes Reather Littler, MD  MULTIPLE VITAMIN PO Take 1 tablet daily by mouth.  Yes [provider]  nitroGLYCERIN (NITROSTAT) 0.4 MG SL tablet Place 1 tablet (0.4 mg total) under the tongue every 5 (five) minutes as needed for chest pain. 06/24/19 09/22/19 Yes Weaver, Scott T, PA-C  pantoprazole (PROTONIX) 40 MG tablet Take 40 mg by mouth daily. 05/10/20  Yes [provider]  valACYclovir (VALTREX) 1000 MG tablet Take 1,000 mg by mouth 2 (two) times daily. 05/02/20  Yes [provider]  Venlafaxine HCl 225 MG TB24 Take 225 mg by mouth daily.   Yes [provider]  Continuous Blood Gluc Receiver (FREESTYLE LIBRE 2 READER) DEVI Use reader to monitor blood sugars continuously. 03/23/20   Elayne Snare, MD  Continuous Blood Gluc Sensor (FREESTYLE LIBRE 2 SENSOR) MISC USE 1 SENSOR ONCE EVERY 14  DAYS TO MONITOR BLOOD  SUGARS CONTINUOUSLY 05/03/20   Elayne Snare, MD  Semaglutide (RYBELSUS) 14 MG TABS Take 1 tablet by mouth daily before breakfast. Patient not taking: No sig  reported 03/16/20   Elayne Snare, MD  terbinafine (LAMISIL) 250 MG tablet Take 250 mg by mouth daily. 05/04/20   [provider]     Allergies:    Allergies  Allergen Reactions  . Hydrocodone Other (See Comments)    Severe anxiety Confusion   . Invokana [Canagliflozin] Other (See Comments)    Constant yeast infections  . Evolocumab     Injection site pain  . Hydralazine     Fatigue  . Imdur [Isosorbide Nitrate] Other (See Comments)    Patient reported wheezing  . Rosuvastatin     myalgias  . Simvastatin     myalgias  . Spironolactone     Wheezing vs fatigue?  Marland Kitchen Atorvastatin Other (See Comments)    DIZZINESS    Social History:   Social History   Socioeconomic History  . Marital status: Single    Spouse name: Not on file  . Number of children: Not on file  . Years of education: Not on file  . Highest education level: Not on file  Occupational History  . Not on file  Tobacco Use  . Smoking status: Never Smoker  . Smokeless tobacco: Never Used  Vaping Use  . Vaping Use: Never used  Substance and Sexual Activity  . Alcohol use: Yes    Alcohol/week: 1.0 standard drink    Types: 1 Glasses of wine per week    Comment: 1 glass monthly  . Drug use: No  . Sexual activity: Not on file    Comment: no cycles; pt had hyst  Other Topics Concern  . Not on file  Social History Narrative  . Not on file   Social Determinants of Health   Financial Resource Strain: Not on file  Food Insecurity: Not on file  Transportation Needs: Not on file  Physical Activity: Not on file  Stress: Not on file  Social Connections: Not on file  Intimate Partner Violence: Not on file    Family History:   The patient's family history includes Diabetes in her mother; Hyperlipidemia in her mother; Hypertension in her mother.    ROS:  Please see the history of present illness.  All other ROS reviewed and negative.     Physical Exam/Data:   Vitals:   05/12/20 1345 05/12/20 1400  05/12/20 1515 05/12/20 1600  BP: (!) 170/88 (!) 174/97 (!) 171/91 (!) 168/91  Pulse: 75 78 79 80  Resp: 14 14 10 16   Temp:      TempSrc:  SpO2: 100% 100% 100% 100%  Weight:      Height:       No intake or output data in the 24 hours ending 05/12/20 1633 Last 3 Weights 05/12/2020 12/10/2019 11/16/2019  Weight (lbs) 259 lb 260 lb 260 lb  Weight (kg) 117.482 kg 117.935 kg 117.935 kg     Body mass index is 35.13 kg/m.  General:  Well nourished, well developed, in no acute distress HEENT: normal Lymph: no adenopathy Neck: no JVD Endocrine:  No thryomegaly Vascular: No carotid bruits; FA pulses 2+ bilaterally without bruits  Cardiac:  normal S1, S2; RRR; no murmur Lungs:  clear to auscultation bilaterally, no wheezing, rhonchi or rales  Abd: soft, nontender, no hepatomegaly  Ext: no edema Musculoskeletal:  No deformities, BUE and BLE strength normal and equal Skin: warm and dry  Neuro:  CNs 2-12 intact, no focal abnormalities noted Psych:  Normal affect    EKG:  The ECG that was done today was personally reviewed and demonstrates sinus rhythm with chronic T wave inversion in inferior leads with Q waves  Relevant CV Studies:  Echo 08/2018 1. The left ventricle has mild-moderately reduced systolic function, with  an ejection fraction of 40-45%. The cavity size was normal. There is  mildly increased left ventricular wall thickness. Left ventricular  diastolic Doppler parameters are consistent  with impaired relaxation.  2. The right ventricle has normal systolic function. The cavity was  normal.  3. The mitral valve is grossly normal.  4. The tricuspid valve is grossly normal.  5. The aortic valve is tricuspid. No stenosis of the aortic valve.  6. Akinesis of the inferolateral wall with overall mild to moderate LV  dysfunction; mild diastolic dysfunction; mild LVH.   Stress test 01/2018  Nuclear stress EF: 35%.  The left ventricular ejection fraction is moderately  decreased (30-44%).  Blood pressure demonstrated a normal response to exercise.  There was no ST segment deviation noted during stress.  There is a medium defect of severe severity present in the basal inferolateral, mid inferolateral and apical lateral location. The defect is non-reversible and consistent with scar.  There is a medium defect of moderate severity present in the basal inferior, mid inferior and apical inferior location. The defect is partially reversible and consistent with infarct with mild peri infarct ischemia.  This is a high risk study.   Coronary/Graft Acute MI Revascularization   06/2017  CORONARY STENT INTERVENTION  LEFT HEART CATH AND CORONARY ANGIOGRAPHY  RIGHT HEART CATH   Conclusion    There is mild to moderate left ventricular systolic dysfunction.  LV end diastolic pressure is moderately elevated.  The left ventricular ejection fraction is 45-50% by visual estimate.  Prox LAD to Mid LAD lesion is 30% stenosed.  Prox Cx lesion is 100% stenosed.  A drug-eluting stent was successfully placed using a STENT SYNERGY DES 4X20.  Post intervention, there is a 0% residual stenosis.   1.  Acute inferolateral STEMI involving a large, dominant left circumflex complicated by cardiogenic shock and complete heart block 2.  Successful PCI of the left circumflex using aspiration thrombectomy and drug-eluting stent implantation 3.  Mild nonobstructive disease in the left main, LAD, and nondominant RCA without significant stenosis in any of those vessels 4.  Mild segmental contraction abnormality of the left ventricle consistent with a left circumflex/inferior infarct, with mild segmental LV systolic dysfunction, LVEF estimated at 50% 5.  Acute systolic heart failure with elevated LVEDP and mildly elevated right  heart pressures  Recommendations: The patient will be transferred to the cardiac ICU for continued post MI care.  We will continue Cangrelor for 2 hours.   She is loaded with Brilinta 180 mg at the completion of the procedure.  Post MI medical therapy will be instituted.  We will wean her off of dopamine as tolerated.  Diagnostic Dominance: Left    Intervention      Laboratory Data:  High Sensitivity Troponin:   Recent Labs  Lab 05/12/20 1135 05/12/20 1323  TROPONINIHS 6 7      Chemistry Recent Labs  Lab 05/12/20 1135  NA 138  K 4.4  CL 101  CO2 28  GLUCOSE 196*  BUN 15  CREATININE 0.91  CALCIUM 9.7  GFRNONAA >60  ANIONGAP 9    Hematology Recent Labs  Lab 05/12/20 1135  WBC 6.9  RBC 4.61  HGB 13.3  HCT 42.7  MCV 92.6  MCH 28.9  MCHC 31.1  RDW 13.1  PLT 361   Radiology/Studies:  DG Chest 2 View  Result Date: 05/12/2020 CLINICAL DATA:  Chest pain EXAM: CHEST - 2 VIEW COMPARISON:  November 16, 2019. FINDINGS: Lungs are clear. Heart size and pulmonary vascularity are normal. No adenopathy. No pneumothorax. No bone lesions. IMPRESSION: Lungs clear.  Cardiac silhouette normal. Electronically Signed   By: Lowella Grip III M.D.   On: 05/12/2020 12:25     Assessment and Plan:   1. Chest pressure concerning for unstable angina -Patient presented with acute onset substernal chest pressure with associated shortness of breath and radiation to her upper back and left neck/shoulder.  Reports symptoms similar to prior MI in 2019.  She took total sublingual nitroglycerin x4, IV morphine and Maalox without improvement on her symptoms.  Still reporting 8 out of 10 chest pressure. -Troponin negative x2.  EKG without acute ischemic changes. -Admit and cycle troponin.  Repeat EKG in a.m. -Start nitroglycerin drip and Heparin  - Plan cath in AM  2. CAD s/p DES to proximal circumflex in 06/2017 -Last Myoview October 2019 with inferior scar and mild peri-infarct ischemia. -Continue aspirin, Coreg and ARB  3. Chronic systolic heart failure - Last echocardiogram May 2020 with EF of 40 to 45%. -Patient appears euvolemic by  exam -Chest x-ray clear -Per note, intolerance to Entresto and spironolactone -Continue home carvedilol and irbesartan  4. Hyperlipidemia -Statin intolerance -On PCSK9 inhibitor at home  5. Hypertension -Blood pressure elevated -Nitroglycerin drip as above -Continue home Coreg, amlodipine, HCTZ and Avapro  6. DM - SSI while here   TIMI Risk Score for Unstable Angina or Non-ST Elevation MI:   The patient's TIMI risk score is 4, which indicates a 20% risk of all cause mortality, new or recurrent myocardial infarction or need for urgent revascularization in the next 14 days.  Severity of Illness: The appropriate patient status for this patient is OBSERVATION. Observation status is judged to be reasonable and necessary in order to provide the required intensity of service to ensure the patient's safety. The patient's presenting symptoms, physical exam findings, and initial radiographic and laboratory data in the context of their medical condition is felt to place them at decreased risk for further clinical deterioration. Furthermore, it is anticipated that the patient will be medically stable for discharge from the hospital within 2 midnights of admission. The following factors support the patient status of observation.   " The patient's presenting symptoms include Chest pain . " The physical exam findings include N/A " The  initial radiographic and laboratory data are N/A   For questions or updates, please contact [place] Please consult [email or web address] for contact info under     Jarrett Soho, [place]  05/12/2020 4:33 PM   Patient seen and examined. Agree with assessment and plan.  Destiny Beasley is a 65 year old African-American female who has known CAD and suffered an inferolateral ST segment elevation myocardial infarction in March 7062 complicated by cardiogenic shock, pleat heart block with acute heart failure.  She underwent successful PCI to a totally occluded  dominant left circumflex coronary artery.  EF was 40 to 45%.  She has been followed by Dr. Burt Knack.  She has had several medication intolerances including Entresto as well as spironolactone and statins.  A Myoview study in October 2019 showed inferior scar with mild peri-infarction ischemia.  Her last echo Doppler study in May 2020 showed an EF of 40 to 45%.  Today she developed recurrent substernal chest pressure of similar character to her prior MI.  She ultimately presented to the emergency room.  She was given nitroglycerin and aspirin some improvement.  High-sensitivity troponins are negative.  Her ECG is without acute abnormality but shows sinus rhythm with small inferior Q waves with T wave inversion inferiorly and early transition suggesting posterior wall involvement from her prior MI.  She has a history of diabetes mellitus, hypertension, hyperlipidemia, struct of sleep apnea on CPAP therapy and is now on Praluent for PCSK9 inhibition.  On presentation, her blood pressure was elevated at 170/88.  Most recent blood pressure was 168/91.  Pulse was regular in the 80s.  Respirations were 16.  HEENT was notable for thick neck.  There was no carotid bruits.  Lungs were clear.  She did not have chest wall tenderness.  Rhythm was regular without ectopy.'s were 2+.  There was no clubbing cyanosis or edema.  Homan signs were negative.  Neurologic exam was grossly nonfocal.  She had normal affect and mood.  Laboratories notable for normal high-sensitivity troponin.  Renal function is stable with a creatinine of 0.91.  CBC is normal.  With her significant chest pain I have recommended initiation of heparin and will initiate low-dose IV nitroglycerin.  We will plan for tentative repeat cardiac catheterization in light of her similar symptomatology initial presentation. I have reviewed the risks, indications, and alternatives to cardiac catheterization, possible angioplasty, and stenting with the patient. Risks include  but are not limited to bleeding, infection, vascular injury, stroke, myocardial infection, arrhythmia, kidney injury, radiation-related injury in the case of prolonged fluoroscopy use, emergency cardiac surgery, and death. The patient understands the risks of serious complication is 1-2 in 3762 with diagnostic cardiac cath and 1-2% or less with angioplasty/stenting.     Troy Sine, MD, [facility] 05/12/2020 5:39 PM

## 2020-05-12 NOTE — ED Provider Notes (Signed)
Signout from Dr. Tyrone Nine.  65 year old female with known cardiac disease here with pain similar to when had cardiac event.  EKG and troponins unremarkable.  Cardiology has been consulted.  Disposition per results of cardiology evaluation. Physical Exam  BP (!) 168/91   Pulse 80   Temp 98.2 F (36.8 C) (Oral)   Resp 16   Ht 6' (1.829 m)   Wt 117.5 kg   SpO2 100%   BMI 35.13 kg/m   Physical Exam  ED Course/Procedures     Procedures  MDM  Cardiology has evaluated patient and are admitting to their service.       Hayden Rasmussen, MD 05/13/20 [phone]

## 2020-05-12 NOTE — ED Provider Notes (Signed)
[facility] EMERGENCY DEPARTMENT Provider Note   CSN: [id number] Arrival date & time: 05/12/20  1116     History No chief complaint on file.   Destiny Beasley is a 65 y.o. female.  65 yo F with a chief complaints of chest pain.  This described as a pressure.  Nonradiating.  Nothing seems to make it better or worse.  Started this morning about 8 AM.  She tried reflux medicine she tried nitro she tried aspirin without improvement.  She called her doctor multiple times and eventually they encouraged her to come to the ED for evaluation.  She has had some shortness of breath and describes the sensation somewhat similar to when she has an asthma attack.  She tried her inhaler also without improvement.  She denies abdominal pain nausea vomiting or diarrhea.  Denies trauma.  She does have a history of an MI.  At the time of her MI she had no chest pain.  She denies history of PE or DVT denies hemoptysis denies unilateral extreme edema denies recent surgery immobilization or hospitalization denies estrogen use.  Remote history of cancer.  The history is provided by the patient.  Chest Pain Pain location:  Substernal area Pain quality: pressure   Pain radiates to:  Does not radiate Pain severity:  Moderate Onset quality:  Sudden Duration:  5 hours Timing:  Constant Progression:  Unchanged Chronicity:  New Relieved by:  Nothing Worsened by:  Nothing Ineffective treatments:  Aspirin, nitroglycerin and rest (reflux med) Associated symptoms: shortness of breath   Associated symptoms: no dizziness, no fever, no headache, no nausea, no palpitations and no vomiting        Past Medical History:  Diagnosis Date  . Anemia   . Anxiety   . CAD in native artery    a. Inf STEMI 11/9379 complicated by cardiogenic shock/complete heart block s/p emergent cath showing culprit large dominant LCx s/p aspiration thrombectomy and DES, EF 50% by cath, 40-45% by echo.  . Chronic systolic  heart failure ([place]) 06/25/2017   Echo 11/19: mild LVH, EF 40-45, inf-lat HK, Gr 1 DD, trivial MR  . Complete heart block, transient ([place])    a. 06/2017 in setting of acute inf MI -> resolved after PCI.  Marland Kitchen COVID-19   . Diabetes mellitus   . Goiter   . History of radiation therapy 06/14/2016 - 07/19/2016   Right Breast 50 Gy 25 fractions  . Hypercholesteremia   . Hypertension   . Ischemic cardiomyopathy    a. EF 50% by cath and 40-45% by echo 06/2017.  . Leg pain    ABIs 07/2019: Normal (R 1.27; L 1.25)  . Malignant neoplasm of upper-outer quadrant of right female breast ([place]) 02/24/2016  . Meningioma ([place])   . Nuclear stress test    Nuclear stress test 10/19: EF 35, inferolateral, apical scar; inferior, apical inferior infarct with mild peri-infarct ischemia; high risk  . Obesity   . Personal history of chemotherapy   . Personal history of radiation therapy   . Pituitary tumor   . Seasonal asthma   . Sleep apnea    does not use every night  . Stroke [facility])    TIA - Dec 1 st, 2017    Patient Active Problem List   Diagnosis Date Noted  . Chest pain 06/26/2019  . History of TIA (transient ischemic attack) 08/08/2017  . Malignant neoplasm of breast, stage 4 ([place]) 08/08/2017  . History of complete heart  block in setting of STEMI 06/2017 06/29/2017  . OSA on CPAP 06/29/2017  . CAD (coronary artery disease) 06/29/2017  . Hx of Infero-Posterior ST Elevation MI in 06/2017 06/25/2017  . History of cardiogenic shock in setting of STEMI in 06/2017 06/25/2017  . Chronic systolic heart failure ([place]) 06/25/2017  . Malignant neoplasm of upper-outer quadrant of right female breast ([place]) 02/24/2016  . Type II diabetes mellitus, uncontrolled ([place]) 02/11/2014  . Essential hypertension 09/07/2013  . Atypical chest pain 11/11/2011  . Anxiety 11/11/2011  . Insulin dependent diabetes mellitus 11/11/2011  . Hypercholesterolemia 11/11/2011    Past Surgical History:  Procedure Laterality Date  .  ABDOMINAL HYSTERECTOMY    . BRAIN MENINGIOMA EXCISION  2005  . BREAST BIOPSY    . BREAST LUMPECTOMY Right    2017  . BREAST LUMPECTOMY WITH RADIOACTIVE SEED LOCALIZATION Right 04/20/2016   Procedure: RIGHT BREAST LUMPECTOMY WITH RADIOACTIVE SEED LOCALIZATION;  Surgeon: Fanny Skates, MD;  Location: [facility];  Service: General;  Laterality: Right;  . BREAST REDUCTION SURGERY Bilateral 10/23/2016   Procedure: BILATERAL BREAST REDUCTION WITH LIPOSUCTION ASSISTANCE;  Surgeon: Cristine Polio, MD;  Location: [facility];  Service: Plastics;  Laterality: Bilateral;  . CESAREAN SECTION    . CORONARY STENT INTERVENTION N/A 06/25/2017   Procedure: CORONARY STENT INTERVENTION;  Surgeon: Sherren Mocha, MD;  Location: [facility] CV LAB;  Service: Cardiovascular;  Laterality: N/A;  . CORONARY/GRAFT ACUTE MI REVASCULARIZATION N/A 06/25/2017   Procedure: Coronary/Graft Acute MI Revascularization;  Surgeon: Sherren Mocha, MD;  Location: [facility] CV LAB;  Service: Cardiovascular;  Laterality: N/A;  . FOOT SURGERY Bilateral 2016   hammer toe surgery  . LEFT HEART CATH AND CORONARY ANGIOGRAPHY N/A 06/25/2017   Procedure: LEFT HEART CATH AND CORONARY ANGIOGRAPHY;  Surgeon: Sherren Mocha, MD;  Location: [facility] CV LAB;  Service: Cardiovascular;  Laterality: N/A;  . MENISCUS REPAIR Left 2015  . PITUITARY SURGERY    . REDUCTION MAMMAPLASTY    . RIGHT HEART CATH N/A 06/25/2017   Procedure: RIGHT HEART CATH;  Surgeon: Sherren Mocha, MD;  Location: [facility] CV LAB;  Service: Cardiovascular;  Laterality: N/A;  . THYROID SURGERY       OB History    Gravida  4   Para      Term      Preterm      AB      Living  2     SAB      IAB      Ectopic      Multiple      Live Births              Family History  Problem Relation Age of Onset  . Diabetes Mother   . Hyperlipidemia Mother   . Hypertension Mother     Social History   Tobacco Use  .  Smoking status: Never Smoker  . Smokeless tobacco: Never Used  Vaping Use  . Vaping Use: Never used  Substance Use Topics  . Alcohol use: Yes    Alcohol/week: 1.0 standard drink    Types: 1 Glasses of wine per week    Comment: 1 glass monthly  . Drug use: No    Home Medications Prior to Admission medications   Medication Sig Start Date End Date Taking? Authorizing Provider  ADVAIR HFA 115-21 MCG/ACT inhaler Inhale 2 puffs into the lungs in the morning and at bedtime. 04/12/20  Yes [provider]  Alirocumab (  PRALUENT) 75 MG/ML SOAJ Inject 1 pen into the skin every 14 (fourteen) days. 09/29/19  Yes Tonny Bollman, MD  ALPRAZolam Prudy Feeler) 0.25 MG tablet Take 0.25 mg by mouth 2 (two) times daily as needed (anxiety attack).   Yes [provider]  amLODipine (NORVASC) 10 MG tablet Take 1 tablet (10 mg total) by mouth daily. 02/18/20  Yes Tereso Newcomer T, PA-C  aspirin EC 81 MG tablet Take 1 tablet (81 mg total) by mouth daily. 01/01/19  Yes Weaver, Scott T, PA-C  carvedilol (COREG) 12.5 MG tablet Take 1.5 tablets (18.75 mg total) by mouth 2 (two) times daily. 07/11/19  Yes Weaver, Scott T, PA-C  Cholecalciferol (VITAMIN D-3 PO) Take 1 tablet by mouth daily.    Yes [provider]  hydrochlorothiazide (HYDRODIURIL) 25 MG tablet TAKE 1 TABLET(25 MG) BY MOUTH DAILY Patient taking differently: Take 25 mg by mouth daily. 03/24/20  Yes Weaver, Scott T, PA-C  insulin lispro (HUMALOG) 100 UNIT/ML cartridge ADMINISTER 50 UNITS UNDER THE SKIN EVERY MORNING AND EVERY NIGHT AT BEDTIME Patient taking differently: Inject 3-25 Units into the skin 3 (three) times daily with meals. Per sliding scale. 04/21/20  Yes Reather Littler, MD  irbesartan (AVAPRO) 300 MG tablet Take 300 mg by mouth daily.   Yes [provider]  LANTUS SOLOSTAR 100 UNIT/ML Solostar Pen ADMINISTER 50 UNITS UNDER THE SKIN TWICE DAILY Patient taking differently: Inject 50 Units into the skin daily. 05/05/20  Yes  Reather Littler, MD  MULTIPLE VITAMIN PO Take 1 tablet daily by mouth.   Yes [provider]  nitroGLYCERIN (NITROSTAT) 0.4 MG SL tablet Place 1 tablet (0.4 mg total) under the tongue every 5 (five) minutes as needed for chest pain. 06/24/19 09/22/19 Yes Weaver, Scott T, PA-C  pantoprazole (PROTONIX) 40 MG tablet Take 40 mg by mouth daily. 05/10/20  Yes [provider]  valACYclovir (VALTREX) 1000 MG tablet Take 1,000 mg by mouth 2 (two) times daily. 05/02/20  Yes [provider]  Venlafaxine HCl 225 MG TB24 Take 225 mg by mouth daily.   Yes [provider]  Continuous Blood Gluc Receiver (FREESTYLE LIBRE 2 READER) DEVI Use reader to monitor blood sugars continuously. 03/23/20   Reather Littler, MD  Continuous Blood Gluc Sensor (FREESTYLE LIBRE 2 SENSOR) MISC USE 1 SENSOR ONCE EVERY 14  DAYS TO MONITOR BLOOD  SUGARS CONTINUOUSLY 05/03/20   Reather Littler, MD  Semaglutide (RYBELSUS) 14 MG TABS Take 1 tablet by mouth daily before breakfast. Patient not taking: No sig reported 03/16/20   Reather Littler, MD  terbinafine (LAMISIL) 250 MG tablet Take 250 mg by mouth daily. 05/04/20   [provider]    Allergies    Hydrocodone, Invokana [canagliflozin], Evolocumab, Hydralazine, Imdur [isosorbide nitrate], Rosuvastatin, Simvastatin, Spironolactone, and Atorvastatin  Review of Systems   Review of Systems  Constitutional: Negative for chills and fever.  HENT: Negative for congestion and rhinorrhea.   Eyes: Negative for redness and visual disturbance.  Respiratory: Positive for shortness of breath. Negative for wheezing.   Cardiovascular: Positive for chest pain. Negative for palpitations.  Gastrointestinal: Negative for nausea and vomiting.  Genitourinary: Negative for dysuria and urgency.  Musculoskeletal: Negative for arthralgias and myalgias.  Skin: Negative for pallor and wound.  Neurological: Negative for dizziness and headaches.    Physical Exam Updated Vital  Signs BP (!) 174/97   Pulse 78   Temp 98.2 F (36.8 C) (Oral)   Resp 14   Ht 6' (1.829 m)  Wt 117.5 kg   SpO2 100%   BMI 35.13 kg/m   Physical Exam Vitals and nursing note reviewed.  Constitutional:      General: She is not in acute distress.    Appearance: She is well-developed and well-nourished. She is not diaphoretic.     Comments: BMI 35  HENT:     Head: Normocephalic and atraumatic.  Eyes:     Extraocular Movements: EOM normal.     Pupils: Pupils are equal, round, and reactive to light.  Cardiovascular:     Rate and Rhythm: Normal rate and regular rhythm.     Heart sounds: No murmur heard. No friction rub. No gallop.      Comments: No appreciable pain with palpation of the chest wall  Pulmonary:     Effort: Pulmonary effort is normal.     Breath sounds: No wheezing or rales.  Abdominal:     General: There is no distension.     Palpations: Abdomen is soft.     Tenderness: There is no abdominal tenderness.  Musculoskeletal:        General: No tenderness or edema.     Cervical back: Normal range of motion and neck supple.  Skin:    General: Skin is warm and dry.  Neurological:     Mental Status: She is alert and oriented to person, place, and time.  Psychiatric:        Mood and Affect: Mood and affect normal.        Behavior: Behavior normal.     ED Results / Procedures / Treatments   Labs (all labs ordered are listed, but only abnormal results are displayed) Labs Reviewed  BASIC METABOLIC PANEL - Abnormal; Notable for the following components:      Result Value   Glucose, Bld 196 (*)    All other components within normal limits  CBC  PROTIME-INR  TROPONIN I (HIGH SENSITIVITY)  TROPONIN I (HIGH SENSITIVITY)    EKG EKG Interpretation  Date/Time:  Wednesday May 12 2020 12:29:11 EST Ventricular Rate:  79 PR Interval:    QRS Duration: 91 QT Interval:  379 QTC Calculation: 435 R Axis:   47 Text Interpretation: Sinus rhythm Inferoposterior  infarct, old No significant change since last tracing Confirmed by Deno Etienne [phone]) on 05/12/2020 1:03:39 PM   Radiology DG Chest 2 View  Result Date: 05/12/2020 CLINICAL DATA:  Chest pain EXAM: CHEST - 2 VIEW COMPARISON:  November 16, 2019. FINDINGS: Lungs are clear. Heart size and pulmonary vascularity are normal. No adenopathy. No pneumothorax. No bone lesions. IMPRESSION: Lungs clear.  Cardiac silhouette normal. Electronically Signed   By: Lowella Grip III M.D.   On: 05/12/2020 12:25    Procedures Procedures (including critical care time)  Medications Ordered in ED Medications  morphine 2 MG/ML injection 2 mg (has no administration in time range)  alum & mag hydroxide-simeth (MAALOX/MYLANTA) 200-200-20 MG/5ML suspension 30 mL (30 mLs Oral Given 05/12/20 1306)  morphine 2 MG/ML injection 2 mg (2 mg Intravenous Given 05/12/20 1306)    ED Course  I have reviewed the triage vital signs and the nursing notes.  Pertinent labs & imaging results that were available during my care of the patient were reviewed by me and considered in my medical decision making (see chart for details).    MDM Rules/Calculators/A&P                          65  yo F with a chief complaint of chest pain.  This feels like a pressure.  Nonradiating.  She has a history of MI but never had pain with it.  EKG with no change troponin negative.  No anemia no significant electrolyte abnormality.  Chest x-ray viewed by me without focal infiltrate or pneumothorax.  Will give a GI cocktail small dose of morphine repeat troponin reassess.  Patient with ongoing pain states that it radiates to her back.  Seems similar to the pain she described when she had acute ST elevation MI back in 2019.  She is having trouble remembering if it similar or not.  Will discuss with cardiology.  Signed out to Dr. Melina Copa, please see their note for further details of care in the ED.   The patients results and plan were reviewed and discussed.    Any x-rays performed were independently reviewed by myself.   Differential diagnosis were considered with the presenting HPI.  Medications  morphine 2 MG/ML injection 2 mg (has no administration in time range)  alum & mag hydroxide-simeth (MAALOX/MYLANTA) 200-200-20 MG/5ML suspension 30 mL (30 mLs Oral Given 05/12/20 1306)  morphine 2 MG/ML injection 2 mg (2 mg Intravenous Given 05/12/20 1306)    Vitals:   05/12/20 1300 05/12/20 1315 05/12/20 1345 05/12/20 1400  BP: (!) 163/80 (!) 171/102 (!) 170/88 (!) 174/97  Pulse: 85 88 75 78  Resp: 18 (!) 22 14 14   Temp:      TempSrc:      SpO2: 100% 100% 100% 100%  Weight:      Height:        Final diagnoses:  Chest pain with moderate risk for cardiac etiology    Admission/ observation were discussed with the admitting physician, patient and/or family and they are comfortable with the plan.   Final Clinical Impression(s) / ED Diagnoses Final diagnoses:  Chest pain with moderate risk for cardiac etiology    Rx / DC Orders ED Discharge Orders    None       Deno Etienne, DO 05/12/20 1519

## 2020-05-12 NOTE — Progress Notes (Signed)
ANTICOAGULATION CONSULT NOTE - Initial Consult  Pharmacy Consult for IV heparin Indication: chest pain/ACS  Allergies  Allergen Reactions  . Hydrocodone Other (See Comments)    Severe anxiety Confusion   . Invokana [Canagliflozin] Other (See Comments)    Constant yeast infections  . Evolocumab     Injection site pain  . Hydralazine     Fatigue  . Imdur [Isosorbide Nitrate] Other (See Comments)    Patient reported wheezing  . Rosuvastatin     myalgias  . Simvastatin     myalgias  . Spironolactone     Wheezing vs fatigue?  Marland Kitchen Atorvastatin Other (See Comments)    DIZZINESS    Patient Measurements: Height: 6' (182.9 cm) Weight: 117.5 kg (259 lb) IBW/kg (Calculated) : 73.1 Heparin Dosing Weight: 99.2 kg  Vital Signs: Temp: 98.2 F (36.8 C) (01/19 1126) Temp Source: Oral (01/19 1126) BP: 168/91 (01/19 1600) Pulse Rate: 80 (01/19 1600)  Labs: Recent Labs    05/12/20 1135 05/12/20 1323  HGB 13.3  --   HCT 42.7  --   PLT 361  --   LABPROT 13.2  --   INR 1.0  --   CREATININE 0.91  --   TROPONINIHS 6 7    Estimated Creatinine Clearance: 92 mL/min (by C-G formula based on SCr of 0.91 mg/dL).   Medical History: Past Medical History:  Diagnosis Date  . Anemia   . Anxiety   . CAD in native artery    a. Inf STEMI 07/7094 complicated by cardiogenic shock/complete heart block s/p emergent cath showing culprit large dominant LCx s/p aspiration thrombectomy and DES, EF 50% by cath, 40-45% by echo.  . Chronic systolic heart failure ([place]) 06/25/2017   Echo 11/19: mild LVH, EF 40-45, inf-lat HK, Gr 1 DD, trivial MR  . Complete heart block, transient ([place])    a. 06/2017 in setting of acute inf MI -> resolved after PCI.  Marland Kitchen COVID-19   . Diabetes mellitus   . Goiter   . History of radiation therapy 06/14/2016 - 07/19/2016   Right Breast 50 Gy 25 fractions  . Hypercholesteremia   . Hypertension   . Ischemic cardiomyopathy    a. EF 50% by cath and 40-45% by echo 06/2017.   . Leg pain    ABIs 07/2019: Normal (R 1.27; L 1.25)  . Malignant neoplasm of upper-outer quadrant of right female breast ([place]) 02/24/2016  . Meningioma ([place])   . Nuclear stress test    Nuclear stress test 10/19: EF 35, inferolateral, apical scar; inferior, apical inferior infarct with mild peri-infarct ischemia; high risk  . Obesity   . Personal history of chemotherapy   . Personal history of radiation therapy   . Pituitary tumor   . Seasonal asthma   . Sleep apnea    does not use every night  . Stroke [facility])    TIA - Dec 1 st, 2017    Medications:  (Not in a hospital admission)   Assessment: 65 year old female presented to ED with chest pressure. PMH includes CAD s/p DES, HF, HTN, HLD, DM, OSA, TIA, hx of breast cancer. Pharmacy consulted to start heparin infusion for ACS.  High sensitivity troponin wnl x2. EKG without acute ischemic changes. Cardiology planning for cath tomorrow 1/20 in AM.   No anticoagulation prior to admission. Hgb 13.3, platelets 361.  Goal of Therapy:  Heparin level 0.3-0.7 units/ml Monitor platelets by anticoagulation protocol: Yes   Plan:  Give 4000 units bolus x  1 Start heparin infusion at 1350 units/hr  Heparin level in 6 hours Daily heparin level, CBC Monitor s/sx bleeding  Fara Olden, PharmD PGY-1 Pharmacy Resident 05/12/2020 5:07 PM Please see AMION for all pharmacy numbers

## 2020-05-12 NOTE — Telephone Encounter (Signed)
Pt needs to go to the ED.  It looks like she is there now. Richardson Dopp, PA-C    05/12/2020 1:53 PM

## 2020-05-12 NOTE — Plan of Care (Signed)

## 2020-05-12 NOTE — ED Triage Notes (Signed)
Patient arrived by [facility] for CP that started this am at 0800. Patient describes as anterior central pain. Took asa and 3 SL ntg prior to EMS arrival. EMS administered additional NTG with relief. Patient in NAD

## 2020-05-13 ENCOUNTER — Observation Stay (HOSPITAL_BASED_OUTPATIENT_CLINIC_OR_DEPARTMENT_OTHER): Payer: 59

## 2020-05-13 ENCOUNTER — Encounter (HOSPITAL_COMMUNITY)
Admission: EM | Disposition: A | Discharge status: Home / Self Care | Payer: Self-pay | Source: Home / Self Care | Attending: Emergency Medicine

## 2020-05-13 ENCOUNTER — Encounter (HOSPITAL_COMMUNITY): Payer: Self-pay | Admitting: Cardiovascular Disease

## 2020-05-13 DIAGNOSIS — R079 Chest pain, unspecified: Secondary | ICD-10-CM | POA: Diagnosis not present

## 2020-05-13 DIAGNOSIS — I2510 Atherosclerotic heart disease of native coronary artery without angina pectoris: Secondary | ICD-10-CM | POA: Diagnosis not present

## 2020-05-13 DIAGNOSIS — J45909 Unspecified asthma, uncomplicated: Secondary | ICD-10-CM | POA: Insufficient documentation

## 2020-05-13 DIAGNOSIS — E785 Hyperlipidemia, unspecified: Secondary | ICD-10-CM | POA: Diagnosis not present

## 2020-05-13 DIAGNOSIS — I10 Essential (primary) hypertension: Secondary | ICD-10-CM | POA: Diagnosis not present

## 2020-05-13 DIAGNOSIS — Z86018 Personal history of other benign neoplasm: Secondary | ICD-10-CM | POA: Insufficient documentation

## 2020-05-13 HISTORY — PX: LEFT HEART CATH AND CORONARY ANGIOGRAPHY: CATH118249

## 2020-05-13 LAB — BASIC METABOLIC PANEL
Anion gap: 10 (ref 5–15)
BUN: 15 mg/dL (ref 8–23)
CO2: 24 mmol/L (ref 22–32)
Calcium: 9 mg/dL (ref 8.9–10.3)
Chloride: 102 mmol/L (ref 98–111)
Creatinine, Ser: 0.91 mg/dL (ref 0.44–1.00)
GFR, Estimated: >60 mL/min (ref >60)
Glucose, Bld: 184 mg/dL — ABNORMAL HIGH (ref 70–99)
Potassium: 3.8 mmol/L (ref 3.5–5.1)
Sodium: 136 mmol/L (ref 135–145)

## 2020-05-13 LAB — HEPARIN LEVEL (UNFRACTIONATED)
Heparin Unfractionated: 0.32 IU/mL (ref 0.30–0.70)
Heparin Unfractionated: 0.38 IU/mL (ref 0.30–0.70)

## 2020-05-13 LAB — CBC
HCT: 40.1 % (ref 36.0–46.0)
Hemoglobin: 12.7 g/dL (ref 12.0–15.0)
MCH: 28.9 pg (ref 26.0–34.0)
MCHC: 31.7 g/dL (ref 30.0–36.0)
MCV: 91.3 fL (ref 80.0–100.0)
Platelets: 311 10*3/uL (ref 150–400)
RBC: 4.39 MIL/uL (ref 3.87–5.11)
RDW: 13 % (ref 11.5–15.5)
WBC: 8.8 10*3/uL (ref 4.0–10.5)
nRBC: 0 % (ref 0.0–0.2)

## 2020-05-13 LAB — ECHOCARDIOGRAM COMPLETE
Area-P 1/2: 4.89 cm2
Height: 72 in
S' Lateral: 3.6 cm
Weight: 4166.4 oz

## 2020-05-13 LAB — SARS CORONAVIRUS 2 BY RT PCR (HOSPITAL ORDER, PERFORMED IN ~~LOC~~ HOSPITAL LAB): SARS Coronavirus 2: NEGATIVE

## 2020-05-13 LAB — GLUCOSE, CAPILLARY
Glucose-Capillary: 193 mg/dL — ABNORMAL HIGH (ref 70–99)
Glucose-Capillary: 321 mg/dL — ABNORMAL HIGH (ref 70–99)

## 2020-05-13 SURGERY — LEFT HEART CATH AND CORONARY ANGIOGRAPHY
Anesthesia: LOCAL

## 2020-05-13 MED ORDER — SODIUM CHLORIDE 0.9% FLUSH: 3.0000 mL | Freq: Two times a day (BID) | INTRAVENOUS | Status: DC

## 2020-05-13 MED ORDER — VERAPAMIL HCL 2.5 MG/ML IV SOLN
INTRA_ARTERIAL | Status: DC | PRN
  Administered 2020-05-13: 10 mL via INTRA_ARTERIAL

## 2020-05-13 MED ORDER — MIDAZOLAM HCL 2 MG/2ML IJ SOLN
INTRAMUSCULAR | Status: AC
  Filled 2020-05-13: qty 2

## 2020-05-13 MED ORDER — FENTANYL CITRATE (PF) 100 MCG/2ML IJ SOLN
INTRAMUSCULAR | Status: AC
  Filled 2020-05-13: qty 2

## 2020-05-13 MED ORDER — FENTANYL CITRATE (PF) 100 MCG/2ML IJ SOLN
INTRAMUSCULAR | Status: DC | PRN
  Administered 2020-05-13: 25 ug via INTRAVENOUS

## 2020-05-13 MED ORDER — LABETALOL HCL 5 MG/ML IV SOLN: 10.0000 mg | INTRAVENOUS | Status: AC | PRN

## 2020-05-13 MED ORDER — HEPARIN (PORCINE) IN NACL 1000-0.9 UT/500ML-% IV SOLN
INTRAVENOUS | Status: DC | PRN
  Administered 2020-05-13 (×2): 500 mL

## 2020-05-13 MED ORDER — SODIUM CHLORIDE 0.9 % IV SOLN: 250.0000 mL | INTRAVENOUS | Status: DC | PRN

## 2020-05-13 MED ORDER — MIDAZOLAM HCL 2 MG/2ML IJ SOLN
INTRAMUSCULAR | Status: DC | PRN
  Administered 2020-05-13: 1 mg via INTRAVENOUS

## 2020-05-13 MED ORDER — ONDANSETRON HCL 4 MG/2ML IJ SOLN: 4.0000 mg | Freq: Four times a day (QID) | INTRAMUSCULAR | Status: DC | PRN

## 2020-05-13 MED ORDER — HEPARIN SODIUM (PORCINE) 1000 UNIT/ML IJ SOLN
INTRAMUSCULAR | Status: DC | PRN
  Administered 2020-05-13: 6000 [IU] via INTRAVENOUS

## 2020-05-13 MED ORDER — SODIUM CHLORIDE 0.9% FLUSH
3.0000 mL | INTRAVENOUS | Status: DC | PRN
Start: 2020-05-13 — End: 2020-05-13

## 2020-05-13 MED ORDER — LIDOCAINE HCL (PF) 1 % IJ SOLN
INTRAMUSCULAR | Status: AC
  Filled 2020-05-13: qty 30

## 2020-05-13 MED ORDER — HEPARIN SODIUM (PORCINE) 1000 UNIT/ML IJ SOLN
INTRAMUSCULAR | Status: AC
  Filled 2020-05-13: qty 1

## 2020-05-13 MED ORDER — ACETAMINOPHEN 325 MG PO TABS: 650.0000 mg | ORAL_TABLET | ORAL | Status: DC | PRN

## 2020-05-13 MED ORDER — VERAPAMIL HCL 2.5 MG/ML IV SOLN
INTRAVENOUS | Status: AC
  Filled 2020-05-13: qty 2

## 2020-05-13 MED ORDER — HEPARIN (PORCINE) IN NACL 1000-0.9 UT/500ML-% IV SOLN
INTRAVENOUS | Status: AC
  Filled 2020-05-13: qty 1000

## 2020-05-13 MED ORDER — IOHEXOL 350 MG/ML SOLN
INTRAVENOUS | Status: DC | PRN
  Administered 2020-05-13: 60 mL via INTRA_ARTERIAL

## 2020-05-13 MED ORDER — ASPIRIN 81 MG PO CHEW
81.0000 mg | CHEWABLE_TABLET | Freq: Every day | ORAL | Status: DC
  Filled 2020-05-13: qty 1

## 2020-05-13 MED ORDER — NITROGLYCERIN 1 MG/10 ML FOR IR/CATH LAB
INTRA_ARTERIAL | Status: AC
  Filled 2020-05-13: qty 10

## 2020-05-13 MED ORDER — HYDRALAZINE HCL 20 MG/ML IJ SOLN: 10.0000 mg | INTRAMUSCULAR | Status: AC | PRN

## 2020-05-13 MED ORDER — SODIUM CHLORIDE 0.9 % IV SOLN: INTRAVENOUS | Status: AC

## 2020-05-13 MED ORDER — LIDOCAINE HCL (PF) 1 % IJ SOLN
INTRAMUSCULAR | Status: DC | PRN
  Administered 2020-05-13: 2 mL

## 2020-05-13 SURGICAL SUPPLY — 15 items
BAG SNAP BAND KOVER 36X36 (MISCELLANEOUS) ×2 IMPLANT
CATH INFINITI 5FR ANG PIGTAIL (CATHETERS) ×2 IMPLANT
CATH OPTITORQUE TIG 4.0 5F (CATHETERS) ×2 IMPLANT
COVER DOME SNAP 22 D (MISCELLANEOUS) ×2 IMPLANT
DEVICE RAD COMP TR BAND LRG (VASCULAR PRODUCTS) ×2 IMPLANT
GLIDESHEATH SLEND A-KIT 6F 22G (SHEATH) ×2 IMPLANT
GUIDEWIRE INQWIRE 1.5J.035X260 (WIRE) ×1 IMPLANT
INQWIRE 1.5J .035X260CM (WIRE) ×2
KIT HEART LEFT (KITS) ×2 IMPLANT
PACK CARDIAC CATHETERIZATION (CUSTOM PROCEDURE TRAY) ×2 IMPLANT
SHEATH PROBE COVER 6X72 (BAG) ×2 IMPLANT
SYR MEDRAD MARK 7 150ML (SYRINGE) ×2 IMPLANT
TRANSDUCER W/STOPCOCK (MISCELLANEOUS) ×2 IMPLANT
TUBING CIL FLEX 10 FLL-RA (TUBING) ×2 IMPLANT
WIRE HI TORQ VERSACORE-J 145CM (WIRE) ×2 IMPLANT

## 2020-05-13 NOTE — Discharge Summary (Addendum)
Discharge Summary    Patient ID: Destiny Beasley MRN: [id number]; DOB: [birth year]  Admit date: 05/12/2020 Discharge date: 05/13/2020  Primary Care Provider: Lawerance Cruel, MD  Primary Cardiologist: Sherren Mocha, MD  Primary Electrophysiologist:  None   Discharge Diagnoses    Active Problems:   Chest pain with moderate risk for cardiac etiology   Unstable angina [facility])    Diagnostic Studies/Procedures    Cath: 05/13/20     Previously placed Mid Cx to Dist Cx stent (unknown type) is widely patent.  Prox LAD lesion is 40% stenosed.  There is mild to moderate left ventricular systolic dysfunction.  LV end diastolic pressure is normal.  The left ventricular ejection fraction is 45-50% by visual estimate.  IMPRESSION: Destiny Beasley had a dominant circumflex stent was widely patent.  She has mild proximal LAD disease.  She has mild LV dysfunction with an EF of approximate 45 to 50% with inferoapical hypokinesia.  There were no "culprit lesions" identifiable that would contribute to chest pain.  Recommend continued medical therapy.  The sheath was removed and a TR band was placed on the right wrist to achieve patent hemostasis. The patient left the lab in stable condition.  Destiny Beasley. MD, [facility] 05/13/2020 8:26 AM   _____________   History of Present Illness     Destiny Beasley is a 65 y.o. female with coronary artery disease s/p DES to proximal circumflex, chronic systolic heart failure/ischemic cardiomyopathy, hypertension, hyperlipidemia with statin intolerance, diabetes mellitus, obstructive sleep apnea on BiPAP, TIA, breast cancer and history of COVID-11 July 2019 presented for chest pressure.  History of inferior lateral STEMI in March [id number] complicated by cardiogenic shock, acute heart failure and complete heart block.  Underwent PCI with DES to circumflex.  EF was 40 to 45% at that time.  Unable to tolerate Entresto in the past.  Also had difficulty with  spironolactone.  Last Myoview October 2019 with inferior scar and mild peri-infarct ischemia. Last echocardiogram May 2020 with EF of 40 to 45%.  She was doing well on cardiac standpoint when last seen by Richardson Dopp, PA July 2021.  Destiny Beasley was in usual state of health up until the morning of admission when she had sudden onset substernal chest pressure around 8 AM.  It was 8 out of 10 in intensity. Associated with shortness of breath and radiating to her upper back and left jaw/shoulder. She took sublingual nitroglycerin x3 without improvement and EMS was called.  She was given aspirin 324 mg and an additional sublingual nitroglycerin without significant improvement on her pain.  Currently reporting 8 out of 10 pressure.  Symptoms similar to prior MI.  No regular exercise.  Denied dizziness, palpitation, orthopnea, PND, syncope, lower extremity edema or melena.  Compliant with BiPAP.  Patient denies recent travel or surgery.  She reported vaccinated for COVID-19 and also has received booster.  High-sensitivity troponin negative x2.  Electrolytes and renal function are normal.  Chest x-ray without acute abnormality. BP 160-170/90s. She was admitted with plans to undergo cardiac cath.   Hospital Course     1. Chest pressure concerning for unstable angina: Patient presented with acute onset substernal chest pressure with associated shortness of breath and radiation to her upper back and left neck/shoulder.  Reports symptoms similar to prior MI in 2019.  She took total sublingual nitroglycerin x4, IV morphine and Maalox without improvement on her symptoms. Troponin negative x2.  EKG without acute ischemic changes. Underwent cardiac cath noted above  with patent Lcx stent with no culprit lesion noted. Recommendations to continue with medical therapy -- suspect GI component to symptoms. She is planning for outpatient GI follow up at the time of discharge  2. CAD s/p DES to proximal circumflex in  06/2017: Last Myoview October 2019 with inferior scar and mild peri-infarct ischemia. -- Continue aspirin, Coreg and ARB  3. Chronic systolic heart failure: Last echocardiogram May 2020 with EF of 40 to 45%. -- Patient appears euvolemic by exam -- Per notes, intolerance to Entresto and spironolactone -- Continue home carvedilol and irbesartan  4. Hyperlipidemia: Statin intolerance -- On PCSK9 inhibitor at home  5. Hypertension: Continue home Coreg, amlodipine, HCTZ and Avapro  6. DM: resume home insulin regimen at discharge  General: Well developed, well nourished, female appearing in no acute distress. Head: Normocephalic, atraumatic.  Neck: Supple without bruits, JVD. Lungs:  Resp regular and unlabored, CTA. Heart: RRR, S1, S2, no S3, S4, or murmur; no rub. Abdomen: Soft, non-tender, non-distended with normoactive bowel sounds. No hepatomegaly. No rebound/guarding. No obvious abdominal masses. Extremities: No clubbing, cyanosis, edema. Distal pedal pulses are 2+ bilaterally. Right cath site stable without bruising or hematoma Neuro: Alert and oriented X 3. Moves all extremities spontaneously. Psych: Normal affect.   Did the patient have an acute coronary syndrome (MI, NSTEMI, STEMI, etc) this admission?:  No                               Did the patient have a percutaneous coronary intervention (stent / angioplasty)?:  No.       _____________  Discharge Vitals Blood pressure (!) 145/77, pulse 87, temperature 98.3 F (36.8 C), temperature source Oral, resp. rate 18, height 6' (1.829 m), weight 118.1 kg, SpO2 100 %.  Filed Weights   05/12/20 1126 05/12/20 1736  Weight: 117.5 kg 118.1 kg    Labs & Radiologic Studies    CBC Recent Labs    05/12/20 1135 05/13/20 0314  WBC 6.9 8.8  HGB 13.3 12.7  HCT 42.7 40.1  MCV 92.6 91.3  PLT 361 132   Basic Metabolic Panel Recent Labs    05/12/20 1135 05/13/20 0317  NA 138 136  K 4.4 3.8  CL 101 102  CO2 28 24   GLUCOSE 196* 184*  BUN 15 15  CREATININE 0.91 0.91  CALCIUM 9.7 9.0   Liver Function Tests No results for input(s): AST, ALT, ALKPHOS, BILITOT, PROT, ALBUMIN in the last 72 hours. No results for input(s): LIPASE, AMYLASE in the last 72 hours. High Sensitivity Troponin:   Recent Labs  Lab 05/12/20 1135 05/12/20 1323  TROPONINIHS 6 7    BNP Invalid input(s): POCBNP D-Dimer No results for input(s): DDIMER in the last 72 hours. Hemoglobin A1C Recent Labs    05/12/20 2007  HGBA1C 8.3*   Fasting Lipid Panel No results for input(s): CHOL, HDL, LDLCALC, TRIG, CHOLHDL, LDLDIRECT in the last 72 hours. Thyroid Function Tests Recent Labs    05/12/20 2007  TSH 1.563   _____________  DG Chest 2 View  Result Date: 05/12/2020 CLINICAL DATA:  Chest pain EXAM: CHEST - 2 VIEW COMPARISON:  November 16, 2019. FINDINGS: Lungs are clear. Heart size and pulmonary vascularity are normal. No adenopathy. No pneumothorax. No bone lesions. IMPRESSION: Lungs clear.  Cardiac silhouette normal. Electronically Signed   By: Lowella Grip III M.D.   On: 05/12/2020 12:25   CARDIAC CATHETERIZATION  Result Date:  05/13/2020  Previously placed Mid Cx to Dist Cx stent (unknown type) is widely patent.  Prox LAD lesion is 40% stenosed.  There is mild to moderate left ventricular systolic dysfunction.  LV end diastolic pressure is normal.  The left ventricular ejection fraction is 45-50% by visual estimate.  Destiny Beasley is a 65 y.o. female  [id number] LOCATION:  FACILITY: [facility] PHYSICIAN: Destiny Beasley, M.D. 03/22/1956 DATE OF PROCEDURE:  05/13/2020 DATE OF DISCHARGE: CARDIAC CATHETERIZATION History obtained from chart review. Destiny Beasley is a 65 y.o. female with coronary artery disease s/p DES to proximal circumflex, chronic systolic heart failure/ischemic cardiomyopathy, hypertension, hyperlipidemia with statin intolerance, diabetes mellitus, obstructive sleep apnea on BiPAP, TIA, breast cancer and history  of COVID-11 July 2019 presented for chest pressure.  History of inferior lateral STEMI in March 5956 complicated by cardiogenic shock, acute heart failure and complete heart block.  Underwent PCI with DES to circumflex.  EF was 40 to 45% at that time.  Unable to tolerate Entresto in the past. She was admitted yesterday with unstable angina.  Her enzymes were negative and her EKG showed no acute changes.  She was placed on IV heparin.  She presents now for diagnostic coronary angiography.   Destiny Beasley had a dominant circumflex stent was widely patent.  She has mild proximal LAD disease.  She has mild LV dysfunction with an EF of approximate 45 to 50% with inferoapical hypokinesia.  There were no "culprit lesions" identifiable that would contribute to chest pain.  Recommend continued medical therapy.  The sheath was removed and a TR band was placed on the right wrist to achieve patent hemostasis.  The patient left the lab in stable condition. Destiny Beasley. MD, [facility] 05/13/2020 8:26 AM   Disposition   Pt is being discharged home today in good condition.  Follow-up Plans & Appointments     Follow-up Information    Lawerance Cruel, MD Follow up.   Specialty: Family Medicine Contact information: [place] [address] [phone]        Sherren Mocha, MD Follow up.   Specialty: Cardiology Why: as needed Contact information: [address] [address] [phone]              Discharge Instructions    Diet - low sodium heart healthy   Complete by: As directed    Discharge instructions   Complete by: As directed    Radial Site Care Refer to this sheet in the next few weeks. These instructions provide you with information on caring for yourself after your procedure. Your caregiver may also give you more specific instructions. Your treatment has been planned according to current medical practices, but problems sometimes occur. Call  your caregiver if you have any problems or questions after your procedure. HOME CARE INSTRUCTIONS You may shower the day after the procedure.Remove the bandage (dressing) and gently wash the site with plain soap and water.Gently pat the site dry.  Do not apply powder or lotion to the site.  Do not submerge the affected site in water for 3 to 5 days.  Inspect the site at least twice daily.  Do not flex or bend the affected arm for 24 hours.  No lifting over 5 pounds (2.3 kg) for 5 days after your procedure.  Do not drive home if you are discharged the same day of the procedure. Have someone else drive you.  You may drive 24 hours after the  procedure unless otherwise instructed by your caregiver.  What to expect: Any bruising will usually fade within 1 to 2 weeks.  Blood that collects in the tissue (hematoma) may be painful to the touch. It should usually decrease in size and tenderness within 1 to 2 weeks.  SEEK IMMEDIATE MEDICAL CARE IF: You have unusual pain at the radial site.  You have redness, warmth, swelling, or pain at the radial site.  You have drainage (other than a small amount of blood on the dressing).  You have chills.  You have a fever or persistent symptoms for more than 72 hours.  You have a fever and your symptoms suddenly get worse.  Your arm becomes pale, cool, tingly, or numb.  You have heavy bleeding from the site. Hold pressure on the site.   Increase activity slowly   Complete by: As directed       Discharge Medications   Allergies as of 05/13/2020      Reactions   Hydrocodone Other (See Comments)   Severe anxiety Confusion   Invokana [canagliflozin] Other (See Comments)   Constant yeast infections   Evolocumab    Injection site pain   Hydralazine    Fatigue   Imdur [isosorbide Nitrate] Other (See Comments)   Patient reported wheezing   Rosuvastatin    myalgias   Simvastatin    myalgias   Spironolactone    Wheezing vs fatigue?   Atorvastatin  Other (See Comments)   DIZZINESS      Medication List    TAKE these medications   Advair HFA 115-21 MCG/ACT inhaler Generic drug: fluticasone-salmeterol Inhale 2 puffs into the lungs in the morning and at bedtime.   ALPRAZolam 0.25 MG tablet Commonly known as: XANAX Take 0.25 mg by mouth 2 (two) times daily as needed (anxiety attack).   amLODipine 10 MG tablet Commonly known as: NORVASC Take 1 tablet (10 mg total) by mouth daily.   aspirin EC 81 MG tablet Take 1 tablet (81 mg total) by mouth daily.   carvedilol 12.5 MG tablet Commonly known as: COREG Take 1.5 tablets (18.75 mg total) by mouth 2 (two) times daily.   FreeStyle Libre 2 Reader [place] Use reader to monitor blood sugars continuously.   FreeStyle Libre 2 Sensor Misc USE 1 SENSOR ONCE EVERY 14  DAYS TO MONITOR BLOOD  SUGARS CONTINUOUSLY   HumaLOG 100 UNIT/ML cartridge Generic drug: insulin lispro ADMINISTER 50 UNITS UNDER THE SKIN EVERY MORNING AND EVERY NIGHT AT BEDTIME What changed:   how much to take  how to take this  when to take this  additional instructions   hydrochlorothiazide 25 MG tablet Commonly known as: HYDRODIURIL TAKE 1 TABLET(25 MG) BY MOUTH DAILY What changed: See the new instructions.   irbesartan 300 MG tablet Commonly known as: AVAPRO Take 300 mg by mouth daily.   Lantus SoloStar 100 UNIT/ML Solostar Pen Generic drug: insulin glargine ADMINISTER 50 UNITS UNDER THE SKIN TWICE DAILY What changed: See the new instructions.   MULTIPLE VITAMIN PO Take 1 tablet daily by mouth.   nitroGLYCERIN 0.4 MG SL tablet Commonly known as: NITROSTAT Place 1 tablet (0.4 mg total) under the tongue every 5 (five) minutes as needed for chest pain.   pantoprazole 40 MG tablet Commonly known as: PROTONIX Take 40 mg by mouth daily.   Praluent 75 MG/ML [place] Generic drug: Alirocumab Inject 1 pen into the skin every 14 (fourteen) days.   Rybelsus 14 MG Tabs Generic drug: Semaglutide Take 1  tablet by mouth daily before breakfast.   terbinafine 250 MG tablet Commonly known as: LAMISIL Take 250 mg by mouth daily.   valACYclovir 1000 MG tablet Commonly known as: VALTREX Take 1,000 mg by mouth 2 (two) times daily.   Venlafaxine HCl 225 MG Tb24 Take 225 mg by mouth daily.   VITAMIN D-3 PO Take 1 tablet by mouth daily.         Outstanding Labs/Studies   N/a   Duration of Discharge Encounter   Greater than 30 minutes including physician time.  Signed, Reino Bellis, NP 05/13/2020, 12:50 PM   Patient seen and examined. Agree with assessment and plan. Cath data reviewed; widely patent LCx stent in a dominant LCX vessel. Plan medical therapy. R radial site stable. Will dc today. Resting pulse elevated; For improved HR response increase coreg to 25 mg bid.   Troy Sine, MD, [facility] 05/13/2020 1:00 PM

## 2020-05-13 NOTE — Progress Notes (Signed)
  Echocardiogram 2D Echocardiogram has been performed.  Matilde Bash 05/13/2020, 2:22 PM

## 2020-05-13 NOTE — Progress Notes (Signed)
[place] for IV heparin Indication: chest pain/ACS  Allergies  Allergen Reactions  . Hydrocodone Other (See Comments)    Severe anxiety Confusion   . Invokana [Canagliflozin] Other (See Comments)    Constant yeast infections  . Evolocumab     Injection site pain  . Hydralazine     Fatigue  . Imdur [Isosorbide Nitrate] Other (See Comments)    Patient reported wheezing  . Rosuvastatin     myalgias  . Simvastatin     myalgias  . Spironolactone     Wheezing vs fatigue?  Marland Kitchen Atorvastatin Other (See Comments)    DIZZINESS    Patient Measurements: Height: 6' (182.9 cm) Weight: 118.1 kg (260 lb 6.4 oz) IBW/kg (Calculated) : 73.1 Heparin Dosing Weight: 99.2 kg  Vital Signs: Temp: 98.4 F (36.9 C) (01/19 1941) Temp Source: Oral (01/19 1941) BP: 157/83 (01/19 1941) Pulse Rate: 87 (01/19 1941)  Labs: Recent Labs    05/12/20 1135 05/12/20 1323 05/12/20 2320  HGB 13.3  --   --   HCT 42.7  --   --   PLT 361  --   --   LABPROT 13.2  --   --   INR 1.0  --   --   HEPARINUNFRC  --   --  0.32  CREATININE 0.91  --   --   TROPONINIHS 6 7  --     Estimated Creatinine Clearance: 92.2 mL/min (by C-G formula based on SCr of 0.91 mg/dL).  Assessment: 65 year old female presented to ED with chest pressure. PMH includes CAD s/p DES, HF, HTN, HLD, DM, OSA, TIA, hx of breast cancer. Pharmacy consulted to start heparin infusion for ACS. High sensitivity troponin wnl x2. EKG without acute ischemic changes. Cardiology planning for cath 1/20 in AM.   Heparin level therapeutic (0.32) on gtt at 1350 units/hr. No bleeding noted.  Goal of Therapy:  Heparin level 0.3-0.7 units/ml Monitor platelets by anticoagulation protocol: Yes   Plan:  Continue heparin at 1350 units/hr Daily heparin level, CBC  Sherlon Handing, PharmD, BCPS Please see amion for complete clinical pharmacist phone list 05/13/2020 12:36 AM

## 2020-05-13 NOTE — Plan of Care (Signed)
  Problem: Education: Goal: Knowledge of General Education information will improve Description: Including pain rating scale, medication(s)/side effects and non-pharmacologic comfort measures Outcome: Adequate for Discharge   

## 2020-05-13 NOTE — Interval H&P Note (Signed)
Cath Lab Visit (complete for each Cath Lab visit)  Clinical Evaluation Leading to the Procedure:   ACS: Yes.    Non-ACS:    Anginal Classification: CCS III  Anti-ischemic medical therapy: Minimal Therapy (1 class of medications)  Non-Invasive Test Results: No non-invasive testing performed  Prior CABG: No previous CABG      History and Physical Interval Note:  05/13/2020 7:43 AM  Destiny Beasley  has presented today for surgery, with the diagnosis of unstable angina.  The various methods of treatment have been discussed with the patient and family. After consideration of risks, benefits and other options for treatment, the patient has consented to  Procedure(s): LEFT HEART CATH AND CORONARY ANGIOGRAPHY (N/A) as a surgical intervention.  The patient's history has been reviewed, patient examined, no change in status, stable for surgery.  I have reviewed the patient's chart and labs.  Questions were answered to the patient's satisfaction.     Quay Burow

## 2020-05-13 NOTE — Progress Notes (Signed)
Discharge instructions (including medications) discussed with and copy provided to patient/caregiver 

## 2020-05-18 ENCOUNTER — Other Ambulatory Visit: Payer: Self-pay | Admitting: Physician Assistant

## 2020-05-24 ENCOUNTER — Other Ambulatory Visit (INDEPENDENT_AMBULATORY_CARE_PROVIDER_SITE_OTHER): Payer: 59

## 2020-05-24 ENCOUNTER — Other Ambulatory Visit: Payer: Self-pay

## 2020-05-24 DIAGNOSIS — Z794 Long term (current) use of insulin: Secondary | ICD-10-CM

## 2020-05-24 DIAGNOSIS — E1165 Type 2 diabetes mellitus with hyperglycemia: Secondary | ICD-10-CM

## 2020-05-24 LAB — BASIC METABOLIC PANEL
BUN: 19 mg/dL (ref 6–23)
CO2: 32 mEq/L (ref 19–32)
Calcium: 9.7 mg/dL (ref 8.4–10.5)
Chloride: 103 mEq/L (ref 96–112)
Creatinine, Ser: 1.02 mg/dL (ref 0.40–1.20)
GFR: 59.04 mL/min — ABNORMAL LOW (ref >60.00)
Glucose, Bld: 203 mg/dL — ABNORMAL HIGH (ref 70–99)
Potassium: 4.2 mEq/L (ref 3.5–5.1)
Sodium: 139 mEq/L (ref 135–145)

## 2020-05-24 LAB — HEMOGLOBIN A1C: Hgb A1c MFr Bld: 8.8 % — ABNORMAL HIGH (ref 4.6–6.5)

## 2020-05-26 NOTE — Progress Notes (Signed)
Patient ID: Destiny Beasley, female   DOB: 20-Feb-1956, 65 y.o.   MRN: [id number]   Reason for Appointment: Type II Diabetes follow-up   History of Present Illness    Date of diagnosis: 10/2008  Previous history: She had markedly increased blood sugars at diagnosis and was tried on oral hypoglycemic drugs and Victoza for about 9 months before starting an insulin.  Since 2011 she had been on basal bolus insulin regimen Previously had had difficulty controlling her diabetes mostly because of difficulty with compliance with various aspects of self-care.  Previous regimen: Toujeo 60 units at 8 am . Apidra 20 units at times  She was started on the Omnipod insulin pump on 09/11/16  Recent history:   Insulin regimen: HUMALOG U-100 10-23 units before meals, sensitivity 1: 35 Lantus insulin 70 units pm daily  Non-insulin hypoglycemic drugs: not on Rybelsus 14 mg daily  Her A1c has been consistently high, now 8.8 compared to 9.3         Current management, blood sugar patterns and problems:  She states that she was getting anxiety attacks with taking Rybelsus and stopped it over a month ago without notifying us  She has only use Lantus once a day instead of twice a day as recommended before  Freestyle libre appears to be fairly close to the lab glucose  She has been using the [place] since mid July 21 but is not using carbohydrate counting recently, not clear if she is making adjustments for Premeal blood sugar also  She is afraid of hypoglycemia so she will only take part of her insulin before the meals especially at suppertime and may take more when the blood sugars are higher later  Morning sugars are variable but no symptomatic hypoglycemia  She is not sure if her insulin pump will be covered this year   GLUCOSE patterns/CGM interpretation is as follows:  Blood sugars are variable overnight, rising modestly at breakfast, staying level in the afternoon but rising  progressively after about 5 PM until at least 11 PM  HIGHEST blood sugars are around 11 PM  Although his postprandial readings are mostly higher after dinner somewhat inconsistent  Also occasionally has higher postprandial readings after breakfast or lunch  Blood sugars OVERNIGHT are decreasing progressively and occasionally may be low normal at 6-7 AM, significant variability present   EXERCISE: Has not been doing any for at least a couple of months and only now wanting to start  Side effects from medications: Candidiasis from Invokana   CGM data for the last 2 weeks from freestyle libre  CGM use % of time  95  2-week average/GV  187/25  Time in range       41%  % Time Above 180  50  % Time above 250 9  % Time Below 70 0     PRE-MEAL Fasting Lunch Dinner Bedtime Overall  Glucose range:       Averages:  150  190  172  230  187   POST-MEAL PC Breakfast PC Lunch PC Dinner  Glucose range:     Averages:  182  183  194   Previous data:  PRE-MEAL Fasting  11 AM Dinner Bedtime Overall  Glucose range:  97-273  100-316  212  192   Mean/median:      218   POST-MEAL PC Breakfast  2-3 PM PC Dinner  Glucose range:  ,  219-244   Mean/median:  Glycemic patterns summary: Overall blood sugars are highly variable and persistently high, on an average blood sugars are above 180 target throughout the day and night   Dietician visit: Most recent: 04/2010 Weight control:  Wt Readings from Last 3 Encounters:  05/27/20 267 lb 9.6 oz (121.4 kg)  05/12/20 260 lb 6.4 oz (118.1 kg)  12/10/19 260 lb (117.9 kg)         Diabetes labs:  Lab Results  Component Value Date   HGBA1C 8.8 (H) 05/24/2020   HGBA1C 8.3 (H) 05/12/2020   HGBA1C 9.3 (H) 12/03/2019   Lab Results  Component Value Date   MICROALBUR <0.7 08/26/2019   [place] 78 12/24/2019   CREATININE 1.02 05/24/2020      No visits with results within 1 Day(s) from this visit.  Latest known visit with results is:  Lab  on 05/24/2020  Component Date Value Ref Range Status   Sodium 05/24/2020 139  135 - 145 mEq/L Final   Potassium 05/24/2020 4.2  3.5 - 5.1 mEq/L Final   Chloride 05/24/2020 103  96 - 112 mEq/L Final   CO2 05/24/2020 32  19 - 32 mEq/L Final   Glucose, Bld 05/24/2020 203* 70 - 99 mg/dL Final   BUN 05/24/2020 19  6 - 23 mg/dL Final   Creatinine, Ser 05/24/2020 1.02  0.40 - 1.20 mg/dL Final   GFR 05/24/2020 59.04* >60.00 mL/min Final   Calculated using the CKD-EPI Creatinine Equation (2021)   Calcium 05/24/2020 9.7  8.4 - 10.5 mg/dL Final   Hgb A1c MFr Bld 05/24/2020 8.8* 4.6 - 6.5 % Final   Glycemic Control Guidelines for People with Diabetes:Non Diabetic:  <6%Goal of Therapy: <7%Additional Action Suggested:  >8%      Allergies as of 05/27/2020      Reactions   Hydrocodone Other (See Comments)   Severe anxiety Confusion   Invokana [canagliflozin] Other (See Comments)   Constant yeast infections   Evolocumab    Injection site pain   Hydralazine    Fatigue   Imdur [isosorbide Nitrate] Other (See Comments)   Patient reported wheezing   Rosuvastatin    myalgias   Simvastatin    myalgias   Spironolactone    Wheezing vs fatigue?   Atorvastatin Other (See Comments)   DIZZINESS      Medication List       Accurate as of May 27, 2020  9:25 AM. If you have any questions, ask your nurse or doctor.        STOP taking these medications   Rybelsus 14 MG Tabs Generic drug: Semaglutide Replaced by: Ozempic (0.25 or 0.5 MG/DOSE) 2 MG/1.5ML Sopn Stopped by: Elayne Snare, MD     TAKE these medications   Advair HFA 115-21 MCG/ACT inhaler Generic drug: fluticasone-salmeterol Inhale 2 puffs into the lungs in the morning and at bedtime.   ALPRAZolam 0.25 MG tablet Commonly known as: XANAX Take 0.25 mg by mouth 2 (two) times daily as needed (anxiety attack).   amLODipine 10 MG tablet Commonly known as: NORVASC Take 1 tablet (10 mg total) by mouth daily.   aspirin EC 81  MG tablet Take 1 tablet (81 mg total) by mouth daily.   carvedilol 12.5 MG tablet Commonly known as: COREG Take 1.5 tablets (18.75 mg total) by mouth 2 (two) times daily.   FreeStyle Libre 2 Reader [place] Use reader to monitor blood sugars continuously.   FreeStyle Libre 2 Sensor Misc USE 1 SENSOR ONCE EVERY 14  DAYS TO  MONITOR BLOOD  SUGARS CONTINUOUSLY   HumaLOG 100 UNIT/ML cartridge Generic drug: insulin lispro ADMINISTER 50 UNITS UNDER THE SKIN EVERY MORNING AND EVERY NIGHT AT BEDTIME What changed:   how much to take  how to take this  when to take this  additional instructions   hydrochlorothiazide 25 MG tablet Commonly known as: HYDRODIURIL TAKE 1 TABLET(25 MG) BY MOUTH DAILY What changed: See the new instructions.   irbesartan 300 MG tablet Commonly known as: AVAPRO Take 300 mg by mouth daily.   Lantus SoloStar 100 UNIT/ML Solostar Pen Generic drug: insulin glargine ADMINISTER 50 UNITS UNDER THE SKIN TWICE DAILY What changed: See the new instructions.   MULTIPLE VITAMIN PO Take 1 tablet daily by mouth.   nitroGLYCERIN 0.4 MG SL tablet Commonly known as: NITROSTAT DISSOLVE ONE TABLET UNDER TONGUE AS NEEDED FOR CHEST PAIN   OmniPod Dash 5 Pack Pods Misc Use one pod every 2 days What changed:   how to take this  additional instructions  Another medication with the same name was removed. Continue taking this medication, and follow the directions you see here. Changed by: Tora Duck, [place] Kit Use to monitor blood sugar What changed:   how to take this  additional instructions  Another medication with the same name was removed. Continue taking this medication, and follow the directions you see here. Changed by: Tora Duck, CMA   Ozempic (0.25 or 0.5 MG/DOSE) 2 MG/1.5ML Sopn Generic drug: Semaglutide(0.25 or 0.5MG/DOS) Inject 0.5 mg into the skin once a week. Replaces: Rybelsus 14 MG Tabs Started by: Elayne Snare,  MD   pantoprazole 40 MG tablet Commonly known as: PROTONIX Take 40 mg by mouth daily.   Praluent 75 MG/ML [place] Generic drug: Alirocumab Inject 1 pen into the skin every 14 (fourteen) days.   terbinafine 250 MG tablet Commonly known as: LAMISIL Take 250 mg by mouth daily.   valACYclovir 1000 MG tablet Commonly known as: VALTREX Take 1,000 mg by mouth 2 (two) times daily.   Venlafaxine HCl 225 MG Tb24 Take 225 mg by mouth daily.   VITAMIN D-3 PO Take 1 tablet by mouth daily.       Allergies:  Allergies  Allergen Reactions   Hydrocodone Other (See Comments)    Severe anxiety Confusion    Invokana [Canagliflozin] Other (See Comments)    Constant yeast infections   Evolocumab     Injection site pain   Hydralazine     Fatigue   Imdur [Isosorbide Nitrate] Other (See Comments)    Patient reported wheezing   Rosuvastatin     myalgias   Simvastatin     myalgias   Spironolactone     Wheezing vs fatigue?   Atorvastatin Other (See Comments)    DIZZINESS    Past Medical History:  Diagnosis Date   Anemia    Anxiety    CAD in native artery    a. Inf STEMI 09/7339 complicated by cardiogenic shock/complete heart block s/p emergent cath showing culprit large dominant LCx s/p aspiration thrombectomy and DES, EF 50% by cath, 40-45% by echo.   Chronic systolic heart failure ([place]) 06/25/2017   Echo 11/19: mild LVH, EF 40-45, inf-lat HK, Gr 1 DD, trivial MR   Complete heart block, transient ([place])    a. 06/2017 in setting of acute inf MI -> resolved after PCI.   COVID-19    Diabetes mellitus    Goiter    History of radiation therapy 06/14/2016 -  07/19/2016   Right Breast 50 Gy 25 fractions   Hypercholesteremia    Hypertension    Ischemic cardiomyopathy    a. EF 50% by cath and 40-45% by echo 06/2017.   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   Meningioma  [facility])    Nuclear stress test    Nuclear stress test 10/19: EF 35, inferolateral, apical scar; inferior, apical inferior infarct with mild peri-infarct ischemia; high risk   Obesity    Personal history of chemotherapy    Personal history of radiation therapy    Pituitary tumor    Seasonal asthma    Sleep apnea    does not use every night   Stroke [facility])    TIA - Dec 1 st, 2017    Past Surgical History:  Procedure Laterality Date   ABDOMINAL HYSTERECTOMY     BRAIN MENINGIOMA EXCISION  2005   BREAST BIOPSY     BREAST LUMPECTOMY Right    2017   BREAST LUMPECTOMY WITH RADIOACTIVE SEED LOCALIZATION Right 04/20/2016   Procedure: RIGHT BREAST LUMPECTOMY WITH RADIOACTIVE SEED LOCALIZATION;  Surgeon: Fanny Skates, MD;  Location: [place];  Service: General;  Laterality: Right;   BREAST REDUCTION SURGERY Bilateral 10/23/2016   Procedure: BILATERAL BREAST REDUCTION WITH LIPOSUCTION ASSISTANCE;  Surgeon: Cristine Polio, MD;  Location: [place];  Service: Plastics;  Laterality: Bilateral;   CESAREAN SECTION     CORONARY STENT INTERVENTION N/A 06/25/2017   Procedure: CORONARY STENT INTERVENTION;  Surgeon: Sherren Mocha, MD;  Location: [place] CV LAB;  Service: Cardiovascular;  Laterality: N/A;   CORONARY/GRAFT ACUTE MI REVASCULARIZATION N/A 06/25/2017   Procedure: Coronary/Graft Acute MI Revascularization;  Surgeon: Sherren Mocha, MD;  Location: [place] CV LAB;  Service: Cardiovascular;  Laterality: N/A;   FOOT SURGERY Bilateral 2016   hammer toe surgery   LEFT HEART CATH AND CORONARY ANGIOGRAPHY N/A 06/25/2017   Procedure: LEFT HEART CATH AND CORONARY ANGIOGRAPHY;  Surgeon: Sherren Mocha, MD;  Location: [place] CV LAB;  Service: Cardiovascular;  Laterality: N/A;   LEFT HEART CATH AND CORONARY ANGIOGRAPHY N/A 05/13/2020   Procedure: LEFT HEART CATH AND CORONARY ANGIOGRAPHY;  Surgeon: Lorretta Harp, MD;  Location: [place]  CV LAB;  Service: Cardiovascular;  Laterality: N/A;   MENISCUS REPAIR Left 2015   PITUITARY SURGERY     REDUCTION MAMMAPLASTY     RIGHT HEART CATH N/A 06/25/2017   Procedure: RIGHT HEART CATH;  Surgeon: Sherren Mocha, MD;  Location: [place] CV LAB;  Service: Cardiovascular;  Laterality: N/A;   THYROID SURGERY      Family History  Problem Relation Age of Onset   Diabetes Mother    Hyperlipidemia Mother    Hypertension Mother     Social History:  reports that she has never smoked. She has never used smokeless tobacco. She reports current alcohol use of about 1.0 standard drink of alcohol per week. She reports that she does not use drugs.  Review of Systems:  Hypertension:  Variable control, blood pressure is managed by cardiologist with readings as follows:  She is taking Avapro and hydrochlorothiazide as also Coreg  BP Readings from Last 3 Encounters:  05/27/20 138/86  05/13/20 (!) 154/79  02/10/20 130/78     Lipids: She has been on Praluent from cardiologist, apparently could not tolerate Repatha  Followed by cardiology  She does have  a history of MI  Lab Results  Component Value Date   CHOL 144 12/24/2019   HDL 48 12/24/2019   LDLCALC 78 12/24/2019   LDLDIRECT 87.0 06/04/2019   TRIG 95 12/24/2019   CHOLHDL 3.0 12/24/2019   Lab Results  Component Value Date   ALT 24 12/24/2019    Diabetic shoe prescription has been given previously      Examination:   BP 138/86    Pulse 95    Ht 6' (1.829 m)    Wt 267 lb 9.6 oz (121.4 kg)    SpO2 98%    BMI 36.29 kg/m   Body mass index is 36.29 kg/m.     ASSESSMENT/ PLAN:    Diabetes type 2 with obesity, BMI more than 35  See history of present illness for detailed discussion of current management, interpretation of her continuous glucose monitoring record, blood sugar patterns and problems identified  Her A1c has usually been consistently high and now 8.8  As above she has difficulty controlling her  postprandial readings likely from variable diet, inadequate exercise and also inadequate mealtime insulin She does not count carbohydrates and despite having the ability to calculate her mealtime dose with her pen she usually takes smaller than needed doses for fear of hypoglycemia Also may be getting higher readings from stopping Rybelsus and has gained weight recently  She is inconsistent with her regimen She arbitrarily stops her diabetes medications and some of them are stopped because of her perceived idea that they are causing panic attacks Follow-up has been irregular   Recommendations today:    She was advised to start Lantus twice a day with 40 in the morning and 35 at dinner  She will adjust her evening dose at least based on blood sugar trend in the mornings over 3 to 5 days  She will try to take at least 20 units of Humalog at suppertime and add more at least 5 to 10 units when she is eating significant amount of carbohydrate  She will look into the coverage for the insulin pump and this was sent again  She agrees to try Ozempic has a weekly dose for convenience and discussed how this works  She will go up to 0.5 mg after 3 injections if tolerated, patient information brochure and co-pay card given  Follow-up in 3 months  LIPIDS: To be followed by cardiologist, currently on Praluent  Hypertension: Relatively better today   Patient Instructions  Take Lantus 40 in am and 35 at dinner  Take 25-30 Humalog at supper based on Carbs  Start OZEMPIC injections by dialing 0.25 mg on the pen as shown once weekly on the same day of the week.   You may inject in the sides of the stomach, outer thigh or arm as indicated in the brochure given. If you have any difficulties using the pen see the video at CompPlans.co.za  You will feel fullness of the stomach with starting the medication and should try to keep the portions at meals small.  You may experience nausea in the  first few days which usually gets better over time    After 3-4 weeks increase the dose to 0.5 mg weekly  If you have any questions or persistent side effects please call the office   You may also talk to a nurse educator with [employer] at [phone] Useful website: [email or web address]       Elayne Snare 05/27/2020, 9:25 AM   This visit occurred during the  SARS-CoV-2 public health emergency.  Safety protocols were in place, including screening questions prior to the visit, additional usage of staff PPE, and extensive cleaning of exam room while observing appropriate contact time as indicated for disinfecting solutions.

## 2020-05-27 ENCOUNTER — Other Ambulatory Visit: Payer: Self-pay

## 2020-05-27 ENCOUNTER — Other Ambulatory Visit: Payer: Self-pay | Admitting: *Deleted

## 2020-05-27 ENCOUNTER — Encounter: Payer: Self-pay | Admitting: Endocrinology

## 2020-05-27 ENCOUNTER — Ambulatory Visit: Payer: 59 | Admitting: Endocrinology

## 2020-05-27 VITALS — BP 138/86 | HR 95 | Ht 72.0 in | Wt 267.6 lb

## 2020-05-27 DIAGNOSIS — Z794 Long term (current) use of insulin: Secondary | ICD-10-CM | POA: Diagnosis not present

## 2020-05-27 DIAGNOSIS — E1165 Type 2 diabetes mellitus with hyperglycemia: Secondary | ICD-10-CM | POA: Diagnosis not present

## 2020-05-27 DIAGNOSIS — I10 Essential (primary) hypertension: Secondary | ICD-10-CM | POA: Diagnosis not present

## 2020-05-27 MED ORDER — OMNIPOD DASH PODS (GEN 4) MISC: 3 refills | Status: DC

## 2020-05-27 MED ORDER — OZEMPIC (0.25 OR 0.5 MG/DOSE) 2 MG/1.5ML ~~LOC~~ SOPN: 0.5000 mg | PEN_INJECTOR | SUBCUTANEOUS | 2 refills | Status: DC

## 2020-05-27 MED ORDER — OMNIPOD DASH PDM (GEN 4) KIT: PACK | 0 refills | Status: DC

## 2020-05-27 NOTE — Patient Instructions (Addendum)
Take Lantus 40 in am and 35 at dinner  Take 25-30 Humalog at supper based on Carbs  Start OZEMPIC injections by dialing 0.25 mg on the pen as shown once weekly on the same day of the week.   You may inject in the sides of the stomach, outer thigh or arm as indicated in the brochure given. If you have any difficulties using the pen see the video at [employer].za  You will feel fullness of the stomach with starting the medication and should try to keep the portions at meals small.  You may experience nausea in the first few days which usually gets better over time    After 3-4 weeks increase the dose to 0.5 mg weekly  If you have any questions or persistent side effects please call the office   You may also talk to a nurse educator with [employer] at [phone] Useful website: [email or web address]

## 2020-05-31 ENCOUNTER — Other Ambulatory Visit: Payer: Self-pay | Admitting: *Deleted

## 2020-05-31 ENCOUNTER — Other Ambulatory Visit: Payer: Self-pay | Admitting: Obstetrics and Gynecology

## 2020-05-31 DIAGNOSIS — Z1231 Encounter for screening mammogram for malignant neoplasm of breast: Secondary | ICD-10-CM

## 2020-05-31 MED ORDER — OMNIPOD DASH PDM (GEN 4) KIT: PACK | 0 refills | Status: DC

## 2020-05-31 MED ORDER — OMNIPOD DASH PODS (GEN 4) MISC: 3 refills | Status: DC

## 2020-06-02 NOTE — Telephone Encounter (Signed)
Increase Carvedilol 18.75 mg twice daily  F/u with HTN clinic in 2-3 weeks. Richardson Dopp, PA-C    06/02/2020 5:18 PM

## 2020-06-04 ENCOUNTER — Other Ambulatory Visit: Payer: Self-pay

## 2020-06-04 MED ORDER — CARVEDILOL 25 MG PO TABS: 25.0000 mg | ORAL_TABLET | Freq: Two times a day (BID) | ORAL | 3 refills | Status: DC

## 2020-06-04 NOTE — Addendum Note (Signed)
Addended by: Thora Lance on: 06/04/2020 05:12 PM   Modules accepted: Orders

## 2020-06-07 ENCOUNTER — Other Ambulatory Visit: Payer: Self-pay | Admitting: *Deleted

## 2020-06-07 MED ORDER — OZEMPIC (0.25 OR 0.5 MG/DOSE) 2 MG/1.5ML ~~LOC~~ SOPN: 0.5000 mg | PEN_INJECTOR | SUBCUTANEOUS | 2 refills | Status: DC

## 2020-06-07 NOTE — Telephone Encounter (Signed)
Options for BP control are becoming limited as she has multiple drug intolerances. We may have to use clonidine if BP remains uncontrolled. PLAN:  1. Decrease Carvedilol back to 18.75 mg twice daily. 2. Send low Na diet 3. Check BP at home twice daily and record for review at next appt. 4. Arrange appt with HTN clinic in next 1-2 weeks.  Bring list of BP readings to appt to review. 5. Bring BP cuff to appt to make sure cuff is accurate. Richardson Dopp, PA-C    06/07/2020 8:24 AM

## 2020-06-10 ENCOUNTER — Ambulatory Visit: Payer: 59

## 2020-06-10 ENCOUNTER — Other Ambulatory Visit: Payer: Self-pay

## 2020-06-10 ENCOUNTER — Other Ambulatory Visit: Payer: Self-pay | Admitting: Endocrinology

## 2020-06-10 MED ORDER — FREESTYLE TEST VI STRP: ORAL_STRIP | 12 refills | Status: AC

## 2020-06-17 ENCOUNTER — Other Ambulatory Visit: Payer: Self-pay | Admitting: Obstetrics and Gynecology

## 2020-06-17 DIAGNOSIS — Z853 Personal history of malignant neoplasm of breast: Secondary | ICD-10-CM

## 2020-06-22 ENCOUNTER — Ambulatory Visit
Admission: RE | Admit: 2020-06-22 | Discharge: 2020-06-22 | Disposition: A | Discharge status: Home / Self Care | Payer: 59 | Source: Ambulatory Visit | Attending: Obstetrics and Gynecology | Admitting: Obstetrics and Gynecology

## 2020-06-22 ENCOUNTER — Other Ambulatory Visit: Payer: Self-pay

## 2020-06-22 DIAGNOSIS — Z853 Personal history of malignant neoplasm of breast: Secondary | ICD-10-CM

## 2020-06-23 ENCOUNTER — Other Ambulatory Visit: Payer: 59 | Admitting: *Deleted

## 2020-06-23 ENCOUNTER — Telehealth: Payer: Self-pay | Admitting: *Deleted

## 2020-06-23 ENCOUNTER — Ambulatory Visit (INDEPENDENT_AMBULATORY_CARE_PROVIDER_SITE_OTHER): Payer: 59 | Admitting: Pharmacist

## 2020-06-23 VITALS — BP 150/92 | HR 98

## 2020-06-23 DIAGNOSIS — I10 Essential (primary) hypertension: Secondary | ICD-10-CM | POA: Diagnosis not present

## 2020-06-23 DIAGNOSIS — E7800 Pure hypercholesterolemia, unspecified: Secondary | ICD-10-CM

## 2020-06-23 LAB — LIPID PANEL
Chol/HDL Ratio: 3 ratio (ref 0.0–4.4)
Cholesterol, Total: 158 mg/dL (ref 100–199)
HDL: 53 mg/dL (ref >39)
LDL Chol Calc (NIH): 90 mg/dL (ref 0–99)
Triglycerides: 81 mg/dL (ref 0–149)
VLDL Cholesterol Cal: 15 mg/dL (ref 5–40)

## 2020-06-23 LAB — HEPATIC FUNCTION PANEL
ALT: 25 IU/L (ref 0–32)
AST: 24 IU/L (ref 0–40)
Albumin: 4.4 g/dL (ref 3.8–4.8)
Alkaline Phosphatase: 107 IU/L (ref 44–121)
Bilirubin Total: 0.2 mg/dL (ref 0.0–1.2)
Bilirubin, Direct: <0.1 mg/dL (ref 0.00–0.40)
Total Protein: 7.6 g/dL (ref 6.0–8.5)

## 2020-06-23 MED ORDER — CHLORTHALIDONE 25 MG PO TABS: 12.5000 mg | ORAL_TABLET | Freq: Every day | ORAL | 3 refills | Status: DC

## 2020-06-23 MED ORDER — CARVEDILOL 12.5 MG PO TABS: 18.5000 mg | ORAL_TABLET | Freq: Two times a day (BID) | ORAL | 3 refills | Status: DC

## 2020-06-23 NOTE — Telephone Encounter (Signed)
Start PA--Ozempic 2mg /1.5 ml Pen-injector.

## 2020-06-23 NOTE — Patient Instructions (Addendum)
Decrease carvedilol to 18.75mg  twice a day  STOP taking HCTZ  Start taking chlorthalidone 12.5mg  daily (1/2 tablet) daily  Continue irbesartan 300mg  daily  Call me at [phone] with any questions  Talk with PCP about seeing a therapist/counselor

## 2020-06-23 NOTE — Progress Notes (Signed)
Patient ID: Destiny Beasley                 DOB: 03/02/1956                      MRN: [id number]      HPI: Destiny Beasley is a 65 y.o. female patient of Dr. Burt Knack referred by Richardson Dopp, PA to HTN clinic. PMH is significant forCAD s/p STEMI in 5056, systolic CHF w/ last EF 9/79 recovered to 40-45%, T2DM, prior TIA, HTN and HLD. Pt has previously followed with PharmD team for cholesterol management for which she currently takes Praluent injections.   She was last seen in HTN clinic in Oct 2021. No medication changes were made at that appointment because blood pressure was at goal. She no showed to her follow up and canceled the rescheduled visit. In Feb she contacted the office with blood pressure readings in the 150-170/90-105. Her carvedilol was increased to 50m but it caused her to wheeze, so it was decreased back to 18.542mand patient referred back to HTN clinic.  Patient presents today to HTN clinic for follow up. She immediately states that she doesn't feel well. Has pain in her side and is going to visit her other MD after this. I offered to reschedule apt but patient wished to continue with appointment. She has a hx of anxiety. She had what appeared to be a mini anxiety attack in office, where she started breathing faster, she took her inhaler and 1/2 of a xanax and quickly returned to baseline.   Denies dizziness, lightheadedness or swelling at home. Reports home BP is 150-160/90-102 but does not bring any actual readings. She is still taking carvedilol 2583mID and now reports that its making her legs hurt. She is not taking amlodipine because she says it makes her legs hurt. Reports improvement after she stopped it.    Current HTN meds: carvedilol 18.75 mg BID, HCTZ 25 mg daily, irbesartan 300 mg daily Previously tried: spironolactone 25 mg daily (wheezing vs. fatigue), hydralazine 25, 50 mg TID (fatigue), Imdur 30 mg daily (wheezing), Entresto (?), Invokana (frequent yeast  infections), carvedilol 42m55mD (wheezing), amlodipine 10 mg daily (makes her muscles hurt) BP goal: <130/80 mmHg  Family History:The patient'sfamily history includes Diabetes in her mother; Hyperlipidemia in her mother; Hypertension in her mother.  Social History:never smoked, + ETOH  Diet: not addressed this visit Exercise: not addressed this visit  Home BP readings: 150-160/90-102  Wt Readings from Last 3 Encounters:  05/27/20 267 lb 9.6 oz (121.4 kg)  05/12/20 260 lb 6.4 oz (118.1 kg)  12/10/19 260 lb (117.9 kg)   BP Readings from Last 3 Encounters:  05/27/20 138/86  05/13/20 (!) 154/79  02/10/20 130/78   Pulse Readings from Last 3 Encounters:  05/27/20 95  05/13/20 81  02/10/20 90    Renal function: CrCl cannot be calculated (Patient's most recent lab result is older than the maximum 21 days allowed.).  Past Medical History:  Diagnosis Date  . Anemia   . Anxiety   . CAD in native artery    a. Inf STEMI 3/204/8016plicated by cardiogenic shock/complete heart block s/p emergent cath showing culprit large dominant LCx s/p aspiration thrombectomy and DES, EF 50% by cath, 40-45% by echo.  . Chronic systolic heart failure (HCC)[place]4/2019   Echo 11/19: mild LVH, EF 40-45, inf-lat HK, Gr 1 DD, trivial MR  . Complete heart block, transient (HCC)[place]  a. 06/2017 in setting of acute inf MI -> resolved after PCI.  Marland Kitchen COVID-19   . Diabetes mellitus   . Goiter   . History of radiation therapy 06/14/2016 - 07/19/2016   Right Breast 50 Gy 25 fractions  . Hypercholesteremia   . Hypertension   . Ischemic cardiomyopathy    a. EF 50% by cath and 40-45% by echo 06/2017.  . Leg pain    ABIs 07/2019: Normal (R 1.27; L 1.25)  . 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 Meningioma ([place])   . Nuclear stress test    Nuclear stress test 10/19: EF 35, inferolateral, apical scar; inferior, apical inferior infarct with mild peri-infarct ischemia;  high risk  . Obesity   . Personal history of chemotherapy   . Personal history of radiation therapy   . Pituitary tumor   . Seasonal asthma   . Sleep apnea    does not use every night  . Stroke [facility])    TIA - Dec 1 st, 2017    Current Outpatient Medications on File Prior to Visit  Medication Sig Dispense Refill  . ADVAIR HFA 115-21 MCG/ACT inhaler Inhale 2 puffs into the lungs in the morning and at bedtime.    . Alirocumab (PRALUENT) 75 MG/ML SOAJ Inject 1 pen into the skin every 14 (fourteen) days. 2 pen 11  . ALPRAZolam (XANAX) 0.25 MG tablet Take 0.25 mg by mouth 2 (two) times daily as needed (anxiety attack).    Marland Kitchen amLODipine (NORVASC) 10 MG tablet Take 1 tablet (10 mg total) by mouth daily. 90 tablet 3  . aspirin EC 81 MG tablet Take 1 tablet (81 mg total) by mouth daily. 90 tablet 3  . carvedilol (COREG) 25 MG tablet Take 1 tablet (25 mg total) by mouth 2 (two) times daily. 180 tablet 3  . Cholecalciferol (VITAMIN D-3 PO) Take 1 tablet by mouth daily.     . Continuous Blood Gluc Receiver (FREESTYLE LIBRE 2 READER) DEVI Use reader to monitor blood sugars continuously. 1 each 0  . Continuous Blood Gluc Sensor (FREESTYLE LIBRE 2 SENSOR) MISC USE 1 SENSOR ONCE EVERY 14  DAYS TO MONITOR BLOOD  SUGARS CONTINUOUSLY 5 each 1  . glucose blood (FREESTYLE TEST STRIPS) test strip Use as instructed 100 each 12  . hydrochlorothiazide (HYDRODIURIL) 25 MG tablet TAKE 1 TABLET(25 MG) BY MOUTH DAILY (Patient taking differently: Take 25 mg by mouth daily.) 30 tablet 3  . INPEN 100-BLUE-LILLY DEVI CONSULT MD FOR APP SETTINGS. 1 each 0  . Insulin Disposable Pump (OMNIPOD DASH 5 PACK PODS) MISC Use one pod every 2 days 15 each 3  . Insulin Disposable Pump (OMNIPOD DASH SYSTEM) KIT Use to monitor blood sugar 1 kit 0  . insulin lispro (HUMALOG) 100 UNIT/ML cartridge ADMINISTER 50 UNITS UNDER THE SKIN EVERY MORNING AND EVERY NIGHT AT BEDTIME (Patient taking differently: Inject 3-25 Units into the skin 3  (three) times daily with meals. Per sliding scale.) 30 mL 1  . irbesartan (AVAPRO) 300 MG tablet Take 300 mg by mouth daily.    Marland Kitchen LANTUS SOLOSTAR 100 UNIT/ML Solostar Pen ADMINISTER 50 UNITS UNDER THE SKIN TWICE DAILY (Patient taking differently: Inject 70 Units into the skin daily.) 30 mL 1  . MULTIPLE VITAMIN PO Take 1 tablet daily by mouth.    . nitroGLYCERIN (NITROSTAT) 0.4 MG SL tablet DISSOLVE ONE TABLET UNDER TONGUE AS NEEDED FOR CHEST PAIN 25 tablet 4  . pantoprazole (PROTONIX)  40 MG tablet Take 40 mg by mouth daily.    . Semaglutide,0.25 or 0.5MG/DOS, (OZEMPIC, 0.25 OR 0.5 MG/DOSE,) 2 MG/1.5ML SOPN Inject 0.5 mg into the skin once a week. 2 mL 2  . terbinafine (LAMISIL) 250 MG tablet Take 250 mg by mouth daily. (Patient not taking: Reported on 05/27/2020)    . valACYclovir (VALTREX) 1000 MG tablet Take 1,000 mg by mouth 2 (two) times daily.    . Venlafaxine HCl 225 MG TB24 Take 225 mg by mouth daily.     No current facility-administered medications on file prior to visit.    Allergies  Allergen Reactions  . Hydrocodone Other (See Comments)    Severe anxiety Confusion   . Invokana [Canagliflozin] Other (See Comments)    Constant yeast infections  . Evolocumab     Injection site pain  . Hydralazine     Fatigue  . Imdur [Isosorbide Nitrate] Other (See Comments)    Patient reported wheezing  . Rosuvastatin     myalgias  . Simvastatin     myalgias  . Spironolactone     Wheezing vs fatigue?  Marland Kitchen Atorvastatin Other (See Comments)    DIZZINESS    There were no vitals taken for this visit.   Assessment/Plan:  1. Hypertension - Blood pressure is above goal of <130/80 in clinic today and at home per pt report. She stopped amlodipine due to leg pain. Reports it improved after stopping, but now saying the carvedilol is causing leg pain. I've asked patient to decrease carvedilol to 18.63m BID. Due to multiple drug intolerances, we are very limited on medication options. Will STOP  HCTZ and replace it with chlorthalidone 12.590mdaily. Follow up in clinic in 2 weeks. Patient encouraged to speak to her PCP about finding a therapist to help her with her anxiety. I believe a lot of her blood pressure issues and medication intolerances are anxiety driven.  Patients technique with inhaler was incorrect. Reviewed proper inhaler technique with patient. I also called and LVM on pharmacy line to d/c HCTZ and carvedilol 2525mo avoid any confusion  Thank you,  MelRamond Dialharm.D, BCPS, CPP [id number] [id number]: [phone]: [phone]

## 2020-06-23 NOTE — Telephone Encounter (Signed)
ozempic 2mg /1.5 ml--aproved (medscover).

## 2020-06-24 MED ORDER — PRALUENT 150 MG/ML ~~LOC~~ SOAJ: 1.0000 "pen " | SUBCUTANEOUS | 11 refills | Status: DC

## 2020-06-27 ENCOUNTER — Other Ambulatory Visit: Payer: Self-pay | Admitting: Endocrinology

## 2020-07-02 ENCOUNTER — Telehealth: Payer: Self-pay | Admitting: *Deleted

## 2020-07-02 DIAGNOSIS — E7800 Pure hypercholesterolemia, unspecified: Secondary | ICD-10-CM

## 2020-07-02 NOTE — Telephone Encounter (Signed)
-----   Message from Liliane Shi, [place] sent at 06/23/2020  9:42 PM EST ----- LDL above goal.  LFTs normal.  PLAN:   - Increase Alirocumab (Praluent) to 150 mg every 2 weeks.  - Lipids and LFTs in 3 mos.  Richardson Dopp, PA-C    06/23/2020 9:26 PM

## 2020-07-02 NOTE — Telephone Encounter (Signed)
Pt has been notified of lab results and recommendations to increase Praulent to 150 mg. Pt aware Rx has been sent in. Pt has been scheduled for repeat labs 10/06/20 FLP/LFT, pt aware fasting. Patient notified of result.  Please refer to phone note from today for complete details.   Julaine Hua, [facility] 07/02/2020 9:45 AM

## 2020-07-05 ENCOUNTER — Other Ambulatory Visit: Payer: Self-pay | Admitting: Endocrinology

## 2020-07-08 ENCOUNTER — Ambulatory Visit: Payer: 59

## 2020-07-08 NOTE — Progress Notes (Deleted)
Patient ID: Destiny Beasley                 DOB: 1955/07/09                      MRN: [id number]      HPI: Destiny Beasley is a 65 y.o. female patient of Dr. Burt Knack referred by Richardson Dopp, PA to HTN clinic. PMH is significant forCAD s/p STEMI in 7829, systolic CHF w/ last EF 5/62 recovered to 40-45%, T2DM, prior TIA, HTN and HLD. Pt has previously followed with PharmD team for cholesterol management for which she currently takes Praluent injections.   She was last seen in HTN clinic on 06/23/20. Blood pressure was elevated. Due to multiple drug intolerances, HCTZ was changed to chlorthalidone 12.5mg  daily. It appears her blood pressure is if affected greatly by her anxiety. She was encouraged to speak with PCP about a referral to a mental health provider.   Dizziness, lightheadedness, headache, blurred vision, SOB, swelling NSAIDS, decongestants Exercise Home BP Increase chlorthalidone Hydralazine 12.5? Talk to PCP?   Current HTN meds: carvedilol 18.75 mg BID, chlorthalidone 12.5mg  daily, irbesartan 300 mg daily Previously tried: spironolactone 25 mg daily (wheezing vs. fatigue), hydralazine 25, 50 mg TID (fatigue), Imdur 30 mg daily (wheezing), Entresto (?), Invokana (frequent yeast infections), carvedilol 25mg  BID (wheezing), amlodipine 10 mg daily (makes her muscles hurt) BP goal: <130/80 mmHg  Family History:The patient'sfamily history includes Diabetes in her mother; Hyperlipidemia in her mother; Hypertension in her mother.  Social History:never smoked, + ETOH  Diet: not addressed this visit Exercise: not addressed this visit  Home BP readings:   Wt Readings from Last 3 Encounters:  05/27/20 267 lb 9.6 oz (121.4 kg)  05/12/20 260 lb 6.4 oz (118.1 kg)  12/10/19 260 lb (117.9 kg)   BP Readings from Last 3 Encounters:  06/23/20 (!) 150/92  05/27/20 138/86  05/13/20 (!) 154/79   Pulse Readings from Last 3 Encounters:  06/23/20 98  05/27/20 95  05/13/20 81     Renal function: CrCl cannot be calculated (Patient's most recent lab result is older than the maximum 21 days allowed.).  Past Medical History:  Diagnosis Date  . Anemia   . Anxiety   . CAD in native artery    a. Inf STEMI 04/3084 complicated by cardiogenic shock/complete heart block s/p emergent cath showing culprit large dominant LCx s/p aspiration thrombectomy and DES, EF 50% by cath, 40-45% by echo.  . Chronic systolic heart failure ([place]) 06/25/2017   Echo 11/19: mild LVH, EF 40-45, inf-lat HK, Gr 1 DD, trivial MR  . Complete heart block, transient ([place])    a. 06/2017 in setting of acute inf MI -> resolved after PCI.  Marland Kitchen COVID-19   . Diabetes mellitus   . Goiter   . History of radiation therapy 06/14/2016 - 07/19/2016   Right Breast 50 Gy 25 fractions  . Hypercholesteremia   . Hypertension   . Ischemic cardiomyopathy    a. EF 50% by cath and 40-45% by echo 06/2017.  . Leg pain    ABIs 07/2019: Normal (R 1.27; L 1.25)  . Malignant neoplasm of upper-outer quadrant of right female breast ([place]) 02/24/2016  . Meningioma ([place])   . Nuclear stress test    Nuclear stress test 10/19: EF 35, inferolateral, apical scar; inferior, apical inferior infarct with mild peri-infarct ischemia; high risk  . Obesity   . Personal history of chemotherapy   . Personal  history of radiation therapy   . Pituitary tumor   . Seasonal asthma   . Sleep apnea    does not use every night  . Stroke [facility])    TIA - Dec 1 st, 2017    Current Outpatient Medications on File Prior to Visit  Medication Sig Dispense Refill  . ADVAIR HFA 115-21 MCG/ACT inhaler Inhale 2 puffs into the lungs in the morning and at bedtime.    . Alirocumab (PRALUENT) 150 MG/ML SOAJ Inject 1 pen into the skin every 14 (fourteen) days. 2 mL 11  . ALPRAZolam (XANAX) 0.25 MG tablet Take 0.25 mg by mouth 2 (two) times daily as needed (anxiety attack).    Marland Kitchen aspirin EC 81 MG tablet Take 1 tablet (81 mg total) by mouth daily. 90 tablet 3  .  carvedilol (COREG) 12.5 MG tablet Take 1.5 tablets (18.75 mg total) by mouth 2 (two) times daily. 270 tablet 3  . chlorthalidone (HYGROTON) 25 MG tablet Take 0.5 tablets (12.5 mg total) by mouth daily. 45 tablet 3  . Cholecalciferol (VITAMIN D-3 PO) Take 1 tablet by mouth daily.     . Continuous Blood Gluc Receiver (FREESTYLE LIBRE 2 READER) DEVI Use reader to monitor blood sugars continuously. 1 each 0  . Continuous Blood Gluc Sensor (FREESTYLE LIBRE 2 SENSOR) MISC USE 1 SENSOR ONCE EVERY 14  DAYS TO MONITOR BLOOD  SUGARS CONTINUOUSLY 5 each 1  . glucose blood (FREESTYLE TEST STRIPS) test strip Use as instructed 100 each 12  . INPEN 100-BLUE-LILLY DEVI CONSULT MD FOR APP SETTINGS. 1 each 0  . Insulin Disposable Pump (OMNIPOD DASH 5 PACK PODS) MISC Use one pod every 2 days 15 each 3  . Insulin Disposable Pump (OMNIPOD DASH SYSTEM) KIT Use to monitor blood sugar 1 kit 0  . insulin glargine (LANTUS SOLOSTAR) 100 UNIT/ML Solostar Pen Inject 70 Units into the skin daily. 30 mL 3  . insulin lispro (HUMALOG) 100 UNIT/ML cartridge Inject 0.03-0.25 mLs (3-25 Units total) into the skin 3 (three) times daily with meals. Per sliding scale. 30 mL 3  . irbesartan (AVAPRO) 300 MG tablet Take 300 mg by mouth daily.    . MULTIPLE VITAMIN PO Take 1 tablet daily by mouth.    . nitroGLYCERIN (NITROSTAT) 0.4 MG SL tablet DISSOLVE ONE TABLET UNDER TONGUE AS NEEDED FOR CHEST PAIN 25 tablet 4  . pantoprazole (PROTONIX) 40 MG tablet Take 40 mg by mouth daily.    . Semaglutide,0.25 or 0.5MG /DOS, (OZEMPIC, 0.25 OR 0.5 MG/DOSE,) 2 MG/1.5ML SOPN Inject 0.5 mg into the skin once a week. 2 mL 2  . terbinafine (LAMISIL) 250 MG tablet Take 250 mg by mouth daily. (Patient not taking: Reported on 05/27/2020)    . valACYclovir (VALTREX) 1000 MG tablet Take 1,000 mg by mouth 2 (two) times daily.    . Venlafaxine HCl 225 MG TB24 Take 225 mg by mouth daily.     No current facility-administered medications on file prior to visit.     Allergies  Allergen Reactions  . Hydrocodone Other (See Comments)    Severe anxiety Confusion   . Invokana [Canagliflozin] Other (See Comments)    Constant yeast infections  . Evolocumab     Injection site pain  . Hydralazine     Fatigue  . Imdur [Isosorbide Nitrate] Other (See Comments)    Patient reported wheezing  . Rosuvastatin     myalgias  . Simvastatin     myalgias  . Spironolactone  Wheezing vs fatigue?  Marland Kitchen Atorvastatin Other (See Comments)    DIZZINESS    There were no vitals taken for this visit.   Assessment/Plan:  1. Hypertension - Blood pressure is above goal of <130/80 in clinic today and at home per pt report. She stopped amlodipine due to leg pain. Reports it improved after stopping, but now saying the carvedilol is causing leg pain. I've asked patient to decrease carvedilol to 18.75mg  BID. Due to multiple drug intolerances, we are very limited on medication options. Will STOP HCTZ and replace it with chlorthalidone 12.5mg  daily. Follow up in clinic in 2 weeks. Patient encouraged to speak to her PCP about finding a therapist to help her with her anxiety. I believe a lot of her blood pressure issues and medication intolerances are anxiety driven.  Patients technique with inhaler was incorrect. Reviewed proper inhaler technique with patient. I also called and LVM on pharmacy line to d/c HCTZ and carvedilol 25mg  to avoid any confusion  Thank you,  Ramond Dial, Pharm.D, BCPS, CPP [place]  [address] [place], [place] [address]  Phone: [phone]; Fax: [phone]

## 2020-07-15 ENCOUNTER — Other Ambulatory Visit: Payer: Self-pay

## 2020-07-15 ENCOUNTER — Ambulatory Visit (INDEPENDENT_AMBULATORY_CARE_PROVIDER_SITE_OTHER): Payer: 59 | Admitting: Pharmacist

## 2020-07-15 VITALS — BP 154/80 | HR 92

## 2020-07-15 DIAGNOSIS — I10 Essential (primary) hypertension: Secondary | ICD-10-CM

## 2020-07-15 DIAGNOSIS — E785 Hyperlipidemia, unspecified: Secondary | ICD-10-CM | POA: Diagnosis not present

## 2020-07-15 NOTE — Patient Instructions (Addendum)
Decrease coffee intake- switch to half caff and limit to 3 cups Great work on exercise Try to increase as able to 30 min, 5 days a week  Limit Gatorade as it is very high in sodium Keep sodium to no more than 2300mg  per day  I will call you tomorrow with your lab results. If everything looks good we will plan to increase to 25mg  of chlorthalidone.  Will call tomorrow to finalize plan.

## 2020-07-15 NOTE — Progress Notes (Signed)
Patient ID: Destiny Beasley                 DOB: 02/13/1956                      MRN: [id number]      HPI: Destiny Beasley is a 65 y.o. female patient of Dr. Burt Knack referred by Richardson Dopp, PA to HTN clinic. PMH is significant forCAD s/p STEMI in 8916, systolic CHF w/ last EF 9/45 recovered to 40-45%, T2DM, prior TIA, HTN and HLD. Pt has previously followed with PharmD team for cholesterol management for which she currently takes Praluent injections.   She was last seen in HTN clinic on 06/23/20. Blood pressure was elevated. Due to multiple drug intolerances, HCTZ was changed to chlorthalidone 12.35m daily. It appears her blood pressure is if affected greatly by her anxiety. She was encouraged to speak with PCP about a referral to a mental health provider.  Patient presents today for follow up. She denies dizziness, lightheadedness or blurred vision. Reports some headaches, thinks it from her meds. Does not take NSAIDs but does take sudafed sometimes for sinus headache. Usually only takes one dose. She has been exercising. Walking on treadmill for 20 min,5 times a week. Did talk to PCP and she reports he referred her to someone for her anxiety.   Current HTN meds: carvedilol 18.75 mg BID, chlorthalidone 12.532mdaily, irbesartan 300 mg daily Previously tried: spironolactone 25 mg daily (wheezing vs. fatigue), hydralazine 25, 50 mg TID (fatigue), Imdur 30 mg daily (wheezing), Entresto (?), Invokana (frequent yeast infections), carvedilol 2559mID (wheezing), amlodipine 10 mg daily (makes her muscles hurt) BP goal: <130/80 mmHg  Family History:The patient'sfamily history includes Diabetes in her mother; Hyperlipidemia in her mother; Hypertension in her mother.  Social History:never smoked, + ETOH  Diet: drinks: water, coffee (4-5 cups), Gatorade zero Fresh or frozen vegetables  Exercise: 20 min walking on treadmill 5 times a week  Home BP readings:  144/86,134/95,163/89,142/80,148/74,162/93,  Wt Readings from Last 3 Encounters:  05/27/20 267 lb 9.6 oz (121.4 kg)  05/12/20 260 lb 6.4 oz (118.1 kg)  12/10/19 260 lb (117.9 kg)   BP Readings from Last 3 Encounters:  06/23/20 (!) 150/92  05/27/20 138/86  05/13/20 (!) 154/79   Pulse Readings from Last 3 Encounters:  06/23/20 98  05/27/20 95  05/13/20 81    Renal function: CrCl cannot be calculated (Patient's most recent lab result is older than the maximum 21 days allowed.).  Past Medical History:  Diagnosis Date  . Anemia   . Anxiety   . CAD in native artery    a. Inf STEMI 3/20/3888mplicated by cardiogenic shock/complete heart block s/p emergent cath showing culprit large dominant LCx s/p aspiration thrombectomy and DES, EF 50% by cath, 40-45% by echo.  . [place] systolic heart [place]07[place]   Echo 11/19: mild LVH, EF 40-45, inf-lat HK, Gr 1 DD, trivial MR  . Complete heart block, transient ([place]  [place]. 06/2017 in setting of acute inf MI -> resolved after PCI.  . CMarland KitchenVID-19   . Diabetes mellitus   . Goiter   . History of radiation therapy 06/14/2016 - 07/19/2016   Right Breast 50 Gy 25 fractions  . Hypercholesteremia   . Hypertension   . Ischemic cardiomyopathy    a. EF 50% by cath and 40-45% by echo 06/2017.  . Leg pain    ABIs 07/2019: Normal (R 1.27; L  1.25)  . Malignant neoplasm of upper-outer quadrant of right female breast ([place]) 02/24/2016  . Meningioma ([place])   . Nuclear stress test    Nuclear stress test 10/19: EF 35, inferolateral, apical scar; inferior, apical inferior infarct with mild peri-infarct ischemia; high risk  . Obesity   . Personal history of chemotherapy   . Personal history of radiation therapy   . Pituitary tumor   . Seasonal asthma   . Sleep apnea    does not use every night  . Stroke [facility])    TIA - Dec 1 st, 2017    Current Outpatient Medications on File Prior to Visit  Medication Sig Dispense Refill  . ADVAIR HFA 115-21 MCG/ACT  inhaler Inhale 2 puffs into the lungs in the morning and at bedtime.    . Alirocumab (PRALUENT) 150 MG/ML SOAJ Inject 1 pen into the skin every 14 (fourteen) days. 2 mL 11  . ALPRAZolam (XANAX) 0.25 MG tablet Take 0.25 mg by mouth 2 (two) times daily as needed (anxiety attack).    Marland Kitchen aspirin EC 81 MG tablet Take 1 tablet (81 mg total) by mouth daily. 90 tablet 3  . carvedilol (COREG) 12.5 MG tablet Take 1.5 tablets (18.75 mg total) by mouth 2 (two) times daily. 270 tablet 3  . chlorthalidone (HYGROTON) 25 MG tablet Take 0.5 tablets (12.5 mg total) by mouth daily. 45 tablet 3  . Cholecalciferol (VITAMIN D-3 PO) Take 1 tablet by mouth daily.     . Continuous Blood Gluc Receiver (FREESTYLE LIBRE 2 READER) DEVI Use reader to monitor blood sugars continuously. 1 each 0  . Continuous Blood Gluc Sensor (FREESTYLE LIBRE 2 SENSOR) MISC USE 1 SENSOR ONCE EVERY 14  DAYS TO MONITOR BLOOD  SUGARS CONTINUOUSLY 5 each 1  . glucose blood (FREESTYLE TEST STRIPS) test strip Use as instructed 100 each 12  . INPEN 100-BLUE-LILLY DEVI CONSULT MD FOR APP SETTINGS. 1 each 0  . Insulin Disposable Pump (OMNIPOD DASH 5 PACK PODS) MISC Use one pod every 2 days 15 each 3  . Insulin Disposable Pump (OMNIPOD DASH SYSTEM) KIT Use to monitor blood sugar 1 kit 0  . insulin glargine (LANTUS SOLOSTAR) 100 UNIT/ML Solostar Pen Inject 70 Units into the skin daily. 30 mL 3  . insulin lispro (HUMALOG) 100 UNIT/ML cartridge Inject 0.03-0.25 mLs (3-25 Units total) into the skin 3 (three) times daily with meals. Per sliding scale. 30 mL 3  . irbesartan (AVAPRO) 300 MG tablet Take 300 mg by mouth daily.    . MULTIPLE VITAMIN PO Take 1 tablet daily by mouth.    . nitroGLYCERIN (NITROSTAT) 0.4 MG SL tablet DISSOLVE ONE TABLET UNDER TONGUE AS NEEDED FOR CHEST PAIN 25 tablet 4  . pantoprazole (PROTONIX) 40 MG tablet Take 40 mg by mouth daily.    . Semaglutide,0.25 or 0.5MG/DOS, (OZEMPIC, 0.25 OR 0.5 MG/DOSE,) 2 MG/1.5ML SOPN Inject 0.5 mg into  the skin once a week. 2 mL 2  . terbinafine (LAMISIL) 250 MG tablet Take 250 mg by mouth daily. (Patient not taking: Reported on 05/27/2020)    . valACYclovir (VALTREX) 1000 MG tablet Take 1,000 mg by mouth 2 (two) times daily.    . Venlafaxine HCl 225 MG TB24 Take 225 mg by mouth daily.     No current facility-administered medications on file prior to visit.    Allergies  Allergen Reactions  . Hydrocodone Other (See Comments)    Severe anxiety Confusion   . Invokana [Canagliflozin] Other (See Comments)  Constant yeast infections  . Evolocumab     Injection site pain  . Hydralazine     Fatigue  . Imdur [Isosorbide Nitrate] Other (See Comments)    Patient reported wheezing  . Rosuvastatin     myalgias  . Simvastatin     myalgias  . Spironolactone     Wheezing vs fatigue?  Marland Kitchen Atorvastatin Other (See Comments)    DIZZINESS    There were no vitals taken for this visit.   Assessment/Plan:  1. Hypertension - Blood pressure is above goal of <130/80 in clinic today. Home readings a little better. Reports having an upper arm cuff. Will drawn BMP today. If stable, will plan to increase chlorthalidone to 72m daily. Continue carvedilol 18.75 mg BID and irbesartan 300 mg daily. I have asked her to decrease her coffee intake. Talked about changing to 1/2 caf. Warned against sodium content in Gatorade. Encouraged her to continue to exercise and increase length as able. Will call her tomorrow to finalize medication plan. Follow up in clinic in 3 weeks due to schedule.  2. Hyperlipidemia- Praluent increased to 1550mrecently. Patient requesting lipid labs today. Will order direct LDL. In review patient has only been on increased dose a few weeks. Would not make adjustments based off of those labs.  Thank you,  MeRamond DialPharm.D, BCPS, CPP CoDewey [id number]. [address] [id number]: [phone]: [phone]

## 2020-07-16 ENCOUNTER — Telehealth: Payer: Self-pay | Admitting: Pharmacist

## 2020-07-16 ENCOUNTER — Other Ambulatory Visit: Payer: Self-pay | Admitting: Neurosurgery

## 2020-07-16 DIAGNOSIS — D320 Benign neoplasm of cerebral meninges: Secondary | ICD-10-CM

## 2020-07-16 LAB — BASIC METABOLIC PANEL
BUN/Creatinine Ratio: 19 (ref 12–28)
BUN: 16 mg/dL (ref 8–27)
CO2: 27 mmol/L (ref 20–29)
Calcium: 9.8 mg/dL (ref 8.7–10.3)
Chloride: 98 mmol/L (ref 96–106)
Creatinine, Ser: 0.85 mg/dL (ref 0.57–1.00)
Glucose: 213 mg/dL — ABNORMAL HIGH (ref 65–99)
Potassium: 4.1 mmol/L (ref 3.5–5.2)
Sodium: 139 mmol/L (ref 134–144)
eGFR: 77 mL/min/{1.73_m2} (ref >59)

## 2020-07-16 LAB — LDL CHOLESTEROL, DIRECT: LDL Direct: 67 mg/dL (ref 0–99)

## 2020-07-16 MED ORDER — CHLORTHALIDONE 25 MG PO TABS: 25.0000 mg | ORAL_TABLET | Freq: Every day | ORAL | 3 refills | Status: DC

## 2020-07-16 NOTE — Telephone Encounter (Signed)
BMET stable, pt aware of results and to increase chlorthalidone to 25mg  daily as discussed at visit yesterday, new rx sent to pharmacy. Direct LDL well controlled at 67 after taking 1 dose of higher Praluent 150mg . No changes needed for lipid meds. Pt aware to keep follow up appt with PharmD in 3 weeks.

## 2020-07-26 ENCOUNTER — Encounter: Payer: Self-pay | Admitting: Neurology

## 2020-07-26 ENCOUNTER — Telehealth: Payer: Self-pay | Admitting: Neurology

## 2020-07-26 ENCOUNTER — Ambulatory Visit (INDEPENDENT_AMBULATORY_CARE_PROVIDER_SITE_OTHER): Payer: 59 | Admitting: Neurology

## 2020-07-26 VITALS — Ht 72.0 in | Wt 267.0 lb

## 2020-07-26 DIAGNOSIS — Z8673 Personal history of transient ischemic attack (TIA), and cerebral infarction without residual deficits: Secondary | ICD-10-CM

## 2020-07-26 DIAGNOSIS — G245 Blepharospasm: Secondary | ICD-10-CM | POA: Diagnosis not present

## 2020-07-26 DIAGNOSIS — G249 Dystonia, unspecified: Secondary | ICD-10-CM

## 2020-07-26 DIAGNOSIS — Z9889 Other specified postprocedural states: Secondary | ICD-10-CM

## 2020-07-26 DIAGNOSIS — Z8603 Personal history of neoplasm of uncertain behavior: Secondary | ICD-10-CM

## 2020-07-26 DIAGNOSIS — R413 Other amnesia: Secondary | ICD-10-CM | POA: Diagnosis not present

## 2020-07-26 DIAGNOSIS — G3184 Mild cognitive impairment, so stated: Secondary | ICD-10-CM

## 2020-07-26 NOTE — Patient Instructions (Addendum)
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  She may have component of underlying anxiety which could be contributing.  I encourage her to increase participation in regular stress laxation activities like meditation, exercise and yoga.  If her blepharospasm does not respond or gets worse may consider Botox at subsequent visits.  She also complains of short-term memory difficulties which are due to mild cognitive impairment.  I recommend checking lab work for reversible causes of memory loss, EEG and MRI scan of the brain with and without contrast.  I also discussed memory compensation strategies with her and advised her to increase participation in cognitively challenging activities like solving crossword puzzles, playing bridge and sodoku.  She was advised to continue aspirin for stroke prevention and maintain aggressive risk factor modification with strict control of diabetes with hemoglobin A1c goal below 6.5, lipids with LDL cholesterol goal below 70 mg percent and hypertension and blood pressure goal below 130/90.  She will return for follow-up in the future in 2 months or call earlier if necessary. Memory Compensation Strategies  1. Use "WARM" strategy.  W= write it down  A= associate it  R= repeat it  M= make a mental note  2.   You can keep a Social worker.  Use a 3-ring notebook with sections for the following: calendar, important names and phone numbers,  medications, doctors' names/phone numbers, lists/reminders, and a section to journal what you did  each day.   3.    Use a calendar to write appointments down.  4.    Write yourself a schedule for the day.  This can be placed on the calendar or in a separate section of the Memory Notebook.  Keeping a  regular schedule can help memory.  5.    Use medication organizer with sections for each day or morning/evening pills.  You may need help loading it  6.     Keep a basket, or pegboard by the door.  Place items that you need to take out with you in the basket or on the pegboard.  You may also want to  include a message board for reminders.  7.    Use sticky notes.  Place sticky notes with reminders in a place where the task is performed.  For example: " turn off the  stove" placed by the stove, "lock the door" placed on the door at eye level, " take your medications" on  the bathroom mirror or by the place where you normally take your medications.  8.    Use alarms/timers.  Use while cooking to remind yourself to check on food or as a reminder to take your medicine, or as a  reminder to make a call, or as a reminder to perform another task, etc.  Benign Essential Blepharospasm, Adult Benign essential blepharospasm (BEB) is a nervous system condition that makes a person close his or her eyes without meaning to. Over time, episodes of this condition may gradually become more frequent and forceful, and eventually involve both eyes. This can make it hard for you to keep your eyes open and do activities such as watching television, driving, or reading. If this condition is not treated, the eyes may close forcefully for long periods of time. What are the causes? The exact cause of this condition is not known. The condition may be passed down through families with an abnormal gene. Symptoms of the condition may be triggered  by:  The wind.  Sunlight or bright lights.  Noise.  Stress.  Fatigue.  Reading.  Driving.  Walking outside.  Air pollution.  Eye strain. What increases the risk? You are more likely to develop this condition if:  You are age 55 or older.  You have a family history of the condition.  You are female.  There are problems with the part of your brain that controls movements (basal ganglia).  You have a movement disorder called dystonia.  You have a history of eye diseases or trauma to the eye(s).  You take certain  medicines, such as medicines that treat Parkinson disease. What are the signs or symptoms? The first symptom of this condition is frequent eye blinking or twitching that you cannot control. It may happen during the day and disappear at night. Other early symptoms include:  Eye dryness and irritation.  Eye irritation or pain from bright lights (photophobia). You may get temporary relief from your symptoms when you sing, yawn, chew, or laugh. Later symptoms of this condition include:  Winking and squinting for longer than usual.  Muscle spasms in your tongue and jaw (Meigesyndrome).  Inability to keep your eyes open for long periods of time. Over time, symptoms may get stronger and last longer. How is this diagnosed? This condition is diagnosed based on your symptoms, your medical history, and a physical exam. How is this treated? There is no cure for this condition, but treatment can help with symptoms. Treatment options include:  Applying moisturizing eye drops (artificial tears) to the eye. These drops help to relieve eye irritation and dryness.  Getting an injection of botulinum toxin into the muscles that control eyelid movement. This treatment may need to be repeated every few months.  Taking medicines such as muscle relaxants and anti-anxiety medicines.  Having surgery to remove part of the eyelid muscles (myectomy). This may be done if injections of botulinum toxin do not work or they stop working.   Follow these instructions at home: Lifestyle  Wear tinted sunglasses that block UV (ultraviolet) light.  Wear eye protection outdoors on windy days.  Get enough sleep.  Try to manage or avoid stressful situations.  Do not watch TV or have screen time for long periods of time.  Avoid things that trigger your condition. General instructions  Learn as much as you can about your condition.  Work closely with your team of health care providers.  Use over-the-counter and  prescription medicines, including eye drops, only as told by your health care provider.  Keep all follow-up visits as told by your health care provider. This is important.  Do not drive if you are having an episode that affects how well you can see.  Keep your eyelids clean. Wash them daily with mild soap and water. This will help to prevent irritation and infection. Contact a health care provider if:  Your symptoms are not controlled with treatment.  Your eyes are red, teary, or dry.  Your eyes droop.  You feel anxious or depressed. Get help right away if:  You cannot open your eyes. Summary  Benign essential blepharospasm (BEB) is a nervous system condition that makes a person close his or her eyes without meaning to.  The exact cause of this condition is not known. The condition may be passed down through families with an abnormal gene.  There is no cure for this condition, but treatment can help with symptoms. This information is not intended to replace advice given  to you by your health care provider. Make sure you discuss any questions you have with your health care provider. Document Revised: 12/26/2019 Document Reviewed: 12/26/2019 Elsevier Patient Education  2021 [employer].

## 2020-07-26 NOTE — Telephone Encounter (Signed)
A MRI Brain w/wo contrast has already been order by Dr. Ashok Pall and it is scheduled at [facility] for 08/12/20.

## 2020-07-26 NOTE — Progress Notes (Signed)
[facility] [address] [place]. [place] [address] [phone]       OFFICE CONSULT NOTE  Destiny Beasley Date of Birth:  Apr 24, 1956 Medical Record Number:  [id number]   Referring MD: Lona Kettle  Reason for Referral: Eyelid twitching  HPI: He is Destiny Beasley is a 64 year old African-American lady seen today for initial office consultation visit for eyelid twitching and memory loss.  History is obtained from the patient and review of electronic medical records and personally reviewed pertinent available imaging films in PACS.  She has past medical history of diabetes, hypertension, hyperlipidemia, obstructive sleep apnea on CPAP, remote breast cancer s/p lumpectomy and radiation, right cerebellopontine angle meningioma s/p surgery in 2005 with small recurrence which has been stable.  Right hemispheric TIA in December 2017 with left-sided numbness and stable from neurovascular standpoint since then.  She states for the last few months she has noticed some involuntary twitching's of her eyelids particularly the right eye.  This occurs without warning.  May occur for a few moments and then stop and not occur for hours.  She has not noticed particular triggers or relieving factors for this.  She denies any history of eye infection of the burning sensation or dryness of her eyes.  She was seen by ophthalmologist but she is unable to tell me the name who did a thorough eye exam and found nothing wrong.  She denies any worsening of headaches gait or balance problems.  She does have mild gait imbalance which is residual from a brain tumor surgery but it is not getting progressively worse.  She does admit to anxiety and primary care physician recently changed her medications and given her some as needed Xanax which seems to help.  She is also on Effexor but has managed to reduce the dose recently.  She does complain of mild short-term memory difficulties which seems to be getting worse.   Gotten lost while driving.  She is forgetful and even after writing things down has trouble keeping up with her appointments.  She has not had any evaluation for reversible causes of memory loss.  Her last CT scan of the head was on 11/16/2019 which showed stable appearance of 2 small residual right CP angle tumor with postop craniotomy changes.  She is not any recurrent TIA or stroke symptoms.  She remains on aspirin.  She states her blood pressure is under good control.  She has not had any recent lipid profile hemoglobin A1c checked.  ROS:   14 system review of systems is positive for gait ataxia, imbalance, twitchings of the eyelids, headache and all other systems negative  PMH:  Past Medical History:  Diagnosis Date  . Anemia   . Anxiety   . CAD in native artery    a. Inf STEMI 09/2561 complicated by cardiogenic shock/complete heart block s/p emergent cath showing culprit large dominant LCx s/p aspiration thrombectomy and DES, EF 50% by cath, 40-45% by echo.  . Chronic systolic heart failure ([place]) 06/25/2017   Echo 11/19: mild LVH, EF 40-45, inf-lat HK, Gr 1 DD, trivial MR  . Complete heart block, transient ([place])    a. 06/2017 in setting of acute inf MI -> resolved after PCI.  Marland Kitchen COVID-19   . Diabetes mellitus   . Goiter   . History of radiation therapy 06/14/2016 - 07/19/2016   Right Breast 50 Gy 25 fractions  . Hypercholesteremia   . Hypertension   . Ischemic cardiomyopathy  a. EF 50% by cath and 40-45% by echo 06/2017.  . Leg pain    ABIs 07/2019: Normal (R 1.27; L 1.25)  . Malignant neoplasm of upper-outer quadrant of right female breast ([place]) 02/24/2016  . Meningioma ([place])   . Nuclear stress test    Nuclear stress test 10/19: EF 35, inferolateral, apical scar; inferior, apical inferior infarct with mild peri-infarct ischemia; high risk  . Obesity   . Personal history of chemotherapy   . Personal history of radiation therapy   . Pituitary tumor   . Seasonal asthma   . Sleep  apnea    does not use every night  . Stroke [facility])    TIA - Dec 1 st, 2017    Social History:  Social History   Socioeconomic History  . Marital status: Single    Spouse name: Not on file  . Number of children: Not on file  . Years of education: Not on file  . Highest education level: Not on file  Occupational History  . Occupation: full time  Tobacco Use  . Smoking status: Never Smoker  . Smokeless tobacco: Never Used  Vaping Use  . Vaping Use: Never used  Substance and Sexual Activity  . Alcohol use: Yes    Alcohol/week: 1.0 standard drink    Types: 1 Glasses of wine per week    Comment: 1 glass monthly  . Drug use: No  . Sexual activity: Not on file    Comment: no cycles; pt had hyst  Other Topics Concern  . Not on file  Social History Narrative   Lives alone   Right Handed   Drinks 2-3 cups caffeine daily   Social Determinants of Health   Financial Resource Strain: Not on file  Food Insecurity: Not on file  Transportation Needs: Not on file  Physical Activity: Not on file  Stress: Not on file  Social Connections: Not on file  Intimate Partner Violence: Not on file    Medications:   Current Outpatient Medications on File Prior to Visit  Medication Sig Dispense Refill  . ADVAIR HFA 115-21 MCG/ACT inhaler Inhale 2 puffs into the lungs in the morning and at bedtime.    . Alirocumab (PRALUENT) 150 MG/ML SOAJ Inject 1 pen into the skin every 14 (fourteen) days. 2 mL 11  . ALPRAZOLAM PO Take 12.5 mg by mouth 2 (two) times daily as needed (anxiety attack).    Marland Kitchen aspirin EC 81 MG tablet Take 1 tablet (81 mg total) by mouth daily. 90 tablet 3  . carvedilol (COREG) 12.5 MG tablet Take 1.5 tablets (18.75 mg total) by mouth 2 (two) times daily. 270 tablet 3  . chlorthalidone (HYGROTON) 25 MG tablet Take 1 tablet (25 mg total) by mouth daily. (Patient taking differently: Take 12.5 mg by mouth daily.) 90 tablet 3  . Cholecalciferol (VITAMIN D-3 PO) Take 1 tablet by mouth  daily.     . Continuous Blood Gluc Receiver (FREESTYLE LIBRE 2 READER) DEVI Use reader to monitor blood sugars continuously. 1 each 0  . Continuous Blood Gluc Sensor (FREESTYLE LIBRE 2 SENSOR) MISC USE 1 SENSOR ONCE EVERY 14  DAYS TO MONITOR BLOOD  SUGARS CONTINUOUSLY 5 each 1  . glucose blood (FREESTYLE TEST STRIPS) test strip Use as instructed 100 each 12  . INPEN 100-BLUE-LILLY DEVI CONSULT MD FOR APP SETTINGS. 1 each 0  . Insulin Disposable Pump (OMNIPOD DASH 5 PACK PODS) MISC Use one pod every 2 days 15 each 3  .  Insulin Disposable Pump (OMNIPOD DASH SYSTEM) KIT Use to monitor blood sugar 1 kit 0  . insulin glargine (LANTUS SOLOSTAR) 100 UNIT/ML Solostar Pen Inject 70 Units into the skin daily. 30 mL 3  . insulin lispro (HUMALOG) 100 UNIT/ML cartridge Inject 0.03-0.25 mLs (3-25 Units total) into the skin 3 (three) times daily with meals. Per sliding scale. 30 mL 3  . irbesartan (AVAPRO) 300 MG tablet Take 300 mg by mouth daily.    . MULTIPLE VITAMIN PO Take 1 tablet daily by mouth.    . nitroGLYCERIN (NITROSTAT) 0.4 MG SL tablet DISSOLVE ONE TABLET UNDER TONGUE AS NEEDED FOR CHEST PAIN 25 tablet 4  . pantoprazole (PROTONIX) 40 MG tablet Take 40 mg by mouth daily.    . Semaglutide,0.25 or 0.5MG /DOS, (OZEMPIC, 0.25 OR 0.5 MG/DOSE,) 2 MG/1.5ML SOPN Inject 0.5 mg into the skin once a week. 2 mL 2  . terbinafine (LAMISIL) 250 MG tablet Take 250 mg by mouth daily. (Patient not taking: Reported on 05/27/2020)    . valACYclovir (VALTREX) 1000 MG tablet Take 1,000 mg by mouth 2 (two) times daily.    Marland Kitchen venlafaxine XR (EFFEXOR-XR) 150 MG 24 hr capsule Take 225 mg by mouth daily.     No current facility-administered medications on file prior to visit.    Allergies:   Allergies  Allergen Reactions  . Hydrocodone Other (See Comments)    Severe anxiety Confusion   . Invokana [Canagliflozin] Other (See Comments)    Constant yeast infections  . Evolocumab     Injection site pain  . Hydralazine      Fatigue  . Imdur [Isosorbide Nitrate] Other (See Comments)    Patient reported wheezing  . Rosuvastatin     myalgias  . Simvastatin     myalgias  . Spironolactone     Wheezing vs fatigue?  Marland Kitchen Atorvastatin Other (See Comments)    DIZZINESS    Physical Exam General: Obese middle-aged African-American lady, seated, in no evident distress Head: head normocephalic and atraumatic.   Neck: supple with no carotid or supraclavicular bruits Cardiovascular: regular rate and rhythm, no murmurs Musculoskeletal: no deformity Skin:  no rash/petichiae Vascular:  Normal pulses all extremities  Neurologic Exam Mental Status: Awake and fully alert. Oriented to place and time. Recent and remote memory intact. Attention span, concentration and fund of knowledge appropriate. Mood and affect appropriate.  Intact recall 3/3.  Able to name only 10 animals which can walk on 4 legs.  Clock drawing 3/4. Cranial Nerves: Fundoscopic exam reveals sharp disc margins. Pupils equal, briskly reactive to light. Extraocular movements full without nystagmus.  She has mild intermittent blepharospasm right eye greater than left.  Mild ptosis on sustained upgaze but denies any diplopia.  Visual fields full to confrontation. Hearing intact. Facial sensation intact. Face, tongue, palate moves normally and symmetrically.  Motor: Normal bulk and tone. Normal strength in all tested extremity muscles. Sensory.: intact to touch , pinprick , position and vibratory sensation.  Coordination: Rapid alternating movements normal in all extremities. Finger-to-nose and heel-to-shin performed accurately bilaterally. Gait and Station: Arises from chair without difficulty. Stance is normal. Gait demonstrates normal stride length and balance .  Unable to do tandem walking and unsteady on either foot unsupported.. Reflexes: 1+ and symmetric. Toes downgoing.   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  She also has complaints of short-term memory difficulties which are likely due to mild cognitive impairment.  Remote history of right hemispheric TIA from small vessel disease in December 2017 and stable from neurovascular standpoint since then.  History of posterior fossa meningioma resection in 2015.     PLAN: I had a long discussion with the patient regarding her involuntary eye twitchings which likely represent benign blepharospasm.  She may have component of underlying anxiety which could be contributing.  I encourage her to increase participation in regular stress laxation activities like meditation, exercise and yoga.  If her blepharospasm does not respond or gets worse may consider Botox at subsequent visits.  She also complains of short-term memory difficulties which are due to mild cognitive impairment.  I recommend checking lab work for reversible causes of memory loss, lipid panel, HbA1c,EEG and MRI scan of the brain with and without contrast.  I also discussed memory compensation strategies with her and advised her to increase participation in cognitively challenging activities like solving crossword puzzles, playing bridge and sodoku.  She was advised to continue aspirin for stroke prevention and maintain aggressive risk factor modification with strict control of diabetes with hemoglobin A1c goal below 6.5, lipids with LDL cholesterol goal below 70 mg percent and hypertension and blood pressure goal below 130/90.  She will return for follow-up in the future in 2 months or call earlier if necessary.  Greater than 50% time during this prolonged 60-minute consultation visit was spent on counseling and coordination of care about her complaints of blepharospasm, mild cognitive impairment, history of TIA and meningioma surgery and answering  questions.  Antony Contras MD  Note: This document was prepared with digital dictation and possible smart phrase technology. Any transcriptional errors that result from this process are unintentional.

## 2020-07-27 LAB — HEMOGLOBIN A1C
Est. average glucose Bld gHb Est-mCnc: 212 mg/dL
Hgb A1c MFr Bld: 9 % — ABNORMAL HIGH (ref 4.8–5.6)

## 2020-07-27 LAB — DEMENTIA PANEL
Homocysteine: 8 umol/L (ref 0.0–17.2)
RPR Ser Ql: NONREACTIVE
TSH: 1.27 u[IU]/mL (ref 0.450–4.500)
Vitamin B-12: 1698 pg/mL — ABNORMAL HIGH (ref 232–1245)

## 2020-07-27 LAB — LIPID PANEL
Chol/HDL Ratio: 2.7 ratio (ref 0.0–4.4)
Cholesterol, Total: 133 mg/dL (ref 100–199)
HDL: 50 mg/dL (ref >39)
LDL Chol Calc (NIH): 51 mg/dL (ref 0–99)
Triglycerides: 200 mg/dL — ABNORMAL HIGH (ref 0–149)
VLDL Cholesterol Cal: 32 mg/dL (ref 5–40)

## 2020-07-28 ENCOUNTER — Encounter: Payer: Self-pay | Admitting: *Deleted

## 2020-07-28 NOTE — Progress Notes (Signed)
Kindly inform the patient that lab work for reversible causes of memory loss was normal.  Cholesterol profile was mostly satisfactory except elevated triglycerides.  Screening test for diabetes was quite unsatisfactory and hemoglobin A1c was quite elevated at 9.2.  Kindly advised the patient to see primary care physician for treatment for the above

## 2020-08-04 ENCOUNTER — Ambulatory Visit (INDEPENDENT_AMBULATORY_CARE_PROVIDER_SITE_OTHER): Payer: 59 | Admitting: Pharmacist

## 2020-08-04 ENCOUNTER — Other Ambulatory Visit: Payer: Self-pay

## 2020-08-04 VITALS — BP 164/88 | HR 85

## 2020-08-04 DIAGNOSIS — I10 Essential (primary) hypertension: Secondary | ICD-10-CM | POA: Diagnosis not present

## 2020-08-04 DIAGNOSIS — I5022 Chronic systolic (congestive) heart failure: Secondary | ICD-10-CM

## 2020-08-04 LAB — BASIC METABOLIC PANEL
BUN/Creatinine Ratio: 19 (ref 12–28)
BUN: 21 mg/dL (ref 8–27)
CO2: 27 mmol/L (ref 20–29)
Calcium: 9.5 mg/dL (ref 8.7–10.3)
Chloride: 99 mmol/L (ref 96–106)
Creatinine, Ser: 1.1 mg/dL — ABNORMAL HIGH (ref 0.57–1.00)
Glucose: 337 mg/dL — ABNORMAL HIGH (ref 65–99)
Potassium: 4.9 mmol/L (ref 3.5–5.2)
Sodium: 137 mmol/L (ref 134–144)
eGFR: 57 mL/min/{1.73_m2} — ABNORMAL LOW (ref >59)

## 2020-08-04 MED ORDER — EMPAGLIFLOZIN 10 MG PO TABS: 10.0000 mg | ORAL_TABLET | Freq: Every day | ORAL | 11 refills | Status: DC

## 2020-08-04 NOTE — Patient Instructions (Addendum)
Please start taking Jardiance 10mg  daily  Continue taking carvedilol 18.75mg  twice a day, chlorthalidone 25mg  daily and irbesartan 300mg  daily.  Please check blood pressure once a day  Try to walk as much as tolerated  Call we with any issues at [phone]

## 2020-08-04 NOTE — Progress Notes (Signed)
Patient ID: Destiny Beasley                 DOB: Jul 18, 1955                      MRN: [id number]      HPI: Destiny Beasley is a 65 y.o. female patient of Dr. Burt Knack referred by Richardson Dopp, PA to HTN clinic. PMH is significant forCAD s/p STEMI in 5427, systolic CHF w/ last EF 0/62 recovered to 40-45%, T2DM, prior TIA, HTN and HLD. Pt has previously followed with PharmD team for cholesterol management for which she currently takes Praluent injections.   She was last seen in HTN clinic on 07/15/20. Blood pressure was 154/80. Chlorthalidone was increased to 25mg  daily. It appears her blood pressure is if affected greatly by her anxiety. She said her PCP referred her to a mental health provider.  Patient sent my-chart message about chlorthalidone potentially causing high TG and wanting to use Jardiance.  Patient presents today for follow up. She reports that she stopped taking the chlorthalidone because she thought it was causing her legs to hurt. But states she has resumed taking it last week. Thinks it might be her Ozempic,or she has sciatica. Supposed to be seeing a doctor about it. States she does not miss medication doses. Has a 30 day pill box/dispenser.   Denies dizziness or lightheadedness. She still has been walking on treadmill some, but not able to walk as fast. Cannot remember being on Entresto, although its listed in meds she didn't tolerate.  Blood pressure at Dr. Gabriel Carina yesterday was 138/64. Has cut back to 2 cups of coffee per day.  Current HTN meds: carvedilol 18.75 mg BID, chlorthalidone 25mg  daily, irbesartan 300 mg daily Previously tried: spironolactone 25 mg daily (wheezing vs. fatigue), hydralazine 25, 50 mg TID (fatigue), Imdur 30 mg daily (wheezing), Entresto (?), Invokana (frequent yeast infections), carvedilol 25mg  BID (wheezing), amlodipine 10 mg daily (makes her muscles hurt) BP goal: <130/80 mmHg  Family History:The patient'sfamily history includes Diabetes in her  mother; Hyperlipidemia in her mother; Hypertension in her mother.  Social History:never smoked, + ETOH  Diet: drinks: water, coffee (2 cups), Gatorade zero Fresh or frozen vegetables  Exercise: 20 min walking on treadmill 5 times a week  Home BP readings: 136/63, 149/83,150/80,150/83  Wt Readings from Last 3 Encounters:  07/26/20 267 lb (121.1 kg)  05/27/20 267 lb 9.6 oz (121.4 kg)  05/12/20 260 lb 6.4 oz (118.1 kg)   BP Readings from Last 3 Encounters:  07/15/20 (!) 154/80  06/23/20 (!) 150/92  05/27/20 138/86   Pulse Readings from Last 3 Encounters:  07/15/20 92  06/23/20 98  05/27/20 95    Renal function: Estimated Creatinine Clearance: 100 mL/min (by C-G formula based on SCr of 0.85 mg/dL).  Past Medical History:  Diagnosis Date  . Anemia   . Anxiety   . CAD in native artery    a. Inf STEMI 06/7626 complicated by cardiogenic shock/complete heart block s/p emergent cath showing culprit large dominant LCx s/p aspiration thrombectomy and DES, EF 50% by cath, 40-45% by echo.  . Chronic systolic heart failure ([place]) 06/25/2017   Echo 11/19: mild LVH, EF 40-45, inf-lat HK, Gr 1 DD, trivial MR  . Complete heart block, transient ([place])    a. 06/2017 in setting of acute inf MI -> resolved after PCI.  Marland Kitchen COVID-19   . Diabetes mellitus   . Goiter   .  History of radiation therapy 06/14/2016 - 07/19/2016   Right Breast 50 Gy 25 fractions  . Hypercholesteremia   . Hypertension   . Ischemic cardiomyopathy    a. EF 50% by cath and 40-45% by echo 06/2017.  . Leg pain    ABIs 07/2019: Normal (R 1.27; L 1.25)  . Malignant neoplasm of upper-outer quadrant of right female breast ([place]) 02/24/2016  . Meningioma ([place])   . Nuclear stress test    Nuclear stress test 10/19: EF 35, inferolateral, apical scar; inferior, apical inferior infarct with mild peri-infarct ischemia; high risk  . Obesity   . Personal history of chemotherapy   . Personal history of radiation therapy   . Pituitary  tumor   . Seasonal asthma   . Sleep apnea    does not use every night  . Stroke [facility])    TIA - Dec 1 st, 2017    Current Outpatient Medications on File Prior to Visit  Medication Sig Dispense Refill  . ADVAIR HFA 115-21 MCG/ACT inhaler Inhale 2 puffs into the lungs in the morning and at bedtime.    . Alirocumab (PRALUENT) 150 MG/ML SOAJ Inject 1 pen into the skin every 14 (fourteen) days. 2 mL 11  . ALPRAZOLAM PO Take 12.5 mg by mouth 2 (two) times daily as needed (anxiety attack).    Marland Kitchen aspirin EC 81 MG tablet Take 1 tablet (81 mg total) by mouth daily. 90 tablet 3  . carvedilol (COREG) 12.5 MG tablet Take 1.5 tablets (18.75 mg total) by mouth 2 (two) times daily. 270 tablet 3  . chlorthalidone (HYGROTON) 25 MG tablet Take 1 tablet (25 mg total) by mouth daily. (Patient taking differently: Take 12.5 mg by mouth daily.) 90 tablet 3  . Cholecalciferol (VITAMIN D-3 PO) Take 1 tablet by mouth daily.     . Continuous Blood Gluc Receiver (FREESTYLE LIBRE 2 READER) DEVI Use reader to monitor blood sugars continuously. 1 each 0  . Continuous Blood Gluc Sensor (FREESTYLE LIBRE 2 SENSOR) MISC USE 1 SENSOR ONCE EVERY 14  DAYS TO MONITOR BLOOD  SUGARS CONTINUOUSLY 5 each 1  . glucose blood (FREESTYLE TEST STRIPS) test strip Use as instructed 100 each 12  . INPEN 100-BLUE-LILLY DEVI CONSULT MD FOR APP SETTINGS. 1 each 0  . Insulin Disposable Pump (OMNIPOD DASH 5 PACK PODS) MISC Use one pod every 2 days 15 each 3  . Insulin Disposable Pump (OMNIPOD DASH SYSTEM) KIT Use to monitor blood sugar 1 kit 0  . insulin glargine (LANTUS SOLOSTAR) 100 UNIT/ML Solostar Pen Inject 70 Units into the skin daily. 30 mL 3  . insulin lispro (HUMALOG) 100 UNIT/ML cartridge Inject 0.03-0.25 mLs (3-25 Units total) into the skin 3 (three) times daily with meals. Per sliding scale. 30 mL 3  . irbesartan (AVAPRO) 300 MG tablet Take 300 mg by mouth daily.    . MULTIPLE VITAMIN PO Take 1 tablet daily by mouth.    . nitroGLYCERIN  (NITROSTAT) 0.4 MG SL tablet DISSOLVE ONE TABLET UNDER TONGUE AS NEEDED FOR CHEST PAIN 25 tablet 4  . pantoprazole (PROTONIX) 40 MG tablet Take 40 mg by mouth daily.    . Semaglutide,0.25 or 0.5MG /DOS, (OZEMPIC, 0.25 OR 0.5 MG/DOSE,) 2 MG/1.5ML SOPN Inject 0.5 mg into the skin once a week. 2 mL 2  . terbinafine (LAMISIL) 250 MG tablet Take 250 mg by mouth daily. (Patient not taking: Reported on 05/27/2020)    . valACYclovir (VALTREX) 1000 MG tablet Take 1,000 mg by mouth  2 (two) times daily.    Marland Kitchen venlafaxine XR (EFFEXOR-XR) 150 MG 24 hr capsule Take 225 mg by mouth daily.     No current facility-administered medications on file prior to visit.    Allergies  Allergen Reactions  . Hydrocodone Other (See Comments)    Severe anxiety Confusion   . Invokana [Canagliflozin] Other (See Comments)    Constant yeast infections  . Evolocumab     Injection site pain  . Hydralazine     Fatigue  . Imdur [Isosorbide Nitrate] Other (See Comments)    Patient reported wheezing  . Rosuvastatin     myalgias  . Simvastatin     myalgias  . Spironolactone     Wheezing vs fatigue?  Marland Kitchen Atorvastatin Other (See Comments)    DIZZINESS    There were no vitals taken for this visit.   Assessment/Plan:  1. Hypertension/CHF - Blood pressure is above goal of <130/80 in clinic today. Home readings a little better. BP was [facility] at [facility] yesterday. She wants to try Jardiance. I reviewed the benefits for CHF. Small blood pressure reduction. I did advise patient that Vania Rea is in the same drug class as Invokana which patient took in the past and reported frequent yeast injections. I reminded her that there are good chances this could happen again. Patient still wanted to try again. I advised that Vania Rea will not provide enough blood pressure lowering and she will most likely need another agent. Patient wished to hold off on another BP medication until after starting Jardiance. Will start Jardiance 10mg  daily.  Continue carvedilol 18.75mg  twice a day, chlorthalidone 25mg  daily and irbesartan 300mg  daily. Check blood pressure once a day at least 2 hr after medications. Follow up in clinic in 3 weeks.  Encouraged her to continue to exercise and increase length as able. Will get a BMP today since the dose of chlorthalidone was increased.  2. Medication compliance- patient seems to think all issues that arise are caused by her medications. She will frequently stop medications thinking they are causing issues. I wonder if this, along with anxiety, are playing into her difficult to control blood pressure.    Thank you,  Ramond Dial, Pharm.D, BCPS, CPP [place]  [address], [place], [place] [address]  Phone: [phone]; Fax: [phone]

## 2020-08-10 ENCOUNTER — Telehealth: Payer: Self-pay

## 2020-08-10 NOTE — Telephone Encounter (Signed)
**Note De-Identified  Obfuscation** I started a Jardiance PA through covermymeds: Key: [id number]

## 2020-08-11 MED ORDER — NIFEDIPINE ER OSMOTIC RELEASE 30 MG PO TB24: 30.0000 mg | ORAL_TABLET | Freq: Every day | ORAL | 1 refills | Status: DC

## 2020-08-11 NOTE — Telephone Encounter (Signed)
Spoke with pt who is complaining of low energy for the past 3 days and anxiety attacks the last 2 mornings for which she has taken Alprazolam as prescribed by her PCP.  BS is 113 upon waking.  BP is currently 195/91 with headache.  Pt has taken morning medications as prescribed.  She has not checked her BP the last 2 days.  Pt denies CP, SOB, edema or dizziness.    Will forward information to Marcelle Overlie, [facility] for review and recommendation. Fuller Canada, RPH is covering as Marcelle Overlie is out of the office this week. Pt advised she is also due for follow up with Richardson Dopp, PA-C; however he is out of the office this week.  Pt verbalizes understanding and agrees with current plan.

## 2020-08-11 NOTE — Telephone Encounter (Signed)
Spoke with pt and advised of Fuller Canada, RPH recommendations:  Pt has many intolerances to BP medications which has made management difficult. Her anxiety has also contributed to her HTN. Recommend adding nifedipine XL 30mg  once daily and continuing other meds. Agree she should get in for an appt with a provider as well.   Virtual visit scheduled with Richardson Dopp, PA-C for 09/01/2020.  Pt is aware.  Pt is agreeable to starting Nifedipine XL 30mg  - 1 tablet by mouth daily along with other medications.  Rx sent to pharmacy. Pt verbalizes understanding and agrees with current plan.

## 2020-08-11 NOTE — Telephone Encounter (Signed)
**Note De-Identified Elija Mccam[place] Obfuscation** Letter received Namrata Dangler fax from [employer] stating that they denied the pts Jardiance PA. Reason: For not meeting plans criteria for coverage which are:  A. The pt has a history of sub-optimal response after a 3 months of trail, contraindication, or intolerance to metformin (generic Glucophage, Glucophage XR). Even though this PA was done for a DX of CHF not Diabetes.  B. The pt is currently on therapy with the requested medication and requesting continuation of the same therapy  I will forward this phone note to PharmD for advisement as Vania Rea was added at the pts office visit on 4/13 with Marcelle Overlie, [place] who is out of the office this week.

## 2020-08-11 NOTE — Telephone Encounter (Signed)
Appeals will need to be submitted. Assuming whoever reviewed the PA request did so incorrectly as metformin trial is not needed for SGLT2i being used for heart failure. SGLT2i are recommended as first line therapy for patients with CHF, and they do not need to fail any other meds first before qualifying/benefitting for therapy.

## 2020-08-12 ENCOUNTER — Other Ambulatory Visit: Payer: Self-pay

## 2020-08-12 ENCOUNTER — Ambulatory Visit
Admission: RE | Admit: 2020-08-12 | Discharge: 2020-08-12 | Disposition: A | Discharge status: Home / Self Care | Payer: 59 | Source: Ambulatory Visit | Attending: Neurosurgery | Admitting: Neurosurgery

## 2020-08-12 DIAGNOSIS — D320 Benign neoplasm of cerebral meninges: Secondary | ICD-10-CM

## 2020-08-12 MED ORDER — GADOBENATE DIMEGLUMINE 529 MG/ML IV SOLN
20.0000 mL | Freq: Once | INTRAVENOUS | Status: AC | PRN
  Administered 2020-08-12: 20 mL via INTRAVENOUS

## 2020-08-12 NOTE — Telephone Encounter (Signed)
**Note De-Identified  Obfuscation** I called [facility] X 3 and my call was dropped each time after s/w someone for several mins. When I called back the 4th time I s/w Elta Guadeloupe and this call too ended the same way but he was nice enough to call me back.    Elta Guadeloupe advised me that an appeal could not be handled over the phone and that we (Dr [facility] office) would have to provide proof/evedence that the pt has tried and failed Metformin (Generic Glucophage, Glucophage).  Once I explained that I did the original Jardiance PA for the diagnosis of CHF and not diabetes Elta Guadeloupe stated we could start another PA over the phone and we did.   Per Elta Guadeloupe the pts Vania Rea PA has now been approved from today until 08/12/2021.  I have notified [place] [facility] of this approval.

## 2020-08-17 ENCOUNTER — Ambulatory Visit (INDEPENDENT_AMBULATORY_CARE_PROVIDER_SITE_OTHER): Payer: 59 | Admitting: Neurology

## 2020-08-17 DIAGNOSIS — R410 Disorientation, unspecified: Secondary | ICD-10-CM

## 2020-08-17 DIAGNOSIS — G3184 Mild cognitive impairment, so stated: Secondary | ICD-10-CM

## 2020-08-17 DIAGNOSIS — R413 Other amnesia: Secondary | ICD-10-CM

## 2020-08-23 ENCOUNTER — Other Ambulatory Visit: Payer: Self-pay | Admitting: Endocrinology

## 2020-08-23 DIAGNOSIS — E1165 Type 2 diabetes mellitus with hyperglycemia: Secondary | ICD-10-CM

## 2020-08-23 DIAGNOSIS — G8929 Other chronic pain: Secondary | ICD-10-CM | POA: Insufficient documentation

## 2020-08-23 DIAGNOSIS — Z794 Long term (current) use of insulin: Secondary | ICD-10-CM

## 2020-08-24 ENCOUNTER — Telehealth: Payer: Self-pay | Admitting: *Deleted

## 2020-08-24 NOTE — Telephone Encounter (Signed)
  Patient Consent for Virtual Visit         Destiny Beasley has provided verbal consent on 08/24/2020 for a virtual visit (video or telephone).   CONSENT FOR VIRTUAL VISIT FOR:  Destiny Beasley  By participating in this virtual visit I agree to the following:  I hereby voluntarily request, consent and authorize [facility] and its employed or contracted physicians, physician assistants, nurse practitioners or other licensed health care professionals (the Practitioner), to provide me with telemedicine health care services (the "Services") as deemed necessary by the treating Practitioner. I acknowledge and consent to receive the Services by the Practitioner via telemedicine. I understand that the telemedicine visit will involve communicating with the Practitioner through live audiovisual communication technology and the disclosure of certain medical information by electronic transmission. I acknowledge that I have been given the opportunity to request an in-person assessment or other available alternative prior to the telemedicine visit and am voluntarily participating in the telemedicine visit.  I understand that I have the right to withhold or withdraw my consent to the use of telemedicine in the course of my care at any time, without affecting my right to future care or treatment, and that the Practitioner or I may terminate the telemedicine visit at any time. I understand that I have the right to inspect all information obtained and/or recorded in the course of the telemedicine visit and may receive copies of available information for a reasonable fee.  I understand that some of the potential risks of receiving the Services via telemedicine include:  Marland Kitchen Delay or interruption in medical evaluation due to technological equipment failure or disruption; . Information transmitted may not be sufficient (e.g. poor resolution of images) to allow for appropriate medical decision making by the Practitioner;  and/or  . In rare instances, security protocols could fail, causing a breach of personal health information.  Furthermore, I acknowledge that it is my responsibility to provide information about my medical history, conditions and care that is complete and accurate to the best of my ability. I acknowledge that Practitioner's advice, recommendations, and/or decision may be based on factors not within their control, such as incomplete or inaccurate data provided by me or distortions of diagnostic images or specimens that may result from electronic transmissions. I understand that the practice of medicine is not an exact science and that Practitioner makes no warranties or guarantees regarding treatment outcomes. I acknowledge that a copy of this consent can be made available to me via my patient portal ([facility]), or I can request a printed copy by calling the office of [facility].    I understand that my insurance will be billed for this visit.   I have read or had this consent read to me. . I understand the contents of this consent, which adequately explains the benefits and risks of the Services being provided via telemedicine.  . I have been provided ample opportunity to ask questions regarding this consent and the Services and have had my questions answered to my satisfaction. . I give my informed consent for the services to be provided through the use of telemedicine in my medical care

## 2020-08-25 ENCOUNTER — Other Ambulatory Visit (INDEPENDENT_AMBULATORY_CARE_PROVIDER_SITE_OTHER): Payer: 59

## 2020-08-25 ENCOUNTER — Other Ambulatory Visit: Payer: Self-pay

## 2020-08-25 ENCOUNTER — Ambulatory Visit (INDEPENDENT_AMBULATORY_CARE_PROVIDER_SITE_OTHER): Payer: 59 | Admitting: Pharmacist

## 2020-08-25 VITALS — BP 136/82 | HR 100

## 2020-08-25 DIAGNOSIS — I5022 Chronic systolic (congestive) heart failure: Secondary | ICD-10-CM

## 2020-08-25 DIAGNOSIS — I10 Essential (primary) hypertension: Secondary | ICD-10-CM | POA: Diagnosis not present

## 2020-08-25 DIAGNOSIS — E1165 Type 2 diabetes mellitus with hyperglycemia: Secondary | ICD-10-CM

## 2020-08-25 DIAGNOSIS — Z794 Long term (current) use of insulin: Secondary | ICD-10-CM | POA: Diagnosis not present

## 2020-08-25 LAB — COMPREHENSIVE METABOLIC PANEL
ALT: 27 U/L (ref 0–35)
AST: 17 U/L (ref 0–37)
Albumin: 4.2 g/dL (ref 3.5–5.2)
Alkaline Phosphatase: 95 U/L (ref 39–117)
BUN: 34 mg/dL — ABNORMAL HIGH (ref 6–23)
CO2: 31 mEq/L (ref 19–32)
Calcium: 10 mg/dL (ref 8.4–10.5)
Chloride: 102 mEq/L (ref 96–112)
Creatinine, Ser: 1.16 mg/dL (ref 0.40–1.20)
GFR: 50.5 mL/min — ABNORMAL LOW (ref >60.00)
Glucose, Bld: 219 mg/dL — ABNORMAL HIGH (ref 70–99)
Potassium: 4.2 mEq/L (ref 3.5–5.1)
Sodium: 138 mEq/L (ref 135–145)
Total Bilirubin: 0.4 mg/dL (ref 0.2–1.2)
Total Protein: 8.2 g/dL (ref 6.0–8.3)

## 2020-08-25 LAB — URINALYSIS, ROUTINE W REFLEX MICROSCOPIC
Bilirubin Urine: NEGATIVE
Hgb urine dipstick: NEGATIVE
Ketones, ur: NEGATIVE
Leukocytes,Ua: NEGATIVE
Nitrite: NEGATIVE
RBC / HPF: NONE SEEN (ref 0–?)
Specific Gravity, Urine: 1.015 (ref 1.000–1.030)
Total Protein, Urine: NEGATIVE
Urine Glucose: >=1000 — AB
Urobilinogen, UA: 0.2 (ref 0.0–1.0)
pH: 5.5 (ref 5.0–8.0)

## 2020-08-25 LAB — MICROALBUMIN / CREATININE URINE RATIO
Creatinine,U: 96.3 mg/dL
Microalb Creat Ratio: 0.7 mg/g (ref 0.0–30.0)
Microalb, Ur: <0.7 mg/dL (ref 0.0–1.9)

## 2020-08-25 MED ORDER — CHLORTHALIDONE 25 MG PO TABS: 12.5000 mg | ORAL_TABLET | Freq: Every day | ORAL | 3 refills | Status: DC

## 2020-08-25 MED ORDER — CARVEDILOL 12.5 MG PO TABS: 12.5000 mg | ORAL_TABLET | Freq: Two times a day (BID) | ORAL | 3 refills | Status: DC

## 2020-08-25 NOTE — Patient Instructions (Addendum)
Please start taking carvedilol twice a day- try taking 12.5 twice a day It is important to take the medication twice a day. Please take the highest dose you can tolerate each morning and night.  Continue irbesartan 300mg  daily and chlorthalidone 12.5mg  daily  Continue checking your blood pressure  Call me at [phone] with any questions

## 2020-08-25 NOTE — Progress Notes (Signed)
Patient ID: SHAMERIA TRIMARCO                 DOB: [birth year]                      MRN: [id number]      HPI: Destiny Beasley is a 65 y.o. female patient of Dr. Burt Knack referred by Richardson Dopp, PA to HTN clinic. PMH is significant forCAD s/p STEMI in 6389, systolic CHF w/ last EF 3/73 recovered to 40-45%, T2DM, prior TIA, HTN and HLD. Pt has previously followed with PharmD team for cholesterol management for which she currently takes Praluent injections.   She was last seen in HTN clinic on 07/15/20. Blood pressure was 154/80. Chlorthalidone was increased to 51m daily. It appears her blood pressure is if affected greatly by her anxiety. She said her PCP referred her to a mental health provider.  At last visit she was started on Jardiance. She was advised that this will help blood sugars some and blood pressure just a little bit. She refused adding an addional blood pressure medication at that time. Patient sent my-chart message last week about her blood pressure medications not working. I advised her that since there had been apparently an issue with the dose of irbesartan given to her at the pharmacy and that she often self starts and stops medications that she needed to take her mediations consisently to see a good effect. She was potentially started on nifedipine, but unsure if patient started.  Patient presents today for follow up. Reports some dizziness but takes her time standing up. She did not start Nifedipine. She is only taking 1/2 tablet (12.583m of chlorthalidone. She stopped taking Jardiance due to reported lightheadedness, weakness, low BP and yeast infection. She states she has been watching her salt intake and trying to eat a healthier diet. Walking on treadmill 20 min, 5 days a week. She was taking irbesartan 15043mor about a month before she realized the pharmacy gave her the wrong strength. BP is much improved on the higher dose. Reports BP readings in the 110's the past several  days. Her PCP is weaning her off of Effexor and starting her on zoloft. Thinks Effexor may have been increasing her blood pressure. Feels much better as she is weaning off.  Current HTN meds: carvedilol 18.75 mg BID (patient taking only once a day), chlorthalidone 25 mg daily (pt taking 12.5mg59m irbesartan 300 mg daily Previously tried: spironolactone 25 mg daily (wheezing vs. fatigue), hydralazine 25, 50 mg TID (fatigue), Imdur 30 mg daily (wheezing), Entresto (?), Invokana (frequent yeast infections), carvedilol 25mg13m (wheezing), amlodipine 10 mg daily (makes her muscles hurt), chlorthalidone 12.5mg (32m pain?) BP goal: <130/80 mmHg  Family History:The patient'sfamily history includes Diabetes in her mother; Hyperlipidemia in her mother; Hypertension in her mother.  Social History:never smoked, + ETOH  Diet: drinks: water, coffee (2 cups), Gatorade zero Fresh or frozen vegetables  Exercise: 20 min walking on treadmill 5 times a week  Home BP readings: 113/64, 113/70 HR 108 122/81, 119/73, 138/80  Wt Readings from Last 3 Encounters:  07/26/20 267 lb (121.1 kg)  05/27/20 267 lb 9.6 oz (121.4 kg)  05/12/20 260 lb 6.4 oz (118.1 kg)   BP Readings from Last 3 Encounters:  08/04/20 (!) 164/88  07/15/20 (!) 154/80  06/23/20 (!) 150/92   Pulse Readings from Last 3 Encounters:  08/04/20 85  07/15/20 92  06/23/20 98    Renal  function: CrCl cannot be calculated (Unknown ideal weight.).  Past Medical History:  Diagnosis Date  . Anemia   . Anxiety   . CAD in native artery    a. Inf STEMI 09/5788 complicated by cardiogenic shock/complete heart block s/p emergent cath showing culprit large dominant LCx s/p aspiration thrombectomy and DES, EF 50% by cath, 40-45% by echo.  . Chronic systolic heart failure ([facility]) 06/25/2017   Echo 11/19: mild LVH, EF 40-45, inf-lat HK, Gr 1 DD, trivial MR  . Complete heart block, transient ([place])    a. 06/2017 in setting of acute inf MI -> resolved  after PCI.  Marland Kitchen COVID-19   . Diabetes mellitus   . Goiter   . History of radiation therapy 06/14/2016 - 07/19/2016   Right Breast 50 Gy 25 fractions  . Hypercholesteremia   . Hypertension   . Ischemic cardiomyopathy    a. EF 50% by cath and 40-45% by echo 06/2017.  . Leg pain    ABIs 07/2019: Normal (R 1.27; L 1.25)  . Malignant neoplasm of upper-outer quadrant of right female breast ([facility]) 02/24/2016  . Meningioma ([place])   . Nuclear stress test    Nuclear stress test 10/19: EF 35, inferolateral, apical scar; inferior, apical inferior infarct with mild peri-infarct ischemia; high risk  . Obesity   . Personal history of chemotherapy   . Personal history of radiation therapy   . Pituitary tumor   . Seasonal asthma   . Sleep apnea    does not use every night  . Stroke [facility])    TIA - Dec 1 st, 2017    Current Outpatient Medications on File Prior to Visit  Medication Sig Dispense Refill  . ADVAIR HFA 115-21 MCG/ACT inhaler Inhale 2 puffs into the lungs in the morning and at bedtime.    . Alirocumab (PRALUENT) 150 MG/ML SOAJ Inject 1 pen into the skin every 14 (fourteen) days. 2 mL 11  . ALPRAZOLAM PO Take 12.5 mg by mouth 2 (two) times daily as needed (anxiety attack).    Marland Kitchen aspirin EC 81 MG tablet Take 1 tablet (81 mg total) by mouth daily. 90 tablet 3  . carvedilol (COREG) 12.5 MG tablet Take 1.5 tablets (18.75 mg total) by mouth 2 (two) times daily. 270 tablet 3  . chlorthalidone (HYGROTON) 25 MG tablet Take 1 tablet (25 mg total) by mouth daily. 90 tablet 3  . Cholecalciferol (VITAMIN D-3 PO) Take 1 tablet by mouth daily.     . Continuous Blood Gluc Receiver (FREESTYLE LIBRE 2 READER) DEVI Use reader to monitor blood sugars continuously. 1 each 0  . Continuous Blood Gluc Sensor (FREESTYLE LIBRE 2 SENSOR) MISC USE 1 SENSOR ONCE EVERY 14  DAYS TO MONITOR BLOOD  SUGARS CONTINUOUSLY 5 each 1  . empagliflozin (JARDIANCE) 10 MG TABS tablet Take 1 tablet (10 mg total) by mouth daily before  breakfast. 30 tablet 11  . glucose blood (FREESTYLE TEST STRIPS) test strip Use as instructed 100 each 12  . INPEN 100-BLUE-LILLY DEVI CONSULT MD FOR APP SETTINGS. 1 each 0  . Insulin Disposable Pump (OMNIPOD DASH 5 PACK PODS) MISC Use one pod every 2 days 15 each 3  . Insulin Disposable Pump (OMNIPOD DASH SYSTEM) KIT Use to monitor blood sugar 1 kit 0  . insulin glargine (LANTUS SOLOSTAR) 100 UNIT/ML Solostar Pen Inject 70 Units into the skin daily. 30 mL 3  . insulin lispro (HUMALOG) 100 UNIT/ML cartridge Inject 0.03-0.25 mLs (3-25 Units total) into the  skin 3 (three) times daily with meals. Per sliding scale. 30 mL 3  . irbesartan (AVAPRO) 300 MG tablet Take 300 mg by mouth daily.    . MULTIPLE VITAMIN PO Take 1 tablet daily by mouth.    Marland Kitchen NIFEdipine (PROCARDIA-XL/NIFEDICAL-XL) 30 MG 24 hr tablet Take 1 tablet (30 mg total) by mouth daily. 30 tablet 1  . nitroGLYCERIN (NITROSTAT) 0.4 MG SL tablet DISSOLVE ONE TABLET UNDER TONGUE AS NEEDED FOR CHEST PAIN 25 tablet 4  . pantoprazole (PROTONIX) 40 MG tablet Take 40 mg by mouth daily.    . Semaglutide,0.25 or 0.5MG/DOS, (OZEMPIC, 0.25 OR 0.5 MG/DOSE,) 2 MG/1.5ML SOPN Inject 0.5 mg into the skin once a week. 2 mL 2  . valACYclovir (VALTREX) 1000 MG tablet Take 1,000 mg by mouth 2 (two) times daily.    Marland Kitchen venlafaxine XR (EFFEXOR-XR) 150 MG 24 hr capsule Take 225 mg by mouth daily.     No current facility-administered medications on file prior to visit.    Allergies  Allergen Reactions  . Hydrocodone Other (See Comments)    Severe anxiety Confusion   . Invokana [Canagliflozin] Other (See Comments)    Constant yeast infections  . Evolocumab     Injection site pain  . Hydralazine     Fatigue  . Imdur [Isosorbide Nitrate] Other (See Comments)    Patient reported wheezing  . Rosuvastatin     myalgias  . Simvastatin     myalgias  . Spironolactone     Wheezing vs fatigue?  Marland Kitchen Atorvastatin Other (See Comments)    DIZZINESS    There were  no vitals taken for this visit.   Assessment/Plan:  1. Hypertension/CHF - Blood pressure is above goal of <130/80 in clinic today. Home readings at goal. Blood pressure overall much better- especially since she is on the intended dose of irbesartan now. She is only taking 12.47m of chlorthalidone. I will continue this. She states the higher dose makes her legs hurt. I have asked her to take her carvedilol twice a day since it only last 12h. Her HR today was 100 (takes meds at night). I asked her to take twice a day, even if she needs to lower dose to 12.543mtwice a day. Continue irbesartan 30013maily. She will see ScoNicki Reaperxt week, virtually. Follow up in office in 7 weeks.    2. Medication compliance- patient seems to think all issues that arise are caused by her medications. She will frequently stop medications thinking they are causing issues. I wonder if this, along with anxiety, are playing into her difficult to control blood pressure. Patient states currently her anxiety is much better.   Thank you,  MelRamond Dialharm.D, BCPS, CPP [id number] [id number]: [phone]: [phone]

## 2020-08-26 LAB — FRUCTOSAMINE: Fructosamine: 335 umol/L — ABNORMAL HIGH (ref 0–285)

## 2020-08-27 NOTE — Progress Notes (Signed)
Kindly inform the patient that EEG study was normal

## 2020-08-30 ENCOUNTER — Telehealth: Payer: Self-pay | Admitting: *Deleted

## 2020-08-30 NOTE — Telephone Encounter (Signed)
-----   Message from Wyvonnia Lora, RN sent at 08/30/2020  3:43 PM EDT -----  ----- Message ----- From: Garvin Fila, MD Sent: 08/27/2020   9:53 AM EDT To: Baldomero Lamy, RN  Kindly inform the patient that EEG study was normal

## 2020-09-01 ENCOUNTER — Other Ambulatory Visit: Payer: Self-pay | Admitting: *Deleted

## 2020-09-01 ENCOUNTER — Encounter: Payer: Self-pay | Admitting: Physician Assistant

## 2020-09-01 ENCOUNTER — Other Ambulatory Visit: Payer: Self-pay

## 2020-09-01 ENCOUNTER — Telehealth (INDEPENDENT_AMBULATORY_CARE_PROVIDER_SITE_OTHER): Payer: 59 | Admitting: Physician Assistant

## 2020-09-01 ENCOUNTER — Encounter: Payer: Self-pay | Admitting: Endocrinology

## 2020-09-01 ENCOUNTER — Encounter: Payer: Self-pay | Admitting: *Deleted

## 2020-09-01 ENCOUNTER — Ambulatory Visit: Payer: 59 | Admitting: Endocrinology

## 2020-09-01 VITALS — BP 134/84 | HR 98 | Ht 70.0 in | Wt 277.6 lb

## 2020-09-01 VITALS — BP 131/83 | HR 99 | Ht 72.0 in | Wt 277.0 lb

## 2020-09-01 DIAGNOSIS — Z794 Long term (current) use of insulin: Secondary | ICD-10-CM

## 2020-09-01 DIAGNOSIS — I10 Essential (primary) hypertension: Secondary | ICD-10-CM

## 2020-09-01 DIAGNOSIS — I2510 Atherosclerotic heart disease of native coronary artery without angina pectoris: Secondary | ICD-10-CM

## 2020-09-01 DIAGNOSIS — I5023 Acute on chronic systolic (congestive) heart failure: Secondary | ICD-10-CM

## 2020-09-01 DIAGNOSIS — R0602 Shortness of breath: Secondary | ICD-10-CM

## 2020-09-01 DIAGNOSIS — E7800 Pure hypercholesterolemia, unspecified: Secondary | ICD-10-CM | POA: Diagnosis not present

## 2020-09-01 DIAGNOSIS — E1165 Type 2 diabetes mellitus with hyperglycemia: Secondary | ICD-10-CM | POA: Diagnosis not present

## 2020-09-01 MED ORDER — FUROSEMIDE 20 MG PO TABS: 20.0000 mg | ORAL_TABLET | Freq: Every day | ORAL | 3 refills | Status: DC

## 2020-09-01 MED ORDER — LANTUS SOLOSTAR 100 UNIT/ML ~~LOC~~ SOPN: 70.0000 [IU] | PEN_INJECTOR | Freq: Every day | SUBCUTANEOUS | 3 refills | Status: DC

## 2020-09-01 NOTE — Progress Notes (Signed)
Virtual Visit via Telephone Note   This visit type was conducted due to national recommendations for restrictions regarding the COVID-19 Pandemic (e.g. social distancing) in an effort to limit this patient's exposure and mitigate transmission in our community.  Due to her co-morbid illnesses, this patient is at least at moderate risk for complications without adequate follow up.  This format is felt to be most appropriate for this patient at this time.  The patient did not have access to video technology/had technical difficulties with video requiring transitioning to audio format only (telephone).  All issues noted in this document were discussed and addressed.  No physical exam could be performed with this format.  Please refer to the patient's chart for her  consent to telehealth for [facility].    Date:  09/01/2020   ID:  Destiny Beasley, DOB [birth year], MRN [id number] The patient was identified using 2 identifiers.  Patient Location: Home Provider Location: Office/Clinic   PCP:  Lawerance Cruel, MD   [facility] Cardiologist:  Sherren Mocha, MD Cardiology APP:  Liliane Shi, PA-C     Evaluation Performed:  Follow-Up Visit  Chief Complaint:  Follow-up (CAD)    Patient Profile: Destiny Beasley is a 65 y.o. female with:  Coronary artery disease  ? S/p inf-lat STEMI in 06/2017 c/b CGS, acute HFrEF, CHB >>PCI: DES to pLCx ? Myoview 10/19: inferior scar, mild peri-infarct ischemia  Chronic Systolic CHF ? Ischemic CM ? Echocardiogram 3/19: EF 40-45 ? Echocardiogram 11/19: EF 40-45 ? Echocardiogram 08/2018: EF 40-45  Diabetes mellitus   Prior TIA  OSA  Breast CA   Hypertension   Hyperlipidemia ? Intol of statins, Evolocumab   Hx of COVID-19 in 06/2019  Pituitary tumor, s/p resection  Meningioma   Prior cardiac studies: Echocardiogram 05/13/20 EF 40-45, inf and inf-lat HK, normal RVSF, trivial MR, trivial AI  Cardiac catheterization  05/13/20 LAD prox 40 LCx stent patent EF 45-50   ABIs 07/2019 Normal   Echocardiogram 09/05/2018 EF 40-45, inf-lat AK, mild LVH, Gr 1 DD, normal RVSF  Echo 03/07/18 Mild LVH, EF 40-45, GLS -11%, inf-lat HK, Gr 1 DD, trivial MR  Nuclear stress test10/22/19 EF 35, inferolateral, apical scar; inferior, apical inferior infarct with mild peri-infarct ischemia; high risk  Echo 06/26/17 EF 40-45, inferolateral hypokinesis, mild MR  Cardiac catheterization 06/25/17 LM luminal irregularities LAD luminal irregularities, proximal 30 LCx proximal 100 RCA irregularities EF 45-50, inferolateral akinesis PCI: 4 x 20 mm Synergy DES to the proximal LCx  Echo 03/25/16 Moderate LVH, EF 60-65, normal wall motion  Carotid US 12/17 Bilateral ICA 1-39   History of Present Illness:   Destiny Beasley was last seen in 7/21.  She was admitted in 1/22 with unstable angina.  Her hs-Trop were neg.  Cardiac catheterization demonstrated patent LCx stent and mild to mod dz in the LAD.  Med Rx was continued.  EF remained stable on f/u echocardiogram at 40-45.  She has been followed closely in the HTN clinic for her BP.  She is seen for f/u today.  She has not had chest pain, syncope, leg edema.  Her weight is up about 10 lbs.  She notes shortness of breath with some activities and has been having to sleep on an incline at night recently.  She uses BiPap for OSA.    Past Medical History:  Diagnosis Date  . Anemia   . Anxiety   . CAD in native artery    a.  Inf STEMI 12/5186 complicated by cardiogenic shock/complete heart block s/p emergent cath showing culprit large dominant LCx s/p aspiration thrombectomy and DES, EF 50% by cath, 40-45% by echo.  . Chronic systolic heart failure ([place]) 06/25/2017   Echo 11/19: mild LVH, EF 40-45, inf-lat HK, Gr 1 DD, trivial MR  . Complete heart block, transient ([place])    a. 06/2017 in setting of acute inf MI -> resolved after PCI.  Marland Kitchen COVID-19   . Diabetes mellitus   . Goiter    . History of radiation therapy 06/14/2016 - 07/19/2016   Right Breast 50 Gy 25 fractions  . Hypercholesteremia   . Hypertension   . Ischemic cardiomyopathy    a. EF 50% by cath and 40-45% by echo 06/2017.  . Leg pain    ABIs 07/2019: Normal (R 1.27; L 1.25)  . Malignant neoplasm of upper-outer quadrant of right female breast ([place]) 02/24/2016  . Meningioma ([place])   . Nuclear stress test    Nuclear stress test 10/19: EF 35, inferolateral, apical scar; inferior, apical inferior infarct with mild peri-infarct ischemia; high risk  . Obesity   . Personal history of chemotherapy   . Personal history of radiation therapy   . Pituitary tumor   . Seasonal asthma   . Sleep apnea    does not use every night  . Stroke [facility])    TIA - Dec 1 st, 2017   Past Surgical History:  Procedure Laterality Date  . ABDOMINAL HYSTERECTOMY    . BRAIN MENINGIOMA EXCISION  2005  . BREAST BIOPSY    . BREAST LUMPECTOMY Right    2017  . BREAST LUMPECTOMY WITH RADIOACTIVE SEED LOCALIZATION Right 04/20/2016   Procedure: RIGHT BREAST LUMPECTOMY WITH RADIOACTIVE SEED LOCALIZATION;  Surgeon: Fanny Skates, MD;  Location: [place];  Service: General;  Laterality: Right;  . BREAST REDUCTION SURGERY Bilateral 10/23/2016   Procedure: BILATERAL BREAST REDUCTION WITH LIPOSUCTION ASSISTANCE;  Surgeon: Cristine Polio, MD;  Location: [place];  Service: Plastics;  Laterality: Bilateral;  . CESAREAN SECTION    . CORONARY STENT INTERVENTION N/A 06/25/2017   Procedure: CORONARY STENT INTERVENTION;  Surgeon: Sherren Mocha, MD;  Location: [place] CV LAB;  Service: Cardiovascular;  Laterality: N/A;  . CORONARY/GRAFT ACUTE MI REVASCULARIZATION N/A 06/25/2017   Procedure: Coronary/Graft Acute MI Revascularization;  Surgeon: Sherren Mocha, MD;  Location: [place] CV LAB;  Service: Cardiovascular;  Laterality: N/A;  . FOOT SURGERY Bilateral 2016   hammer toe surgery  . LEFT HEART CATH AND  CORONARY ANGIOGRAPHY N/A 06/25/2017   Procedure: LEFT HEART CATH AND CORONARY ANGIOGRAPHY;  Surgeon: Sherren Mocha, MD;  Location: [place] [facility];  Service: Cardiovascular;  Laterality: N/A;  . LEFT HEART CATH AND CORONARY ANGIOGRAPHY N/A 05/13/2020   Procedure: LEFT HEART CATH AND CORONARY ANGIOGRAPHY;  Surgeon: Lorretta Harp, MD;  Location: [place] [facility];  Service: Cardiovascular;  Laterality: N/A;  . MENISCUS REPAIR Left 2015  . PITUITARY SURGERY    . REDUCTION MAMMAPLASTY    . RIGHT HEART CATH N/A 06/25/2017   Procedure: RIGHT HEART CATH;  Surgeon: Sherren Mocha, MD;  Location: [place] [facility];  Service: Cardiovascular;  Laterality: N/A;  . THYROID SURGERY       Current Meds  Medication Sig  . ADVAIR HFA 115-21 MCG/ACT inhaler Inhale 2 puffs into the lungs in the morning and at bedtime.  . Alirocumab (PRALUENT) 150 MG/ML SOAJ Inject 1 pen into the skin every 14 (  fourteen) days.  . ALPRAZOLAM PO Take 12.5 mg by mouth 2 (two) times daily as needed (anxiety attack).  Marland Kitchen aspirin EC 81 MG tablet Take 1 tablet (81 mg total) by mouth daily.  . carvedilol (COREG) 12.5 MG tablet Take 1 tablet by mouth daily.  . Cholecalciferol (VITAMIN D-3 PO) Take 1 tablet by mouth daily.   . Coenzyme Q10 100 MG capsule Take 1 capsule by mouth daily.  . Continuous Blood Gluc Receiver (FREESTYLE LIBRE 2 READER) DEVI Use reader to monitor blood sugars continuously.  . Continuous Blood Gluc Sensor (FREESTYLE LIBRE 2 SENSOR) MISC USE 1 SENSOR ONCE EVERY 14  DAYS TO MONITOR BLOOD  SUGARS CONTINUOUSLY  . furosemide (LASIX) 20 MG tablet Take 1 tablet (20 mg total) by mouth daily. For 3 days then take as need or fluid and swelling  . glucose blood (FREESTYLE TEST STRIPS) test strip Use as instructed  . INPEN 100-BLUE-LILLY DEVI CONSULT MD FOR APP SETTINGS.  Marland Kitchen insulin glargine (LANTUS SOLOSTAR) 100 UNIT/ML Solostar Pen Inject 70 Units into the skin daily.  . insulin lispro (HUMALOG) 100 UNIT/ML cartridge  Inject 0.03-0.25 mLs (3-25 Units total) into the skin 3 (three) times daily with meals. Per sliding scale.  . irbesartan (AVAPRO) 300 MG tablet Take 300 mg by mouth daily.  . MULTIPLE VITAMIN PO Take 1 tablet daily by mouth.  . nitroGLYCERIN (NITROSTAT) 0.4 MG SL tablet DISSOLVE ONE TABLET UNDER TONGUE AS NEEDED FOR CHEST PAIN  . pantoprazole (PROTONIX) 40 MG tablet Take 40 mg by mouth daily.  . Semaglutide,0.25 or 0.5MG /DOS, (OZEMPIC, 0.25 OR 0.5 MG/DOSE,) 2 MG/1.5ML SOPN Inject 0.5 mg into the skin once a week.  . sertraline (ZOLOFT) 50 MG tablet Take 0.5 tablets (25 mg total) by mouth daily.  . valACYclovir (VALTREX) 1000 MG tablet Take 1,000 mg by mouth 2 (two) times daily as needed.  . [DISCONTINUED] carvedilol (COREG) 12.5 MG tablet Take 1 tablet (12.5 mg total) by mouth 2 (two) times daily.     Allergies:   Hydrocodone, Invokana [canagliflozin], Evolocumab, Hydralazine, Imdur [isosorbide nitrate], Jardiance [empagliflozin], Rosuvastatin, Simvastatin, Spironolactone, and Atorvastatin   Social History   Tobacco Use  . Smoking status: Never Smoker  . Smokeless tobacco: Never Used  Vaping Use  . Vaping Use: Never used  Substance Use Topics  . Alcohol use: Yes    Alcohol/week: 1.0 standard drink    Types: 1 Glasses of wine per week    Comment: 1 glass monthly  . Drug use: No     Family Hx: The patient's family history includes Diabetes in her mother; Hyperlipidemia in her mother; Hypertension in her mother.  ROS:   Please see the history of present illness.    No coughing or wheezing.    Labs/Other Tests and Data Reviewed:    EKG:  No ECG reviewed.  Recent Labs: 05/13/2020: Hemoglobin 12.7; Platelets 311 07/26/2020: TSH 1.270 08/25/2020: ALT 27; BUN 34; Creatinine, Ser 1.16; Potassium 4.2; Sodium 138   Recent Lipid Panel Lab Results  Component Value Date/Time   CHOL 133 07/26/2020 10:16 AM   TRIG 200 (H) 07/26/2020 10:16 AM   HDL 50 07/26/2020 10:16 AM   CHOLHDL 2.7  07/26/2020 10:16 AM   CHOLHDL 2 08/12/2018 09:44 AM   LDLCALC 51 07/26/2020 10:16 AM   LDLDIRECT 67 07/15/2020 04:09 PM   LDLDIRECT 87.0 06/04/2019 08:35 AM    Wt Readings from Last 3 Encounters:  09/01/20 277 lb (125.6 kg)  09/01/20 277 lb 9.6 oz (  125.9 kg)  07/26/20 267 lb (121.1 kg)     Risk Assessment/Calculations:      Objective:    Vital Signs:  BP 131/83   Pulse 99   Ht 6' (1.829 m)   Wt 277 lb (125.6 kg)   BMI 37.57 kg/m    VITAL SIGNS:  reviewed GEN:  no acute distress PSYCH:  normal affect  ASSESSMENT & PLAN:    1. Acute on chronic HFmrEF (heart failure with mildly reduced EF) EF 40-45 by most recent echocardiogram.  Her ejection fraction has remained stable.  She has been intolerant of Entresto, empagliflozin, hydralazine, isosorbide and spironolactone.  She has noted a 10 pound weight gain and has had some increasing shortness of breath as well as what sounds like orthopnea.  I will give her Lasix 20 mg daily for the next 3 days.  She will come in tomorrow for BMET, BNP.  I will have her come back to follow-up in the next 4 weeks.  She has a follow-up with the hypertension clinic in about 6 weeks.  We will push that out for 3 months from now.  2. Coronary artery disease involving native coronary artery of native heart without angina pectoris History of inferolateral STEMI in March 2019 treated with DES to the LCx.   Myoview in 2019 did not demonstrate any significant ischemia.  Cardiac catheterization in 1/22 demonstrated patent LCx stent, mild-moderate disease in LAD and no significant disease in the RCA.  She has been managed medically.  She is doing well without anginal symptoms.  She is intolerant of statins.  Continue Alirocumab, aspirin, beta-blocker.     3. Essential hypertension The patient's blood pressure is controlled on her current regimen.  Continue current therapy.   4. Hypercholesterolemia LDL optimal on most recent lab work.  Continue current Rx.       Time:   Today, I have spent 12 minutes with the patient with telehealth technology discussing the above problems.     Medication Adjustments/Labs and Tests Ordered: Current medicines are reviewed at length with the patient today.  Concerns regarding medicines are outlined above.   Tests Ordered: Orders Placed This Encounter  Procedures  . Basic metabolic panel  . Pro b natriuretic peptide (BNP)    Medication Changes: Meds ordered this encounter  Medications  . furosemide (LASIX) 20 MG tablet    Sig: Take 1 tablet (20 mg total) by mouth daily. For 3 days then take as need or fluid and swelling    Dispense:  90 tablet    Refill:  3    Follow Up:  In Person in 4 week(s)  Signed, Richardson Dopp, PA-C  09/01/2020 4:37 PM    [facility]

## 2020-09-01 NOTE — Patient Instructions (Addendum)
Medication Instructions:  Your physician has recommended you make the following change in your medication:  1. Lasix 20 mg once daily x 3 days, then take once daily as needed. Weigh daily. When taking Lasix as needed, check for weight gain of > 3 lbs in 1 day.     *If you need a refill on your cardiac medications before your next appointment, please call your pharmacy*   Lab Work: To be done tomorrow 5/12: BMET, BNP If you have labs (blood work) drawn today and your tests are completely normal, you will receive your results only by: Marland Kitchen MyChart Message (if you have MyChart) OR . A paper copy in the mail If you have any lab test that is abnormal or we need to change your treatment, we will call you to review the results.   Testing/Procedures: NONE   Follow-Up: At [facility], you and your health needs are our priority.  As part of our continuing mission to provide you with exceptional heart care, we have created designated Provider Care Teams.  These Care Teams include your primary Cardiologist (physician) and Advanced Practice Providers (APPs -  Physician Assistants and Nurse Practitioners) who all work together to provide you with the care you need, when you need it.  We recommend signing up for the patient portal called "MyChart".  Sign up information is provided on this After Visit Summary.  MyChart is used to connect with patients for Virtual Visits (Telemedicine).  Patients are able to view lab/test results, encounter notes, upcoming appointments, etc.  Non-urgent messages can be sent to your provider as well.   To learn more about what you can do with MyChart, go to [email or web address].    Your next appointment:   4 week(s)  The format for your next appointment:   In Person  Provider:   You may see Sherren Mocha, MD or one of the following Advanced Practice Providers on your designated Care Team:    Richardson Dopp, [place]

## 2020-09-01 NOTE — Patient Instructions (Addendum)
Take 50 Lantus in am  Enter Carbs for all meals  Ozempic 0.5mg  weekly and after 4 weeks let us know

## 2020-09-01 NOTE — Progress Notes (Signed)
Patient ID: Destiny Beasley, female   DOB: 1955-12-19, 65 y.o.   MRN: [id number]   Reason for Appointment: Type II Diabetes follow-up   History of Present Illness    Date of diagnosis: 10/2008  Previous history: She had markedly increased blood sugars at diagnosis and was tried on oral hypoglycemic drugs and Victoza for about 9 months before starting an insulin.  Since 2011 she had been on basal bolus insulin regimen Previously had had difficulty controlling her diabetes mostly because of difficulty with compliance with various aspects of self-care.  Previous regimen: Toujeo 60 units at 8 am . Apidra 20 units at times  She was started on the Omnipod insulin pump on 09/11/16  Recent history:   Insulin regimen: HUMALOG U-100 with Inpen carbohydrate coverage 1: 16, sensitivity 1: 10 Lantus insulin 40-40 units pm daily  Non-insulin hypoglycemic drugs: Ozempic 0.25 mg weekly  Her A1c has been consistently high, now 9         Current management, blood sugar patterns and problems:  She was switched from Rybelsus to Farrell on her last visit  However she may have tried to increase her dose to 0.5 mg only after 1 dose of the 0.25 and she thinks she had some side effects and did not increase the dose  She has however switched her Lantus to twice a day as directed  Freestyle libre appears to be fairly accurate compared to the lab glucose  She has been trying to calculate her Humalog using the Inpen but now appears that she is mostly doing correction doses and only this morning she put in 30 g of carbohydrate for her breakfast  Also her settings are arbitrary on her own  Today her blood sugar was higher likely from not getting enough coverage for her breakfast  Has not verified that she is eligible to go back on the OmniPod this year with her insurance  She says her diet is quite variable and may be eating sometimes at night because of panic attacks  With this her blood  sugars are overall quite variable   GLUCOSE patterns/CGM interpretation is as follows:  Blood sugars are above target range on an average throughout the day and night  She has moderate variability but highest blood sugars are between 6-8 PM and lowest in 12-2 PM on average  No hypoglycemia  Recently she has tendency to postprandial spikes mostly in the late afternoons are evenings  Overnight blood sugars are mostly high but somewhat variable and only occasionally as low as 98   CGM data for the last 2 weeks from freestyle libre  CGM use % of time  91  2-week average/GV  213  Time in range    30    %  % Time Above 180  46+24  % Time above 250   % Time Below 70 0     PRE-MEAL Fasting Lunch Dinner Bedtime Overall  Glucose range:       Averages:  210  198  213  .  13   POST-MEAL PC Breakfast PC Lunch PC Dinner  Glucose range:     Averages:  224  207  234    Previous data:  CGM use % of time  95  2-week average/GV  187/25  Time in range       41%  % Time Above 180  50  % Time above 250 9  % Time Below 70 0  PRE-MEAL Fasting Lunch Dinner Bedtime Overall  Glucose range:       Averages:  150  190  172  230  187   POST-MEAL PC Breakfast PC Lunch PC Dinner  Glucose range:     Averages:  182  183  194  Side effects from medications: Candidiasis from Invokana   Previous data:  PRE-MEAL Fasting  11 AM Dinner Bedtime Overall  Glucose range:  97-273  100-316  212  192   Mean/median:      218   POST-MEAL PC Breakfast  2-3 PM PC Dinner  Glucose range:  ,  219-244   Mean/median:        Glycemic patterns summary: Overall blood sugars are highly variable and persistently high, on an average blood sugars are above 180 target throughout the day and night   Dietician visit: Most recent: 04/2010 Weight control:  Wt Readings from Last 3 Encounters:  09/01/20 277 lb 9.6 oz (125.9 kg)  07/26/20 267 lb (121.1 kg)  05/27/20 267 lb 9.6 oz (121.4 kg)         Diabetes  labs:  Lab Results  Component Value Date   HGBA1C 9.0 (H) 07/26/2020   HGBA1C 8.8 (H) 05/24/2020   HGBA1C 8.3 (H) 05/12/2020   Lab Results  Component Value Date   MICROALBUR <0.7 08/25/2020   [place] 51 07/26/2020   CREATININE 1.16 08/25/2020      No visits with results within 1 Day(s) from this visit.  Latest known visit with results is:  Lab on 08/25/2020  Component Date Value Ref Range Status  . Color, Urine 08/25/2020 YELLOW  Yellow;Lt. Yellow;Straw;Dark Yellow;Amber;Green;Red;Brown Final  . APPearance 08/25/2020 CLEAR  Clear;Turbid;Slightly Cloudy;Cloudy Final  . Specific Gravity, Urine 08/25/2020 1.015  1.000 - 1.030 Final  . pH 08/25/2020 5.5  5.0 - 8.0 Final  . Total Protein, Urine 08/25/2020 NEGATIVE  Negative Final  . Urine Glucose 08/25/2020 >=1000* Negative Final  . Ketones, ur 08/25/2020 NEGATIVE  Negative Final  . Bilirubin Urine 08/25/2020 NEGATIVE  Negative Final  . Hgb urine dipstick 08/25/2020 NEGATIVE  Negative Final  . Urobilinogen, UA 08/25/2020 0.2  0.0 - 1.0 Final  . Leukocytes,Ua 08/25/2020 NEGATIVE  Negative Final  . Nitrite 08/25/2020 NEGATIVE  Negative Final  . WBC, UA 08/25/2020 0-2/hpf  0-2/hpf Final  . RBC / HPF 08/25/2020 none seen  0-2/hpf Final  . Squamous Epithelial / LPF 08/25/2020 Rare(0-4/hpf)  Rare(0-4/hpf) Final  . Amorphous 08/25/2020 Present* None;Present Final  . Microalb, Ur 08/25/2020 <0.7  0.0 - 1.9 mg/dL Final  . Creatinine,U 08/25/2020 96.3  mg/dL Final  . Microalb Creat Ratio 08/25/2020 0.7  0.0 - 30.0 mg/g Final  . Sodium 08/25/2020 138  135 - 145 mEq/L Final  . Potassium 08/25/2020 4.2  3.5 - 5.1 mEq/L Final  . Chloride 08/25/2020 102  96 - 112 mEq/L Final  . CO2 08/25/2020 31  19 - 32 mEq/L Final  . Glucose, Bld 08/25/2020 219* 70 - 99 mg/dL Final  . BUN 08/25/2020 34* 6 - 23 mg/dL Final  . Creatinine, Ser 08/25/2020 1.16  0.40 - 1.20 mg/dL Final  . Total Bilirubin 08/25/2020 0.4  0.2 - 1.2 mg/dL Final  . Alkaline  Phosphatase 08/25/2020 95  39 - 117 U/L Final  . AST 08/25/2020 17  0 - 37 U/L Final  . ALT 08/25/2020 27  0 - 35 U/L Final  . Total Protein 08/25/2020 8.2  6.0 - 8.3 g/dL Final  . Albumin 08/25/2020 4.2  3.5 - 5.2 g/dL Final  . GFR 08/25/2020 50.50* >60.00 mL/min Final   Calculated using the CKD-EPI Creatinine Equation (2021)  . Calcium 08/25/2020 10.0  8.4 - 10.5 mg/dL Final  . Fructosamine 08/25/2020 335* 0 - 285 umol/L Final   Comment: Published reference interval for apparently healthy subjects between age 53 and 64 is 85 - 285 umol/L and in a poorly controlled diabetic population is 228 - 563 umol/L with a mean of 396 umol/L.      Allergies as of 09/01/2020      Reactions   Hydrocodone Other (See Comments)   Severe anxiety Confusion   Invokana [canagliflozin] Other (See Comments)   Constant yeast infections   Evolocumab    Injection site pain   Hydralazine    Fatigue   Imdur [isosorbide Nitrate] Other (See Comments)   Patient reported wheezing   Jardiance [empagliflozin] Other (See Comments)   lightheadedness, weakness, low BP and yeast infection.    Rosuvastatin    myalgias   Simvastatin    myalgias   Spironolactone    Wheezing vs fatigue?   Atorvastatin Other (See Comments)   DIZZINESS      Medication List       Accurate as of Sep 01, 2020 11:49 AM. If you have any questions, ask your nurse or doctor.        Advair HFA 115-21 MCG/ACT inhaler Generic drug: fluticasone-salmeterol Inhale 2 puffs into the lungs in the morning and at bedtime.   ALPRAZOLAM PO Take 12.5 mg by mouth 2 (two) times daily as needed (anxiety attack).   aspirin EC 81 MG tablet Take 1 tablet (81 mg total) by mouth daily.   carvedilol 12.5 MG tablet Commonly known as: COREG Take 1 tablet (12.5 mg total) by mouth 2 (two) times daily.   chlorthalidone 25 MG tablet Commonly known as: HYGROTON Take 0.5 tablets (12.5 mg total) by mouth daily.   FreeStyle Libre 2 Reader [place] Use  reader to monitor blood sugars continuously.   FreeStyle Libre 2 Sensor Misc USE 1 SENSOR ONCE EVERY 14  DAYS TO MONITOR BLOOD  SUGARS CONTINUOUSLY   FREESTYLE TEST STRIPS test strip Generic drug: glucose blood Use as instructed   HumaLOG 100 UNIT/ML cartridge Generic drug: insulin lispro Inject 0.03-0.25 mLs (3-25 Units total) into the skin 3 (three) times daily with meals. Per sliding scale.   InPen 100-Blue-Lilly Devi Generic drug: injection device for insulin CONSULT MD FOR APP SETTINGS.   irbesartan 300 MG tablet Commonly known as: AVAPRO Take 300 mg by mouth daily.   Lantus SoloStar 100 UNIT/ML Solostar Pen Generic drug: insulin glargine Inject 70 Units into the skin daily.   MULTIPLE VITAMIN PO Take 1 tablet daily by mouth.   nitroGLYCERIN 0.4 MG SL tablet Commonly known as: NITROSTAT DISSOLVE ONE TABLET UNDER TONGUE AS NEEDED FOR CHEST PAIN   Ozempic (0.25 or 0.5 MG/DOSE) 2 MG/1.5ML Sopn Generic drug: Semaglutide(0.25 or 0.5MG /DOS) Inject 0.5 mg into the skin once a week.   pantoprazole 40 MG tablet Commonly known as: PROTONIX Take 40 mg by mouth daily.   Praluent 150 MG/ML Soaj Generic drug: Alirocumab Inject 1 pen into the skin every 14 (fourteen) days.   sertraline 50 MG tablet Commonly known as: ZOLOFT Take 0.5 tablets (25 mg total) by mouth daily.   valACYclovir 1000 MG tablet Commonly known as: VALTREX Take 1,000 mg by mouth 2 (two) times daily.   venlafaxine XR 150 MG 24 hr capsule Commonly known as: EFFEXOR-XR  Take 225 mg by mouth daily.   VITAMIN D-3 PO Take 1 tablet by mouth daily.       Allergies:  Allergies  Allergen Reactions  . Hydrocodone Other (See Comments)    Severe anxiety Confusion   . Invokana [Canagliflozin] Other (See Comments)    Constant yeast infections  . Evolocumab     Injection site pain  . Hydralazine     Fatigue  . Imdur [Isosorbide Nitrate] Other (See Comments)    Patient reported wheezing  .  Jardiance [Empagliflozin] Other (See Comments)    lightheadedness, weakness, low BP and yeast infection.   . Rosuvastatin     myalgias  . Simvastatin     myalgias  . Spironolactone     Wheezing vs fatigue?  Marland Kitchen Atorvastatin Other (See Comments)    DIZZINESS    Past Medical History:  Diagnosis Date  . Anemia   . Anxiety   . CAD in native artery    a. Inf STEMI 123XX123 complicated by cardiogenic shock/complete heart block s/p emergent cath showing culprit large dominant LCx s/p aspiration thrombectomy and DES, EF 50% by cath, 40-45% by echo.  . Chronic systolic heart failure ([place]) 06/25/2017   Echo 11/19: mild LVH, EF 40-45, inf-lat HK, Gr 1 DD, trivial MR  . Complete heart block, transient ([place])    a. 06/2017 in setting of acute inf MI -> resolved after PCI.  Marland Kitchen COVID-19   . Diabetes mellitus   . Goiter   . History of radiation therapy 06/14/2016 - 07/19/2016   Right Breast 50 Gy 25 fractions  . Hypercholesteremia   . Hypertension   . Ischemic cardiomyopathy    a. EF 50% by cath and 40-45% by echo 06/2017.  . Leg pain    ABIs 07/2019: Normal (R 1.27; L 1.25)  . Malignant neoplasm of upper-outer quadrant of right female breast ([place]) 02/24/2016  . Meningioma ([place])   . Nuclear stress test    Nuclear stress test 10/19: EF 35, inferolateral, apical scar; inferior, apical inferior infarct with mild peri-infarct ischemia; high risk  . Obesity   . Personal history of chemotherapy   . Personal history of radiation therapy   . Pituitary tumor   . Seasonal asthma   . Sleep apnea    does not use every night  . Stroke [facility])    TIA - Dec 1 st, 2017    Past Surgical History:  Procedure Laterality Date  . ABDOMINAL HYSTERECTOMY    . BRAIN MENINGIOMA EXCISION  2005  . BREAST BIOPSY    . BREAST LUMPECTOMY Right    2017  . BREAST LUMPECTOMY WITH RADIOACTIVE SEED LOCALIZATION Right 04/20/2016   Procedure: RIGHT BREAST LUMPECTOMY WITH RADIOACTIVE SEED LOCALIZATION;  Surgeon: Fanny Skates,  MD;  Location: [facility];  Service: General;  Laterality: Right;  . BREAST REDUCTION SURGERY Bilateral 10/23/2016   Procedure: BILATERAL BREAST REDUCTION WITH LIPOSUCTION ASSISTANCE;  Surgeon: Cristine Polio, MD;  Location: [place];  Service: Plastics;  Laterality: Bilateral;  . CESAREAN SECTION    . CORONARY STENT INTERVENTION N/A 06/25/2017   Procedure: CORONARY STENT INTERVENTION;  Surgeon: Sherren Mocha, MD;  Location: [place] [facility];  Service: Cardiovascular;  Laterality: N/A;  . CORONARY/GRAFT ACUTE MI REVASCULARIZATION N/A 06/25/2017   Procedure: Coronary/Graft Acute MI Revascularization;  Surgeon: Sherren Mocha, MD;  Location: [place] [facility];  Service: Cardiovascular;  Laterality: N/A;  . FOOT SURGERY Bilateral 2016   hammer toe surgery  .  LEFT HEART CATH AND CORONARY ANGIOGRAPHY N/A 06/25/2017   Procedure: LEFT HEART CATH AND CORONARY ANGIOGRAPHY;  Surgeon: Sherren Mocha, MD;  Location: [place] [facility];  Service: Cardiovascular;  Laterality: N/A;  . LEFT HEART CATH AND CORONARY ANGIOGRAPHY N/A 05/13/2020   Procedure: LEFT HEART CATH AND CORONARY ANGIOGRAPHY;  Surgeon: Lorretta Harp, MD;  Location: [place] [facility];  Service: Cardiovascular;  Laterality: N/A;  . MENISCUS REPAIR Left 2015  . PITUITARY SURGERY    . REDUCTION MAMMAPLASTY    . RIGHT HEART CATH N/A 06/25/2017   Procedure: RIGHT HEART CATH;  Surgeon: Sherren Mocha, MD;  Location: [place] [facility];  Service: Cardiovascular;  Laterality: N/A;  . THYROID SURGERY      Family History  Problem Relation Age of Onset  . Diabetes Mother   . Hyperlipidemia Mother   . Hypertension Mother     Social History:  reports that she has never smoked. She has never used smokeless tobacco. She reports current alcohol use of about 1.0 standard drink of alcohol per week. She reports that she does not use drugs.  Review of Systems:  Hypertension:  Has been consistent control, blood  pressure is managed by cardiologist with readings as follows:  She is taking Avapro and hydrochlorothiazide as also Coreg  BP Readings from Last 3 Encounters:  09/01/20 134/84  08/25/20 136/82  08/04/20 (!) 164/88     Lipids: She has been on Praluent from cardiologist, could not tolerate Repatha  Followed by cardiology  She does have a history of MI  Lab Results  Component Value Date   CHOL 133 07/26/2020   HDL 50 07/26/2020   LDLCALC 51 07/26/2020   LDLDIRECT 67 07/15/2020   TRIG 200 (H) 07/26/2020   CHOLHDL 2.7 07/26/2020   Lab Results  Component Value Date   ALT 27 08/25/2020    Diabetic shoe prescription has been given previously      Examination:   BP 134/84   Pulse 98   Ht 5\' 10"  (1.778 m)   Wt 277 lb 9.6 oz (125.9 kg)   SpO2 99%   BMI 39.83 kg/m   Body mass index is 39.83 kg/m.     ASSESSMENT/ PLAN:    Diabetes type 2 with obesity, BMI more than 35  See history of present illness for detailed discussion of current management, interpretation of her continuous glucose monitoring record, blood sugar patterns and problems identified  Her A1c has usually been consistently high and now 9  Her insulin doses appear to be inadequate with her blood sugars averaging over 200 fairly consistently throughout the day and night However she does have variability in her sugar Although she is generally able to count carbohydrates she is not putting them in for her meals Discussed need for covering the food intake with carbohydrate coverage to be entered for her insulin doses and her plan Judging from today's readings blood sugars are not being covered adequately by her carbohydrate coverage of 1: 16 Also her sensitivity may be too much at 1: 10  With Lantus twice daily her blood sugars are about the same throughout the day and night but likely needs higher doses in the morning since occasionally her blood sugars come down overnight when she is doing better on her  diet She also needs to increase her Ozempic which likely she initially titrated to fast   Recommendations today:    She was advised to take 50 units Lantus in the morning and  40 in the evening  Carbohydrate coverage 1: 10  Correction factor I: 20 and she did make the changes in her apt today  She will look into the coverage for the OmniPod  Increase Ozempic  She will go up to 0.5 mg now and after 4 injections if tolerated she can call for the 1 mg prescription  Also consider Dexcom if covered  LIPIDS: To be followed by cardiologist  Hypertension: Blood pressure is high normal today   Patient Instructions  Take 50 Lantus in am  Enter Carbs for all meals  Ozempic 0.5mg  weekly and after 4 weeks let us know    Elayne Snare 09/01/2020, 11:49 AM   This visit occurred during the SARS-CoV-2 public health emergency.  Safety protocols were in place, including screening questions prior to the visit, additional usage of staff PPE, and extensive cleaning of exam room while observing appropriate contact time as indicated for disinfecting solutions.

## 2020-09-02 ENCOUNTER — Telehealth: Payer: Self-pay | Admitting: Physician Assistant

## 2020-09-02 ENCOUNTER — Other Ambulatory Visit: Payer: 59

## 2020-09-02 DIAGNOSIS — R0602 Shortness of breath: Secondary | ICD-10-CM

## 2020-09-02 DIAGNOSIS — I2510 Atherosclerotic heart disease of native coronary artery without angina pectoris: Secondary | ICD-10-CM

## 2020-09-02 DIAGNOSIS — I5023 Acute on chronic systolic (congestive) heart failure: Secondary | ICD-10-CM

## 2020-09-02 NOTE — Telephone Encounter (Signed)
HIM received Handicap paperwork. Paperwork was placed in Dr. Kathleen Argue box to be completed.  AO 09/02/20

## 2020-09-03 LAB — PRO B NATRIURETIC PEPTIDE: NT-Pro BNP: 318 pg/mL — ABNORMAL HIGH (ref 0–287)

## 2020-09-03 LAB — BASIC METABOLIC PANEL
BUN/Creatinine Ratio: 21 (ref 12–28)
BUN: 20 mg/dL (ref 8–27)
CO2: 23 mmol/L (ref 20–29)
Calcium: 9.4 mg/dL (ref 8.7–10.3)
Chloride: 102 mmol/L (ref 96–106)
Creatinine, Ser: 0.95 mg/dL (ref 0.57–1.00)
Glucose: 276 mg/dL — ABNORMAL HIGH (ref 65–99)
Potassium: 4.8 mmol/L (ref 3.5–5.2)
Sodium: 140 mmol/L (ref 134–144)
eGFR: 68 mL/min/{1.73_m2} (ref >59)

## 2020-09-03 NOTE — Telephone Encounter (Signed)
Called and spoke to patient to inform her that her Handicap form was ready to be picked up. Form was placed in an envelope and up at check-in. AO 09/03/20

## 2020-09-05 ENCOUNTER — Other Ambulatory Visit: Payer: Self-pay | Admitting: Endocrinology

## 2020-09-06 ENCOUNTER — Other Ambulatory Visit: Payer: Self-pay | Admitting: *Deleted

## 2020-09-06 MED ORDER — VICTOZA 18 MG/3ML ~~LOC~~ SOPN: PEN_INJECTOR | SUBCUTANEOUS | 3 refills | Status: DC

## 2020-09-07 ENCOUNTER — Other Ambulatory Visit: Payer: Self-pay | Admitting: Gastroenterology

## 2020-09-07 ENCOUNTER — Other Ambulatory Visit (HOSPITAL_COMMUNITY): Payer: Self-pay | Admitting: Gastroenterology

## 2020-09-07 DIAGNOSIS — K625 Hemorrhage of anus and rectum: Secondary | ICD-10-CM

## 2020-09-08 ENCOUNTER — Ambulatory Visit (HOSPITAL_COMMUNITY): Payer: 59

## 2020-09-08 ENCOUNTER — Other Ambulatory Visit: Payer: Self-pay | Admitting: *Deleted

## 2020-09-08 ENCOUNTER — Telehealth: Payer: Self-pay | Admitting: Nutrition

## 2020-09-08 MED ORDER — FUROSEMIDE 20 MG PO TABS: 20.0000 mg | ORAL_TABLET | Freq: Every day | ORAL | 1 refills | Status: DC

## 2020-09-08 NOTE — Telephone Encounter (Signed)
Message left on my machine that she has gone onto the [employer] and signed up to get a Dash pump.  She wanted to let us know this, to expect paperwork from them to order this for her.

## 2020-09-09 NOTE — Telephone Encounter (Signed)
Noted,  Thank you!

## 2020-09-15 ENCOUNTER — Ambulatory Visit (HOSPITAL_COMMUNITY): Payer: 59

## 2020-09-15 ENCOUNTER — Encounter (HOSPITAL_COMMUNITY): Payer: Self-pay

## 2020-09-27 DIAGNOSIS — E785 Hyperlipidemia, unspecified: Secondary | ICD-10-CM

## 2020-09-27 DIAGNOSIS — I252 Old myocardial infarction: Secondary | ICD-10-CM

## 2020-09-27 NOTE — Telephone Encounter (Signed)
Make sure pt is limiting salt Recheck BP after sitting for 15-20 mins with feet on the floor and arm at heart level. If BP still > 140/90, take an extra 6.25 mg of carvedilol.  Make sure pt is taking Carvedilol 12.5 mg twice daily.  Collect BP once daily for 1 week with above parameters and send readings via Mychart.  Richardson Dopp, PA-C    09/27/2020 5:26 PM

## 2020-09-28 ENCOUNTER — Encounter: Payer: Self-pay | Admitting: Pharmacist

## 2020-09-28 MED ORDER — PRALUENT 75 MG/ML ~~LOC~~ SOAJ: 1.0000 mL | SUBCUTANEOUS | 1 refills | Status: DC

## 2020-09-28 NOTE — Telephone Encounter (Signed)
Called the pt in response to her My Chart messsage and she reports that she had her Praluent this past Sunday and a few minutes later she developed redness all over her body, flushing, she felt like she was panicking... no hives or angioedema but felt like she could not breathe...she says it is different than panic attacks she had in the past and more like a reaction. She feels it is too strong for her. She has taken Benadryl Sunday and again last night and has improved quite a bit. I advised her to continue to monitor her symptoms and to let us know if anything changes but good she is improving. I will forward to the [facility].

## 2020-09-28 NOTE — Telephone Encounter (Signed)
Pt says she has had several doses and did well but after she had to increase the dose she had been having these same symptoms but not as bad as they were this past Sunday. She thought she was having "menopausal hot flashes" but never thought to associate them with her Praluent. She says the symptoms do not happen right away as they have this past event but close to the time she does use her Praluent. She thinks back now that it started when the dose was increased. Will forward back to the Pharmacist for review.

## 2020-09-28 NOTE — Telephone Encounter (Addendum)
Left a message for the pt to call back.   Pavero, Langdon, RPH  Stephani Police, RN Since patient was tolerating the lower dose Praluent 75mg , we can reduce dose. However if patient continues to have adverse effects, will likely have to discontinue.    *Pharmacist changed the dose*

## 2020-09-29 ENCOUNTER — Encounter (HOSPITAL_COMMUNITY): Payer: Self-pay

## 2020-09-29 ENCOUNTER — Ambulatory Visit (HOSPITAL_COMMUNITY)
Admission: RE | Admit: 2020-09-29 | Discharge: 2020-09-29 | Disposition: A | Discharge status: Home / Self Care | Payer: 59 | Source: Ambulatory Visit | Attending: Gastroenterology | Admitting: Gastroenterology

## 2020-09-29 ENCOUNTER — Other Ambulatory Visit: Payer: Self-pay

## 2020-09-29 DIAGNOSIS — K625 Hemorrhage of anus and rectum: Secondary | ICD-10-CM | POA: Diagnosis not present

## 2020-10-05 ENCOUNTER — Telehealth: Payer: Self-pay | Admitting: Endocrinology

## 2020-10-05 DIAGNOSIS — Z794 Long term (current) use of insulin: Secondary | ICD-10-CM

## 2020-10-05 DIAGNOSIS — E1165 Type 2 diabetes mellitus with hyperglycemia: Secondary | ICD-10-CM

## 2020-10-05 NOTE — Telephone Encounter (Signed)
RX REQUEST:  Freestyle [employer] 2  PHARMACY:  [facility]

## 2020-10-06 ENCOUNTER — Ambulatory Visit: Payer: 59 | Admitting: Neurology

## 2020-10-06 ENCOUNTER — Other Ambulatory Visit: Payer: Self-pay

## 2020-10-06 ENCOUNTER — Encounter: Payer: Self-pay | Admitting: Neurology

## 2020-10-06 ENCOUNTER — Other Ambulatory Visit: Payer: 59 | Admitting: *Deleted

## 2020-10-06 VITALS — BP 167/87 | HR 93 | Ht 72.0 in | Wt 270.0 lb

## 2020-10-06 DIAGNOSIS — F419 Anxiety disorder, unspecified: Secondary | ICD-10-CM

## 2020-10-06 DIAGNOSIS — R253 Fasciculation: Secondary | ICD-10-CM

## 2020-10-06 DIAGNOSIS — E7800 Pure hypercholesterolemia, unspecified: Secondary | ICD-10-CM

## 2020-10-06 LAB — HEPATIC FUNCTION PANEL
ALT: 24 IU/L (ref 0–32)
AST: 17 IU/L (ref 0–40)
Albumin: 4.2 g/dL (ref 3.8–4.8)
Alkaline Phosphatase: 104 IU/L (ref 44–121)
Bilirubin Total: <0.2 mg/dL (ref 0.0–1.2)
Bilirubin, Direct: <0.1 mg/dL (ref 0.00–0.40)
Total Protein: 7.1 g/dL (ref 6.0–8.5)

## 2020-10-06 LAB — LIPID PANEL
Chol/HDL Ratio: 2.2 ratio (ref 0.0–4.4)
Cholesterol, Total: 120 mg/dL (ref 100–199)
HDL: 54 mg/dL (ref >39)
LDL Chol Calc (NIH): 52 mg/dL (ref 0–99)
Triglycerides: 63 mg/dL (ref 0–149)
VLDL Cholesterol Cal: 14 mg/dL (ref 5–40)

## 2020-10-06 NOTE — Progress Notes (Signed)
[facility] [address] [place]. [place] [address] [phone]       OFFICE FOLLOW UP VISIT NOTE  Ms. CAYLEE VLACHOS Date of Birth:  10-05-1955 Medical Record Number:  [id number]   Referring MD: Lona Kettle  Reason for Referral: Eyelid twitching  HPI: Initial visit 07/26/2020:He is Ms. Sica is a 65 year old African-American lady seen today for initial office consultation visit for eyelid twitching and memory loss.  History is obtained from the patient and review of electronic medical records and personally reviewed pertinent available imaging films in PACS.  She has past medical history of diabetes, hypertension, hyperlipidemia, obstructive sleep apnea on CPAP, remote breast cancer s/p lumpectomy and radiation, right cerebellopontine angle meningioma s/p surgery in 2005 with small recurrence which has been stable.  Right hemispheric TIA in December 2017 with left-sided numbness and stable from neurovascular standpoint since then.  She states for the last few months she has noticed some involuntary twitching's of her eyelids particularly the right eye.  This occurs without warning.  May occur for a few moments and then stop and not occur for hours.  She has not noticed particular triggers or relieving factors for this.  She denies any history of eye infection of the burning sensation or dryness of her eyes.  She was seen by ophthalmologist but she is unable to tell me the name who did a thorough eye exam and found nothing wrong.  She denies any worsening of headaches gait or balance problems.  She does have mild gait imbalance which is residual from a brain tumor surgery but it is not getting progressively worse.  She does admit to anxiety and primary care physician recently changed her medications and given her some as needed Xanax which seems to help.  She is also on Effexor but has managed to reduce the dose recently.  She does complain of mild short-term memory difficulties  which seems to be getting worse.  Gotten lost while driving.  She is forgetful and even after writing things down has trouble keeping up with her appointments.  She has not had any evaluation for reversible causes of memory loss.  Her last CT scan of the head was on 11/16/2019 which showed stable appearance of 2 small residual right CP angle tumor with postop craniotomy changes.  She is not any recurrent TIA or stroke symptoms.  She remains on aspirin.  She states her blood pressure is under good control.  She has not had any recent lipid profile hemoglobin A1c checked. Update 10/06/2020: She returns for follow-up after last visit 2 months ago.  She states twitching's around the eyelids have gone.  She started doing stress laxation activities particularly deep breathing every day which seems to have helped.  She is also seeing a nutritionist and some brief and is doing naturopathy seems to have helped.  She had lab work at last visit on 07/26/2020 which showed normal vitamin B12, TSH, homocystine and RPR was negative.  EEG on 08/24/2020 was normal.  MRI scan of the brain done on 08/12/2020 showed mild changes of chronic small vessel disease.  Stable appearance of the right residual cerebellopontine angle meningioma and a small incidental Rathke's cyst was noted.  She has no new complaints and is doing well. ROS:   14 system review of systems is positive for gait ataxia, imbalance, twitchings of the eyelids, headache and all other systems negative  PMH:  Past Medical History:  Diagnosis Date   Anemia  Anxiety    CAD in native artery    a. Inf STEMI 08/6211 complicated by cardiogenic shock/complete heart block s/p emergent cath showing culprit large dominant LCx s/p aspiration thrombectomy and DES, EF 50% by cath, 40-45% by echo.   Chronic systolic heart failure ([place]) 06/25/2017   Echo 11/19: mild LVH, EF 40-45, inf-lat HK, Gr 1 DD, trivial MR   Complete heart block, transient ([place])    a. 06/2017 in setting of  acute inf MI -> resolved after PCI.   COVID-19    Diabetes mellitus    Goiter    History of radiation therapy 06/14/2016 - 07/19/2016   Right Breast 50 Gy 25 fractions   Hypercholesteremia    Hypertension    Ischemic cardiomyopathy    a. EF 50% by cath and 40-45% by echo 06/2017.   Leg pain    ABIs 07/2019: Normal (R 1.27; L 1.25)   Malignant neoplasm of upper-outer quadrant of right female breast ([place]) 02/24/2016   Meningioma ([place])    Nuclear stress test    Nuclear stress test 10/19: EF 35, inferolateral, apical scar; inferior, apical inferior infarct with mild peri-infarct ischemia; high risk   Obesity    Personal history of chemotherapy    Personal history of radiation therapy    Pituitary tumor    Seasonal asthma    Sleep apnea    does not use every night   Stroke [facility])    TIA - Dec 1 st, 2017    Social History:  Social History   Socioeconomic History   Marital status: Single    Spouse name: Not on file   Number of children: Not on file   Years of education: Not on file   Highest education level: Not on file  Occupational History   Occupation: full time  Tobacco Use   Smoking status: Never   Smokeless tobacco: Never  Vaping Use   Vaping Use: Never used  Substance and Sexual Activity   Alcohol use: Yes    Alcohol/week: 1.0 standard drink    Types: 1 Glasses of wine per week    Comment: 1 glass monthly   Drug use: No   Sexual activity: Not on file    Comment: no cycles; pt had hyst  Other Topics Concern   Not on file  Social History Narrative   Lives alone   Right Handed   Drinks 1-2 cups caffeine daily   Social Determinants of Health   Financial Resource Strain: Not on file  Food Insecurity: Not on file  Transportation Needs: Not on file  Physical Activity: Not on file  Stress: Not on file  Social Connections: Not on file  Intimate Partner Violence: Not on file    Medications:   Current Outpatient Medications on File Prior to Visit  Medication  Sig Dispense Refill   ADVAIR [facility] 115-21 MCG/ACT inhaler Inhale 2 puffs into the lungs in the morning and at bedtime.     Alirocumab (PRALUENT) 75 MG/ML SOAJ Inject 1 mL into the skin every 14 (fourteen) days. 6 mL 1   ALPRAZOLAM PO Take 12.5 mg by mouth 2 (two) times daily as needed (anxiety attack).     aspirin EC 81 MG tablet Take 1 tablet (81 mg total) by mouth daily. 90 tablet 3   carvedilol (COREG) 12.5 MG tablet Take 1 tablet by mouth daily.     Cholecalciferol (VITAMIN D-3 PO) Take 1 tablet by mouth daily.      Coenzyme Q10  100 MG capsule Take 1 capsule by mouth daily.     Continuous Blood Gluc Receiver (FREESTYLE LIBRE 2 READER) DEVI Use reader to monitor blood sugars continuously. 1 each 0   Continuous Blood Gluc Sensor (FREESTYLE LIBRE 2 SENSOR) MISC USE 1 SENSOR ONCE EVERY 14  DAYS TO MONITOR BLOOD  SUGARS CONTINUOUSLY 5 each 1   furosemide (LASIX) 20 MG tablet Take 1 tablet (20 mg total) by mouth daily. For 3 days then take as need or fluid and swelling 90 tablet 1   glucose blood (FREESTYLE TEST STRIPS) test strip Use as instructed 100 each 12   HUMALOG 100 UNIT/ML cartridge INECT 3-25 UNITS INTO THE SKIN THREE TIMES DAILY WITH MEALS. USE PER SLIDING SCALE 30 mL 3   INPEN 100-BLUE-LILLY DEVI CONSULT MD FOR APP SETTINGS. 1 each 0   insulin glargine (LANTUS SOLOSTAR) 100 UNIT/ML Solostar Pen Inject 70 Units into the skin daily. 30 mL 3   irbesartan (AVAPRO) 300 MG tablet Take 300 mg by mouth daily.     liraglutide (VICTOZA) 18 MG/3ML SOPN Inject 0.6 mg for 7 days, then increase to 1.2 mg daily 6 mL 3   sertraline (ZOLOFT) 50 MG tablet Take 0.5 tablets (25 mg total) by mouth daily.     valACYclovir (VALTREX) 1000 MG tablet Take 1,000 mg by mouth 2 (two) times daily as needed.     MULTIPLE VITAMIN PO Take 1 tablet daily by mouth.     nitroGLYCERIN (NITROSTAT) 0.4 MG SL tablet DISSOLVE ONE TABLET UNDER TONGUE AS NEEDED FOR CHEST PAIN 25 tablet 4   pantoprazole (PROTONIX) 40 MG tablet  Take 40 mg by mouth daily.     Semaglutide,0.25 or 0.5MG /DOS, (OZEMPIC, 0.25 OR 0.5 MG/DOSE,) 2 MG/1.5ML SOPN Inject 0.5 mg into the skin once a week. 2 mL 2   No current facility-administered medications on file prior to visit.    Allergies:   Allergies  Allergen Reactions   Hydrocodone Other (See Comments)    Severe anxiety Confusion    Invokana [Canagliflozin] Other (See Comments)    Constant yeast infections   Amlodipine     Muscles hurt   Carvedilol     Wheezing with 25mg  BID dosing   Chlorthalidone     Leg pain   Evolocumab     Injection site pain   Hydralazine     Fatigue   Imdur [Isosorbide Nitrate] Other (See Comments)    Patient reported wheezing   Jardiance [Empagliflozin] Other (See Comments)    lightheadedness, weakness, low BP and yeast infection.    Rosuvastatin     myalgias   Simvastatin     myalgias   Spironolactone     Wheezing vs fatigue?   Atorvastatin Other (See Comments)    DIZZINESS    Physical Exam General: Obese middle-aged African-American lady, seated, in no evident distress Head: head normocephalic and atraumatic.   Neck: supple with no carotid or supraclavicular bruits Cardiovascular: regular rate and rhythm, no murmurs Musculoskeletal: no deformity Skin:  no rash/petichiae Vascular:  Normal pulses all extremities  Neurologic Exam Mental Status: Awake and fully alert. Oriented to place and time. Recent and remote memory intact. Attention span, concentration and fund of knowledge appropriate. Mood and affect appropriate.    Cranial Nerves: Fundoscopic exam not done. Pupils equal, briskly reactive to light. Extraocular movements full without nystagmus.  She has mild intermittent blepharospasm right eye greater than left.  Mild ptosis on sustained upgaze but denies any diplopia.  Visual fields  full to confrontation. Hearing intact. Facial sensation intact. Face, tongue, palate moves normally and symmetrically.  Motor: Normal bulk and tone.  Normal strength in all tested extremity muscles. Sensory.: intact to touch , pinprick , position and vibratory sensation.  Coordination: Rapid alternating movements normal in all extremities. Finger-to-nose and heel-to-shin performed accurately bilaterally. Gait and Station: Arises from chair without difficulty. Stance is normal. Gait demonstrates normal stride length and balance .  Unable to do tandem walking and unsteady on either foot unsupported.. Reflexes: 1+ and symmetric. Toes downgoing.      ASSESSMENT: 65 year old African-American lady with a several months history of involuntary twitching of both eyelids likely from underlying anxiety which appears to have resolved.  She also has complaints of short-term memory difficulties which are likely due to mild cognitive impairment.  Remote history of right hemispheric TIA from small vessel disease in December 2017 and stable from neurovascular standpoint since then.  History of posterior fossa meningioma resection in 2015.     PLAN: I had a long discussion with the patient regarding her eyelid twitching's which appear to have resolved and may have been related to underlying anxiety.  I discussed the results of lab work, EEG and MRI scan of the brain.  I encouraged her to continue to participate in stress relaxation activities on regular basis.  Continue follow-up with Dr. Christella Noa for her CP angle meningioma.  No routine scheduled follow-up appointment with me is necessary. I also discussed memory compensation strategies with her and advised her to increase participation in cognitively challenging activities like solving crossword puzzles, playing bridge and sodoku.  She was advised to continue aspirin for stroke prevention and maintain aggressive risk factor modification with strict control of diabetes with hemoglobin A1c goal below 6.5, lipids with LDL cholesterol goal below 70 mg percent and hypertension and blood pressure goal below 130/90.  .   Greater than 50% time during this 25-minute  n visit was spent on counseling and coordination of care about her complaints of blepharospasm, mild cognitive impairment, history of TIA and meningioma surgery and answering questions.  Antony Contras MD  Note: This document was prepared with digital dictation and possible smart phrase technology. Any transcriptional errors that result from this process are unintentional.

## 2020-10-06 NOTE — Patient Instructions (Signed)
I had a long discussion with the patient regarding her eyelid twitching's which appear to have resolved and may have been related to underlying anxiety.  I discussed the results of lab work, EEG and MRI scan of the brain.  I encouraged her to continue to participate in stress relaxation activities on regular basis.  Continue follow-up with Dr. Christella Noa for her CP angle meningioma.  No routine scheduled follow-up appointment with me is necessary.

## 2020-10-07 MED ORDER — FREESTYLE LIBRE 2 SENSOR MISC: 1 refills | Status: DC

## 2020-10-07 NOTE — Progress Notes (Signed)
Cardiology Office Note:    Date:  10/08/2020   ID:  Destiny Beasley, DOB 23-May-1955, MRN [id number]  PCP:  Lawerance Cruel, MD   [facility] Providers Cardiologist:  Sherren Mocha, MD Cardiology APP:  Sharmon Revere      Referring MD: Lawerance Cruel, MD   Chief Complaint:  Follow-up (CHF, CAD)    Patient Profile:    Destiny Beasley is a 65 y.o. female with:  Coronary artery disease S/p inf-lat STEMI in 06/2017 c/b CGS, acute HFrEF, CHB >> PCI:  DES to pLCx Myoview 10/19: inferior scar, mild peri-infarct ischemia Cath 1/22: patent LCx stent; pLAD 40 HFmrEF (heart failure with mildly reduced ejection fraction)  Ischemic CM Echocardiogram 3/19: EF 40-45 Echocardiogram 11/19: EF 40-45 Echocardiogram 08/2018: EF 40-45 Echocardiogram 1/22: EF 40-45 Diabetes mellitus Prior TIA OSA Breast CA Hypertension Hyperlipidemia Intol of statins, Evolocumab Hx of COVID-19 in 06/2019 Pituitary tumor, s/p resection Meningioma    Prior cardiac studies: Echocardiogram 05/13/20 EF 40-45, inf and inf-lat HK, normal RVSF, trivial MR, trivial AI   Cardiac catheterization 05/13/20 LAD prox 40 LCx stent patent EF 45-50    ABIs 07/2019 Normal    Echocardiogram 09/05/2018 EF 40-45, inf-lat AK, mild LVH, Gr 1 DD, normal RVSF   Echo 03/07/18 Mild LVH, EF 40-45, GLS -11%, inf-lat HK, Gr 1 DD, trivial MR   Nuclear stress test 02/12/18 EF 35, inferolateral, apical scar; inferior, apical inferior infarct with mild peri-infarct ischemia; high risk   Echo 06/26/17 EF 40-45, inferolateral hypokinesis, mild MR   Cardiac catheterization 06/25/17 LM luminal irregularities LAD luminal irregularities, proximal 30 LCx proximal 100 RCA irregularities EF 45-50, inferolateral akinesis PCI: 4 x 20 mm Synergy DES to the proximal LCx   Echo 03/25/16 Moderate LVH, EF 60-65, normal wall motion   Carotid US 12/17 Bilateral ICA 1-39   History of Present Illness: Ms. Destiny Beasley was last seen  via Telemedicine in 5/22.  She had symptoms of volume excess. I started her on furosemide.  She returns for f/u.  She is here alone.  She has been experiencing hot flashes.  She attributes this to the carvedilol.  She only takes it once a day.  If she tries to take it twice a day, her hot flashes get worse.  She has not had chest pain.  Her breathing is overall improved.  She does get short of breath when she has hot flashes.  She has not had significant leg edema.  She has not had syncope, orthopnea.      As the patient was getting discharged in the office, she developed severe chest pain, shortness of breath.  She also complained of nausea.  She did not have a ripping or tearing sensation in her chest or back.  She began shivering as we were getting her EKG.  Her BP went up to 200/100.  Her EKG does not show anything acute. She is in normal sinus rhythm.  We did give her one of her Xanax due to increased anxiety.      Past Medical History:  Diagnosis Date   Anemia    Anxiety    CAD in native artery    a. Inf STEMI 06/7900 complicated by cardiogenic shock/complete heart block s/p emergent cath showing culprit large dominant LCx s/p aspiration thrombectomy and DES, EF 50% by cath, 40-45% by echo.   Chronic systolic heart failure ([place]) 06/25/2017   Echo 11/19: mild LVH, EF 40-45, inf-lat HK, Gr 1 DD,  trivial MR   Complete heart block, transient ([place])    a. 06/2017 in setting of acute inf MI -> resolved after PCI.   COVID-19    Diabetes mellitus    Goiter    History of radiation therapy 06/14/2016 - 07/19/2016   Right Breast 50 Gy 25 fractions   Hypercholesteremia    Hypertension    Ischemic cardiomyopathy    a. EF 50% by cath and 40-45% by echo 06/2017.   Leg pain    ABIs 07/2019: Normal (R 1.27; L 1.25)   Malignant neoplasm of upper-outer quadrant of right female breast ([place]) 02/24/2016   Meningioma [facility])    Nuclear stress test    Nuclear stress test 10/19: EF 35, inferolateral, apical scar;  inferior, apical inferior infarct with mild peri-infarct ischemia; high risk   Obesity    Personal history of chemotherapy    Personal history of radiation therapy    Pituitary tumor    Seasonal asthma    Sleep apnea    does not use every night   Stroke [facility])    TIA - Dec 1 st, 2017    Current Medications: Current Meds  Medication Sig   ADVAIR HFA 115-21 MCG/ACT inhaler Inhale 2 puffs into the lungs in the morning and at bedtime.   Alirocumab (PRALUENT) 75 MG/ML SOAJ Inject 1 mL into the skin every 14 (fourteen) days.   ALPRAZOLAM PO Take 12.5 mg by mouth 2 (two) times daily as needed (anxiety attack).   aspirin EC 81 MG tablet Take 1 tablet (81 mg total) by mouth daily.   Cholecalciferol (VITAMIN D-3 PO) Take 1 tablet by mouth daily.    Coenzyme Q10 100 MG capsule Take 1 capsule by mouth daily.   Continuous Blood Gluc Receiver (FREESTYLE LIBRE 2 READER) DEVI Use reader to monitor blood sugars continuously.   Continuous Blood Gluc Sensor (FREESTYLE LIBRE 2 SENSOR) MISC USE 1 SENSOR ONCE EVERY 14  DAYS TO MONITOR BLOOD  SUGARS CONTINUOUSLY   furosemide (LASIX) 20 MG tablet Take 1 tablet (20 mg total) by mouth daily. For 3 days then take as need or fluid and swelling   glucose blood (FREESTYLE TEST STRIPS) test strip Use as instructed   HUMALOG 100 UNIT/ML cartridge INECT 3-25 UNITS INTO THE SKIN THREE TIMES DAILY WITH MEALS. USE PER SLIDING SCALE   INPEN 100-BLUE-LILLY DEVI CONSULT MD FOR APP SETTINGS.   insulin glargine (LANTUS SOLOSTAR) 100 UNIT/ML Solostar Pen Inject 70 Units into the skin daily.   irbesartan (AVAPRO) 300 MG tablet Take 300 mg by mouth daily.   JARDIANCE 10 MG TABS tablet Take 10 mg by mouth daily.   liraglutide (VICTOZA) 18 MG/3ML SOPN Inject 0.6 mg for 7 days, then increase to 1.2 mg daily   metoprolol succinate (TOPROL-XL) 50 MG 24 hr tablet Take 1 tablet (50 mg total) by mouth daily. Take with or immediately following a meal.   nitroGLYCERIN (NITROSTAT) 0.4 MG  SL tablet DISSOLVE ONE TABLET UNDER TONGUE AS NEEDED FOR CHEST PAIN   sertraline (ZOLOFT) 50 MG tablet Take 0.5 tablets (25 mg total) by mouth daily.   valACYclovir (VALTREX) 1000 MG tablet Take 1,000 mg by mouth 2 (two) times daily as needed.   venlafaxine XR (EFFEXOR-XR) 37.5 MG 24 hr capsule Take 37.5 mg by mouth every other day.   [DISCONTINUED] carvedilol (COREG) 12.5 MG tablet Take 1 tablet by mouth daily.     Allergies:   Hydrocodone, Invokana [canagliflozin], Amlodipine, Carvedilol, Chlorthalidone, Evolocumab, Hydralazine, Imdur [  isosorbide nitrate], Jardiance [empagliflozin], Rosuvastatin, Simvastatin, Spironolactone, and Atorvastatin   Social History   Tobacco Use   Smoking status: Never   Smokeless tobacco: Never  Vaping Use   Vaping Use: Never used  Substance Use Topics   Alcohol use: Yes    Alcohol/week: 1.0 standard drink    Types: 1 Glasses of wine per week    Comment: 1 glass monthly   Drug use: No     Family Hx: The patient's family history includes Diabetes in her mother; Hyperlipidemia in her mother; Hypertension in her mother.  Review of Systems  Endocrine:       +hot flashes    EKGs/Labs/Other Test Reviewed:    EKG:  EKG is ordered today.  The ekg ordered today demonstrates NSR, HR 94, normal axis, inf Q waves, TW inversions in 3, aVF, V4-6, similar to old EKGs.  Recent Labs: 05/13/2020: Hemoglobin 12.7; Platelets 311 07/26/2020: TSH 1.270 09/02/2020: BUN 20; Creatinine, Ser 0.95; NT-Pro BNP 318; Potassium 4.8; Sodium 140 10/06/2020: ALT 24   Recent Lipid Panel Lab Results  Component Value Date/Time   CHOL 120 10/06/2020 07:43 AM   TRIG 63 10/06/2020 07:43 AM   HDL 54 10/06/2020 07:43 AM   LDLCALC 52 10/06/2020 07:43 AM   LDLDIRECT 67 07/15/2020 04:09 PM   LDLDIRECT 87.0 06/04/2019 08:35 AM   Labs obtained through [facility] - personally reviewed and interpreted: 07/26/20: A1c 9 08/25/2020: ALT 27 09/06/2020: Hgb 13.1 09/14/2020: TSH 1.85    Risk  Assessment/Calculations:      Physical Exam:    VS:  BP (!) 150/90 (BP Location: Left Arm, Patient Position: Sitting, Cuff Size: Large)   Pulse 98   Ht 6' (1.829 m)   Wt 271 lb (122.9 kg)   SpO2 98%   BMI 36.75 kg/m     Wt Readings from Last 3 Encounters:  10/08/20 270 lb 15.1 oz (122.9 kg)  10/08/20 271 lb (122.9 kg)  10/06/20 270 lb (122.5 kg)     Constitutional:      Appearance: Healthy appearance. Not in distress.  Neck:     Vascular: JVD normal.  Pulmonary:     Effort: Pulmonary effort is normal.     Breath sounds: No wheezing. No rales.  Cardiovascular:     Normal rate. Regular rhythm. Normal S1. Normal S2.      Murmurs: There is no murmur.  Edema:    Peripheral edema absent.  Abdominal:     Palpations: Abdomen is soft.  Skin:    General: Skin is warm and dry.  Neurological:     Mental Status: Alert and oriented to person, place and time.     Cranial Nerves: Cranial nerves are intact.        ASSESSMENT & PLAN:    1. Coronary artery disease involving native coronary artery of native heart with unstable angina pectoris History of inferolateral STEMI in March 2019 treated with DES to the LCx.   Myoview in 2019 did not demonstrate any significant ischemia.  Cardiac catheterization in 1/22 demonstrated patent LCx stent, mild-moderate disease in LAD and no significant disease in the RCA.  As noted, she developed severe chest pain, shortness of breath and nausea as she was being checked out.  Her pressure went up significantly.  She was shivering in the room.  Her electrocardiogram did not demonstrate anything acute.  We contacted EMS to transfer her to the emergency room quickly.  Given her blood pressure issues and sudden onset of  chest pain, I felt she needed to be transported to the ED quickly so that she can get work-up such as CTA to r/o dissection or PE, cardiac enzymes, etc.  I reviewed this with Dr. Quentin Ore (attending MD) who agreed.  If she needs admission or  consultation from cardiology, our team can be contacted in the emergency room  2. HFmrEF (heart failure with mildly reduced EF) EF 40-45.  Ischemic cardiomyopathy.  NYHA II.  Volume status appears stable.  She has had multiple intolerances to medications in the past.  She could not take Entresto and we had to switch her back to a irbesartan.  However, she is willing to try Entresto again.  She notes hot flashes that she attributes to the carvedilol and only takes it once a day.  We discussed the importance of taking beta-blocker, ARNI, mineralocorticoid receptor antagonist and SGLT2 inhibitor in treating heart failure.  She is currently on an SGLT2 inhibitor and a beta-blocker.  She could not tolerate spironolactone.  I will discontinue carvedilol and place her on metoprolol succinate 50 mg daily.  She will contact me in 2 weeks with blood pressure readings.  If her pressure is above goal, I will switch her irbesartan to Entresto 49/51 mg twice daily.  Follow-up with me in 3 months.  3. Essential hypertension Blood pressure is uncontrolled.  We discussed the importance of good blood pressure control to prevent heart disease and stroke.  Adjust medications as outlined.  4. Hypercholesterolemia LDL optimal on most recent lab work.  Continue current Rx.    5. Hot flashes I have asked her to follow-up with her endocrinologist to evaluate this further.  I do not think it is related to the medications.  However, I will change her beta-blocker as outlined above.  She had a recent TSH that was normal.  She went through menopause years ago.   Dispo:  Return in about 3 months (around 01/08/2021) for Routine Follow Up, w/ Richardson Dopp, PA-C.   Medication Adjustments/Labs and Tests Ordered: Current medicines are reviewed at length with the patient today.  Concerns regarding medicines are outlined above.  Tests Ordered: Orders Placed This Encounter  Procedures   EKG 12-Lead    Medication Changes: Meds  ordered this encounter  Medications   metoprolol succinate (TOPROL-XL) 50 MG 24 hr tablet    Sig: Take 1 tablet (50 mg total) by mouth daily. Take with or immediately following a meal.    Dispense:  90 tablet    Refill:  3     Signed, Richardson Dopp, PA-C  10/08/2020 10:44 AM    [facility] [place], [place], [place]  [address] Phone: [phone]; Fax: [phone]

## 2020-10-07 NOTE — Progress Notes (Signed)
Pt has been made aware of normal result and verbalized understanding.  jw

## 2020-10-07 NOTE — Telephone Encounter (Signed)
Rx sent to preferred pharmacy.

## 2020-10-08 ENCOUNTER — Encounter (HOSPITAL_COMMUNITY): Payer: Self-pay

## 2020-10-08 ENCOUNTER — Emergency Department (HOSPITAL_COMMUNITY): Payer: 59

## 2020-10-08 ENCOUNTER — Emergency Department (HOSPITAL_COMMUNITY)
Admission: EM | Admit: 2020-10-08 | Discharge: 2020-10-08 | Disposition: A | Discharge status: Home / Self Care | Payer: 59 | Attending: Emergency Medicine | Admitting: Emergency Medicine

## 2020-10-08 ENCOUNTER — Other Ambulatory Visit: Payer: Self-pay

## 2020-10-08 ENCOUNTER — Encounter: Payer: Self-pay | Admitting: Physician Assistant

## 2020-10-08 ENCOUNTER — Ambulatory Visit (INDEPENDENT_AMBULATORY_CARE_PROVIDER_SITE_OTHER): Payer: 59 | Admitting: Physician Assistant

## 2020-10-08 VITALS — BP 150/90 | HR 98 | Ht 72.0 in | Wt 271.0 lb

## 2020-10-08 DIAGNOSIS — E7800 Pure hypercholesterolemia, unspecified: Secondary | ICD-10-CM

## 2020-10-08 DIAGNOSIS — I2510 Atherosclerotic heart disease of native coronary artery without angina pectoris: Secondary | ICD-10-CM | POA: Insufficient documentation

## 2020-10-08 DIAGNOSIS — R232 Flushing: Secondary | ICD-10-CM

## 2020-10-08 DIAGNOSIS — I110 Hypertensive heart disease with heart failure: Secondary | ICD-10-CM | POA: Insufficient documentation

## 2020-10-08 DIAGNOSIS — Z79899 Other long term (current) drug therapy: Secondary | ICD-10-CM | POA: Insufficient documentation

## 2020-10-08 DIAGNOSIS — Z853 Personal history of malignant neoplasm of breast: Secondary | ICD-10-CM | POA: Insufficient documentation

## 2020-10-08 DIAGNOSIS — I10 Essential (primary) hypertension: Secondary | ICD-10-CM

## 2020-10-08 DIAGNOSIS — R0602 Shortness of breath: Secondary | ICD-10-CM | POA: Diagnosis present

## 2020-10-08 DIAGNOSIS — F419 Anxiety disorder, unspecified: Secondary | ICD-10-CM | POA: Diagnosis not present

## 2020-10-08 DIAGNOSIS — I5020 Unspecified systolic (congestive) heart failure: Secondary | ICD-10-CM

## 2020-10-08 DIAGNOSIS — Z7982 Long term (current) use of aspirin: Secondary | ICD-10-CM | POA: Diagnosis not present

## 2020-10-08 DIAGNOSIS — Z8616 Personal history of COVID-19: Secondary | ICD-10-CM | POA: Diagnosis not present

## 2020-10-08 DIAGNOSIS — E119 Type 2 diabetes mellitus without complications: Secondary | ICD-10-CM | POA: Diagnosis not present

## 2020-10-08 DIAGNOSIS — I25110 Atherosclerotic heart disease of native coronary artery with unstable angina pectoris: Secondary | ICD-10-CM | POA: Diagnosis not present

## 2020-10-08 DIAGNOSIS — Z794 Long term (current) use of insulin: Secondary | ICD-10-CM | POA: Insufficient documentation

## 2020-10-08 DIAGNOSIS — I5022 Chronic systolic (congestive) heart failure: Secondary | ICD-10-CM | POA: Insufficient documentation

## 2020-10-08 DIAGNOSIS — R0789 Other chest pain: Secondary | ICD-10-CM

## 2020-10-08 LAB — CBC
HCT: 41 % (ref 36.0–46.0)
Hemoglobin: 12.8 g/dL (ref 12.0–15.0)
MCH: 28.6 pg (ref 26.0–34.0)
MCHC: 31.2 g/dL (ref 30.0–36.0)
MCV: 91.5 fL (ref 80.0–100.0)
Platelets: 276 10*3/uL (ref 150–400)
RBC: 4.48 MIL/uL (ref 3.87–5.11)
RDW: 13.8 % (ref 11.5–15.5)
WBC: 5.8 10*3/uL (ref 4.0–10.5)
nRBC: 0 % (ref 0.0–0.2)

## 2020-10-08 LAB — BASIC METABOLIC PANEL
Anion gap: 5 (ref 5–15)
BUN: 11 mg/dL (ref 8–23)
CO2: 28 mmol/L (ref 22–32)
Calcium: 9 mg/dL (ref 8.9–10.3)
Chloride: 103 mmol/L (ref 98–111)
Creatinine, Ser: 0.91 mg/dL (ref 0.44–1.00)
GFR, Estimated: >60 mL/min (ref >60)
Glucose, Bld: 282 mg/dL — ABNORMAL HIGH (ref 70–99)
Potassium: 4.1 mmol/L (ref 3.5–5.1)
Sodium: 136 mmol/L (ref 135–145)

## 2020-10-08 LAB — D-DIMER, QUANTITATIVE: D-Dimer, Quant: 0.62 ug/mL-FEU — ABNORMAL HIGH (ref 0.00–0.50)

## 2020-10-08 LAB — TROPONIN I (HIGH SENSITIVITY)
Troponin I (High Sensitivity): 7 ng/L (ref <18)
Troponin I (High Sensitivity): 10 ng/L (ref <18)

## 2020-10-08 LAB — BRAIN NATRIURETIC PEPTIDE: B Natriuretic Peptide: 45.4 pg/mL (ref 0.0–100.0)

## 2020-10-08 LAB — TSH: TSH: 1.295 u[IU]/mL (ref 0.350–4.500)

## 2020-10-08 MED ORDER — METOPROLOL SUCCINATE ER 50 MG PO TB24: 50.0000 mg | ORAL_TABLET | Freq: Every day | ORAL | 3 refills | Status: DC

## 2020-10-08 NOTE — Patient Instructions (Signed)
Medication Instructions:   DISCONTINUE Carvedilol  START TOPROL XL one tablet by mouth ( 50 mg) daily.  *If you need a refill on your cardiac medications before your next appointment, please call your pharmacy*   Lab Work:  -None   If you have labs (blood work) drawn today and your tests are completely normal, you will receive your results only by: [place] (if you have MyChart) OR A paper copy in the mail If you have any lab test that is abnormal or we need to change your treatment, we will call you to review the results.   Testing/Procedures:  -None   Follow-Up: At [facility], you and your health needs are our priority.  As part of our continuing mission to provide you with exceptional heart care, we have created designated Provider Care Teams.  These Care Teams include your primary Cardiologist (physician) and Advanced Practice Providers (APPs -  Physician Assistants and Nurse Practitioners) who all work together to provide you with the care you need, when you need it.  We recommend signing up for the patient portal called "MyChart".  Sign up information is provided on this After Visit Summary.  MyChart is used to connect with patients for Virtual Visits (Telemedicine).  Patients are able to view lab/test results, encounter notes, upcoming appointments, etc.  Non-urgent messages can be sent to your provider as well.   To learn more about what you can do with MyChart, go to [email or web address].    Your next appointment:   3 month(s) on Wednesday, September 14 @ 11:15 am.  The format for your next appointment:   In Person  Provider:   Richardson Dopp, PA-C   Other Instructions   Please check blood pressure X 2 weeks and send readings in  to mychart or you can call office [phone].

## 2020-10-08 NOTE — ED Triage Notes (Signed)
Was at PCP for regular check up when the pt was leaving she started to have generalized non radiation CP  denies Sob.  Pain 5/10 after 1 SL nitro and 0.25 mg xanax

## 2020-10-08 NOTE — ED Provider Notes (Signed)
[facility] EMERGENCY DEPARTMENT Provider Note   CSN: [id number] Arrival date & time: 10/08/20  1017     History Chief Complaint  Patient presents with   Chest Pain    Destiny Beasley is a 65 y.o. female presenting for evaluation of chest pain, flushing, anxiety.   Pt states she was at the cardiologist's office for a check up, and just before d/c she had an episode of flushing, chest discomfort, sob, anxiety.  She was given nitro and Xanax, as noted, I am seeing her symptoms have completely resolved.  She reports frequent history of these episodes, more so in the past 4 months. She was recently diagnosed with hyperthyroidism, started medication 1-2 wks ago.  She denies recent fevers, chills, cough, nausea, vomiting abdominal pain, urinary symptoms, normal bowel movements.  She does have a history of anxiety, and states that when she develops these episodes, she becomes very anxious.  She takes Xanax as needed as well as Zoloft daily.  Additional history [facility] chart review.  History of anemia, anxiety, CAD, CHF, diabetes, hypertension, breast tumor status post lumpectomy, CVA.  Additional history obtained from cardiology note.  Per cardiology, due to patient sudden onset of symptoms, concern for cardiac cause versus PE.  However as patient has had the symptoms intermittently for several months, lower suspicion for PE.   HPI     Past Medical History:  Diagnosis Date   Anemia    Anxiety    CAD in native artery    a. Inf STEMI 08/7320 complicated by cardiogenic shock/complete heart block s/p emergent cath showing culprit large dominant LCx s/p aspiration thrombectomy and DES, EF 50% by cath, 40-45% by echo.   Chronic systolic heart failure ([place]) 06/25/2017   Echo 11/19: mild LVH, EF 40-45, inf-lat HK, Gr 1 DD, trivial MR   Complete heart block, transient ([place])    a. 06/2017 in setting of acute inf MI -> resolved after PCI.   COVID-19    Diabetes mellitus    Goiter     History of radiation therapy 06/14/2016 - 07/19/2016   Right Breast 50 Gy 25 fractions   Hypercholesteremia    Hypertension    Ischemic cardiomyopathy    a. EF 50% by cath and 40-45% by echo 06/2017.   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   Meningioma ([place])    Nuclear stress test    Nuclear stress test 10/19: EF 35, inferolateral, apical scar; inferior, apical inferior infarct with mild peri-infarct ischemia; high risk   Obesity    Personal history of chemotherapy    Personal history of radiation therapy    Pituitary tumor    Seasonal asthma    Sleep apnea    does not use every night   Stroke [facility])    TIA - Dec 1 st, 2017    Patient Active Problem List   Diagnosis Date Noted   Unstable angina ([place]) 05/12/2020   Chest pain with moderate risk for cardiac etiology 06/26/2019   History of TIA (transient ischemic attack) 08/08/2017   Malignant neoplasm of breast, stage 4 ([place]) 08/08/2017   History of complete heart block in setting of STEMI 06/2017 06/29/2017   OSA on CPAP 06/29/2017   CAD (coronary artery disease) 06/29/2017   Hx of Infero-Posterior ST Elevation MI in 06/2017 06/25/2017   History of cardiogenic shock in setting of STEMI  in 06/2017 85/46/2703   Chronic systolic heart failure ([place]) 06/25/2017   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   Type II diabetes mellitus, uncontrolled ([place]) 02/11/2014   Essential hypertension 09/07/2013   Atypical chest pain 11/11/2011   Anxiety 11/11/2011   Insulin dependent diabetes mellitus 11/11/2011   Hyperlipidemia LDL goal <70 11/11/2011    Past Surgical History:  Procedure Laterality Date   ABDOMINAL HYSTERECTOMY     BRAIN MENINGIOMA EXCISION  2005   BREAST BIOPSY     BREAST LUMPECTOMY Right    2017   BREAST LUMPECTOMY WITH RADIOACTIVE SEED LOCALIZATION Right 04/20/2016   Procedure:  RIGHT BREAST LUMPECTOMY WITH RADIOACTIVE SEED LOCALIZATION;  Surgeon: Fanny Skates, MD;  Location: [place];  Service: General;  Laterality: Right;   BREAST REDUCTION SURGERY Bilateral 10/23/2016   Procedure: BILATERAL BREAST REDUCTION WITH LIPOSUCTION ASSISTANCE;  Surgeon: Cristine Polio, MD;  Location: [place];  Service: Plastics;  Laterality: Bilateral;   CESAREAN SECTION     CORONARY STENT INTERVENTION N/A 06/25/2017   Procedure: CORONARY STENT INTERVENTION;  Surgeon: Sherren Mocha, MD;  Location: [place] CV LAB;  Service: Cardiovascular;  Laterality: N/A;   CORONARY/GRAFT ACUTE MI REVASCULARIZATION N/A 06/25/2017   Procedure: Coronary/Graft Acute MI Revascularization;  Surgeon: Sherren Mocha, MD;  Location: [place] CV [facility];  Service: Cardiovascular;  Laterality: N/A;   FOOT SURGERY Bilateral 2016   hammer toe surgery   LEFT HEART CATH AND CORONARY ANGIOGRAPHY N/A 06/25/2017   Procedure: LEFT HEART CATH AND CORONARY ANGIOGRAPHY;  Surgeon: Sherren Mocha, MD;  Location: [place] [facility];  Service: Cardiovascular;  Laterality: N/A;   LEFT HEART CATH AND CORONARY ANGIOGRAPHY N/A 05/13/2020   Procedure: LEFT HEART CATH AND CORONARY ANGIOGRAPHY;  Surgeon: Lorretta Harp, MD;  Location: [place] [facility];  Service: Cardiovascular;  Laterality: N/A;   MENISCUS REPAIR Left 2015   PITUITARY SURGERY     REDUCTION MAMMAPLASTY     RIGHT HEART CATH N/A 06/25/2017   Procedure: RIGHT HEART CATH;  Surgeon: Sherren Mocha, MD;  Location: [place] [facility];  Service: Cardiovascular;  Laterality: N/A;   THYROID SURGERY       OB History     Gravida  4   Para      Term      Preterm      AB      Living  2      SAB      IAB      Ectopic      Multiple      Live Births              Family History  Problem Relation Age of Onset   Diabetes Mother    Hyperlipidemia Mother    Hypertension Mother     Social History   Tobacco Use    Smoking status: Never   Smokeless tobacco: Never  Vaping Use   Vaping Use: Never used  Substance Use Topics   Alcohol use: Yes    Alcohol/week: 1.0 standard drink    Types: 1 Glasses of wine per week    Comment: 1 glass monthly   Drug use: No    Home Medications Prior to Admission medications   Medication Sig Start Date End Date Taking? Authorizing Provider  Alirocumab (PRALUENT) 75 MG/ML SOAJ Inject 1 mL into the skin every 14 (fourteen) days. 09/28/20  Yes Sherren Mocha, MD  ALPRAZOLAM PO Take 12.5  mg by mouth 2 (two) times daily as needed (anxiety attack).   Yes [provider]  aspirin EC 81 MG tablet Take 1 tablet (81 mg total) by mouth daily. 01/01/19  Yes Weaver, Scott T, PA-C  carvedilol (COREG) 25 MG tablet Take 12.5 mg by mouth at bedtime.   Yes [provider]  Coenzyme Q10 100 MG capsule Take 100 mg by mouth daily.   Yes [provider]  furosemide (LASIX) 20 MG tablet Take 1 tablet (20 mg total) by mouth daily. For 3 days then take as need or fluid and swelling Patient taking differently: Take 20 mg by mouth 3 (three) times daily as needed for fluid or edema. 09/08/20 12/07/20 Yes Weaver, Scott T, PA-C  HUMALOG 100 UNIT/ML cartridge INECT 3-25 UNITS INTO THE SKIN THREE TIMES DAILY WITH MEALS. USE PER SLIDING SCALE Patient taking differently: Inject 3-25 Units into the skin 3 (three) times daily with meals. Per sliding scale 09/06/20  Yes Elayne Snare, MD  insulin glargine (LANTUS SOLOSTAR) 100 UNIT/ML Solostar Pen Inject 70 Units into the skin daily. Patient taking differently: Inject 60 Units into the skin daily. 09/01/20  Yes Elayne Snare, MD  irbesartan (AVAPRO) 300 MG tablet Take 300 mg by mouth daily.   Yes [provider]  JARDIANCE 10 MG TABS tablet Take 10 mg by mouth daily. 10/07/20  Yes [provider]  liraglutide (VICTOZA) 18 MG/3ML SOPN Inject 0.6 mg for 7 days, then increase to 1.2 mg daily Patient taking differently: Inject  0.6-1.2 mg into the skin daily. 09/06/20  Yes Elayne Snare, MD  nitroGLYCERIN (NITROSTAT) 0.4 MG SL tablet DISSOLVE ONE TABLET UNDER TONGUE AS NEEDED FOR CHEST PAIN Patient taking differently: Place 0.4 mg under the tongue every 5 (five) minutes as needed for chest pain. 05/18/20  Yes Sherren Mocha, MD  Omega-3 Fatty Acids (FISH OIL OMEGA-3 PO) Take 1 tablet by mouth daily.   Yes [provider]  sertraline (ZOLOFT) 50 MG tablet Take 0.5 tablets (25 mg total) by mouth daily. 08/25/20  Yes Sherren Mocha, MD  valACYclovir (VALTREX) 1000 MG tablet Take 1,000 mg by mouth 2 (two) times daily as needed (infection). 05/02/20  Yes [provider]  venlafaxine XR (EFFEXOR-XR) 37.5 MG 24 hr capsule Take 37.5 mg by mouth every other day.   Yes [provider]  Continuous Blood Gluc Receiver (FREESTYLE LIBRE 2 READER) DEVI Use reader to monitor blood sugars continuously. 03/23/20   Elayne Snare, MD  Continuous Blood Gluc Sensor (FREESTYLE LIBRE 2 SENSOR) MISC USE 1 SENSOR ONCE EVERY 14  DAYS TO MONITOR BLOOD  SUGARS CONTINUOUSLY 10/07/20   Elayne Snare, MD  glucose blood (FREESTYLE TEST STRIPS) test strip Use as instructed 06/10/20   Elayne Snare, MD  INPEN 100-BLUE-LILLY DEVI CONSULT MD FOR APP SETTINGS. 06/10/20   Elayne Snare, MD  metoprolol succinate (TOPROL-XL) 50 MG 24 hr tablet Take 1 tablet (50 mg total) by mouth daily. Take with or immediately following a meal. 10/08/20 01/06/21  Richardson Dopp T, PA-C    Allergies    Hydrocodone, Invokana [canagliflozin], Amlodipine, Carvedilol, Chlorthalidone, Evolocumab, Hydralazine, Imdur [isosorbide nitrate], Jardiance [empagliflozin], Rosuvastatin, Simvastatin, Spironolactone, and Atorvastatin  Review of Systems   Review of Systems  Constitutional:        Flushing feeling  Cardiovascular:  Positive for chest pain.  Psychiatric/Behavioral:  The patient is nervous/anxious.   All other systems reviewed and are negative.  Physical Exam Updated  Vital Signs BP (!) 163/81  Pulse 79   Temp 98 F (36.7 C) (Oral)   Resp 13   Ht 6' (1.829 m)   Wt 122.9 kg   SpO2 100%   BMI 36.75 kg/m   Physical Exam Vitals and nursing note reviewed.  Constitutional:      General: She is not in acute distress.    Appearance: Normal appearance. She is obese.     Comments: Resting in bed in no acute distress  HENT:     Head: Normocephalic and atraumatic.  Eyes:     Conjunctiva/sclera: Conjunctivae normal.     Pupils: Pupils are equal, round, and reactive to light.  Cardiovascular:     Rate and Rhythm: Normal rate and regular rhythm.     Pulses: Normal pulses.  Pulmonary:     Effort: Pulmonary effort is normal. No respiratory distress.     Breath sounds: Normal breath sounds. No wheezing.     Comments: Speaking in full sentences.  Clear lung sounds in all fields. Abdominal:     General: There is no distension.     Palpations: Abdomen is soft. There is no mass.     Tenderness: There is no abdominal tenderness. There is no guarding or rebound.  Musculoskeletal:        General: Normal range of motion.     Cervical back: Normal range of motion and neck supple.  Skin:    General: Skin is warm and dry.     Capillary Refill: Capillary refill takes less than 2 seconds.  Neurological:     Mental Status: She is alert and oriented to person, place, and time.  Psychiatric:        Mood and Affect: Affect normal. Mood is anxious.        Speech: Speech normal.        Behavior: Behavior normal.    ED Results / Procedures / Treatments   Labs (all labs ordered are listed, but only abnormal results are displayed) Labs Reviewed  BASIC METABOLIC PANEL - Abnormal; Notable for the following components:      Result Value   Glucose, Bld 282 (*)    All other components within normal limits  D-DIMER, QUANTITATIVE - Abnormal; Notable for the following components:   D-Dimer, Quant 0.62 (*)    All other components within normal limits  CBC  TSH  BRAIN  NATRIURETIC PEPTIDE  TROPONIN I (HIGH SENSITIVITY)  TROPONIN I (HIGH SENSITIVITY)    EKG EKG Interpretation  Date/Time:  Friday October 08 2020 10:21:23 EDT Ventricular Rate:  86 PR Interval:  190 QRS Duration: 87 QT Interval:  377 QTC Calculation: 451 R Axis:   53 Text Interpretation: Sinus rhythm Inferolateral infarct, old No significant change since last tracing Confirmed by Blanchie Dessert [phone]) on 10/08/2020 11:37:22 AM  Radiology DG Chest 2 View  Result Date: 10/08/2020 CLINICAL DATA:  Nonradiating chest pain. EXAM: CHEST - 2 VIEW COMPARISON:  Radiographs 05/12/2020.  CT 06/26/2019. FINDINGS: The heart size and mediastinal contours are normal. The lungs are clear. There is no pleural effusion or pneumothorax. No acute osseous findings are identified. Telemetry leads overlie the chest. IMPRESSION: Stable chest.  No active cardiopulmonary process. Electronically Signed   By: Richardean Sale M.D.   On: 10/08/2020 11:10   CT Head Wo Contrast  Result Date: 10/08/2020 CLINICAL DATA:  History of pituitary surgery. History of meningioma resection in the posterior fossa. Mental status change EXAM: CT HEAD WITHOUT CONTRAST TECHNIQUE: Contiguous axial images were obtained from the  base of the skull through the vertex without intravenous contrast. COMPARISON:  CT head 11/16/2019.  MRI head 08/12/2020 FINDINGS: Brain: Right occipital craniotomy for resection of meningioma in the right cerebellar pontine angle cistern. Residual enhancing tumor is best identified on the MRI. There is hyperostosis of the petrous ridge on the right. No brain edema or significant mass effect. Encephalomalacia right lateral cerebellum unchanged. 8 x 10 mm lesion in the sella extending to the suprasellar cistern. 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 No interval change. Ventricle size normal.  No acute infarct or hemorrhage. Vascular: Negative for hyperdense vessel Skull: Right  occipital craniotomy. Sinuses/Orbits: Mild mucosal edema paranasal sinuses. Negative orbit Other: None IMPRESSION: No acute abnormality no change from prior study Right occipital craniotomy for meningioma resection. Residual meningioma along the right petrous ridge best seen on MRI 8 x 10 mm lesion in the sella and suprasellar cistern unchanged from prior MRI. Electronically Signed   By: Franchot Gallo M.D.   On: 10/08/2020 12:45    Procedures Procedures   Medications Ordered in ED Medications - No data to display  ED Course  I have reviewed the triage vital signs and the nursing notes.  Pertinent labs & imaging results that were available during my care of the patient were reviewed by me and considered in my medical decision making (see chart for details).    MDM Rules/Calculators/A&P                          Patient presenting for evaluation of an episode of flushing, chest pain, anxiety, shortness of breath.  On my evaluation, patient is symptom-free.  She appears nontoxic.  Consider electrolyte abnormality, anemia, ACS, hyperthyroidism.  Less likely PE as patient has had intermittent episodes, however due to cardiology concern, obtain D-dimer.  Will obtain CT head to ensure no masses patient has a history of meningioma.  Chest x-ray ordered to rule out infection.  EKG unchanged from previous, nonischemic.  CT head negative for acute findings, does show unchanged residual lesion.  Troponins negative x2.  BNP negative.  CBC and BMP showed stable electrolytes and hemoglobin.  D-dimer is minimally elevated at 0.62, however when age-adjusted, this is negative.  As I have very low suspicion for PE, will not pursue CTA.  TSH is normal.  On reevaluation, patient remains asymptomatic. Discussed findings with pt. Discussed plan to f/u with PCP. At this time, pt appears safe for d/c. Return precautions given. Pt states she understands and agrees to plan.  Final Clinical Impression(s) / ED  Diagnoses Final diagnoses:  Atypical chest pain  Flushing reaction  Anxiety    Rx / DC Orders ED Discharge Orders     None        Franchot Heidelberg, PA-C 10/08/20 1532    Blanchie Dessert, MD 10/09/20 1059

## 2020-10-08 NOTE — ED Notes (Signed)
Patient transported to X-ray 

## 2020-10-08 NOTE — Discharge Instructions (Addendum)
Your work-up today was overall reassuring.  Your heart did not show any signs of injury.  Your work-up for blood clot was negative.  Your labs are overall reassuring and that you had no anemia and your overall thyroid function today was normal Continue taking home medications as prescribed. Call your primary care doctor today to set up a follow-up appointment for further evaluation of these episodes. Return to the emergency room if you develop persistent chest pain, difficulty breathing, high fevers, or any new, worsening, or concerning symptoms.

## 2020-10-11 ENCOUNTER — Telehealth: Payer: Self-pay | Admitting: Cardiovascular Disease

## 2020-10-11 ENCOUNTER — Other Ambulatory Visit: Payer: Self-pay | Admitting: Endocrinology

## 2020-10-11 DIAGNOSIS — Z794 Long term (current) use of insulin: Secondary | ICD-10-CM

## 2020-10-11 DIAGNOSIS — E1165 Type 2 diabetes mellitus with hyperglycemia: Secondary | ICD-10-CM

## 2020-10-11 NOTE — Telephone Encounter (Signed)
Pt at low cardiac risk of holding aspirin 5-7 days prior to surgery at discretion of the surgeon - ok to do so from my perspective. thanks

## 2020-10-11 NOTE — Telephone Encounter (Signed)
Pre-op covering staff, there is not a fax number listed. Can we get this number so we can fax the form back to them?  Thank you so much!

## 2020-10-11 NOTE — Telephone Encounter (Signed)
   [facility] Pre-operative Risk Assessment    Patient Name: Destiny Beasley  DOB: 1955/06/26  MRN: [id number]   HEARTCARE STAFF: - Please ensure there is not already an duplicate clearance open for this procedure. - Under Visit Info/Reason for Call, type in Other and utilize the format Clearance MM/DD/YY or Clearance TBD. Do not use dashes or single digits. - If request is for dental extraction, please clarify the # of teeth to be extracted. - If the patient is currently at the dentist's office, call Pre-Op APP to address. If the patient is not currently in the dentist office, please route to the Pre-Op pool  Request for surgical clearance:  What type of surgery is being performed? Unilateral Mammary implant placed  When is this surgery scheduled? 10/12/2020  What type of clearance is required (medical clearance vs. Pharmacy clearance to hold med vs. Both)? Both  Are there any medications that need to be held prior to surgery and how long? Any blood thinners, 2 weeks at least  Practice name and name of physician performing surgery? [facility], Dr. Bea Graff  What is the office phone number? Dr. Sueanne Margarita office: [phone], [place]: [phone]   7.   What is the office fax number? Does not know fax number for [facility]  8.   Anesthesia type (None, local, MAC, general) ? General    Selena Zobro 10/11/2020, 9:02 AM  _________________________________________________________________   (provider comments below)

## 2020-10-11 NOTE — Telephone Encounter (Signed)
FAX # [phone]

## 2020-10-11 NOTE — Telephone Encounter (Signed)
   Name:  Destiny Beasley  DOB:  1955/10/27  MRN:  [id number]   Primary Cardiologist: Sherren Mocha, MD  Chart reviewed as part of pre-operative protocol coverage. Patient has a history of CAD with STMI in 2019 s/p DES to proximal LCX. Most recent LHC in 04/2020 showed patent LCX stent with mild non-obstructive LAD disease. Patient was seen in our office last week on 10/08/2020 and had to be sent to the ED from the office due to sudden onset of severe chest pain with associated BP of 200/100. There was concern for dissection or PE and therefore MS was called. In the ED, high-sensitivity troponin was negative x2. D-dimer was minimally elevated at 0.62 but negative when adjusted for age. Therefore, chest CTA was not ordered. Head CT was unremarkable. She was pain free in the ED and was therefore felt to be safe for discharge. I called patient today to follow-up and she denied any recurrent chest pain. No shortness of breath, acute CHF symptoms, significant palpitations, or syncope. Patient would like to postpone surgery and follow-up in our office to make sure her BP is better controlled and to make sure she has not had any recurrent symptoms. I also think this is a good idea given this is just an elective procedure. Therefore, recommend postponing procedure for now until patient can be seen for follow-up. Patient stated she would call surgeon's office to cancel procedure which is currently scheduled for tomorrow.   Pre-op covering staff: - Please schedule appointment and call patient to inform them. If patient already had an upcoming appointment within acceptable timeframe, please add "pre-op clearance" to the appointment notes so provider is aware.  Will also route note to Dr. Burt Knack for his recommendations on holding Aspirin prior to procedure so these recommendations are available at follow-up visit. Dr. Burt Knack, please route response back to P CV DIV PREOP.  Thank you!  Darreld Mclean,  PA-C 10/11/2020, 10:35 AM

## 2020-10-11 NOTE — Telephone Encounter (Signed)
NOTES HAVE BEEN FAXED TO SURGEON'S OFFICE

## 2020-10-13 ENCOUNTER — Ambulatory Visit: Payer: 59

## 2020-10-18 NOTE — Telephone Encounter (Signed)
Her blood pressure is too high. We should try to get her back on Entresto.  She noted that she would be willing to try this again at my last visit with her.  She really needs to stay on the Metoprolol PLAN:  Change Metoprolol succinate to daily at bedtime. Carvedilol is still on her list - please remove (she is not taking this anymore) Stop Irbesartan Start Entresto 49/51 mg twice daily  She has an appt with the HTN clinic in August.  Please see if she can be seen in the next 2 weeks instead. BMET 2 weeks.  Richardson Dopp, PA-C    10/18/2020 4:21 PM

## 2020-10-19 ENCOUNTER — Encounter (HOSPITAL_COMMUNITY): Payer: Self-pay | Admitting: Emergency Medicine

## 2020-10-19 ENCOUNTER — Emergency Department (HOSPITAL_COMMUNITY)
Admission: EM | Admit: 2020-10-19 | Discharge: 2020-10-19 | Disposition: A | Discharge status: Home / Self Care | Payer: 59 | Attending: Emergency Medicine | Admitting: Emergency Medicine

## 2020-10-19 ENCOUNTER — Emergency Department (HOSPITAL_COMMUNITY): Payer: 59

## 2020-10-19 ENCOUNTER — Other Ambulatory Visit: Payer: Self-pay

## 2020-10-19 DIAGNOSIS — I2510 Atherosclerotic heart disease of native coronary artery without angina pectoris: Secondary | ICD-10-CM | POA: Diagnosis not present

## 2020-10-19 DIAGNOSIS — R059 Cough, unspecified: Secondary | ICD-10-CM | POA: Diagnosis present

## 2020-10-19 DIAGNOSIS — Z79899 Other long term (current) drug therapy: Secondary | ICD-10-CM | POA: Diagnosis not present

## 2020-10-19 DIAGNOSIS — Z7984 Long term (current) use of oral hypoglycemic drugs: Secondary | ICD-10-CM | POA: Insufficient documentation

## 2020-10-19 DIAGNOSIS — Z7982 Long term (current) use of aspirin: Secondary | ICD-10-CM | POA: Diagnosis not present

## 2020-10-19 DIAGNOSIS — Z955 Presence of coronary angioplasty implant and graft: Secondary | ICD-10-CM | POA: Insufficient documentation

## 2020-10-19 DIAGNOSIS — I5022 Chronic systolic (congestive) heart failure: Secondary | ICD-10-CM | POA: Diagnosis not present

## 2020-10-19 DIAGNOSIS — R000 Tachycardia, unspecified: Secondary | ICD-10-CM | POA: Diagnosis not present

## 2020-10-19 DIAGNOSIS — U071 COVID-19: Secondary | ICD-10-CM

## 2020-10-19 DIAGNOSIS — I110 Hypertensive heart disease with heart failure: Secondary | ICD-10-CM | POA: Diagnosis not present

## 2020-10-19 DIAGNOSIS — Z8673 Personal history of transient ischemic attack (TIA), and cerebral infarction without residual deficits: Secondary | ICD-10-CM | POA: Diagnosis not present

## 2020-10-19 DIAGNOSIS — Z8616 Personal history of COVID-19: Secondary | ICD-10-CM | POA: Insufficient documentation

## 2020-10-19 DIAGNOSIS — E1169 Type 2 diabetes mellitus with other specified complication: Secondary | ICD-10-CM | POA: Diagnosis not present

## 2020-10-19 DIAGNOSIS — Z794 Long term (current) use of insulin: Secondary | ICD-10-CM | POA: Insufficient documentation

## 2020-10-19 LAB — BASIC METABOLIC PANEL
Anion gap: 8 (ref 5–15)
BUN: 13 mg/dL (ref 8–23)
CO2: 27 mmol/L (ref 22–32)
Calcium: 9.7 mg/dL (ref 8.9–10.3)
Chloride: 102 mmol/L (ref 98–111)
Creatinine, Ser: 0.98 mg/dL (ref 0.44–1.00)
GFR, Estimated: >60 mL/min (ref >60)
Glucose, Bld: 155 mg/dL — ABNORMAL HIGH (ref 70–99)
Potassium: 3.7 mmol/L (ref 3.5–5.1)
Sodium: 137 mmol/L (ref 135–145)

## 2020-10-19 LAB — TROPONIN I (HIGH SENSITIVITY)
Troponin I (High Sensitivity): 10 ng/L (ref <18)
Troponin I (High Sensitivity): 14 ng/L (ref <18)

## 2020-10-19 LAB — CBC
HCT: 47.2 % — ABNORMAL HIGH (ref 36.0–46.0)
Hemoglobin: 15.2 g/dL — ABNORMAL HIGH (ref 12.0–15.0)
MCH: 29.1 pg (ref 26.0–34.0)
MCHC: 32.2 g/dL (ref 30.0–36.0)
MCV: 90.4 fL (ref 80.0–100.0)
Platelets: 325 10*3/uL (ref 150–400)
RBC: 5.22 MIL/uL — ABNORMAL HIGH (ref 3.87–5.11)
RDW: 13.7 % (ref 11.5–15.5)
WBC: 8.8 10*3/uL (ref 4.0–10.5)
nRBC: 0 % (ref 0.0–0.2)

## 2020-10-19 LAB — RESP PANEL BY RT-PCR (FLU A&B, COVID) ARPGX2
Influenza A by PCR: NEGATIVE
Influenza B by PCR: NEGATIVE
SARS Coronavirus 2 by RT PCR: POSITIVE — AB

## 2020-10-19 MED ORDER — LORAZEPAM 2 MG/ML IJ SOLN
1.0000 mg | Freq: Once | INTRAMUSCULAR | Status: AC
  Administered 2020-10-19: 1 mg via INTRAVENOUS
  Filled 2020-10-19: qty 1

## 2020-10-19 MED ORDER — NIRMATRELVIR/RITONAVIR (PAXLOVID)TABLET: 3.0000 | ORAL_TABLET | Freq: Two times a day (BID) | ORAL | 0 refills | Status: AC

## 2020-10-19 MED ORDER — PROCHLORPERAZINE EDISYLATE 10 MG/2ML IJ SOLN
5.0000 mg | Freq: Once | INTRAMUSCULAR | Status: AC
  Administered 2020-10-19: 5 mg via INTRAVENOUS
  Filled 2020-10-19: qty 2

## 2020-10-19 MED ORDER — SODIUM CHLORIDE 0.9 % IV BOLUS
500.0000 mL | Freq: Once | INTRAVENOUS | Status: AC
  Administered 2020-10-19: 500 mL via INTRAVENOUS

## 2020-10-19 MED ORDER — KETOROLAC TROMETHAMINE 30 MG/ML IJ SOLN
15.0000 mg | Freq: Once | INTRAMUSCULAR | Status: AC
  Administered 2020-10-19: 15 mg via INTRAVENOUS
  Filled 2020-10-19: qty 1

## 2020-10-19 NOTE — ED Provider Notes (Signed)
Emergency Medicine Provider Triage Evaluation Note  Destiny Beasley , a 65 y.o. female  was evaluated in triage.  Pt complains of chest pain, cough , headache, diarrhea. Neg come covid test.  Review of Systems  Positive: chest pain, cough , headache, diarrhea. Negative: vomiting  Physical Exam  BP (!) 148/108   Pulse (!) 114   Temp 100.1 F (37.8 C)   Resp (!) 22   SpO2 95%  Gen:   Awake, no distress   Resp:  Normal effort  MSK:   Moves extremities without difficulty  Other:    Medical Decision Making  Medically screening exam initiated at 2:42 PM.  Appropriate orders placed.  Destiny Beasley was informed that the remainder of the evaluation will be completed by another provider, this initial triage assessment does not replace that evaluation, and the importance of remaining in the ED until their evaluation is complete.     Destiny Beasley 10/19/20 1443    Destiny Dessert, MD 10/21/20 1235

## 2020-10-19 NOTE — ED Notes (Signed)
Pt freaking out saying im having a panic attack. What kind of medicine did you give me ? I reassured the pt and told her I did not give her any medicine that I would ask her nurse about the situation. RN made aware and RN letting MD know.

## 2020-10-19 NOTE — Discharge Instructions (Addendum)
As discussed, you have been diagnosed with COVID.  Please monitor your condition carefully and do not hesitate to return here for concerning changes.

## 2020-10-19 NOTE — Telephone Encounter (Signed)
Ok to change metoprolol succinate to 25 mg twice daily Please send new Rx for Entresto to her listed pharmacy. Richardson Dopp, PA-C    10/19/2020 4:49 PM

## 2020-10-19 NOTE — ED Triage Notes (Signed)
Pt reports productive cough with clear sputum, chest pain, SOB, nausea, and vomiting since this morning. States she went to [facility] and sent to ED due to cardiac history.

## 2020-10-19 NOTE — ED Provider Notes (Signed)
[facility] EMERGENCY DEPARTMENT Provider Note   CSN: [id number] Arrival date & time: 10/19/20  1406     History Chief Complaint  Patient presents with   Cough   Chest Pain    Destiny Beasley is a 65 y.o. female.  HPI Patient presents with female companion who assists with the history. She presents due to cough, congestion, chest tightness and headache.  She notes multiple medical issues, but states that she was generally well when she went to bed last night.  Now, today, for about the past 11 hours she has had headache, chest tightness that improves with exertion, congestion, cough.  No fever.  There has been nausea, however.  No abdominal pain. She had COVID earlier in the year, and has received her vaccines. She is unaware of sick contacts, though she does work in a senior living facility.     Past Medical History:  Diagnosis Date   Anemia    Anxiety    CAD in native artery    a. Inf STEMI 05/2023 complicated by cardiogenic shock/complete heart block s/p emergent cath showing culprit large dominant LCx s/p aspiration thrombectomy and DES, EF 50% by cath, 40-45% by echo.   Chronic systolic heart failure ([place]) 06/25/2017   Echo 11/19: mild LVH, EF 40-45, inf-lat HK, Gr 1 DD, trivial MR   Complete heart block, transient ([place])    a. 06/2017 in setting of acute inf MI -> resolved after PCI.   COVID-19    Diabetes mellitus    Goiter    History of radiation therapy 06/14/2016 - 07/19/2016   Right Breast 50 Gy 25 fractions   Hypercholesteremia    Hypertension    Ischemic cardiomyopathy    a. EF 50% by cath and 40-45% by echo 06/2017.   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   Type II diabetes mellitus, uncontrolled ([place]) 02/11/2014   Essential hypertension 09/07/2013   Atypical chest pain 11/11/2011   Anxiety 11/11/2011   Insulin dependent diabetes mellitus 11/11/2011   Hyperlipidemia LDL goal <70 11/11/2011    Past Surgical History:  Procedure Laterality Date   ABDOMINAL HYSTERECTOMY     BRAIN MENINGIOMA EXCISION  2005   BREAST BIOPSY     BREAST LUMPECTOMY Right    2017   BREAST LUMPECTOMY WITH RADIOACTIVE SEED LOCALIZATION Right 04/20/2016   Procedure: RIGHT BREAST LUMPECTOMY WITH RADIOACTIVE SEED LOCALIZATION;  Surgeon: Fanny Skates, MD;  Location: [place];  Service: General;  Laterality: Right;   BREAST REDUCTION SURGERY Bilateral 10/23/2016   Procedure: BILATERAL BREAST REDUCTION WITH LIPOSUCTION ASSISTANCE;  Surgeon: Cristine Polio, MD;  Location: [place];  Service: Plastics;  Laterality: Bilateral;   CESAREAN SECTION     CORONARY STENT INTERVENTION N/A 06/25/2017   Procedure: CORONARY STENT INTERVENTION;  Surgeon: Sherren Mocha, MD;  Location:  [place] [facility];  Service: Cardiovascular;  Laterality: N/A;   CORONARY/GRAFT ACUTE MI REVASCULARIZATION N/A 06/25/2017   Procedure: Coronary/Graft Acute MI Revascularization;  Surgeon: Sherren Mocha, MD;  Location: [place] [facility];  Service: Cardiovascular;  Laterality: N/A;   FOOT SURGERY Bilateral 2016   hammer toe surgery   LEFT HEART CATH AND CORONARY ANGIOGRAPHY N/A 06/25/2017   Procedure: LEFT HEART CATH AND CORONARY ANGIOGRAPHY;  Surgeon: Sherren Mocha, MD;  Location: [place] [facility];  Service: Cardiovascular;  Laterality: N/A;   LEFT HEART CATH AND CORONARY ANGIOGRAPHY N/A 05/13/2020   Procedure: LEFT HEART CATH AND CORONARY ANGIOGRAPHY;  Surgeon: Lorretta Harp, MD;  Location: [place] [facility];  Service: Cardiovascular;  Laterality: N/A;   MENISCUS REPAIR Left 2015   PITUITARY SURGERY     REDUCTION MAMMAPLASTY     RIGHT HEART CATH N/A 06/25/2017   Procedure: RIGHT HEART CATH;  Surgeon: Sherren Mocha, MD;  Location: [place] [facility];  Service: Cardiovascular;  Laterality: N/A;   THYROID SURGERY       OB History     Gravida  4   Para      Term      Preterm      AB      Living  2      SAB      IAB      Ectopic      Multiple      Live Births              Family History  Problem Relation Age of Onset   Diabetes Mother    Hyperlipidemia Mother    Hypertension Mother     Social History   Tobacco Use   Smoking status: Never   Smokeless tobacco: Never  Vaping Use   Vaping Use: Never used  Substance Use Topics   Alcohol use: Yes    Alcohol/week: 1.0 standard drink    Types: 1 Glasses of wine per week    Comment: 1 glass monthly   Drug use: No    Home Medications Prior to Admission medications   Medication Sig Start Date End Date Taking? Authorizing Provider  Alirocumab (PRALUENT) 75 MG/ML SOAJ Inject 1 mL into the skin every 14 (fourteen) days. 09/28/20   Sherren Mocha, MD  ALPRAZOLAM PO Take 12.5 mg by mouth 2 (two) times daily  as needed (anxiety attack).    [provider]  aspirin EC 81 MG tablet Take 1 tablet (81 mg total) by mouth daily. 01/01/19   Richardson Dopp T, PA-C  carvedilol (COREG) 25 MG tablet Take 12.5 mg by mouth at bedtime.    [provider]  Coenzyme Q10 100 MG capsule Take 100 mg by mouth daily.    [provider]  Continuous Blood Gluc Receiver (FREESTYLE LIBRE 2  READER) DEVI Use reader to monitor blood sugars continuously. 03/23/20   Elayne Snare, MD  Continuous Blood Gluc Sensor (FREESTYLE LIBRE 2 SENSOR) MISC USE 1 SENSOR ONCE EVERY 14  DAYS TO MONITOR BLOOD  SUGARS CONTINUOUSLY 10/07/20   Elayne Snare, MD  furosemide (LASIX) 20 MG tablet Take 1 tablet (20 mg total) by mouth daily. For 3 days then take as need or fluid and swelling Patient taking differently: Take 20 mg by mouth 3 (three) times daily as needed for fluid or edema. 09/08/20 12/07/20  Richardson Dopp T, PA-C  glucose blood (FREESTYLE TEST STRIPS) test strip Use as instructed 06/10/20   Elayne Snare, MD  HUMALOG 100 UNIT/ML cartridge INECT 3-25 UNITS INTO THE SKIN THREE TIMES DAILY WITH MEALS. USE PER SLIDING SCALE Patient taking differently: Inject 3-25 Units into the skin 3 (three) times daily with meals. Per sliding scale 09/06/20   Elayne Snare, MD  INPEN 100-BLUE-LILLY DEVI CONSULT MD FOR APP SETTINGS. 06/10/20   Elayne Snare, MD  insulin glargine (LANTUS SOLOSTAR) 100 UNIT/ML Solostar Pen Inject 70 Units into the skin daily. Patient taking differently: Inject 60 Units into the skin daily. 09/01/20   Elayne Snare, MD  irbesartan (AVAPRO) 300 MG tablet Take 300 mg by mouth daily.    [provider]  JARDIANCE 10 MG TABS tablet Take 10 mg by mouth daily. 10/07/20   [provider]  liraglutide (VICTOZA) 18 MG/3ML SOPN Inject 0.6 mg for 7 days, then increase to 1.2 mg daily Patient taking differently: Inject 0.6-1.2 mg into the skin daily. 09/06/20   Elayne Snare, MD  metoprolol succinate (TOPROL-XL) 50 MG 24  hr tablet Take 1 tablet (50 mg total) by mouth daily. Take with or immediately following a meal. 10/08/20 01/06/21  Weaver, Nicki Reaper T, PA-C  nitroGLYCERIN (NITROSTAT) 0.4 MG SL tablet DISSOLVE ONE TABLET UNDER TONGUE AS NEEDED FOR CHEST PAIN Patient taking differently: Place 0.4 mg under the tongue every 5 (five) minutes as needed for chest pain. 05/18/20   Sherren Mocha, MD  Omega-3 Fatty Acids (FISH OIL OMEGA-3 PO) Take 1 tablet by mouth daily.    [provider]  sertraline (ZOLOFT) 50 MG tablet Take 0.5 tablets (25 mg total) by mouth daily. 08/25/20   Sherren Mocha, MD  valACYclovir (VALTREX) 1000 MG tablet Take 1,000 mg by mouth 2 (two) times daily as needed (infection). 05/02/20   [provider]  venlafaxine XR (EFFEXOR-XR) 37.5 MG 24 hr capsule Take 37.5 mg by mouth every other day.    [provider]    Allergies    Hydrocodone, Invokana [canagliflozin], Amlodipine, Carvedilol, Chlorthalidone, Evolocumab, Hydralazine, Imdur [isosorbide nitrate], Jardiance [empagliflozin], Rosuvastatin, Simvastatin, Spironolactone, and Atorvastatin  Review of Systems   Review of Systems  Constitutional:        Per HPI, otherwise negative  HENT:         Per HPI, otherwise negative  Respiratory:         Per HPI, otherwise negative  Cardiovascular:        Per HPI, otherwise negative  Gastrointestinal:  Negative for vomiting.  Endocrine:       Negative aside from HPI  Genitourinary:        Neg aside from HPI   Musculoskeletal:        Per HPI, otherwise negative  Skin: Negative.   Neurological:  Negative for syncope.   Physical Exam Updated Vital Signs BP (!) 151/75   Pulse (!) 116   Temp 100.1 F (37.8  C)   Resp 20   SpO2 100%   Physical Exam Vitals and nursing note reviewed.  Constitutional:      General: She is not in acute distress.    Appearance: She is well-developed.  HENT:     Head: Normocephalic and atraumatic.  Eyes:     Conjunctiva/sclera:  Conjunctivae normal.  Cardiovascular:     Rate and Rhythm: Normal rate and regular rhythm.  Pulmonary:     Effort: Pulmonary effort is normal. No respiratory distress.     Breath sounds: Normal breath sounds. No stridor.  Abdominal:     General: There is no distension.  Skin:    General: Skin is warm and dry.  Neurological:     General: No focal deficit present.     Mental Status: She is alert and oriented to person, place, and time.     Cranial Nerves: No cranial nerve deficit.    ED Results / Procedures / Treatments   Labs (all labs ordered are listed, but only abnormal results are displayed) Labs Reviewed  BASIC METABOLIC PANEL - Abnormal; Notable for the following components:      Result Value   Glucose, Bld 155 (*)    All other components within normal limits  CBC - Abnormal; Notable for the following components:   RBC 5.22 (*)    Hemoglobin 15.2 (*)    HCT 47.2 (*)    All other components within normal limits  RESP PANEL BY RT-PCR (FLU A&B, COVID) ARPGX2  TROPONIN I (HIGH SENSITIVITY)  TROPONIN I (HIGH SENSITIVITY)    EKG EKG Interpretation  Date/Time:  Tuesday October 19 2020 14:25:04 EDT Ventricular Rate:  128 PR Interval:  176 QRS Duration: 74 QT Interval:  278 QTC Calculation: 405 R Axis:   45 Text Interpretation: Sinus tachycardia with occasional Premature ventricular complexes T wave abnormality Artifact Abnormal ECG Confirmed by Carmin Muskrat [phone]) on 10/19/2020 4:27:30 PM  Radiology DG Chest 2 View  Result Date: 10/19/2020 CLINICAL DATA:  Chest pain and cough EXAM: CHEST - 2 VIEW COMPARISON:  10/08/2020 FINDINGS: Cardiac shadow is within normal limits. Lungs are clear. No focal infiltrate or effusion is noted. No bony abnormality is seen. IMPRESSION: No active cardiopulmonary disease. Electronically Signed   By: Inez Catalina M.D.   On: 10/19/2020 15:17    Procedures Procedures   Medications Ordered in ED Medications - No data to display  ED  Course  I have reviewed the triage vital signs and the nursing notes.  Pertinent labs & imaging results that were available during my care of the patient were reviewed by me and considered in my medical decision making (see chart for details).  Cardiac 120 sinus tach abnormal Pulse ox 98% room air normal 8:21 PM COVID test positive.  On repeat exam the patient states that she feels much better she has received fluids, Toradol, and after developing some anxiousness after Compazine and Ativan. Labs reassuring, x-ray reviewed, no evidence for concurrent pneumonia.  Though she had COVID in March of last year, and has received her vaccines, today's positive result was discussed at length, in terms of implications, outpatient therapeutic options. Patient amenable to discharge following fluids with PACs COVID, outpatient follow-up  Destiny Beasley was evaluated in Emergency Department on 10/19/2020 for the symptoms described in the history of present illness. She was evaluated in the context of the global COVID-19 pandemic, which necessitated consideration that the patient might be at risk for infection with the SARS-CoV-2 virus  that causes COVID-19. Institutional protocols and algorithms that pertain to the evaluation of patients at risk for COVID-19 are in a state of rapid change based on information released by regulatory bodies including the CDC and federal and state organizations. These policies and algorithms were followed during the patient's care in the ED.  Final Clinical Impression(s) / ED Diagnoses Final diagnoses:  COVID    Rx / DC Orders ED Discharge Orders          Ordered    nirmatrelvir/ritonavir EUA (PAXLOVID) TABS  2 times daily        10/19/20 2023             Carmin Muskrat, MD 10/19/20 2024

## 2020-10-20 MED ORDER — METOPROLOL SUCCINATE ER 25 MG PO TB24: 25.0000 mg | ORAL_TABLET | Freq: Two times a day (BID) | ORAL | 3 refills | Status: DC

## 2020-10-20 MED ORDER — ENTRESTO 49-51 MG PO TABS: 1.0000 | ORAL_TABLET | Freq: Two times a day (BID) | ORAL | 3 refills | Status: DC

## 2020-10-21 ENCOUNTER — Telehealth: Payer: Self-pay

## 2020-10-21 NOTE — Telephone Encounter (Signed)
**Note De-Identified  Obfuscation** I started a [employer] PA through covermymeds. Key: Alfred Levins

## 2020-10-21 NOTE — Telephone Encounter (Signed)
Rosamond is returning Lynn's call. Please advise.

## 2020-10-22 NOTE — Telephone Encounter (Addendum)
**Note De-Identified  Obfuscation** I did not call the pt and only did a Entresto PA for her.  I called the pt and she states that she was returning a call to [place] (not [place]). I advised her that I am unsure who Hoyle Sauer is and am unaware of anyone from this office attempting to call her.  She states that she currently has Covid and is "stuck at home" and that if anyone from [facility] needs her to please call her back and she is calling her PCPs office as they may have called her.  She thanked me for calling her back.

## 2020-10-28 ENCOUNTER — Other Ambulatory Visit: Payer: 59

## 2020-10-31 NOTE — Progress Notes (Signed)
Office Visit    Patient Name: Destiny Beasley Date of Encounter: 11/01/2020  PCP:  Lawerance Cruel, [facility]  Cardiologist:  Sherren Mocha, MD  Advanced Practice Provider:  Liliane Shi, PA-C Electrophysiologist:  None    Chief Complaint    ANTINETTE Beasley is a 65 y.o. female with a hx of CAD s/p inferior-lateral STEMI 06/2017 with DES to LCx, HFrEF, ICM, DM2, TIA, OSA, breast cancer, HTN, HLD with statin intolerance, COVID19, pituitary tumor s/p resection, meningioma, anxiety presents today for ED visit follow up and preoperative clearance.   Past Medical History    Past Medical History:  Diagnosis Date   Anemia    Anxiety    CAD in native artery    a. Inf STEMI 0/3009 complicated by cardiogenic shock/complete heart block s/p emergent cath showing culprit large dominant LCx s/p aspiration thrombectomy and DES, EF 50% by cath, 40-45% by echo.   Chronic systolic heart failure ([place]) 06/25/2017   Echo 11/19: mild LVH, EF 40-45, inf-lat HK, Gr 1 DD, trivial MR   Complete heart block, transient ([place])    a. 06/2017 in setting of acute inf MI -> resolved after PCI.   COVID-19    Diabetes mellitus    Goiter    History of radiation therapy 06/14/2016 - 07/19/2016   Right Breast 50 Gy 25 fractions   Hypercholesteremia    Hypertension    Ischemic cardiomyopathy    a. EF 50% by cath and 40-45% by echo 06/2017.   Leg pain    ABIs 07/2019: Normal (R 1.27; L 1.25)   Malignant neoplasm of upper-outer quadrant of right female breast ([place]) 02/24/2016   Meningioma [facility])    Nuclear stress test    Nuclear stress test 10/19: EF 35, inferolateral, apical scar; inferior, apical inferior infarct with mild peri-infarct ischemia; high risk   Obesity    Personal history of chemotherapy    Personal history of radiation therapy    Pituitary tumor    Seasonal asthma    Sleep apnea    does not use every night   Stroke [facility])    TIA - Dec 1 st, 2017   Past  Surgical History:  Procedure Laterality Date   ABDOMINAL HYSTERECTOMY     BRAIN MENINGIOMA EXCISION  2005   BREAST BIOPSY     BREAST LUMPECTOMY Right    2017   BREAST LUMPECTOMY WITH RADIOACTIVE SEED LOCALIZATION Right 04/20/2016   Procedure: RIGHT BREAST LUMPECTOMY WITH RADIOACTIVE SEED LOCALIZATION;  Surgeon: Fanny Skates, MD;  Location: [place];  Service: General;  Laterality: Right;   BREAST REDUCTION SURGERY Bilateral 10/23/2016   Procedure: BILATERAL BREAST REDUCTION WITH LIPOSUCTION ASSISTANCE;  Surgeon: Cristine Polio, MD;  Location: [place];  Service: Plastics;  Laterality: Bilateral;   CESAREAN SECTION     CORONARY STENT INTERVENTION N/A 06/25/2017   Procedure: CORONARY STENT INTERVENTION;  Surgeon: Sherren Mocha, MD;  Location: [place] CV LAB;  Service: Cardiovascular;  Laterality: N/A;   CORONARY/GRAFT ACUTE MI REVASCULARIZATION N/A 06/25/2017   Procedure: Coronary/Graft Acute MI Revascularization;  Surgeon: Sherren Mocha, MD;  Location: [place] [facility] LAB;  Service: Cardiovascular;  Laterality: N/A;   FOOT SURGERY Bilateral 2016   hammer toe surgery   LEFT HEART CATH AND CORONARY ANGIOGRAPHY N/A 06/25/2017   Procedure: LEFT HEART CATH AND CORONARY ANGIOGRAPHY;  Surgeon: Sherren Mocha, MD;  Location: [place] CV [facility];  Service: Cardiovascular;  Laterality: N/A;   LEFT HEART CATH AND CORONARY ANGIOGRAPHY N/A 05/13/2020   Procedure: LEFT HEART CATH AND CORONARY ANGIOGRAPHY;  Surgeon: Lorretta Harp, MD;  Location: [place] CV LAB;  Service: Cardiovascular;  Laterality: N/A;   MENISCUS REPAIR Left 2015   PITUITARY SURGERY     REDUCTION MAMMAPLASTY     RIGHT HEART CATH N/A 06/25/2017   Procedure: RIGHT HEART CATH;  Surgeon: Sherren Mocha, MD;  Location: [place] CV LAB;  Service: Cardiovascular;  Laterality: N/A;   THYROID SURGERY      Allergies  Allergies  Allergen Reactions   Hydrocodone Other (See Comments)    Severe  anxiety Confusion    Invokana [Canagliflozin] Other (See Comments)    Constant yeast infections   Amlodipine Other (See Comments)    Muscles hurt   Carvedilol Other (See Comments)    Wheezing with 25mg  BID dosing NOT ALLERGIC 10/08/20   Chlorthalidone Other (See Comments)    Leg pain   Evolocumab Other (See Comments)    Injection site pain   Hydralazine Other (See Comments)    Fatigue   Imdur [Isosorbide Nitrate] Other (See Comments)    Patient reported wheezing   Jardiance [Empagliflozin] Other (See Comments)    lightheadedness, weakness, low BP and yeast infection.  NOT ALLERGIC 10/08/20   Rosuvastatin Other (See Comments)    myalgias   Simvastatin Other (See Comments)    myalgias   Spironolactone Other (See Comments)    Wheezing vs fatigue?   Atorvastatin Other (See Comments)    DIZZINESS   Compazine [Prochlorperazine] Anxiety    History of Present Illness    Destiny Beasley is a 65 y.o. female with a hx of CAD s/p inferior-lateral STEMI 06/2017 with DES to LCx, HFrEF, ICM, DM2, hyperthyroidism, TIA, OSA, breast cancer, HTN, HLD with statin intolerance, COVID19, pituitary tumor s/p resection, meningioma, anxiety last seen 10/08/20 by Richardson Dopp, PA.  CAD dates back to inf-lat STEMI 06/2017 with DES to pLCx. Cardiac catheterization 04/2020 with patent LCx stent and pLAD with 40$ stenosis. Her LVEF since her STEMI has been 40-45%.   She was seen via telemedicine 08/2020 and started on Lasix due to volume excess. She was seen in follow up 10/08/20 with chief complaint of hot flashes which she attributed to Carvedilol. As she was being discharged from the office she developed chest pain and shortness of breath. EKG was without acute changes. BP as high as 200/100. She was transported via EMS to ED. Symptoms resolved with nitroglycerin and Xanax. Hs-troponin negative x2, d-dimer minimally elevated 0.62 though negative when adjusted for age. Head CT unremarkable.   She presented to the  ED 10/19/20 due to cough, congestion, chest tightness, headache. She tested positive for COVID. She felt improved after receiving fluids, Toradol, Compazine, Ativan. She was discharged on Paxlovid.   She presents today for follow up. Preoperative clearance for unilateral mammary implant at site of previous biopsy. Reports no shortness of breath. Tells me her dyspnea on exertion is stable at her baseline. She is exercising using her treadmill and routinely walks 20-30 minutes 5 days per week. Reports no chest pain, pressure, or tightness. No edema, orthopnea, PND. Reports no palpitations.   Tells me when she takes Metoprolol she feels "jittery" when she takes the second dose. She has only been taking in the morning with all of her medications around 7:30am.   Tells me the Jardiance caused a yeast infection. It happened last time as well so she  is not interested in trying again.   She monitors her weight at home. She has not had edema, orthopnea, PND. Has not required PRN Lasix recently. Tells me she checks her blood pressure at home sometimes. She checks her blood pressure with wrist cuff routinely SBP 140s. Has not had cuff checked for accuracy. Has a CPAP at home but has not been using. We discussed the cardiovascular implications of not using CPAP regularly.   EKGs/Labs/Other Studies Reviewed:   The following studies were reviewed today: Echocardiogram 05/13/20 EF 40-45, inf and inf-lat HK, normal RVSF, trivial MR, trivial AI   Cardiac catheterization 05/13/20 LAD prox 40 LCx stent patent EF 45-50    ABIs 07/2019 Normal    Echocardiogram 09/05/2018 EF 40-45, inf-lat AK, mild LVH, Gr 1 DD, normal RVSF   Echo 03/07/18 Mild LVH, EF 40-45, GLS -11%, inf-lat HK, Gr 1 DD, trivial MR   Nuclear stress test 02/12/18 EF 35, inferolateral, apical scar; inferior, apical inferior infarct with mild peri-infarct ischemia; high risk   Echo 06/26/17 EF 40-45, inferolateral hypokinesis, mild MR    Cardiac catheterization 06/25/17 LM luminal irregularities LAD luminal irregularities, proximal 30 LCx proximal 100 RCA irregularities EF 45-50, inferolateral akinesis PCI: 4 x 20 mm Synergy DES to the proximal LCx   Echo 03/25/16 Moderate LVH, EF 60-65, normal wall motion   Carotid US 12/17 Bilateral ICA 1-39    EKG:  EKG is not ordered today.  The ekg independently reviewed from 10/08/20 demonstrated NSR 86 bpm with no acute ST/T wave changes. EKG 10/19/20 in ED in setting of COVID19 ST 138 bpm with occasional PVC and no acute ST/T wave changes.   Recent Labs: 09/02/2020: NT-Pro BNP 318 10/06/2020: ALT 24 10/08/2020: B Natriuretic Peptide 45.4; TSH 1.295 10/19/2020: BUN 13; Creatinine, Ser 0.98; Hemoglobin 15.2; Platelets 325; Potassium 3.7; Sodium 137  Recent Lipid Panel    Component Value Date/Time   CHOL 120 10/06/2020 0743   TRIG 63 10/06/2020 0743   HDL 54 10/06/2020 0743   CHOLHDL 2.2 10/06/2020 0743   CHOLHDL 2 08/12/2018 0944   VLDL 19.2 08/12/2018 0944   LDLCALC 52 10/06/2020 0743   LDLDIRECT 67 07/15/2020 1609   LDLDIRECT 87.0 06/04/2019 0835   Home Medications   Current Meds  Medication Sig   Alirocumab (PRALUENT) 75 MG/ML SOAJ Inject 1 mL into the skin every 14 (fourteen) days.   ALPRAZOLAM PO Take 12.5 mg by mouth 2 (two) times daily as needed (anxiety attack).   aspirin EC 81 MG tablet Take 1 tablet (81 mg total) by mouth daily.   Coenzyme Q10 100 MG capsule Take 100 mg by mouth daily.   Continuous Blood Gluc Receiver (FREESTYLE LIBRE 2 READER) DEVI Use reader to monitor blood sugars continuously.   Continuous Blood Gluc Sensor (FREESTYLE LIBRE 2 SENSOR) MISC USE 1 SENSOR ONCE EVERY 14  DAYS TO MONITOR BLOOD  SUGARS CONTINUOUSLY   furosemide (LASIX) 20 MG tablet Take 1 tablet (20 mg total) by mouth daily. For 3 days then take as need or fluid and swelling   glucose blood (FREESTYLE TEST STRIPS) test strip Use as instructed   HUMALOG 100 UNIT/ML cartridge INECT  3-25 UNITS INTO THE SKIN THREE TIMES DAILY WITH MEALS. USE PER SLIDING SCALE   hydrALAZINE (APRESOLINE) 10 MG tablet Take 1 tablet (10 mg total) by mouth in the morning and at bedtime.   INPEN 100-BLUE-LILLY DEVI CONSULT MD FOR APP SETTINGS.   insulin glargine (LANTUS SOLOSTAR) 100 UNIT/ML Solostar Pen  Inject 70 Units into the skin daily.   liraglutide (VICTOZA) 18 MG/3ML SOPN Inject 0.6 mg for 7 days, then increase to 1.2 mg daily   nitroGLYCERIN (NITROSTAT) 0.4 MG SL tablet DISSOLVE ONE TABLET UNDER TONGUE AS NEEDED FOR CHEST PAIN   Omega-3 Fatty Acids (FISH OIL OMEGA-3 PO) Take 1 tablet by mouth daily.   sertraline (ZOLOFT) 50 MG tablet Take 100 mg by mouth daily.   valACYclovir (VALTREX) 1000 MG tablet Take 1,000 mg by mouth 2 (two) times daily as needed (infection).   [DISCONTINUED] hydrALAZINE (APRESOLINE) 10 MG tablet Take 1 tablet (10 mg total) by mouth 3 (three) times daily.   [DISCONTINUED] irbesartan (AVAPRO) 300 MG tablet Take 300 mg by mouth daily.   [DISCONTINUED] metoprolol succinate (TOPROL-XL) 25 MG 24 hr tablet Take 1 tablet (25 mg total) by mouth 2 (two) times daily. Take with or immediately following a meal.     Review of Systems      All other systems reviewed and are otherwise negative except as noted above.  Physical Exam    VS:  BP (!) 160/80   Pulse (!) 106   Ht 6' (1.829 m)   Wt 265 lb (120.2 kg)   SpO2 98%   BMI 35.94 kg/m  , BMI Body mass index is 35.94 kg/m.  Wt Readings from Last 3 Encounters:  11/01/20 265 lb (120.2 kg)  10/08/20 270 lb 15.1 oz (122.9 kg)  10/08/20 271 lb (122.9 kg)    GEN: Well nourished, overweight, well developed, in no acute distress. HEENT: normal. Neck: Supple, no JVD, carotid bruits, or masses. Cardiac: RRR, no murmurs, rubs, or gallops. No clubbing, cyanosis, edema. Radials/PT 2+ and equal bilaterally.  Respiratory:  Respirations regular and unlabored, clear to auscultation bilaterally. GI: Soft, nontender,  nondistended. MS: No deformity or atrophy. Skin: Warm and dry, no rash. Neuro:  Strength and sensation are intact. Psych: Normal affect.  Assessment & Plan    CAD - Stable with no anginal symptoms. No indication for ischemic evaluation.  EKG independently reviewed from 10/08/20 and 10/19/20 with no acute ST/T wave changes. GDMT includes Praluent, Aspirin, Metoprolol. Heart healthy diet and regular cardiovascular exercise encouraged.    Preoperative clearance - Per primary cardiologist pt low cardiac risk, may hold Aspirin 5-7 days prior to surgery or at discretion of surgeon. Low risk procedure. She has an exercise tolerance of >4 METS and no anginal symptoms. She is deemed acceptable risk for the planned procedure and may proceed without additional cardiovascular testing. Will route to surgeon's office via [place] fax function so they are aware.   Tachycardia - Mild tachycardia 106 bpm today. Likely due to recuperating from [facility]. Asymptomatic with no palpitations. Presently on Toprol 25mg  QD and reports feeling "jittery" with increased doses. Continue to monitor.   HFrEF / ICM - 05/13/20 LVEF 40-45%. Euvolemic and well compensated on exam. GDMT includes Toprol (previous intolerance to Carvedilol), irbesartan 300mg  QD, Lasix PRN. 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 When approved, transition Irbesartan to Entresto. Anticipate she will require 97-103mg  BID dosing.Add Hydralazine today for optimal BP control and optimization of HF therapy. Start Hydralazine 10mg  BID. Previous intolerance to Imdur, Jardiance. Heart healthy diet and regular cardiovascular exercise encouraged.  Educated to report weight gain of 2 lb overnight or 5 lb in 1 week.   HLD, LDL goal <70 - 10/06/20 LDL 52. Continue Praluent. Intolerance to Rosuvastatin, Simvastatin, Atorvastatin with myalgias.Heart healthy diet and regular cardiovascular exercise encouraged.  HTN - BP not well controlled. Intolerant of  Carvedilol per patient report. Per her report only tolerates Toprol 25mg  daily (not BID), will update instructions to say take Toprol 25mg  once daily. Continue Irbesartan 300mg  QD. 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 When approved, transition Irbesartan to Entresto. Anticipate she will require 97-103mg  BID dosing. For optimization of BP control, start Hydralazine 10mg  BID. Previous listed intolerance with fatigue but she does not think she "gave it a chance" previously and willing to try again.   Anxiety - Continue to follow with PCP.   OSA - CPAP compliance encouraged.   Disposition: Follow up  as scheduled in September  with Richardson Dopp, PA  Signed, Loel Dubonnet, NP 11/01/2020, 12:50 PM [place]

## 2020-11-01 ENCOUNTER — Encounter (HOSPITAL_BASED_OUTPATIENT_CLINIC_OR_DEPARTMENT_OTHER): Payer: Self-pay | Admitting: Family

## 2020-11-01 ENCOUNTER — Other Ambulatory Visit: Payer: Self-pay

## 2020-11-01 ENCOUNTER — Ambulatory Visit (INDEPENDENT_AMBULATORY_CARE_PROVIDER_SITE_OTHER): Payer: 59 | Admitting: Family

## 2020-11-01 VITALS — BP 160/80 | HR 106 | Ht 72.0 in | Wt 265.0 lb

## 2020-11-01 DIAGNOSIS — I25118 Atherosclerotic heart disease of native coronary artery with other forms of angina pectoris: Secondary | ICD-10-CM

## 2020-11-01 DIAGNOSIS — E785 Hyperlipidemia, unspecified: Secondary | ICD-10-CM

## 2020-11-01 DIAGNOSIS — I5020 Unspecified systolic (congestive) heart failure: Secondary | ICD-10-CM | POA: Diagnosis not present

## 2020-11-01 DIAGNOSIS — Z01818 Encounter for other preprocedural examination: Secondary | ICD-10-CM

## 2020-11-01 DIAGNOSIS — G4733 Obstructive sleep apnea (adult) (pediatric): Secondary | ICD-10-CM

## 2020-11-01 DIAGNOSIS — I10 Essential (primary) hypertension: Secondary | ICD-10-CM

## 2020-11-01 MED ORDER — HYDRALAZINE HCL 10 MG PO TABS: 10.0000 mg | ORAL_TABLET | Freq: Two times a day (BID) | ORAL | 3 refills | Status: DC

## 2020-11-01 MED ORDER — METOPROLOL SUCCINATE ER 25 MG PO TB24: 25.0000 mg | ORAL_TABLET | Freq: Every day | ORAL | 1 refills | Status: DC

## 2020-11-01 MED ORDER — ENTRESTO 49-51 MG PO TABS: 1.0000 | ORAL_TABLET | Freq: Two times a day (BID) | ORAL | 3 refills | Status: DC

## 2020-11-01 MED ORDER — HYDRALAZINE HCL 10 MG PO TABS: 10.0000 mg | ORAL_TABLET | Freq: Three times a day (TID) | ORAL | 2 refills | Status: DC

## 2020-11-01 NOTE — Telephone Encounter (Signed)
**Note De-Identified  Obfuscation** Per covermymeds this Entresto PA has been denied. I called [employer] to request the denial letter be faxed to the office last week but it was not received so I called [employer] back today and did a urgent appeal with Jenny Reichmann for the pts Entresto. Per Jenny Reichmann this [employer] PA has been approved until 11/01/21.  I called the pt to make her aware of this approval and to ask which pharmacy she wants her RX sent to be filled.  Per her request I have escribed her Entresto 49-51 mg #180 with 3 refills to [facility] on [address] in [place] to fill.  She thanked me for me assistance,

## 2020-11-01 NOTE — Patient Instructions (Signed)
Medication Instructions:  Your physician has recommended you make the following change in your medication:   CHANGE Metoprolol to 25mg  one tablet daily  START Hydralazine 10mg  one tablet twice daily  *If you need a refill on your cardiac medications before your next appointment, please call your pharmacy*   Lab Work: None ordered today.   Testing/Procedures: None ordered today.    Follow-Up: At [facility], you and your health needs are our priority.  As part of our continuing mission to provide you with exceptional heart care, we have created designated Provider Care Teams.  These Care Teams include your primary Cardiologist (physician) and Advanced Practice Providers (APPs -  Physician Assistants and Nurse Practitioners) who all work together to provide you with the care you need, when you need it.  We recommend signing up for the patient portal called "MyChart".  Sign up information is provided on this After Visit Summary.  MyChart is used to connect with patients for Virtual Visits (Telemedicine).  Patients are able to view lab/test results, encounter notes, upcoming appointments, etc.  Non-urgent messages can be sent to your provider as well.   To learn more about what you can do with MyChart, go to [email or web address].    Your next appointment:   As scheduled with Richardson Dopp, PA   Other Instructions  Tips to Measure your Blood Pressure Correctly  Here's what you can do to ensure a correct reading:  Don't drink a caffeinated beverage or smoke during the 30 minutes before the test.  Sit quietly for five minutes before the test begins.  During the measurement, sit in a chair with your feet on the floor and your arm supported so your elbow is at about heart level.  The inflatable part of the cuff should completely cover at least 80% of your upper arm, and the cuff should be placed on bare skin, not over a shirt.  Don't talk during the measurement.  Have your blood  pressure measured twice, with a brief break in between. If the readings are different by 5 points or more, have it done a third time.   Blood pressure categories  Blood pressure category SYSTOLIC (upper number)  DIASTOLIC (lower number)  Normal Less than 120 mm Hg and Less than 80 mm Hg  Elevated 120-129 mm Hg and Less than 80 mm Hg  High blood pressure: Stage 1 hypertension 130-139 mm Hg or 80-89 mm Hg  High blood pressure: Stage 2 hypertension 140 mm Hg or higher or 90 mm Hg or higher  Hypertensive crisis (consult your doctor immediately) Higher than 180 mm Hg and/or Higher than 120 mm Hg  Source: American Heart Association and American Stroke Association. For more on getting your blood pressure under control, buy Controlling Your Blood Pressure, a Special Health Report from [facility].   Blood Pressure Log   Date   Time  Blood Pressure  Position  Example: Nov 1 9 AM 124/78 sitting                                                   Heart Healthy Diet Recommendations: A low-salt diet is recommended. Meats should be grilled, baked, or boiled. Avoid fried foods. Focus on lean protein sources like fish or chicken with vegetables and fruits. The American Heart Association is a Microbiologist!  American  Heart Association Diet and Lifeystyle Recommendations    Exercise recommendations: The American Heart Association recommends 150 minutes of moderate intensity exercise weekly. Try 30 minutes of moderate intensity exercise 4-5 times per week. This could include walking, jogging, or swimming.

## 2020-11-01 NOTE — Addendum Note (Signed)
**Note De-Identified Valinda Fedie Obfuscation** Addended by: Dennie Fetters on: 11/01/2020 12:54 PM   Modules accepted: Orders

## 2020-11-05 ENCOUNTER — Telehealth: Payer: Self-pay | Admitting: Physician Assistant

## 2020-11-05 MED ORDER — HYDRALAZINE HCL 10 MG PO TABS: 20.0000 mg | ORAL_TABLET | Freq: Three times a day (TID) | ORAL | 3 refills | Status: DC

## 2020-11-05 NOTE — Telephone Encounter (Signed)
Pt c/o BP issue: STAT if pt c/o blurred vision, one-sided weakness or slurred speech  1. What are your last 5 BP readings?  Right now it is 119/93, left arm 165/103  , 158/101, 163/107, 174/126,  all of these readings were from her left arm  2. Are you having any other symptoms (ex. Dizziness, headache, blurred vision, passed out)?  Headaches, dizziness, little shortness of breath  3. What is your BP issue? High blood pressure

## 2020-11-05 NOTE — Telephone Encounter (Signed)
Gave patient recommendation from [facility] PA. ED precautions given. Patient verbalized understanding.

## 2020-11-05 NOTE — Telephone Encounter (Signed)
The patients BP started becoming high yesterday see had to leave work early around 12 noon.  BP's from yesterday 180/111, 175/116, 163/102 patient took two nitro SL to try and bring the BP down. Not change in diet, fluid or salt intake.  Meds: Entresto started yesterday and hydralazine. She is having upset stomach, diarrhea, flushing, headache, loss of appetite after start two new medications yesterday.  Today BP: Right arm 119/93, left arm 165/103  , 158/101, 163/107, 174/126.  Current: 159/105 HR 86 4:15pm

## 2020-11-05 NOTE — Telephone Encounter (Signed)
Increase Hydralazine to 20 mg three times a day.  Richardson Dopp, PA-C 11/05/20 16:28

## 2020-11-05 NOTE — Telephone Encounter (Signed)
I saw this pt advice entry after the phone note. BP will tend to increase when medications are stopped.  It looks like she stopped the Hydralazine.  I would make sure she is taking the [facility] as prescribed (twice daily). I think it is better to stick with hydralazine and increase the dose (see phone note from today).  If she is unwilling to take the hydralazine due to side effects, I would recommend starting Clonidine 0.1 mg once daily (in the evening) with f/u HTN clinic visit in the next 1-2 weeks.  I would warn her that if she goes with clonidine, she cannot just stop the medication without discussing with [place].  If a patient stops clonidine abruptly, they can have dangerous spikes in their blood pressure putting them at risk for stroke, aortic dissection, acute coronary syndrome, etc.   Richardson Dopp, PA-C    11/05/2020 5:11 PM

## 2020-11-06 NOTE — Telephone Encounter (Signed)
If patient's BP machine is accurate and she reports continued discrepancy between BPs in both arms, please arrange carotid US.  If she cannot get her BP machine checked this weekend, please bring in for a nurse visit or HTN clinic visit to check her BP and have her bring her machine with her.  Richardson Dopp, PA-C    11/06/2020 7:19 AM

## 2020-11-07 ENCOUNTER — Other Ambulatory Visit: Payer: Self-pay | Admitting: Endocrinology

## 2020-11-08 ENCOUNTER — Ambulatory Visit: Payer: 59 | Admitting: Pharmacist

## 2020-11-08 ENCOUNTER — Other Ambulatory Visit: Payer: Self-pay

## 2020-11-08 ENCOUNTER — Encounter (HOSPITAL_COMMUNITY): Payer: Self-pay

## 2020-11-08 ENCOUNTER — Emergency Department (HOSPITAL_COMMUNITY)
Admission: EM | Admit: 2020-11-08 | Discharge: 2020-11-08 | Disposition: A | Discharge status: Home / Self Care | Payer: 59 | Attending: Emergency Medicine | Admitting: Emergency Medicine

## 2020-11-08 DIAGNOSIS — Z79899 Other long term (current) drug therapy: Secondary | ICD-10-CM | POA: Insufficient documentation

## 2020-11-08 DIAGNOSIS — Z20822 Contact with and (suspected) exposure to covid-19: Secondary | ICD-10-CM | POA: Diagnosis not present

## 2020-11-08 DIAGNOSIS — F329 Major depressive disorder, single episode, unspecified: Secondary | ICD-10-CM | POA: Insufficient documentation

## 2020-11-08 DIAGNOSIS — E119 Type 2 diabetes mellitus without complications: Secondary | ICD-10-CM | POA: Diagnosis not present

## 2020-11-08 DIAGNOSIS — I25110 Atherosclerotic heart disease of native coronary artery with unstable angina pectoris: Secondary | ICD-10-CM | POA: Insufficient documentation

## 2020-11-08 DIAGNOSIS — Z7982 Long term (current) use of aspirin: Secondary | ICD-10-CM | POA: Diagnosis not present

## 2020-11-08 DIAGNOSIS — Z853 Personal history of malignant neoplasm of breast: Secondary | ICD-10-CM | POA: Diagnosis not present

## 2020-11-08 DIAGNOSIS — I110 Hypertensive heart disease with heart failure: Secondary | ICD-10-CM | POA: Insufficient documentation

## 2020-11-08 DIAGNOSIS — Z8616 Personal history of COVID-19: Secondary | ICD-10-CM | POA: Insufficient documentation

## 2020-11-08 DIAGNOSIS — I5022 Chronic systolic (congestive) heart failure: Secondary | ICD-10-CM | POA: Diagnosis not present

## 2020-11-08 DIAGNOSIS — F419 Anxiety disorder, unspecified: Secondary | ICD-10-CM | POA: Insufficient documentation

## 2020-11-08 DIAGNOSIS — F32A Depression, unspecified: Secondary | ICD-10-CM

## 2020-11-08 LAB — CBC WITH DIFFERENTIAL/PLATELET
Abs Immature Granulocytes: 0.01 10*3/uL (ref 0.00–0.07)
Basophils Absolute: 0 10*3/uL (ref 0.0–0.1)
Basophils Relative: 1 %
Eosinophils Absolute: 0.2 10*3/uL (ref 0.0–0.5)
Eosinophils Relative: 3 %
HCT: 47.5 % — ABNORMAL HIGH (ref 36.0–46.0)
Hemoglobin: 15 g/dL (ref 12.0–15.0)
Immature Granulocytes: 0 %
Lymphocytes Relative: 36 %
Lymphs Abs: 2.1 10*3/uL (ref 0.7–4.0)
MCH: 28.9 pg (ref 26.0–34.0)
MCHC: 31.6 g/dL (ref 30.0–36.0)
MCV: 91.5 fL (ref 80.0–100.0)
Monocytes Absolute: 0.4 10*3/uL (ref 0.1–1.0)
Monocytes Relative: 6 %
Neutro Abs: 3.1 10*3/uL (ref 1.7–7.7)
Neutrophils Relative %: 54 %
Platelets: 325 10*3/uL (ref 150–400)
RBC: 5.19 MIL/uL — ABNORMAL HIGH (ref 3.87–5.11)
RDW: 13.8 % (ref 11.5–15.5)
WBC: 5.7 10*3/uL (ref 4.0–10.5)
nRBC: 0 % (ref 0.0–0.2)

## 2020-11-08 LAB — COMPREHENSIVE METABOLIC PANEL
ALT: 24 U/L (ref 0–44)
AST: 24 U/L (ref 15–41)
Albumin: 3.8 g/dL (ref 3.5–5.0)
Alkaline Phosphatase: 88 U/L (ref 38–126)
Anion gap: 7 (ref 5–15)
BUN: 13 mg/dL (ref 8–23)
CO2: 27 mmol/L (ref 22–32)
Calcium: 9.1 mg/dL (ref 8.9–10.3)
Chloride: 105 mmol/L (ref 98–111)
Creatinine, Ser: 0.71 mg/dL (ref 0.44–1.00)
GFR, Estimated: >60 mL/min (ref >60)
Glucose, Bld: 215 mg/dL — ABNORMAL HIGH (ref 70–99)
Potassium: 4 mmol/L (ref 3.5–5.1)
Sodium: 139 mmol/L (ref 135–145)
Total Bilirubin: 0.5 mg/dL (ref 0.3–1.2)
Total Protein: 7.4 g/dL (ref 6.5–8.1)

## 2020-11-08 LAB — ETHANOL: Alcohol, Ethyl (B): <10 mg/dL (ref <10)

## 2020-11-08 LAB — RESP PANEL BY RT-PCR (FLU A&B, COVID) ARPGX2
Influenza A by PCR: NEGATIVE
Influenza B by PCR: NEGATIVE
SARS Coronavirus 2 by RT PCR: NEGATIVE

## 2020-11-08 MED ORDER — HYDROXYZINE HCL 25 MG PO TABS
25.0000 mg | ORAL_TABLET | Freq: Three times a day (TID) | ORAL | Status: DC | PRN
  Administered 2020-11-08: 25 mg via ORAL
  Filled 2020-11-08: qty 1

## 2020-11-08 MED ORDER — HYDROXYZINE HCL 25 MG PO TABS: 25.0000 mg | ORAL_TABLET | Freq: Three times a day (TID) | ORAL | 0 refills | Status: DC | PRN

## 2020-11-08 NOTE — Progress Notes (Deleted)
Patient ID: Destiny Beasley                 DOB: 11/16/1955                      MRN: [id number]     HPI: Destiny Beasley is a 65 y.o. female patient of Dr Burt Knack referred by Richardson Dopp, PA to pharmacy clinic for HF medication management. PMH is significant for CAD s/p STEMI 06/2017 s/p DES to pLCx, HFrEF, ICM, DM2, TIA, HTN, HLD with statin intolerance, OSA not compliant with CPAP, breast cancer, COVID-19 infection 06/2019 and 09/2020, pituitary tumor s/p resection, meningioma, and anxiety. Most recent LVEF 40-45% on 05/13/20. Optimal medication management made difficult by multiple medication intolerances.  Seen by Nicki Reaper in office 10/08/20 and reported chest pain, SOB and nausea after her visit as she was checking out. BP up to 200/100, EKG was normal. Given a nitro and Xanax, symptoms had resolved in ED. Hs-trop negative x2, head T unremarkable, d-dimer negative when adjusted for age. Reported frequent history of these episodes intermittently over past few months.  She was last seen in office 11/01/20 by Urban Gibson for pre-op clearance with elevated BP 160/80 and HR 106. She was started on hydralazine 10mg  TID. Pt called clinic 7/15 with elevated BP readings 150-180/100-126. She had stopped her hydralazine as she reported it caused flushing, headache, upset stomach, and loss of appetite. She was advised to restart and increase her hydralazine to 20mg  TID. Also reported BP readings in right arm lower at 119-132/79-93 compared to left arm 158-168/101-118, then reported machine had been "adjusted" and both arms were reading 140/90. She presents today to PharmD clinic for follow up.  Carotid doppler ultrasound if BP readings off on each arm Stable DOE Feels "jittery" attributes to metop Needs to use cpap - will help bp Covid may have inc bp too  Weight today Bmet today on entresto, inc dose to highest Stop lasix if able Retry spiro if willing, otherwise keep inc hydral and Toprol if willing  Today she  returns to pharmacy clinic for further medication titration. At last visit with MD ***. Symptomatically, she is feeling ***, *** dizziness, lightheadedness, and fatigue. *** chest pain or palpitations. Feels SOB when ***. Able to complete all ADLs. Activity level ***. She *** checks her weight at home (normal range *** - *** lbs). *** LEE, PND, or orthopnea. Appetite has been ***. She *** adheres to a low-salt diet.  Lipids excellent on Praluent. Intolerant to rosuvastatin, atorvastatin, and simvastatin (myalgias).  Current CHF meds:  Entresto 49-51mg  BID Toprol 25mg  daily Furosemide 20mg  daily Hydralazine 20mg  TID  Previously tried:  Invokana and Jardiance - yeast infections + weakness/low BP on Jardiance Chlorthalidone - leg pain Imdur - wheezing Spironolactone - wheezing vs fatigue Carvedilol - wheezing with 25mg  BID dose, reported hot flashes with BID dosing  BP goal: <130/72mmHg  Family History:   Social History:   Diet:   Exercise: Uses treadmill and walks 20-30 minutes 5 days per week.  Home BP readings:   Wt Readings from Last 3 Encounters:  11/01/20 265 lb (120.2 kg)  10/08/20 270 lb 15.1 oz (122.9 kg)  10/08/20 271 lb (122.9 kg)   BP Readings from Last 3 Encounters:  11/01/20 (!) 160/80  10/19/20 134/65  10/08/20 (!) 163/81   Pulse Readings from Last 3 Encounters:  11/01/20 (!) 106  10/19/20 (!) 101  10/08/20 79    Renal function: Estimated Creatinine  Clearance: 86.4 mL/min (by C-G formula based on SCr of 0.98 mg/dL).  Past Medical History:  Diagnosis Date   Anemia    Anxiety    CAD in native artery    a. Inf STEMI 12/3233 complicated by cardiogenic shock/complete heart block s/p emergent cath showing culprit large dominant LCx s/p aspiration thrombectomy and DES, EF 50% by cath, 40-45% by echo.   Chronic systolic heart failure ([place]) 06/25/2017   Echo 11/19: mild LVH, EF 40-45, inf-lat HK, Gr 1 DD, trivial MR   Complete heart block, transient ([place])     a. 06/2017 in setting of acute inf MI -> resolved after PCI.   COVID-19    Diabetes mellitus    Goiter    History of radiation therapy 06/14/2016 - 07/19/2016   Right Breast 50 Gy 25 fractions   Hypercholesteremia    Hypertension    Ischemic cardiomyopathy    a. EF 50% by cath and 40-45% by echo 06/2017.   Leg pain    ABIs 07/2019: Normal (R 1.27; L 1.25)   Malignant neoplasm of upper-outer quadrant of right female breast ([place]) 02/24/2016   Meningioma [facility])    Nuclear stress test    Nuclear stress test 10/19: EF 35, inferolateral, apical scar; inferior, apical inferior infarct with mild peri-infarct ischemia; high risk   Obesity    Personal history of chemotherapy    Personal history of radiation therapy    Pituitary tumor    Seasonal asthma    Sleep apnea    does not use every night   Stroke [facility])    TIA - Dec 1 st, 2017    Current Outpatient Medications on File Prior to Visit  Medication Sig Dispense Refill   Alirocumab (PRALUENT) 75 MG/ML SOAJ Inject 1 mL into the skin every 14 (fourteen) days. 6 mL 1   ALPRAZOLAM PO Take 12.5 mg by mouth 2 (two) times daily as needed (anxiety attack).     aspirin EC 81 MG tablet Take 1 tablet (81 mg total) by mouth daily. 90 tablet 3   Coenzyme Q10 100 MG capsule Take 100 mg by mouth daily.     Continuous Blood Gluc Receiver (FREESTYLE LIBRE 2 READER) DEVI Use reader to monitor blood sugars continuously. 1 each 0   Continuous Blood Gluc Sensor (FREESTYLE LIBRE 2 SENSOR) MISC USE 1 SENSOR ONCE EVERY 14  DAYS TO MONITOR BLOOD  SUGARS CONTINUOUSLY 6 each 1   furosemide (LASIX) 20 MG tablet Take 1 tablet (20 mg total) by mouth daily. For 3 days then take as need or fluid and swelling 90 tablet 1   glucose blood (FREESTYLE TEST STRIPS) test strip Use as instructed 100 each 12   HUMALOG 100 UNIT/ML cartridge INECT 3-25 UNITS INTO THE SKIN THREE TIMES DAILY WITH MEALS. USE PER SLIDING SCALE 30 mL 3   hydrALAZINE (APRESOLINE) 10 MG tablet Take 2 tablets  (20 mg total) by mouth 3 (three) times daily. 60 tablet 3   INPEN 100-BLUE-LILLY DEVI CONSULT MD FOR APP SETTINGS. 1 each 0   insulin glargine (LANTUS SOLOSTAR) 100 UNIT/ML Solostar Pen Inject 70 Units into the skin daily. 30 mL 3   liraglutide (VICTOZA) 18 MG/3ML SOPN Inject 0.6 mg for 7 days, then increase to 1.2 mg daily 6 mL 3   metoprolol succinate (TOPROL-XL) 25 MG 24 hr tablet Take 1 tablet (25 mg total) by mouth daily. Take with or immediately following a meal. 90 tablet 1   nitroGLYCERIN (NITROSTAT) 0.4 MG  SL tablet DISSOLVE ONE TABLET UNDER TONGUE AS NEEDED FOR CHEST PAIN 25 tablet 4   Omega-3 Fatty Acids (FISH OIL OMEGA-3 PO) Take 1 tablet by mouth daily.     sacubitril-valsartan (ENTRESTO) 49-51 MG Take 1 tablet by mouth 2 (two) times daily. 180 tablet 3   sertraline (ZOLOFT) 50 MG tablet Take 100 mg by mouth daily.     valACYclovir (VALTREX) 1000 MG tablet Take 1,000 mg by mouth 2 (two) times daily as needed (infection).     No current facility-administered medications on file prior to visit.    Allergies  Allergen Reactions   Hydrocodone Other (See Comments)    Severe anxiety Confusion    Invokana [Canagliflozin] Other (See Comments)    Constant yeast infections   Amlodipine Other (See Comments)    Muscles hurt   Carvedilol Other (See Comments)    Wheezing with 25mg  BID dosing NOT ALLERGIC 10/08/20   Chlorthalidone Other (See Comments)    Leg pain   Evolocumab Other (See Comments)    Injection site pain   Hydralazine Other (See Comments)    Fatigue   Imdur [Isosorbide Nitrate] Other (See Comments)    Patient reported wheezing   Jardiance [Empagliflozin] Other (See Comments)    lightheadedness, weakness, low BP and yeast infection.  NOT ALLERGIC 10/08/20   Rosuvastatin Other (See Comments)    myalgias   Simvastatin Other (See Comments)    myalgias   Spironolactone Other (See Comments)    Wheezing vs fatigue?   Atorvastatin Other (See Comments)    DIZZINESS    Compazine [Prochlorperazine] Anxiety     Assessment/Plan:  1. CHF -

## 2020-11-08 NOTE — ED Provider Notes (Signed)
[place] DEPT Provider Note   CSN: [id number] Arrival date & time: 11/08/20  0857     History Chief Complaint  Patient presents with   Panic Attack    Destiny Beasley is a 65 y.o. female.  PT is a 65yo female with a hx of depression, CAD, CHF, DM, HTN who presents with anxiety and depression.  She hasn't been sleeping well.  She has been having increased anxiety and crying spells.  She said "I want things to be over" but denies any specific thoughts of self harm.  She thinks she had a panic attack this am.  She recently was switched from effexor to zoloft by her PCP and doesn't think that it is working.  She denies any physical complaints or recent medical issues.  No cough or fevers.  No CP or SOB.      Past Medical History:  Diagnosis Date   Anemia    Anxiety    CAD in native artery    a. Inf STEMI 08/9561 complicated by cardiogenic shock/complete heart block s/p emergent cath showing culprit large dominant LCx s/p aspiration thrombectomy and DES, EF 50% by cath, 40-45% by echo.   Chronic systolic heart failure ([place]) 06/25/2017   Echo 11/19: mild LVH, EF 40-45, inf-lat HK, Gr 1 DD, trivial MR   Complete heart block, transient ([place])    a. 06/2017 in setting of acute inf MI -> resolved after PCI.   COVID-19    Diabetes mellitus    Goiter    History of radiation therapy 06/14/2016 - 07/19/2016   Right Breast 50 Gy 25 fractions   Hypercholesteremia    Hypertension    Ischemic cardiomyopathy    a. EF 50% by cath and 40-45% by echo 06/2017.   Leg pain    ABIs 07/2019: Normal (R 1.27; L 1.25)   Malignant neoplasm of upper-outer quadrant of right female breast ([place]) 02/24/2016   Meningioma ([place])    Nuclear stress test    Nuclear stress test 10/19: EF 35, inferolateral, apical scar; inferior, apical inferior infarct with mild peri-infarct ischemia; high risk   Obesity    Personal history of chemotherapy    Personal history of radiation therapy     Pituitary tumor    Seasonal asthma    Sleep apnea    does not use every night   Stroke [facility])    TIA - Dec 1 st, 2017    Patient Active Problem List   Diagnosis Date Noted   Unstable angina ([place]) 05/12/2020   Chest pain with moderate risk for cardiac etiology 06/26/2019   History of TIA (transient ischemic attack) 08/08/2017   Malignant neoplasm of breast, stage 4 ([place]) 08/08/2017   History of complete heart block in setting of STEMI 06/2017 06/29/2017   OSA on CPAP 06/29/2017   CAD (coronary artery disease) 06/29/2017   Hx of Infero-Posterior ST Elevation MI in 06/2017 06/25/2017   History of cardiogenic shock in setting of STEMI in 06/2017 87/56/4332   Chronic systolic heart failure ([place]) 06/25/2017   Malignant neoplasm of upper-outer quadrant of right female breast ([place]) 02/24/2016   Type II diabetes mellitus, uncontrolled ([place]) 02/11/2014   Essential hypertension 09/07/2013   Atypical chest pain 11/11/2011   Anxiety 11/11/2011   Insulin dependent diabetes mellitus 11/11/2011   Hyperlipidemia LDL goal <70 11/11/2011    Past Surgical History:  Procedure Laterality Date   ABDOMINAL HYSTERECTOMY     BRAIN MENINGIOMA EXCISION  2005  BREAST BIOPSY     BREAST LUMPECTOMY Right    2017   BREAST LUMPECTOMY WITH RADIOACTIVE SEED LOCALIZATION Right 04/20/2016   Procedure: RIGHT BREAST LUMPECTOMY WITH RADIOACTIVE SEED LOCALIZATION;  Surgeon: Fanny Skates, MD;  Location: [place];  Service: General;  Laterality: Right;   BREAST REDUCTION SURGERY Bilateral 10/23/2016   Procedure: BILATERAL BREAST REDUCTION WITH LIPOSUCTION ASSISTANCE;  Surgeon: Cristine Polio, MD;  Location: [place];  Service: Plastics;  Laterality: Bilateral;   CESAREAN SECTION     CORONARY STENT INTERVENTION N/A 06/25/2017   Procedure: CORONARY STENT INTERVENTION;  Surgeon: Sherren Mocha, MD;  Location: [place] CV LAB;  Service: Cardiovascular;  Laterality: N/A;    CORONARY/GRAFT ACUTE MI REVASCULARIZATION N/A 06/25/2017   Procedure: Coronary/Graft Acute MI Revascularization;  Surgeon: Sherren Mocha, MD;  Location: [place] CV LAB;  Service: Cardiovascular;  Laterality: N/A;   FOOT SURGERY Bilateral 2016   hammer toe surgery   LEFT HEART CATH AND CORONARY ANGIOGRAPHY N/A 06/25/2017   Procedure: LEFT HEART CATH AND CORONARY ANGIOGRAPHY;  Surgeon: Sherren Mocha, MD;  Location: [place] CV LAB;  Service: Cardiovascular;  Laterality: N/A;   LEFT HEART CATH AND CORONARY ANGIOGRAPHY N/A 05/13/2020   Procedure: LEFT HEART CATH AND CORONARY ANGIOGRAPHY;  Surgeon: Lorretta Harp, MD;  Location: [place] CV LAB;  Service: Cardiovascular;  Laterality: N/A;   MENISCUS REPAIR Left 2015   PITUITARY SURGERY     REDUCTION MAMMAPLASTY     RIGHT HEART CATH N/A 06/25/2017   Procedure: RIGHT HEART CATH;  Surgeon: Sherren Mocha, MD;  Location: [place] CV LAB;  Service: Cardiovascular;  Laterality: N/A;   THYROID SURGERY       OB History     Gravida  4   Para      Term      Preterm      AB      Living  2      SAB      IAB      Ectopic      Multiple      Live Births              Family History  Problem Relation Age of Onset   Diabetes Mother    Hyperlipidemia Mother    Hypertension Mother     Social History   Tobacco Use   Smoking status: Never   Smokeless tobacco: Never  Vaping Use   Vaping Use: Never used  Substance Use Topics   Alcohol use: Yes    Alcohol/week: 1.0 standard drink    Types: 1 Glasses of wine per week    Comment: 1 glass monthly   Drug use: No    Home Medications Prior to Admission medications   Medication Sig Start Date End Date Taking? Authorizing Provider  hydrOXYzine (ATARAX/VISTARIL) 25 MG tablet Take 1 tablet (25 mg total) by mouth 3 (three) times daily as needed for anxiety. 11/08/20  Yes Chalmers Guest, NP  Alirocumab (PRALUENT) 75 MG/ML SOAJ Inject 1 mL into the skin every 14 (fourteen)  days. 09/28/20   Sherren Mocha, MD  ALPRAZOLAM PO Take 12.5 mg by mouth 2 (two) times daily as needed (anxiety attack).    [provider]  aspirin EC 81 MG tablet Take 1 tablet (81 mg total) by mouth daily. 01/01/19   Richardson Dopp T, PA-C  Coenzyme Q10 100 MG capsule Take 100 mg by mouth daily.    [provider]  Continuous Blood Gluc Receiver (FREESTYLE LIBRE 2 READER) DEVI Use reader to monitor blood sugars continuously. 03/23/20   Elayne Snare, MD  Continuous Blood Gluc Sensor (FREESTYLE LIBRE 2 SENSOR) MISC USE 1 SENSOR ONCE EVERY 14  DAYS TO MONITOR BLOOD  SUGARS CONTINUOUSLY 10/07/20   Elayne Snare, MD  furosemide (LASIX) 20 MG tablet Take 1 tablet (20 mg total) by mouth daily. For 3 days then take as need or fluid and swelling 09/08/20 12/07/20  Richardson Dopp T, PA-C  glucose blood (FREESTYLE TEST STRIPS) test strip Use as instructed 06/10/20   Elayne Snare, MD  HUMALOG 100 UNIT/ML cartridge INECT 3-25 UNITS INTO THE SKIN THREE TIMES DAILY WITH MEALS. USE PER SLIDING SCALE 09/06/20   Elayne Snare, MD  hydrALAZINE (APRESOLINE) 10 MG tablet Take 2 tablets (20 mg total) by mouth 3 (three) times daily. 11/05/20   Liliane Shi, PA-C  INPEN 100-BLUE-LILLY DEVI CONSULT MD FOR APP SETTINGS. 06/10/20   Elayne Snare, MD  LANTUS SOLOSTAR 100 UNIT/ML Solostar Pen INJECT SUBCUTANEOUSLY 70  UNITS DAILY 11/08/20   Elayne Snare, MD  liraglutide (VICTOZA) 18 MG/3ML SOPN Inject 0.6 mg for 7 days, then increase to 1.2 mg daily 09/06/20   Elayne Snare, MD  metoprolol succinate (TOPROL-XL) 25 MG 24 hr tablet Take 1 tablet (25 mg total) by mouth daily. Take with or immediately following a meal. 11/01/20 04/30/21  Loel Dubonnet, NP  nitroGLYCERIN (NITROSTAT) 0.4 MG SL tablet DISSOLVE ONE TABLET UNDER TONGUE AS NEEDED FOR CHEST PAIN 05/18/20   Sherren Mocha, MD  Omega-3 Fatty Acids (FISH OIL OMEGA-3 PO) Take 1 tablet by mouth daily.    [provider]  sacubitril-valsartan (ENTRESTO) 49-51 MG Take 1  tablet by mouth 2 (two) times daily. 11/01/20   Richardson Dopp T, PA-C  sertraline (ZOLOFT) 50 MG tablet Take 100 mg by mouth daily. 08/25/20   Sherren Mocha, MD  valACYclovir (VALTREX) 1000 MG tablet Take 1,000 mg by mouth 2 (two) times daily as needed (infection). 05/02/20   [provider]    Allergies    Hydrocodone, Invokana [canagliflozin], Amlodipine, Carvedilol, Chlorthalidone, Evolocumab, Hydralazine, Imdur [isosorbide nitrate], Jardiance [empagliflozin], Rosuvastatin, Simvastatin, Spironolactone, Atorvastatin, and Compazine [prochlorperazine]  Review of Systems   Review of Systems  Constitutional:  Negative for chills, diaphoresis, fatigue and fever.  HENT:  Negative for congestion, rhinorrhea and sneezing.   Eyes: Negative.   Respiratory:  Negative for cough, chest tightness and shortness of breath.   Cardiovascular:  Negative for chest pain and leg swelling.  Gastrointestinal:  Negative for abdominal pain, blood in stool, diarrhea, nausea and vomiting.  Genitourinary:  Negative for difficulty urinating, flank pain, frequency and hematuria.  Musculoskeletal:  Negative for arthralgias and back pain.  Skin:  Negative for rash.  Neurological:  Negative for dizziness, speech difficulty, weakness, numbness and headaches.  Psychiatric/Behavioral:  Positive for dysphoric mood and sleep disturbance. Negative for suicidal ideas. The patient is nervous/anxious.    Physical Exam Updated Vital Signs BP (!) 160/101   Pulse 76   Temp 98.1 F (36.7 C) (Oral)   Resp 16   SpO2 98%   Physical Exam Constitutional:      Appearance: She is well-developed.  HENT:     Head: Normocephalic and atraumatic.  Eyes:     Pupils: Pupils are equal, round, and reactive to light.  Cardiovascular:     Rate and Rhythm: Normal rate and regular rhythm.     Heart sounds: Normal heart sounds.  Pulmonary:  Effort: Pulmonary effort is normal. No respiratory distress.     Breath sounds: Normal  breath sounds. No wheezing or rales.  Chest:     Chest wall: No tenderness.  Abdominal:     General: Bowel sounds are normal.     Palpations: Abdomen is soft.     Tenderness: There is no abdominal tenderness. There is no guarding or rebound.  Musculoskeletal:        General: Normal range of motion.     Cervical back: Normal range of motion and neck supple.  Lymphadenopathy:     Cervical: No cervical adenopathy.  Skin:    General: Skin is warm and dry.     Findings: No rash.  Neurological:     Mental Status: She is alert and oriented to person, place, and time.    ED Results / Procedures / Treatments   Labs (all labs ordered are listed, but only abnormal results are displayed) Labs Reviewed  COMPREHENSIVE METABOLIC PANEL - Abnormal; Notable for the following components:      Result Value   Glucose, Bld 215 (*)    All other components within normal limits  CBC WITH DIFFERENTIAL/PLATELET - Abnormal; Notable for the following components:   RBC 5.19 (*)    HCT 47.5 (*)    All other components within normal limits  RESP PANEL BY RT-PCR (FLU A&B, COVID) ARPGX2  ETHANOL  RAPID URINE DRUG SCREEN, HOSP PERFORMED    EKG None  Radiology No results found.  Procedures Procedures   Medications Ordered in ED Medications  hydrOXYzine (ATARAX/VISTARIL) tablet 25 mg (25 mg Oral Given 11/08/20 1128)    ED Course  I have reviewed the triage vital signs and the nursing notes.  Pertinent labs & imaging results that were available during my care of the patient were reviewed by me and considered in my medical decision making (see chart for details).    MDM Rules/Calculators/A&P                          Patient is a 65 year old female who presents with anxiety and depression.  She denies any SI or HI.  She was assessed by TTS.  She does not meet inpatient criteria.  She was given a prescription for hydroxyzine by the psychiatric team.  They have arranged for an upcoming appointment  for her.  Her labs are reviewed and are nonconcerning. Final Clinical Impression(s) / ED Diagnoses Final diagnoses:  Anxiety  Depression, unspecified depression type    Rx / DC Orders ED Discharge Orders          Ordered    hydrOXYzine (ATARAX/VISTARIL) 25 MG tablet  3 times daily PRN        11/08/20 1215             Malvin Johns, MD 11/08/20 1237

## 2020-11-08 NOTE — Telephone Encounter (Signed)
Blood pressure readings appear improved.  Hydralazine dose was just adjusted recently.  Continue to monitor over the next week.  Check blood pressure once daily and record.  Send blood pressure readings after 1 week via MyChart for review. Richardson Dopp, PA-C 7/18/202211:17 AM

## 2020-11-08 NOTE — Discharge Instructions (Signed)
For your behavioral health needs, you are advised to follow up with the Partial Hospitalization Program (PHP) at the [facility] at [place].  This program meets Monday - Friday from 9:00 am - 1:00 pm.  Due to Covid-19, this program is currently virtual.  You are scheduled for an intake appointment on Wednesday, August 3 at 2:00 pm.  [name], LCSW, who runs the program, will send you a link by e-mail 15 minutes before the appointment.  Please completed the registration packet given to you by Emergency Department staff prior to the appointment.  If you have any questions, contact Sonia Baller at the phone number indicated below:       [facility] at [facility]. [address] [place], [place] [address]      Contact person: [name], LCSW      [phone]

## 2020-11-08 NOTE — Consult Note (Signed)
[facility] Consult   Reason for Consult:  psychiatric evaluation Referring Physician:  Dr. Tamera Punt, [place] Patient Identification: Destiny Beasley MRN:  [id number] Principal Diagnosis: <principal problem not specified> Diagnosis:  Active Problems:   * No active hospital problems. *   Total Time spent with patient: 30 minutes  Subjective:   Destiny Beasley is a 65 y.o. female patient admitted with per admit note:   PT is a 65yo female with a hx of depression, CAD, CHF, DM, HTN who presents with anxiety and depression.  She hasn't been sleeping well.  She has been having increased anxiety and crying spells.  She said "I want things to be over" but denies any specific thoughts of self harm.  She thinks she had a panic attack this am.  She recently was switched from effexor to zoloft by her PCP and doesn't think that it is working.  She denies any physical complaints or recent medical issues.  No cough or fevers.  No CP or SOB.  Marland Kitchen  HPI:  Destiny Beasley is a 65 y.o female, seen by this provider face to face at [facility] ED. patient reports that she has been having panic attacks and, needs a psychiatrist.  Her goal of the visit today was due to her anxiety and panic attacks.   She is alert and oriented to person place time situation, has good eye contact throughout interview.  She is sitting calmly in the emergency department on the stretcher.  She is cooperative.  Speech is normal, volume is normal.  She denies suicidal ideation, no intent no plan, denies homicidal ideation, denies auditory and visual hallucinations.  She is able to contract for safety.  Reports that she gets her typical anxiety medication from her PCP, Dr. Harrington Challenger.  She reports that she is on Xanax 0.25 mg twice per day, and she recently switched to Zoloft for her anxiety.  Reports that she sleeps okay, appetite is okay, is compliant with current medications, wishes to have more intensive outpatient services.  Past Psychiatric  History: depression and anxiety  Risk to Self:  no Risk to Others:  no Prior Inpatient Therapy:   Prior Outpatient Therapy:    Past Medical History:  Past Medical History:  Diagnosis Date   Anemia    Anxiety    CAD in native artery    a. Inf STEMI 10/3417 complicated by cardiogenic shock/complete heart block s/p emergent cath showing culprit large dominant LCx s/p aspiration thrombectomy and DES, EF 50% by cath, 40-45% by echo.   Chronic systolic heart failure ([place]) 06/25/2017   Echo 11/19: mild LVH, EF 40-45, inf-lat HK, Gr 1 DD, trivial MR   Complete heart block, transient ([place])    a. 06/2017 in setting of acute inf MI -> resolved after PCI.   COVID-19    Diabetes mellitus    Goiter    History of radiation therapy 06/14/2016 - 07/19/2016   Right Breast 50 Gy 25 fractions   Hypercholesteremia    Hypertension    Ischemic cardiomyopathy    a. EF 50% by cath and 40-45% by echo 06/2017.   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   Meningioma [facility])    Nuclear stress test    Nuclear stress test 10/19: EF 35, inferolateral, apical scar; inferior, apical inferior infarct with mild peri-infarct ischemia; high risk   Obesity  Personal history of chemotherapy    Personal history of radiation therapy    Pituitary tumor    Seasonal asthma    Sleep apnea    does not use every night   Stroke [facility])    TIA - Dec 1 st, 2017    Past Surgical History:  Procedure Laterality Date   ABDOMINAL HYSTERECTOMY     BRAIN MENINGIOMA EXCISION  2005   BREAST BIOPSY     BREAST LUMPECTOMY Right    2017   BREAST LUMPECTOMY WITH RADIOACTIVE SEED LOCALIZATION Right 04/20/2016   Procedure: RIGHT BREAST LUMPECTOMY WITH RADIOACTIVE SEED LOCALIZATION;  Surgeon: Fanny Skates, MD;  Location: [facility];  Service: General;  Laterality: Right;   BREAST REDUCTION SURGERY Bilateral 10/23/2016   Procedure:  BILATERAL BREAST REDUCTION WITH LIPOSUCTION ASSISTANCE;  Surgeon: Cristine Polio, MD;  Location: [facility];  Service: Plastics;  Laterality: Bilateral;   CESAREAN SECTION     CORONARY STENT INTERVENTION N/A 06/25/2017   Procedure: CORONARY STENT INTERVENTION;  Surgeon: Sherren Mocha, MD;  Location: [place] [facility];  Service: Cardiovascular;  Laterality: N/A;   CORONARY/GRAFT ACUTE MI REVASCULARIZATION N/A 06/25/2017   Procedure: Coronary/Graft Acute MI Revascularization;  Surgeon: Sherren Mocha, MD;  Location: [place] [facility];  Service: Cardiovascular;  Laterality: N/A;   FOOT SURGERY Bilateral 2016   hammer toe surgery   LEFT HEART CATH AND CORONARY ANGIOGRAPHY N/A 06/25/2017   Procedure: LEFT HEART CATH AND CORONARY ANGIOGRAPHY;  Surgeon: Sherren Mocha, MD;  Location: [place] [facility];  Service: Cardiovascular;  Laterality: N/A;   LEFT HEART CATH AND CORONARY ANGIOGRAPHY N/A 05/13/2020   Procedure: LEFT HEART CATH AND CORONARY ANGIOGRAPHY;  Surgeon: Lorretta Harp, MD;  Location: [place] [facility];  Service: Cardiovascular;  Laterality: N/A;   MENISCUS REPAIR Left 2015   PITUITARY SURGERY     REDUCTION MAMMAPLASTY     RIGHT HEART CATH N/A 06/25/2017   Procedure: RIGHT HEART CATH;  Surgeon: Sherren Mocha, MD;  Location: [place] [facility];  Service: Cardiovascular;  Laterality: N/A;   THYROID SURGERY     Family History:  Family History  Problem Relation Age of Onset   Diabetes Mother    Hyperlipidemia Mother    Hypertension Mother    Family Psychiatric  History:  Social History:  Social History   Substance and Sexual Activity  Alcohol Use Yes   Alcohol/week: 1.0 standard drink   Types: 1 Glasses of wine per week   Comment: 1 glass monthly     Social History   Substance and Sexual Activity  Drug Use No    Social History   Socioeconomic History   Marital status: Single    Spouse name: Not on file   Number of children: Not on file   Years of  education: Not on file   Highest education level: Not on file  Occupational History   Occupation: full time  Tobacco Use   Smoking status: Never   Smokeless tobacco: Never  Vaping Use   Vaping Use: Never used  Substance and Sexual Activity   Alcohol use: Yes    Alcohol/week: 1.0 standard drink    Types: 1 Glasses of wine per week    Comment: 1 glass monthly   Drug use: No   Sexual activity: Not on file    Comment: no cycles; pt had hyst  Other Topics Concern   Not on file  Social History Narrative   Lives alone  Right Handed   Drinks 1-2 cups caffeine daily   Social Determinants of Health   Financial Resource Strain: Not on file  Food Insecurity: Not on file  Transportation Needs: Not on file  Physical Activity: Not on file  Stress: Not on file  Social Connections: Not on file   Additional Social History:    Allergies:   Allergies  Allergen Reactions   Hydrocodone Other (See Comments)    Severe anxiety Confusion    Invokana [Canagliflozin] Other (See Comments)    Constant yeast infections   Amlodipine Other (See Comments)    Muscles hurt   Carvedilol Other (See Comments)    Wheezing with 25mg  BID dosing NOT ALLERGIC 10/08/20   Chlorthalidone Other (See Comments)    Leg pain   Evolocumab Other (See Comments)    Injection site pain   Hydralazine Other (See Comments)    Fatigue   Imdur [Isosorbide Nitrate] Other (See Comments)    Patient reported wheezing   Jardiance [Empagliflozin] Other (See Comments)    lightheadedness, weakness, low BP and yeast infection.  NOT ALLERGIC 10/08/20   Rosuvastatin Other (See Comments)    myalgias   Simvastatin Other (See Comments)    myalgias   Spironolactone Other (See Comments)    Wheezing vs fatigue?   Atorvastatin Other (See Comments)    DIZZINESS   Compazine [Prochlorperazine] Anxiety    Labs:  Results for orders placed or performed during the hospital encounter of 11/08/20 (from the past 48 hour(s))  Resp  Panel by RT-PCR (Flu A&B, Covid) Nasopharyngeal Swab     Status: None   Collection Time: 11/08/20 10:24 AM   Specimen: Nasopharyngeal Swab; Nasopharyngeal(NP) swabs in vial transport medium  Result Value Ref Range   SARS Coronavirus 2 by RT PCR NEGATIVE NEGATIVE    Comment: (NOTE) SARS-CoV-2 target nucleic acids are NOT DETECTED.  The SARS-CoV-2 RNA is generally detectable in upper respiratory specimens during the acute phase of infection. The lowest concentration of SARS-CoV-2 viral copies this assay can detect is 138 copies/mL. A negative result does not preclude SARS-Cov-2 infection and should not be used as the sole basis for treatment or other patient management decisions. A negative result may occur with  improper specimen collection/handling, submission of specimen other than nasopharyngeal swab, presence of viral mutation(s) within the areas targeted by this assay, and inadequate number of viral copies(<138 copies/mL). A negative result must be combined with clinical observations, patient history, and epidemiological information. The expected result is Negative.  Fact Sheet for Patients:  EntrepreneurPulse.com.au  Fact Sheet for Healthcare Providers:  IncredibleEmployment.be  This test is no t yet approved or cleared by the [place] FDA and  has been authorized for detection and/or diagnosis of SARS-CoV-2 by FDA under an Emergency Use Authorization (EUA). This EUA will remain  in effect (meaning this test can be used) for the duration of the COVID-19 declaration under Section 564(b)(1) of the Act, 21 U.S.C.section 360bbb-3(b)(1), unless the authorization is terminated  or revoked sooner.       Influenza A by PCR NEGATIVE NEGATIVE   Influenza B by PCR NEGATIVE NEGATIVE    Comment: (NOTE) The Xpert Xpress SARS-CoV-2/FLU/RSV plus assay is intended as an aid in the diagnosis of influenza from Nasopharyngeal swab specimens  and should not be used as a sole basis for treatment. Nasal washings and aspirates are unacceptable for Xpert Xpress SARS-CoV-2/FLU/RSV testing.  Fact Sheet for Patients: EntrepreneurPulse.com.au  Fact Sheet for Healthcare Providers: IncredibleEmployment.be  This test  is not yet approved or cleared by the [place] and has been authorized for detection and/or diagnosis of SARS-CoV-2 by FDA under an Emergency Use Authorization (EUA). This EUA will remain in effect (meaning this test can be used) for the duration of the COVID-19 declaration under Section 564(b)(1) of the Act, 21 U.S.C. section 360bbb-3(b)(1), unless the authorization is terminated or revoked.  Performed at [facility], [place] [address] [place], [place] [address]   Comprehensive metabolic panel     Status: Abnormal   Collection Time: 11/08/20 10:28 AM  Result Value Ref Range   Sodium 139 135 - 145 mmol/L   Potassium 4.0 3.5 - 5.1 mmol/L   Chloride 105 98 - 111 mmol/L   CO2 27 22 - 32 mmol/L   Glucose, Bld 215 (H) 70 - 99 mg/dL    Comment: Glucose reference range applies only to samples taken after fasting for at least 8 hours.   BUN 13 8 - 23 mg/dL   Creatinine, Ser 0.71 0.44 - 1.00 mg/dL   Calcium 9.1 8.9 - 10.3 mg/dL   Total Protein 7.4 6.5 - 8.1 g/dL   Albumin 3.8 3.5 - 5.0 g/dL   AST 24 15 - 41 U/L   ALT 24 0 - 44 U/L   Alkaline Phosphatase 88 38 - 126 U/L   Total Bilirubin 0.5 0.3 - 1.2 mg/dL   GFR, Estimated >60 >60 mL/min    Comment: (NOTE) Calculated using the CKD-EPI Creatinine Equation (2021)    Anion gap 7 5 - 15    Comment: Performed at [facility], [place] [address], [place], [place] [address]  CBC with Diff     Status: Abnormal   Collection Time: 11/08/20 10:28 AM  Result Value Ref Range   WBC 5.7 4.0 - 10.5 K/uL   RBC 5.19 (H) 3.87 - 5.11 MIL/uL   Hemoglobin 15.0 12.0 - 15.0 g/dL   HCT 47.5 (H) 36.0 - 46.0 %    MCV 91.5 80.0 - 100.0 fL   MCH 28.9 26.0 - 34.0 pg   MCHC 31.6 30.0 - 36.0 g/dL   RDW 13.8 11.5 - 15.5 %   Platelets 325 150 - 400 K/uL   nRBC 0.0 0.0 - 0.2 %   Neutrophils Relative % 54 %   Neutro Abs 3.1 1.7 - 7.7 K/uL   Lymphocytes Relative 36 %   Lymphs Abs 2.1 0.7 - 4.0 K/uL   Monocytes Relative 6 %   Monocytes Absolute 0.4 0.1 - 1.0 K/uL   Eosinophils Relative 3 %   Eosinophils Absolute 0.2 0.0 - 0.5 K/uL   Basophils Relative 1 %   Basophils Absolute 0.0 0.0 - 0.1 K/uL   Immature Granulocytes 0 %   Abs Immature Granulocytes 0.01 0.00 - 0.07 K/uL    Comment: Performed at [facility], [place] [address] [place], [place] [address]  Ethanol     Status: None   Collection Time: 11/08/20 10:29 AM  Result Value Ref Range   Alcohol, Ethyl (B) <10 <10 mg/dL    Comment: (NOTE) Lowest detectable limit for serum alcohol is 10 mg/dL.  For medical purposes only. Performed at [facility], [place] [address], [place], [place] [address]     Current Facility-Administered Medications  Medication Dose Route Frequency Provider Last Rate Last Admin   hydrOXYzine (ATARAX/VISTARIL) tablet 25 mg  25 mg Oral TID PRN Chalmers Guest, NP   25 mg at 11/08/20 1128  Current Outpatient Medications  Medication Sig Dispense Refill   hydrOXYzine (ATARAX/VISTARIL) 25 MG tablet Take 1 tablet (25 mg total) by mouth 3 (three) times daily as needed for anxiety. 30 tablet 0   Alirocumab (PRALUENT) 75 MG/ML SOAJ Inject 1 mL into the skin every 14 (fourteen) days. 6 mL 1   ALPRAZOLAM PO Take 12.5 mg by mouth 2 (two) times daily as needed (anxiety attack).     aspirin EC 81 MG tablet Take 1 tablet (81 mg total) by mouth daily. 90 tablet 3   Coenzyme Q10 100 MG capsule Take 100 mg by mouth daily.     Continuous Blood Gluc Receiver (FREESTYLE LIBRE 2 READER) DEVI Use reader to monitor blood sugars continuously. 1 each 0   Continuous Blood Gluc Sensor (FREESTYLE LIBRE 2 SENSOR)  MISC USE 1 SENSOR ONCE EVERY 14  DAYS TO MONITOR BLOOD  SUGARS CONTINUOUSLY 6 each 1   furosemide (LASIX) 20 MG tablet Take 1 tablet (20 mg total) by mouth daily. For 3 days then take as need or fluid and swelling 90 tablet 1   glucose blood (FREESTYLE TEST STRIPS) test strip Use as instructed 100 each 12   HUMALOG 100 UNIT/ML cartridge INECT 3-25 UNITS INTO THE SKIN THREE TIMES DAILY WITH MEALS. USE PER SLIDING SCALE 30 mL 3   hydrALAZINE (APRESOLINE) 10 MG tablet Take 2 tablets (20 mg total) by mouth 3 (three) times daily. 60 tablet 3   INPEN 100-BLUE-LILLY DEVI CONSULT MD FOR APP SETTINGS. 1 each 0   LANTUS SOLOSTAR 100 UNIT/ML Solostar Pen INJECT SUBCUTANEOUSLY 70  UNITS DAILY 75 mL 3   liraglutide (VICTOZA) 18 MG/3ML SOPN Inject 0.6 mg for 7 days, then increase to 1.2 mg daily 6 mL 3   metoprolol succinate (TOPROL-XL) 25 MG 24 hr tablet Take 1 tablet (25 mg total) by mouth daily. Take with or immediately following a meal. 90 tablet 1   nitroGLYCERIN (NITROSTAT) 0.4 MG SL tablet DISSOLVE ONE TABLET UNDER TONGUE AS NEEDED FOR CHEST PAIN 25 tablet 4   Omega-3 Fatty Acids (FISH OIL OMEGA-3 PO) Take 1 tablet by mouth daily.     sacubitril-valsartan (ENTRESTO) 49-51 MG Take 1 tablet by mouth 2 (two) times daily. 180 tablet 3   sertraline (ZOLOFT) 50 MG tablet Take 100 mg by mouth daily.     valACYclovir (VALTREX) 1000 MG tablet Take 1,000 mg by mouth 2 (two) times daily as needed (infection).      Musculoskeletal: Strength & Muscle Tone: within normal limits Gait & Station: normal Patient leans: N/A     Psychiatric Specialty Exam:  Presentation  General Appearance:  Appropriate for Environment Eye Contact: Good Speech: Clear and Coherent; Normal Rate Speech Volume: Normal Handedness: No data recorded  Mood and Affect  Mood: Anxious Affect: Appropriate; Congruent  Thought Process  Thought Processes: Coherent; Goal Directed Descriptions of  Associations:Intact Orientation:Full (Time, Place and Person) Thought Content:Logical History of Schizophrenia/Schizoaffective disorder:No data recorded Duration of Psychotic Symptoms:No data recorded Hallucinations:Hallucinations: None Ideas of Reference:None Suicidal Thoughts:Suicidal Thoughts: No Homicidal Thoughts:Homicidal Thoughts: No  Sensorium  Memory: Immediate Good; Recent Good; Remote Good Judgment: Good Insight: Good  Executive Functions  Concentration: Good Attention Span: Good Recall: Good Fund of Knowledge: Good Language: Good  Psychomotor Activity  Psychomotor Activity: Psychomotor Activity: Normal  Assets  Assets: Desire for Improvement; Communication Skills; Financial Resources/Insurance; Housing; Social Support  Sleep  Sleep: Sleep: Good  Physical Exam: Physical Exam Constitutional:      Appearance: Normal appearance.  Cardiovascular:     Rate and Rhythm: Normal rate.  Pulmonary:     Effort: Pulmonary effort is normal.  Neurological:     Mental Status: She is alert and oriented to person, place, and time.  Psychiatric:        Attention and Perception: Attention normal.        Mood and Affect: Mood is anxious.        Speech: Speech normal.        Behavior: Behavior normal. Behavior is cooperative.        Thought Content: Thought content is not paranoid or delusional. Thought content does not include homicidal or suicidal ideation. Thought content does not include homicidal or suicidal plan.   Review of Systems  Constitutional:  Negative for chills and fever.  Respiratory:  Negative for shortness of breath.   Cardiovascular:  Negative for chest pain.  Neurological:  Negative for headaches.  Psychiatric/Behavioral:  Negative for depression, hallucinations, substance abuse and suicidal ideas. The patient is nervous/anxious.   Blood pressure (!) 160/101, pulse 76, temperature 98.1 F (36.7 C), temperature source Oral, resp. rate 16, SpO2  98 %. There is no height or weight on file to calculate BMI.  Treatment Plan Summary: Plan Safe for outpatient treatment. Will order short course of hydroxyzine for panic attacks until outpatient can take over. # 30 hydroxyzine 25 mg PO TID prn sent to [facility] on [address]. Understands that outpatient provider will continue prescribing this in the future.   Disposition: No evidence of imminent risk to self or others at present.   Patient does not meet criteria for psychiatric inpatient admission. Supportive therapy provided about ongoing stressors. Refer to IOP. Discussed crisis plan, support from social network, calling 911, coming to the Emergency Department, and calling Suicide Hotline. Per triage specialist, patient has appointment for PHP on 8/3. Agrees with plan for intensive outpatient resources with PHP.   Chalmers Guest, NP 11/08/2020 12:27 PM

## 2020-11-08 NOTE — BH Assessment (Signed)
[facility] Assessment Progress Note   Per Pecolia Ades, NP, this voluntary pt does not require psychiatric hospitalization at this time.  Pt is psychiatrically cleared.  Pt would benefit from the Partial Hospitalization Program at the [facility] at [facility], which is currently being offered virtually due to Covid-19.  This Probation officer has spoken to pt and she would like to enroll; she adds that she has necessary IT support to participate in virtual programming.  I have spoken to [name], LCSW, who has scheduled pt for intake on Wednesday, 11/24/2020  at 14:00 This has been included in pt's discharge instructions.  Pt has been given a registration packet for the Outpatient Clinic, which she has been instructed to complete prior to the appointed time.  Contacts for pt have been sent to [facility] by e-mail.  EDP Malvin Johns, MD, Santiago Glad and pt's nurse, Cleotis Lema, have been notified.  [name], [place] Triage Specialist [phone]

## 2020-11-08 NOTE — ED Triage Notes (Signed)
Pt BIB EMS. Per pts daughter pt was having a panic attack; pt has hx of panic attacks. Pt stated "I want things to be over with". Denies wanting to hurt herself. Pt states she is depressed and does not sleep well.

## 2020-11-10 ENCOUNTER — Encounter (HOSPITAL_BASED_OUTPATIENT_CLINIC_OR_DEPARTMENT_OTHER): Payer: Self-pay

## 2020-11-15 NOTE — Telephone Encounter (Addendum)
**Note De-Identified Destiny Beasley Obfuscation** Letter received from [facility] Destiny Beasley fax stating that they denied the pts [place] PA,  I did this [employer] over the phone on 7/11 and did get an approval.  I called [facility] and was advised that the pt picked her Entresto up on 7/14, her ins covered it, and that her cost was Zero.

## 2020-11-16 ENCOUNTER — Telehealth: Payer: Self-pay | Admitting: Endocrinology

## 2020-11-16 NOTE — Telephone Encounter (Signed)
"  please evaluate for hyperthyroidism and Hx pituitary tumor in addition to her diabetes"  Dr Lona Kettle asked this. Docs are scanned in media from him.

## 2020-11-17 NOTE — Telephone Encounter (Signed)
Will review and discuss when patient comes in

## 2020-11-24 ENCOUNTER — Other Ambulatory Visit (HOSPITAL_COMMUNITY): Payer: 59 | Attending: Psychiatry

## 2020-11-24 ENCOUNTER — Other Ambulatory Visit: Payer: Self-pay

## 2020-11-24 ENCOUNTER — Other Ambulatory Visit: Payer: Self-pay | Admitting: Endocrinology

## 2020-11-24 DIAGNOSIS — F411 Generalized anxiety disorder: Secondary | ICD-10-CM | POA: Insufficient documentation

## 2020-11-24 DIAGNOSIS — R7989 Other specified abnormal findings of blood chemistry: Secondary | ICD-10-CM

## 2020-11-29 ENCOUNTER — Other Ambulatory Visit: Payer: Self-pay

## 2020-11-29 ENCOUNTER — Encounter (HOSPITAL_COMMUNITY): Payer: Self-pay

## 2020-11-29 ENCOUNTER — Other Ambulatory Visit (HOSPITAL_COMMUNITY): Payer: 59 | Admitting: Occupational Therapy

## 2020-11-29 ENCOUNTER — Other Ambulatory Visit (HOSPITAL_COMMUNITY): Payer: 59 | Admitting: Licensed Clinical Social Worker

## 2020-11-29 DIAGNOSIS — F411 Generalized anxiety disorder: Secondary | ICD-10-CM | POA: Diagnosis not present

## 2020-11-29 DIAGNOSIS — F410 Panic disorder [episodic paroxysmal anxiety] without agoraphobia: Secondary | ICD-10-CM

## 2020-11-29 DIAGNOSIS — R4589 Other symptoms and signs involving emotional state: Secondary | ICD-10-CM

## 2020-11-29 DIAGNOSIS — I25118 Atherosclerotic heart disease of native coronary artery with other forms of angina pectoris: Secondary | ICD-10-CM

## 2020-11-29 DIAGNOSIS — R41844 Frontal lobe and executive function deficit: Secondary | ICD-10-CM

## 2020-11-29 NOTE — Therapy (Signed)
[place], [place], [address] Phone: [phone]   Fax:  [phone] Virtual Visit via Video Note  I connected with Destiny Beasley on 11/29/20 at  11:00 AM EDT by a video enabled telemedicine application and verified that I am speaking with the correct person using two identifiers.  Location: Patient: Patient Home Provider: Clinic Office    I discussed the limitations of evaluation and management by telemedicine and the availability of in person appointments. The patient expressed understanding and agreed to proceed.   I discussed the assessment and treatment plan with the patient. The patient was provided an opportunity to ask questions and all were answered. The patient agreed with the plan and demonstrated an understanding of the instructions.   The patient was advised to call back or seek an in-person evaluation if the symptoms worsen or if the condition fails to improve as anticipated.  I provided 73 minutes of non-face-to-face time during this encounter. 60 minutes OT Group 13 minutes OT Evaluation  Ponciano Ort, OT   Occupational Therapy Evaluation  Patient Details  Name: Destiny Beasley MRN: [id number] Date of Birth: 06/01/1955 Referring Provider (OT): Ricky Ala   Encounter Date: 11/29/2020   OT End of Session - 11/29/20 1227     Visit Number 1    Number of Visits 20    Date for OT Re-Evaluation 12/27/20    OT Start Time 1110   OT Eval [phone]   OT Stop Time 1210    OT Time Calculation (min) 60 min    Activity Tolerance Patient tolerated treatment well    Behavior During Therapy WFL for tasks assessed/performed             Past Medical History:  Diagnosis Date   Anemia    Anxiety    CAD in native artery    a. Inf STEMI [id number] complicated by cardiogenic shock/complete heart block s/p emergent cath showing culprit large dominant LCx s/p aspiration thrombectomy and DES,  EF 50% by cath, 40-45% by echo.   Chronic systolic heart failure ([place]) 06/25/2017   Echo 11/19: mild LVH, EF 40-45, inf-lat HK, Gr 1 DD, trivial MR   Complete heart block, transient ([place])    a. 06/2017 in setting of acute inf MI -> resolved after PCI.   COVID-19    Diabetes mellitus    Goiter    History of radiation therapy 06/14/2016 - 07/19/2016   Right Breast 50 Gy 25 fractions   Hypercholesteremia    Hypertension    Ischemic cardiomyopathy    a. EF 50% by cath and 40-45% by echo 06/2017.   Leg pain    ABIs 07/2019: Normal (R 1.27; L 1.25)   Malignant neoplasm of upper-outer quadrant of right female breast ([place]) 02/24/2016   Meningioma [facility])    Nuclear stress test    Nuclear stress test 10/19: EF 35, inferolateral, apical scar; inferior, apical inferior infarct with mild peri-infarct ischemia; high risk   Obesity    Personal history of chemotherapy    Personal history of radiation therapy    Pituitary tumor    Seasonal asthma    Sleep apnea    does not use every night   Stroke [facility])    TIA - Dec 1 st, 2017    Past Surgical History:  Procedure Laterality Date   ABDOMINAL HYSTERECTOMY     BRAIN MENINGIOMA EXCISION  2005   BREAST BIOPSY  BREAST LUMPECTOMY Right    2017   BREAST LUMPECTOMY WITH RADIOACTIVE SEED LOCALIZATION Right 04/20/2016   Procedure: RIGHT BREAST LUMPECTOMY WITH RADIOACTIVE SEED LOCALIZATION;  Surgeon: Fanny Skates, MD;  Location: [place];  Service: General;  Laterality: Right;   BREAST REDUCTION SURGERY Bilateral 10/23/2016   Procedure: BILATERAL BREAST REDUCTION WITH LIPOSUCTION ASSISTANCE;  Surgeon: Cristine Polio, MD;  Location: [place];  Service: Plastics;  Laterality: Bilateral;   CESAREAN SECTION     CORONARY STENT INTERVENTION N/A 06/25/2017   Procedure: CORONARY STENT INTERVENTION;  Surgeon: Sherren Mocha, MD;  Location: [place] CV LAB;  Service: Cardiovascular;  Laterality: N/A;   CORONARY/GRAFT ACUTE MI  REVASCULARIZATION N/A 06/25/2017   Procedure: Coronary/Graft Acute MI Revascularization;  Surgeon: Sherren Mocha, MD;  Location: [place] CV LAB;  Service: Cardiovascular;  Laterality: N/A;   FOOT SURGERY Bilateral 2016   hammer toe surgery   LEFT HEART CATH AND CORONARY ANGIOGRAPHY N/A 06/25/2017   Procedure: LEFT HEART CATH AND CORONARY ANGIOGRAPHY;  Surgeon: Sherren Mocha, MD;  Location: [place] CV LAB;  Service: Cardiovascular;  Laterality: N/A;   LEFT HEART CATH AND CORONARY ANGIOGRAPHY N/A 05/13/2020   Procedure: LEFT HEART CATH AND CORONARY ANGIOGRAPHY;  Surgeon: Lorretta Harp, MD;  Location: [place] CV LAB;  Service: Cardiovascular;  Laterality: N/A;   MENISCUS REPAIR Left 2015   PITUITARY SURGERY     REDUCTION MAMMAPLASTY     RIGHT HEART CATH N/A 06/25/2017   Procedure: RIGHT HEART CATH;  Surgeon: Sherren Mocha, MD;  Location: [place] CV LAB;  Service: Cardiovascular;  Laterality: N/A;   THYROID SURGERY      There were no vitals filed for this visit.   Subjective Assessment - 11/29/20 1225     Currently in Pain? No/denies    Pain Score 0-No pain               [facility] OT Assessment - 11/29/20 0001       Assessment   Medical Diagnosis Generalized anxiety disorder    Referring Provider (OT) Ricky Ala      Precautions   Precautions None      Restrictions   Weight Bearing Restrictions No      Balance Screen   Has the patient fallen in the past 6 months No    Has the patient had a decrease in activity level because of a fear of falling?  No    Is the patient reluctant to leave their home because of a fear of falling?  No             OT Education - 11/29/20 1225     Education Details Educated on OT role within PHP in addition to self-care and provided tips/strategies to improving specific areas of self-care as it relates to one's mental health    Person(s) Educated Patient    Methods Explanation;Handout    Comprehension Verbalized  understanding              OT Short Term Goals - 11/29/20 1232       OT SHORT TERM GOAL #1   Title Pt will actively engage in OT group sessions throughout duration of PHP programming, in order to promote daily structure, social engagement, and opportunities to develop and utilize adaptive strategies to maximize functional performance in preparation for safe transition and integration back into school, work, and the community.    Time 4    Period Weeks  Status New    Target Date 12/27/20      OT SHORT TERM GOAL #2   Title Pt will identify and implement 1-3 sleep hygiene strategies she can utilize, in order to improve sleep quality/ADL performance, in preparation for safe and healthy reintegration back into the community at discharge.    Time 4    Period Weeks    Status New    Target Date 12/27/20      OT SHORT TERM GOAL #3   Title Pt will identify 1-3 stress management strategies she can utilize, in order to safely manage increased psychosocial stressors identified, with min cues, in preparation for safe transition back to the community at discharge.    Time 4    Period Weeks    Status New    Target Date 12/27/20           Occupational Therapy Assessment 11/29/2020  Destiny Beasley is a 65 y/o female with PMHx of generalized anxiety, PTSD, and panic attacks who was referred to the [facility] program from the ED with reports of new onset panic attacks over the last 4-5 months. Pt reports that onset of anxiety and panic began when she started to live alone/on her own 2 years ago. Previously, patient reports living with family and previous to that, her husband (who has since passed away). 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 Pt reports difficulty engaging in ADL/iADLs, however has become more motivated with support from family and friends. Pt reports a desire to engage in [facility] programming in order to manage identified stressors and to engage  meaningfully in identified areas of occupation and ADL/iADLs. Upon approach, pt presents as calm, cooperative, bright, and forthcoming with information for OT evaluation. Pt reports enjoying travelling and watching her grandson play sports and identifies goal for admission "manage these attacks".   Precautions/Limitations: None noted/observed  Cognition: WFL   Visual Motor: WFL, wears glasses for reading   Living Situation: Pt lives alone, reports she does not like living alone, will be 2 years in Sept  School/Work: Pt works FT as a Careers adviser; has been employed for 30+ years   ADL/iADL Performance: Pt reports difficulty engaging in basic ADLs including showering, eating, and sleeping regularly   Leisure Hydrologist: Enjoys travelling, sports, going to the gym, and being outside and being social  Social Support: Family, adult children, best friend   What do you do when you are very stressed, angry, upset, sad or anxious? Cry, Sleep, and Write in a journal   What helps when you are not feeling well? Lying down with a cold face cloth, Wrapping in a blanket, Deep breathing, Additional/Extra medication, Exercise, Watching TV, Eating something, and Writing in a journal  What are some things that make it MORE difficult for you when you are already upset? Being touched and Yelling  Is there anything specific that you would like help with while you're in the partial hospitalization program? Impulse Control, Medication , Stress Management, Sleep, Nutrition, and Exercise  What is your goal while you are here?  "Control these panic attacks"  Assessment: Pt demonstrates behavior that inhibits/restricts participation in occupation and would benefit from skilled occupational therapy services to address current difficulties with symptom management, emotion regulation, socialization, stress management, time management, job readiness, financial wellness, health and nutrition, sleep hygiene,  ADL/iADL performance and leisure participation, in preparation for reintegration and return to community at discharge.   Plan:  Pt will participate in skilled occupational therapy sessions (group and/or individual) in order to promote daily structure, social engagement, and opportunities to develop and utilize adaptive strategies to maximize functional performance in preparation for safe transition and integration back into school, work, and/or the community at discharge. OT sessions will occur 4-5 x per week for 2-4 weeks.   Ponciano Ort, MOT, OTR/L  Group Session:  S: "I like to travel a lot and get my hair and nails done."  O: Today's group session focused on the topic of self-care and group member completed a self-care assessment that identified specific categories within self-care that needed improvement, including physical, emotional/psychological, social, spiritual, and professional. Discussion focused on identifying which areas need the most work/improvement and brainstormed strategies and tips to improve self-care. Group members were also encouraged to identify strengths and areas of improvement.     A:  Biancha was active in her participation of discussion and activity, sharing that self-care for her is engaging in activities that she enjoys, such as travelling and getting her nails done. Pt identified several areas/activities of self-care strength including going to preventative medical appointments, engaging in hygiene, and asking for help. Pt identified areas of improvement as exercising and eating regularly/more balanced. Pt receptive to feedback, education, and support offered during session.  P: Continue to attend PHP OT group sessions 5x week for 4 weeks to promote daily structure, social engagement, and opportunities to develop and utilize adaptive strategies to maximize functional performance in preparation for safe transition and integration back into school, work, and the  community. Plan to address topic of stress management in next OT group session.  Plan - 11/29/20 1227     Clinical Impression Statement Destiny Beasley is a 65 y/o female with PMHx of generalized anxiety, PTSD, and panic attacks who was referred to the [facility] program from the ED with reports of new onset panic attacks over the last 4-5 months. Pt reports that onset of anxiety and panic began when she started to live alone/on her own 2 years ago. Previously, patient reports living with family and previous to that, her husband (who has since passed away). 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 Pt reports difficulty engaging in ADL/iADLs, however has become more motivated with support from family and friends. Pt reports a desire to engage in [facility] programming in order to manage identified stressors and to engage meaningfully in identified areas of occupation and ADL/iADLs.    OT Occupational Profile and History Problem Focused Assessment - Including review of records relating to presenting problem    Occupational performance deficits (Please refer to evaluation for details): ADL's;IADL's;Rest and Sleep;Education;Work;Leisure;Social Participation    Body Structure / Function / Physical Skills ADL    Cognitive Skills Attention;Emotional;Energy/Drive;Learn;Memory;Perception;Problem Solve;Safety Awareness;Temperament/Personality;Thought;Understand    Psychosocial Skills Coping Strategies;Environmental  Adaptations;Habits;Interpersonal Interaction;Routines and Behaviors    Rehab Potential Good    Clinical Decision Making Limited treatment options, no task modification necessary    Comorbidities Affecting Occupational Performance: May have comorbidities impacting occupational performance    Modification or Assistance to Complete Evaluation  No modification of tasks or assist necessary to complete eval    OT Frequency 5x / week    OT Duration 4 weeks    OT  Treatment/Interventions Self-care/ADL training;Patient/family education;Coping strategies training;Psychosocial skills training    Consulted and Agree with Plan of Care Patient             Patient will benefit  from skilled therapeutic intervention in order to improve the following deficits and impairments:   Body Structure / Function / Physical Skills: ADL Cognitive Skills: Attention, Emotional, Energy/Drive, Learn, Memory, Perception, Problem Solve, Safety Awareness, Temperament/Personality, Thought, Understand Psychosocial Skills: Coping Strategies, Environmental  Adaptations, Habits, Interpersonal Interaction, Routines and Behaviors   Visit Diagnosis: Difficulty coping  Frontal lobe and executive function deficit  Generalized anxiety disorder with panic attacks    Problem List Patient Active Problem List   Diagnosis Date Noted   Unstable angina ([place]) 05/12/2020   Chest pain with moderate risk for cardiac etiology 06/26/2019   History of TIA (transient ischemic attack) 08/08/2017   Malignant neoplasm of breast, stage 4 ([place]) 08/08/2017   History of complete heart block in setting of STEMI 06/2017 06/29/2017   OSA on CPAP 06/29/2017   CAD (coronary artery disease) 06/29/2017   Hx of Infero-Posterior ST Elevation MI in 06/2017 06/25/2017   History of cardiogenic shock in setting of STEMI in 06/2017 0000000   Chronic systolic heart failure ([place]) 06/25/2017   Malignant neoplasm of upper-outer quadrant of right female breast ([place]) 02/24/2016   Type II diabetes mellitus, uncontrolled ([place]) 02/11/2014   Essential hypertension 09/07/2013   Atypical chest pain 11/11/2011   Anxiety 11/11/2011   Insulin dependent diabetes mellitus 11/11/2011   Hyperlipidemia LDL goal <70 11/11/2011    11/29/2020  Ponciano Ort, MOT, OTR/L  11/29/2020, 12:33 PM  [place], [place], [address] Phone: [phone]    Fax:  [phone]  Name: LAIONNA BARRIONUEVO MRN: [id number] Date of Birth: [birth year]

## 2020-11-29 NOTE — Progress Notes (Signed)
Spoke with patient via Webex video call, used 2 identifiers to correctly identify patient. This is her first time in [facility] after going to [facility] for help with anxiety. States that she goes to the ED at least twice weekly for panic attacks. They recommended PHP. She is not aware of any triggers to her anxiety. Her body becomes hot and then her mind races. Has been having them since 2021. Lately has been having "crazy dreams" and nightmares. Would like medication adjustments. She started back on Effexor herself but told her PCP that she did it. She had been on it for years before it was stopped. She is currently on FMLA to complete the PHP program. On scale 1-10 as 10 being worst she rates depression at 1 and anxiety at 2. Denies SI/HI or AV hallucinations. PHQ9=18.

## 2020-11-30 ENCOUNTER — Other Ambulatory Visit (HOSPITAL_COMMUNITY): Payer: 59 | Admitting: Licensed Clinical Social Worker

## 2020-11-30 ENCOUNTER — Other Ambulatory Visit: Payer: Self-pay

## 2020-11-30 ENCOUNTER — Other Ambulatory Visit (INDEPENDENT_AMBULATORY_CARE_PROVIDER_SITE_OTHER): Payer: 59

## 2020-11-30 ENCOUNTER — Other Ambulatory Visit (HOSPITAL_COMMUNITY): Payer: 59 | Admitting: Occupational Therapy

## 2020-11-30 ENCOUNTER — Encounter (HOSPITAL_COMMUNITY): Payer: Self-pay

## 2020-11-30 DIAGNOSIS — R41844 Frontal lobe and executive function deficit: Secondary | ICD-10-CM

## 2020-11-30 DIAGNOSIS — Z794 Long term (current) use of insulin: Secondary | ICD-10-CM | POA: Diagnosis not present

## 2020-11-30 DIAGNOSIS — R7989 Other specified abnormal findings of blood chemistry: Secondary | ICD-10-CM | POA: Diagnosis not present

## 2020-11-30 DIAGNOSIS — F410 Panic disorder [episodic paroxysmal anxiety] without agoraphobia: Secondary | ICD-10-CM

## 2020-11-30 DIAGNOSIS — E1165 Type 2 diabetes mellitus with hyperglycemia: Secondary | ICD-10-CM

## 2020-11-30 DIAGNOSIS — R4589 Other symptoms and signs involving emotional state: Secondary | ICD-10-CM

## 2020-11-30 DIAGNOSIS — F411 Generalized anxiety disorder: Secondary | ICD-10-CM | POA: Diagnosis not present

## 2020-11-30 LAB — BASIC METABOLIC PANEL
BUN: 21 mg/dL (ref 6–23)
CO2: 30 mEq/L (ref 19–32)
Calcium: 9.4 mg/dL (ref 8.4–10.5)
Chloride: 104 mEq/L (ref 96–112)
Creatinine, Ser: 0.92 mg/dL (ref 0.40–1.20)
GFR: 66.58 mL/min (ref >60.00)
Glucose, Bld: 219 mg/dL — ABNORMAL HIGH (ref 70–99)
Potassium: 4.3 mEq/L (ref 3.5–5.1)
Sodium: 141 mEq/L (ref 135–145)

## 2020-11-30 LAB — HEMOGLOBIN A1C: Hgb A1c MFr Bld: 9.3 % — ABNORMAL HIGH (ref 4.6–6.5)

## 2020-11-30 LAB — TSH: TSH: 3.57 u[IU]/mL (ref 0.35–5.50)

## 2020-11-30 LAB — T4, FREE: Free T4: 0.62 ng/dL (ref 0.60–1.60)

## 2020-11-30 LAB — T3, FREE: T3, Free: 3.5 pg/mL (ref 2.3–4.2)

## 2020-11-30 MED ORDER — METOPROLOL SUCCINATE ER 25 MG PO TB24: 25.0000 mg | ORAL_TABLET | Freq: Every day | ORAL | 1 refills | Status: DC

## 2020-11-30 MED ORDER — ENTRESTO 24-26 MG PO TABS: 1.0000 | ORAL_TABLET | Freq: Two times a day (BID) | ORAL | 0 refills | Status: DC

## 2020-11-30 NOTE — Progress Notes (Signed)
Virtual Visit via Video Note  I connected with Destiny Beasley on 11/30/20 at 12:00 PM EDT by a video enabled telemedicine application and verified that I am speaking with the correct person using two identifiers.  Location: Patient: Home Provider: Home Office   I discussed the limitations of evaluation and management by telemedicine and the availability of in person appointments. The patient expressed understanding and agreed to proceed.     I discussed the assessment and treatment plan with the patient. The patient was provided an opportunity to ask questions and all were answered. The patient agreed with the plan and demonstrated an understanding of the instructions.   The patient was advised to call back or seek an in-person evaluation if the symptoms worsen or if the condition fails to improve as anticipated.  I provided 15 minutes of non-face-to-face time during this encounter.   Derrill Center, NP   Behavioral Health Partial Program Assessment Note  Date: 11/30/2020 Name: Destiny Beasley MRN: [id number]    [id number] Destiny Beasley is a 65 y.o. African American female presents with generalized anxiety disorder and panic attacks.  Reports a history of depression and anxiety.  States more recently panic attacks have been intense.  Reports ongoing racing thoughts and sense of impending doom.  States she has been prescribed Effexor which was helpful however she felt as if the medication had stopped working.  States she has been taking this medication since she resided in [place].states her husband passed 20 years prior.  No change in routine, did report her primary care provider started her on Zoloft medication. " This wasn't a good fit for me." Chart review patient has hypothyroid disorder.  States she continues to be followed by endocrinology for management.  Denied previous inpatient admissions.  Denied previous suicide attempts.  Reports overall "I had a good stress-free life" patient  denied illicit drug use or substance abuse history. Patient was enrolled in partial psychiatric program on 11/30/20.  Per assessment note: from the emergency dept: " is a 65yo female with a hx of depression, CAD, CHF, DM, HTN who presents with anxiety and depression.  She hasn't been sleeping well.  She has been having increased anxiety and crying spells.  She said "I want things to be over" but denies any specific thoughts of self harm.  She thinks she had a panic attack this am.  She recently was switched from effexor to zoloft by her PCP and doesn't think that it is working.  She denies any physical complaints or recent medical issues.  No cough or fevers.  No CP or SOB."   Primary complaints include: agitation and feeling depressed.  Onset of symptoms was gradual with gradually worsening course since that time. Psychosocial Stressors include the following: family.   I have reviewed the following documentation dated 11/30/2020: past psychiatric history, past medical history, and past social and family history  Complaints of Pain: nonear Past Psychiatric History:  First psychiatric contact , Drug/alcohol  and Therapy, Out Patient   Currently in treatment with  Effexor.   Substance Abuse History: none Use of Alcohol: denied Use of Caffeine: denies use Use of over the counter:   Past Surgical History:  Procedure Laterality Date   ABDOMINAL HYSTERECTOMY     BRAIN MENINGIOMA EXCISION  2005   BREAST BIOPSY     BREAST LUMPECTOMY Right    2017   BREAST LUMPECTOMY WITH RADIOACTIVE SEED LOCALIZATION Right 04/20/2016   Procedure: RIGHT BREAST LUMPECTOMY WITH RADIOACTIVE  SEED LOCALIZATION;  Surgeon: Fanny Skates, MD;  Location: [place];  Service: General;  Laterality: Right;   BREAST REDUCTION SURGERY Bilateral 10/23/2016   Procedure: BILATERAL BREAST REDUCTION WITH LIPOSUCTION ASSISTANCE;  Surgeon: Cristine Polio, MD;  Location: [place];  Service:  Plastics;  Laterality: Bilateral;   CESAREAN SECTION     CORONARY STENT INTERVENTION N/A 06/25/2017   Procedure: CORONARY STENT INTERVENTION;  Surgeon: Sherren Mocha, MD;  Location: [place] [facility];  Service: Cardiovascular;  Laterality: N/A;   CORONARY/GRAFT ACUTE MI REVASCULARIZATION N/A 06/25/2017   Procedure: Coronary/Graft Acute MI Revascularization;  Surgeon: Sherren Mocha, MD;  Location: [place] [facility];  Service: Cardiovascular;  Laterality: N/A;   FOOT SURGERY Bilateral 2016   hammer toe surgery   LEFT HEART CATH AND CORONARY ANGIOGRAPHY N/A 06/25/2017   Procedure: LEFT HEART CATH AND CORONARY ANGIOGRAPHY;  Surgeon: Sherren Mocha, MD;  Location: [place] [facility];  Service: Cardiovascular;  Laterality: N/A;   LEFT HEART CATH AND CORONARY ANGIOGRAPHY N/A 05/13/2020   Procedure: LEFT HEART CATH AND CORONARY ANGIOGRAPHY;  Surgeon: Lorretta Harp, MD;  Location: [place] [facility];  Service: Cardiovascular;  Laterality: N/A;   MENISCUS REPAIR Left 2015   PITUITARY SURGERY     REDUCTION MAMMAPLASTY     RIGHT HEART CATH N/A 06/25/2017   Procedure: RIGHT HEART CATH;  Surgeon: Sherren Mocha, MD;  Location: [place] [facility];  Service: Cardiovascular;  Laterality: N/A;   THYROID SURGERY      Past Medical History:  Diagnosis Date   Anemia    Anxiety    CAD in native artery    a. Inf STEMI 123XX123 complicated by cardiogenic shock/complete heart block s/p emergent cath showing culprit large dominant LCx s/p aspiration thrombectomy and DES, EF 50% by cath, 40-45% by echo.   Chronic systolic heart failure ([place]) 06/25/2017   Echo 11/19: mild LVH, EF 40-45, inf-lat HK, Gr 1 DD, trivial MR   Complete heart block, transient ([place])    a. 06/2017 in setting of acute inf MI -> resolved after PCI.   COVID-19    Diabetes mellitus    Goiter    History of radiation therapy 06/14/2016 - 07/19/2016   Right Breast 50 Gy 25 fractions   Hypercholesteremia    Hypertension    Ischemic  cardiomyopathy    a. EF 50% by cath and 40-45% by echo 06/2017.   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   Meningioma [facility])    Nuclear stress test    Nuclear stress test 10/19: EF 35, inferolateral, apical scar; inferior, apical inferior infarct with mild peri-infarct ischemia; high risk   Obesity    Personal history of chemotherapy    Personal history of radiation therapy    Pituitary tumor    Seasonal asthma    Sleep apnea    does not use every night   Stroke [facility]    TIA - Dec 1 st, 2017   Outpatient Encounter Medications as of 11/30/2020  Medication Sig   Alirocumab (PRALUENT) 75 MG/ML SOAJ Inject 1 mL into the skin every 14 (fourteen) days.   ALPRAZOLAM PO Take 12.5 mg by mouth 2 (two) times daily as needed (anxiety attack). (Patient not taking: No sig reported)   aspirin EC 81 MG tablet Take 1 tablet (81 mg total) by mouth daily.   Coenzyme Q10  100 MG capsule Take 100 mg by mouth daily.   Continuous Blood Gluc Receiver (FREESTYLE LIBRE 2 READER) DEVI Use reader to monitor blood sugars continuously.   Continuous Blood Gluc Sensor (FREESTYLE LIBRE 2 SENSOR) MISC USE 1 SENSOR ONCE EVERY 14  DAYS TO MONITOR BLOOD  SUGARS CONTINUOUSLY   furosemide (LASIX) 20 MG tablet Take 1 tablet (20 mg total) by mouth daily. For 3 days then take as need or fluid and swelling   glucose blood (FREESTYLE TEST STRIPS) test strip Use as instructed   HUMALOG 100 UNIT/ML cartridge INECT 3-25 UNITS INTO THE SKIN THREE TIMES DAILY WITH MEALS. USE PER SLIDING SCALE   hydrALAZINE (APRESOLINE) 10 MG tablet Take 2 tablets (20 mg total) by mouth 3 (three) times daily.   hydrOXYzine (ATARAX/VISTARIL) 25 MG tablet Take 1 tablet (25 mg total) by mouth 3 (three) times daily as needed for anxiety.   INPEN 100-BLUE-LILLY DEVI CONSULT MD FOR APP SETTINGS.   LANTUS SOLOSTAR 100 UNIT/ML Solostar Pen INJECT SUBCUTANEOUSLY 70  UNITS  DAILY   liraglutide (VICTOZA) 18 MG/3ML SOPN Inject 0.6 mg for 7 days, then increase to 1.2 mg daily (Patient not taking: No sig reported)   methimazole (TAPAZOLE) 10 MG tablet Take 10 mg by mouth 2 (two) times daily.   metoprolol succinate (TOPROL-XL) 25 MG 24 hr tablet Take 1 tablet (25 mg total) by mouth daily. Take with or immediately following a meal.   nitroGLYCERIN (NITROSTAT) 0.4 MG SL tablet DISSOLVE ONE TABLET UNDER TONGUE AS NEEDED FOR CHEST PAIN   Omega-3 Fatty Acids (FISH OIL OMEGA-3 PO) Take 1 tablet by mouth daily.   sertraline (ZOLOFT) 50 MG tablet Take 100 mg by mouth daily. (Patient not taking: No sig reported)   valACYclovir (VALTREX) 1000 MG tablet Take 1,000 mg by mouth 2 (two) times daily as needed (infection).   venlafaxine (EFFEXOR) 75 MG tablet Take 75 mg by mouth 2 (two) times daily.   No facility-administered encounter medications on file as of 11/30/2020.   Allergies  Allergen Reactions   Hydrocodone Other (See Comments)    Severe anxiety Confusion    Invokana [Canagliflozin] Other (See Comments)    Constant yeast infections   Amlodipine Other (See Comments)    Muscles hurt   Carvedilol Other (See Comments)    Wheezing with '25mg'$  BID dosing NOT ALLERGIC 10/08/20   Chlorthalidone Other (See Comments)    Leg pain   Evolocumab Other (See Comments)    Injection site pain   Hydralazine Other (See Comments)    Fatigue   Imdur [Isosorbide Nitrate] Other (See Comments)    Patient reported wheezing   Jardiance [Empagliflozin] Other (See Comments)    lightheadedness, weakness, low BP and yeast infection.  NOT ALLERGIC 10/08/20   Rosuvastatin Other (See Comments)    myalgias   Simvastatin Other (See Comments)    myalgias   Spironolactone Other (See Comments)    Wheezing vs fatigue?   Atorvastatin Other (See Comments)    DIZZINESS   Compazine [Prochlorperazine] Anxiety    Social History   Tobacco Use   Smoking status: Never   Smokeless tobacco: Never   Substance Use Topics   Alcohol use: Yes    Alcohol/week: 1.0 standard drink    Types: 1 Glasses of wine per week    Comment: 1 glass monthly   Functioning Relationships: good support system and good relationship with children Education: Other (Specify any learning disability, behavioral disorder, special education needs, difficulty reading.):  Other Pertinent  History: None Family History  Problem Relation Age of Onset   Diabetes Mother    Hyperlipidemia Mother    Hypertension Mother    Alcohol abuse Brother      Review of Systems Constitutional:   Objective:  There were no vitals filed for this visit.  Physical Exam:   Mental Status Exam: Appearance:  Casually dressed Psychomotor::  Within Normal Limits Attention span and concentration: Normal Behavior: calm, cooperative, and adequate rapport can be established Speech:  normal volume and pressured Mood:  depressed Affect:  normal Thought Process:  Coherent Thought Content:  Logical Orientation:  person, place, and situation Cognition:  grossly intact Insight:  Intact Judgment:  Intact Estimate of Intelligence: Average Fund of knowledge: Aware of current events Memory: Recent and remote intact Abnormal movements: None Gait and station: Normal  Assessment:  Diagnosis: Generalized anxiety disorder with panic attacks [F41.1, F41.0] 1. Generalized anxiety disorder with panic attacks   2. Difficulty coping     Indications for admission: inpatient care required if not in partial hospital program  Plan: Orders placed for Occupational Therapy  patient enrolled in Partial Hospitalization Program, patient's current medications are to be continued, a comprehensive treatment plan will be developed, and side effects of medications have been reviewed with patient  Treatment options and alternatives reviewed with patient and patient understands the above plan. Treatment plan was reviewed and a agreed upon by NP T.Bobby Rumpf  and patient Destiny Beasley need for group services.     Derrill Center, NP

## 2020-11-30 NOTE — Therapy (Signed)
[facility] [place], [place], [address] Phone: [phone]   Fax:  [phone] Virtual Visit via Video Note  I connected with [name] on 11/30/20 at  11:00 AM EDT by a video enabled telemedicine application and verified that I am speaking with the correct person using two identifiers.  Location: Patient: Patient Home Provider: Clinic Office   I discussed the limitations of evaluation and management by telemedicine and the availability of in person appointments. The patient expressed understanding and agreed to proceed.    I discussed the assessment and treatment plan with the patient. The patient was provided an opportunity to ask questions and all were answered. The patient agreed with the plan and demonstrated an understanding of the instructions.   The patient was advised to call back or seek an in-person evaluation if the symptoms worsen or if the condition fails to improve as anticipated.  I provided 60 minutes of non-face-to-face time during this encounter.   Ponciano Ort, OT   Occupational Therapy Treatment  Patient Details  Name: Destiny Beasley MRN: [id number] Date of Birth: Apr 27, 1955 Referring Provider (OT): Ricky Ala   Encounter Date: 11/30/2020   OT End of Session - 11/30/20 1526     Visit Number 2    Number of Visits 20    Date for OT Re-Evaluation 12/27/20    OT Start Time 1110    OT Stop Time 1210    OT Time Calculation (min) 60 min    Activity Tolerance Patient tolerated treatment well    Behavior During Therapy [facility] for tasks assessed/performed             Past Medical History:  Diagnosis Date   Anemia    Anxiety    CAD in native artery    a. Inf STEMI 123XX123 complicated by cardiogenic shock/complete heart block s/p emergent cath showing culprit large dominant LCx s/p aspiration thrombectomy and DES, EF 50% by cath, 40-45% by echo.   Chronic systolic heart  failure ([place]) 06/25/2017   Echo 11/19: mild LVH, EF 40-45, inf-lat HK, Gr 1 DD, trivial MR   Complete heart block, transient ([place])    a. 06/2017 in setting of acute inf MI -> resolved after PCI.   COVID-19    Diabetes mellitus    Goiter    History of radiation therapy 06/14/2016 - 07/19/2016   Right Breast 50 Gy 25 fractions   Hypercholesteremia    Hypertension    Ischemic cardiomyopathy    a. EF 50% by cath and 40-45% by echo 06/2017.   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   Meningioma [facility])    Nuclear stress test    Nuclear stress test 10/19: EF 35, inferolateral, apical scar; inferior, apical inferior infarct with mild peri-infarct ischemia; high risk   Obesity    Personal history of chemotherapy    Personal history of radiation therapy    Pituitary tumor    Seasonal asthma    Sleep apnea    does not use every night   Stroke [facility])    TIA - Dec 1 st, 2017    Past Surgical History:  Procedure Laterality Date   ABDOMINAL HYSTERECTOMY     BRAIN MENINGIOMA EXCISION  2005   BREAST BIOPSY     BREAST LUMPECTOMY Right    2017   BREAST  LUMPECTOMY WITH RADIOACTIVE SEED LOCALIZATION Right 04/20/2016   Procedure: RIGHT BREAST LUMPECTOMY WITH RADIOACTIVE SEED LOCALIZATION;  Surgeon: Fanny Skates, MD;  Location: [place];  Service: General;  Laterality: Right;   BREAST REDUCTION SURGERY Bilateral 10/23/2016   Procedure: BILATERAL BREAST REDUCTION WITH LIPOSUCTION ASSISTANCE;  Surgeon: Cristine Polio, MD;  Location: [place];  Service: Plastics;  Laterality: Bilateral;   CESAREAN SECTION     CORONARY STENT INTERVENTION N/A 06/25/2017   Procedure: CORONARY STENT INTERVENTION;  Surgeon: Sherren Mocha, MD;  Location: [place] [facility];  Service: Cardiovascular;  Laterality: N/A;   CORONARY/GRAFT ACUTE MI REVASCULARIZATION N/A 06/25/2017   Procedure:  Coronary/Graft Acute MI Revascularization;  Surgeon: Sherren Mocha, MD;  Location: [place] [facility];  Service: Cardiovascular;  Laterality: N/A;   FOOT SURGERY Bilateral 2016   hammer toe surgery   LEFT HEART CATH AND CORONARY ANGIOGRAPHY N/A 06/25/2017   Procedure: LEFT HEART CATH AND CORONARY ANGIOGRAPHY;  Surgeon: Sherren Mocha, MD;  Location: [place] [facility];  Service: Cardiovascular;  Laterality: N/A;   LEFT HEART CATH AND CORONARY ANGIOGRAPHY N/A 05/13/2020   Procedure: LEFT HEART CATH AND CORONARY ANGIOGRAPHY;  Surgeon: Lorretta Harp, MD;  Location: [place] [facility];  Service: Cardiovascular;  Laterality: N/A;   MENISCUS REPAIR Left 2015   PITUITARY SURGERY     REDUCTION MAMMAPLASTY     RIGHT HEART CATH N/A 06/25/2017   Procedure: RIGHT HEART CATH;  Surgeon: Sherren Mocha, MD;  Location: [place] [facility];  Service: Cardiovascular;  Laterality: N/A;   THYROID SURGERY      There were no vitals filed for this visit.   Subjective Assessment - 11/30/20 1526     Currently in Pain? No/denies                                  OT Education - 11/30/20 1526     Education Details Educated on physical symptomology of stress and its effects on the body, along with positive stress management tips/strategies    Person(s) Educated Patient    Methods Explanation;Handout    Comprehension Verbalized understanding              OT Short Term Goals - 11/30/20 1527       OT SHORT TERM GOAL #1   Title Pt will actively engage in OT group sessions throughout duration of PHP programming, in order to promote daily structure, social engagement, and opportunities to develop and utilize adaptive strategies to maximize functional performance in preparation for safe transition and integration back into school, work, and the community.    Status On-going      OT SHORT TERM GOAL #2   Title Pt will identify and implement 1-3 sleep hygiene strategies she can utilize,  in order to improve sleep quality/ADL performance, in preparation for safe and healthy reintegration back into the community at discharge.    Status On-going      OT SHORT TERM GOAL #3   Title Pt will identify 1-3 stress management strategies she can utilize, in order to safely manage increased psychosocial stressors identified, with min cues, in preparation for safe transition back to the community at discharge.    Status On-going           Group Session:  S: "I close my door and that's the sign not to bother Ms Leafie."  O: Group began with a  check-in and review of previous OT session focused on self-care. Today's discussion focused on the topic of stress management. Group members worked collaboratively to create a Environmental consultant identifying physical signs, behavioral signs, emotional/psychological, and cognitive signs of stress. Discussion then focused and encouraged group members to identify positive stress management strategies they could utilize in those moments to manage identified signs.   A: Baeley was active and independent in her participation of discussion and activity, sharing that she struggles with stress at work and one of the strategies she uses is to close the door of her office. Pt was an active participant in group brainstorm/discussion and shared strategies, both positive and negative that she has used in the past to manage her stressors.  P: Continue to attend PHP OT group sessions 5x week for 4 weeks to promote daily structure, social engagement, and opportunities to develop and utilize adaptive strategies to maximize functional performance in preparation for safe transition and integration back into school, work, and the community. Plan to address topic of communication in next OT group session.  Plan - 11/30/20 1527     Occupational performance deficits (Please refer to evaluation for details): ADL's;IADL's;Rest and Sleep;Education;Work;Leisure;Social  Participation    Body Structure / Function / Physical Skills ADL    Cognitive Skills Attention;Emotional;Energy/Drive;Learn;Memory;Perception;Problem Solve;Safety Awareness;Temperament/Personality;Thought;Understand    Psychosocial Skills Coping Strategies;Environmental  Adaptations;Habits;Interpersonal Interaction;Routines and Behaviors             Patient will benefit from skilled therapeutic intervention in order to improve the following deficits and impairments:   Body Structure / Function / Physical Skills: ADL Cognitive Skills: Attention, Emotional, Energy/Drive, Learn, Memory, Perception, Problem Solve, Safety Awareness, Temperament/Personality, Thought, Understand Psychosocial Skills: Coping Strategies, Environmental  Adaptations, Habits, Interpersonal Interaction, Routines and Behaviors   Visit Diagnosis: Difficulty coping  Frontal lobe and executive function deficit  Generalized anxiety disorder with panic attacks    Problem List Patient Active Problem List   Diagnosis Date Noted   Unstable angina ([place]) 05/12/2020   Chest pain with moderate risk for cardiac etiology 06/26/2019   History of TIA (transient ischemic attack) 08/08/2017   Malignant neoplasm of breast, stage 4 ([place]) 08/08/2017   History of complete heart block in setting of STEMI 06/2017 06/29/2017   OSA on CPAP 06/29/2017   CAD (coronary artery disease) 06/29/2017   Hx of Infero-Posterior ST Elevation MI in 06/2017 06/25/2017   History of cardiogenic shock in setting of STEMI in 06/2017 0000000   Chronic systolic heart failure ([place]) 06/25/2017   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

## 2020-11-30 NOTE — Addendum Note (Signed)
Addended by: Marcelle Overlie D on: 11/30/2020 04:50 PM   Modules accepted: Orders

## 2020-12-01 ENCOUNTER — Other Ambulatory Visit (HOSPITAL_COMMUNITY): Payer: 59 | Admitting: Licensed Clinical Social Worker

## 2020-12-01 ENCOUNTER — Encounter (HOSPITAL_COMMUNITY): Payer: Self-pay

## 2020-12-01 ENCOUNTER — Other Ambulatory Visit (HOSPITAL_COMMUNITY): Payer: 59 | Admitting: Occupational Therapy

## 2020-12-01 DIAGNOSIS — F411 Generalized anxiety disorder: Secondary | ICD-10-CM | POA: Diagnosis not present

## 2020-12-01 DIAGNOSIS — F410 Panic disorder [episodic paroxysmal anxiety] without agoraphobia: Secondary | ICD-10-CM

## 2020-12-01 DIAGNOSIS — R4589 Other symptoms and signs involving emotional state: Secondary | ICD-10-CM

## 2020-12-01 DIAGNOSIS — R41844 Frontal lobe and executive function deficit: Secondary | ICD-10-CM

## 2020-12-01 NOTE — Progress Notes (Signed)
Pt attended spiritual care group 11:00-12:00. Group met via web-ex due to COVID-19 precautions. Group facilitated by Kathrynn Humble, [place]  Group focused on topic of "community." Members reflected on topic in facilitated dialog, identifying responses to topic and notions they hold of community from their previous experience. Group members utilized value sort cards to identify top qualities they look for in community. Engaged in facilitated dialog around their value choices, noting origin of these values, how these are realized in their lives, and strategies for engaging these values.  Spiritual care group drew on Motivational Interviewing, Narrative and Adlerian modalities  Patient Progress:  Destiny Beasley participated and engaged in conversation.  She was able to identify her church community as a strength and identified some ways that she can ask for support.  Chaplain Janne Napoleon, [place] Pager, [phone] 5:05 PM

## 2020-12-01 NOTE — Therapy (Signed)
Destiny Beasley, [place], [address] Phone: [phone]   Fax:  [phone] Virtual Visit via Video Note  I connected with Destiny Beasley on 12/01/20 at  12:00 PM EDT by a video enabled telemedicine application and verified that I am speaking with the correct person using two identifiers.  Location: Patient: Patient Home Provider: Clinic Office   I discussed the limitations of evaluation and management by telemedicine and the availability of in person appointments. The patient expressed understanding and agreed to proceed.  I discussed the assessment and treatment plan with the patient. The patient was provided an opportunity to ask questions and all were answered. The patient agreed with the plan and demonstrated an understanding of the instructions.   The patient was advised to call back or seek an in-person evaluation if the symptoms worsen or if the condition fails to improve as anticipated.  I provided 50 minutes of non-face-to-face time during this encounter.   Ponciano Ort, OT   Occupational Therapy Treatment  Patient Details  Name: Destiny Beasley MRN: [id number] Date of Birth: 19-Sep-1955 Referring Provider (OT): Ricky Ala   Encounter Date: 12/01/2020   OT End of Session - 12/01/20 1459     Visit Number 3    Number of Visits 20    Date for OT Re-Evaluation 12/27/20    OT Start Time 1200    OT Stop Time 1250    OT Time Calculation (min) 50 min    Activity Tolerance Patient tolerated treatment well    Behavior During Therapy WFL for tasks assessed/performed             Past Medical History:  Diagnosis Date   Anemia    Anxiety    CAD in native artery    a. Inf STEMI 123XX123 complicated by cardiogenic shock/complete heart block s/p emergent cath showing culprit large dominant LCx s/p aspiration thrombectomy and DES, EF 50% by cath, 40-45% by echo.   Chronic systolic heart failure  ([place]) 06/25/2017   Echo 11/19: mild LVH, EF 40-45, inf-lat HK, Gr 1 DD, trivial MR   Complete heart block, transient ([place])    a. 06/2017 in setting of acute inf MI -> resolved after PCI.   COVID-19    Diabetes mellitus    Goiter    History of radiation therapy 06/14/2016 - 07/19/2016   Right Breast 50 Gy 25 fractions   Hypercholesteremia    Hypertension    Ischemic cardiomyopathy    a. EF 50% by cath and 40-45% by echo 06/2017.   Leg pain    ABIs 07/2019: Normal (R 1.27; L 1.25)   Malignant neoplasm of upper-outer quadrant of right female breast ([place]) 02/24/2016   Meningioma [facility])    Nuclear stress test    Nuclear stress test 10/19: EF 35, inferolateral, apical scar; inferior, apical inferior infarct with mild peri-infarct ischemia; high risk   Obesity    Personal history of chemotherapy    Personal history of radiation therapy    Pituitary tumor    Seasonal asthma    Sleep apnea    does not use every night   Stroke [facility])    TIA - Dec 1 st, 2017    Past Surgical History:  Procedure Laterality Date   ABDOMINAL HYSTERECTOMY     BRAIN MENINGIOMA EXCISION  2005   BREAST BIOPSY     BREAST LUMPECTOMY Right    2017   BREAST LUMPECTOMY WITH  RADIOACTIVE SEED LOCALIZATION Right 04/20/2016   Procedure: RIGHT BREAST LUMPECTOMY WITH RADIOACTIVE SEED LOCALIZATION;  Surgeon: Fanny Skates, MD;  Location: [place];  Service: General;  Laterality: Right;   BREAST REDUCTION SURGERY Bilateral 10/23/2016   Procedure: BILATERAL BREAST REDUCTION WITH LIPOSUCTION ASSISTANCE;  Surgeon: Cristine Polio, MD;  Location: [place];  Service: Plastics;  Laterality: Bilateral;   CESAREAN SECTION     CORONARY STENT INTERVENTION N/A 06/25/2017   Procedure: CORONARY STENT INTERVENTION;  Surgeon: Sherren Mocha, MD;  Location: [place] CV LAB;  Service: Cardiovascular;  Laterality: N/A;   CORONARY/GRAFT ACUTE MI REVASCULARIZATION N/A 06/25/2017   Procedure: Coronary/Graft Acute  MI Revascularization;  Surgeon: Sherren Mocha, MD;  Location: [place] CV LAB;  Service: Cardiovascular;  Laterality: N/A;   FOOT SURGERY Bilateral 2016   hammer toe surgery   LEFT HEART CATH AND CORONARY ANGIOGRAPHY N/A 06/25/2017   Procedure: LEFT HEART CATH AND CORONARY ANGIOGRAPHY;  Surgeon: Sherren Mocha, MD;  Location: [place] CV LAB;  Service: Cardiovascular;  Laterality: N/A;   LEFT HEART CATH AND CORONARY ANGIOGRAPHY N/A 05/13/2020   Procedure: LEFT HEART CATH AND CORONARY ANGIOGRAPHY;  Surgeon: Lorretta Harp, MD;  Location: [place] CV LAB;  Service: Cardiovascular;  Laterality: N/A;   MENISCUS REPAIR Left 2015   PITUITARY SURGERY     REDUCTION MAMMAPLASTY     RIGHT HEART CATH N/A 06/25/2017   Procedure: RIGHT HEART CATH;  Surgeon: Sherren Mocha, MD;  Location: [place] CV LAB;  Service: Cardiovascular;  Laterality: N/A;   THYROID SURGERY      There were no vitals filed for this visit.   Subjective Assessment - 12/01/20 1459     Currently in Pain? No/denies                                  OT Education - 12/01/20 1459     Education Details Educated on sleep hygiene and strategies to improve overall sleep quality    Person(s) Educated Patient    Methods Explanation;Handout    Comprehension Verbalized understanding              OT Short Term Goals - 11/30/20 1527       OT SHORT TERM GOAL #1   Title Pt will actively engage in OT group sessions throughout duration of PHP programming, in order to promote daily structure, social engagement, and opportunities to develop and utilize adaptive strategies to maximize functional performance in preparation for safe transition and integration back into school, work, and the community.    Status On-going      OT SHORT TERM GOAL #2   Title Pt will identify and implement 1-3 sleep hygiene strategies she can utilize, in order to improve sleep quality/ADL performance, in preparation for  safe and healthy reintegration back into the community at discharge.    Status On-going      OT SHORT TERM GOAL #3   Title Pt will identify 1-3 stress management strategies she can utilize, in order to safely manage increased psychosocial stressors identified, with min cues, in preparation for safe transition back to the community at discharge.    Status On-going           Group Session:  S: "I have trouble getting restful sleep"  O: Today's group discussion focused on topic of Sleep Hygiene. Patients reflected on the quality of sleep they typically receive and  identified areas that need improvement. Group was given background information on sleep and sleep hygiene, including common sleep disorders. Group members also received information on how to improve one's sleep and introduced a sleep diary as a tool that can be utilized to track sleep quality over a length of time. Group session ended with patients identifying one or more strategies they could utilize or implement into their sleep routine in order to improve overall sleep quality.    A: Destiny Beasley was active and independent in her participation of discussion, sharing that she struggles with getting restful sleep and is often up in the middle of the night and unable to fall back asleep. Pt actively asked relevant and appropriate questions through and overall appeared receptive to education and additional strategies to improve overall sleep hygiene.   P: Continue to attend PHP OT group sessions 5x week for 3 weeks to promote daily structure, social engagement, and opportunities to develop and utilize adaptive strategies to maximize functional performance in preparation for safe transition and integration back into school, work, and the community. Plan to address topic of communication in next OT group session.   Plan - 12/01/20 1459     Occupational performance deficits (Please refer to evaluation for details): ADL's;IADL's;Rest and  Sleep;Education;Work;Leisure;Social Participation    Body Structure / Function / Physical Skills ADL    Cognitive Skills Attention;Emotional;Energy/Drive;Learn;Memory;Perception;Problem Solve;Safety Awareness;Temperament/Personality;Thought;Understand    Psychosocial Skills Coping Strategies;Environmental  Adaptations;Habits;Interpersonal Interaction;Routines and Behaviors             Patient will benefit from skilled therapeutic intervention in order to improve the following deficits and impairments:   Body Structure / Function / Physical Skills: ADL Cognitive Skills: Attention, Emotional, Energy/Drive, Learn, Memory, Perception, Problem Solve, Safety Awareness, Temperament/Personality, Thought, Understand Psychosocial Skills: Coping Strategies, Environmental  Adaptations, Habits, Interpersonal Interaction, Routines and Behaviors   Visit Diagnosis: Difficulty coping  Frontal lobe and executive function deficit  Generalized anxiety disorder with panic attacks    Problem List Patient Active Problem List   Diagnosis Date Noted   Unstable angina ([place]) 05/12/2020   Chest pain with moderate risk for cardiac etiology 06/26/2019   History of TIA (transient ischemic attack) 08/08/2017   Malignant neoplasm of breast, stage 4 ([place]) 08/08/2017   History of complete heart block in setting of STEMI 06/2017 06/29/2017   OSA on CPAP 06/29/2017   CAD (coronary artery disease) 06/29/2017   Hx of Infero-Posterior ST Elevation MI in 06/2017 06/25/2017   History of cardiogenic shock in setting of STEMI in 06/2017 0000000   Chronic systolic heart failure ([place]) 06/25/2017   Malignant neoplasm of upper-outer quadrant of right female breast ([place]) 02/24/2016   Type II diabetes mellitus, uncontrolled ([place]) 02/11/2014   Essential hypertension 09/07/2013   Atypical chest pain 11/11/2011   Anxiety 11/11/2011   Insulin dependent diabetes mellitus 11/11/2011   Hyperlipidemia LDL goal <70 11/11/2011     12/01/2020  Ponciano Ort, MOT, OTR/L  12/01/2020, 3:00 PM  [place] [address] [place], [place], [address] Phone: [phone]   Fax:  [phone]  Name: Destiny Beasley MRN: [id number] Date of Birth: 1955-07-25

## 2020-12-02 ENCOUNTER — Other Ambulatory Visit (HOSPITAL_COMMUNITY): Payer: 59 | Admitting: Licensed Clinical Social Worker

## 2020-12-02 ENCOUNTER — Other Ambulatory Visit: Payer: Self-pay

## 2020-12-02 ENCOUNTER — Encounter (HOSPITAL_COMMUNITY): Payer: Self-pay | Admitting: Family

## 2020-12-02 ENCOUNTER — Other Ambulatory Visit (HOSPITAL_COMMUNITY): Payer: 59 | Admitting: Occupational Therapy

## 2020-12-02 ENCOUNTER — Encounter (HOSPITAL_COMMUNITY): Payer: Self-pay

## 2020-12-02 DIAGNOSIS — F410 Panic disorder [episodic paroxysmal anxiety] without agoraphobia: Secondary | ICD-10-CM

## 2020-12-02 DIAGNOSIS — F411 Generalized anxiety disorder: Secondary | ICD-10-CM | POA: Diagnosis not present

## 2020-12-02 DIAGNOSIS — R41844 Frontal lobe and executive function deficit: Secondary | ICD-10-CM

## 2020-12-02 DIAGNOSIS — R4589 Other symptoms and signs involving emotional state: Secondary | ICD-10-CM

## 2020-12-02 NOTE — Therapy (Signed)
[address] [place], [place], [address] Phone: [phone]   Fax:  [phone] Virtual Visit via Video Note  I connected with Destiny Beasley on 12/02/20 at  12:00 PM EDT by a video enabled telemedicine application and verified that I am speaking with the correct person using two identifiers.  Location: Patient: Patient Home Provider: Clinic Office    I discussed the limitations of evaluation and management by telemedicine and the availability of in person appointments. The patient expressed understanding and agreed to proceed.   I discussed the assessment and treatment plan with the patient. The patient was provided an opportunity to ask questions and all were answered. The patient agreed with the plan and demonstrated an understanding of the instructions.   The patient was advised to call back or seek an in-person evaluation if the symptoms worsen or if the condition fails to improve as anticipated.  I provided 50 minutes of non-face-to-face time during this encounter.   Ponciano Ort, OT   Occupational Therapy Treatment  Patient Details  Name: Destiny Beasley MRN: [id number] Date of Birth: [birth year] Referring Provider (OT): Ricky Ala   Encounter Date: 12/02/2020   OT End of Session - 12/02/20 1351     Visit Number 4    Number of Visits 20    Date for OT Re-Evaluation 12/27/20    OT Start Time 1200    OT Stop Time 1250    OT Time Calculation (min) 50 min    Activity Tolerance Patient tolerated treatment well    Behavior During Therapy WFL for tasks assessed/performed             Past Medical History:  Diagnosis Date   Anemia    Anxiety    CAD in native artery    a. Inf STEMI 123XX123 complicated by cardiogenic shock/complete heart block s/p emergent cath showing culprit large dominant LCx s/p aspiration thrombectomy and DES, EF 50% by cath, 40-45% by echo.   Chronic systolic heart  failure ([place]) 06/25/2017   Echo 11/19: mild LVH, EF 40-45, inf-lat HK, Gr 1 DD, trivial MR   Complete heart block, transient ([place])    a. 06/2017 in setting of acute inf MI -> resolved after PCI.   COVID-19    Diabetes mellitus    Goiter    History of radiation therapy 06/14/2016 - 07/19/2016   Right Breast 50 Gy 25 fractions   Hypercholesteremia    Hypertension    Ischemic cardiomyopathy    a. EF 50% by cath and 40-45% by echo 06/2017.   Leg pain    ABIs 07/2019: Normal (R 1.27; L 1.25)   Malignant neoplasm of upper-outer quadrant of right female breast ([place]) 02/24/2016   Meningioma [facility])    Nuclear stress test    Nuclear stress test 10/19: EF 35, inferolateral, apical scar; inferior, apical inferior infarct with mild peri-infarct ischemia; high risk   Obesity    Personal history of chemotherapy    Personal history of radiation therapy    Pituitary tumor    Seasonal asthma    Sleep apnea    does not use every night   Stroke [facility])    TIA - Dec 1 st, 2017    Past Surgical History:  Procedure Laterality Date   ABDOMINAL HYSTERECTOMY     BRAIN MENINGIOMA EXCISION  2005   BREAST BIOPSY     BREAST LUMPECTOMY Right    2017   BREAST  LUMPECTOMY WITH RADIOACTIVE SEED LOCALIZATION Right 04/20/2016   Procedure: RIGHT BREAST LUMPECTOMY WITH RADIOACTIVE SEED LOCALIZATION;  Surgeon: Fanny Skates, MD;  Location: [place];  Service: General;  Laterality: Right;   BREAST REDUCTION SURGERY Bilateral 10/23/2016   Procedure: BILATERAL BREAST REDUCTION WITH LIPOSUCTION ASSISTANCE;  Surgeon: Cristine Polio, MD;  Location: [place];  Service: Plastics;  Laterality: Bilateral;   CESAREAN SECTION     CORONARY STENT INTERVENTION N/A 06/25/2017   Procedure: CORONARY STENT INTERVENTION;  Surgeon: Sherren Mocha, MD;  Location: [place] CV LAB;  Service: Cardiovascular;  Laterality: N/A;   CORONARY/GRAFT ACUTE MI REVASCULARIZATION N/A 06/25/2017   Procedure:  Coronary/Graft Acute MI Revascularization;  Surgeon: Sherren Mocha, MD;  Location: [place] [facility] LAB;  Service: Cardiovascular;  Laterality: N/A;   FOOT SURGERY Bilateral 2016   hammer toe surgery   LEFT HEART CATH AND CORONARY ANGIOGRAPHY N/A 06/25/2017   Procedure: LEFT HEART CATH AND CORONARY ANGIOGRAPHY;  Surgeon: Sherren Mocha, MD;  Location: [place] CV LAB;  Service: Cardiovascular;  Laterality: N/A;   LEFT HEART CATH AND CORONARY ANGIOGRAPHY N/A 05/13/2020   Procedure: LEFT HEART CATH AND CORONARY ANGIOGRAPHY;  Surgeon: Lorretta Harp, MD;  Location: [place] CV LAB;  Service: Cardiovascular;  Laterality: N/A;   MENISCUS REPAIR Left 2015   PITUITARY SURGERY     REDUCTION MAMMAPLASTY     RIGHT HEART CATH N/A 06/25/2017   Procedure: RIGHT HEART CATH;  Surgeon: Sherren Mocha, MD;  Location: [place] CV LAB;  Service: Cardiovascular;  Laterality: N/A;   THYROID SURGERY      There were no vitals filed for this visit.   Subjective Assessment - 12/02/20 1350     Currently in Pain? No/denies             OT Education - 12/02/20 1350     Education Details Educated on different communication styles and identified strategies/tips to practice being more assertive    Person(s) Educated Patient    Methods Explanation;Handout    Comprehension Verbalized understanding              OT Short Term Goals - 11/30/20 1527       OT SHORT TERM GOAL #1   Title Pt will actively engage in OT group sessions throughout duration of PHP programming, in order to promote daily structure, social engagement, and opportunities to develop and utilize adaptive strategies to maximize functional performance in preparation for safe transition and integration back into school, work, and the community.    Status On-going      OT SHORT TERM GOAL #2   Title Pt will identify and implement 1-3 sleep hygiene strategies she can utilize, in order to improve sleep quality/ADL performance, in  preparation for safe and healthy reintegration back into the community at discharge.    Status On-going      OT SHORT TERM GOAL #3   Title Pt will identify 1-3 stress management strategies she can utilize, in order to safely manage increased psychosocial stressors identified, with min cues, in preparation for safe transition back to the community at discharge.    Status On-going           Group Session:  S: "I don't have any concerns with communication, I'm pretty good at it."  O: Today's group focused on topic of Communication Styles. Group members were educated on the different styles including passive, aggressive, and assertive communication. Members shared and reflected on which style they most  often find themselves communicating in and how to transition to a more assertive approach.  A: Lyndzi was active in her participation of discussion, however shared that she does not struggle with communication at this time. Pt was willing and overall appropriate in her participation and listening to the education/responses from peers.   P: Continue to attend PHP OT group sessions 5x week for 2 weeks to promote daily structure, social engagement, and opportunities to develop and utilize adaptive strategies to maximize functional performance in preparation for safe transition and integration back into school, work, and the community. Plan to address topic of assertiveness in next OT group session.  Plan - 12/02/20 1351     Occupational performance deficits (Please refer to evaluation for details): ADL's;IADL's;Rest and Sleep;Education;Work;Leisure;Social Participation    Body Structure / Function / Physical Skills ADL    Cognitive Skills Attention;Emotional;Energy/Drive;Learn;Memory;Perception;Problem Solve;Safety Awareness;Temperament/Personality;Thought;Understand    Psychosocial Skills Coping Strategies;Environmental  Adaptations;Habits;Interpersonal Interaction;Routines and Behaviors              Patient will benefit from skilled therapeutic intervention in order to improve the following deficits and impairments:   Body Structure / Function / Physical Skills: ADL Cognitive Skills: Attention, Emotional, Energy/Drive, Learn, Memory, Perception, Problem Solve, Safety Awareness, Temperament/Personality, Thought, Understand Psychosocial Skills: Coping Strategies, Environmental  Adaptations, Habits, Interpersonal Interaction, Routines and Behaviors   Visit Diagnosis: Difficulty coping  Frontal lobe and executive function deficit  Generalized anxiety disorder with panic attacks    Problem List Patient Active Problem List   Diagnosis Date Noted   Unstable angina ([place]) 05/12/2020   Chest pain with moderate risk for cardiac etiology 06/26/2019   History of TIA (transient ischemic attack) 08/08/2017   Malignant neoplasm of breast, stage 4 ([place]) 08/08/2017   History of complete heart block in setting of STEMI 06/2017 06/29/2017   OSA on CPAP 06/29/2017   CAD (coronary artery disease) 06/29/2017   Hx of Infero-Posterior ST Elevation MI in 06/2017 06/25/2017   History of cardiogenic shock in setting of STEMI in 06/2017 0000000   Chronic systolic heart failure ([place]) 06/25/2017   Malignant neoplasm of upper-outer quadrant of right female breast ([place]) 02/24/2016   Type II diabetes mellitus, uncontrolled ([place]) 02/11/2014   Essential hypertension 09/07/2013   Atypical chest pain 11/11/2011   Anxiety 11/11/2011   Insulin dependent diabetes mellitus 11/11/2011   Hyperlipidemia LDL goal <70 11/11/2011    12/02/2020  Ponciano Ort, MOT, OTR/L  12/02/2020, 1:51 PM  [place], [place], [address] Phone: [phone]   Fax:  [phone]  Name: Destiny Beasley MRN: [id number] Date of Birth: 1956/04/17

## 2020-12-03 ENCOUNTER — Encounter (HOSPITAL_COMMUNITY): Payer: Self-pay | Admitting: Family

## 2020-12-03 ENCOUNTER — Encounter: Payer: Self-pay | Admitting: Endocrinology

## 2020-12-03 ENCOUNTER — Ambulatory Visit (INDEPENDENT_AMBULATORY_CARE_PROVIDER_SITE_OTHER): Payer: 59 | Admitting: Endocrinology

## 2020-12-03 ENCOUNTER — Encounter (HOSPITAL_COMMUNITY): Payer: Self-pay

## 2020-12-03 ENCOUNTER — Other Ambulatory Visit: Payer: Self-pay

## 2020-12-03 ENCOUNTER — Other Ambulatory Visit (HOSPITAL_COMMUNITY): Payer: 59 | Admitting: Occupational Therapy

## 2020-12-03 ENCOUNTER — Other Ambulatory Visit (HOSPITAL_COMMUNITY): Payer: 59 | Admitting: Licensed Clinical Social Worker

## 2020-12-03 VITALS — BP 172/102 | HR 89 | Ht 72.0 in | Wt 268.0 lb

## 2020-12-03 DIAGNOSIS — R41844 Frontal lobe and executive function deficit: Secondary | ICD-10-CM

## 2020-12-03 DIAGNOSIS — Z8639 Personal history of other endocrine, nutritional and metabolic disease: Secondary | ICD-10-CM | POA: Diagnosis not present

## 2020-12-03 DIAGNOSIS — R7989 Other specified abnormal findings of blood chemistry: Secondary | ICD-10-CM

## 2020-12-03 DIAGNOSIS — E1165 Type 2 diabetes mellitus with hyperglycemia: Secondary | ICD-10-CM | POA: Diagnosis not present

## 2020-12-03 DIAGNOSIS — F411 Generalized anxiety disorder: Secondary | ICD-10-CM | POA: Diagnosis not present

## 2020-12-03 DIAGNOSIS — F410 Panic disorder [episodic paroxysmal anxiety] without agoraphobia: Secondary | ICD-10-CM

## 2020-12-03 DIAGNOSIS — I10 Essential (primary) hypertension: Secondary | ICD-10-CM | POA: Diagnosis not present

## 2020-12-03 DIAGNOSIS — R4589 Other symptoms and signs involving emotional state: Secondary | ICD-10-CM

## 2020-12-03 DIAGNOSIS — Z794 Long term (current) use of insulin: Secondary | ICD-10-CM | POA: Diagnosis not present

## 2020-12-03 MED ORDER — VENLAFAXINE HCL 75 MG PO TABS: 75.0000 mg | ORAL_TABLET | Freq: Two times a day (BID) | ORAL | 0 refills | Status: DC

## 2020-12-03 MED ORDER — OZEMPIC (0.25 OR 0.5 MG/DOSE) 2 MG/1.5ML ~~LOC~~ SOPN: 0.5000 mg | PEN_INJECTOR | SUBCUTANEOUS | 2 refills | Status: DC

## 2020-12-03 MED ORDER — OMNIPOD 5 DEXG7G6 INTRO GEN 5 KIT: 1.0000 | PACK | Freq: Once | 0 refills | Status: AC

## 2020-12-03 NOTE — Progress Notes (Signed)
Medication was refilled.

## 2020-12-03 NOTE — Patient Instructions (Addendum)
LANTUS 40 IN AND 50 IN PM  Start OZEMPIC injections by dialing 0.25 mg on the pen as shown once weekly on the same day of the week.   You may inject in the sides of the stomach, outer thigh or arm as indicated in the brochure given. If you have any difficulties using the pen see the video at [email or web address].[email or web address]  You will feel fullness of the stomach with starting the medication and should try to keep the portions at meals small.  You may experience nausea in the first few days which usually gets better over time    After 2 weeks increase the dose to 0.5 mg weekly  If you have any questions or persistent side effects please call the office     Stop  METHIMAZOLE  Sensitivity 15

## 2020-12-03 NOTE — Progress Notes (Signed)
Patient ID: Destiny Beasley, female   DOB: [birth year], 65 y.o.   MRN: [id number]   Reason for Appointment: Type II Diabetes follow-up   History of Present Illness    Date of diagnosis: 10/2008  Previous history: She had markedly increased blood sugars at diagnosis and was tried on oral hypoglycemic drugs and Victoza for about 9 months before starting an insulin.  Since 2011 she had been on basal bolus insulin regimen Previously had had difficulty controlling her diabetes mostly because of difficulty with compliance with various aspects of self-care.  Previous regimen: Toujeo 60 units at 8 am . Apidra 20 units at times  She was started on the Omnipod insulin pump on 09/11/16  Recent history:   Insulin regimen: HUMALOG U-100 with Inpen carbohydrate coverage 1: 6, sensitivity 1: 8 Lantus insulin 40-40 units pm daily  Non-insulin hypoglycemic drugs: None  Her A1c has been consistently high, now 9.3         Current management, blood sugar patterns and problems: She was asked to increase her Ozempic on her last visit but she called back to say it was causing her panic attacks and stopped this She also did not start Victoza as directed She was told to increase her Lantus to 50 units in the morning but she is still taking 40 Because of this her average blood sugars are running between at least 175 and up to 236 at any given time On her own she is trying to adjust her carbohydrate and correction factor ratio is and taking relatively more than before using her Inpen She has however started doing some walking at least a couple of times a week Still has not lost any weight Freestyle libre appears to be fairly accurate compared to the lab glucose She was told that she is approved for the OmniPod but she did not ask for a prescription Only occasionally with overcorrection her blood sugar may be low normal overnight   CGM interpretation is as follows:  Blood sugars are generally  highly variable at all different times  Again blood sugars on an average are above target range and only averaging about 170-180 early morning  She does have variable postprandial rise in blood sugar especially in the evenings or afternoons and sometimes after breakfast  However.  Meal readings are generally averaging 170-200, higher at dinnertime  Rarely blood sugar may be low normal around 1 AM, 7 AM or 9 PM  No hypoglycemia at any time  High his blood sugars on an average around 6-10 PM  Generally overnight blood sugars are averaging 225 at midnight with slight improvement by 6 AM    CGM data for the last 2 weeks from freestyle libre  CGM use % of time 83  2-week average/GV 210  Time in range 33       %  % Time Above 180 43  % Time above 250 24  % Time Below 70 0     PRE-MEAL Fasting Lunch Dinner Bedtime Overall  Glucose range:       Averages: 174 191 227 212 210   POST-MEAL PC Breakfast PC Lunch PC Dinner  Glucose range:     Averages: 225 206 236    Previously  CGM use % of time  91  2-week average/GV  213  Time in range    30    %  % Time Above 180  46+24  % Time above 250   % Time Below  70 0     PRE-MEAL Fasting Lunch Dinner Bedtime Overall  Glucose range:       Averages:  210  198  213  .  13   POST-MEAL PC Breakfast PC Lunch PC Dinner  Glucose range:     Averages:  224  207  234     Glycemic patterns summary: Overall blood sugars are highly variable and persistently high, on an average blood sugars are above 180 target throughout the day and night   Dietician visit: Most recent: 04/2010 Weight control:  Wt Readings from Last 3 Encounters:  12/03/20 268 lb (121.6 kg)  11/01/20 265 lb (120.2 kg)  10/08/20 270 lb 15.1 oz (122.9 kg)         Diabetes labs:  Lab Results  Component Value Date   HGBA1C 9.3 (H) 11/30/2020   HGBA1C 9.0 (H) 07/26/2020   HGBA1C 8.8 (H) 05/24/2020   Lab Results  Component Value Date   MICROALBUR <0.7 08/25/2020    LDLCALC 52 10/06/2020   CREATININE 0.92 11/30/2020   Other active medical problems including recent evaluation of thyroid: See review of systems   No visits with results within 1 Day(s) from this visit.  Latest known visit with results is:  Lab on 11/30/2020  Component Date Value Ref Range Status   T3, Free 11/30/2020 3.5  2.3 - 4.2 pg/mL Final   Free T4 11/30/2020 0.62  0.60 - 1.60 ng/dL Final   Comment: Specimens from patients who are undergoing biotin therapy and /or ingesting biotin supplements may contain high levels of biotin.  The higher biotin concentration in these specimens interferes with this Free T4 assay.  Specimens that contain high levels  of biotin may cause false high results for this Free T4 assay.  Please interpret results in light of the total clinical presentation of the patient.     TSH 11/30/2020 3.57  0.35 - 5.50 uIU/mL Final   Sodium 11/30/2020 141  135 - 145 mEq/L Final   Potassium 11/30/2020 4.3  3.5 - 5.1 mEq/L Final   Chloride 11/30/2020 104  96 - 112 mEq/L Final   CO2 11/30/2020 30  19 - 32 mEq/L Final   Glucose, Bld 11/30/2020 219 (A) 70 - 99 mg/dL Final   BUN 11/30/2020 21  6 - 23 mg/dL Final   Creatinine, Ser 11/30/2020 0.92  0.40 - 1.20 mg/dL Final   GFR 11/30/2020 66.58  >60.00 mL/min Final   Calculated using the CKD-EPI Creatinine Equation (2021)   Calcium 11/30/2020 9.4  8.4 - 10.5 mg/dL Final   Hgb A1c MFr Bld 11/30/2020 9.3 (A) 4.6 - 6.5 % Final   Glycemic Control Guidelines for People with Diabetes:Non Diabetic:  <6%Goal of Therapy: <7%Additional Action Suggested:  >8%      Allergies as of 12/03/2020       Reactions   Hydrocodone Other (See Comments)   Severe anxiety Confusion   Invokana [canagliflozin] Other (See Comments)   Constant yeast infections   Amlodipine Other (See Comments)   Muscles hurt   Carvedilol Other (See Comments)   Wheezing with $RemoveBefor'25mg'uDvKliflrGTi$  BID dosing NOT ALLERGIC 10/08/20   Chlorthalidone Other (See Comments)   Leg pain    Evolocumab Other (See Comments)   Injection site pain   Hydralazine Other (See Comments)   Fatigue   Imdur [isosorbide Nitrate] Other (See Comments)   Patient reported wheezing   Jardiance [empagliflozin] Other (See Comments)   lightheadedness, weakness, low BP and yeast infection.  NOT ALLERGIC  10/08/20   Rosuvastatin Other (See Comments)   myalgias   Simvastatin Other (See Comments)   myalgias   Spironolactone Other (See Comments)   Wheezing vs fatigue?   Atorvastatin Other (See Comments)   DIZZINESS   Compazine [prochlorperazine] Anxiety        Medication List        Accurate as of December 03, 2020  3:53 PM. If you have any questions, ask your nurse or doctor.          STOP taking these medications    Victoza 18 MG/3ML Sopn Generic drug: liraglutide Stopped by: Elayne Snare, MD       TAKE these medications    ALPRAZOLAM PO Take 12.5 mg by mouth 2 (two) times daily as needed (anxiety attack).   aspirin EC 81 MG tablet Take 1 tablet (81 mg total) by mouth daily.   Coenzyme Q10 100 MG capsule Take 100 mg by mouth daily.   Entresto 24-26 MG Generic drug: sacubitril-valsartan Take 1 tablet by mouth 2 (two) times daily.   FISH OIL OMEGA-3 PO Take 1 tablet by mouth daily.   FreeStyle Libre 2 Reader [place] Use reader to monitor blood sugars continuously.   FreeStyle Libre 2 Sensor Misc USE 1 SENSOR ONCE EVERY 14  DAYS TO MONITOR BLOOD  SUGARS CONTINUOUSLY   FREESTYLE TEST STRIPS test strip Generic drug: glucose blood Use as instructed   furosemide 20 MG tablet Commonly known as: LASIX Take 1 tablet (20 mg total) by mouth daily. For 3 days then take as need or fluid and swelling   HumaLOG 100 UNIT/ML cartridge Generic drug: insulin lispro INECT 3-25 UNITS INTO THE SKIN THREE TIMES DAILY WITH MEALS. USE PER SLIDING SCALE   hydrALAZINE 10 MG tablet Commonly known as: APRESOLINE Take 2 tablets (20 mg total) by mouth 3 (three) times daily.    hydrOXYzine 25 MG tablet Commonly known as: ATARAX/VISTARIL Take 1 tablet (25 mg total) by mouth 3 (three) times daily as needed for anxiety.   InPen 100-Blue-Lilly Devi Generic drug: injection device for insulin CONSULT MD FOR APP SETTINGS.   Lantus SoloStar 100 UNIT/ML Solostar Pen Generic drug: insulin glargine INJECT SUBCUTANEOUSLY 70  UNITS DAILY   methimazole 10 MG tablet Commonly known as: TAPAZOLE Take 10 mg by mouth 2 (two) times daily.   metoprolol succinate 25 MG 24 hr tablet Commonly known as: TOPROL-XL Take 1 tablet (25 mg total) by mouth daily. Take with or immediately following a meal.   nitroGLYCERIN 0.4 MG SL tablet Commonly known as: NITROSTAT DISSOLVE ONE TABLET UNDER TONGUE AS NEEDED FOR CHEST PAIN   Omnipod 5 G6 Intro (Gen 5) Kit 1 kit by Does not apply route once for 1 dose. Started by: Elayne Snare, MD   Ozempic (0.25 or 0.5 MG/DOSE) 2 MG/1.5ML Sopn Generic drug: Semaglutide(0.25 or 0.5MG /DOS) Inject 0.5 mg into the skin once a week. Started by: Elayne Snare, MD   Praluent 75 MG/ML [place] Generic drug: Alirocumab Inject 1 mL into the skin every 14 (fourteen) days.   sertraline 50 MG tablet Commonly known as: ZOLOFT Take 100 mg by mouth daily.   valACYclovir 1000 MG tablet Commonly known as: VALTREX Take 1,000 mg by mouth 2 (two) times daily as needed (infection).   venlafaxine 75 MG tablet Commonly known as: EFFEXOR Take 1 tablet (75 mg total) by mouth 2 (two) times daily.        Allergies:  Allergies  Allergen Reactions   Hydrocodone Other (See Comments)  Severe anxiety Confusion    Invokana [Canagliflozin] Other (See Comments)    Constant yeast infections   Amlodipine Other (See Comments)    Muscles hurt   Carvedilol Other (See Comments)    Wheezing with $RemoveBefor'25mg'[id number]$  BID dosing NOT ALLERGIC 10/08/20   Chlorthalidone Other (See Comments)    Leg pain   Evolocumab Other (See Comments)    Injection site pain   Hydralazine Other (See  Comments)    Fatigue   Imdur [Isosorbide Nitrate] Other (See Comments)    Patient reported wheezing   Jardiance [Empagliflozin] Other (See Comments)    lightheadedness, weakness, low BP and yeast infection.  NOT ALLERGIC 10/08/20   Rosuvastatin Other (See Comments)    myalgias   Simvastatin Other (See Comments)    myalgias   Spironolactone Other (See Comments)    Wheezing vs fatigue?   Atorvastatin Other (See Comments)    DIZZINESS   Compazine [Prochlorperazine] Anxiety    Past Medical History:  Diagnosis Date   Anemia    Anxiety    CAD in native artery    a. Inf STEMI 11/5275 complicated by cardiogenic shock/complete heart block s/p emergent cath showing culprit large dominant LCx s/p aspiration thrombectomy and DES, EF 50% by cath, 40-45% by echo.   Chronic systolic heart failure ([place]) 06/25/2017   Echo 11/19: mild LVH, EF 40-45, inf-lat HK, Gr 1 DD, trivial MR   Complete heart block, transient ([place])    a. 06/2017 in setting of acute inf MI -> resolved after PCI.   COVID-19    Diabetes mellitus    Goiter    History of radiation therapy 06/14/2016 - 07/19/2016   Right Breast 50 Gy 25 fractions   Hypercholesteremia    Hypertension    Ischemic cardiomyopathy    a. EF 50% by cath and 40-45% by echo 06/2017.   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   Meningioma [facility])    Nuclear stress test    Nuclear stress test 10/19: EF 35, inferolateral, apical scar; inferior, apical inferior infarct with mild peri-infarct ischemia; high risk   Obesity    Personal history of chemotherapy    Personal history of radiation therapy    Pituitary tumor    Seasonal asthma    Sleep apnea    does not use every night   Stroke [facility])    TIA - Dec 1 st, 2017    Past Surgical History:  Procedure Laterality Date   ABDOMINAL HYSTERECTOMY     BRAIN MENINGIOMA EXCISION  2005   BREAST BIOPSY     BREAST  LUMPECTOMY Right    2017   BREAST LUMPECTOMY WITH RADIOACTIVE SEED LOCALIZATION Right 04/20/2016   Procedure: RIGHT BREAST LUMPECTOMY WITH RADIOACTIVE SEED LOCALIZATION;  Surgeon: Fanny Skates, MD;  Location: [place];  Service: General;  Laterality: Right;   BREAST REDUCTION SURGERY Bilateral 10/23/2016   Procedure: BILATERAL BREAST REDUCTION WITH LIPOSUCTION ASSISTANCE;  Surgeon: Cristine Polio, MD;  Location: [place];  Service: Plastics;  Laterality: Bilateral;   CESAREAN SECTION     CORONARY STENT INTERVENTION N/A 06/25/2017   Procedure: CORONARY STENT INTERVENTION;  Surgeon: Sherren Mocha, MD;  Location: [place] CV LAB;  Service: Cardiovascular;  Laterality: N/A;   CORONARY/GRAFT ACUTE MI REVASCULARIZATION N/A 06/25/2017   Procedure: Coronary/Graft Acute MI Revascularization;  Surgeon: Sherren Mocha, MD;  Location: [place] CV  LAB;  Service: Cardiovascular;  Laterality: N/A;   FOOT SURGERY Bilateral 2016   hammer toe surgery   LEFT HEART CATH AND CORONARY ANGIOGRAPHY N/A 06/25/2017   Procedure: LEFT HEART CATH AND CORONARY ANGIOGRAPHY;  Surgeon: Sherren Mocha, MD;  Location: [place] CV LAB;  Service: Cardiovascular;  Laterality: N/A;   LEFT HEART CATH AND CORONARY ANGIOGRAPHY N/A 05/13/2020   Procedure: LEFT HEART CATH AND CORONARY ANGIOGRAPHY;  Surgeon: Lorretta Harp, MD;  Location: [place] CV LAB;  Service: Cardiovascular;  Laterality: N/A;   MENISCUS REPAIR Left 2015   PITUITARY SURGERY     REDUCTION MAMMAPLASTY     RIGHT HEART CATH N/A 06/25/2017   Procedure: RIGHT HEART CATH;  Surgeon: Sherren Mocha, MD;  Location: [place] CV LAB;  Service: Cardiovascular;  Laterality: N/A;   THYROID SURGERY      Family History  Problem Relation Age of Onset   Diabetes Mother    Hyperlipidemia Mother    Hypertension Mother    Alcohol abuse Brother     Social History:  reports that she has never smoked. She has never used smokeless  tobacco. She reports current alcohol use of about 1.0 standard drink per week. She reports that she does not use drugs.  Review of Systems:  THYROID: Because of her anxiety and panic attacks her PCP was evaluating her thyroid levels However only her free T3 level was higher than normal once but no other abnormalities including persistently normal TSH levels She says she has a history of thyroid surgery remotely Has not been known to have a recent goiter She has not had any weight loss, heat intolerance, sweating or any typical symptoms of hyperthyroidism except anxiety  Despite this her PCP has now started her on methimazole 10 mg daily over the last month or so She is also taking medication for anxiety and is feeling better  Lab Results  Component Value Date   TSH 3.57 11/30/2020   TSH 1.295 10/08/2020   TSH 1.270 07/26/2020   FREET4 0.62 11/30/2020   Free T3 level normal at 3.5  Hypertension: Recently appears to be out of control, blood pressure is managed by cardiologist with readings as follows:  She is taking Entresto and metoprolol 25 mg as also hydralazine, previously on Avapro and carvedilol  BP Readings from Last 3 Encounters:  12/03/20 (!) 172/102  11/08/20 (!) 160/101  11/01/20 (!) 160/80     Lipids: She has been on Praluent from cardiologist, could not tolerate Repatha  Followed by cardiology  She does have a history of MI  Lab Results  Component Value Date   CHOL 120 10/06/2020   HDL 54 10/06/2020   LDLCALC 52 10/06/2020   LDLDIRECT 67 07/15/2020   TRIG 63 10/06/2020   CHOLHDL 2.2 10/06/2020   Lab Results  Component Value Date   ALT 24 11/08/2020    Diabetic shoe prescription has been given previously  She reportedly has had surgery for pituitary tumor but last MRI only shows 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   Thyroid not palpable No tremor Biceps reflexes are normal Skin is not unusually warm on hands No peripheral edema  ASSESSMENT/ PLAN:    Diabetes type 2 with obesity, hypertension  and hyperlipidemia, BMI more than 35  See history of present illness for detailed discussion of current management, interpretation of her continuous glucose monitoring record, blood sugar patterns and problems identified  Her A1c has been consistently high and now 9.3  As above she has not increased her Lantus and her baseline Premeal blood sugars and overnight readings are fairly consistently high  She also has irregular coverage of her meals but in the last 2 or 3 days with her likely increasing her carbohydrate coverage she is getting better postprandial readings  However can do better with efforts to lose weight  She is agreeing to go back to [place] as she has not taken any GLP-1 drugs as prescribed  Reassured her that Ozempic does not cause anxiety or depression   Currently her sensitivity may be too much at 1: 8 as occasionally will have low normal readings after taking her Humalog Does need increasing basal insulin at least overnight As before she likely is going to get better control with an insulin pump with better insulin delivery as well as generally better insulin action with lower doses   Recommendations today:    She was reminded to take 50 units Lantus in the evening and 40 in the morning Carbohydrate coverage 1: 6 as before Correction factor I: 15  Accurate carbohydrate counting She will be sent a prescription for the OmniPod 5 pump and if not covered may try the 4 version Restart Ozempic using 0.25 mg weekly for 2 weeks and then 0.5 mg Regular exercise  THYROID: Reassured her that he does not have a thyroid problem or hyperthyroidism since her TSH has been consistently normal.  Likely she had a falsely high T3 level as an isolated test  Also did not does  not have a pituitary tumor, last MRI did not show this and has no clinical suggestions of hypopituitarism or acromegaly  She will stop methimazole and recheck labs on the next visit If she does have any evidence of hyperthyroidism in the future may consider thyroid scan to rule out hot nodule  LIPIDS: To be followed by cardiologist  Hypertension: Blood pressure is high  with recent blood pressure medication changes nd she will need to discuss management with the prescribing physicians   Patient Instructions  LANTUS 40 IN AND 50 IN PM  Start OZEMPIC injections by dialing 0.25 mg on the pen as shown once weekly on the same day of the week.   You may inject in the sides of the stomach, outer thigh or arm as indicated in the brochure given. If you have any difficulties using the pen see the video at CompPlans.co.za  You will feel fullness of the stomach with starting the medication and should try to keep the portions at meals small.  You may experience nausea in the first few days which usually gets better over time    After 2 weeks increase the dose to 0.5 mg weekly  If you have any questions or persistent side effects please call the office     Stop  METHIMAZOLE  Sensitivity 15 Elayne Snare 12/03/2020, 3:53 PM   This visit occurred during the SARS-CoV-2 public health emergency.  Safety protocols were in place, including screening questions prior to the visit, additional usage of staff PPE, and extensive cleaning of exam room while observing appropriate contact time as indicated for disinfecting solutions.   Total visit time for evaluation and management of multiple problems and counseling = 45 minutes

## 2020-12-03 NOTE — Therapy (Signed)
[place], [place], [address] Phone: [phone]   Fax:  [phone] Virtual Visit via Video Note  I connected with [name] on 12/03/20 at  12:00 PM EDT by a video enabled telemedicine application and verified that I am speaking with the correct person using two identifiers.  Location: Patient: Patient Home Provider: Clinic Office   I discussed the limitations of evaluation and management by telemedicine and the availability of in person appointments. The patient expressed understanding and agreed to proceed.   I discussed the assessment and treatment plan with the patient. The patient was provided an opportunity to ask questions and all were answered. The patient agreed with the plan and demonstrated an understanding of the instructions.   The patient was advised to call back or seek an in-person evaluation if the symptoms worsen or if the condition fails to improve as anticipated.  I provided 50 minutes of non-face-to-face time during this encounter.   Ponciano Ort, OT   Occupational Therapy Treatment  Patient Details  Name: Destiny Beasley MRN: [id number] Date of Birth: 06-Apr-1956 Referring Provider (OT): Ricky Ala   Encounter Date: 12/03/2020   OT End of Session - 12/03/20 1440     Visit Number 5    Number of Visits 20    Date for OT Re-Evaluation 12/27/20    Authorization Type No deduct Pt covered at 100%   Copay 35.00 waived since OOP has been met   OOP 6350- mET   Visit limit 20- 0 used   No auth required   S/w Timmothy Sours [id number]    Authorization - Number of Visits 20    OT Start Time 1200    OT Stop Time 1250    OT Time Calculation (min) 50 min    Activity Tolerance Patient tolerated treatment well    Behavior During Therapy WFL for tasks assessed/performed             Past Medical History:  Diagnosis Date   Anemia    Anxiety    CAD in native artery    a. Inf STEMI  06/9765 complicated by cardiogenic shock/complete heart block s/p emergent cath showing culprit large dominant LCx s/p aspiration thrombectomy and DES, EF 50% by cath, 40-45% by echo.   Chronic systolic heart failure ([place]) 06/25/2017   Echo 11/19: mild LVH, EF 40-45, inf-lat HK, Gr 1 DD, trivial MR   Complete heart block, transient ([place])    a. 06/2017 in setting of acute inf MI -> resolved after PCI.   COVID-19    Diabetes mellitus    Goiter    History of radiation therapy 06/14/2016 - 07/19/2016   Right Breast 50 Gy 25 fractions   Hypercholesteremia    Hypertension    Ischemic cardiomyopathy    a. EF 50% by cath and 40-45% by echo 06/2017.   Leg pain    ABIs 07/2019: Normal (R 1.27; L 1.25)   Malignant neoplasm of upper-outer quadrant of right female breast ([place]) 02/24/2016   Meningioma [facility])    Nuclear stress test    Nuclear stress test 10/19: EF 35, inferolateral, apical scar; inferior, apical inferior infarct with mild peri-infarct ischemia; high risk   Obesity    Personal history of chemotherapy    Personal history of radiation therapy    Pituitary tumor    Seasonal asthma    Sleep apnea    does not use every night   Stroke (  [place])    TIA - Dec 1 st, 2017    Past Surgical History:  Procedure Laterality Date   ABDOMINAL HYSTERECTOMY     BRAIN MENINGIOMA EXCISION  2005   BREAST BIOPSY     BREAST LUMPECTOMY Right    2017   BREAST LUMPECTOMY WITH RADIOACTIVE SEED LOCALIZATION Right 04/20/2016   Procedure: RIGHT BREAST LUMPECTOMY WITH RADIOACTIVE SEED LOCALIZATION;  Surgeon: Fanny Skates, MD;  Location: [facility];  Service: General;  Laterality: Right;   BREAST REDUCTION SURGERY Bilateral 10/23/2016   Procedure: BILATERAL BREAST REDUCTION WITH LIPOSUCTION ASSISTANCE;  Surgeon: Cristine Polio, MD;  Location: [place];  Service: Plastics;  Laterality: Bilateral;   CESAREAN SECTION     CORONARY STENT INTERVENTION N/A 06/25/2017   Procedure: CORONARY  STENT INTERVENTION;  Surgeon: Sherren Mocha, MD;  Location: [place] CV [facility];  Service: Cardiovascular;  Laterality: N/A;   CORONARY/GRAFT ACUTE MI REVASCULARIZATION N/A 06/25/2017   Procedure: Coronary/Graft Acute MI Revascularization;  Surgeon: Sherren Mocha, MD;  Location: [facility] [facility];  Service: Cardiovascular;  Laterality: N/A;   FOOT SURGERY Bilateral 2016   hammer toe surgery   LEFT HEART CATH AND CORONARY ANGIOGRAPHY N/A 06/25/2017   Procedure: LEFT HEART CATH AND CORONARY ANGIOGRAPHY;  Surgeon: Sherren Mocha, MD;  Location: [place] [facility];  Service: Cardiovascular;  Laterality: N/A;   LEFT HEART CATH AND CORONARY ANGIOGRAPHY N/A 05/13/2020   Procedure: LEFT HEART CATH AND CORONARY ANGIOGRAPHY;  Surgeon: Lorretta Harp, MD;  Location: [place] [facility];  Service: Cardiovascular;  Laterality: N/A;   MENISCUS REPAIR Left 2015   PITUITARY SURGERY     REDUCTION MAMMAPLASTY     RIGHT HEART CATH N/A 06/25/2017   Procedure: RIGHT HEART CATH;  Surgeon: Sherren Mocha, MD;  Location: [place] [facility];  Service: Cardiovascular;  Laterality: N/A;   THYROID SURGERY      There were no vitals filed for this visit.   Subjective Assessment - 12/03/20 1440     Currently in Pain? No/denies                                  OT Education - 12/03/20 1440     Education Details Educated on different communication styles with strategies to become more assertive with use of XYZ communication tool    Person(s) Educated Patient    Methods Explanation;Handout    Comprehension Verbalized understanding              OT Short Term Goals - 11/30/20 1527       OT SHORT TERM GOAL #1   Title Pt will actively engage in OT group sessions throughout duration of PHP programming, in order to promote daily structure, social engagement, and opportunities to develop and utilize adaptive strategies to maximize functional performance in preparation for safe transition and  integration back into school, work, and the community.    Status On-going      OT SHORT TERM GOAL #2   Title Pt will identify and implement 1-3 sleep hygiene strategies she can utilize, in order to improve sleep quality/ADL performance, in preparation for safe and healthy reintegration back into the community at discharge.    Status On-going      OT SHORT TERM GOAL #3   Title Pt will identify 1-3 stress management strategies she can utilize, in order to safely manage increased psychosocial stressors identified, with min  cues, in preparation for safe transition back to the community at discharge.    Status On-going           Group Session:  S: "I'm an assertive person for sure."  O: Group began with a reflection from previous OT session focused on communication styles and group members re-iterated what was learned during previous session. Members shared and reflected on any opportunities they were presented with last evening to practice their assertiveness skills or recognize patterns of communication observed. Today's group focused on assertiveness skills training and use of the XYZ* assertive communication tool was introduced. The XYZ communication tool states: I feel X when you do Y in situation Z and I would like _________. X is the emotion, Y is the specific behavior, and Z is the specific situation. Group members each formulated their own XYZ statement and shared with the group to discuss and offer feedback. Additional tips and strategies to practice being assertive were also introduced and discussed.   A: Monette was active and independent in her participation of discussion and activity, sharing that she thinks she is an Nurse, mental health and was able to offer several appropriate assertive responses and cues to other group members. Pt appeared open and receptive to use of XYZ formula and expressed interest in utilizing assertive tips reviewed in the future.   P: Continue to attend PHP  OT group sessions 5x week for 2 weeks to promote daily structure, social engagement, and opportunities to develop and utilize adaptive strategies to maximize functional performance in preparation for safe transition and integration back into school, work, and the community. Plan to address topic of fair fighting in next OT group session.   Plan - 12/03/20 1441     Occupational performance deficits (Please refer to evaluation for details): ADL's;IADL's;Rest and Sleep;Education;Work;Leisure;Social Participation    Body Structure / Function / Physical Skills ADL    Cognitive Skills Attention;Emotional;Energy/Drive;Learn;Memory;Perception;Problem Solve;Safety Awareness;Temperament/Personality;Thought;Understand    Psychosocial Skills Coping Strategies;Environmental  Adaptations;Habits;Interpersonal Interaction;Routines and Behaviors             Patient will benefit from skilled therapeutic intervention in order to improve the following deficits and impairments:   Body Structure / Function / Physical Skills: ADL Cognitive Skills: Attention, Emotional, Energy/Drive, Learn, Memory, Perception, Problem Solve, Safety Awareness, Temperament/Personality, Thought, Understand Psychosocial Skills: Coping Strategies, Environmental  Adaptations, Habits, Interpersonal Interaction, Routines and Behaviors   Visit Diagnosis: Difficulty coping  Frontal lobe and executive function deficit  Generalized anxiety disorder with panic attacks    Problem List Patient Active Problem List   Diagnosis Date Noted   Unstable angina ([place]) 05/12/2020   Chest pain with moderate risk for cardiac etiology 06/26/2019   History of TIA (transient ischemic attack) 08/08/2017   Malignant neoplasm of breast, stage 4 ([place]) 08/08/2017   History of complete heart block in setting of STEMI 06/2017 06/29/2017   OSA on CPAP 06/29/2017   CAD (coronary artery disease) 06/29/2017   Hx of Infero-Posterior ST Elevation MI in 06/2017  06/25/2017   History of cardiogenic shock in setting of STEMI in 06/2017 31/51/7616   Chronic systolic heart failure ([place]) 06/25/2017   Malignant neoplasm of upper-outer quadrant of right female breast ([place]) 02/24/2016   Type II diabetes mellitus, uncontrolled ([place]) 02/11/2014   Essential hypertension 09/07/2013   Atypical chest pain 11/11/2011   Anxiety 11/11/2011   Insulin dependent diabetes mellitus 11/11/2011   Hyperlipidemia LDL goal <70 11/11/2011    12/03/2020  Ponciano Ort, MOT, OTR/L 12/03/2020, 2:41  PM  [place], [place], [address] Phone: [phone]   Fax:  [phone]  Name: KALEEYA HANCOCK MRN: [id number] Date of Birth: 1955-12-16

## 2020-12-05 DIAGNOSIS — E1165 Type 2 diabetes mellitus with hyperglycemia: Secondary | ICD-10-CM

## 2020-12-05 DIAGNOSIS — Z794 Long term (current) use of insulin: Secondary | ICD-10-CM

## 2020-12-06 ENCOUNTER — Encounter (HOSPITAL_COMMUNITY): Payer: Self-pay

## 2020-12-06 ENCOUNTER — Other Ambulatory Visit (HOSPITAL_COMMUNITY): Payer: 59 | Admitting: Licensed Clinical Social Worker

## 2020-12-06 ENCOUNTER — Other Ambulatory Visit: Payer: Self-pay | Admitting: *Deleted

## 2020-12-06 ENCOUNTER — Other Ambulatory Visit: Payer: Self-pay

## 2020-12-06 ENCOUNTER — Other Ambulatory Visit (HOSPITAL_COMMUNITY): Payer: 59 | Admitting: Occupational Therapy

## 2020-12-06 DIAGNOSIS — R41844 Frontal lobe and executive function deficit: Secondary | ICD-10-CM

## 2020-12-06 DIAGNOSIS — F411 Generalized anxiety disorder: Secondary | ICD-10-CM

## 2020-12-06 DIAGNOSIS — F410 Panic disorder [episodic paroxysmal anxiety] without agoraphobia: Secondary | ICD-10-CM

## 2020-12-06 DIAGNOSIS — R4589 Other symptoms and signs involving emotional state: Secondary | ICD-10-CM

## 2020-12-06 MED ORDER — ENTRESTO 24-26 MG PO TABS: 1.0000 | ORAL_TABLET | Freq: Two times a day (BID) | ORAL | 11 refills | Status: DC

## 2020-12-06 NOTE — Therapy (Signed)
[place], [place], [address] Phone: [phone]   Fax:  [phone] Virtual Visit via Video Note  I connected with [name] on 12/06/20 at  12:00 PM EDT by a video enabled telemedicine application and verified that I am speaking with the correct person using two identifiers.  Location: Patient: Patient Home Provider: Clinic Office    I discussed the limitations of evaluation and management by telemedicine and the availability of in person appointments. The patient expressed understanding and agreed to proceed.   I discussed the assessment and treatment plan with the patient. The patient was provided an opportunity to ask questions and all were answered. The patient agreed with the plan and demonstrated an understanding of the instructions.   The patient was advised to call back or seek an in-person evaluation if the symptoms worsen or if the condition fails to improve as anticipated.  I provided 50 minutes of non-face-to-face time during this encounter.   Ponciano Ort, OT   Occupational Therapy Treatment  Patient Details  Name: Destiny Beasley MRN: [id number] Date of Birth: 1955-09-20 Referring Provider (OT): Ricky Ala   Encounter Date: 12/06/2020   OT End of Session - 12/06/20 1319     Visit Number 6    Number of Visits 20    Date for OT Re-Evaluation 12/27/20    Authorization Type No deduct Pt covered at 100%   Copay 35.00 waived since OOP has been met   OOP 6350- mET   Visit limit 20- 0 used   No auth required   S/w Timmothy Sours [id number]    Authorization - Number of Visits 20    OT Start Time 1200    OT Stop Time 1250    OT Time Calculation (min) 50 min    Activity Tolerance Patient tolerated treatment well    Behavior During Therapy WFL for tasks assessed/performed             Past Medical History:  Diagnosis Date   Anemia    Anxiety    CAD in native artery    a. Inf  STEMI 07/1322 complicated by cardiogenic shock/complete heart block s/p emergent cath showing culprit large dominant LCx s/p aspiration thrombectomy and DES, EF 50% by cath, 40-45% by echo.   Chronic systolic heart failure ([place]) 06/25/2017   Echo 11/19: mild LVH, EF 40-45, inf-lat HK, Gr 1 DD, trivial MR   Complete heart block, transient ([place])    a. 06/2017 in setting of acute inf MI -> resolved after PCI.   COVID-19    Diabetes mellitus    Goiter    History of radiation therapy 06/14/2016 - 07/19/2016   Right Breast 50 Gy 25 fractions   Hypercholesteremia    Hypertension    Ischemic cardiomyopathy    a. EF 50% by cath and 40-45% by echo 06/2017.   Leg pain    ABIs 07/2019: Normal (R 1.27; L 1.25)   Malignant neoplasm of upper-outer quadrant of right female breast ([place]) 02/24/2016   Meningioma [facility])    Nuclear stress test    Nuclear stress test 10/19: EF 35, inferolateral, apical scar; inferior, apical inferior infarct with mild peri-infarct ischemia; high risk   Obesity    Personal history of chemotherapy    Personal history of radiation therapy    Pituitary tumor    Seasonal asthma    Sleep apnea    does not use every night  Stroke [facility])    TIA - Dec 1 st, 2017    Past Surgical History:  Procedure Laterality Date   ABDOMINAL HYSTERECTOMY     BRAIN MENINGIOMA EXCISION  2005   BREAST BIOPSY     BREAST LUMPECTOMY Right    2017   BREAST LUMPECTOMY WITH RADIOACTIVE SEED LOCALIZATION Right 04/20/2016   Procedure: RIGHT BREAST LUMPECTOMY WITH RADIOACTIVE SEED LOCALIZATION;  Surgeon: Fanny Skates, MD;  Location: [place];  Service: General;  Laterality: Right;   BREAST REDUCTION SURGERY Bilateral 10/23/2016   Procedure: BILATERAL BREAST REDUCTION WITH LIPOSUCTION ASSISTANCE;  Surgeon: Cristine Polio, MD;  Location: [place];  Service: Plastics;  Laterality: Bilateral;   CESAREAN SECTION     CORONARY STENT INTERVENTION N/A 06/25/2017   Procedure:  CORONARY STENT INTERVENTION;  Surgeon: Sherren Mocha, MD;  Location: [place] [facility] LAB;  Service: Cardiovascular;  Laterality: N/A;   CORONARY/GRAFT ACUTE MI REVASCULARIZATION N/A 06/25/2017   Procedure: Coronary/Graft Acute MI Revascularization;  Surgeon: Sherren Mocha, MD;  Location: [place] [facility];  Service: Cardiovascular;  Laterality: N/A;   FOOT SURGERY Bilateral 2016   hammer toe surgery   LEFT HEART CATH AND CORONARY ANGIOGRAPHY N/A 06/25/2017   Procedure: LEFT HEART CATH AND CORONARY ANGIOGRAPHY;  Surgeon: Sherren Mocha, MD;  Location: [place] [facility];  Service: Cardiovascular;  Laterality: N/A;   LEFT HEART CATH AND CORONARY ANGIOGRAPHY N/A 05/13/2020   Procedure: LEFT HEART CATH AND CORONARY ANGIOGRAPHY;  Surgeon: Lorretta Harp, MD;  Location: [place] [facility];  Service: Cardiovascular;  Laterality: N/A;   MENISCUS REPAIR Left 2015   PITUITARY SURGERY     REDUCTION MAMMAPLASTY     RIGHT HEART CATH N/A 06/25/2017   Procedure: RIGHT HEART CATH;  Surgeon: Sherren Mocha, MD;  Location: [facility] [facility];  Service: Cardiovascular;  Laterality: N/A;   THYROID SURGERY      There were no vitals filed for this visit.   Subjective Assessment - 12/06/20 1318     Currently in Pain? No/denies                                  OT Education - 12/06/20 1319     Education Details Educated on how to navigate aggressive communication and reviewed strategies/tips to "fight fair"    Person(s) Educated Patient    Methods Explanation;Handout    Comprehension Verbalized understanding              OT Short Term Goals - 11/30/20 1527       OT SHORT TERM GOAL #1   Title Pt will actively engage in OT group sessions throughout duration of PHP programming, in order to promote daily structure, social engagement, and opportunities to develop and utilize adaptive strategies to maximize functional performance in preparation for safe transition and integration  back into school, work, and the community.    Status On-going      OT SHORT TERM GOAL #2   Title Pt will identify and implement 1-3 sleep hygiene strategies she can utilize, in order to improve sleep quality/ADL performance, in preparation for safe and healthy reintegration back into the community at discharge.    Status On-going      OT SHORT TERM GOAL #3   Title Pt will identify 1-3 stress management strategies she can utilize, in order to safely manage increased psychosocial stressors identified, with min cues, in preparation  for safe transition back to the community at discharge.    Status On-going           Group Session:  S: "I would tell them how I think and speak up for myself"  O: Group began with a reflection from previous OT session on assertiveness skills and training. Group members shared an example of how they practiced being assertive post group and into their evening. Today's group was an expansion on assertive training and communication with a focus on [place]. Group members were provided with a set of 'rules' and tips on how to fight fairly, in order to come off as more assertive and less aggressive. Members identified one rule or tip they felt they struggled most with and were encouraged to focus on improving their communication within identified rule.    A: Torrence was active and independent in her participation of discussion and activity, sharing several relevant and appropriate responses to scenarios presented. Pt was fluent in her ability to share assertive statements and offered strategies to the group. Pt appeared both open and receptive to education received on learning to fight fairly and navigate arguments effectively.   P: Continue to attend PHP OT group sessions 5x week for 2 weeks to promote daily structure, social engagement, and opportunities to develop and utilize adaptive strategies to maximize functional performance in preparation for safe transition and  integration back into school, work, and the community. Plan to address topic of self-care in next OT group session.  Plan - 12/06/20 1319     Occupational performance deficits (Please refer to evaluation for details): ADL's;IADL's;Rest and Sleep;Education;Work;Leisure;Social Participation    Body Structure / Function / Physical Skills ADL    Cognitive Skills Attention;Emotional;Energy/Drive;Learn;Memory;Perception;Problem Solve;Safety Awareness;Temperament/Personality;Thought;Understand    Psychosocial Skills Coping Strategies;Environmental  Adaptations;Habits;Interpersonal Interaction;Routines and Behaviors             Patient will benefit from skilled therapeutic intervention in order to improve the following deficits and impairments:   Body Structure / Function / Physical Skills: ADL Cognitive Skills: Attention, Emotional, Energy/Drive, Learn, Memory, Perception, Problem Solve, Safety Awareness, Temperament/Personality, Thought, Understand Psychosocial Skills: Coping Strategies, Environmental  Adaptations, Habits, Interpersonal Interaction, Routines and Behaviors   Visit Diagnosis: Difficulty coping  Frontal lobe and executive function deficit  Generalized anxiety disorder with panic attacks    Problem List Patient Active Problem List   Diagnosis Date Noted   Unstable angina ([place]) 05/12/2020   Chest pain with moderate risk for cardiac etiology 06/26/2019   History of TIA (transient ischemic attack) 08/08/2017   Malignant neoplasm of breast, stage 4 ([place]) 08/08/2017   History of complete heart block in setting of STEMI 06/2017 06/29/2017   OSA on CPAP 06/29/2017   CAD (coronary artery disease) 06/29/2017   Hx of Infero-Posterior ST Elevation MI in 06/2017 06/25/2017   History of cardiogenic shock in setting of STEMI in 06/2017 97/41/6384   Chronic systolic heart failure ([place]) 06/25/2017   Malignant neoplasm of upper-outer quadrant of right female breast ([place]) 02/24/2016    Type II diabetes mellitus, uncontrolled ([place]) 02/11/2014   Essential hypertension 09/07/2013   Atypical chest pain 11/11/2011   Anxiety 11/11/2011   Insulin dependent diabetes mellitus 11/11/2011   Hyperlipidemia LDL goal <70 11/11/2011    12/06/2020  Ponciano Ort, MOT, OTR/L  12/06/2020, 1:20 PM  [place], [place], [address] Phone: [phone]   Fax:  [phone]  Name: LUCEIL HERRIN MRN:  [id number] Date of Birth: [birth year]

## 2020-12-07 ENCOUNTER — Other Ambulatory Visit (HOSPITAL_COMMUNITY): Payer: 59 | Admitting: Occupational Therapy

## 2020-12-07 ENCOUNTER — Other Ambulatory Visit: Payer: Self-pay

## 2020-12-07 ENCOUNTER — Encounter (HOSPITAL_COMMUNITY): Payer: Self-pay

## 2020-12-07 ENCOUNTER — Other Ambulatory Visit (HOSPITAL_COMMUNITY): Payer: 59 | Admitting: Licensed Clinical Social Worker

## 2020-12-07 DIAGNOSIS — R41844 Frontal lobe and executive function deficit: Secondary | ICD-10-CM

## 2020-12-07 DIAGNOSIS — F410 Panic disorder [episodic paroxysmal anxiety] without agoraphobia: Secondary | ICD-10-CM

## 2020-12-07 DIAGNOSIS — R4589 Other symptoms and signs involving emotional state: Secondary | ICD-10-CM

## 2020-12-07 DIAGNOSIS — F411 Generalized anxiety disorder: Secondary | ICD-10-CM | POA: Diagnosis not present

## 2020-12-07 NOTE — Progress Notes (Signed)
Spoke with patient via Webex video call, used 2 identifiers to correctly identify patient. States that groups are going well and is enjoying them. She had a tooth pulled yesterday that had been causing her much pain and feels so much better today. Denies SI/HI or AV hallucinations. On scale 1-10 as 10 being worst she rates depression at 0 and anxiety at 0. States that her anxiety comes in the evenings after groups when she is all alone with no one to talk to. She really enjoys the company of group therapy. No issues or complaints. No side effects from medications.

## 2020-12-07 NOTE — Progress Notes (Signed)
Virtual Visit via Video Note  I connected with Destiny Beasley on 12/07/20 at  9:00 AM EDT by a video enabled telemedicine application and verified that I am speaking with the correct person using two identifiers.  Location: Patient: Home Provider: Office   I discussed the limitations of evaluation and management by telemedicine and the availability of in person appointments. The patient expressed understanding and agreed to proceed.    I discussed the assessment and treatment plan with the patient. The patient was provided an opportunity to ask questions and all were answered. The patient agreed with the plan and demonstrated an understanding of the instructions.   The patient was advised to call back or seek an in-person evaluation if the symptoms worsen or if the condition fails to improve as anticipated.  I provided 15 minutes of non-face-to-face time during this encounter.   Derrill Center, NP   [facility] Partial Hospitalization  Outpatient Program Discharge Summary  MARIDA PULIDO [id number]  Admission date: 11/30/2020 Discharge date: 12/11/2020  Reason for admission: Per admission assessment note: Destiny Beasley is a 65 y.o. African American female presents with generalized anxiety disorder and panic attacks.  Reports a history of depression and anxiety.  States more recently panic attacks have been intense.  Reports ongoing racing thoughts and sense of impending doom.  States she has been prescribed Effexor which was helpful however she felt as if the medication had stopped working.  States she has been taking this medication since she resided in [place].states her husband passed 20 years prior.  No change in routine, did report her primary care provider started her on Zoloft medication. " This wasn't a good fit for me." Chart review patient has hypothyroid disorder.  States she continues to be followed by endocrinology for management.  Denied previous inpatient  admissions.  Denied previous suicide attempts.  Reports overall "I had a good stress-free life" patient denied illicit drug use or substance abuse history. Patient was enrolled in partial psychiatric program on 11/30/20.   Progress in Program Toward Treatment Goals: Ongoing , Gurpreet Granados has attended and participated with daily group session with active and engaged participation.  Denying suicidal or homicidal ideations.  Denies auditory or visual hallucinations.  Reports she has been taking and tolerating medications well.  Rating her depression and anxiety 2 out of 10 with 10 being the worst.  States she has been utilizing hydroxyzine which has been helpful.  Continues to work on Radiographer, therapeutic.   Progress (rationale): Patient to transition to Intensive outpatient programming (IOP)  Take all medications as prescribed. Keep all follow-up appointments as scheduled.  Do not consume alcohol or use illegal drugs while on prescription medications. Report any adverse effects from your medications to your primary care provider promptly.  In the event of recurrent symptoms or worsening symptoms, call 911, a crisis hotline, or go to the nearest emergency department for evaluation.    Derrill Center, NP 12/07/2020

## 2020-12-07 NOTE — Therapy (Signed)
[place], [place], [address] Phone: [phone]   Fax:  [phone] Virtual Visit via Video Note  I connected with Destiny Beasley on 12/07/20 at  12:00 PM EDT by a video enabled telemedicine application and verified that I am speaking with the correct person using two identifiers.  Location: Patient: Patient Home Provider: Clinic Office   I discussed the limitations of evaluation and management by telemedicine and the availability of in person appointments. The patient expressed understanding and agreed to proceed.   I discussed the assessment and treatment plan with the patient. The patient was provided an opportunity to ask questions and all were answered. The patient agreed with the plan and demonstrated an understanding of the instructions.   The patient was advised to call back or seek an in-person evaluation if the symptoms worsen or if the condition fails to improve as anticipated.  I provided 50 minutes of non-face-to-face time during this encounter.   Destiny Beasley, OT   Occupational Therapy Treatment  Patient Details  Name: Destiny Beasley MRN: [id number] Date of Birth: 30-Oct-1955 Referring Provider (OT): Ricky Ala   Encounter Date: 12/07/2020   OT End of Session - 12/07/20 1506     Visit Number 7    Number of Visits 20    Date for OT Re-Evaluation 12/27/20    Authorization Type No deduct Pt covered at 100%   Copay 35.00 waived since OOP has been met   OOP 6350- mET   Visit limit 20- 0 used   No auth required   S/w Timmothy Sours [id number]    Authorization - Number of Visits 20    OT Start Time 1200    OT Stop Time 1250    OT Time Calculation (min) 50 min    Activity Tolerance Patient tolerated treatment well    Behavior During Therapy WFL for tasks assessed/performed             Past Medical History:  Diagnosis Date   Anemia    Anxiety    CAD in native artery    a. Inf STEMI  07/1322 complicated by cardiogenic shock/complete heart block s/p emergent cath showing culprit large dominant LCx s/p aspiration thrombectomy and DES, EF 50% by cath, 40-45% by echo.   Chronic systolic heart failure ([place]) 06/25/2017   Echo 11/19: mild LVH, EF 40-45, inf-lat HK, Gr 1 DD, trivial MR   Complete heart block, transient ([place])    a. 06/2017 in setting of acute inf MI -> resolved after PCI.   COVID-19    Diabetes mellitus    Goiter    History of radiation therapy 06/14/2016 - 07/19/2016   Right Breast 50 Gy 25 fractions   Hypercholesteremia    Hypertension    Ischemic cardiomyopathy    a. EF 50% by cath and 40-45% by echo 06/2017.   Leg pain    ABIs 07/2019: Normal (R 1.27; L 1.25)   Malignant neoplasm of upper-outer quadrant of right female breast ([place]) 02/24/2016   Meningioma [facility])    Nuclear stress test    Nuclear stress test 10/19: EF 35, inferolateral, apical scar; inferior, apical inferior infarct with mild peri-infarct ischemia; high risk   Obesity    Personal history of chemotherapy    Personal history of radiation therapy    Pituitary tumor    Seasonal asthma    Sleep apnea    does not use every night   Stroke (  [place])    TIA - Dec 1 st, 2017    Past Surgical History:  Procedure Laterality Date   ABDOMINAL HYSTERECTOMY     BRAIN MENINGIOMA EXCISION  2005   BREAST BIOPSY     BREAST LUMPECTOMY Right    2017   BREAST LUMPECTOMY WITH RADIOACTIVE SEED LOCALIZATION Right 04/20/2016   Procedure: RIGHT BREAST LUMPECTOMY WITH RADIOACTIVE SEED LOCALIZATION;  Surgeon: Fanny Skates, MD;  Location: [place];  Service: General;  Laterality: Right;   BREAST REDUCTION SURGERY Bilateral 10/23/2016   Procedure: BILATERAL BREAST REDUCTION WITH LIPOSUCTION ASSISTANCE;  Surgeon: Cristine Polio, MD;  Location: [place];  Service: Plastics;  Laterality: Bilateral;   CESAREAN SECTION     CORONARY STENT INTERVENTION N/A 06/25/2017   Procedure: CORONARY  STENT INTERVENTION;  Surgeon: Sherren Mocha, MD;  Location: [place] CV LAB;  Service: Cardiovascular;  Laterality: N/A;   CORONARY/GRAFT ACUTE MI REVASCULARIZATION N/A 06/25/2017   Procedure: Coronary/Graft Acute MI Revascularization;  Surgeon: Sherren Mocha, MD;  Location: [place] CV LAB;  Service: Cardiovascular;  Laterality: N/A;   FOOT SURGERY Bilateral 2016   hammer toe surgery   LEFT HEART CATH AND CORONARY ANGIOGRAPHY N/A 06/25/2017   Procedure: LEFT HEART CATH AND CORONARY ANGIOGRAPHY;  Surgeon: Sherren Mocha, MD;  Location: [place] CV LAB;  Service: Cardiovascular;  Laterality: N/A;   LEFT HEART CATH AND CORONARY ANGIOGRAPHY N/A 05/13/2020   Procedure: LEFT HEART CATH AND CORONARY ANGIOGRAPHY;  Surgeon: Lorretta Harp, MD;  Location: [place] CV LAB;  Service: Cardiovascular;  Laterality: N/A;   MENISCUS REPAIR Left 2015   PITUITARY SURGERY     REDUCTION MAMMAPLASTY     RIGHT HEART CATH N/A 06/25/2017   Procedure: RIGHT HEART CATH;  Surgeon: Sherren Mocha, MD;  Location: [place] CV LAB;  Service: Cardiovascular;  Laterality: N/A;   THYROID SURGERY      There were no vitals filed for this visit.   Subjective Assessment - 12/07/20 1505     Currently in Pain? No/denies                                  OT Education - 12/07/20 1505     Education Details Educated on low vs positive self-esteem and reviewed six strategies to boost low self-esteem    Person(s) Educated Patient    Methods Explanation;Handout    Comprehension Verbalized understanding              OT Short Term Goals - 11/30/20 1527       OT SHORT TERM GOAL #1   Title Pt will actively engage in OT group sessions throughout duration of PHP programming, in order to promote daily structure, social engagement, and opportunities to develop and utilize adaptive strategies to maximize functional performance in preparation for safe transition and integration back into  school, work, and the community.    Status On-going      OT SHORT TERM GOAL #2   Title Pt will identify and implement 1-3 sleep hygiene strategies she can utilize, in order to improve sleep quality/ADL performance, in preparation for safe and healthy reintegration back into the community at discharge.    Status On-going      OT SHORT TERM GOAL #3   Title Pt will identify 1-3 stress management strategies she can utilize, in order to safely manage increased psychosocial stressors identified, with min cues, in preparation  for safe transition back to the community at discharge.    Status On-going           Group Session:  S: "I am a strong independent black woman"  O: Today's group session encouraged increased engagement and participation through discussion focused on self-esteem. Topic of discussion focused on identifying differences between low vs high self-esteem and identified ways in which our self-esteem impacts our daily lives including: self-criticism, ignoring positive qualities, negative emotions, work/school performance, relationships, leisure engagement, and self-care. Group members further identified ways in which their self-esteem directly impacts their daily function. Tips and strategies were shared to boost and build-up our low self-esteem, including exercise, mindfulness/meditation, postures of confidence, surrounding yourself with positive people, positive affirmations, and self-care. Group members committed to trying one of the above-mentioned strategies over their weekend.    A: Trayonna was active and independent in her participation of discussion and shared that she does not have low self-esteem and is confident. Pt receptive to strategies reviewed and shared a positive affirmation that stood out to her "All of my problems have solutions" Pt also shared three positive ways to describe herself "caring, independent, and a strong black woman"  P: Continue to attend PHP OT group  sessions 5x week for 2 weeks to promote daily structure, social engagement, and opportunities to develop and utilize adaptive strategies to maximize functional performance in preparation for safe transition and integration back into school, work, and the community. Plan to address topic of safety planning in next OT group session.  Plan - 12/07/20 1506     Occupational performance deficits (Please refer to evaluation for details): ADL's;IADL's;Rest and Sleep;Education;Work;Leisure;Social Participation    Body Structure / Function / Physical Skills ADL    Cognitive Skills Attention;Emotional;Energy/Drive;Learn;Memory;Perception;Problem Solve;Safety Awareness;Temperament/Personality;Thought;Understand    Psychosocial Skills Coping Strategies;Environmental  Adaptations;Habits;Interpersonal Interaction;Routines and Behaviors             Patient will benefit from skilled therapeutic intervention in order to improve the following deficits and impairments:   Body Structure / Function / Physical Skills: ADL Cognitive Skills: Attention, Emotional, Energy/Drive, Learn, Memory, Perception, Problem Solve, Safety Awareness, Temperament/Personality, Thought, Understand Psychosocial Skills: Coping Strategies, Environmental  Adaptations, Habits, Interpersonal Interaction, Routines and Behaviors   Visit Diagnosis: Difficulty coping  Frontal lobe and executive function deficit  Generalized anxiety disorder with panic attacks    Problem List Patient Active Problem List   Diagnosis Date Noted   Unstable angina ([place]) 05/12/2020   Chest pain with moderate risk for cardiac etiology 06/26/2019   History of TIA (transient ischemic attack) 08/08/2017   Malignant neoplasm of breast, stage 4 ([place]) 08/08/2017   History of complete heart block in setting of STEMI 06/2017 06/29/2017   OSA on CPAP 06/29/2017   CAD (coronary artery disease) 06/29/2017   Hx of Infero-Posterior ST Elevation MI in 06/2017  06/25/2017   History of cardiogenic shock in setting of STEMI in 06/2017 95/18/8416   Chronic systolic heart failure ([place]) 06/25/2017   Malignant neoplasm of upper-outer quadrant of right female breast ([place]) 02/24/2016   Type II diabetes mellitus, uncontrolled ([place]) 02/11/2014   Essential hypertension 09/07/2013   Atypical chest pain 11/11/2011   Anxiety 11/11/2011   Insulin dependent diabetes mellitus 11/11/2011   Hyperlipidemia LDL goal <70 11/11/2011    12/07/2020  Destiny Beasley, MOT, OTR/L  12/07/2020, 3:07 PM  [place], [place], [address] Phone: [phone]   Fax:  [phone]  Name: Destiny Beasley MRN: [id number] Date of Birth: [birth year]

## 2020-12-08 ENCOUNTER — Other Ambulatory Visit (HOSPITAL_COMMUNITY): Payer: 59 | Admitting: Occupational Therapy

## 2020-12-08 ENCOUNTER — Encounter (HOSPITAL_COMMUNITY): Payer: Self-pay

## 2020-12-08 ENCOUNTER — Other Ambulatory Visit: Payer: Self-pay

## 2020-12-08 ENCOUNTER — Other Ambulatory Visit (HOSPITAL_COMMUNITY): Payer: 59 | Admitting: Licensed Clinical Social Worker

## 2020-12-08 DIAGNOSIS — R41844 Frontal lobe and executive function deficit: Secondary | ICD-10-CM

## 2020-12-08 DIAGNOSIS — F410 Panic disorder [episodic paroxysmal anxiety] without agoraphobia: Secondary | ICD-10-CM

## 2020-12-08 DIAGNOSIS — F411 Generalized anxiety disorder: Secondary | ICD-10-CM | POA: Diagnosis not present

## 2020-12-08 DIAGNOSIS — R4589 Other symptoms and signs involving emotional state: Secondary | ICD-10-CM

## 2020-12-08 MED ORDER — LIRAGLUTIDE 18 MG/3ML ~~LOC~~ SOPN: PEN_INJECTOR | SUBCUTANEOUS | 2 refills | Status: DC

## 2020-12-08 NOTE — Therapy (Signed)
Destiny Beasley, [place], [address] Phone: [phone]   Fax:  [phone] Virtual Visit via Video Note  I connected with Destiny Beasley on 12/08/20 at  12:00 PM EDT by a video enabled telemedicine application and verified that I am speaking with the correct person using two identifiers.  Location: Patient: Patient Home Provider: Clinic Office   I discussed the limitations of evaluation and management by telemedicine and the availability of in person appointments. The patient expressed understanding and agreed to proceed.   I discussed the assessment and treatment plan with the patient. The patient was provided an opportunity to ask questions and all were answered. The patient agreed with the plan and demonstrated an understanding of the instructions.   The patient was advised to call back or seek an in-person evaluation if the symptoms worsen or if the condition fails to improve as anticipated.  I provided 45 minutes of non-face-to-face time during this encounter.   Destiny Beasley, OT   Occupational Therapy Treatment  Patient Details  Name: Destiny Beasley MRN: [id number] Date of Birth: [birth year] Referring Provider (OT): Ricky Ala   Encounter Date: 12/08/2020   OT End of Session - 12/08/20 1407     Visit Number 8    Number of Visits 20    Date for OT Re-Evaluation 12/27/20    Authorization Type No deduct Pt covered at 100%   Copay 35.00 waived since OOP has been met   OOP 6350- mET   Visit limit 20- 0 used   No auth required   S/w Timmothy Sours [id number]    Authorization - Number of Visits 20    OT Start Time 1205    OT Stop Time 1250    OT Time Calculation (min) 45 min    Activity Tolerance Patient tolerated treatment well    Behavior During Therapy WFL for tasks assessed/performed             Past Medical History:  Diagnosis Date   Anemia    Anxiety    CAD in native artery    a. Inf STEMI  08/4006 complicated by cardiogenic shock/complete heart block s/p emergent cath showing culprit large dominant LCx s/p aspiration thrombectomy and DES, EF 50% by cath, 40-45% by echo.   Chronic systolic heart failure ([place]) 06/25/2017   Echo 11/19: mild LVH, EF 40-45, inf-lat HK, Gr 1 DD, trivial MR   Complete heart block, transient ([place])    a. 06/2017 in setting of acute inf MI -> resolved after PCI.   COVID-19    Diabetes mellitus    Goiter    History of radiation therapy 06/14/2016 - 07/19/2016   Right Breast 50 Gy 25 fractions   Hypercholesteremia    Hypertension    Ischemic cardiomyopathy    a. EF 50% by cath and 40-45% by echo 06/2017.   Leg pain    ABIs 07/2019: Normal (R 1.27; L 1.25)   Malignant neoplasm of upper-outer quadrant of right female breast ([place]) 02/24/2016   Meningioma [facility])    Nuclear stress test    Nuclear stress test 10/19: EF 35, inferolateral, apical scar; inferior, apical inferior infarct with mild peri-infarct ischemia; high risk   Obesity    Personal history of chemotherapy    Personal history of radiation therapy    Pituitary tumor    Seasonal asthma    Sleep apnea    does not use every night   Stroke (  [place])    TIA - Dec 1 st, 2017    Past Surgical History:  Procedure Laterality Date   ABDOMINAL HYSTERECTOMY     BRAIN MENINGIOMA EXCISION  2005   BREAST BIOPSY     BREAST LUMPECTOMY Right    2017   BREAST LUMPECTOMY WITH RADIOACTIVE SEED LOCALIZATION Right 04/20/2016   Procedure: RIGHT BREAST LUMPECTOMY WITH RADIOACTIVE SEED LOCALIZATION;  Surgeon: Fanny Skates, MD;  Location: [place];  Service: General;  Laterality: Right;   BREAST REDUCTION SURGERY Bilateral 10/23/2016   Procedure: BILATERAL BREAST REDUCTION WITH LIPOSUCTION ASSISTANCE;  Surgeon: Cristine Polio, MD;  Location: [place];  Service: Plastics;  Laterality: Bilateral;   CESAREAN SECTION     CORONARY STENT INTERVENTION N/A 06/25/2017   Procedure: CORONARY  STENT INTERVENTION;  Surgeon: Sherren Mocha, MD;  Location: [place] CV LAB;  Service: Cardiovascular;  Laterality: N/A;   CORONARY/GRAFT ACUTE MI REVASCULARIZATION N/A 06/25/2017   Procedure: Coronary/Graft Acute MI Revascularization;  Surgeon: Sherren Mocha, MD;  Location: [place] CV LAB;  Service: Cardiovascular;  Laterality: N/A;   FOOT SURGERY Bilateral 2016   hammer toe surgery   LEFT HEART CATH AND CORONARY ANGIOGRAPHY N/A 06/25/2017   Procedure: LEFT HEART CATH AND CORONARY ANGIOGRAPHY;  Surgeon: Sherren Mocha, MD;  Location: [place] CV LAB;  Service: Cardiovascular;  Laterality: N/A;   LEFT HEART CATH AND CORONARY ANGIOGRAPHY N/A 05/13/2020   Procedure: LEFT HEART CATH AND CORONARY ANGIOGRAPHY;  Surgeon: Lorretta Harp, MD;  Location: [place] CV LAB;  Service: Cardiovascular;  Laterality: N/A;   MENISCUS REPAIR Left 2015   PITUITARY SURGERY     REDUCTION MAMMAPLASTY     RIGHT HEART CATH N/A 06/25/2017   Procedure: RIGHT HEART CATH;  Surgeon: Sherren Mocha, MD;  Location: [place] CV LAB;  Service: Cardiovascular;  Laterality: N/A;   THYROID SURGERY      There were no vitals filed for this visit.   Subjective Assessment - 12/08/20 1407     Currently in Pain? No/denies                                  OT Education - 12/08/20 1407     Education Details Educated on identifying coping strategies, social supports, and community mental health resources available through use of safety planning tool    Person(s) Educated Patient    Methods Explanation;Handout    Comprehension Verbalized understanding              OT Short Term Goals - 12/08/20 1408       OT SHORT TERM GOAL #1   Title Pt will actively engage in OT group sessions throughout duration of PHP programming, in order to promote daily structure, social engagement, and opportunities to develop and utilize adaptive strategies to maximize functional performance in  preparation for safe transition and integration back into school, work, and the community.    Status Achieved      OT SHORT TERM GOAL #2   Title Pt will identify and implement 1-3 sleep hygiene strategies she can utilize, in order to improve sleep quality/ADL performance, in preparation for safe and healthy reintegration back into the community at discharge.    Status Achieved      OT SHORT TERM GOAL #3   Title Pt will identify 1-3 stress management strategies she can utilize, in order to safely manage increased psychosocial stressors identified,  with min cues, in preparation for safe transition back to the community at discharge.    Status Achieved           Group Session:  S: "I get that heat flush right before I am about to have a panic attack."  O: Today's group discussion focused on the topic of Safety Planning. Patients were educated on what a safety plan is, what it can be used for, and why we should create one. Group then worked collaboratively and independently to create an individualized safety plan including identifying warning signs, coping strategies, places to go for distraction, social supports, professional supports, and how to make the environment safe. Group members were also encouraged to reflect on the question "What is life worth living for?" Group session ended with patients encouraged to utilize their safety plan as an all-inclusive resource when experiencing a mental health crisis.    A: Destiny Beasley was active and independent in her participation of discussion and activity, sharing that one of her warning signs is flushing in the face and getting hot. She identified places for distractions as [employer] and going to her backyard. Pt also identified benefit from turning on a calming TV show or deep breathing. Appeared receptive to further education provided on safety planning.   P: Plan to discharge patient on Friday from [facility], discharge from OT services, effective today 12/08/2020  with plan to follow up and begin [facility] on Monday.  OCCUPATIONAL THERAPY DISCHARGE SUMMARY  Visits from Start of Care: 8  Current functional level related to goals / functional outcomes: Pt has achieved 3/3 OT goals within the [facility] program. Pt is scheduled to discharge today with plan to follow up and begin [place] on Monday for on-going therapy and treatment needs.    Remaining deficits: See above   Education / Equipment: See above   Patient agrees to discharge. Patient goals were met. Patient is being discharged due to meeting the stated rehab goals..     Plan - 12/08/20 1407     Occupational performance deficits (Please refer to evaluation for details): ADL's;IADL's;Rest and Sleep;Education;Work;Leisure;Social Participation    Body Structure / Function / Physical Skills ADL    Cognitive Skills Attention;Emotional;Energy/Drive;Learn;Memory;Perception;Problem Solve;Safety Awareness;Temperament/Personality;Thought;Understand    Psychosocial Skills Coping Strategies;Environmental  Adaptations;Habits;Interpersonal Interaction;Routines and Behaviors             Patient will benefit from skilled therapeutic intervention in order to improve the following deficits and impairments:   Body Structure / Function / Physical Skills: ADL Cognitive Skills: Attention, Emotional, Energy/Drive, Learn, Memory, Perception, Problem Solve, Safety Awareness, Temperament/Personality, Thought, Understand Psychosocial Skills: Coping Strategies, Environmental  Adaptations, Habits, Interpersonal Interaction, Routines and Behaviors   Visit Diagnosis: Difficulty coping  Frontal lobe and executive function deficit  Generalized anxiety disorder with panic attacks    Problem List Patient Active Problem List   Diagnosis Date Noted   Unstable angina ([place]) 05/12/2020   Chest pain with moderate risk for cardiac etiology 06/26/2019   History of TIA (transient ischemic attack) 08/08/2017   Malignant  neoplasm of breast, stage 4 ([place]) 08/08/2017   History of complete heart block in setting of STEMI 06/2017 06/29/2017   OSA on CPAP 06/29/2017   CAD (coronary artery disease) 06/29/2017   Hx of Infero-Posterior ST Elevation MI in 06/2017 06/25/2017   History of cardiogenic shock in setting of STEMI in 06/2017 82/95/6213   Chronic systolic heart failure ([place]) 06/25/2017   Malignant neoplasm of upper-outer quadrant of right female breast ([place]) 02/24/2016  Type II diabetes mellitus, uncontrolled ([place]) 02/11/2014   Essential hypertension 09/07/2013   Atypical chest pain 11/11/2011   Anxiety 11/11/2011   Insulin dependent diabetes mellitus 11/11/2011   Hyperlipidemia LDL goal <70 11/11/2011    12/08/2020  Destiny Beasley, MOT, OTR/L  12/08/2020, 2:08 PM  [place], [place], [address] Phone: [phone]   Fax:  [phone]  Name: Destiny Beasley MRN: [id number] Date of Birth: 07-17-1955

## 2020-12-09 ENCOUNTER — Other Ambulatory Visit (HOSPITAL_COMMUNITY): Payer: 59 | Admitting: Licensed Clinical Social Worker

## 2020-12-09 ENCOUNTER — Other Ambulatory Visit (HOSPITAL_COMMUNITY): Payer: 59

## 2020-12-09 ENCOUNTER — Other Ambulatory Visit: Payer: Self-pay

## 2020-12-09 DIAGNOSIS — F410 Panic disorder [episodic paroxysmal anxiety] without agoraphobia: Secondary | ICD-10-CM

## 2020-12-10 ENCOUNTER — Other Ambulatory Visit (HOSPITAL_COMMUNITY): Payer: 59 | Admitting: Licensed Clinical Social Worker

## 2020-12-10 ENCOUNTER — Other Ambulatory Visit: Payer: Self-pay

## 2020-12-10 ENCOUNTER — Other Ambulatory Visit (HOSPITAL_COMMUNITY): Payer: 59

## 2020-12-10 DIAGNOSIS — F410 Panic disorder [episodic paroxysmal anxiety] without agoraphobia: Secondary | ICD-10-CM

## 2020-12-10 DIAGNOSIS — R4589 Other symptoms and signs involving emotional state: Secondary | ICD-10-CM

## 2020-12-13 ENCOUNTER — Other Ambulatory Visit (HOSPITAL_COMMUNITY): Payer: 59 | Admitting: Licensed Clinical Social Worker

## 2020-12-13 ENCOUNTER — Other Ambulatory Visit: Payer: Self-pay

## 2020-12-13 DIAGNOSIS — F410 Panic disorder [episodic paroxysmal anxiety] without agoraphobia: Secondary | ICD-10-CM

## 2020-12-13 NOTE — Progress Notes (Signed)
Virtual Visit via Video Note  I connected with Destiny Beasley on 12/13/20 at  9:00 AM EDT by a video enabled telemedicine application and verified that I am speaking with the correct person using two identifiers.  Location: Patient: patient home Provider: clinical home office   I discussed the limitations of evaluation and management by telemedicine and the availability of in person appointments. The patient expressed understanding and agreed to proceed.  I discussed the assessment and treatment plan with the patient. The patient was provided an opportunity to ask questions and all were answered. The patient agreed with the plan and demonstrated an understanding of the instructions.   The patient was advised to call back or seek an in-person evaluation if the symptoms worsen or if the condition fails to improve as anticipated.  I provided 15 minutes of non-face-to-face time during this encounter.   [name], LCSW  Transition PHP to IOP  Per [facility] on 11/24/20:  Pt presents as PHP referral from the [place] ED after a panic attack. Pt reports history of anxiety most of her life, however over the past 4-5 months has been having panic attacks which are new for her. Pt states having panic attacks 3-4 days/week, and has multiple panic attacks a day on those days. Pt states these panic attacks have interfered with her work and day-to-day activities and "mess my nerves all up." Pt reports she is unaware of triggers for the panic attacks and the unpredictability has made her reticent to leave the house. Pt is currently followed by her PCP and was put on FMLA as of 11/11/20. Pt reports good support system, lives alone, and enjoys her work. Pt denies problematic substance use, AVH, and SI/HI.      Patient transitioned from [facility] to [facility] today.  Pt attended nine days in PHP. On a scale of 1-10 (10 being the worst); pt rates her depression at a 1 and anxiety at a 7.  Denies SI/HI or A/V hallucinations.  A:   Oriented pt to MH-IOP.  Pt gave verbal consent for tx, to release chart information to referred providers and to complete any forms if needed.  Pt also gave consent for attending group virtually d/t COVID-19 social distancing restrictions.  Encouraged support groups through [facility].  R:  Pt receptive.

## 2020-12-14 ENCOUNTER — Other Ambulatory Visit (HOSPITAL_COMMUNITY): Payer: 59 | Admitting: Psychiatry

## 2020-12-14 ENCOUNTER — Other Ambulatory Visit: Payer: Self-pay

## 2020-12-14 ENCOUNTER — Encounter (HOSPITAL_COMMUNITY): Payer: Self-pay

## 2020-12-14 ENCOUNTER — Ambulatory Visit (INDEPENDENT_AMBULATORY_CARE_PROVIDER_SITE_OTHER): Payer: 59 | Admitting: Pharmacist

## 2020-12-14 ENCOUNTER — Telehealth: Payer: Self-pay | Admitting: Pharmacist

## 2020-12-14 VITALS — BP 182/90 | HR 86 | Wt 268.0 lb

## 2020-12-14 DIAGNOSIS — I5022 Chronic systolic (congestive) heart failure: Secondary | ICD-10-CM

## 2020-12-14 DIAGNOSIS — F411 Generalized anxiety disorder: Secondary | ICD-10-CM

## 2020-12-14 DIAGNOSIS — I10 Essential (primary) hypertension: Secondary | ICD-10-CM

## 2020-12-14 DIAGNOSIS — F410 Panic disorder [episodic paroxysmal anxiety] without agoraphobia: Secondary | ICD-10-CM

## 2020-12-14 LAB — BASIC METABOLIC PANEL
BUN/Creatinine Ratio: 18 (ref 12–28)
BUN: 15 mg/dL (ref 8–27)
CO2: 24 mmol/L (ref 20–29)
Calcium: 9.4 mg/dL (ref 8.7–10.3)
Chloride: 100 mmol/L (ref 96–106)
Creatinine, Ser: 0.82 mg/dL (ref 0.57–1.00)
Glucose: 229 mg/dL — ABNORMAL HIGH (ref 65–99)
Potassium: 3.9 mmol/L (ref 3.5–5.2)
Sodium: 140 mmol/L (ref 134–144)
eGFR: 81 mL/min/{1.73_m2} (ref >59)

## 2020-12-14 MED ORDER — METOPROLOL SUCCINATE ER 50 MG PO TB24: 50.0000 mg | ORAL_TABLET | Freq: Every day | ORAL | 3 refills | Status: DC

## 2020-12-14 MED ORDER — ENTRESTO 49-51 MG PO TABS: 1.0000 | ORAL_TABLET | Freq: Two times a day (BID) | ORAL | 11 refills | Status: DC

## 2020-12-14 NOTE — Telephone Encounter (Signed)
BMP stable. Will increase Entresto to 49/'51mg'$  BID. Follow up in 2 weeks in office. Pt agreeable with plan.

## 2020-12-14 NOTE — Progress Notes (Signed)
Virtual Visit via Video Note  I connected with Destiny Beasley on 12/14/20 at  9:00 AM EDT by a video enabled telemedicine application and verified that I am speaking with the correct person using two identifiers.  At orientation to the IOP program, Case Manager discussed the limitations of evaluation and management by telemedicine and the availability of in person appointments. The patient expressed understanding and agreed to proceed with virtual visits throughout the duration of the program.   Location:  Patient: Patient Home Provider: Home Office   History of Present Illness: GAD  Observations/Objective: Check In: Case Manager checked in with all participants to review discharge dates, insurance authorizations, work-related documents and needs from the treatment team regarding medications. Client stated needs and engaged in discussion.   Initial Therapeutic Activity: Counselor facilitated a check-in with group members to assess mood and current functioning. Client shared details of their mental health management since our last session, including challenges and successes. Counselor engaged group in discussion, covering the following topics: [employer], Destiny Beasley, boundaries, assertive communication, relational dynamics and traumas and self-care. Client presents with moderate depression and moderate anxiety. Client denied any current SI/HI/psychosis.  Second Therapeutic Activity: Destiny Beasley: Counselor introduced [employer], [place] Chaplain to present information and discussion on Grief and Loss. Group members engaged in discussion, sharing how grief impacts them, what comforts them, what emotions are felt, labeling losses, etc. After guest speaker logged off, Counselor prompted group to spend 10-15 minutes journaling to process personal grief and loss situations. Counselor processed entries with group and client's identified areas for additional processing in individual therapy.   Check  Out: Counselor prompted group members to identify one self-care practice or productivity activity they would like to engage in this today. Counselor encouraged Client to be kind and compassionate with self, as grief and loss usually will bring up residual thoughts and feelings. Client endorsed safety plan to be followed to prevent safety issues.  Assessment and Plan: Clinician recommends that Client remain in IOP treatment to better manage mental health symptoms, stabilization and to address treatment plan goals. Clinician recommends adherence to crisis/safety plan, taking medications as prescribed, and following up with medical professionals if any issues arise.    Follow Up Instructions: Clinician will send Webex link for next session. The Client was advised to call back or seek an in-person evaluation if the symptoms worsen or if the condition fails to improve as anticipated.     I provided 180 minutes of non-face-to-face time during this encounter.     Lise Auer, LCSW

## 2020-12-14 NOTE — Progress Notes (Signed)
Patient ID: [name]                 DOB: 1956/01/20                      MRN: [id number]       HPI: Destiny Beasley is a 65 y.o. female patient of Destiny Beasley referred by Destiny Dopp, Destiny Beasley to HTN clinic. PMH is significant for CAD s/p STEMI in [id number], systolic CHF w/ last EF 99991111 recovered to 40-45%, T2DM, prior TIA, HTN and HLD. Pt has previously followed with PharmD team for cholesterol management for which she currently takes Praluent injections.    She was last seen in HTN clinic on 08/25/20. Blood pressure at home was better controlled than in the clinic. She has a history of multiple drug intolerances and self adjusting medications. Since being seen in the HTN in May she has been seen by Destiny Beasley on 5/11 where she complained of weight gain and SOB. Furosemide '20mg'$  x3 days then prn was prescribed. She reported high blood pressues (190/94, 175/101) via mychart on 6/5. She was advised to rest longer and recheck blood pressure. She was also advised to make sure she is taking carvedilol twice a day. On follow up with Destiny Beasley on 6/17 she was still not taking carvedilol twice a day. Stated that if she took it twice a day her hot flashes were worse. Carvedilol was switched to metoprolol succinate. While she was checking out of that visit, she developed severe chest pain, shortness of breath and nausea as she was being checked out.  Her pressure went up significantly.  She was shivering in the room.  Her electrocardiogram did not demonstrate anything acute.  EMS was contacted to transfer her to the emergency room quickly. Thought to be an anxiety attack and not cardiac. Also seen in ER on 6/28 for COVID and anxiety attack.   She was agreeable to re-trying Entresto. She refused to start the middle dose, therefore she was started on Entresto 24/'26mg'$  BID. Hydralazine '10mg'$  BID was started on 7/11 and was increased to '20mg'$  TID on 7/15. She was seen again for anxiety in the ER on 7/18. Now being seen by [facility].  Patient presents today to PharmD clinic. She states that she thinks the [name] is really helping her blood pressure. She is still on the 24/'26mg'$  dose. She brings in her home wrist cuff. Gives me a few readings of 127/76, 137/82. Unfortunately, when compared to the clinic cuff her wrist cuff was running about 20 points lower for systolic but about 20 points higher for diastolic. Home cuff 160/109 vs clinic reading 182/90.   She states that she will wake up a feel really tired. She also complains of tingling in left arm that comes and goes, pain in her leg and headache on one side. Denies slurred speech or trouble finding words. Thinks its from the [place], but is happy with the affect on her blood pressure so she wants to continue. She is not taking hydralazine. She states that "she stopped it", although there is no documentation of this, only documentation that we told her to increase to '20mg'$  TID. States she is taking metoprolol succinate '50mg'$  daily.  Complains of some SOB, not checking her weight at home. Has not tried taking any furosemide. Weight in clinic today is the same as on 8/12.  Current HTN meds: metoprolol '50mg'$  succinate daily, Entresto 24/'26mg'$  twice a day  Previously tried: spironolactone 25 mg daily (wheezing vs. fatigue), hydralazine 25, 50 mg TID (fatigue), Imdur 30 mg daily (wheezing), Entresto (?), Invokana (frequent yeast infections), carvedilol '25mg'$  BID (wheezing), amlodipine 10 mg daily (makes her muscles hurt), chlorthalidone 12.'5mg'$  (leg pain?), Jardiance (yeast infections) BP goal: <130/80 mmHg   Family History: The patient's family history includes Diabetes in her mother; Hyperlipidemia in her mother; Hypertension in her mother.   Social History: never smoked, + ETOH   Diet: drinks: water, coffee (2 cups), Gatorade zero Fresh or frozen vegetables  Exercise: 20 min walking on treadmill 5 times a week   Home BP readings: 127/76, 137/82  Wt Readings  from Last 3 Encounters:  12/03/20 268 lb (121.6 kg)  11/01/20 265 lb (120.2 kg)  10/08/20 270 lb 15.1 oz (122.9 kg)   BP Readings from Last 3 Encounters:  12/03/20 (!) 172/102  11/08/20 (!) 160/101  11/01/20 (!) 160/80   Pulse Readings from Last 3 Encounters:  12/03/20 89  11/08/20 76  11/01/20 (!) 106    Renal function: Estimated Creatinine Clearance: 92.6 mL/min (by C-G formula based on SCr of 0.92 mg/dL).  Past Medical History:  Diagnosis Date   Anemia    Anxiety    CAD in native artery    a. Inf STEMI 123XX123 complicated by cardiogenic shock/complete heart block s/p emergent cath showing culprit large dominant LCx s/p aspiration thrombectomy and DES, EF 50% by cath, 40-45% by echo.   Chronic systolic heart failure ([place]) 06/25/2017   Echo 11/19: mild LVH, EF 40-45, inf-lat HK, Gr 1 DD, trivial MR   Complete heart block, transient ([place])    a. 06/2017 in setting of acute inf MI -> resolved after PCI.   COVID-19    Diabetes mellitus    Goiter    History of radiation therapy 06/14/2016 - 07/19/2016   Right Breast 50 Gy 25 fractions   Hypercholesteremia    Hypertension    Ischemic cardiomyopathy    a. EF 50% by cath and 40-45% by echo 06/2017.   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   Meningioma [facility])    Nuclear stress test    Nuclear stress test 10/19: EF 35, inferolateral, apical scar; inferior, apical inferior infarct with mild peri-infarct ischemia; high risk   Obesity    Personal history of chemotherapy    Personal history of radiation therapy    Pituitary tumor    Seasonal asthma    Sleep apnea    does not use every night   Stroke [facility])    TIA - Dec 1 st, 2017    Current Outpatient Medications on File Prior to Visit  Medication Sig Dispense Refill   Alirocumab (PRALUENT) 75 MG/ML SOAJ Inject 1 mL into the skin every 14 (fourteen) days. 6 mL 1   ALPRAZOLAM PO Take  12.5 mg by mouth 2 (two) times daily as needed (anxiety attack).     aspirin EC 81 MG tablet Take 1 tablet (81 mg total) by mouth daily. 90 tablet 3   Coenzyme Q10 100 MG capsule Take 100 mg by mouth daily.     Continuous Blood Gluc Receiver (FREESTYLE LIBRE 2 READER) DEVI Use reader to monitor blood sugars continuously. 1 each 0   Continuous Blood Gluc Sensor (FREESTYLE LIBRE 2 SENSOR) MISC USE 1 SENSOR ONCE EVERY 14  DAYS TO MONITOR BLOOD  SUGARS CONTINUOUSLY 6 each  1   furosemide (LASIX) 20 MG tablet Take 1 tablet (20 mg total) by mouth daily. For 3 days then take as need or fluid and swelling 90 tablet 1   glucose blood (FREESTYLE TEST STRIPS) test strip Use as instructed 100 each 12   HUMALOG 100 UNIT/ML cartridge INECT 3-25 UNITS INTO THE SKIN THREE TIMES DAILY WITH MEALS. USE PER SLIDING SCALE 30 mL 3   hydrALAZINE (APRESOLINE) 10 MG tablet Take 2 tablets (20 mg total) by mouth 3 (three) times daily. 60 tablet 3   hydrOXYzine (ATARAX/VISTARIL) 25 MG tablet Take 1 tablet (25 mg total) by mouth 3 (three) times daily as needed for anxiety. 30 tablet 0   INPEN 100-BLUE-LILLY DEVI CONSULT MD FOR APP SETTINGS. 1 each 0   LANTUS SOLOSTAR 100 UNIT/ML Solostar Pen INJECT SUBCUTANEOUSLY 70  UNITS DAILY 75 mL 3   liraglutide (VICTOZA) 18 MG/3ML SOPN 1.2 mg for 7 days and then 1.8 mg daily 6 mL 2   metoprolol succinate (TOPROL-XL) 25 MG 24 hr tablet Take 1 tablet (25 mg total) by mouth daily. Take with or immediately following a meal. 90 tablet 1   nitroGLYCERIN (NITROSTAT) 0.4 MG SL tablet DISSOLVE ONE TABLET UNDER TONGUE AS NEEDED FOR CHEST PAIN 25 tablet 4   Omega-3 Fatty Acids (FISH OIL OMEGA-3 PO) Take 1 tablet by mouth daily.     sacubitril-valsartan (ENTRESTO) 24-26 MG Take 1 tablet by mouth 2 (two) times daily. 60 tablet 11   Semaglutide,0.25 or 0.'5MG'$ /DOS, (OZEMPIC, 0.25 OR 0.5 MG/DOSE,) 2 MG/1.5ML SOPN Inject 0.5 mg into the skin once a week. 4.5 mL 2   valACYclovir (VALTREX) 1000 MG tablet  Take 1,000 mg by mouth 2 (two) times daily as needed (infection).     venlafaxine (EFFEXOR) 75 MG tablet Take 1 tablet (75 mg total) by mouth 2 (two) times daily. 60 tablet 0   No current facility-administered medications on file prior to visit.    Allergies  Allergen Reactions   Hydrocodone Other (See Comments)    Severe anxiety Confusion    Invokana [Canagliflozin] Other (See Comments)    Constant yeast infections   Amlodipine Other (See Comments)    Muscles hurt   Carvedilol Other (See Comments)    Wheezing with '25mg'$  BID dosing NOT ALLERGIC 10/08/20   Chlorthalidone Other (See Comments)    Leg pain   Evolocumab Other (See Comments)    Injection site pain   Hydralazine Other (See Comments)    Fatigue   Imdur [Isosorbide Nitrate] Other (See Comments)    Patient reported wheezing   Jardiance [Empagliflozin] Other (See Comments)    lightheadedness, weakness, low BP and yeast infection.  NOT ALLERGIC 10/08/20   Rosuvastatin Other (See Comments)    myalgias   Simvastatin Other (See Comments)    myalgias   Spironolactone Other (See Comments)    Wheezing vs fatigue?   Atorvastatin Other (See Comments)    DIZZINESS   Compazine [Prochlorperazine] Anxiety    There were no vitals taken for this visit.   Assessment/Plan:  1. Hypertension/CHF - Blood pressure is above goal of <130/80 in clinic today. Home readings look good, but her cuff is inaccurate. She states she has an upper arm cuff at home too. I have asked her to check using this cuff. Bring in upper arm cuff to next visit. She most likely has some form of white coat syndrome. So I believe her BPs at home are better, but still not well controlled. Will check  BMP today. If stable, will plan to increase Entresto to 49/'51mg'$  BID. Follow up visit to be scheduled once labs result. If for some reason we cannot increase Entresto, then will need to add back hydralazine.  2. SOB- I have advised patient to take 1 dose of furosemide to  see if it helps with her SOB. I also encouraged her to weight herself daily and record her weights. Call us with a weight gain of 3 or more lb overnight or 5lb in a week.   Thank you,  Ramond Dial, Pharm.D, BCPS, CPP [place]  [id number], [place], [place] [address]  Phone: [phone]; Fax: [phone]

## 2020-12-14 NOTE — Patient Instructions (Addendum)
Please start using your upper arm cuff to check your blood pressure Make sure you are seated and rested for a good 5-10 min before checking blood pressure.  Please bring in your blood pressure machine to next visit  I will call you with your lab results and go over any changes needed  Continue metoprolol '50mg'$  succinate daily and Entresto 24/'26mg'$  twice a day for now  Take a dose of furosemide '20mg'$  once.

## 2020-12-15 ENCOUNTER — Other Ambulatory Visit (HOSPITAL_COMMUNITY): Payer: 59 | Admitting: Psychiatry

## 2020-12-15 DIAGNOSIS — F411 Generalized anxiety disorder: Secondary | ICD-10-CM | POA: Diagnosis not present

## 2020-12-15 DIAGNOSIS — F410 Panic disorder [episodic paroxysmal anxiety] without agoraphobia: Secondary | ICD-10-CM

## 2020-12-16 ENCOUNTER — Encounter (HOSPITAL_COMMUNITY): Payer: Self-pay | Admitting: Licensed Clinical Social Worker

## 2020-12-16 ENCOUNTER — Encounter (HOSPITAL_COMMUNITY): Payer: Self-pay | Admitting: Psychiatry

## 2020-12-16 ENCOUNTER — Other Ambulatory Visit: Payer: Self-pay

## 2020-12-16 ENCOUNTER — Other Ambulatory Visit (HOSPITAL_COMMUNITY): Payer: 59 | Admitting: Psychiatry

## 2020-12-16 ENCOUNTER — Encounter (HOSPITAL_COMMUNITY): Payer: Self-pay

## 2020-12-16 DIAGNOSIS — F410 Panic disorder [episodic paroxysmal anxiety] without agoraphobia: Secondary | ICD-10-CM

## 2020-12-16 DIAGNOSIS — F411 Generalized anxiety disorder: Secondary | ICD-10-CM | POA: Diagnosis not present

## 2020-12-16 NOTE — Progress Notes (Signed)
Virtual Visit via Video Note  I connected with Destiny Beasley on 12/15/20 at  9:00 AM EDT by a video enabled telemedicine application and verified that I am speaking with the correct person using two identifiers.  At orientation to the IOP program, Case Manager discussed the limitations of evaluation and management by telemedicine and the availability of in person appointments. The patient expressed understanding and agreed to proceed with virtual visits throughout the duration of the program.   Location:  Patient: Patient Home Provider: Home Office   History of Present Illness: GAD  Observations/Objective: Check In: Case Manager checked in with all participants to review discharge dates, insurance authorizations, work-related documents and needs from the treatment team regarding medications. Client stated needs and engaged in discussion. Case Manager introduced a new Client to the group, with group members welcoming and starting the joining process.   Initial Therapeutic Activity: Counselor facilitated a check-in with group members to assess mood and current functioning. Client shared details of their mental health management since our last session, including challenges and successes. Counselor engaged group in discussion, covering the following topics: daily management of MH and local resources. Client presents with moderate depression and moderate anxiety. Client denied any current SI/HI/psychosis.  Second Therapeutic Activity: Counselor introduced our guest speaker, Einar Grad, [employer] Pharmacist, who shared about psychiatric medications, side effects, treatment considerations and how to communicate with medical professionals. Group Members asked questions and shared medication concerns. Counselor prompted group members to reference a worksheet called, "Body Scan" to jot down questions and concerns about their physical health in preparation for their upcoming appointments with medical  professionals. Client is interested in a referral from PCP for weight management, reporting anxiety causes stomach issues. Counselor encouraged routine medical check-ups, preparing for appointments, following up with recommendations and seeking specialist if needed.   Check Out: Counselor closed program by allowing time to celebrate a graduating group member. Counselor shared reflections on progress and allow space for group members to share well wishes and encouragements with the graduating client. Counselor prompted graduating client to share takeaways, reflect on progress and final thoughts for the group. Counselor prompted group members to identify one self-care practice or productivity activity they would like to engage in this today. Client endorsed safety plan to be followed to prevent safety issues.  Assessment and Plan: Clinician recommends that Client remain in IOP treatment to better manage mental health symptoms, stabilization and to address treatment plan goals. Clinician recommends adherence to crisis/safety plan, taking medications as prescribed, and following up with medical professionals if any issues arise.    Follow Up Instructions: Clinician will send Webex link for next session. The Client was advised to call back or seek an in-person evaluation if the symptoms worsen or if the condition fails to improve as anticipated.     I provided 180 minutes of non-face-to-face time during this encounter.     Lise Auer, LCSW

## 2020-12-16 NOTE — Progress Notes (Signed)
Virtual Visit via Video Note  I connected with Destiny Beasley on 12/16/20 at  9:00 AM EDT by a video enabled telemedicine application and verified that I am speaking with the correct person using two identifiers.  At orientation to the IOP program, Case Manager discussed the limitations of evaluation and management by telemedicine and the availability of in person appointments. The patient expressed understanding and agreed to proceed with virtual visits throughout the duration of the program.   Location:  Patient: Patient Home Provider: Home Office   History of Present Illness: GAD  Observations/Objective: Check In: Case Manager checked in with all participants to review discharge dates, insurance authorizations, work-related documents and needs from the treatment team regarding medications. Client stated needs and engaged in discussion. Case Manager introduced a new Client to the group, with group members welcoming and starting the joining process.   Initial Therapeutic Activity: Counselor facilitated a check-in with group members to assess mood and current functioning. Client shared details of their mental health management since our last session, including challenges and successes. Counselor engaged group in discussion, covering the following topics: boundary setting, preparing for return to work, [employer] for mental health in various environments, and engaging support systems. Client presents with moderate depression and moderate anxiety. Client denied any current SI/HI/psychosis.  Second Therapeutic Activity: Counselor presented 5 alternative breathing techniques to traditional deep breathing. Counselor walked group members through each exercise, with group members sharing feedback on short term results. Client noted that she felt more relaxed and enjoyed learning new techniques. Counselor engaged group members in a guided imagery called, Relaxation for Self-esteem. The practice  incorporated positive affirmations, deep breathing, muscle relaxation and visualizations. Client remarked that it was relaxing and energizing, igniting inner confidence.   Check Out: Counselor prompted group members to identify one self-care practice or productivity activity they would like to engage in this today. Client plans to spend time with a friend. Client endorsed safety plan to be followed to prevent safety issues.  Assessment and Plan: Clinician recommends that Client remain in IOP treatment to better manage mental health symptoms, stabilization and to address treatment plan goals. Clinician recommends adherence to crisis/safety plan, taking medications as prescribed, and following up with medical professionals if any issues arise.    Follow Up Instructions: Clinician will send Webex link for next session. The Client was advised to call back or seek an in-person evaluation if the symptoms worsen or if the condition fails to improve as anticipated.     I provided 180 minutes of non-face-to-face time during this encounter.     Lise Auer, LCSW

## 2020-12-16 NOTE — Progress Notes (Signed)
Virtual Visit via Video Note  I connected with [name] on 12/10/2021 at  9:00 AM EDT by a video enabled telemedicine application and verified that I am speaking with the correct person using two identifiers.  Location: Patient: Home Provider: Office   I discussed the limitations of evaluation and management by telemedicine and the availability of in person appointments. The patient expressed understanding and agreed to proceed.  I discussed the assessment and treatment plan with the patient. The patient was provided an opportunity to ask questions and all were answered. The patient agreed with the plan and demonstrated an understanding of the instructions.   The patient was advised to call back or seek an in-person evaluation if the symptoms worsen or if the condition fails to improve as anticipated. I provided 15 minutes of non-face-to-face time during this encounter.   Derrill Center, NP    Psychiatric Initial Adult Assessment   Patient Identification: Destiny Beasley MRN:  [id number] Date of Evaluation:  12/16/2020 Referral Source: PHP Chief Complaint:    Anxiety/panic attack Visit Diagnosis:    ICD-10-CM   1. Panic attack  F41.0       History of Present Illness: Per initial assessment note:Destiny Beasley is a 65 y.o. African American female presents with generalized anxiety disorder and panic attacks.  Reports a history of depression and anxiety.  States more recently panic attacks have been intense.  Reports ongoing racing thoughts and sense of impending doom.  States she has been prescribed Effexor which was helpful however she felt as if the medication had stopped working.  States she has been taking this medication since she resided in [place].states her husband passed 20 years prior.  No change in routine, did report her primary care provider started her on Zoloft medication. " This wasn't a good fit for me." Chart review patient has hypothyroid disorder.  States she  continues to be followed by endocrinology for management.  Denied previous inpatient admissions.  Denied previous suicide attempts.  Reports overall "I had a good stress-free life" patient denied illicit drug use or substance abuse history.  Evaluation at discharge: Overall she reports her mood has improved.  She continues to deny suicidal or homicidal ideations.  Denies auditory or visual hallucinations.  Patient reports using Xanax sporadically as prescribed by her primary care provider.  Changed Effexor XL 150 mg to Effexor.  She reports taking and tolerating medications well.  Patient to transition to intensive outpatient programming on 12/10/2020  Associated Signs/Symptoms: Depression Symptoms:  fatigue, anxiety, (Hypo) Manic Symptoms:  Distractibility, Impulsivity, Irritable Mood, Anxiety Symptoms:  Excessive Worry, Social Anxiety, Psychotic Symptoms:   n/a PTSD Symptoms: NA  Past Psychiatric History:   Previous Psychotropic Medications: No   Substance Abuse History in the last 12 months:  No.  Consequences of Substance Abuse: NA  Past Medical History:  Past Medical History:  Diagnosis Date   Anemia    Anxiety    CAD in native artery    a. Inf STEMI [id number] complicated by cardiogenic shock/complete heart block s/p emergent cath showing culprit large dominant LCx s/p aspiration thrombectomy and DES, EF 50% by cath, 40-45% by echo.   Chronic systolic heart failure ([place]) 06/25/2017   Echo 11/19: mild LVH, EF 40-45, inf-lat HK, Gr 1 DD, trivial MR   Complete heart block, transient ([place])    a. 06/2017 in setting of acute inf MI -> resolved after PCI.   COVID-19    Diabetes mellitus  Goiter    History of radiation therapy 06/14/2016 - 07/19/2016   Right Breast 50 Gy 25 fractions   Hypercholesteremia    Hypertension    Ischemic cardiomyopathy    a. EF 50% by cath and 40-45% by echo 06/2017.   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   Meningi[facility])    Nuclear stress test    Nuclear stress test 10/19: EF 35, inferolateral, apical scar; inferior, apical inferior infarct with mild peri-infarct ischemia; high risk   Obesity    Personal history of chemotherapy    Personal history of radiation therapy    Pituitary tumor    Seasonal asthma    Sleep apnea    does not use every night   [facility])    TIA - Dec 1 st, 2017    Past Surgical History:  Procedure Laterality Date   ABDOMINAL HYSTERECTOMY     BRAIN MENINGIOMA EXCISION  2005   BREAST BIOPSY     BREAST LUMPECTOMY Right    2017   BREAST LUMPECTOMY WITH RADIOACTIVE SEED LOCALIZATION Right 04/20/2016   Procedure: RIGHT BREAST LUMPECTOMY WITH RADIOACTIVE SEED LOCALIZATION;  Surgeon: Fanny Skates, MD;  Location: [facility];  Service: General;  Laterality: Right;   BREAST REDUCTION SURGERY Bilateral 10/23/2016   Procedure: BILATERAL BREAST REDUCTION WITH LIPOSUCTION ASSISTANCE;  Surgeon: Cristine Polio, MD;  Location: [facility];  Service: Plastics;  Laterality: Bilateral;   CESAREAN SECTION     CORONARY STENT INTERVENTION N/A 06/25/2017   Procedure: CORONARY STENT INTERVENTION;  Surgeon: Sherren Mocha, MD;  Location: [place] CV LAB;  Service: Cardiovascular;  Laterality: N/A;   CORONARY/GRAFT ACUTE MI REVASCULARIZATION N/A 06/25/2017   Procedure: Coronary/Graft Acute MI Revascularization;  Surgeon: Sherren Mocha, MD;  Location: [place] [facility] LAB;  Service: Cardiovascular;  Laterality: N/A;   FOOT SURGERY Bilateral 2016   hammer toe surgery   LEFT HEART CATH AND CORONARY ANGIOGRAPHY N/A 06/25/2017   Procedure: LEFT HEART CATH AND CORONARY ANGIOGRAPHY;  Surgeon: Sherren Mocha, MD;  Location: [place] [facility];  Service: Cardiovascular;  Laterality: N/A;   LEFT HEART CATH AND CORONARY ANGIOGRAPHY N/A 05/13/2020   Procedure: LEFT HEART CATH AND CORONARY ANGIOGRAPHY;   Surgeon: Lorretta Harp, MD;  Location: [place] [facility];  Service: Cardiovascular;  Laterality: N/A;   MENISCUS REPAIR Left 2015   PITUITARY SURGERY     REDUCTION MAMMAPLASTY     RIGHT HEART CATH N/A 06/25/2017   Procedure: RIGHT HEART CATH;  Surgeon: Sherren Mocha, MD;  Location: [place] [facility];  Service: Cardiovascular;  Laterality: N/A;   THYROID SURGERY      Family Psychiatric History:   Family History:  Family History  Problem Relation Age of Onset   Diabetes Mother    Hyperlipidemia Mother    Hypertension Mother    Alcohol abuse Brother     Social History:   Social History   Socioeconomic History   Marital status: Single    Spouse name: Not on file   Number of children: Not on file   Years of education: Not on file   Highest education level: Bachelor's degree (e.g., BA, AB, BS)  Occupational History   Occupation: full time  Tobacco Use   Smoking status: Never   Smokeless tobacco: Never  Vaping Use   Vaping Use: Never used  Substance and Sexual  Activity   Alcohol use: Yes    Alcohol/week: 1.0 standard drink    Types: 1 Glasses of wine per week    Comment: 1 glass monthly   Drug use: No   Sexual activity: Not on file    Comment: no cycles; pt had hyst  Other Topics Concern   Not on file  Social History Narrative   Lives alone   Right Handed   Drinks 1-2 cups caffeine daily   Social Determinants of Health   Financial Resource Strain: Not on file  Food Insecurity: Not on file  Transportation Needs: Not on file  Physical Activity: Not on file  Stress: Not on file  Social Connections: Not on file    Additional Social History:   Allergies:   Allergies  Allergen Reactions   Hydrocodone Other (See Comments)    Severe anxiety Confusion    Invokana [Canagliflozin] Other (See Comments)    Constant yeast infections   Amlodipine Other (See Comments)    Muscles hurt   Carvedilol Other (See Comments)    Wheezing with '25mg'$  BID dosing NOT  ALLERGIC 10/08/20   Chlorthalidone Other (See Comments)    Leg pain   Evolocumab Other (See Comments)    Injection site pain   Hydralazine Other (See Comments)    Fatigue   Imdur [Isosorbide Nitrate] Other (See Comments)    Patient reported wheezing   Jardiance [Empagliflozin] Other (See Comments)    lightheadedness, weakness, low BP and yeast infection.  NOT ALLERGIC 10/08/20   Rosuvastatin Other (See Comments)    myalgias   Simvastatin Other (See Comments)    myalgias   Spironolactone Other (See Comments)    Wheezing vs fatigue?   Atorvastatin Other (See Comments)    DIZZINESS   Compazine [Prochlorperazine] Anxiety    Metabolic Disorder Labs: Lab Results  Component Value Date   HGBA1C 9.3 (H) 11/30/2020   MPG 191.51 05/12/2020   MPG 194.38 09/04/2018   No results found for: PROLACTIN Lab Results  Component Value Date   CHOL 120 10/06/2020   TRIG 63 10/06/2020   HDL 54 10/06/2020   CHOLHDL 2.2 10/06/2020   VLDL 19.2 08/12/2018   LDLCALC 52 10/06/2020   LDLCALC 51 07/26/2020   Lab Results  Component Value Date   TSH 3.57 11/30/2020    Therapeutic Level Labs: No results found for: LITHIUM No results found for: CBMZ No results found for: VALPROATE  Current Medications: Current Outpatient Medications  Medication Sig Dispense Refill   Alirocumab (PRALUENT) 75 MG/ML SOAJ Inject 1 mL into the skin every 14 (fourteen) days. 6 mL 1   ALPRAZOLAM PO Take 12.5 mg by mouth 2 (two) times daily as needed (anxiety attack).     aspirin EC 81 MG tablet Take 1 tablet (81 mg total) by mouth daily. 90 tablet 3   Coenzyme Q10 100 MG capsule Take 100 mg by mouth daily.     Continuous Blood Gluc Receiver (FREESTYLE LIBRE 2 READER) DEVI Use reader to monitor blood sugars continuously. 1 each 0   Continuous Blood Gluc Sensor (FREESTYLE LIBRE 2 SENSOR) MISC USE 1 SENSOR ONCE EVERY 14  DAYS TO MONITOR BLOOD  SUGARS CONTINUOUSLY 6 each 1   furosemide (LASIX) 20 MG tablet Take 1 tablet  (20 mg total) by mouth daily. For 3 days then take as need or fluid and swelling 90 tablet 1   glucose blood (FREESTYLE TEST STRIPS) test strip Use as instructed 100 each 12   HUMALOG 100  UNIT/ML cartridge INECT 3-25 UNITS INTO THE SKIN THREE TIMES DAILY WITH MEALS. USE PER SLIDING SCALE 30 mL 3   hydrOXYzine (ATARAX/VISTARIL) 25 MG tablet Take 1 tablet (25 mg total) by mouth 3 (three) times daily as needed for anxiety. 30 tablet 0   INPEN 100-BLUE-LILLY DEVI CONSULT MD FOR APP SETTINGS. 1 each 0   LANTUS SOLOSTAR 100 UNIT/ML Solostar Pen INJECT SUBCUTANEOUSLY 70  UNITS DAILY 75 mL 3   liraglutide (VICTOZA) 18 MG/3ML SOPN 1.2 mg for 7 days and then 1.8 mg daily 6 mL 2   metoprolol succinate (TOPROL-XL) 50 MG 24 hr tablet Take 1 tablet (50 mg total) by mouth daily. Take with or immediately following a meal. 90 tablet 3   nitroGLYCERIN (NITROSTAT) 0.4 MG SL tablet DISSOLVE ONE TABLET UNDER TONGUE AS NEEDED FOR CHEST PAIN 25 tablet 4   Omega-3 Fatty Acids (FISH OIL OMEGA-3 PO) Take 1 tablet by mouth daily.     sacubitril-valsartan (ENTRESTO) 49-51 MG Take 1 tablet by mouth 2 (two) times daily. 60 tablet 11   Semaglutide,0.25 or 0.'5MG'$ /DOS, (OZEMPIC, 0.25 OR 0.5 MG/DOSE,) 2 MG/1.5ML SOPN Inject 0.5 mg into the skin once a week. 4.5 mL 2   valACYclovir (VALTREX) 1000 MG tablet Take 1,000 mg by mouth 2 (two) times daily as needed (infection).     venlafaxine (EFFEXOR) 75 MG tablet Take 1 tablet (75 mg total) by mouth 2 (two) times daily. 60 tablet 0   No current facility-administered medications for this visit.    Musculoskeletal: Strength & Muscle Tone: within normal limits Gait & Station: normal Patient leans: N/A  Psychiatric Specialty Exam: Review of Systems  There were no vitals taken for this visit.There is no height or weight on file to calculate BMI.  General Appearance: Casual  Eye Contact:  Good  Speech:  Clear and Coherent  Volume:  Normal  Mood:  Anxious and Depressed  Affect:   Congruent  Thought Process:  Coherent  Orientation:  Full (Time, Place, and Person)  Thought Content:  Logical  Suicidal Thoughts:  No  Homicidal Thoughts:  No  Memory:  Immediate;   Good Recent;   Good  Judgement:  Good  Insight:  Good  Psychomotor Activity:  Normal  Concentration:  Concentration: Good  Recall:  Good  Fund of Knowledge:Good  Language: Good  Akathisia:  No  Handed:  Right  AIMS (if indicated):  done  Assets:  Communication Skills Desire for Improvement Social Support  ADL's:  Intact  Cognition: WNL  Sleep:  Good   Screenings: PHQ2-9    Flowsheet Row Counselor from 11/29/2020 in [facility] Follow Up 20 from 08/23/2016 in [facility] from 05/19/2016 in [facility] from 03/15/2016 in [facility] [facility]  PHQ-2 Total Score 6 0 0 0  PHQ-9 Total Score 18 -- -- --      Flowsheet Row ED from 10/19/2020 in [facility] from 10/08/2020 in [facility] to Hosp-Admission (Discharged) from 05/12/2020 in [place] No Risk No Risk No Risk       Assessment and Plan:  Patient to start Intensive Outpatient programming (IOP)  Continue medications as indicated  Treatment plan was reviewed and agreed upon by NP T. Grahm Ets[facility] inpatient [facility]  need for continued group therapy for   [employer]  Bobby Rumpf, NP 8/25/20228:54 AM

## 2020-12-17 ENCOUNTER — Other Ambulatory Visit: Payer: Self-pay

## 2020-12-17 ENCOUNTER — Encounter (HOSPITAL_COMMUNITY): Payer: Self-pay

## 2020-12-17 ENCOUNTER — Other Ambulatory Visit (HOSPITAL_COMMUNITY): Payer: 59 | Admitting: Psychiatry

## 2020-12-17 DIAGNOSIS — F411 Generalized anxiety disorder: Secondary | ICD-10-CM

## 2020-12-17 DIAGNOSIS — F410 Panic disorder [episodic paroxysmal anxiety] without agoraphobia: Secondary | ICD-10-CM

## 2020-12-17 NOTE — Progress Notes (Signed)
Virtual Visit via Video Note  I connected with Destiny Beasley on 12/17/20 at  9:00 AM EDT by a video enabled telemedicine application and verified that I am speaking with the correct person using two identifiers.  At orientation to the IOP program, Case Manager discussed the limitations of evaluation and management by telemedicine and the availability of in person appointments. The patient expressed understanding and agreed to proceed with virtual visits throughout the duration of the program.   Location:  Patient: Patient Home Provider: Home Office   History of Present Illness: GAD  Observations/Objective: Check In: Case Manager checked in with all participants to review discharge dates, insurance authorizations, work-related documents and needs from the treatment team regarding medications. Client stated needs and engaged in discussion.   Initial Therapeutic Activity: Counselor facilitated a check-in with group members to assess mood and current functioning. Client shared details of their mental health management since our last session, including challenges and successes. Counselor engaged group in discussion, covering the following topics: sleep issues and sleep hygiene, changing careers to support mental health, incorporating movement, socialization and medication concerns. Client presents with moderate depression and moderate anxiety. Client denied any current SI/HI/psychosis.  Second Therapeutic Activity: Counselor presented psychoeducation on the anxiety cycle and the depression cycle. Counselor prompted group members to compare and contrast their depressive symptoms. Group members shared their reflections, highlighting most impactful symptoms. Counselor discussed how to communicate about symptoms with providers and to use as signals to address mental health. Counselor shared a video on nighttime anxiety strategies. Client remarked that the information was helpful and engaged well in  exercise and discussion.   Check Out: Counselor closed program by allowing time to celebrate several graduating group members. Counselor shared reflections on progress and allow space for group members to share well wishes and encouragements with the graduating client. Counselor prompted graduating client to share takeaways, reflect on progress and final thoughts for the group. Client endorsed safety plan to be followed to prevent safety issues.  Assessment and Plan: Clinician recommends that Client remain in IOP treatment to better manage mental health symptoms, stabilization and to address treatment plan goals. Clinician recommends adherence to crisis/safety plan, taking medications as prescribed, and following up with medical professionals if any issues arise.    Follow Up Instructions: Clinician will send Webex link for next session. The Client was advised to call back or seek an in-person evaluation if the symptoms worsen or if the condition fails to improve as anticipated.     I provided 180 minutes of non-face-to-face time during this encounter.     Lise Auer, LCSW

## 2020-12-20 ENCOUNTER — Encounter (HOSPITAL_COMMUNITY): Payer: Self-pay

## 2020-12-20 ENCOUNTER — Other Ambulatory Visit (HOSPITAL_COMMUNITY): Payer: 59 | Admitting: Psychiatry

## 2020-12-20 ENCOUNTER — Other Ambulatory Visit: Payer: Self-pay

## 2020-12-20 DIAGNOSIS — F410 Panic disorder [episodic paroxysmal anxiety] without agoraphobia: Secondary | ICD-10-CM

## 2020-12-20 DIAGNOSIS — F411 Generalized anxiety disorder: Secondary | ICD-10-CM | POA: Diagnosis not present

## 2020-12-20 NOTE — Progress Notes (Signed)
Virtual Visit via Video Note  I connected with Destiny Beasley on 12/20/20 at  9:00 AM EDT by a video enabled telemedicine application and verified that I am speaking with the correct person using two identifiers.  At orientation to the IOP program, Case Manager discussed the limitations of evaluation and management by telemedicine and the availability of in person appointments. The patient expressed understanding and agreed to proceed with virtual visits throughout the duration of the program.   Location:  Patient: Patient Home Provider: Home Office   History of Present Illness: GAD  Observations/Objective: Check In: Case Manager checked in with all participants to review discharge dates, insurance authorizations, work-related documents and needs from the treatment team regarding medications. Client stated needs and engaged in discussion. Case Manager introduced a new group member, with group welcoming and starting the joining process.  Initial Therapeutic Activity: Counselor facilitated a check-in with group members to assess mood and current functioning. Client shared details of their mental health management since our last session, including challenges and successes. Counselor engaged group in discussion, covering the following topics: celebrating successes, exploring new activities, volunteering, setting boundaries in the work place, and applying self-love practices. Client presents with mild depression and moderate anxiety. Client denied any current SI/HI/psychosis.  Second Therapeutic Activity: Counselor engaged group in an activity where we explored unrealistic expectations, associated with the cognitive distortion called "should-ing". Counselor provided psychoeducation on the concept and engaged group in discussion after they privately listed their unrealistic expectations. Group members were empathetic with each other remarks giving examples of how this distorted thinking impacts their  lives and mental health. Counselor shared 5 strategies highlighted in a therapeutic video on the topic and discussed on how to make practices applicable to each members lives. Client engaged well in activity and discussion.   Check Out: Counselor prompted group members to identify one self-care practice or productivity activity they would like to engage in today. Client plans to return part time to work, communicating boundaries and doing mental health check throughout the time. Client endorsed safety plan to be followed to prevent safety issues.  Assessment and Plan: Clinician recommends that Client remain in IOP treatment to better manage mental health symptoms, stabilization and to address treatment plan goals. Clinician recommends adherence to crisis/safety plan, taking medications as prescribed, and following up with medical professionals if any issues arise.    Follow Up Instructions: Clinician will send Webex link for next session. The Client was advised to call back or seek an in-person evaluation if the symptoms worsen or if the condition fails to improve as anticipated.     I provided 180 minutes of non-face-to-face time during this encounter.     Lise Auer, LCSW

## 2020-12-21 ENCOUNTER — Encounter (HOSPITAL_COMMUNITY): Payer: Self-pay

## 2020-12-21 ENCOUNTER — Other Ambulatory Visit (HOSPITAL_COMMUNITY): Payer: 59 | Admitting: Psychiatry

## 2020-12-21 ENCOUNTER — Other Ambulatory Visit: Payer: Self-pay

## 2020-12-21 DIAGNOSIS — F411 Generalized anxiety disorder: Secondary | ICD-10-CM | POA: Diagnosis not present

## 2020-12-21 DIAGNOSIS — F410 Panic disorder [episodic paroxysmal anxiety] without agoraphobia: Secondary | ICD-10-CM

## 2020-12-21 NOTE — Progress Notes (Signed)
Virtual Visit via Video Note  I connected with Destiny Beasley on 12/21/20 at  9:00 AM EDT by a video enabled telemedicine application and verified that I am speaking with the correct person using two identifiers.  At orientation to the IOP program, Case Manager discussed the limitations of evaluation and management by telemedicine and the availability of in person appointments. The patient expressed understanding and agreed to proceed with virtual visits throughout the duration of the program.   Location:  Patient: Patient Home Provider: Home Office   History of Present Illness: GAD  Observations/Objective: Check In: Case Manager checked in with all participants to review discharge dates, insurance authorizations, work-related documents and needs from the treatment team regarding medications. Client stated needs and engaged in discussion. Case Manager introduced a new group member, with group welcoming and starting the joining process.  Initial Therapeutic Activity: Counselor facilitated a check-in with group members to assess mood and current functioning. Client shared details of their mental health management since our last session, including challenges and successes. Counselor engaged group in discussion, covering the following topics: combatting anxiety, self-care practices, medication issues, and grounding practices-5 senses, scattered counting, backwards spelling, and mindful walking. Client presents with moderate depression and moderate anxiety. Client denied any current SI/HI/psychosis.  Second Therapeutic Activity: Counselor introduced [employer], [place] Chaplain to present information and discussion on Grief and Loss. Group members engaged in discussion, sharing how grief impacts them, what comforts them, what emotions are felt, labeling losses, etc. After guest speaker logged off, Counselor prompted group to spend 10-15 minutes journaling to process personal grief and loss  situations. Counselor processed entries with group and client's identified areas for additional processing in individual therapy. Client noted grief related to loss of self.  Check Out: Counselor prompted group members to identify one self-care practice or productivity activity they would like to engage in today. Client plans to get a massage. Client endorsed safety plan to be followed to prevent safety issues.  Assessment and Plan: Clinician recommends that Client remain in IOP treatment to better manage mental health symptoms, stabilization and to address treatment plan goals. Clinician recommends adherence to crisis/safety plan, taking medications as prescribed, and following up with medical professionals if any issues arise.    Follow Up Instructions: Clinician will send Webex link for next session. The Client was advised to call back or seek an in-person evaluation if the symptoms worsen or if the condition fails to improve as anticipated.     I provided 180 minutes of non-face-to-face time during this encounter.     Lise Auer, LCSW

## 2020-12-22 ENCOUNTER — Other Ambulatory Visit (HOSPITAL_COMMUNITY): Payer: 59 | Admitting: Family

## 2020-12-22 ENCOUNTER — Other Ambulatory Visit: Payer: Self-pay

## 2020-12-22 DIAGNOSIS — F411 Generalized anxiety disorder: Secondary | ICD-10-CM | POA: Diagnosis not present

## 2020-12-22 DIAGNOSIS — F410 Panic disorder [episodic paroxysmal anxiety] without agoraphobia: Secondary | ICD-10-CM

## 2020-12-23 ENCOUNTER — Encounter (HOSPITAL_COMMUNITY): Payer: Self-pay

## 2020-12-23 ENCOUNTER — Other Ambulatory Visit: Payer: Self-pay

## 2020-12-23 ENCOUNTER — Other Ambulatory Visit (HOSPITAL_COMMUNITY): Payer: 59 | Attending: Psychiatry | Admitting: Psychiatry

## 2020-12-23 DIAGNOSIS — F410 Panic disorder [episodic paroxysmal anxiety] without agoraphobia: Secondary | ICD-10-CM | POA: Diagnosis not present

## 2020-12-23 DIAGNOSIS — F411 Generalized anxiety disorder: Secondary | ICD-10-CM | POA: Diagnosis present

## 2020-12-23 NOTE — Patient Instructions (Signed)
D:  Patient will discharge on 12-24-20.  A:  Follow up with PCP (Dr. Harrington Challenger) and Hessie Diener (therapist).  Recommended support groups through [facility] [phone].  R:  Patient receptive.

## 2020-12-23 NOTE — Progress Notes (Signed)
Virtual Visit via Video Note  I connected with [name] on 12/23/20 at  9:00 AM EDT by a video enabled telemedicine application and verified that I am speaking with the correct person using two identifiers.  At orientation to the IOP program, Case Manager discussed the limitations of evaluation and management by telemedicine and the availability of in person appointments. The patient expressed understanding and agreed to proceed with virtual visits throughout the duration of the program.   Location:  Patient: Patient Home Provider: Home Office   History of Present Illness: GAD and Panic Attacks  Observations/Objective: Check In: Case Manager checked in with all participants to review discharge dates, insurance authorizations, work-related documents and needs from the treatment team regarding medications. Client stated needs and engaged in discussion.   Initial Therapeutic Activity: Counselor facilitated a check-in with group members to assess mood and current functioning. Client shared details of their mental health management since our last session, including challenges and successes. Counselor engaged group in discussion, covering the following topics: coping mechanisms that distract-coloring, journaling, gaming, word puzzles and how to properly utilize coping skills as to not become dependent on them Client presents with moderate depression and moderate anxiety. Client denied any current SI/HI/psychosis.  Second Therapeutic Activity: Counselor provided psychoeducation on Cognitive Distortions with the group. Counselor introduced terms and types utilizing a video from [employer]. Counselor then processed information with group members. Client identified that they related most with fallicy of fairness and shoulds styles of distorted thinking. Counselor discussed the importance of retaining the brain to calm emotional thinking to engage in logical, rational and more positive/kind thought  processes. Counselor provided a variety of worksheets targeting CBT interventions for group to do as homework.  Check Out: Counselor prompted group members to identify one self-care practice or productivity activity they would like to engage in today. Client endorsed safety plan to be followed to prevent safety issues.  Assessment and Plan: Clinician recommends that Client remain in IOP treatment to better manage mental health symptoms, stabilization and to address treatment plan goals. Clinician recommends adherence to crisis/safety plan, taking medications as prescribed, and following up with medical professionals if any issues arise.    Follow Up Instructions: Clinician will send Webex link for next session. The Client was advised to call back or seek an in-person evaluation if the symptoms worsen or if the condition fails to improve as anticipated.     I provided 180 minutes of non-face-to-face time during this encounter.     Lise Auer, LCSW

## 2020-12-23 NOTE — Progress Notes (Signed)
Virtual Visit via Video Note  I connected with Destiny Beasley on 12/22/20 at  9:00 AM EDT by a video enabled telemedicine application and verified that I am speaking with the correct person using two identifiers.  At orientation to the IOP program, Case Manager discussed the limitations of evaluation and management by telemedicine and the availability of in person appointments. The patient expressed understanding and agreed to proceed with virtual visits throughout the duration of the program.   Location:  Patient: Patient Home Provider: Home Office   History of Present Illness: GAD and Panic Attack  Observations/Objective: Check In: Case Manager checked in with all participants to review discharge dates, insurance authorizations, work-related documents and needs from the treatment team regarding medications. Client stated needs and engaged in discussion.   Initial Therapeutic Activity: Counselor facilitated a check-in with group members to assess mood and current functioning. Client shared details of their mental health management since our last session, including challenges and successes. Counselor engaged group in discussion, covering the following topics: medical issues that impact mental health, communication skills, boundary setting, finding the positives, grief and loss and using music as an outlet. Client presents with moderate depression and moderate anxiety. Client denied any current SI/HI/psychosis.  Second Therapeutic Activity: Counselor introduced an activity called the "Inside Out Mask". Counselor provided psychoeducation on the concept of "masking mental health". Group members shared about how they have engaged in this behavior in a variety of settings. Counselor prompted group members to create an image symbolizing what they portray on the outside to the world, vs what they are experiencing internally. Client shared about her internal emotions, pain and conflicts, and how on the  outside she strives for people to think that everything is ok, noting that it is hard to hold all emotions in at this point. Counselor provided psychoeducation about unmasking and getting help for internal emotional distress.   Check Out: Counselor prompted group members to identify one self-care practice or productivity activity they would like to engage in today. Client endorsed safety plan to be followed to prevent safety issues.  Assessment and Plan: Clinician recommends that Client remain in IOP treatment to better manage mental health symptoms, stabilization and to address treatment plan goals. Clinician recommends adherence to crisis/safety plan, taking medications as prescribed, and following up with medical professionals if any issues arise.    Follow Up Instructions: Clinician will send Webex link for next session. The Client was advised to call back or seek an in-person evaluation if the symptoms worsen or if the condition fails to improve as anticipated.     I provided 180 minutes of non-face-to-face time during this encounter.     Lise Auer, LCSW

## 2020-12-24 ENCOUNTER — Other Ambulatory Visit (HOSPITAL_COMMUNITY): Payer: 59 | Admitting: Psychiatry

## 2020-12-24 ENCOUNTER — Other Ambulatory Visit: Payer: Self-pay

## 2020-12-24 DIAGNOSIS — F410 Panic disorder [episodic paroxysmal anxiety] without agoraphobia: Secondary | ICD-10-CM

## 2020-12-24 DIAGNOSIS — F411 Generalized anxiety disorder: Secondary | ICD-10-CM | POA: Diagnosis not present

## 2020-12-24 NOTE — Progress Notes (Signed)
Virtual Visit via Video Note  I connected with Destiny Beasley on '@TODAY'$ @ at  9:00 AM EDT by a video enabled telemedicine application and verified that I am speaking with the correct person using two identifiers.  Location: Patient: at home Provider: at home office   I discussed the limitations of evaluation and management by telemedicine and the availability of in person appointments. The patient expressed understanding and agreed to proceed.   I discussed the assessment and treatment plan with the patient. The patient was provided an opportunity to ask questions and all were answered. The patient agreed with the plan and demonstrated an understanding of the instructions.   The patient was advised to call back or seek an in-person evaluation if the symptoms worsen or if the condition fails to improve as anticipated.  I provided 20 minutes of non-face-to-face time during this encounter.   Destiny Beasley, RITA, M.Ed,CNA Per [facility] on 11/24/20:  Pt presents as PHP referral from the [facility] ED after a panic attack. Pt reports history of anxiety most of her life, however over the past 4-5 months has been having panic attacks which are new for her. Pt states having panic attacks 3-4 days/week, and has multiple panic attacks a day on those days. Pt states these panic attacks have interfered with her work and day-to-day activities and "mess my nerves all up." Pt reports she is unaware of triggers for the panic attacks and the unpredictability has made her reticent to leave the house. Pt is currently followed by her PCP and was put on FMLA as of 11/11/20. Pt reports good support system, lives alone, and enjoys her work. Pt denies problematic substance use, AVH, and SI/HI.     Patient transitioned from [facility] to [facility] today.  Pt attended nine days in PHP. On a scale of 1-10 (10 being the worst); pt rates her depression at a 1 and anxiety at a 7.  Denies SI/HI or A/V hallucinations.    Pt completed all scheduled days.  Reports  decreased panic attacks.  Pt did c/o having white coat anxiety whenever she goes to the doctor now.  Pt started back to work this week; working 4 hrs a day.  States it's going well.  On a scale of 1-10 (10 being the worst) pt rates depression at a 0 and anxiety at a 3.  Denies SI/HI or A/V hallucinations.  "The groups were great." A:  D/C pt today.  F/U with PCP  and Destiny Beasley (therapist). Encouraged support groups through [facility].  R:  Pt receptive.  Dellia Nims, M.Ed,CNA

## 2020-12-24 NOTE — Progress Notes (Signed)
Virtual Visit via Telephone Note  I connected with Destiny Beasley on 12/24/20 at  9:00 AM EDT by telephone and verified that I am speaking with the correct person using two identifiers.  Location: Patient:  Provider: Office    I discussed the limitations, risks, security and privacy concerns of performing an evaluation and management service by telephone and the availability of in person appointments. I also discussed with the patient that there may be a patient responsible charge related to this service. The patient expressed understanding and agreed to proceed.   I discussed the assessment and treatment plan with the patient. The patient was provided an opportunity to ask questions and all were answered. The patient agreed with the plan and demonstrated an understanding of the instructions.   The patient was advised to call back or seek an in-person evaluation if the symptoms worsen or if the condition fails to improve as anticipated.  I provided 15 minutes of non-face-to-face time during this encounter.   Derrill Center, NP   [facility] Intensive Outpatient Program Discharge Summary  Destiny Beasley [id number]  Admission date:11/30/2020 Discharge date: 12/24/2020  Reason for admission: Destiny Beasley is a 65 y.o. African American female presents with generalized anxiety disorder and panic attacks.  Reports a history of depression and anxiety.  States more recently panic attacks have been intense.  Reports ongoing racing thoughts and sense of impending doom.  States she has been prescribed Effexor which was helpful however she felt as if the medication had stopped working.  States she has been taking this medication since she resided in [place].states her husband passed 20 years prior.  No change in routine, did report her primary care provider started her on Zoloft medication. " This wasn't a good fit for me." Chart review patient has hypothyroid disorder.  States she  continues to be followed by endocrinology for management.  Denied previous inpatient admissions.  Denied previous suicide attempts.  Reports overall "I had a good stress-free life" patient denied illicit drug use or substance abuse history. Patient was enrolled in partial psychiatric program on 11/30/20.   Family of Origin Issues:   Progress in Program Toward Treatment Goals:   Progress (rationale):   Take all medications as prescribed. Keep all follow-up appointments as scheduled.  Do not consume alcohol or use illegal drugs while on prescription medications. Report any adverse effects from your medications to your primary care provider promptly.  In the event of recurrent symptoms or worsening symptoms, call 911, a crisis hotline, or go to the nearest emergency department for evaluation.    Derrill Center, NP 12/24/2020

## 2020-12-28 ENCOUNTER — Other Ambulatory Visit: Payer: Self-pay

## 2020-12-28 ENCOUNTER — Ambulatory Visit (INDEPENDENT_AMBULATORY_CARE_PROVIDER_SITE_OTHER): Payer: 59 | Admitting: Pharmacist

## 2020-12-28 VITALS — BP 166/88 | HR 80 | Wt 267.0 lb

## 2020-12-28 DIAGNOSIS — I5022 Chronic systolic (congestive) heart failure: Secondary | ICD-10-CM

## 2020-12-28 DIAGNOSIS — I10 Essential (primary) hypertension: Secondary | ICD-10-CM | POA: Diagnosis not present

## 2020-12-28 MED ORDER — ENTRESTO 49-51 MG PO TABS: 1.0000 | ORAL_TABLET | Freq: Two times a day (BID) | ORAL | 3 refills | Status: DC

## 2020-12-28 NOTE — Progress Notes (Signed)
Patient ID: Destiny Beasley                 DOB: 1955/09/15                      MRN: [id number]       HPI: Destiny Beasley is a 65 y.o. female patient of Dr. Burt Knack referred by Richardson Dopp, PA to HTN clinic. PMH is significant for CAD s/p STEMI in [id number], systolic CHF w/ last EF 99991111 recovered to 40-45%, T2DM, prior TIA, HTN and HLD. Pt has previously followed with PharmD team for cholesterol management for which she currently takes Praluent injections.    She was last seen in HTN clinic on 12/14/20. Blood pressure at home was better controlled than in the clinic. She has a history of multiple drug intolerances and self adjusting medications. Carvedilol was switched to metoprolol succinate in follow-up with [facility] on 6/17. Hydralazine '10mg'$  BID was started on 7/11 and was increased to '20mg'$  TID on 7/15, but at 8/23 visit she states "she stopped it." She was agreeable to re-trying Entresto, and her 24/26 mg BID dose was subsequently increased to 49/'51mg'$  BID on the same day after BMP results. Continues to be seen by [facility] for anxiety.  Patient presents today to PharmD clinic. She has been taking Entresto 24/'26mg'$  BID and reports not taking the 46/'51mg'$  BID dose due to confusion on where it is being filled (prescription was sent to [facility] after 8/23 visit, but patient reports waiting for it to arrive on 9/8 from [facility]). Brought her home wrist cuff and home arm cuff (patient forgot an attachment). Reports home BP being up and down and gives me a few readings of 147/91, 161/100s. She states that BP readings are about the same between her home upper arm cuff and wrist cuff. Home wrist cuff 181/128 vs clinic reading 172/90. Second clinic BP taken was improved at 166/88. She is not taking hydralazine, stating she has it at home but only takes in an emergency. States she is taking metoprolol succinate '50mg'$  daily. Reports improvement on SOB and has been taking inhaler for asthma.  Current HTN meds:  metoprolol 50 mg succinate daily (AM), Entresto 24/26 mg twice a day Previously tried: spironolactone 25 mg daily (wheezing vs. fatigue), hydralazine 25, 50 mg TID (fatigue), Imdur 30 mg daily (wheezing), Entresto (?), Invokana (frequent yeast infections), carvedilol '25mg'$  BID (wheezing), amlodipine 10 mg daily (makes her muscles hurt), chlorthalidone 12.'5mg'$  (leg pain?), Jardiance (yeast infections) BP goal: <130/80 mmHg   Family History: The patient's family history includes Diabetes in her mother; Hyperlipidemia in her mother; Hypertension in her mother.   Social History: never smoked, + ETOH   Diet: drinks: water, coffee (2 cups), Gatorade zero Fresh or frozen vegetables  Exercise: 20 min walking on treadmill 5 times a week   Home BP readings: 147/91, 161/100s  Wt Readings from Last 3 Encounters:  12/14/20 268 lb (121.6 kg)  12/03/20 268 lb (121.6 kg)  11/01/20 265 lb (120.2 kg)   BP Readings from Last 3 Encounters:  12/14/20 (!) 182/90  12/03/20 (!) 172/102  11/08/20 (!) 160/101   Pulse Readings from Last 3 Encounters:  12/14/20 86  12/03/20 89  11/08/20 76    Renal function: Estimated Creatinine Clearance: 103.9 mL/min (by C-G formula based on SCr of 0.82 mg/dL).  Past Medical History:  Diagnosis Date   Anemia    Anxiety    CAD in native artery  a. Inf STEMI [id number] complicated by cardiogenic shock/complete heart block s/p emergent cath showing culprit large dominant LCx s/p aspiration thrombectomy and DES, EF 50% by cath, 40-45% by echo.   Chronic systolic heart failure ([place]) 06/25/2017   Echo 11/19: mild LVH, EF 40-45, inf-lat HK, Gr 1 DD, trivial MR   Complete heart block, transient ([place])    a. 06/2017 in setting of acute inf MI -> resolved after PCI.   COVID-19    Diabetes mellitus    Goiter    History of radiation therapy 06/14/2016 - 07/19/2016   Right Breast 50 Gy 25 fractions   Hypercholesteremia    Hypertension    Ischemic cardiomyopathy    a. EF 50% by  cath and 40-45% by echo 06/2017.   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   Meningioma [facility])    Nuclear stress test    Nuclear stress test 10/19: EF 35, inferolateral, apical scar; inferior, apical inferior infarct with mild peri-infarct ischemia; high risk   Obesity    Personal history of chemotherapy    Personal history of radiation therapy    Pituitary tumor    Seasonal asthma    Sleep apnea    does not use every night   Stroke [facility])    TIA - Dec 1 st, 2017    Current Outpatient Medications on File Prior to Visit  Medication Sig Dispense Refill   Alirocumab (PRALUENT) 75 MG/ML SOAJ Inject 1 mL into the skin every 14 (fourteen) days. 6 mL 1   ALPRAZOLAM PO Take 12.5 mg by mouth 2 (two) times daily as needed (anxiety attack).     aspirin EC 81 MG tablet Take 1 tablet (81 mg total) by mouth daily. 90 tablet 3   Coenzyme Q10 100 MG capsule Take 100 mg by mouth daily.     Continuous Blood Gluc Receiver (FREESTYLE LIBRE 2 READER) DEVI Use reader to monitor blood sugars continuously. 1 each 0   Continuous Blood Gluc Sensor (FREESTYLE LIBRE 2 SENSOR) MISC USE 1 SENSOR ONCE EVERY 14  DAYS TO MONITOR BLOOD  SUGARS CONTINUOUSLY 6 each 1   furosemide (LASIX) 20 MG tablet Take 1 tablet (20 mg total) by mouth daily. For 3 days then take as need or fluid and swelling 90 tablet 1   glucose blood (FREESTYLE TEST STRIPS) test strip Use as instructed 100 each 12   HUMALOG 100 UNIT/ML cartridge INECT 3-25 UNITS INTO THE SKIN THREE TIMES DAILY WITH MEALS. USE PER SLIDING SCALE 30 mL 3   hydrOXYzine (ATARAX/VISTARIL) 25 MG tablet Take 1 tablet (25 mg total) by mouth 3 (three) times daily as needed for anxiety. 30 tablet 0   INPEN 100-BLUE-LILLY DEVI CONSULT MD FOR APP SETTINGS. 1 each 0   LANTUS SOLOSTAR 100 UNIT/ML Solostar Pen INJECT SUBCUTANEOUSLY 70  UNITS DAILY 75 mL 3   liraglutide (VICTOZA) 18 MG/3ML SOPN  1.2 mg for 7 days and then 1.8 mg daily 6 mL 2   metoprolol succinate (TOPROL-XL) 50 MG 24 hr tablet Take 1 tablet (50 mg total) by mouth daily. Take with or immediately following a meal. 90 tablet 3   nitroGLYCERIN (NITROSTAT) 0.4 MG SL tablet DISSOLVE ONE TABLET UNDER TONGUE AS NEEDED FOR CHEST PAIN 25 tablet 4   Omega-3 Fatty Acids (FISH OIL OMEGA-3 PO) Take 1 tablet by mouth daily.     sacubitril-valsartan (ENTRESTO) 49-51  MG Take 1 tablet by mouth 2 (two) times daily. 60 tablet 11   Semaglutide,0.25 or 0.'5MG'$ /DOS, (OZEMPIC, 0.25 OR 0.5 MG/DOSE,) 2 MG/1.5ML SOPN Inject 0.5 mg into the skin once a week. 4.5 mL 2   valACYclovir (VALTREX) 1000 MG tablet Take 1,000 mg by mouth 2 (two) times daily as needed (infection).     venlafaxine (EFFEXOR) 75 MG tablet Take 1 tablet (75 mg total) by mouth 2 (two) times daily. 60 tablet 0   No current facility-administered medications on file prior to visit.    Allergies  Allergen Reactions   Hydrocodone Other (See Comments)    Severe anxiety Confusion    Invokana [Canagliflozin] Other (See Comments)    Constant yeast infections   Amlodipine Other (See Comments)    Muscles hurt   Carvedilol Other (See Comments)    Wheezing with '25mg'$  BID dosing NOT ALLERGIC 10/08/20   Chlorthalidone Other (See Comments)    Leg pain   Evolocumab Other (See Comments)    Injection site pain   Hydralazine Other (See Comments)    Fatigue   Imdur [Isosorbide Nitrate] Other (See Comments)    Patient reported wheezing   Jardiance [Empagliflozin] Other (See Comments)    lightheadedness, weakness, low BP and yeast infection.  NOT ALLERGIC 10/08/20   Rosuvastatin Other (See Comments)    myalgias   Simvastatin Other (See Comments)    myalgias   Spironolactone Other (See Comments)    Wheezing vs fatigue?   Atorvastatin Other (See Comments)    DIZZINESS   Compazine [Prochlorperazine] Anxiety    There were no vitals taken for this visit.  Assessment/Plan:  1.  Hypertension/CHF - Blood pressure is above goal of <130/80 in clinic today. Her wrist cuff is inaccurate. Was unable to determine the accuracy of her home arm cuff. Bring in upper arm cuff to next visit. Her BPs at home are better than in clinic, but still not well controlled. Increase Entrestro dose to 49/'51mg'$  BID. Follow up visit scheduled in 2 weeks.  Thank you,  Melanie Crazier, [place]  [id number] [place], [place] [address]  Phone: [phone]; Fax: [phone]

## 2020-12-28 NOTE — Patient Instructions (Signed)
Please increase your Entresto to 49/'51mg'$  twice a day Continue metoprolol '50mg'$  daily  Please bring your upper arm home cuff you your next visit

## 2020-12-29 ENCOUNTER — Other Ambulatory Visit (HOSPITAL_COMMUNITY): Payer: 59

## 2020-12-29 ENCOUNTER — Encounter (HOSPITAL_COMMUNITY): Payer: Self-pay | Admitting: Psychiatry

## 2020-12-29 NOTE — Progress Notes (Addendum)
Virtual Visit via Video Note  I connected with Destiny Beasley on 12/24/20 at  9:00 AM EDT by a video enabled telemedicine application and verified that I am speaking with the correct person using two identifiers.  At orientation to the IOP program, Case Manager discussed the limitations of evaluation and management by telemedicine and the availability of in person appointments. The patient expressed understanding and agreed to proceed with virtual visits throughout the duration of the program.   Location:  Patient: Patient Home Provider: Home Office   History of Present Illness: GAD  Observations/Objective: Check In: Case Manager checked in with all participants to review discharge dates, insurance authorizations, work-related documents and needs from the treatment team regarding medications. Client stated needs and engaged in discussion.   Initial Therapeutic Activity: Counselor facilitated a check-in with group members to assess mood and current functioning. Client shared details of their mental health management since our last session, including challenges and successes. Counselor engaged group in discussion, covering the following topics: reviewed crisis safety plan, made revisions and shared community crisis services, relaxation techniques, boundary setting, and protective factors. Client presents with mild depression and mild anxiety. Client denied any current SI/HI/psychosis.  Second Therapeutic Activity: Counselor introduced [employer], representative with [employer] to share about programming. Group Members asked questions and engaged in discussion, as Cristie Hem shared about Peer Support, Support Groups and the [employer]. Client stated that they are interested in connecting with the peer support and women's support groups.  Check Out: Counselor closed program by allowing time to celebrate a graduating group member. Counselor shared reflections on progress and allow space for  group members to share well wishes and encouragements with the graduating client. Counselor prompted graduating client to share takeaways, reflect on progress and final thoughts for the group. Client endorsed safety plan to be followed to prevent safety issues.  Assessment and Plan: Clinician recommends that Client to step down from IOP to individual therapy, medication management, and community resources. Clinician recommends adherence to crisis/safety plan, taking medications as prescribed, and following up with medical professionals if any issues arise.    Follow Up Instructions: Clinician will send Webex link for next session. The Client was advised to call back or seek an in-person evaluation if the symptoms worsen or if the condition fails to improve as anticipated.     I provided 180 minutes of non-face-to-face time during this encounter.     Lise Auer, LCSW

## 2020-12-30 ENCOUNTER — Telehealth: Payer: Self-pay | Admitting: Pharmacy Technician

## 2020-12-30 NOTE — Telephone Encounter (Addendum)
Patient Advocate Encounter   Received notification from [place] that prior authorization for OMNIPOD PODS is required.   PA submitted on 12/30/2020 Key [id number] Status is DENIED  Will follow up with patient's plan.    [facility] will continue to follow   Ronney Asters, CPhT Patient Advocate [facility] Phone: [phone] Fax:  [phone]

## 2021-01-04 ENCOUNTER — Other Ambulatory Visit: Payer: Self-pay

## 2021-01-04 ENCOUNTER — Ambulatory Visit (HOSPITAL_COMMUNITY): Payer: 59 | Admitting: Psychiatry

## 2021-01-04 DIAGNOSIS — F410 Panic disorder [episodic paroxysmal anxiety] without agoraphobia: Secondary | ICD-10-CM

## 2021-01-05 ENCOUNTER — Ambulatory Visit: Payer: 59 | Admitting: Physician Assistant

## 2021-01-06 ENCOUNTER — Encounter (HOSPITAL_COMMUNITY): Payer: Self-pay | Admitting: Psychiatry

## 2021-01-06 NOTE — Progress Notes (Signed)
Client unable to attend due to unforeseen scheduling conflicts.   Lise Auer, LCSW

## 2021-01-07 ENCOUNTER — Ambulatory Visit (INDEPENDENT_AMBULATORY_CARE_PROVIDER_SITE_OTHER): Payer: 59 | Admitting: Family

## 2021-01-07 ENCOUNTER — Other Ambulatory Visit: Payer: Self-pay

## 2021-01-07 ENCOUNTER — Encounter (HOSPITAL_BASED_OUTPATIENT_CLINIC_OR_DEPARTMENT_OTHER): Payer: Self-pay | Admitting: Family

## 2021-01-07 ENCOUNTER — Encounter (HOSPITAL_BASED_OUTPATIENT_CLINIC_OR_DEPARTMENT_OTHER): Payer: Self-pay

## 2021-01-07 VITALS — BP 144/80 | HR 80 | Ht 72.0 in | Wt 265.0 lb

## 2021-01-07 DIAGNOSIS — E785 Hyperlipidemia, unspecified: Secondary | ICD-10-CM | POA: Diagnosis not present

## 2021-01-07 DIAGNOSIS — I25118 Atherosclerotic heart disease of native coronary artery with other forms of angina pectoris: Secondary | ICD-10-CM | POA: Diagnosis not present

## 2021-01-07 DIAGNOSIS — I10 Essential (primary) hypertension: Secondary | ICD-10-CM

## 2021-01-07 DIAGNOSIS — I5022 Chronic systolic (congestive) heart failure: Secondary | ICD-10-CM | POA: Diagnosis not present

## 2021-01-07 NOTE — Progress Notes (Signed)
Office Visit    Patient Name: Destiny Beasley Date of Encounter: 01/07/2021  PCP:  Lawerance Cruel, [facility]  Cardiologist:  Sherren Mocha, MD  Advanced Practice Provider:  Liliane Shi, PA-C Electrophysiologist:  None    Chief Complaint    Destiny Beasley is a 65 y.o. female with a hx of CAD s/p inferior-lateral STEMI 06/2017 with DES to LCx, HFrEF, ICM, DM2, TIA, OSA, breast cancer, HTN, HLD with statin intolerance, COVID19, pituitary tumor s/p resection, meningioma, anxiety presents today for HFrEF follow up.  Past Medical History    Past Medical History:  Diagnosis Date   Anemia    Anxiety    CAD in native artery    a. Inf STEMI [id number] complicated by cardiogenic shock/complete heart block s/p emergent cath showing culprit large dominant LCx s/p aspiration thrombectomy and DES, EF 50% by cath, 40-45% by echo.   Chronic systolic heart failure ([place]) 06/25/2017   Echo 11/19: mild LVH, EF 40-45, inf-lat HK, Gr 1 DD, trivial MR   Complete heart block, transient ([place])    a. 06/2017 in setting of acute inf MI -> resolved after PCI.   COVID-19    Diabetes mellitus    Goiter    History of radiation therapy 06/14/2016 - 07/19/2016   Right Breast 50 Gy 25 fractions   Hypercholesteremia    Hypertension    Ischemic cardiomyopathy    a. EF 50% by cath and 40-45% by echo 06/2017.   Leg pain    ABIs 07/2019: Normal (R 1.27; L 1.25)   Malignant neoplasm of upper-outer quadrant of right female breast ([place]) 02/24/2016   Meningioma [facility])    Nuclear stress test    Nuclear stress test 10/19: EF 35, inferolateral, apical scar; inferior, apical inferior infarct with mild peri-infarct ischemia; high risk   Obesity    Personal history of chemotherapy    Personal history of radiation therapy    Pituitary tumor    Seasonal asthma    Sleep apnea    does not use every night   Stroke [facility])    TIA - Dec 1 st, 2017   Past Surgical History:  Procedure  Laterality Date   ABDOMINAL HYSTERECTOMY     BRAIN MENINGIOMA EXCISION  2005   BREAST BIOPSY     BREAST LUMPECTOMY Right    2017   BREAST LUMPECTOMY WITH RADIOACTIVE SEED LOCALIZATION Right 04/20/2016   Procedure: RIGHT BREAST LUMPECTOMY WITH RADIOACTIVE SEED LOCALIZATION;  Surgeon: Fanny Skates, MD;  Location: [place];  Service: General;  Laterality: Right;   BREAST REDUCTION SURGERY Bilateral 10/23/2016   Procedure: BILATERAL BREAST REDUCTION WITH LIPOSUCTION ASSISTANCE;  Surgeon: Cristine Polio, MD;  Location: [place];  Service: Plastics;  Laterality: Bilateral;   CESAREAN SECTION     CORONARY STENT INTERVENTION N/A 06/25/2017   Procedure: CORONARY STENT INTERVENTION;  Surgeon: Sherren Mocha, MD;  Location: [place] CV LAB;  Service: Cardiovascular;  Laterality: N/A;   CORONARY/GRAFT ACUTE MI REVASCULARIZATION N/A 06/25/2017   Procedure: Coronary/Graft Acute MI Revascularization;  Surgeon: Sherren Mocha, MD;  Location: [place] [facility];  Service: Cardiovascular;  Laterality: N/A;   FOOT SURGERY Bilateral 2016   hammer toe surgery   LEFT HEART CATH AND CORONARY ANGIOGRAPHY N/A 06/25/2017   Procedure: LEFT HEART CATH AND CORONARY ANGIOGRAPHY;  Surgeon: Sherren Mocha, MD;  Location: [place] [facility];  Service: Cardiovascular;  Laterality: N/A;   LEFT  HEART CATH AND CORONARY ANGIOGRAPHY N/A 05/13/2020   Procedure: LEFT HEART CATH AND CORONARY ANGIOGRAPHY;  Surgeon: Lorretta Harp, MD;  Location: [place] CV LAB;  Service: Cardiovascular;  Laterality: N/A;   MENISCUS REPAIR Left 2015   PITUITARY SURGERY     REDUCTION MAMMAPLASTY     RIGHT HEART CATH N/A 06/25/2017   Procedure: RIGHT HEART CATH;  Surgeon: Sherren Mocha, MD;  Location: [place] [facility];  Service: Cardiovascular;  Laterality: N/A;   THYROID SURGERY      Allergies  Allergies  Allergen Reactions   Hydrocodone Other (See Comments)    Severe anxiety Confusion     Invokana [Canagliflozin] Other (See Comments)    Constant yeast infections   Amlodipine Other (See Comments)    Muscles hurt   Carvedilol Other (See Comments)    Wheezing with '25mg'$  BID dosing NOT ALLERGIC 10/08/20   Chlorthalidone Other (See Comments)    Leg pain   Evolocumab Other (See Comments)    Injection site pain   Hydralazine Other (See Comments)    Fatigue   Imdur [Isosorbide Nitrate] Other (See Comments)    Patient reported wheezing   Jardiance [Empagliflozin] Other (See Comments)    lightheadedness, weakness, low BP and yeast infection.  NOT ALLERGIC 10/08/20   Rosuvastatin Other (See Comments)    myalgias   Simvastatin Other (See Comments)    myalgias   Spironolactone Other (See Comments)    Wheezing vs fatigue?   Atorvastatin Other (See Comments)    DIZZINESS   Compazine [Prochlorperazine] Anxiety    History of Present Illness    Destiny Beasley is a 65 y.o. female with a hx of CAD s/p inferior-lateral STEMI 06/2017 with DES to LCx, HFrEF, ICM, DM2, hyperthyroidism, TIA, OSA, breast cancer, HTN, HLD with statin intolerance, COVID19, pituitary tumor s/p resection, meningioma, anxiety last seen  12/28/20 by pharmacy team.   CAD dates back to inf-lat STEMI 06/2017 with DES to pLCx. Cardiac catheterization 04/2020 with patent LCx stent and pLAD with 40$ stenosis. Her LVEF since her STEMI has been 40-45%.   She was seen via telemedicine 08/2020 and started on Lasix due to volume excess. She was seen in follow up 10/08/20 with chief complaint of hot flashes which she attributed to Carvedilol. As she was being discharged from the office she developed chest pain and shortness of breath. EKG was without acute changes. BP as high as 200/100. She was transported via EMS to ED. Symptoms resolved with nitroglycerin and Xanax. Hs-troponin negative x2, d-dimer minimally elevated 0.62 though negative when adjusted for age. Head CT unremarkable.   ED visit 10/19/20 was COVID positive treated  with antiviral. Visit 11/01/20, 12/14/20, and 12/28/20 with optimization of antihypertensive regimen.  At most recent visit with pharmacy team her [name] was increased to 49-51 mg twice daily.  Presents today for follow up. Home BP routinely in the 140s on her arm cuff. Did not bring arm cuff today but asked to bring to next clinic visit to ensure accuracy. Is excited for upcoming trip next month to [place] with a friend to celebrate her birthday. Has started walking on treadmill for exercise. Reports no shortness of breath nor dyspnea on exertion. Reports no chest pain, pressure, or tightness. No edema, orthopnea, PND. Reports no palpitations.    EKGs/Labs/Other Studies Reviewed:   The following studies were reviewed today: Echocardiogram 05/13/20 EF 40-45, inf and inf-lat HK, normal RVSF, trivial MR, trivial AI   Cardiac catheterization 05/13/20 LAD  prox 40 LCx stent patent EF 45-50    ABIs 07/2019 Normal    Echocardiogram 09/05/2018 EF 40-45, inf-lat AK, mild LVH, Gr 1 DD, normal RVSF   Echo 03/07/18 Mild LVH, EF 40-45, GLS -11%, inf-lat HK, Gr 1 DD, trivial MR   Nuclear stress test 02/12/18 EF 35, inferolateral, apical scar; inferior, apical inferior infarct with mild peri-infarct ischemia; high risk   Echo 06/26/17 EF 40-45, inferolateral hypokinesis, mild MR   Cardiac catheterization 06/25/17 LM luminal irregularities LAD luminal irregularities, proximal 30 LCx proximal 100 RCA irregularities EF 45-50, inferolateral akinesis PCI: 4 x 20 mm Synergy DES to the proximal LCx   Echo 03/25/16 Moderate LVH, EF 60-65, normal wall motion   Carotid US 12/17 Bilateral ICA 1-39   EKG:  EKG is ordered today.  EKG performed today demonstrates NSR 80 bpm with stable TWI in lead III.  Recent Labs: 09/02/2020: NT-Pro BNP 318 10/08/2020: B Natriuretic Peptide 45.4 11/08/2020: ALT 24; Hemoglobin 15.0; Platelets 325 11/30/2020: TSH 3.57 12/14/2020: BUN 15; Creatinine, Ser 0.82; Potassium 3.9;  Sodium 140  Recent Lipid Panel    Component Value Date/Time   CHOL 120 10/06/2020 0743   TRIG 63 10/06/2020 0743   HDL 54 10/06/2020 0743   CHOLHDL 2.2 10/06/2020 0743   CHOLHDL 2 08/12/2018 0944   VLDL 19.2 08/12/2018 0944   LDLCALC 52 10/06/2020 0743   LDLDIRECT 67 07/15/2020 1609   LDLDIRECT 87.0 06/04/2019 0835   Home Medications   Current Meds  Medication Sig   ADVAIR HFA 115-21 MCG/ACT inhaler as needed.   albuterol (VENTOLIN HFA) 108 (90 Base) MCG/ACT inhaler SMARTSIG:1-2 Puff(s) By Mouth Every 4 Hours PRN   Alirocumab (PRALUENT) 75 MG/ML SOAJ Inject 1 mL into the skin every 14 (fourteen) days.   ALPRAZolam (XANAX) 0.25 MG tablet Take 0.25 mg by mouth as needed.   ALPRAZOLAM PO Take 12.5 mg by mouth 2 (two) times daily as needed (anxiety attack).   aspirin EC 81 MG tablet Take 1 tablet (81 mg total) by mouth daily.   Coenzyme Q10 100 MG capsule Take 100 mg by mouth daily.   Continuous Blood Gluc Receiver (FREESTYLE LIBRE 2 READER) DEVI Use reader to monitor blood sugars continuously.   Continuous Blood Gluc Sensor (FREESTYLE LIBRE 2 SENSOR) MISC USE 1 SENSOR ONCE EVERY 14  DAYS TO MONITOR BLOOD  SUGARS CONTINUOUSLY   fluconazole (DIFLUCAN) 150 MG tablet Take 150 mg by mouth as needed.   furosemide (LASIX) 20 MG tablet Take 1 tablet (20 mg total) by mouth daily. For 3 days then take as need or fluid and swelling   glucose blood (FREESTYLE TEST STRIPS) test strip Use as instructed   HUMALOG 100 UNIT/ML cartridge INECT 3-25 UNITS INTO THE SKIN THREE TIMES DAILY WITH MEALS. USE PER SLIDING SCALE   hydrALAZINE (APRESOLINE) 10 MG tablet Take 10 mg by mouth as needed.   hydrOXYzine (ATARAX/VISTARIL) 25 MG tablet Take 1 tablet (25 mg total) by mouth 3 (three) times daily as needed for anxiety.   INPEN 100-BLUE-LILLY DEVI CONSULT MD FOR APP SETTINGS.   LANTUS SOLOSTAR 100 UNIT/ML Solostar Pen INJECT SUBCUTANEOUSLY 70  UNITS DAILY   methimazole (TAPAZOLE) 5 MG tablet Take by mouth  daily.   metoprolol succinate (TOPROL-XL) 50 MG 24 hr tablet Take 1 tablet (50 mg total) by mouth daily. Take with or immediately following a meal.   nitroGLYCERIN (NITROSTAT) 0.4 MG SL tablet DISSOLVE ONE TABLET UNDER TONGUE AS NEEDED FOR CHEST PAIN   Omega-3  Fatty Acids (FISH OIL OMEGA-3 PO) Take 1 tablet by mouth daily.   pantoprazole (PROTONIX) 40 MG tablet Take 40 mg by mouth daily.   sacubitril-valsartan (ENTRESTO) 49-51 MG Take 1 tablet by mouth 2 (two) times daily.   Semaglutide,0.25 or 0.'5MG'$ /DOS, (OZEMPIC, 0.25 OR 0.5 MG/DOSE,) 2 MG/1.5ML SOPN Inject 0.5 mg into the skin once a week.   valACYclovir (VALTREX) 1000 MG tablet Take 1,000 mg by mouth 2 (two) times daily as needed (infection).   venlafaxine (EFFEXOR) 75 MG tablet Take 1 tablet (75 mg total) by mouth 2 (two) times daily.     Review of Systems      All other systems reviewed and are otherwise negative except as noted above.  Physical Exam    VS:  BP (!) 144/80   Pulse 80   Ht 6' (1.829 m)   Wt 265 lb (120.2 kg)   SpO2 98%   BMI 35.94 kg/m  , BMI Body mass index is 35.94 kg/m.  Wt Readings from Last 3 Encounters:  01/07/21 265 lb (120.2 kg)  12/28/20 267 lb (121.1 kg)  12/14/20 268 lb (121.6 kg)    GEN: Well nourished, overweight, well developed, in no acute distress. HEENT: normal. Neck: Supple, no JVD, carotid bruits, or masses. Cardiac: RRR, no murmurs, rubs, or gallops. No clubbing, cyanosis, edema. Radials/PT 2+ and equal bilaterally.  Respiratory:  Respirations regular and unlabored, clear to auscultation bilaterally. GI: Soft, nontender, nondistended. MS: No deformity or atrophy. Skin: Warm and dry, no rash. Neuro:  Strength and sensation are intact. Psych: Normal affect.  Assessment & Plan    CAD - Stable with no anginal symptoms. No indication for ischemic evaluation.   GDMT includes Praluent, Aspirin, Metoprolol. Heart healthy diet and regular cardiovascular exercise encouraged.   HFrEF / ICM -  05/13/20 LVEF 40-45%. Notes mild left pedal edema for which she will take dose of her PRN Lasix tomorrow.Mar[facility] Kitchen GDMT includes Toprol (previous intolerance to Carvedilol), Entresto Lasix PRN.Heart healthy diet and regular cardiovascular exercise encouraged.  Educated to report weight gain of 2 lb overnight or 5 lb in 1 week. BMP today and if normal renal function, potassium will plan to up titrate Entresto to max dose due to BP not at goal. Consider repeat echo 3 months after optimization of HF therapy.  HLD, LDL goal <70 - 10/06/20 LDL 52. Continue Praluent. Intolerance to Rosuvastatin, Simvastatin, Atorvastatin with myalgias.Heart healthy diet and regular cardiovascular exercise encouraged.    HTN - Bp improved but not to goal of <130/80. Plan for BMP today and if stable renal function and potassium further up-titrate Entresto.   Anxiety - Continue to follow with PCP.   OSA - CPAP compliance encouraged. Tells me she is wearing every night.  Disposition: Follow up in 3 weeks with pharmacy team.  Signed, Loel Dubonnet, NP 01/07/2021, 1:42 PM [place]

## 2021-01-07 NOTE — Patient Instructions (Signed)
Medication Instructions:  Continue your current medications.   Take a dose of your Furosemide (lasix) tomorrow to help with foot swelling.   If your kidney numbers and potassium are normal we will plan to further increase your Entresto but we will wait for lab results first.   *If you need a refill on your cardiac medications before your next appointment, please call your pharmacy*   Lab Work: Your physician recommends that you return for lab work today for [facility]  If you have labs (blood work) drawn today and your tests are completely normal, you will receive your results only by: [place] (if you have MyChart) OR A paper copy in the mail If you have any lab test that is abnormal or we need to change your treatment, we will call you to review the results.   Testing/Procedures: Your EKG today showed normal sinus rhythm which is a good result!   Follow-Up: At [facility], you and your health needs are our priority.  As part of our continuing mission to provide you with exceptional heart care, we have created designated Provider Care Teams.  These Care Teams include your primary Cardiologist (physician) and Advanced Practice Providers (APPs -  Physician Assistants and Nurse Practitioners) who all work together to provide you with the care you need, when you need it.  We recommend signing up for the patient portal called "MyChart".  Sign up information is provided on this After Visit Summary.  MyChart is used to connect with patients for Virtual Visits (Telemedicine).  Patients are able to view lab/test results, encounter notes, upcoming appointments, etc.  Non-urgent messages can be sent to your provider as well.   To learn more about what you can do with MyChart, go to [email or web address].    Your next appointment:   4 months  The format for your next appointment:   In Person  Provider:   You may see Sherren Mocha, MD or one of the following Advanced Practice Providers  on your designated Care Team:   Richardson Dopp, PA-C [place], [place] Loel Dubonnet, NP    Other Instructions  Heart Healthy Diet Recommendations: A low-salt diet is recommended. Meats should be grilled, baked, or boiled. Avoid fried foods. Focus on lean protein sources like fish or chicken with vegetables and fruits. The American Heart Association is a Microbiologist!  Exercise recommendations: The American Heart Association recommends 150 minutes of moderate intensity exercise weekly. Try 30 minutes of moderate intensity exercise 4-5 times per week. This could include walking, jogging, or swimming.

## 2021-01-08 ENCOUNTER — Encounter (HOSPITAL_BASED_OUTPATIENT_CLINIC_OR_DEPARTMENT_OTHER): Payer: Self-pay

## 2021-01-08 ENCOUNTER — Encounter (HOSPITAL_BASED_OUTPATIENT_CLINIC_OR_DEPARTMENT_OTHER): Payer: Self-pay | Admitting: Family

## 2021-01-08 LAB — BASIC METABOLIC PANEL
BUN/Creatinine Ratio: 14 (ref 12–28)
BUN: 14 mg/dL (ref 8–27)
CO2: 28 mmol/L (ref 20–29)
Calcium: 9.8 mg/dL (ref 8.7–10.3)
Chloride: 104 mmol/L (ref 96–106)
Creatinine, Ser: 1 mg/dL (ref 0.57–1.00)
Glucose: 145 mg/dL — ABNORMAL HIGH (ref 65–99)
Potassium: 4.2 mmol/L (ref 3.5–5.2)
Sodium: 145 mmol/L — ABNORMAL HIGH (ref 134–144)
eGFR: 64 mL/min/{1.73_m2} (ref >59)

## 2021-01-10 ENCOUNTER — Telehealth: Payer: Self-pay | Admitting: *Deleted

## 2021-01-10 ENCOUNTER — Encounter (HOSPITAL_BASED_OUTPATIENT_CLINIC_OR_DEPARTMENT_OTHER): Payer: Self-pay

## 2021-01-10 DIAGNOSIS — I5020 Unspecified systolic (congestive) heart failure: Secondary | ICD-10-CM

## 2021-01-10 DIAGNOSIS — Z79899 Other long term (current) drug therapy: Secondary | ICD-10-CM

## 2021-01-10 MED ORDER — SACUBITRIL-VALSARTAN 97-103 MG PO TABS: 1.0000 | ORAL_TABLET | Freq: Two times a day (BID) | ORAL | 2 refills | Status: DC

## 2021-01-10 NOTE — Telephone Encounter (Signed)
Patient is aware  will have labwork  at [facility]  location . The increase in Entresto to 97/103 mg twice a day

## 2021-01-10 NOTE — Telephone Encounter (Signed)
-----   Message from Loel Dubonnet, NP sent at 01/08/2021  8:04 PM EDT ----- Normal kidney function and potassium. Sodium very mildly elevated, not of concern. Increase Entresto to 97-'103mg'$  BID with repeat BMP in 2 weeks.

## 2021-01-11 ENCOUNTER — Ambulatory Visit: Payer: 59

## 2021-01-11 MED ORDER — PRALUENT 75 MG/ML ~~LOC~~ SOAJ: 1.0000 mL | SUBCUTANEOUS | 1 refills | Status: DC

## 2021-01-11 NOTE — Telephone Encounter (Signed)
Patient Advocate Encounter   Received notification from [employer] that prior authorization for OMNIPOD KIT AND PODS is required.   PA submitted on 01/11/21, complete online [employer] form and submitted lab results. Status is pending    [facility] will continue to follow   Burney Gauze, CPhT Patient [facility] Phone: [phone] Fax:  [phone]

## 2021-01-12 ENCOUNTER — Other Ambulatory Visit: Payer: Self-pay | Admitting: Cardiovascular Disease

## 2021-01-13 NOTE — Telephone Encounter (Signed)
Notified patient to let her know that additional information was sent.

## 2021-01-13 NOTE — Psych (Signed)
Virtual Visit via Video Note  I connected with Destiny Beasley on 11/29/20 at  9:00 AM EDT by a video enabled telemedicine application and verified that I am speaking with the correct person using two identifiers.  Location: Patient: patient home Provider: clinical home office   I discussed the limitations of evaluation and management by telemedicine and the availability of in person appointments. The patient expressed understanding and agreed to proceed.  I discussed the assessment and treatment plan with the patient. The patient was provided an opportunity to ask questions and all were answered. The patient agreed with the plan and demonstrated an understanding of the instructions.   The patient was advised to call back or seek an in-person evaluation if the symptoms worsen or if the condition fails to improve as anticipated.  Pt was provided 240 minutes of non-face-to-face time during this encounter.   [name], LCSW    [facility] PHP THERAPIST PROGRESS NOTE  Destiny Beasley [id number]  Session Time: 9:00 - 10:00  Participation Level: Active  Behavioral Response: CasualAlertAnxious  Type of Therapy: Group Therapy  Treatment Goals addressed: Coping  Interventions: CBT, DBT, Supportive, and Reframing  Summary: Clinician led check-in regarding current stressors and situation. Clinician utilized active listening and empathetic response and validated patient emotions. Clinician facilitated processing group on pertinent issues.   Therapist Response: Destiny Beasley is a 65 y.o. female who presents with panic disorder. Patient arrived within time allowed and reports that she is feeling "great right now." Patient rates her mood at a 10 on a scale of 1-10 with 10 being great. Pt reports her weekend was up and down, with some positive social interaction and also significant anxiety. Pt reports at least 3 panic attacks a day and not sleeping more than 3 hours at night. Pt reports  anxiety regarding taking her medication. Pt able to process. Pt engaged in discussion.         Session Time: 10:00 - 11:00   Participation Level: Active   Behavioral Response: CasualAlertDepressed   Type of Therapy: Group Therapy   Treatment Goals addressed: Coping   Interventions: CBT, DBT, Supportive and Reframing   Summary: Cln led discussion on procrastination. Cln encouraged pt's to consider the feeling that is feeding procrastination and address it as a way of alleviating desire to procrastinate. Cln applied CBT thought challenging and DBT distress tolerance skills to aid discussion.      Therapist Response: Pt engaged in discussion and identifies the root feeling of their procrastination.         Session Time: 11:00- 12:00   Participation Level: Active   Behavioral Response: CasualAlertDepressed   Type of Therapy: Group Therapy, OT   Treatment Goals addressed: Coping   Interventions: Psychosocial skills training, Supportive   Summary: Occupational Therapy group   Therapist Response: Patient engaged in group. See OT note.           Session Time: 12:00 -1:00   Participation Level: Active   Behavioral Response: CasualAlertDepressed   Type of Therapy: Group therapy   Treatment Goals addressed: Coping   Interventions: CBT; Solution focused; Supportive; Reframing   Summary: 12:00 - 12:50: Cln led discussion on healthy aggression substitutes. Cln discussed the benefits to discharging energy and adrenaline when feeling "revved up" in emotion and the importance of balancing that discharge with safety and lack if negative consequences. Group brainstormed different ways to channel aggression in a healthy way and shared ways that have worked for them in  the past. 12:50 -1:00 Clinician led check-out. Clinician assessed for immediate needs, medication compliance and efficacy, and safety concerns   Therapist Response: 12:50 - 1:00: Pt engaged in discussion and  identifies 3 options to try.  12:50 - 1:00: At check-out, patient rates her mood at a 9 on a scale of 1-10 with 10 being great. Pt reports afternoon plans of tidying. Pt demonstrates some progress as evidenced by participating in first group session. Patient denies SI/HI at the end of group.    Suicidal/Homicidal: Nowithout intent/plan  Plan: Pt will continue in PHP while working to decrease panic attacks and increase ability to manage symptoms in a healthy manner.   Diagnosis: Panic disorder [F41.0]    1. Panic disorder       [name], [place] 01/13/2021

## 2021-01-13 NOTE — Psych (Signed)
Virtual Visit via Video Note  I connected with [name] on 11/29/20 at  9:00 AM EDT by a video enabled telemedicine application and verified that I am speaking with the correct person using two identifiers.  Location: Patient: patient home Provider: clinical home office   I discussed the limitations of evaluation and management by telemedicine and the availability of in person appointments. The patient expressed understanding and agreed to proceed.   I discussed the assessment and treatment plan with the patient. The patient was provided an opportunity to ask questions and all were answered. The patient agreed with the plan and demonstrated an understanding of the instructions.   The patient was advised to call back or seek an in-person evaluation if the symptoms worsen or if the condition fails to improve as anticipated.  Cln and pt completed treatment plan and pt stated verbal alignment with plan. Pt gave verbal consent to treatment and virtual treatment, agreement with group commitments, and permission to release information for purposes of any requested paperwork.   I provided 10 minutes of non-face-to-face time during this encounter.   [name], LCSW

## 2021-01-18 ENCOUNTER — Other Ambulatory Visit: Payer: Self-pay

## 2021-01-18 ENCOUNTER — Other Ambulatory Visit: Payer: 59

## 2021-01-18 ENCOUNTER — Other Ambulatory Visit (INDEPENDENT_AMBULATORY_CARE_PROVIDER_SITE_OTHER): Payer: 59

## 2021-01-18 DIAGNOSIS — R7989 Other specified abnormal findings of blood chemistry: Secondary | ICD-10-CM

## 2021-01-18 DIAGNOSIS — E1165 Type 2 diabetes mellitus with hyperglycemia: Secondary | ICD-10-CM | POA: Diagnosis not present

## 2021-01-18 DIAGNOSIS — Z794 Long term (current) use of insulin: Secondary | ICD-10-CM

## 2021-01-18 LAB — BASIC METABOLIC PANEL
BUN: 15 mg/dL (ref 6–23)
CO2: 32 mEq/L (ref 19–32)
Calcium: 9.4 mg/dL (ref 8.4–10.5)
Chloride: 103 mEq/L (ref 96–112)
Creatinine, Ser: 0.98 mg/dL (ref 0.40–1.20)
GFR: 61.66 mL/min (ref >60.00)
Glucose, Bld: 154 mg/dL — ABNORMAL HIGH (ref 70–99)
Potassium: 4.4 mEq/L (ref 3.5–5.1)
Sodium: 141 mEq/L (ref 135–145)

## 2021-01-18 LAB — T4, FREE: Free T4: 0.8 ng/dL (ref 0.60–1.60)

## 2021-01-18 LAB — TSH: TSH: 1.26 u[IU]/mL (ref 0.35–5.50)

## 2021-01-18 LAB — T3, FREE: T3, Free: 3.1 pg/mL (ref 2.3–4.2)

## 2021-01-20 ENCOUNTER — Other Ambulatory Visit: Payer: Self-pay

## 2021-01-20 ENCOUNTER — Encounter: Payer: Self-pay | Admitting: Endocrinology

## 2021-01-20 ENCOUNTER — Telehealth: Payer: Self-pay | Admitting: Pharmacy Technician

## 2021-01-20 ENCOUNTER — Ambulatory Visit (INDEPENDENT_AMBULATORY_CARE_PROVIDER_SITE_OTHER): Payer: 59 | Admitting: Endocrinology

## 2021-01-20 VITALS — BP 162/104 | HR 62 | Ht 72.0 in | Wt 270.4 lb

## 2021-01-20 DIAGNOSIS — E1165 Type 2 diabetes mellitus with hyperglycemia: Secondary | ICD-10-CM

## 2021-01-20 DIAGNOSIS — Z794 Long term (current) use of insulin: Secondary | ICD-10-CM | POA: Diagnosis not present

## 2021-01-20 DIAGNOSIS — I10 Essential (primary) hypertension: Secondary | ICD-10-CM | POA: Diagnosis not present

## 2021-01-20 MED ORDER — TIRZEPATIDE 5 MG/0.5ML ~~LOC~~ SOAJ: 5.0000 mg | SUBCUTANEOUS | 0 refills | Status: DC

## 2021-01-20 NOTE — Patient Instructions (Addendum)
Lantus 55 twice daily  Carb ratio 1:6  Take 2 shots of Ozempic 0.5mg  at same time  Mounjaro 5mg  weekly for 4 shots and call for 7.5mg  weekly  Exercise !

## 2021-01-20 NOTE — Psych (Signed)
Virtual Visit via Video Note  I connected with Destiny Beasley on 12/02/20 at 09:00 AM EDT by a video enabled telemedicine application and verified that I am speaking with the correct person using two identifiers.  Location: Patient: patient home Provider: clinical home office   I discussed the limitations of evaluation and management by telemedicine and the availability of in person appointments. The patient expressed understanding and agreed to proceed.  I discussed the assessment and treatment plan with the patient. The patient was provided an opportunity to ask questions and all were answered. The patient agreed with the plan and demonstrated an understanding of the instructions.   The patient was advised to call back or seek an in-person evaluation if the symptoms worsen or if the condition fails to improve as anticipated.  Pt was provided 240 minutes of non-face-to-face time during this encounter.   [name], LCSW    [facility] PHP THERAPIST PROGRESS NOTE  Destiny Beasley [id number]  Session Time: 9:00 - 10:00  Participation Level: Active  Behavioral Response: CasualAlertAnxious  Type of Therapy: Group Therapy  Treatment Goals addressed: Coping  Interventions: CBT, DBT, Supportive, and Reframing  Summary: Clinician led check-in regarding current stressors and situation. Clinician utilized active listening and empathetic response and validated patient emotions. Clinician facilitated processing group on pertinent issues.   Therapist Response: Destiny Beasley is a 65 y.o. female who presents with panic disorder. Patient arrived within time allowed and reports that she is feeling "okay." Patient rates her mood at a 7 on a scale of 1-10 with 10 being great. Pt reports she is feeling physically ill with a headache and back pain. Pt reports anxiety yesterday while trying to get her nails done. Pt states taking PRN medication and still struggling. Pt reports continued frustration  with the unpredictability of her panic attacks and the distress they cause. Pt able to process. Pt engaged in discussion.         Session Time: 10:00 - 11:00   Participation Level: Active   Behavioral Response: CasualAlertDepressed   Type of Therapy: Group Therapy   Treatment Goals addressed: Coping   Interventions: CBT, DBT, Supportive and Reframing   Summary: Cln led processing group for pt's current struggles. Group members shared stressors and provided support and feedback. Cln brought in topics of boundaries, healthy relationships, and unhealthy thought processes to inform discussion.      Therapist Response: Pt able to process and provide support to group.           Session Time: 11:00 - 12:00   Participation Level: Active   Behavioral Response: CasualAlertDepressed   Type of Therapy: Group Therapy   Treatment Goals addressed: Coping   Interventions: CBT, DBT, Supportive and Reframing   Summary: Cln led discussion on forgiveness. Group members shared ways in which they struggle with forgiveness and how it has hurt them. Cln provided space for group to process. Cln encouraged pt's to consider forgiveness as a journey to free themselves from something holding them back.      Therapist Response: Pt engaged in discussion and is able to process.             Session Time: 12:00 -1:00   Participation Level: Active   Behavioral Response: CasualAlertDepressed   Type of Therapy: Group therapy   Treatment Goals addressed: Coping   Interventions: Psychosocial skills training, Supportive   Summary: 12:00 - 12:50: Occupational Therapy group 12:50 -1:00 Clinician led check-out. Clinician assessed for immediate needs,  medication compliance and efficacy, and safety concerns   Therapist Response: 12:00 - 12:50: Patient engaged in group. See OT note.  12:50 - 1:00: At check-out, patient rates her mood at a 8 on a scale of 1-10 with 10 being great. Pt reports afternoon  plans of resting. Pt demonstrates some progress as evidenced by utilizing skills to manage anxiety. Patient denies SI/HI at the end of group.    Suicidal/Homicidal: Nowithout intent/plan  Plan: Pt will continue in PHP while working to decrease panic attacks and increase ability to manage symptoms in a healthy manner.   Diagnosis: Panic disorder [F41.0]    1. Panic disorder       [name],  01/20/2021

## 2021-01-20 NOTE — Psych (Signed)
Virtual Visit via Video Note  I connected with Destiny Beasley on 11/30/20 at 09:00 AM EDT by a video enabled telemedicine application and verified that I am speaking with the correct person using two identifiers.  Location: Patient: patient home Provider: clinical home office   I discussed the limitations of evaluation and management by telemedicine and the availability of in person appointments. The patient expressed understanding and agreed to proceed.  I discussed the assessment and treatment plan with the patient. The patient was provided an opportunity to ask questions and all were answered. The patient agreed with the plan and demonstrated an understanding of the instructions.   The patient was advised to call back or seek an in-person evaluation if the symptoms worsen or if the condition fails to improve as anticipated.  Pt was provided 240 minutes of non-face-to-face time during this encounter.   [name], LCSW    [facility] PHP THERAPIST PROGRESS NOTE  Destiny Beasley [id number]  Session Time: 9:00 - 10:00  Participation Level: Active  Behavioral Response: CasualAlertAnxious  Type of Therapy: Group Therapy  Treatment Goals addressed: Coping  Interventions: CBT, DBT, Supportive, and Reframing  Summary: Clinician led check-in regarding current stressors and situation. Clinician utilized active listening and empathetic response and validated patient emotions. Clinician facilitated processing group on pertinent issues.   Therapist Response: Destiny Beasley is a 65 y.o. female who presents with panic disorder. Patient arrived within time allowed and reports that she is feeling "good right now." Patient rates her mood at a 8 on a scale of 1-10 with 10 being great. Pt reports she felt "off" yesterday and noticed increased anxiety about being alone. Pt reports significant anxiety. Pt reports utilizing PRN medication which provided some relief. Pt able to process. Pt engaged in  discussion.         Session Time: 10:00 - 11:00   Participation Level: Active   Behavioral Response: CasualAlertDepressed   Type of Therapy: Group Therapy   Treatment Goals addressed: Coping   Interventions: CBT, DBT, Supportive and Reframing   Summary: Cln introduced topic of stress management and the model of the "4 A's of stress management:" avoid, alter, accept, and adapt. Group members worked through Advice worker and discussed barriers to utilizing the 4 A's for stressors.      Therapist Response: Pt engaged in discussion and reports understanding of how to utilize the 4 A's.           Session Time: 11:00- 12:00   Participation Level: Active   Behavioral Response: CasualAlertDepressed   Type of Therapy: Group Therapy, OT   Treatment Goals addressed: Coping   Interventions: Psychosocial skills training, Supportive   Summary: Occupational Therapy group   Therapist Response: Patient engaged in group. See OT note.           Session Time: 12:00 -1:00   Participation Level: Active   Behavioral Response: CasualAlertDepressed   Type of Therapy: Group therapy   Treatment Goals addressed: Coping   Interventions: CBT; Solution focused; Supportive; Reframing   Summary: 12:00 - 12:50: Cln led discussion on rest. Cln discussed the need to rewrite social story of rest being earned or last on the list and assert that rest is productive. Group members discussed barriers to allowing themselves rest and the negative self-talk involved.  Cln worked with group to thought challenge and apply self-coaching strategies. 12:50 -1:00 Clinician led check-out. Clinician assessed for immediate needs, medication compliance and efficacy, and safety concerns   Therapist  Response: 12:50 - 1:00: Pt engaged in discussion and reports struggle with allowing themselves rest.  12:50 - 1:00: At check-out, patient rates her mood at a 9 on a scale of 1-10 with 10 being great. Pt reports afternoon  plans of responding to messages. Pt demonstrates some progress as evidenced by willingness to use medication. Patient denies SI/HI at the end of group.    Suicidal/Homicidal: Nowithout intent/plan  Plan: Pt will continue in PHP while working to decrease panic attacks and increase ability to manage symptoms in a healthy manner.   Diagnosis: Panic disorder [F41.0]    1. Panic disorder   2. Generalized anxiety disorder with panic attacks   3. Difficulty coping       [name], LCSW 01/20/2021

## 2021-01-20 NOTE — Telephone Encounter (Signed)
Received a fax regarding Prior Authorization from [employer] for [facility]. Authorization has been DENIED because REQUIREMENTS NOT MET.   APPEAL FAXED ON 01/20/21 WITH SUPPORTING DOCUMENTS.  WILL CONTINUE TO FOLLOW UP APPROVAL.

## 2021-01-20 NOTE — Psych (Signed)
Virtual Visit via Video Note  I connected with Destiny Beasley on 12/03/20 at 09:00 AM EDT by a video enabled telemedicine application and verified that I am speaking with the correct person using two identifiers.  Location: Patient: patient home Provider: clinical home office   I discussed the limitations of evaluation and management by telemedicine and the availability of in person appointments. The patient expressed understanding and agreed to proceed.  I discussed the assessment and treatment plan with the patient. The patient was provided an opportunity to ask questions and all were answered. The patient agreed with the plan and demonstrated an understanding of the instructions.   The patient was advised to call back or seek an in-person evaluation if the symptoms worsen or if the condition fails to improve as anticipated.  Pt was provided 240 minutes of non-face-to-face time during this encounter.   [name], LCSW    [facility] PHP THERAPIST PROGRESS NOTE  Destiny Beasley [id number]  Session Time: 9:00 - 10:00  Participation Level: Active  Behavioral Response: CasualAlertAnxious  Type of Therapy: Group Therapy  Treatment Goals addressed: Coping  Interventions: CBT, DBT, Supportive, and Reframing  Summary: Clinician led check-in regarding current stressors and situation. Clinician utilized active listening and empathetic response and validated patient emotions. Clinician facilitated processing group on pertinent issues.   Therapist Response: Destiny Beasley is a 65 y.o. female who presents with panic disorder. Patient arrived within time allowed and reports that she is feeling "pretty good." Patient rates her mood at a 8 on a scale of 1-10 with 10 being great. Pt reports feeling "stressed out" yesterday and having to take time outs to stabilize. Pt states feeling better this morning and sleeping well. Pt reports continued struggles with unpredictability of anxiety. Pt able  to process. Pt engaged in discussion.         Session Time: 10:00 - 11:00   Participation Level: Active   Behavioral Response: CasualAlertDepressed   Type of Therapy: Group Therapy   Treatment Goals addressed: Coping   Interventions: CBT, DBT, Supportive and Reframing   Summary: Cln led processing group for pt's current struggles. Group members shared stressors and provided support and feedback. Cln brought in topics of boundaries, healthy relationships, and unhealthy thought processes to inform discussion.      Therapist Response: Pt able to process and provide support to group.           Session Time: 11:00 - 12:00   Participation Level: Active   Behavioral Response: CasualAlertDepressed   Type of Therapy: Group Therapy   Treatment Goals addressed: Coping   Interventions: CBT, DBT, Supportive and Reframing   Summary: Cln introduced grounding techniques as a coping strategy. Cln utilized handout "Detaching from emotional pain" from [employer]. Group reviewed grounding strategies and how they can apply them to their every day life and in which situations.      Therapist Response: Pt engaged in discussion and is able to identify ways to utilize the techniques.            Session Time: 12:00 -1:00   Participation Level: Active   Behavioral Response: CasualAlertDepressed   Type of Therapy: Group therapy   Treatment Goals addressed: Coping   Interventions: Psychosocial skills training, Supportive   Summary: 12:00 - 12:50: Occupational Therapy group 12:50 -1:00 Clinician led check-out. Clinician assessed for immediate needs, medication compliance and efficacy, and safety concerns   Therapist Response: 12:00 - 12:50: Patient engaged in group. See  OT note.  12:50 - 1:00: At check-out, patient rates her mood at a 9 on a scale of 1-10 with 10 being great. Pt reports afternoon plans of spending time with her brother. Pt demonstrates some progress as  evidenced by applying skills when upset. Patient denies SI/HI at the end of group.    Suicidal/Homicidal: Nowithout intent/plan  Plan: Pt will continue in PHP while working to decrease panic attacks and increase ability to manage symptoms in a healthy manner.   Diagnosis: Panic disorder [F41.0]    1. Panic disorder       [name], [place] 01/20/2021

## 2021-01-20 NOTE — Telephone Encounter (Signed)
Patient Advocate Encounter   Received notification from [place] that prior authorization for [facility] is required.   PA submitted on 01/20/21 [place] [id number] Status is pending    [facility] will continue to follow   Burney Gauze, CPhT Patient [facility] Phone: [phone] Fax:  [phone]

## 2021-01-20 NOTE — Psych (Signed)
Virtual Visit via Video Note  I connected with [name] on 12/01/20 at 09:00 AM EDT by a video enabled telemedicine application and verified that I am speaking with the correct person using two identifiers.  Location: Patient: patient home Provider: clinical home office   I discussed the limitations of evaluation and management by telemedicine and the availability of in person appointments. The patient expressed understanding and agreed to proceed.  I discussed the assessment and treatment plan with the patient. The patient was provided an opportunity to ask questions and all were answered. The patient agreed with the plan and demonstrated an understanding of the instructions.   The patient was advised to call back or seek an in-person evaluation if the symptoms worsen or if the condition fails to improve as anticipated.  Pt was provided 240 minutes of non-face-to-face time during this encounter.   [name], LCSW    [facility] PHP THERAPIST PROGRESS NOTE  ARITA SEVERTSON [id number]  Session Time: 9:00 - 10:00  Participation Level: Active  Behavioral Response: CasualAlertAnxious  Type of Therapy: Group Therapy  Treatment Goals addressed: Coping  Interventions: CBT, DBT, Supportive, and Reframing  Summary: Clinician led check-in regarding current stressors and situation. Clinician utilized active listening and empathetic response and validated patient emotions. Clinician facilitated processing group on pertinent issues.   Therapist Response: MYCHAEL SOOTS is a 65 y.o. female who presents with panic disorder. Patient arrived within time allowed and reports that she is feeling "good." Patient rates her mood at a 9 on a scale of 1-10 with 10 being great. Pt reports struggling with boundaries with work. Pt states she did not sleep well last night due to skipping her sleep medication. Pt reports continued struggles with willingness to utilize medication. Pt able to process. Pt  engaged in discussion.         Session Time: 10:00 - 11:00   Participation Level: Active   Behavioral Response: CasualAlertDepressed   Type of Therapy: Group Therapy   Treatment Goals addressed: Coping   Interventions: CBT, DBT, Supportive and Reframing   Summary: Cln led discussion on guilt and the way it impacts [place]. Cln utilized CBT cognitive distortion: emotional reasoning to inform discussion. Cln encouraged pt's to consider whether the guilt was founded as a first step to address the feeling. Group members discussed feelings of guilt and worked to determine whether those feelings were founded in truth or feeling.      Therapist Response: Pt engaged in discussion and is able to offer a guilt example and work through it with the group.          Session Time: 11:00 - 12:00   Participation Level: Active   Behavioral Response: CasualAlertDepressed   Type of Therapy: Group Therapy   Treatment Goals addressed: Coping   Interventions: Supportive, Reframing   Summary: Spiritual Care group   Therapist Response: Pt engaged in session. See chaplain note           Session Time: 12:00 -1:00   Participation Level: Active   Behavioral Response: CasualAlertDepressed   Type of Therapy: Group therapy   Treatment Goals addressed: Coping   Interventions: Psychosocial skills training, Supportive   Summary: 12:00 - 12:50: Occupational Therapy group 12:50 -1:00 Clinician led check-out. Clinician assessed for immediate needs, medication compliance and efficacy, and safety concerns   Therapist Response: 12:00 - 12:50: Patient engaged in group. See OT note.  12:50 - 1:00: At check-out, patient rates her mood at a 9  on a scale of 1-10 with 10 being great. Pt reports afternoon plans of getting a massage. Pt demonstrates some progress as evidenced by managing anxiety effectively yesterday. Patient denies SI/HI at the end of group.    Suicidal/Homicidal: Nowithout  intent/plan  Plan: Pt will continue in PHP while working to decrease panic attacks and increase ability to manage symptoms in a healthy manner.   Diagnosis: Panic disorder [F41.0]    1. Panic disorder       [name], [place] 01/20/2021

## 2021-01-20 NOTE — Progress Notes (Signed)
Patient ID: Destiny Beasley, female   DOB: [birth year], 65 y.o.   MRN: [id number]   Reason for Appointment: Type II Diabetes follow-up   History of Present Illness    Date of diagnosis: 10/2008  Previous history: She had markedly increased blood sugars at diagnosis and was tried on oral hypoglycemic drugs and Victoza for about 9 months before starting an insulin.  Since 2011 she had been on basal bolus insulin regimen Previously had had difficulty controlling her diabetes mostly because of difficulty with compliance with various aspects of self-care.  Previous regimen: Toujeo 60 units at 8 am . Apidra 20 units at times  She was started on the Omnipod insulin pump on 09/11/16  Recent history:   Insulin regimen: HUMALOG U-100 with [place] carbohydrate coverage 1: 9, sensitivity 1: 15 Lantus insulin 45-55 units daily  Non-insulin hypoglycemic drugs: Ozempic 0.5 mg weekly  Her A1c has been consistently high, last 9.3   Current management, blood sugar patterns and problems: She was restarted on Ozempic in August about 6 weeks ago  She has worked up to 0.5 mg without any side effects  However she does not appear to be benefiting significantly from this as yet with either blood sugar control or satiety  Weight is about the same  She is mostly having high postprandial readings which are progressively higher in the afternoon evenings and occasionally overnight also  She says that she will sometimes eat sweets or junk food at night  However most of the time her blood sugars seem to come down overnight with variable fasting levels  Occasionally will forget to take her mealtime insulin  Also occasional low normal reading may be from taking additional doses for postprandial high sugars  She did however increase her Lantus as directed, not clear if this has improved her control even overnight Minimal hypoglycemia present    CGM data for the last 2 weeks from freestyle libre  CGM use  % of time   2-week average/GV   Time in range     36   % was 33  % Time Above 180/250 36/28  % Time above 250   % Time Below 70 0     PRE-MEAL Fasting Lunch Dinner Bedtime Overall  Glucose range:       Averages: 174 193  232 208   POST-MEAL PC Breakfast PC Lunch PC Dinner  Glucose range:     Averages: 204 232 245    Previous data:  CGM use % of time 83  2-week average/GV 210  Time in range 33       %  % Time Above 180 43  % Time above 250 24  % Time Below 70 0     PRE-MEAL Fasting Lunch Dinner Bedtime Overall  Glucose range:       Averages: 174 191 227 212 210   POST-MEAL PC Breakfast PC Lunch PC Dinner  Glucose range:     Averages: 225 206 236      Glycemic patterns summary: Overall blood sugars are highly variable and persistently high, on an average blood sugars are above 180 target throughout the day and night   Dietician visit: Most recent: 04/2010 Weight control:  Wt Readings from Last 3 Encounters:  01/20/21 270 lb 6.4 oz (122.7 kg)  01/07/21 265 lb (120.2 kg)  12/28/20 267 lb (121.1 kg)         Diabetes labs:  Lab Results  Component Value Date  HGBA1C 9.3 (H) 11/30/2020   HGBA1C 9.0 (H) 07/26/2020   HGBA1C 8.8 (H) 05/24/2020   Lab Results  Component Value Date   MICROALBUR <0.7 08/25/2020   LDLCALC 52 10/06/2020   CREATININE 0.98 01/18/2021   Other active medical problems including recent evaluation of thyroid: See review of systems   No visits with results within 1 Day(s) from this visit.  Latest known visit with results is:  Lab on 01/18/2021  Component Date Value Ref Range Status   Sodium 01/18/2021 141  135 - 145 mEq/L Final   Potassium 01/18/2021 4.4  3.5 - 5.1 mEq/L Final   Chloride 01/18/2021 103  96 - 112 mEq/L Final   CO2 01/18/2021 32  19 - 32 mEq/L Final   Glucose, Bld 01/18/2021 154 (A) 70 - 99 mg/dL Final   BUN 01/18/2021 15  6 - 23 mg/dL Final   Creatinine, Ser 01/18/2021 0.98  0.40 - 1.20 mg/dL Final   GFR 01/18/2021  61.66  >60.00 mL/min Final   Calculated using the CKD-EPI Creatinine Equation (2021)   Calcium 01/18/2021 9.4  8.4 - 10.5 mg/dL Final   T3, Free 01/18/2021 3.1  2.3 - 4.2 pg/mL Final   Free T4 01/18/2021 0.80  0.60 - 1.60 ng/dL Final   Comment: Specimens from patients who are undergoing biotin therapy and /or ingesting biotin supplements may contain high levels of biotin.  The higher biotin concentration in these specimens interferes with this Free T4 assay.  Specimens that contain high levels  of biotin may cause false high results for this Free T4 assay.  Please interpret results in light of the total clinical presentation of the patient.     TSH 01/18/2021 1.26  0.35 - 5.50 uIU/mL Final     Allergies as of 01/20/2021       Reactions   Hydrocodone Other (See Comments)   Severe anxiety Confusion   Invokana [canagliflozin] Other (See Comments)   Constant yeast infections   Amlodipine Other (See Comments)   Muscles hurt   Carvedilol Other (See Comments)   Wheezing with 25mg  BID dosing NOT ALLERGIC 10/08/20   Chlorthalidone Other (See Comments)   Leg pain   Evolocumab Other (See Comments)   Injection site pain   Hydralazine Other (See Comments)   Fatigue   Imdur [isosorbide Nitrate] Other (See Comments)   Patient reported wheezing   Jardiance [empagliflozin] Other (See Comments)   lightheadedness, weakness, low BP and yeast infection.  NOT ALLERGIC 10/08/20   Rosuvastatin Other (See Comments)   myalgias   Simvastatin Other (See Comments)   myalgias   Spironolactone Other (See Comments)   Wheezing vs fatigue?   Atorvastatin Other (See Comments)   DIZZINESS   Compazine [prochlorperazine] Anxiety        Medication List        Accurate as of January 20, 2021 10:40 AM. If you have any questions, ask your nurse or doctor.          Advair HFA 115-21 MCG/ACT inhaler Generic drug: fluticasone-salmeterol as needed.   albuterol 108 (90 Base) MCG/ACT inhaler Commonly  known as: VENTOLIN HFA SMARTSIG:1-2 Puff(s) By Mouth Every 4 Hours PRN   ALPRAZOLAM PO Take 12.5 mg by mouth 2 (two) times daily as needed (anxiety attack).   ALPRAZolam 0.25 MG tablet Commonly known as: XANAX Take 0.25 mg by mouth as needed.   aspirin EC 81 MG tablet Take 1 tablet (81 mg total) by mouth daily.   Coenzyme Q10 100 MG  capsule Take 100 mg by mouth daily.   FISH OIL OMEGA-3 PO Take 1 tablet by mouth daily.   fluconazole 150 MG tablet Commonly known as: DIFLUCAN Take 150 mg by mouth as needed.   FreeStyle Libre 2 Reader [place] Use reader to monitor blood sugars continuously.   FreeStyle Libre 2 Sensor Misc USE 1 SENSOR ONCE EVERY 14  DAYS TO MONITOR BLOOD  SUGARS CONTINUOUSLY   FREESTYLE TEST STRIPS test strip Generic drug: glucose blood Use as instructed   furosemide 20 MG tablet Commonly known as: LASIX Take 1 tablet (20 mg total) by mouth daily. For 3 days then take as need or fluid and swelling   HumaLOG 100 UNIT/ML cartridge Generic drug: insulin lispro INECT 3-25 UNITS INTO THE SKIN THREE TIMES DAILY WITH MEALS. USE PER SLIDING SCALE   hydrALAZINE 10 MG tablet Commonly known as: APRESOLINE Take 10 mg by mouth as needed.   hydrOXYzine 25 MG tablet Commonly known as: ATARAX/VISTARIL Take 1 tablet (25 mg total) by mouth 3 (three) times daily as needed for anxiety.   InPen 100-Blue-Lilly Devi Generic drug: injection device for insulin CONSULT MD FOR APP SETTINGS.   Lantus SoloStar 100 UNIT/ML Solostar Pen Generic drug: insulin glargine INJECT SUBCUTANEOUSLY 70  UNITS DAILY   methimazole 5 MG tablet Commonly known as: TAPAZOLE Take by mouth daily.   metoprolol succinate 50 MG 24 hr tablet Commonly known as: TOPROL-XL Take 1 tablet (50 mg total) by mouth daily. Take with or immediately following a meal.   nitroGLYCERIN 0.4 MG SL tablet Commonly known as: NITROSTAT DISSOLVE ONE TABLET UNDER TONGUE AS NEEDED FOR CHEST PAIN   Ozempic (0.25  or 0.5 MG/DOSE) 2 MG/1.5ML Sopn Generic drug: Semaglutide(0.25 or 0.5MG /DOS) Inject 0.5 mg into the skin once a week.   pantoprazole 40 MG tablet Commonly known as: PROTONIX Take 40 mg by mouth daily.   Praluent 75 MG/ML Soaj Generic drug: Alirocumab INJECT 1 PEN INTO THE SKIN EVERY 14 DAYS   sacubitril-valsartan 97-103 MG Commonly known as: ENTRESTO Take 1 tablet by mouth 2 (two) times daily.   valACYclovir 1000 MG tablet Commonly known as: VALTREX Take 1,000 mg by mouth 2 (two) times daily as needed (infection).   venlafaxine 75 MG tablet Commonly known as: EFFEXOR Take 1 tablet (75 mg total) by mouth 2 (two) times daily.        Allergies:  Allergies  Allergen Reactions   Hydrocodone Other (See Comments)    Severe anxiety Confusion    Invokana [Canagliflozin] Other (See Comments)    Constant yeast infections   Amlodipine Other (See Comments)    Muscles hurt   Carvedilol Other (See Comments)    Wheezing with 25mg  BID dosing NOT ALLERGIC 10/08/20   Chlorthalidone Other (See Comments)    Leg pain   Evolocumab Other (See Comments)    Injection site pain   Hydralazine Other (See Comments)    Fatigue   Imdur [Isosorbide Nitrate] Other (See Comments)    Patient reported wheezing   Jardiance [Empagliflozin] Other (See Comments)    lightheadedness, weakness, low BP and yeast infection.  NOT ALLERGIC 10/08/20   Rosuvastatin Other (See Comments)    myalgias   Simvastatin Other (See Comments)    myalgias   Spironolactone Other (See Comments)    Wheezing vs fatigue?   Atorvastatin Other (See Comments)    DIZZINESS   Compazine [Prochlorperazine] Anxiety    Past Medical History:  Diagnosis Date   Anemia    Anxiety  CAD in native artery    a. Inf STEMI 04/1912 complicated by cardiogenic shock/complete heart block s/p emergent cath showing culprit large dominant LCx s/p aspiration thrombectomy and DES, EF 50% by cath, 40-45% by echo.   Chronic systolic heart  failure ([place]) 06/25/2017   Echo 11/19: mild LVH, EF 40-45, inf-lat HK, Gr 1 DD, trivial MR   Complete heart block, transient ([place])    a. 06/2017 in setting of acute inf MI -> resolved after PCI.   COVID-19    Diabetes mellitus    Goiter    History of radiation therapy 06/14/2016 - 07/19/2016   Right Breast 50 Gy 25 fractions   Hypercholesteremia    Hypertension    Ischemic cardiomyopathy    a. EF 50% by cath and 40-45% by echo 06/2017.   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   [facility])    Nuclear stress test    Nuclear stress test 10/19: EF 35, inferolateral, apical scar; inferior, apical inferior infarct with mild peri-infarct ischemia; high risk   Obesity    Personal history of chemotherapy    Personal history of radiation therapy    Pituitary tumor    Seasonal asthma    Sleep apnea    does not use every night   Stroke [facility])    TIA - Dec 1 st, 2017    Past Surgical History:  Procedure Laterality Date   ABDOMINAL HYSTERECTOMY     BRAIN MENINGIOMA EXCISION  2005   BREAST BIOPSY     BREAST LUMPECTOMY Right    2017   BREAST LUMPECTOMY WITH RADIOACTIVE SEED LOCALIZATION Right 04/20/2016   Procedure: RIGHT BREAST LUMPECTOMY WITH RADIOACTIVE SEED LOCALIZATION;  Surgeon: Fanny Skates, MD;  Location: [facility];  Service: General;  Laterality: Right;   BREAST REDUCTION SURGERY Bilateral 10/23/2016   Procedure: BILATERAL BREAST REDUCTION WITH LIPOSUCTION ASSISTANCE;  Surgeon: Cristine Polio, MD;  Location: [place];  Service: Plastics;  Laterality: Bilateral;   CESAREAN SECTION     CORONARY STENT INTERVENTION N/A 06/25/2017   Procedure: CORONARY STENT INTERVENTION;  Surgeon: Sherren Mocha, MD;  Location: [place] [facility];  Service: Cardiovascular;  Laterality: N/A;   CORONARY/GRAFT ACUTE MI REVASCULARIZATION N/A 06/25/2017   Procedure:  Coronary/Graft Acute MI Revascularization;  Surgeon: Sherren Mocha, MD;  Location: [place] [facility];  Service: Cardiovascular;  Laterality: N/A;   FOOT SURGERY Bilateral 2016   hammer toe surgery   LEFT HEART CATH AND CORONARY ANGIOGRAPHY N/A 06/25/2017   Procedure: LEFT HEART CATH AND CORONARY ANGIOGRAPHY;  Surgeon: Sherren Mocha, MD;  Location: [place] [facility];  Service: Cardiovascular;  Laterality: N/A;   LEFT HEART CATH AND CORONARY ANGIOGRAPHY N/A 05/13/2020   Procedure: LEFT HEART CATH AND CORONARY ANGIOGRAPHY;  Surgeon: Lorretta Harp, MD;  Location: [place] [facility];  Service: Cardiovascular;  Laterality: N/A;   MENISCUS REPAIR Left 2015   PITUITARY SURGERY     REDUCTION MAMMAPLASTY     RIGHT HEART CATH N/A 06/25/2017   Procedure: RIGHT HEART CATH;  Surgeon: Sherren Mocha, MD;  Location: [place] [facility];  Service: Cardiovascular;  Laterality: N/A;   THYROID SURGERY      Family History  Problem Relation Age of Onset   Diabetes Mother    Hyperlipidemia Mother    Hypertension Mother    Alcohol abuse Brother  Social History:  reports that she has never smoked. She has never used smokeless tobacco. She reports current alcohol use of about 1.0 standard drink per week. She reports that she does not use drugs.  Review of Systems:  THYROID: Because of her anxiety and panic attacks her PCP was evaluating her thyroid levels However only her free T3 level was higher than normal once but no other abnormalities including persistently normal TSH levels She says she has a history of thyroid surgery remotely  Although she had been on the Tapazole 10 mg daily from her PCP this was stopped and thyroid levels are still normal   Lab Results  Component Value Date   TSH 1.26 01/18/2021   TSH 3.57 11/30/2020   TSH 1.295 10/08/2020   FREET4 0.80 01/18/2021   FREET4 0.62 11/30/2020   Free T3 level normal at 3.5 Has been Hypertension: Well-controlled, blood pressure is  managed by cardiologist with readings as follows:  She is taking Entresto and metoprolol 25 mg as also hydralazine, previously on Avapro and carvedilol  BP Readings from Last 3 Encounters:  01/20/21 (!) 162/104  01/07/21 (!) 144/80  12/28/20 (!) 166/88     Lipids: She has been on Praluent from cardiologist, could not tolerate Repatha  Followed by cardiology  She does have a history of MI  Lab Results  Component Value Date   CHOL 120 10/06/2020   HDL 54 10/06/2020   LDLCALC 52 10/06/2020   LDLDIRECT 67 07/15/2020   TRIG 63 10/06/2020   CHOLHDL 2.2 10/06/2020   Lab Results  Component Value Date   ALT 24 11/08/2020    Diabetic shoe prescription has been given previously  She reportedly has had surgery for pituitary tumor but last MRI only shows a 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   No ankle edema  ASSESSMENT/ PLAN:    Diabetes type 2 with obesity, hypertension and hyperlipidemia, BMI more than 35  See history of present illness for detailed discussion of current management, interpretation of her continuous glucose monitoring record, blood sugar patterns and problems identified  Her A1c has been consistently high and last 9.3  As above she has not benefited from increasing her Lantus or starting low-dose Ozempic  She has difficulty monitoring her food intake and carbohydrates and snacks  Recently because of her surgical procedure she has not exercised  Also she has lowered her carbohydrate coverage to 1: 9 instead of 6 and not clear why  She likely will do better with the insulin pump but is still waiting for approval   Recommendations today:    She will increase Lantus to 55 in the morning also  If her morning sugars start getting low she will reduce her evening dose  Since she has a large  supply of Lantus at home will not change the basal insulin as yet Carbohydrate coverage back to 1: 6 as before Correction factor I: 15  Make sure she takes her mealtime insulin at the start of each meal consistently Avoid additional doses postprandially as this seems to make overcorrection Switch OZEMPIC to [facility] when finished and start with 5 mg weekly for 1 prescription and then 7.5 mg.  She will call to let us know if  she is tolerating 5 mg Demonstrated the injection device for [facility] and explained how this works as well as patient information given with co-pay card Regular exercise to be started  THYROID: No further management needed  Patient Instructions  Lantus 55 twice daily  Carb ratio 1:6  Take 2 shots of Ozempic 0.5mg  at same time  Mounjaro 5mg  weekly for 4 shots and call for 7.5mg  weekly  Exercise ! Elayne Snare 01/20/2021, 10:40 AM   This visit occurred during the SARS-CoV-2 public health emergency.  Safety protocols were in place, including screening questions prior to the visit, additional usage of staff PPE, and extensive cleaning of exam room while observing appropriate contact time as indicated for disinfecting solutions.

## 2021-01-21 NOTE — Psych (Signed)
Virtual Visit via Video Note  I connected with Destiny Beasley on 12/06/20 at 09:00 AM EDT by a video enabled telemedicine application and verified that I am speaking with the correct person using two identifiers.  Location: Patient: patient home Provider: clinical home office   I discussed the limitations of evaluation and management by telemedicine and the availability of in person appointments. The patient expressed understanding and agreed to proceed.  I discussed the assessment and treatment plan with the patient. The patient was provided an opportunity to ask questions and all were answered. The patient agreed with the plan and demonstrated an understanding of the instructions.   The patient was advised to call back or seek an in-person evaluation if the symptoms worsen or if the condition fails to improve as anticipated.  Pt was provided 240 minutes of non-face-to-face time during this encounter.   [name], LCSW    [facility] PHP THERAPIST PROGRESS NOTE  MAYGAN KOELLER [id number]  Session Time: 9:00 - 10:00  Participation Level: Active  Behavioral Response: CasualAlertAnxious  Type of Therapy: Group Therapy  Treatment Goals addressed: Coping  Interventions: CBT, DBT, Supportive, and Reframing  Summary: Clinician led check-in regarding current stressors and situation. Clinician utilized active listening and empathetic response and validated patient emotions. Clinician facilitated processing group on pertinent issues.   Therapist Response: Destiny Beasley is a 65 y.o. female who presents with panic disorder. Patient arrived within time allowed and reports that she is feeling "good." Patient rates her mood at a 9 on a scale of 1-10 with 10 being great. Pt reports she was in social situations over the weekend and she was able to manage her anxiety. Pt states variable sleep over the weekend. Pt able to process. Pt engaged in discussion.         Session Time: 10:00 -  11:00   Participation Level: Active   Behavioral Response: CasualAlertDepressed   Type of Therapy: Group Therapy   Treatment Goals addressed: Coping   Interventions: CBT, DBT, Supportive and Reframing   Summary: Cln led processing group for pt's current struggles. Group members shared stressors and provided support and feedback. Cln brought in topics of boundaries, healthy relationships, and unhealthy thought processes to inform discussion.      Therapist Response: Pt able to process and provide support to group.           Session Time: 11:00 - 12:00   Participation Level: Active   Behavioral Response: CasualAlertDepressed   Type of Therapy: Group Therapy   Treatment Goals addressed: Coping   Interventions: CBT, DBT, Supportive and Reframing   Summary: Cln led discussion on setting boundaries as a way to increase self-care. Group members discussed things that are stumbling blocks to them engaging in self-care and worked to determine what boundary could address that stumbling block. Group worked together to determine how to address the boundary. Cln brought in topics of boundaries, assertiveness, thought challenging, and self-care.      Therapist Response: Pt engaged in discussion and identifies a boundary that can improve their self-care.            Session Time: 12:00 -1:00   Participation Level: Active   Behavioral Response: CasualAlertDepressed   Type of Therapy: Group therapy   Treatment Goals addressed: Coping   Interventions: Psychosocial skills training, Supportive   Summary: 12:00 - 12:50: Occupational Therapy group 12:50 -1:00 Clinician led check-out. Clinician assessed for immediate needs, medication compliance and efficacy, and safety concerns  Therapist Response: 12:00 - 12:50: Patient engaged in group. See OT note.  12:50 - 1:00: At check-out, patient rates her mood at a 9 on a scale of 1-10 with 10 being great. Pt reports afternoon plans of going  to the dentist. Pt demonstrates some progress as evidenced by increase in leaving the house. Patient denies SI/HI at the end of group.    Suicidal/Homicidal: Nowithout intent/plan  Plan: Pt will continue in PHP while working to decrease panic attacks and increase ability to manage symptoms in a healthy manner.   Diagnosis: Panic disorder [F41.0]    1. Panic disorder       [name], [place] 01/21/2021

## 2021-01-21 NOTE — Psych (Signed)
Virtual Visit via Video Note  I connected with Destiny Beasley on 12/08/20 at 09:00 AM EDT by a video enabled telemedicine application and verified that I am speaking with the correct person using two identifiers.  Location: Patient: patient home Provider: clinical home office   I discussed the limitations of evaluation and management by telemedicine and the availability of in person appointments. The patient expressed understanding and agreed to proceed.  I discussed the assessment and treatment plan with the patient. The patient was provided an opportunity to ask questions and all were answered. The patient agreed with the plan and demonstrated an understanding of the instructions.   The patient was advised to call back or seek an in-person evaluation if the symptoms worsen or if the condition fails to improve as anticipated.  Pt was provided 240 minutes of non-face-to-face time during this encounter.   [name], LCSW    [facility] PHP THERAPIST PROGRESS NOTE  Destiny Beasley [id number]  Session Time: 9:00 - 10:00  Participation Level: Active  Behavioral Response: CasualAlertAnxious  Type of Therapy: Group Therapy  Treatment Goals addressed: Coping  Interventions: CBT, DBT, Supportive, and Reframing  Summary: Clinician led check-in regarding current stressors and situation. Clinician utilized active listening and empathetic response and validated patient emotions. Clinician facilitated processing group on pertinent issues.   Therapist Response: Destiny Beasley is a 65 y.o. female who presents with panic disorder. Patient arrived within time allowed and reports that she is feeling "great." Patient rates her mood at a 10 on a scale of 1-10 with 10 being great. Pt reports she spent time with a friend yesterday. Pt states applying communication strategies with another friend with some success. Pt reports decreased panic attacks. Pt able to process. Pt engaged in discussion.          Session Time: 10:00 - 11:00   Participation Level: Active   Behavioral Response: CasualAlertDepressed   Type of Therapy: Group Therapy   Treatment Goals addressed: Coping   Interventions: CBT, DBT, Supportive and Reframing   Summary: Cln led processing group for pt's current struggles. Group members shared stressors and provided support and feedback. Cln brought in topics of boundaries, healthy relationships, and unhealthy thought processes to inform discussion.      Therapist Response: Pt able to process and provide support to group.           Session Time: 11:00 - 12:00   Participation Level: Active   Behavioral Response: CasualAlertDepressed   Type of Therapy: Group Therapy   Treatment Goals addressed: Coping   Interventions: CBT, DBT, Supportive and Reframing   Summary: Nutrition group with dietician K. Watts. Group discussed how nutrition can support our mental health.      Therapist Response: Pt engaged in discussion.            Session Time: 12:00 -1:00   Participation Level: Active   Behavioral Response: CasualAlertDepressed   Type of Therapy: Group therapy   Treatment Goals addressed: Coping   Interventions: Psychosocial skills training, Supportive   Summary: 12:00 - 12:50: Occupational Therapy group 12:50 -1:00 Clinician led check-out. Clinician assessed for immediate needs, medication compliance and efficacy, and safety concerns   Therapist Response: 12:00 - 12:50: Patient engaged in group. See OT note.  12:50 - 1:00: At check-out, patient rates her mood at a 10 on a scale of 1-10 with 10 being great. Pt reports afternoon plans of helping a friend. Pt demonstrates some progress as evidenced by reporting  improved ability to manage panic attacks. Patient denies SI/HI at the end of group.    Suicidal/Homicidal: Nowithout intent/plan  Plan: Pt will continue in PHP while working to decrease panic attacks and increase ability to manage  symptoms in a healthy manner.   Diagnosis: Panic disorder [F41.0]    1. Panic disorder       [name], [place] 01/21/2021

## 2021-01-21 NOTE — Psych (Signed)
Virtual Visit via Video Note  I connected with Destiny Beasley on 12/07/20 at 09:00 AM EDT by a video enabled telemedicine application and verified that I am speaking with the correct person using two identifiers.  Location: Patient: patient home Provider: clinical home office   I discussed the limitations of evaluation and management by telemedicine and the availability of in person appointments. The patient expressed understanding and agreed to proceed.  I discussed the assessment and treatment plan with the patient. The patient was provided an opportunity to ask questions and all were answered. The patient agreed with the plan and demonstrated an understanding of the instructions.   The patient was advised to call back or seek an in-person evaluation if the symptoms worsen or if the condition fails to improve as anticipated.  Pt was provided 240 minutes of non-face-to-face time during this encounter.   [name], LCSW    [facility] PHP THERAPIST PROGRESS NOTE  Destiny Beasley [id number]  Session Time: 9:00 - 10:00  Participation Level: Active  Behavioral Response: CasualAlertAnxious  Type of Therapy: Group Therapy  Treatment Goals addressed: Coping  Interventions: CBT, DBT, Supportive, and Reframing  Summary: Clinician led check-in regarding current stressors and situation. Clinician utilized active listening and empathetic response and validated patient emotions. Clinician facilitated processing group on pertinent issues.   Therapist Response: Destiny Beasley is a 65 y.o. female who presents with panic disorder. Patient arrived within time allowed and reports that she is feeling "good." Patient rates her mood at a 10 on a scale of 1-10 with 10 being great. Pt reports she had a tooth removed yesterday and pain she was dealing with is gone. Pt states she rested after the appt. Pt reports improved mood after her dental appt went well. Pt able to process. Pt engaged in  discussion.         Session Time: 10:00 - 11:00   Participation Level: Active   Behavioral Response: CasualAlertDepressed   Type of Therapy: Group Therapy   Treatment Goals addressed: Coping   Interventions: CBT, DBT, Supportive and Reframing   Summary: Cln led processing group for pt's current struggles. Group members shared stressors and provided support and feedback. Cln brought in topics of boundaries, healthy relationships, and unhealthy thought processes to inform discussion.      Therapist Response: Pt able to process and provide support to group.           Session Time: 11:00 - 12:00   Participation Level: Active   Behavioral Response: CasualAlertDepressed   Type of Therapy: Group Therapy   Treatment Goals addressed: Coping   Interventions: CBT, DBT, Supportive and Reframing   Summary: Cln led discussion on communicating our struggles with our support team. Cln introduced the concept of giving disclosures to the people close to [place] regarding the way we act in specific situations or the way we process certain stimuli. Group members discussed ways to provide this "road map to our brain" and in what situations this may be helpful to them      Therapist Response: Pt engaged in discussion and is able to determine situations in which providing a disclosure can be helpful and practiced doing so.             Session Time: 12:00 -1:00   Participation Level: Active   Behavioral Response: CasualAlertDepressed   Type of Therapy: Group therapy   Treatment Goals addressed: Coping   Interventions: Psychosocial skills training, Supportive   Summary: 12:00 -  12:50: Occupational Therapy group 12:50 -1:00 Clinician led check-out. Clinician assessed for immediate needs, medication compliance and efficacy, and safety concerns   Therapist Response: 12:00 - 12:50: Patient engaged in group. See OT note.  12:50 - 1:00: At check-out, patient rates her mood at a 10 on a  scale of 1-10 with 10 being great. Pt reports afternoon plans of running errands. Pt demonstrates some progress as evidenced by improving sleep. Patient denies SI/HI at the end of group.    Suicidal/Homicidal: Nowithout intent/plan  Plan: Pt will continue in PHP while working to decrease panic attacks and increase ability to manage symptoms in a healthy manner.   Diagnosis: Panic disorder [F41.0]    1. Panic disorder       [name], [place] 01/21/2021

## 2021-01-21 NOTE — Psych (Signed)
Virtual Visit via Video Note  I connected with Destiny Beasley on 12/09/20 at 09:00 AM EDT by a video enabled telemedicine application and verified that I am speaking with the correct person using two identifiers.  Location: Patient: patient home Provider: clinical home office   I discussed the limitations of evaluation and management by telemedicine and the availability of in person appointments. The patient expressed understanding and agreed to proceed.  I discussed the assessment and treatment plan with the patient. The patient was provided an opportunity to ask questions and all were answered. The patient agreed with the plan and demonstrated an understanding of the instructions.   The patient was advised to call back or seek an in-person evaluation if the symptoms worsen or if the condition fails to improve as anticipated.  Pt was provided 240 minutes of non-face-to-face time during this encounter.   [name], LCSW    [facility] PHP THERAPIST PROGRESS NOTE  Destiny DEGRAFFENREID [id number]  Session Time: 9:00 - 10:00  Participation Level: Active  Behavioral Response: CasualAlertAnxious  Type of Therapy: Group Therapy  Treatment Goals addressed: Coping  Interventions: CBT, DBT, Supportive, and Reframing  Summary: Clinician led check-in regarding current stressors and situation. Clinician utilized active listening and empathetic response and validated patient emotions. Clinician facilitated processing group on pertinent issues.   Therapist Response: Destiny Beasley is a 65 y.o. female who presents with panic disorder. Patient arrived within time allowed and reports that she is feeling "pretty good." Patient rates her mood at a 9 on a scale of 1-10 with 10 being great. Pt reports she got out of the house yesterday and saw a friend. Pt reports increased comfort in taking PRN medication to manage her anxiety. Pt able to process. Pt engaged in discussion.         Session Time:  10:00 - 11:00   Participation Level: Active   Behavioral Response: CasualAlertDepressed   Type of Therapy: Group Therapy   Treatment Goals addressed: Coping   Interventions: CBT, DBT, Supportive and Reframing   Summary: Cln led discussion on healthy relationships. Group members shared issues they have experienced in past relationships and in identifying what "healthy" looks like. Cln discussed respect, trust, and honesty as non-negotiable traits in a healthy dynamic. Group shared and problem solved barriers to recognizing and priortizing these traits.      Therapist Response: Pt engaged in discussion and is able to process.           Session Time: 11:00- 12:00   Participation Level: Active   Behavioral Response: CasualAlertDepressed   Type of Therapy: Group Therapy   Treatment Goals addressed: Coping   Interventions: CBT, DBT, Supportive and Reframing   Summary: Cln introduced topic of CBT cognitive distortions. Cln discussed unhealthy thought patterns and how our thoughts shape our reality and irrational thoughts can alter our perspective. Cln utilized handout "cognitive distortions" to discuss common examples of distorted thoughts and group members worked to identify examples in their own life.      Therapist Response: Pt engaged in discussion and is able to determine examples of distorted thinking in their own life.         Session Time: 12:00 -1:00   Participation Level: Active   Behavioral Response: CasualAlertDepressed   Type of Therapy: Group therapy   Treatment Goals addressed: Coping   Interventions: CBT; Solution focused; Supportive; Reframing   Summary: 12:00 - 12:50: Cln continued topic of CBT cognitive distortions and utilized handout "Unhealthy  Thought Patterns"to review common examples of distorted thought to increase awareness of the distorted thoughts. 12:50 -1:00 Clinician led check-out. Clinician assessed for immediate needs, medication compliance  and efficacy, and safety concerns   Therapist Response: 12:50 - 1:00: Pt engaged in discussion and is able to make connections.  12:50 - 1:00: At check-out, patient rates her mood at a 10 on a scale of 1-10 with 10 being great. Pt reports afternoon plans of going to the grocery store. Pt demonstrates some progress as evidenced by improved ability to utilize coping skills. Patient denies SI/HI at the end of group.    Suicidal/Homicidal: Nowithout intent/plan  Plan: Pt will continue in PHP while working to decrease panic attacks and increase ability to manage symptoms in a healthy manner.   Diagnosis: Panic disorder [F41.0]    1. Panic disorder       [name], [place] 01/21/2021

## 2021-01-21 NOTE — Psych (Signed)
Virtual Visit via Video Note  I connected with Destiny Beasley on 12/10/20 at 09:00 AM EDT by a video enabled telemedicine application and verified that I am speaking with the correct person using two identifiers.  Location: Patient: patient home Provider: clinical home office   I discussed the limitations of evaluation and management by telemedicine and the availability of in person appointments. The patient expressed understanding and agreed to proceed.  I discussed the assessment and treatment plan with the patient. The patient was provided an opportunity to ask questions and all were answered. The patient agreed with the plan and demonstrated an understanding of the instructions.   The patient was advised to call back or seek an in-person evaluation if the symptoms worsen or if the condition fails to improve as anticipated.  Pt was provided 240 minutes of non-face-to-face time during this encounter.   [name], LCSW    [facility] PHP THERAPIST PROGRESS NOTE  Destiny Beasley [id number]  Session Time: 9:00 - 10:00  Participation Level: Active  Behavioral Response: CasualAlertAnxious  Type of Therapy: Group Therapy  Treatment Goals addressed: Coping  Interventions: CBT, DBT, Supportive, and Reframing  Summary: Clinician led check-in regarding current stressors and situation. Clinician utilized active listening and empathetic response and validated patient emotions. Clinician facilitated processing group on pertinent issues.   Therapist Response: SELENI MELLER is a 65 y.o. female who presents with panic disorder. Patient arrived within time allowed and reports that she is feeling "anxious." Patient rates her mood at a 6 on a scale of 1-10 with 10 being great. Pt reports she woke up with increased anxiety and nausea with no trigger. Pt repots increased stress yesterday trying to plan a party for her daughter. Pt able to process. Pt engaged in discussion.         Session  Time: 10:00 - 11:00   Participation Level: Active   Behavioral Response: CasualAlertDepressed   Type of Therapy: Group Therapy   Treatment Goals addressed: Coping   Interventions: CBT, DBT, Supportive and Reframing   Summary: Cln continued discussion on topic of boundaries. Group reviewed previous aspects of boundaries discussed. Cln utilized handout "Tips for [employer]" and group members discussed how to apply the tips. Cln shaped conversation and cued for healthy boundary characteristics.      Therapist Response: Pt engaged in discussion and is able to process ways to increase their healthy boundaries.            Session Time: 11:00- 12:00   Participation Level: Active   Behavioral Response: CasualAlertDepressed   Type of Therapy: Group Therapy   Treatment Goals addressed: Coping   Interventions: CBT, DBT, Supportive and Reframing   Summary: Cln continued topic of CBT cognitive distortions and introduced thought challenging as a way to  utilize the "challenge" C in C-C-C. Group utilized Web designer questions" as a way to introduce challenges and reframe distorted thinking. Group members worked through pt examples to practice challenging distorted thinking.      Therapist Response: Pt engaged in discussion and demonstrates understanding of challenging distorted thoughts through practice.         Session Time: 12:00 -1:00   Participation Level: Active   Behavioral Response: CasualAlertDepressed   Type of Therapy: Group therapy   Treatment Goals addressed: Coping   Interventions: CBT; Solution focused; Supportive; Reframing   Summary: 12:00 - 12:50: Cln introduced topic of positive psychology. Group discussed the 5 ways to train your brain to scan for  the positive: conscious acts of kindness, meditation, exercise, postive event journaling, and gratitudes. Group members discussed how to apply the principles in their every day life.  12:50 -1:00  Clinician led check-out. Clinician assessed for immediate needs, medication compliance and efficacy, and safety concerns   Therapist Response: 12:50 - 1:00: Pt engaged in discussion and reports she can start by utilizing gratitudes.  12:50 - 1:00: At check-out, patient rates her mood at a 8 on a scale of 1-10 with 10 being great. Pt reports afternoon plans of going to an appt. Pt demonstrates some progress as evidenced by increased ability to manage symptoms. Patient denies SI/HI at the end of group.    Suicidal/Homicidal: Nowithout intent/plan  Plan: Pt will discharge from PHP due to meeting treatent goals of decreased panic attacks and increased ability to manage symptoms in a healthy manner. Pt will step down to IOP within this agency on 8/22. Pt and provider are aligned with discharge. Pt denies SI/HI at time of discharge.    Diagnosis: Panic disorder [F41.0]    1. Panic disorder   2. Difficulty coping       [name], LCSW 01/21/2021

## 2021-01-24 ENCOUNTER — Telehealth: Payer: Self-pay | Admitting: Endocrinology

## 2021-01-24 NOTE — Telephone Encounter (Signed)
Anderson Malta from [employer]. Called to let us know that Medication has been approved.

## 2021-01-25 ENCOUNTER — Other Ambulatory Visit (HOSPITAL_COMMUNITY): Payer: Self-pay

## 2021-01-25 ENCOUNTER — Other Ambulatory Visit: Payer: Self-pay

## 2021-01-25 MED ORDER — PRALUENT 75 MG/ML ~~LOC~~ SOAJ: SUBCUTANEOUS | 3 refills | Status: DC

## 2021-01-25 NOTE — Telephone Encounter (Signed)
[facility] Patient Advocate Encounter  Prior Authorization for Destiny Beasley has been approved.    Patients co-pay is $0.   Spoke with the pharmacy and they will order for the pt.

## 2021-01-25 NOTE — Telephone Encounter (Signed)
Called to inform patient.

## 2021-01-26 NOTE — Progress Notes (Unsigned)
Patient ID: Destiny Beasley                 DOB: 03-Aug-1955                      MRN: [id number]     HPI: Destiny Beasley is a 65 y.o. female referred by Dr. Richardson Dopp to HTN clinic for . PMH is significant for CAD s/p STEMI in 1660, systolic CHF w/ EF recovered to 40-45% (5/20), T2DM, prior TIA, HTN, and HLD. Pt has previously followed with PharmD team for cholesterol management for which she currently takes Praluent injections.   Patient was last seen 2 weeks ago for HTN/HF management by cardiology. And a month ago at by CPP. Patient uses inaccurate wrist cuff and was unable to determine accuracy of home cuff at eithre visit although BP at home appears better than in office. Two weeks ago, patient increased on 9/16 to max dose entresto 97-103 mg BID from 49-51 mg BID following normal renal fx and potassium labs due to not being at goal >130/80. Patient reports on 9/21 via telephone note her leg muscles started hurting, and she was fatigued/light headed and went back on the 49-51 mg dose  She also has a history of multiple drug intolerances and self-adjusting medications. Patient reports at visit one month ago taking hydralazine in case of emergency only.  Upcoming trip to [place] this month.   Current HTN meds: metoprolol 50 mg succinate daily (AM), Entresto 97-103 mg twice a day (taking 49-51 mg?) Previously tried: spironolactone 25 mg daily (wheezing vs. fatigue), hydralazine 25, 50 mg TID (fatigue), Imdur 30 mg daily (wheezing), Entresto (?), Invokana (frequent yeast infections), carvedilol 25mg  BID (wheezing), amlodipine 10 mg daily (makes her muscles hurt), chlorthalidone 12.5mg  (leg pain?), Jardiance (yeast infections) BP goal: <130/80 mmHg  Family History: Mother (HTN, HLD, T2DM)  Social History: never smoked, + EtOH  Diet:   -delete- (Last visit - drinks: water, coffee (2 cups), Gatorade zero Fresh or frozen vegetables)  Exercise:  -delete- (Last visit - 20 min walking on  treadmill 5 times/week)  Home BP readings:   -delete- (Last visit - 147/91, 160/100s)  Wt Readings from Last 3 Encounters:  01/20/21 270 lb 6.4 oz (122.7 kg)  01/07/21 265 lb (120.2 kg)  12/28/20 267 lb (121.1 kg)   BP Readings from Last 3 Encounters:  01/20/21 (!) 162/104  01/07/21 (!) 144/80  12/28/20 (!) 166/88   Pulse Readings from Last 3 Encounters:  01/20/21 62  01/07/21 80  12/28/20 80    Renal function: Estimated Creatinine Clearance: 86.2 mL/min (by C-G formula based on SCr of 0.98 mg/dL).  Past Medical History:  Diagnosis Date   Anemia    Anxiety    CAD in native artery    a. Inf STEMI 09/3014 complicated by cardiogenic shock/complete heart block s/p emergent cath showing culprit large dominant LCx s/p aspiration thrombectomy and DES, EF 50% by cath, 40-45% by echo.   Chronic systolic heart failure ([place]) 06/25/2017   Echo 11/19: mild LVH, EF 40-45, inf-lat HK, Gr 1 DD, trivial MR   Complete heart block, transient ([place])    a. 06/2017 in setting of acute inf MI -> resolved after PCI.   COVID-19    Diabetes mellitus    Goiter    History of radiation therapy 06/14/2016 - 07/19/2016   Right Breast 50 Gy 25 fractions   Hypercholesteremia    Hypertension    Ischemic  cardiomyopathy    a. EF 50% by cath and 40-45% by echo 06/2017.   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     albuterol (VENTOLIN HFA) 108 (90 Base) MCG/ACT inhaler SMARTSIG:1-2 Puff(s) By Mouth Every 4 Hours PRN     Alirocumab (PRALUENT) 75 MG/ML SOAJ INJECT 1 PEN INTO THE SKIN EVERY 14 DAYS 6 mL 3   ALPRAZolam (XANAX) 0.25 MG tablet Take 0.25 mg by mouth as needed.     ALPRAZOLAM PO Take 12.5 mg by mouth 2 (two) times daily as needed (anxiety attack).     aspirin EC 81 MG tablet Take 1 tablet (81 mg total) by mouth daily. 90 tablet 3   Coenzyme Q10 100 MG capsule Take 100 mg by mouth daily.     Continuous Blood Gluc Receiver (FREESTYLE LIBRE 2 READER) DEVI Use reader to monitor blood sugars continuously. 1 each 0   Continuous Blood Gluc Sensor (FREESTYLE LIBRE 2 SENSOR) MISC USE 1 SENSOR ONCE EVERY 14  DAYS TO MONITOR BLOOD  SUGARS CONTINUOUSLY 6 each 1   fluconazole (DIFLUCAN) 150 MG tablet Take 150 mg by mouth as needed.     furosemide (LASIX) 20 MG tablet Take 1 tablet (20 mg total) by mouth daily. For 3 days then take as need or fluid and swelling 90 tablet 1   glucose blood (FREESTYLE TEST STRIPS) test strip Use as instructed 100 each 12   HUMALOG 100 UNIT/ML cartridge INECT 3-25 UNITS INTO THE SKIN THREE TIMES DAILY WITH MEALS. USE PER SLIDING SCALE 30 mL 3   hydrALAZINE (APRESOLINE) 10 MG tablet Take 10 mg by mouth as needed. (Patient not taking: Reported on 01/20/2021)     hydrOXYzine (ATARAX/VISTARIL) 25 MG tablet Take 1 tablet (25 mg total) by mouth 3 (three) times daily as needed for anxiety. 30 tablet 0   INPEN 100-BLUE-LILLY DEVI CONSULT MD FOR APP SETTINGS. 1 each 0   LANTUS SOLOSTAR 100 UNIT/ML Solostar Pen INJECT SUBCUTANEOUSLY 70  UNITS DAILY 75 mL 3   methimazole (TAPAZOLE) 5 MG tablet Take by mouth daily. (Patient not taking: Reported on 01/20/2021)     metoprolol succinate (TOPROL-XL) 50 MG 24 hr tablet Take 1 tablet (50 mg total) by mouth daily. Take with or immediately following a meal. 90 tablet 3   nitroGLYCERIN (NITROSTAT) 0.4 MG SL tablet DISSOLVE  ONE TABLET UNDER TONGUE AS NEEDED FOR CHEST PAIN 25 tablet 4   Omega-3 Fatty Acids (FISH OIL OMEGA-3 PO) Take 1 tablet by mouth daily.     pantoprazole (PROTONIX) 40 MG tablet Take 40 mg by mouth daily.     sacubitril-valsartan (ENTRESTO) 97-103 MG Take 1 tablet by mouth 2 (two) times daily. 180 tablet 2   tirzepatide [facility]) 5  MG/0.5ML Pen Inject 5 mg into the skin once a week. If tolerated call for prescription for 7.5 mg dose after third injection 2 mL 0   valACYclovir (VALTREX) 1000 MG tablet Take 1,000 mg by mouth 2 (two) times daily as needed (infection).     venlafaxine (EFFEXOR) 75 MG tablet Take 1 tablet (75 mg total) by mouth 2 (two) times daily. 60 tablet 0   No current facility-administered medications on file prior to visit.    Allergies  Allergen Reactions   Hydrocodone Other (See Comments)    Severe anxiety Confusion    Invokana [Canagliflozin] Other (See Comments)    Constant yeast infections   Amlodipine Other (See Comments)    Muscles hurt   Carvedilol Other (See Comments)    Wheezing with 25mg  BID dosing NOT ALLERGIC 10/08/20   Chlorthalidone Other (See Comments)    Leg pain   Evolocumab Other (See Comments)    Injection site pain   Hydralazine Other (See Comments)    Fatigue   Imdur [Isosorbide Nitrate] Other (See Comments)    Patient reported wheezing   Jardiance [Empagliflozin] Other (See Comments)    lightheadedness, weakness, low BP and yeast infection.  NOT ALLERGIC 10/08/20   Rosuvastatin Other (See Comments)    myalgias   Simvastatin Other (See Comments)    myalgias   Spironolactone Other (See Comments)    Wheezing vs fatigue?   Atorvastatin Other (See Comments)    DIZZINESS   Compazine [Prochlorperazine] Anxiety    There were no vitals taken for this visit.   Assessment/Plan:  1. Hypertension -    Thank you  Ramond Dial, Pharm.D, BCPS, CPP [place] [facility]  [address] [place], [place] [address]  Phone:  [phone]; Fax: [phone]

## 2021-01-27 ENCOUNTER — Ambulatory Visit: Payer: 59

## 2021-01-27 NOTE — Telephone Encounter (Signed)
NA

## 2021-01-28 NOTE — Telephone Encounter (Signed)
Received fax from [facility] for new prescription to be sent to them for mounjaro. Called patient to confirm. She states she has medication already. She got it from [facility].

## 2021-02-08 ENCOUNTER — Telehealth: Payer: Self-pay

## 2021-02-08 ENCOUNTER — Other Ambulatory Visit (HOSPITAL_COMMUNITY): Payer: Self-pay

## 2021-02-08 NOTE — Telephone Encounter (Signed)
Received fax from [facility] requesting new RX for Humalog. Per las office note I dont see that patient is currently on Humalog. Please advise, if so what strength and instructions?

## 2021-02-10 ENCOUNTER — Other Ambulatory Visit: Payer: Self-pay

## 2021-02-10 DIAGNOSIS — E1165 Type 2 diabetes mellitus with hyperglycemia: Secondary | ICD-10-CM

## 2021-02-10 DIAGNOSIS — Z794 Long term (current) use of insulin: Secondary | ICD-10-CM

## 2021-02-10 MED ORDER — HUMALOG 100 UNIT/ML ~~LOC~~ SOCT: SUBCUTANEOUS | 3 refills | Status: DC

## 2021-02-10 NOTE — Telephone Encounter (Signed)
Rx sent to [facility]

## 2021-02-17 ENCOUNTER — Ambulatory Visit (HOSPITAL_BASED_OUTPATIENT_CLINIC_OR_DEPARTMENT_OTHER): Payer: 59 | Admitting: Family

## 2021-02-17 ENCOUNTER — Telehealth: Payer: Self-pay | Admitting: Pharmacy Technician

## 2021-02-17 ENCOUNTER — Encounter (HOSPITAL_BASED_OUTPATIENT_CLINIC_OR_DEPARTMENT_OTHER): Payer: Self-pay

## 2021-02-17 NOTE — Telephone Encounter (Signed)
Patient Advocate Encounter   Left HIPAA compliant VM to verify pt got her omnipod. Was forwarded communication from back in September about needing more information, but I believe that was already handled and hope the pt has already received her product.

## 2021-02-21 ENCOUNTER — Other Ambulatory Visit: Payer: Self-pay | Admitting: Endocrinology

## 2021-03-15 ENCOUNTER — Other Ambulatory Visit: Payer: Self-pay

## 2021-03-15 ENCOUNTER — Other Ambulatory Visit (INDEPENDENT_AMBULATORY_CARE_PROVIDER_SITE_OTHER): Payer: 59

## 2021-03-15 DIAGNOSIS — E1165 Type 2 diabetes mellitus with hyperglycemia: Secondary | ICD-10-CM | POA: Diagnosis not present

## 2021-03-15 DIAGNOSIS — Z794 Long term (current) use of insulin: Secondary | ICD-10-CM | POA: Diagnosis not present

## 2021-03-15 LAB — BASIC METABOLIC PANEL
BUN: 18 mg/dL (ref 6–23)
CO2: 31 mEq/L (ref 19–32)
Calcium: 9.4 mg/dL (ref 8.4–10.5)
Chloride: 105 mEq/L (ref 96–112)
Creatinine, Ser: 1.01 mg/dL (ref 0.40–1.20)
GFR: 59.4 mL/min — ABNORMAL LOW (ref >60.00)
Glucose, Bld: 99 mg/dL (ref 70–99)
Potassium: 4.4 mEq/L (ref 3.5–5.1)
Sodium: 143 mEq/L (ref 135–145)

## 2021-03-15 LAB — HEMOGLOBIN A1C: Hgb A1c MFr Bld: 8.6 % — ABNORMAL HIGH (ref 4.6–6.5)

## 2021-03-16 ENCOUNTER — Other Ambulatory Visit: Payer: 59

## 2021-03-21 ENCOUNTER — Other Ambulatory Visit: Payer: Self-pay

## 2021-03-21 NOTE — Progress Notes (Signed)
Patient ID: Destiny Beasley, female   DOB: [birth year], 65 y.o.   MRN: [id number]   Reason for Appointment: Type II Diabetes follow-up   History of Present Illness    Date of diagnosis: 10/2008  Previous history: She had markedly increased blood sugars at diagnosis and was tried on oral hypoglycemic drugs and Victoza for about 9 months before starting an insulin.  Since 2011 she had been on basal bolus insulin regimen Previously had had difficulty controlling her diabetes mostly because of difficulty with compliance with various aspects of self-care.  Previous regimen: Toujeo 60 units at 8 am . Apidra 20 units at times  She was started on the Omnipod insulin pump on 09/11/16  Recent history:   Insulin regimen: HUMALOG U-100 with Inpen: Carbohydrate coverage 1: 15, sensitivity 1: 15 Lantus insulin variable doses twice daily  Non-insulin hypoglycemic drugs: None currently, was on Mounjaro Previously Ozempic 0.5 mg weekly  Her A1c has been consistently high, last 9.3   Current management, blood sugar patterns and problems: She was switched from Ozempic 0.5 mg weekly to Mounjaro 5 mg weekly but she thinks this caused her to have more nausea  She stopped this 2 weeks ago  However does appear to have lost some weight Review of her mealtime doses from October indicate that she was likely missing about half of her breakfast or lunch doses and a few suppertime doses  Not clear why she keeps changing her carb ratio even though it was recommended at 1: 6 on the last visit She also keeps adjusting her Lantus arbitrarily and goes between 40 and 60 units, previously was recommended 55 units twice daily In the last week she has had a poor diet especially eating out and blood sugars have been out of control postprandially Her Elenor Legato appears to be fairly close to the lab glucose AVERAGE blood sugar and time in range is somewhat better compared to her last visit Also she thinks that sometimes  she will be late in giving her Humalog injections  She is still waiting for authorization of her pump  She was doing some treadmill walking she was told not to do this because of osteoporosis   CGM data and interpretation for the last 2 weeks from freestyle libre  Blood sugars were generally better in the first week compared to the second week, daily average range between 139 and 193 in the first week and in the last week ranging between 1 53 and 220 HYPERGLYCEMIA episodes occur sometimes overnight but most frequently during the day after various meals HIGHEST blood sugars overall are around dinnertime POSTPRANDIAL readings are sporadically higher at different times but sometimes tend to stay high for several hours Overnight readings are quite variable but mostly high and only occasionally will have a low sugar or low normal readings Hypoglycemia only documented once overnight and not in the daytime  CGM use % of time 94  2-week average/GV 185/31  Time in range       47% was 36  % Time Above 180 42  % Time above 250 11  % Time Below 70 0     PRE-MEAL Fasting Lunch Dinner Bedtime Overall  Glucose range:       Averages: 142 192 215 192 185   POST-MEAL PC Breakfast PC Lunch PC Dinner  Glucose range:     Averages: 192 201 224    PREVIOUSLY:  CGM use % of time   2-week average/GV   Time  in range     36   % was 33  % Time Above 180/250 36/28  % Time above 250   % Time Below 70 0     PRE-MEAL Fasting Lunch Dinner Bedtime Overall  Glucose range:       Averages: 174 193  232 208   POST-MEAL PC Breakfast PC Lunch PC Dinner  Glucose range:     Averages: 204 232 245    Glycemic patterns summary: Overall blood sugars are highly variable and persistently high, on an average blood sugars are above 180 target throughout the day and night   Dietician visit: Most recent: 04/2010 Weight control:  Wt Readings from Last 3 Encounters:  03/22/21 266 lb (120.7 kg)  01/20/21 270 lb  6.4 oz (122.7 kg)  01/07/21 265 lb (120.2 kg)         Diabetes labs:  Lab Results  Component Value Date   HGBA1C 8.6 (H) 03/15/2021   HGBA1C 9.3 (H) 11/30/2020   HGBA1C 9.0 (H) 07/26/2020   Lab Results  Component Value Date   MICROALBUR <0.7 08/25/2020   [place] 52 10/06/2020   CREATININE 1.01 03/15/2021   Other active medical problems including recent evaluation of thyroid: See review of systems   No visits with results within 1 Day(s) from this visit.  Latest known visit with results is:  Lab on 03/15/2021  Component Date Value Ref Range Status   Sodium 03/15/2021 143  135 - 145 mEq/L Final   Potassium 03/15/2021 4.4  3.5 - 5.1 mEq/L Final   Chloride 03/15/2021 105  96 - 112 mEq/L Final   CO2 03/15/2021 31  19 - 32 mEq/L Final   Glucose, Bld 03/15/2021 99  70 - 99 mg/dL Final   BUN 03/15/2021 18  6 - 23 mg/dL Final   Creatinine, Ser 03/15/2021 1.01  0.40 - 1.20 mg/dL Final   GFR 03/15/2021 59.40 (L)  >60.00 mL/min Final   Calculated using the CKD-EPI Creatinine Equation (2021)   Calcium 03/15/2021 9.4  8.4 - 10.5 mg/dL Final   Hgb A1c MFr Bld 03/15/2021 8.6 (H)  4.6 - 6.5 % Final   Glycemic Control Guidelines for People with Diabetes:Non Diabetic:  <6%Goal of Therapy: <7%Additional Action Suggested:  >8%      Allergies as of 03/22/2021       Reactions   Hydrocodone Other (See Comments)   Severe anxiety Confusion   Invokana [canagliflozin] Other (See Comments)   Constant yeast infections   Amlodipine Other (See Comments)   Muscles hurt   Carvedilol Other (See Comments)   Wheezing with 25mg  BID dosing NOT ALLERGIC 10/08/20   Chlorthalidone Other (See Comments)   Leg pain   Evolocumab Other (See Comments)   Injection site pain   Hydralazine Other (See Comments)   Fatigue   Imdur [isosorbide Nitrate] Other (See Comments)   Patient reported wheezing   Jardiance [empagliflozin] Other (See Comments)   lightheadedness, weakness, low BP and yeast infection.  NOT  ALLERGIC 10/08/20   Rosuvastatin Other (See Comments)   myalgias   Simvastatin Other (See Comments)   myalgias   Spironolactone Other (See Comments)   Wheezing vs fatigue?   Atorvastatin Other (See Comments)   DIZZINESS   Compazine [prochlorperazine] Anxiety        Medication List        Accurate as of March 22, 2021  9:15 AM. If you have any questions, ask your nurse or doctor.  Advair HFA 115-21 MCG/ACT inhaler Generic drug: fluticasone-salmeterol as needed.   albuterol 108 (90 Base) MCG/ACT inhaler Commonly known as: VENTOLIN HFA SMARTSIG:1-2 Puff(s) By Mouth Every 4 Hours PRN   ALPRAZOLAM PO Take 12.5 mg by mouth 2 (two) times daily as needed (anxiety attack).   ALPRAZolam 0.25 MG tablet Commonly known as: XANAX Take 0.25 mg by mouth as needed.   aspirin EC 81 MG tablet Take 1 tablet (81 mg total) by mouth daily.   Coenzyme Q10 100 MG capsule Take 100 mg by mouth daily.   FISH OIL OMEGA-3 PO Take 1 tablet by mouth daily.   fluconazole 150 MG tablet Commonly known as: DIFLUCAN Take 150 mg by mouth as needed.   FreeStyle Libre 2 Reader [place] Use reader to monitor blood sugars continuously.   FreeStyle Libre 2 Sensor Misc USE 1 SENSOR ONCE EVERY 14  DAYS TO MONITOR BLOOD  SUGARS CONTINUOUSLY   FREESTYLE TEST STRIPS test strip Generic drug: glucose blood Use as instructed   furosemide 20 MG tablet Commonly known as: LASIX Take 1 tablet (20 mg total) by mouth daily. For 3 days then take as need or fluid and swelling   HumaLOG 100 UNIT/ML cartridge Generic drug: insulin lispro INECT 3-25 UNITS INTO THE SKIN THREE TIMES DAILY WITH MEALS. USE PER SLIDING SCALE   hydrALAZINE 10 MG tablet Commonly known as: APRESOLINE Take 10 mg by mouth as needed.   hydrOXYzine 25 MG tablet Commonly known as: ATARAX Take 1 tablet (25 mg total) by mouth 3 (three) times daily as needed for anxiety.   InPen 100-Blue-Lilly Devi Generic drug: injection  device for insulin CONSULT MD FOR APP SETTINGS.   Lantus SoloStar 100 UNIT/ML Solostar Pen Generic drug: insulin glargine INJECT SUBCUTANEOUSLY 70  UNITS DAILY   methimazole 5 MG tablet Commonly known as: TAPAZOLE Take by mouth daily.   metoprolol succinate 50 MG 24 hr tablet Commonly known as: TOPROL-XL Take 1 tablet (50 mg total) by mouth daily. Take with or immediately following a meal.   Mounjaro 5 MG/0.5ML Pen Generic drug: tirzepatide INJECT 5MG  INTO THE SKIN ONCE A WEEK, IF TOLERATED INCREASE TO 7.5MG  AFTER 3RD INJECTION   nitroGLYCERIN 0.4 MG SL tablet Commonly known as: NITROSTAT DISSOLVE ONE TABLET UNDER TONGUE AS NEEDED FOR CHEST PAIN   pantoprazole 40 MG tablet Commonly known as: PROTONIX Take 40 mg by mouth daily.   Praluent 75 MG/ML Soaj Generic drug: Alirocumab INJECT 1 PEN INTO THE SKIN EVERY 14 DAYS   sacubitril-valsartan 97-103 MG Commonly known as: ENTRESTO Take 1 tablet by mouth 2 (two) times daily.   valACYclovir 1000 MG tablet Commonly known as: VALTREX Take 1,000 mg by mouth 2 (two) times daily as needed (infection).   venlafaxine 75 MG tablet Commonly known as: EFFEXOR Take 1 tablet (75 mg total) by mouth 2 (two) times daily.        Allergies:  Allergies  Allergen Reactions   Hydrocodone Other (See Comments)    Severe anxiety Confusion    Invokana [Canagliflozin] Other (See Comments)    Constant yeast infections   Amlodipine Other (See Comments)    Muscles hurt   Carvedilol Other (See Comments)    Wheezing with 25mg  BID dosing NOT ALLERGIC 10/08/20   Chlorthalidone Other (See Comments)    Leg pain   Evolocumab Other (See Comments)    Injection site pain   Hydralazine Other (See Comments)    Fatigue   Imdur [Isosorbide Nitrate] Other (See Comments)  Patient reported wheezing   Jardiance [Empagliflozin] Other (See Comments)    lightheadedness, weakness, low BP and yeast infection.  NOT ALLERGIC 10/08/20   Rosuvastatin Other  (See Comments)    myalgias   Simvastatin Other (See Comments)    myalgias   Spironolactone Other (See Comments)    Wheezing vs fatigue?   Atorvastatin Other (See Comments)    DIZZINESS   Compazine [Prochlorperazine] Anxiety    Past Medical History:  Diagnosis Date   Anemia    Anxiety    CAD in native artery    a. Inf STEMI 07/6960 complicated by cardiogenic shock/complete heart block s/p emergent cath showing culprit large dominant LCx s/p aspiration thrombectomy and DES, EF 50% by cath, 40-45% by echo.   Chronic systolic heart failure ([place]) 06/25/2017   Echo 11/19: mild LVH, EF 40-45, inf-lat HK, Gr 1 DD, trivial MR   Complete heart block, transient ([place])    a. 06/2017 in setting of acute inf MI -> resolved after PCI.   COVID-19    Diabetes mellitus    Goiter    History of radiation therapy 06/14/2016 - 07/19/2016   Right Breast 50 Gy 25 fractions   Hypercholesteremia    Hypertension    Ischemic cardiomyopathy    a. EF 50% by cath and 40-45% by echo 06/2017.   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   Meningioma [facility])    Nuclear stress test    Nuclear stress test 10/19: EF 35, inferolateral, apical scar; inferior, apical inferior infarct with mild peri-infarct ischemia; high risk   Obesity    Personal history of chemotherapy    Personal history of radiation therapy    Pituitary tumor    Seasonal asthma    Sleep apnea    does not use every night   Stroke [facility])    TIA - Dec 1 st, 2017    Past Surgical History:  Procedure Laterality Date   ABDOMINAL HYSTERECTOMY     BRAIN MENINGIOMA EXCISION  2005   BREAST BIOPSY     BREAST LUMPECTOMY Right    2017   BREAST LUMPECTOMY WITH RADIOACTIVE SEED LOCALIZATION Right 04/20/2016   Procedure: RIGHT BREAST LUMPECTOMY WITH RADIOACTIVE SEED LOCALIZATION;  Surgeon: Fanny Skates, MD;  Location: [place];  Service: General;   Laterality: Right;   BREAST REDUCTION SURGERY Bilateral 10/23/2016   Procedure: BILATERAL BREAST REDUCTION WITH LIPOSUCTION ASSISTANCE;  Surgeon: Cristine Polio, MD;  Location: [place];  Service: Plastics;  Laterality: Bilateral;   CESAREAN SECTION     CORONARY STENT INTERVENTION N/A 06/25/2017   Procedure: CORONARY STENT INTERVENTION;  Surgeon: Sherren Mocha, MD;  Location: [place] CV LAB;  Service: Cardiovascular;  Laterality: N/A;   CORONARY/GRAFT ACUTE MI REVASCULARIZATION N/A 06/25/2017   Procedure: Coronary/Graft Acute MI Revascularization;  Surgeon: Sherren Mocha, MD;  Location: [place] CV LAB;  Service: Cardiovascular;  Laterality: N/A;   FOOT SURGERY Bilateral 2016   hammer toe surgery   LEFT HEART CATH AND CORONARY ANGIOGRAPHY N/A 06/25/2017   Procedure: LEFT HEART CATH AND CORONARY ANGIOGRAPHY;  Surgeon: Sherren Mocha, MD;  Location: [place] [facility] LAB;  Service: Cardiovascular;  Laterality: N/A;   LEFT HEART CATH AND CORONARY ANGIOGRAPHY N/A 05/13/2020   Procedure: LEFT HEART CATH AND CORONARY ANGIOGRAPHY;  Surgeon: Lorretta Harp, MD;  Location: [place] CV LAB;  Service: Cardiovascular;  Laterality: N/A;   MENISCUS REPAIR Left 2015   PITUITARY SURGERY     REDUCTION MAMMAPLASTY     RIGHT HEART CATH N/A 06/25/2017   Procedure: RIGHT HEART CATH;  Surgeon: Sherren Mocha, MD;  Location: [place] CV LAB;  Service: Cardiovascular;  Laterality: N/A;   THYROID SURGERY      Family History  Problem Relation Age of Onset   Diabetes Mother    Hyperlipidemia Mother    Hypertension Mother    Alcohol abuse Brother     Social History:  reports that she has never smoked. She has never used smokeless tobacco. She reports current alcohol use of about 1.0 standard drink per week. She reports that she does not use drugs.  Review of Systems:  THYROID: Because of her anxiety and panic attacks her PCP was evaluating her thyroid levels in early 2022 However  only her free T3 level was higher than normal once but no other abnormalities including persistently normal TSH levels Labs have been normal here   Lab Results  Component Value Date   TSH 1.26 01/18/2021   TSH 3.57 11/30/2020   TSH 1.295 10/08/2020   FREET4 0.80 01/18/2021   FREET4 0.62 11/30/2020   Free T3 level normal at 3.5  Hypertension: Well-controlled, blood pressure is managed by cardiologist with readings as follows:  She is taking Entresto and metoprolol 25 mg as also hydralazine, previously on Avapro and carvedilol  BP Readings from Last 3 Encounters:  03/22/21 (!) 146/98  01/20/21 (!) 162/104  01/07/21 (!) 144/80     Lipids: She has been on Praluent from cardiologist, could not tolerate Repatha  Followed by cardiology  She does have a history of MI  Lab Results  Component Value Date   CHOL 120 10/06/2020   HDL 54 10/06/2020   LDLCALC 52 10/06/2020   LDLDIRECT 67 07/15/2020   TRIG 63 10/06/2020   CHOLHDL 2.2 10/06/2020   Lab Results  Component Value Date   ALT 24 11/08/2020    Diabetic shoe prescription has been given previously  She reportedly has had surgery for pituitary tumor but last MRI only shows a 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     ASSESSMENT/ PLAN:    Diabetes type 2 with obesity, hypertension and hyperlipidemia, BMI more than 35  See history of present illness for detailed discussion of current management, interpretation of her continuous glucose monitoring record, blood sugar patterns and problems identified  Her A1c has been consistently high but relatively better at 8.6 A1c target 7%  She is on basal bolus insulin, GLP-1 drugs as above Control is still suboptimal especially mealtime control Appears  to be needing higher doses of basal insulin in the morning compared to the evening She is very consistent with using her CGM for monitoring but may benefit from using the Lerna 3 with better accuracy and continuous monitoring and display  Also she has lowered her carbohydrate coverage to 1: 15 instead of 6 as recommended last time and not clear why  She previously had done better with the insulin pump compared to injection with much better compliance with mealtime doses, better supervision of her dose adjustment, reduced insulin requirement  and more precise basal insulin adjustment    Recommendations today:    She will follow-up continue recent change in her Lantus to 60 units in the morning and take only 30 units in the evening She will try to be consistent with her diet with avoiding high fat meals and high caffeine or energy drinks May consider switching to [place] unless she is on the pump She will not keep changing her Lantus arbitrarily and only with consultation here Correction factor I: 12 instead of 15 at all meals Reminded her that she needs to takes her mealtime insulin at the start of each meal consistently Avoid changing the carbohydrate coverage arbitrarily Go back to OZEMPIC 0.5 mg but will need to do 0.25 mg for the first couple of shots, may also consider increasing the dose further on the next visit    There are no Patient Instructions on file for this visit. Elayne Snare 03/22/2021, 9:15 AM   This visit occurred during the SARS-CoV-2 public health emergency.  Safety protocols were in place, including screening questions prior to the visit, additional usage of staff PPE, and extensive cleaning of exam room while observing appropriate contact time as indicated for disinfecting solutions.

## 2021-03-22 ENCOUNTER — Encounter: Payer: Self-pay | Admitting: Endocrinology

## 2021-03-22 ENCOUNTER — Other Ambulatory Visit: Payer: Self-pay

## 2021-03-22 ENCOUNTER — Ambulatory Visit (INDEPENDENT_AMBULATORY_CARE_PROVIDER_SITE_OTHER): Payer: 59 | Admitting: Endocrinology

## 2021-03-22 VITALS — BP 146/98 | HR 90 | Ht 72.0 in | Wt 266.0 lb

## 2021-03-22 DIAGNOSIS — Z794 Long term (current) use of insulin: Secondary | ICD-10-CM

## 2021-03-22 DIAGNOSIS — E1165 Type 2 diabetes mellitus with hyperglycemia: Secondary | ICD-10-CM | POA: Diagnosis not present

## 2021-03-22 MED ORDER — FREESTYLE LIBRE 3 SENSOR MISC: 1.0000 | 2 refills | Status: DC

## 2021-03-22 NOTE — Patient Instructions (Addendum)
Lantus 60 U in am and 30 at night  Ozempic 0.25mg  x 2 shots and 0.5 weekly  Take humalog before meals  Carb ratio 1:12

## 2021-03-30 ENCOUNTER — Encounter: Payer: Self-pay | Admitting: Endocrinology

## 2021-04-02 ENCOUNTER — Other Ambulatory Visit: Payer: Self-pay | Admitting: Endocrinology

## 2021-04-04 ENCOUNTER — Other Ambulatory Visit: Payer: Self-pay | Admitting: Physician Assistant

## 2021-04-08 ENCOUNTER — Other Ambulatory Visit: Payer: Self-pay

## 2021-04-08 DIAGNOSIS — E1165 Type 2 diabetes mellitus with hyperglycemia: Secondary | ICD-10-CM

## 2021-04-08 MED ORDER — MOUNJARO 5 MG/0.5ML ~~LOC~~ SOAJ: SUBCUTANEOUS | 3 refills | Status: DC

## 2021-04-16 ENCOUNTER — Other Ambulatory Visit: Payer: Self-pay | Admitting: Endocrinology

## 2021-04-21 ENCOUNTER — Encounter: Payer: Self-pay | Admitting: Endocrinology

## 2021-04-25 ENCOUNTER — Telehealth: Payer: Self-pay

## 2021-04-25 ENCOUNTER — Other Ambulatory Visit (HOSPITAL_COMMUNITY): Payer: Self-pay

## 2021-04-25 NOTE — Telephone Encounter (Signed)
Patient Advocate Encounter   Received notification from [facility] that prior authorization for Destiny Beasley is required by his/her insurance [employer].   PA submitted on 04/25/21  Key#: [id number]  Status is pending    [facility] will continue to follow:  Patient Advocate Fax:  [phone]

## 2021-04-26 ENCOUNTER — Other Ambulatory Visit (HOSPITAL_COMMUNITY): Payer: Self-pay

## 2021-04-26 NOTE — Telephone Encounter (Signed)
Patient Advocate Encounter  PA cancelled. Letter indicates it Is on the plans list of covered drugs.   Unable to test claim or find in [employer] (our supplier) Looks like the pt will have to get it directly from the manufacturer, [employer].

## 2021-05-09 ENCOUNTER — Ambulatory Visit (HOSPITAL_BASED_OUTPATIENT_CLINIC_OR_DEPARTMENT_OTHER): Payer: 59 | Admitting: Family

## 2021-05-10 ENCOUNTER — Other Ambulatory Visit: Payer: Self-pay | Admitting: Endocrinology

## 2021-05-10 DIAGNOSIS — E1165 Type 2 diabetes mellitus with hyperglycemia: Secondary | ICD-10-CM

## 2021-05-12 ENCOUNTER — Encounter (HOSPITAL_BASED_OUTPATIENT_CLINIC_OR_DEPARTMENT_OTHER): Payer: Self-pay

## 2021-05-12 ENCOUNTER — Telehealth (HOSPITAL_BASED_OUTPATIENT_CLINIC_OR_DEPARTMENT_OTHER): Payer: Self-pay

## 2021-05-12 NOTE — Telephone Encounter (Addendum)
Called patient to follow up on am message.   Current hallucinations recommended that patient urgently be seen at her primary care office to determine the cause of these!   No color/ temperature change with swelling in the arm. Patient does endorse being weight up. Patient has not been taking Lasix daily. She will take for 3 days and then RN will follow up on Monday to see how fluid is doing. May need a daily fluid pill? Will route to Laurann Montana, NP for advisement!   Able to reschedule patient to 1/24 with Laurann Montana, NP     "Hi Destiny Beasley,    Hallucinations are not a side effect of Entresto. If you are noting those I recommend prompt follow up with your primary care provider.    In regards to the swelling in your arm - is there any change in color or temperature of the arm? Have you injured it recently that you are aware of? Be sure to elevate the arm.    Have you been taking your Lasix at all? If not, given weight gain and swelling I recommend taking for the next 3 days. If you have been taking, please take 2 tablets daily in the morning for the next three days. Be sure to keep up the good work with low salt diet and restricting to less than 2 liters of fluid per day.    Daphene Jaeger will contact you about scheduling a sooner appointment.    Best,  Destiny Dubonnet, NP "

## 2021-05-12 NOTE — Telephone Encounter (Signed)
Agree with plan as provided. Will route to PCP as FYI.  Loel Dubonnet, NP

## 2021-05-17 ENCOUNTER — Other Ambulatory Visit: Payer: Self-pay

## 2021-05-17 ENCOUNTER — Ambulatory Visit (HOSPITAL_BASED_OUTPATIENT_CLINIC_OR_DEPARTMENT_OTHER): Payer: 59 | Admitting: Family

## 2021-05-17 ENCOUNTER — Other Ambulatory Visit: Payer: Self-pay | Admitting: Endocrinology

## 2021-05-17 ENCOUNTER — Encounter (HOSPITAL_BASED_OUTPATIENT_CLINIC_OR_DEPARTMENT_OTHER): Payer: Self-pay | Admitting: Family

## 2021-05-17 VITALS — BP 136/82 | HR 91 | Ht 72.0 in | Wt 268.1 lb

## 2021-05-17 DIAGNOSIS — E1165 Type 2 diabetes mellitus with hyperglycemia: Secondary | ICD-10-CM | POA: Diagnosis not present

## 2021-05-17 DIAGNOSIS — I25118 Atherosclerotic heart disease of native coronary artery with other forms of angina pectoris: Secondary | ICD-10-CM | POA: Diagnosis not present

## 2021-05-17 DIAGNOSIS — Z794 Long term (current) use of insulin: Secondary | ICD-10-CM

## 2021-05-17 DIAGNOSIS — E785 Hyperlipidemia, unspecified: Secondary | ICD-10-CM | POA: Diagnosis not present

## 2021-05-17 DIAGNOSIS — I10 Essential (primary) hypertension: Secondary | ICD-10-CM

## 2021-05-17 DIAGNOSIS — G4733 Obstructive sleep apnea (adult) (pediatric): Secondary | ICD-10-CM

## 2021-05-17 DIAGNOSIS — I5020 Unspecified systolic (congestive) heart failure: Secondary | ICD-10-CM

## 2021-05-17 DIAGNOSIS — I5022 Chronic systolic (congestive) heart failure: Secondary | ICD-10-CM

## 2021-05-17 NOTE — Progress Notes (Signed)
Office Visit    Patient Name: Destiny Beasley Date of Encounter: 05/17/2021  PCP:  Lawerance Cruel, [facility]  Cardiologist:  Sherren Mocha, MD  Advanced Practice Provider:  Liliane Shi, PA-C Electrophysiologist:  None    Chief Complaint    Destiny Beasley is a 66 y.o. female with a hx of CAD s/p inferior-lateral STEMI 06/2017 with DES to LCx, HFrEF, ICM, DM2, TIA, OSA, breast cancer, HTN, HLD with statin intolerance, COVID19, pituitary tumor s/p resection, meningioma, anxiety presents today for HFrEF follow up.  Past Medical History    Past Medical History:  Diagnosis Date   Anemia    Anxiety    CAD in native artery    a. Inf STEMI 0/3833 complicated by cardiogenic shock/complete heart block s/p emergent cath showing culprit large dominant LCx s/p aspiration thrombectomy and DES, EF 50% by cath, 40-45% by echo.   Chronic systolic heart failure ([place]) 06/25/2017   Echo 11/19: mild LVH, EF 40-45, inf-lat HK, Gr 1 DD, trivial MR   Complete heart block, transient ([place])    a. 06/2017 in setting of acute inf MI -> resolved after PCI.   COVID-19    Diabetes mellitus    Goiter    History of radiation therapy 06/14/2016 - 07/19/2016   Right Breast 50 Gy 25 fractions   Hypercholesteremia    Hypertension    Ischemic cardiomyopathy    a. EF 50% by cath and 40-45% by echo 06/2017.   Leg pain    ABIs 07/2019: Normal (R 1.27; L 1.25)   Malignant neoplasm of upper-outer quadrant of right female breast ([place]) 02/24/2016   Meningioma [facility])    Nuclear stress test    Nuclear stress test 10/19: EF 35, inferolateral, apical scar; inferior, apical inferior infarct with mild peri-infarct ischemia; high risk   Obesity    Personal history of chemotherapy    Personal history of radiation therapy    Pituitary tumor    Seasonal asthma    Sleep apnea    does not use every night   Stroke [facility])    TIA - Dec 1 st, 2017   Past Surgical History:  Procedure  Laterality Date   ABDOMINAL HYSTERECTOMY     BRAIN MENINGIOMA EXCISION  2005   BREAST BIOPSY     BREAST LUMPECTOMY Right    2017   BREAST LUMPECTOMY WITH RADIOACTIVE SEED LOCALIZATION Right 04/20/2016   Procedure: RIGHT BREAST LUMPECTOMY WITH RADIOACTIVE SEED LOCALIZATION;  Surgeon: Fanny Skates, MD;  Location: [place] [facility];  Service: General;  Laterality: Right;   BREAST REDUCTION SURGERY Bilateral 10/23/2016   Procedure: BILATERAL BREAST REDUCTION WITH LIPOSUCTION ASSISTANCE;  Surgeon: Cristine Polio, MD;  Location: [place];  Service: Plastics;  Laterality: Bilateral;   CESAREAN SECTION     CORONARY STENT INTERVENTION N/A 06/25/2017   Procedure: CORONARY STENT INTERVENTION;  Surgeon: Sherren Mocha, MD;  Location: [place] [facility];  Service: Cardiovascular;  Laterality: N/A;   CORONARY/GRAFT ACUTE MI REVASCULARIZATION N/A 06/25/2017   Procedure: Coronary/Graft Acute MI Revascularization;  Surgeon: Sherren Mocha, MD;  Location: [place] [facility];  Service: Cardiovascular;  Laterality: N/A;   FOOT SURGERY Bilateral 2016   hammer toe surgery   LEFT HEART CATH AND CORONARY ANGIOGRAPHY N/A 06/25/2017   Procedure: LEFT HEART CATH AND CORONARY ANGIOGRAPHY;  Surgeon: Sherren Mocha, MD;  Location: [place] [facility];  Service: Cardiovascular;  Laterality: N/A;   LEFT  HEART CATH AND CORONARY ANGIOGRAPHY N/A 05/13/2020   Procedure: LEFT HEART CATH AND CORONARY ANGIOGRAPHY;  Surgeon: Lorretta Harp, MD;  Location: [place] [facility];  Service: Cardiovascular;  Laterality: N/A;   MENISCUS REPAIR Left 2015   PITUITARY SURGERY     REDUCTION MAMMAPLASTY     RIGHT HEART CATH N/A 06/25/2017   Procedure: RIGHT HEART CATH;  Surgeon: Sherren Mocha, MD;  Location: [place] [facility];  Service: Cardiovascular;  Laterality: N/A;   THYROID SURGERY      Allergies  Allergies  Allergen Reactions   Hydrocodone Other (See Comments)    Severe anxiety Confusion     Invokana [Canagliflozin] Other (See Comments)    Constant yeast infections   Amlodipine Other (See Comments)    Muscles hurt   Carvedilol Other (See Comments)    Wheezing with 25mg  BID dosing NOT ALLERGIC 10/08/20   Chlorthalidone Other (See Comments)    Leg pain   Evolocumab Other (See Comments)    Injection site pain   Hydralazine Other (See Comments)    Fatigue   Imdur [Isosorbide Nitrate] Other (See Comments)    Patient reported wheezing   Jardiance [Empagliflozin] Other (See Comments)    lightheadedness, weakness, low BP and yeast infection.  NOT ALLERGIC 10/08/20   Rosuvastatin Other (See Comments)    myalgias   Simvastatin Other (See Comments)    myalgias   Spironolactone Other (See Comments)    Wheezing vs fatigue?   Atorvastatin Other (See Comments)    DIZZINESS   Compazine [Prochlorperazine] Anxiety    History of Present Illness    Destiny Beasley is a 67 y.o. female with a hx of CAD s/p inferior-lateral STEMI 06/2017 with DES to LCx, HFrEF, ICM, DM2, hyperthyroidism, TIA, OSA, breast cancer, HTN, HLD with statin intolerance, COVID19, pituitary tumor s/p resection, meningioma, anxiety last seen  01/07/21.   CAD dates back to inf-lat STEMI 06/2017 with DES to pLCx. Cardiac catheterization 04/2020 with patent LCx stent and pLAD with 40$ stenosis. Her LVEF since her STEMI has been 40-45%.   She was seen via telemedicine 08/2020 and started on Lasix due to volume excess. She was seen6/17/22 with chief complaint of hot flashes which she attributed to Carvedilol. As she was being discharged from the office she developed chest pain and shortness of breath. EKG was without acute changes. BP as high as 200/100. She was transported via EMS to ED. Symptoms resolved with nitroglycerin and Xanax. Hs-troponin negative x2, d-dimer minimally elevated 0.62 though negative when adjusted for age. Head CT unremarkable.   ED visit 10/19/20 was COVID positive treated with antiviral. Visit 11/01/20,  12/14/20, and 12/28/20 with optimization of antihypertensive regimen.  Seen 01/07/21 and [name] increased to 97-103mg  BID. She has not been seen since that time.   Contacted the office yesterday noting hallucinations and right arm/hand swelling. She was encouraged to contact her primary care provider regarding hallucinations. She presents today for follow up. Tells me she stopped her Jardiance which she was taking intermittently due to recurrent yeast infections and her hallucinations resolved. We discussed that this is not a common side effect of Jardiance and she should follow up with her primary care provider promptly. Tells me she would see people in her peripheral vision who were not there. Reports no shortness of breath and stable mild dyspnea on exertion. Reports no chest pain, pressure, or tightness. No lower extremity edema, orthopnea, PND. Reports no palpitations.  Home BP routinely 130s-140s. Notes she is  fluctuating from the 47-51mg  to 97-103mg  dose of Entresto as sometimes notes cramps with higher dose. Having occasional right arm swelling which is tender on palpation and is most swollen in the morning then improves throughout the day. Does wonder whether she has arthritis. Excited to see her sophomore grandson play basketball this evening.   EKGs/Labs/Other Studies Reviewed:   The following studies were reviewed today: Echocardiogram 05/13/20 EF 40-45, inf and inf-lat HK, normal RVSF, trivial MR, trivial AI   Cardiac catheterization 05/13/20 LAD prox 40 LCx stent patent EF 45-50    ABIs 07/2019 Normal    Echocardiogram 09/05/2018 EF 40-45, inf-lat AK, mild LVH, Gr 1 DD, normal RVSF   Echo 03/07/18 Mild LVH, EF 40-45, GLS -11%, inf-lat HK, Gr 1 DD, trivial MR   Nuclear stress test 02/12/18 EF 35, inferolateral, apical scar; inferior, apical inferior infarct with mild peri-infarct ischemia; high risk   Echo 06/26/17 EF 40-45, inferolateral hypokinesis, mild MR   Cardiac  catheterization 06/25/17 LM luminal irregularities LAD luminal irregularities, proximal 30 LCx proximal 100 RCA irregularities EF 45-50, inferolateral akinesis PCI: 4 x 20 mm Synergy DES to the proximal LCx   Echo 03/25/16 Moderate LVH, EF 60-65, normal wall motion   Carotid US 12/17 Bilateral ICA 1-39   EKG:  No EKG today.  Recent Labs: 09/02/2020: NT-Pro BNP 318 10/08/2020: B Natriuretic Peptide 45.4 11/08/2020: ALT 24; Hemoglobin 15.0; Platelets 325 01/18/2021: TSH 1.26 03/15/2021: BUN 18; Creatinine, Ser 1.01; Potassium 4.4; Sodium 143  Recent Lipid Panel    Component Value Date/Time   CHOL 120 10/06/2020 0743   TRIG 63 10/06/2020 0743   HDL 54 10/06/2020 0743   CHOLHDL 2.2 10/06/2020 0743   CHOLHDL 2 08/12/2018 0944   VLDL 19.2 08/12/2018 0944   LDLCALC 52 10/06/2020 0743   LDLDIRECT 67 07/15/2020 1609   LDLDIRECT 87.0 06/04/2019 0835   Home Medications   Current Meds  Medication Sig   ADVAIR HFA 115-21 MCG/ACT inhaler as needed.   albuterol (VENTOLIN HFA) 108 (90 Base) MCG/ACT inhaler SMARTSIG:1-2 Puff(s) By Mouth Every 4 Hours PRN   Alirocumab (PRALUENT) 75 MG/ML SOAJ INJECT 1 PEN INTO THE SKIN EVERY 14 DAYS   ALPRAZolam (XANAX) 0.25 MG tablet Take 0.25 mg by mouth as needed.   aspirin EC 81 MG tablet Take 1 tablet (81 mg total) by mouth daily.   Coenzyme Q10 100 MG capsule Take 100 mg by mouth daily.   Continuous Blood Gluc Receiver (FREESTYLE LIBRE 2 READER) DEVI Use reader to monitor blood sugars continuously.   Continuous Blood Gluc Sensor (FREESTYLE LIBRE 3 SENSOR) MISC 1 Device by Does not apply route every 14 (fourteen) days. Apply 1 sensor on upper arm every 14 days for continuous glucose monitoring   fluconazole (DIFLUCAN) 150 MG tablet Take 150 mg by mouth as needed.   furosemide (LASIX) 20 MG tablet TAKE 1 TABLET BY MOUTH  DAILY FOR 3 DAYS THEN TAKE  AS NEEDED FOR FLUID AND  SWELLING   glucose blood (FREESTYLE TEST STRIPS) test strip Use as instructed    hydrALAZINE (APRESOLINE) 10 MG tablet Take 10 mg by mouth as needed.   INPEN 100-BLUE-LILLY DEVI CONSULT MD FOR APP SETTINGS.   insulin lispro (HUMALOG) 100 UNIT/ML cartridge INJECT SUBCUTANEOUSLY 3 TO 25  UNITS 3 TIMES DAILY WITH MEALS . USE PER SLIDING SCALE   LANTUS SOLOSTAR 100 UNIT/ML Solostar Pen INJECT SUBCUTANEOUSLY 70  UNITS DAILY   nitroGLYCERIN (NITROSTAT) 0.4 MG SL tablet DISSOLVE ONE TABLET UNDER TONGUE  AS NEEDED FOR CHEST PAIN   Omega-3 Fatty Acids (FISH OIL OMEGA-3 PO) Take 1 tablet by mouth daily.   ondansetron (ZOFRAN-ODT) 4 MG disintegrating tablet ondansetron 4 mg disintegrating tablet  TAKE 1 TABLET BY MOUTH EVERY 4 HOURS AS NEEDED FOR NAUSEA   pantoprazole (PROTONIX) 40 MG tablet Take 40 mg by mouth daily.   valACYclovir (VALTREX) 1000 MG tablet Take 1,000 mg by mouth 2 (two) times daily as needed (infection).   venlafaxine (EFFEXOR) 75 MG tablet Take 1 tablet (75 mg total) by mouth 2 (two) times daily.     Review of Systems      All other systems reviewed and are otherwise negative except as noted above.  Physical Exam    VS:  BP 136/82    Pulse 91    Ht 6' (1.829 m)    Wt 268 lb 1.6 oz (121.6 kg)    SpO2 96%    BMI 36.36 kg/m  , BMI Body mass index is 36.36 kg/m.  Wt Readings from Last 3 Encounters:  05/17/21 268 lb 1.6 oz (121.6 kg)  03/22/21 266 lb (120.7 kg)  01/20/21 270 lb 6.4 oz (122.7 kg)    GEN: Well nourished, overweight, well developed, in no acute distress. HEENT: normal. Neck: Supple, no JVD, carotid bruits, or masses. Cardiac: RRR, no murmurs, rubs, or gallops. No clubbing, cyanosis, edema. Radials/PT 2+ and equal bilaterally.  Respiratory:  Respirations regular and unlabored, clear to auscultation bilaterally. GI: Soft, nontender, nondistended. MS: No deformity or atrophy. Skin: Warm and dry, no rash. Neuro:  Strength and sensation are intact. Psych: Normal affect.  Assessment & Plan    CAD - Stable with no anginal symptoms. No indication  for ischemic evaluation.   GDMT includes Praluent, Aspirin, Metoprolol. Heart healthy diet and regular cardiovascular exercise encouraged.   HFrEF / ICM - 05/13/20 LVEF 40-45%. GDMT includes Toprol (previous intolerance to Carvedilol), Entresto, Lasix PRN. Did not tolerate Jardiance due to frequent yeast infection and patient report of hallucinations. She has not been taking prescribed 97-103mg  dose of Entresto, instead taking 47-51mg  dose.  She is hesitant regarding increasing Entresto as previously noted cramps. Discussed that this was uncommon side effect. She is willing to trial 47-51mg  in the morning and 97-103mg  in the evening for a week then increase to 97-103mg  BID.If she does not tolerate, she will contact. Medication adjustment difficult given multiple medication intolerances. Heart healthy diet and regular cardiovascular exercise encouraged.  Educated to report weight gain of 2 lb overnight or 5 lb in 1 week.  Consider repeat echo 3 months after optimization of HF therapy. Address at follow up.  Right arm swelling - None noted on exam today. Swelling notable in the AM and improves throughout the day. Does sleep on her right side. Encouraged to discuss with primary care. No recent injury. Pulses palpable.   HLD, LDL goal <70 - 10/06/20 LDL 52. Continue Praluent. Intolerance to Rosuvastatin, Simvastatin, Atorvastatin with myalgias.Heart healthy diet and regular cardiovascular exercise encouraged.    HTN - BP not to goal of <130/80. Increase Entresto gradually to 97-103mg  BID, detailed above.   Anxiety - Continue to follow with PCP.   OSA - CPAP compliance encouraged.   Disposition: Follow up in 3 months with Loel Dubonnet, NP   Signed, Loel Dubonnet, NP 05/17/2021, 10:07 PM [place]

## 2021-05-17 NOTE — Patient Instructions (Addendum)
Medication Instructions:  Your physician has recommended you make the following change in your medication:   STOP Jardiance  TRY to take the Entresto 97-103 dose. Start by taking the 49-51 in the morning and 97-103 in the evening for 1 week. Then increase to 97-103mg  twice daily. If you notice cramps or other side effects, please call and let me know or send a MyChart message.   *If you need a refill on your cardiac medications before your next appointment, please call your pharmacy*  Lab Work: None ordered today.   Testing/Procedures: None ordered today.    Follow-Up: At [facility], you and your health needs are our priority.  As part of our continuing mission to provide you with exceptional heart care, we have created designated Provider Care Teams.  These Care Teams include your primary Cardiologist (physician) and Advanced Practice Providers (APPs -  Physician Assistants and Nurse Practitioners) who all work together to provide you with the care you need, when you need it.  We recommend signing up for the patient portal called "MyChart".  Sign up information is provided on this After Visit Summary.  MyChart is used to connect with patients for Virtual Visits (Telemedicine).  Patients are able to view lab/test results, encounter notes, upcoming appointments, etc.  Non-urgent messages can be sent to your provider as well.   To learn more about what you can do with MyChart, go to [email or web address].    Your next appointment:   3 month(s)  The format for your next appointment:   In Person  Provider:   Laurann Montana, NP    Other Instructions  Try using a wrist splint at night to help with arm pain. If you continue to have arm pain recommend following up with your primary care provider.   Heart Healthy Diet Recommendations: A low-salt diet is recommended. Meats should be grilled, baked, or boiled. Avoid fried foods. Focus on lean protein sources like fish or chicken with  vegetables and fruits. The American Heart Association is a Microbiologist!  American Heart Association Diet and Lifeystyle Recommendations    Exercise recommendations: The American Heart Association recommends 150 minutes of moderate intensity exercise weekly. Try 30 minutes of moderate intensity exercise 4-5 times per week. This could include walking, jogging, or swimming.  Recommend weighing daily and keeping a log. Please call our office if you have weight gain of 2 pounds overnight or 5 pounds in 1 week.

## 2021-05-22 ENCOUNTER — Encounter (HOSPITAL_BASED_OUTPATIENT_CLINIC_OR_DEPARTMENT_OTHER): Payer: Self-pay

## 2021-05-23 ENCOUNTER — Telehealth: Payer: Self-pay

## 2021-05-23 NOTE — Telephone Encounter (Signed)
Called and spoke to rep who stated a decision hasn't been made yet. Routing to megan supple rph to make her aware

## 2021-05-23 NOTE — Telephone Encounter (Signed)
-----   Message from Leeroy Bock, [place] sent at 05/20/2021  1:25 PM EST ----- Are you able to follow up on her Praluent appeals? We got a refill request from the pharmacy, probably bc they haven't been able to get the refill to go through

## 2021-05-26 ENCOUNTER — Ambulatory Visit (HOSPITAL_BASED_OUTPATIENT_CLINIC_OR_DEPARTMENT_OTHER): Payer: 59 | Admitting: Family

## 2021-05-26 DIAGNOSIS — I639 Cerebral infarction, unspecified: Secondary | ICD-10-CM | POA: Insufficient documentation

## 2021-05-26 DIAGNOSIS — D497 Neoplasm of unspecified behavior of endocrine glands and other parts of nervous system: Secondary | ICD-10-CM | POA: Insufficient documentation

## 2021-05-26 DIAGNOSIS — D320 Benign neoplasm of cerebral meninges: Secondary | ICD-10-CM | POA: Insufficient documentation

## 2021-05-29 ENCOUNTER — Emergency Department (HOSPITAL_COMMUNITY): Payer: 59

## 2021-05-29 ENCOUNTER — Encounter (HOSPITAL_COMMUNITY): Payer: Self-pay

## 2021-05-29 ENCOUNTER — Other Ambulatory Visit: Payer: Self-pay

## 2021-05-29 ENCOUNTER — Encounter (HOSPITAL_BASED_OUTPATIENT_CLINIC_OR_DEPARTMENT_OTHER): Payer: Self-pay

## 2021-05-29 ENCOUNTER — Emergency Department (HOSPITAL_COMMUNITY)
Admission: EM | Admit: 2021-05-29 | Discharge: 2021-05-29 | Disposition: A | Discharge status: Home / Self Care | Payer: 59 | Attending: Emergency Medicine | Admitting: Emergency Medicine

## 2021-05-29 DIAGNOSIS — I2510 Atherosclerotic heart disease of native coronary artery without angina pectoris: Secondary | ICD-10-CM | POA: Diagnosis not present

## 2021-05-29 DIAGNOSIS — E119 Type 2 diabetes mellitus without complications: Secondary | ICD-10-CM | POA: Diagnosis not present

## 2021-05-29 DIAGNOSIS — Z79899 Other long term (current) drug therapy: Secondary | ICD-10-CM | POA: Insufficient documentation

## 2021-05-29 DIAGNOSIS — M25521 Pain in right elbow: Secondary | ICD-10-CM | POA: Diagnosis not present

## 2021-05-29 DIAGNOSIS — Z853 Personal history of malignant neoplasm of breast: Secondary | ICD-10-CM | POA: Insufficient documentation

## 2021-05-29 DIAGNOSIS — I110 Hypertensive heart disease with heart failure: Secondary | ICD-10-CM | POA: Insufficient documentation

## 2021-05-29 DIAGNOSIS — Z7982 Long term (current) use of aspirin: Secondary | ICD-10-CM | POA: Diagnosis not present

## 2021-05-29 DIAGNOSIS — I5022 Chronic systolic (congestive) heart failure: Secondary | ICD-10-CM | POA: Insufficient documentation

## 2021-05-29 DIAGNOSIS — Z794 Long term (current) use of insulin: Secondary | ICD-10-CM | POA: Diagnosis not present

## 2021-05-29 LAB — CBG MONITORING, ED: Glucose-Capillary: 227 mg/dL — ABNORMAL HIGH (ref 70–99)

## 2021-05-29 MED ORDER — DOXYCYCLINE HYCLATE 100 MG PO CAPS: 100.0000 mg | ORAL_CAPSULE | Freq: Two times a day (BID) | ORAL | 0 refills | Status: DC

## 2021-05-29 MED ORDER — NAPROXEN 500 MG PO TABS
500.0000 mg | ORAL_TABLET | Freq: Once | ORAL | Status: AC
  Administered 2021-05-29: 500 mg via ORAL
  Filled 2021-05-29: qty 1

## 2021-05-29 MED ORDER — NAPROXEN 375 MG PO TABS: 375.0000 mg | ORAL_TABLET | Freq: Two times a day (BID) | ORAL | 0 refills | Status: DC

## 2021-05-29 NOTE — Discharge Instructions (Signed)
Your work-up today was reassuring.  Would like for you to take doxycycline the antibiotic and naproxen which is anti-inflammatory as prescribed.  Please follow-up with your primary care doctor.  Please return to the emergency department for worsening symptoms.

## 2021-05-29 NOTE — ED Provider Notes (Signed)
[facility] DEPT Provider Note   CSN: [id number] Arrival date & time: 05/29/21  1004     History Chief Complaint  Patient presents with   Elbow Pain   Joint Swelling    Destiny Beasley is a 66 y.o. female with past medical history listed below who presents to the emergency department with right elbow pain and swelling that started yesterday.  She denies any trauma or injury to the elbow.  No history of gout.  No fever or chills.  Patient does have pain with movement.  Patient Active Problem List   Diagnosis Date Noted   Unstable angina ([place]) 05/12/2020   Chest pain with moderate risk for cardiac etiology 06/26/2019   History of TIA (transient ischemic attack) 08/08/2017   Malignant neoplasm of breast, stage 4 ([place]) 08/08/2017   History of complete heart block in setting of STEMI 06/2017 06/29/2017   OSA on CPAP 06/29/2017   CAD (coronary artery disease) 06/29/2017   Hx of Infero-Posterior ST Elevation MI in 06/2017 06/25/2017   History of cardiogenic shock in setting of STEMI in 06/2017 83/15/1761   Chronic systolic heart failure ([place]) 06/25/2017   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 Authorizing Provider  doxycycline (VIBRAMYCIN) 100 MG capsule Take 1 capsule (100 mg total) by mouth 2 (two) times daily. 05/29/21  Yes Raul Del, Marianita Botkin M, PA-C  naproxen (NAPROSYN) 375 MG tablet Take 1 tablet (375 mg total) by mouth 2 (two) times daily. 05/29/21  Yes Myna Bright M, PA-C  ADVAIR [facility] 115-21 MCG/ACT inhaler as needed. 12/14/20   [provider]  albuterol (VENTOLIN  HFA) 108 (90 Base) MCG/ACT inhaler SMARTSIG:1-2 Puff(s) By Mouth Every 4 Hours PRN 12/14/20   [provider]  Alirocumab (PRALUENT) 75 MG/ML SOAJ INJECT 1 PEN INTO THE SKIN EVERY 14 DAYS 01/25/21   Sherren Mocha, MD  ALPRAZolam Duanne Moron) 0.25 MG tablet Take 0.25 mg by mouth as needed. 12/09/20   [provider]  aspirin EC 81 MG tablet Take 1 tablet (81 mg total) by mouth daily. 01/01/19   Richardson Dopp T, PA-C  Coenzyme Q10 100 MG capsule Take 100 mg by mouth daily.    [provider]  Continuous Blood Gluc Receiver (FREESTYLE LIBRE 2 READER) DEVI Use reader to monitor blood sugars continuously. 03/23/20   Elayne Snare, MD  Continuous Blood Gluc Sensor (FREESTYLE LIBRE 3 SENSOR) MISC 1 Device by Does not apply route every 14 (fourteen) days. Apply 1 sensor on upper arm every 14 days for continuous glucose monitoring 03/22/21   Elayne Snare, MD  fluconazole (DIFLUCAN) 150 MG tablet Take 150 mg by mouth as needed. 12/30/20   [provider]  furosemide (LASIX) 20 MG tablet TAKE 1 TABLET BY MOUTH  DAILY FOR 3 DAYS THEN TAKE  AS NEEDED FOR FLUID AND  SWELLING 04/04/21   Richardson Dopp T, PA-C  glucose blood (FREESTYLE TEST STRIPS) test strip Use as instructed 06/10/20   Elayne Snare, MD  hydrALAZINE (APRESOLINE) 10 MG tablet Take 10 mg by mouth as needed. 12/28/20   [provider]  INPEN 100-BLUE-LILLY DEVI CONSULT  MD FOR APP SETTINGS. 06/10/20   Elayne Snare, MD  insulin lispro (HUMALOG) 100 UNIT/ML cartridge INJECT SUBCUTANEOUSLY 3 TO 25  UNITS 3 TIMES DAILY WITH MEALS . USE PER SLIDING SCALE 05/10/21   Elayne Snare, MD  LANTUS SOLOSTAR 100 UNIT/ML Solostar Pen INJECT SUBCUTANEOUSLY 70  UNITS DAILY 11/08/20   Elayne Snare, MD  metoprolol succinate (TOPROL-XL) 50 MG 24 hr tablet Take 1 tablet (50 mg total) by mouth daily. Take with or immediately following a meal. 12/14/20 03/14/21  Sherren Mocha, MD  nitroGLYCERIN (NITROSTAT) 0.4 MG SL tablet DISSOLVE ONE TABLET UNDER TONGUE  AS NEEDED FOR CHEST PAIN 05/18/20   Sherren Mocha, MD  Omega-3 Fatty Acids (FISH OIL OMEGA-3 PO) Take 1 tablet by mouth daily.    [provider]  ondansetron (ZOFRAN-ODT) 4 MG disintegrating tablet ondansetron 4 mg disintegrating tablet  TAKE 1 TABLET BY MOUTH EVERY 4 HOURS AS NEEDED FOR NAUSEA    [provider]  pantoprazole (PROTONIX) 40 MG tablet Take 40 mg by mouth daily. 12/06/20   [provider]  sacubitril-valsartan (ENTRESTO) 97-103 MG Take 1 tablet by mouth 2 (two) times daily. Patient not taking: Reported on 05/17/2021 01/10/21   Loel Dubonnet, NP  sertraline (ZOLOFT) 50 MG tablet sertraline 50 mg tablet    [provider]  tirzepatide Darcel Bayley) 5 MG/0.5ML Pen INJECT 5MG  UNDER THE SKIN ONCE WEEKLY AND INCREASE TO 0.75MG  AFTER 3RD INJECTION IF TOLERATED Patient not taking: Reported on 05/17/2021 04/08/21   Elayne Snare, MD  valACYclovir (VALTREX) 1000 MG tablet Take 1,000 mg by mouth 2 (two) times daily as needed (infection). 05/02/20   [provider]  venlafaxine (EFFEXOR) 75 MG tablet Take 1 tablet (75 mg total) by mouth 2 (two) times daily. 12/03/20   Derrill Center, NP      Allergies    Hydrocodone, Invokana [canagliflozin], Amlodipine, Carvedilol, Chlorthalidone, Evolocumab, Hydralazine, Imdur [isosorbide nitrate], Jardiance [empagliflozin], Rosuvastatin, Simvastatin, Spironolactone, Atorvastatin, and Compazine [prochlorperazine]    Review of Systems   Review of Systems  All other systems reviewed and are negative.  Physical Exam Updated Vital Signs BP (!) 183/101 (BP Location: Left Arm)    Pulse 90    Temp 97.9 F (36.6 C) (Oral)    Resp 18    Ht 6' (1.829 m)    Wt 118.8 kg    SpO2 97%    BMI 35.53 kg/m  Physical Exam Vitals and nursing note reviewed.  Constitutional:      Appearance: Normal appearance.  HENT:     Head: Normocephalic and atraumatic.  Eyes:     General:        Right eye: No discharge.        Left eye: No  discharge.     Conjunctiva/sclera: Conjunctivae normal.  Pulmonary:     Effort: Pulmonary effort is normal.  Musculoskeletal:     Comments: Right elbow is warm to palpation.  There is mild swelling.  She is limited to about 60 degrees of flexion.  Patient unable to fully extend the arm secondary to pain.  There is no focal tenderness on the bony prominences of the elbow. Patient able to pronate and supinate without difficulty.  Skin:    General: Skin is warm and dry.     Findings: No rash.  Neurological:     General: No focal deficit present.     Mental Status: She is alert.  Psychiatric:        Mood and Affect: Mood  normal.        Behavior: Behavior normal.    ED Results / Procedures / Treatments   Labs (all labs ordered are listed, but only abnormal results are displayed) Labs Reviewed  CBG MONITORING, ED - Abnormal; Notable for the following components:      Result Value   Glucose-Capillary 227 (*)    All other components within normal limits    EKG None  Radiology DG Elbow Complete Right  Result Date: 05/29/2021 CLINICAL DATA:  Elbow pain and swelling.  No history of trauma. EXAM: RIGHT ELBOW - COMPLETE 3+ VIEW COMPARISON:  None. FINDINGS: There is no evidence of fracture or dislocation. There is joint effusion with amorphous calcification about the anterior aspect of the humerus. IMPRESSION: 1.  No evidence of fracture or dislocation. 2. Small joint effusion with amorphous calcification, findings suggestive of degenerative or inflammatory arthropathy. Clinical correlation is suggested. MRI examination could be considered for further evaluation if clinically warranted. Electronically Signed   By: Keane Police D.O.   On: 05/29/2021 11:52    Procedures Procedures    Medications Ordered in ED Medications  naproxen (NAPROSYN) tablet 500 mg (500 mg Oral Given 05/29/21 1142)    ED Course/ Medical Decision Making/ A&P                           Medical Decision Making Amount  and/or Complexity of Data Reviewed Radiology: ordered.  Risk Prescription drug management.   Destiny Beasley is a 66 y.o. female with significant comorbidities to impact her care to include diabetes, hypertension, malignancy of the breast who presents the emergency department with right elbow pain.  I considered however believe less likely fracture dislocation.  I doubt significant tendinopathy or tear.  I doubt septic joint.  This could be gout although patient does not have a history of this.  I do not see any synovial joint aspiration records.  Will plan to get imaging.  I will also give the patient naproxen. I will plan to reassess.  I personally reviewed the imaging which was negative for any fractures location.  There is some mild swelling.  I do agree to with the radiologist interpretation.  I do not feel that MRI is necessary at this time.  Again I have a low suspicion for septic joint.  This could be just bursitis versus arthritis.  We will give her antibiotics and anti-inflammatories to go home with.  She has a follow-up with her doctor this week.  I told her to keep that appointment.  Strict return precautions given.  All questions or concerns addressed.  She is safe for discharge.   Final Clinical Impression(s) / ED Diagnoses Final diagnoses:  Right elbow pain    Rx / DC Orders ED Discharge Orders          Ordered    doxycycline (VIBRAMYCIN) 100 MG capsule  2 times daily        05/29/21 1226    naproxen (NAPROSYN) 375 MG tablet  2 times daily        05/29/21 [address] [place], [place] 05/29/21 1228    Malvin Johns, MD 05/29/21 1531

## 2021-05-29 NOTE — ED Triage Notes (Signed)
Patient reports that she began having right elbow pain and swelling since yesterday. Patient denies any injury.

## 2021-06-01 MED ORDER — PRALUENT 75 MG/ML ~~LOC~~ SOAJ: SUBCUTANEOUS | 3 refills | Status: DC

## 2021-06-01 NOTE — Telephone Encounter (Signed)
Received fax with approval for Praluent through 05/31/22. Refill sent in to pharmacy.

## 2021-06-08 ENCOUNTER — Encounter (HOSPITAL_COMMUNITY): Payer: Self-pay

## 2021-06-08 ENCOUNTER — Other Ambulatory Visit: Payer: Self-pay

## 2021-06-08 ENCOUNTER — Emergency Department (HOSPITAL_COMMUNITY)
Admission: EM | Admit: 2021-06-08 | Discharge: 2021-06-08 | Disposition: A | Discharge status: Home / Self Care | Payer: 59 | Attending: Emergency Medicine | Admitting: Emergency Medicine

## 2021-06-08 ENCOUNTER — Telehealth: Payer: Self-pay

## 2021-06-08 ENCOUNTER — Encounter (HOSPITAL_BASED_OUTPATIENT_CLINIC_OR_DEPARTMENT_OTHER): Payer: Self-pay

## 2021-06-08 ENCOUNTER — Emergency Department (HOSPITAL_COMMUNITY): Payer: 59

## 2021-06-08 DIAGNOSIS — M546 Pain in thoracic spine: Secondary | ICD-10-CM | POA: Insufficient documentation

## 2021-06-08 DIAGNOSIS — R0689 Other abnormalities of breathing: Secondary | ICD-10-CM | POA: Diagnosis not present

## 2021-06-08 DIAGNOSIS — R0789 Other chest pain: Secondary | ICD-10-CM | POA: Diagnosis not present

## 2021-06-08 DIAGNOSIS — Z7982 Long term (current) use of aspirin: Secondary | ICD-10-CM | POA: Insufficient documentation

## 2021-06-08 DIAGNOSIS — R079 Chest pain, unspecified: Secondary | ICD-10-CM | POA: Diagnosis present

## 2021-06-08 DIAGNOSIS — K5730 Diverticulosis of large intestine without perforation or abscess without bleeding: Secondary | ICD-10-CM | POA: Diagnosis not present

## 2021-06-08 DIAGNOSIS — R202 Paresthesia of skin: Secondary | ICD-10-CM | POA: Insufficient documentation

## 2021-06-08 DIAGNOSIS — I10 Essential (primary) hypertension: Secondary | ICD-10-CM | POA: Insufficient documentation

## 2021-06-08 DIAGNOSIS — Z79899 Other long term (current) drug therapy: Secondary | ICD-10-CM | POA: Insufficient documentation

## 2021-06-08 DIAGNOSIS — Z794 Long term (current) use of insulin: Secondary | ICD-10-CM | POA: Diagnosis not present

## 2021-06-08 LAB — CBC WITH DIFFERENTIAL/PLATELET
Abs Immature Granulocytes: 0.01 10*3/uL (ref 0.00–0.07)
Basophils Absolute: 0 10*3/uL (ref 0.0–0.1)
Basophils Relative: 1 %
Eosinophils Absolute: 0.4 10*3/uL (ref 0.0–0.5)
Eosinophils Relative: 7 %
HCT: 46.8 % — ABNORMAL HIGH (ref 36.0–46.0)
Hemoglobin: 15.1 g/dL — ABNORMAL HIGH (ref 12.0–15.0)
Immature Granulocytes: 0 %
Lymphocytes Relative: 41 %
Lymphs Abs: 2.4 10*3/uL (ref 0.7–4.0)
MCH: 29 pg (ref 26.0–34.0)
MCHC: 32.3 g/dL (ref 30.0–36.0)
MCV: 90 fL (ref 80.0–100.0)
Monocytes Absolute: 0.4 10*3/uL (ref 0.1–1.0)
Monocytes Relative: 7 %
Neutro Abs: 2.6 10*3/uL (ref 1.7–7.7)
Neutrophils Relative %: 44 %
Platelets: 279 10*3/uL (ref 150–400)
RBC: 5.2 MIL/uL — ABNORMAL HIGH (ref 3.87–5.11)
RDW: 13.9 % (ref 11.5–15.5)
WBC: 5.9 10*3/uL (ref 4.0–10.5)
nRBC: 0 % (ref 0.0–0.2)

## 2021-06-08 LAB — COMPREHENSIVE METABOLIC PANEL
ALT: 22 U/L (ref 0–44)
AST: 23 U/L (ref 15–41)
Albumin: 3.7 g/dL (ref 3.5–5.0)
Alkaline Phosphatase: 128 U/L — ABNORMAL HIGH (ref 38–126)
Anion gap: 7 (ref 5–15)
BUN: 14 mg/dL (ref 8–23)
CO2: 27 mmol/L (ref 22–32)
Calcium: 8.7 mg/dL — ABNORMAL LOW (ref 8.9–10.3)
Chloride: 102 mmol/L (ref 98–111)
Creatinine, Ser: 0.88 mg/dL (ref 0.44–1.00)
GFR, Estimated: >60 mL/min (ref >60)
Glucose, Bld: 228 mg/dL — ABNORMAL HIGH (ref 70–99)
Potassium: 3.9 mmol/L (ref 3.5–5.1)
Sodium: 136 mmol/L (ref 135–145)
Total Bilirubin: 0.3 mg/dL (ref 0.3–1.2)
Total Protein: 7.5 g/dL (ref 6.5–8.1)

## 2021-06-08 LAB — PROTIME-INR
INR: 1 (ref 0.8–1.2)
Prothrombin Time: 13.4 seconds (ref 11.4–15.2)

## 2021-06-08 LAB — MAGNESIUM: Magnesium: 1.8 mg/dL (ref 1.7–2.4)

## 2021-06-08 LAB — TROPONIN I (HIGH SENSITIVITY)
Troponin I (High Sensitivity): 7 ng/L (ref <18)
Troponin I (High Sensitivity): 6 ng/L (ref <18)

## 2021-06-08 LAB — BRAIN NATRIURETIC PEPTIDE: B Natriuretic Peptide: 46.6 pg/mL (ref 0.0–100.0)

## 2021-06-08 MED ORDER — ASPIRIN 81 MG PO CHEW: 324.0000 mg | CHEWABLE_TABLET | Freq: Once | ORAL | Status: DC

## 2021-06-08 MED ORDER — IOHEXOL 350 MG/ML SOLN
70.0000 mL | Freq: Once | INTRAVENOUS | Status: AC | PRN
  Administered 2021-06-08: 70 mL via INTRAVENOUS

## 2021-06-08 NOTE — ED Triage Notes (Signed)
Pt bib [facility] from home with complaints of chest pain that started at 6am this morning. Pt complains of cp, shob, dizziness, lightheaded, sweats after the pain started. Pt states she did have back pain that started yesterday. Pt took 1ASA, 1Nitro, used her inhaler, and took 1 Xanax prior to ems arrival. EMS gave an additional ASA for a total of 324mg , and one more Nitro.  EMS vitals: 145/92, 86 HR, 97% RA, 18 R, 229 CBG

## 2021-06-08 NOTE — Discharge Instructions (Addendum)
As discussed, your evaluation today has been largely reassuring.  But, it is important that you monitor your condition carefully, and do not hesitate to return to the ED if you develop new, or concerning changes in your condition. ? ?Otherwise, please follow-up with your physician for appropriate ongoing care. ? ?

## 2021-06-08 NOTE — Telephone Encounter (Signed)
Received fax from pharmacy requesting refill on Victoza 18MG /3ML injecting 1.2 daily but didn't see this on last office visit. Please advise.

## 2021-06-08 NOTE — ED Provider Notes (Signed)
[facility] EMERGENCY DEPARTMENT Provider Note   CSN: [id number] Arrival date & time: 06/08/21  3419     History  Chief Complaint  Patient presents with   Chest Pain    Destiny Beasley is a 66 y.o. female.  HPI Patient presents with pain that awakened her today.  She has multiple medical issues including MI, stent placement, states that she been taking her medication regularly.  She is a non-smoker.  She woke with pain in her mid back, subsequently developed pain in her chest, and radiating down the left arm with numbness.  Symptoms have improved somewhat with nitroglycerin and aspirin.  EMS reports hypertension in route.    Home Medications Prior to Admission medications   Medication Sig Start Date End Date Taking? Authorizing Provider  ADVAIR [facility] 115-21 MCG/ACT inhaler as needed. 12/14/20   [provider]  albuterol (VENTOLIN HFA) 108 (90 Base) MCG/ACT inhaler SMARTSIG:1-2 Puff(s) By Mouth Every 4 Hours PRN 12/14/20   [provider]  Alirocumab (PRALUENT) 75 MG/ML SOAJ INJECT 1 PEN INTO THE SKIN EVERY 14 DAYS 06/01/21   Sherren Mocha, MD  ALPRAZolam Duanne Moron) 0.25 MG tablet Take 0.25 mg by mouth as needed. 12/09/20   [provider]  aspirin EC 81 MG tablet Take 1 tablet (81 mg total) by mouth daily. 01/01/19   Richardson Dopp T, PA-C  Coenzyme Q10 100 MG capsule Take 100 mg by mouth daily.    [provider]  Continuous Blood Gluc Receiver (FREESTYLE LIBRE 2 READER) DEVI Use reader to monitor blood sugars continuously. 03/23/20   Elayne Snare, MD  Continuous Blood Gluc Sensor (FREESTYLE LIBRE 3 SENSOR) MISC 1 Device by Does not apply route every 14 (fourteen) days. Apply 1 sensor on upper arm every 14 days for continuous glucose monitoring 03/22/21   Elayne Snare, MD  doxycycline (VIBRAMYCIN) 100 MG capsule Take 1 capsule (100 mg total) by mouth 2 (two) times daily. 05/29/21   Myna Bright M, PA-C  fluconazole (DIFLUCAN) 150 MG tablet  Take 150 mg by mouth as needed. 12/30/20   [provider]  furosemide (LASIX) 20 MG tablet TAKE 1 TABLET BY MOUTH  DAILY FOR 3 DAYS THEN TAKE  AS NEEDED FOR FLUID AND  SWELLING 04/04/21   Richardson Dopp T, PA-C  glucose blood (FREESTYLE TEST STRIPS) test strip Use as instructed 06/10/20   Elayne Snare, MD  hydrALAZINE (APRESOLINE) 10 MG tablet Take 10 mg by mouth as needed. 12/28/20   [provider]  INPEN 100-BLUE-LILLY DEVI CONSULT MD FOR APP SETTINGS. 06/10/20   Elayne Snare, MD  insulin lispro (HUMALOG) 100 UNIT/ML cartridge INJECT SUBCUTANEOUSLY 3 TO 25  UNITS 3 TIMES DAILY WITH MEALS . USE PER SLIDING SCALE 05/10/21   Elayne Snare, MD  LANTUS SOLOSTAR 100 UNIT/ML Solostar Pen INJECT SUBCUTANEOUSLY 70  UNITS DAILY 11/08/20   Elayne Snare, MD  metoprolol succinate (TOPROL-XL) 50 MG 24 hr tablet Take 1 tablet (50 mg total) by mouth daily. Take with or immediately following a meal. 12/14/20 03/14/21  Sherren Mocha, MD  naproxen (NAPROSYN) 375 MG tablet Take 1 tablet (375 mg total) by mouth 2 (two) times daily. 05/29/21   Myna Bright M, PA-C  nitroGLYCERIN (NITROSTAT) 0.4 MG SL tablet DISSOLVE ONE TABLET UNDER TONGUE AS NEEDED FOR CHEST PAIN 05/18/20   Sherren Mocha, MD  Omega-3 Fatty Acids (FISH OIL OMEGA-3 PO) Take 1 tablet by mouth daily.    [provider]  ondansetron (ZOFRAN-ODT) 4 MG disintegrating  tablet ondansetron 4 mg disintegrating tablet  TAKE 1 TABLET BY MOUTH EVERY 4 HOURS AS NEEDED FOR NAUSEA    [provider]  pantoprazole (PROTONIX) 40 MG tablet Take 40 mg by mouth daily. 12/06/20   [provider]  sacubitril-valsartan (ENTRESTO) 97-103 MG Take 1 tablet by mouth 2 (two) times daily. Patient not taking: Reported on 05/17/2021 01/10/21   Loel Dubonnet, NP  sertraline (ZOLOFT) 50 MG tablet sertraline 50 mg tablet    [provider]  tirzepatide Darcel Bayley) 5 MG/0.5ML Pen INJECT 5MG  UNDER THE SKIN ONCE WEEKLY AND INCREASE TO 0.75MG   AFTER 3RD INJECTION IF TOLERATED Patient not taking: Reported on 05/17/2021 04/08/21   Elayne Snare, MD  valACYclovir (VALTREX) 1000 MG tablet Take 1,000 mg by mouth 2 (two) times daily as needed (infection). 05/02/20   [provider]  venlafaxine (EFFEXOR) 75 MG tablet Take 1 tablet (75 mg total) by mouth 2 (two) times daily. 12/03/20   Derrill Center, NP      Allergies    Hydrocodone, Invokana [canagliflozin], Amlodipine, Carvedilol, Chlorthalidone, Evolocumab, Hydralazine, Imdur [isosorbide nitrate], Jardiance [empagliflozin], Rosuvastatin, Simvastatin, Spironolactone, Atorvastatin, and Compazine [prochlorperazine]    Review of Systems   Review of Systems  Constitutional:        Per HPI, otherwise negative  HENT:         Per HPI, otherwise negative  Respiratory:         Per HPI, otherwise negative  Cardiovascular:        Per HPI, otherwise negative  Gastrointestinal:  Negative for vomiting.  Endocrine:       Negative aside from HPI  Genitourinary:        Neg aside from HPI   Musculoskeletal:        Per HPI, otherwise negative  Skin: Negative.   Neurological:  Negative for syncope.   Physical Exam Updated Vital Signs BP (!) 159/83    Pulse 76    Temp 98 F (36.7 C) (Oral)    Resp 18    Ht 6' (1.829 m)    Wt 117.9 kg    SpO2 100%    BMI 35.26 kg/m  Physical Exam Vitals and nursing note reviewed.  Constitutional:      General: She is not in acute distress.    Appearance: She is well-developed.  HENT:     Head: Normocephalic and atraumatic.  Eyes:     Conjunctiva/sclera: Conjunctivae normal.  Cardiovascular:     Rate and Rhythm: Normal rate and regular rhythm.     Comments: Symmetric upper extremity pulses Pulmonary:     Effort: Pulmonary effort is normal. No respiratory distress.     Breath sounds: Normal breath sounds. No stridor.  Abdominal:     General: There is no distension.  Skin:    General: Skin is warm and dry.  Neurological:     Mental Status: She  is alert and oriented to person, place, and time.     Cranial Nerves: No cranial nerve deficit.     Comments: No focal neurologic deficits upper extremities, face symmetric, speech clear    ED Results / Procedures / Treatments   Labs (all labs ordered are listed, but only abnormal results are displayed) Labs Reviewed  COMPREHENSIVE METABOLIC PANEL  MAGNESIUM  BRAIN NATRIURETIC PEPTIDE  CBC WITH DIFFERENTIAL/PLATELET  PROTIME-INR  CBG MONITORING, ED  TROPONIN I (HIGH SENSITIVITY)    EKG EKG Interpretation  Date/Time:  Wednesday June 08 2021 09:39:32 EST Ventricular Rate:  71 PR Interval:  196 QRS Duration: 86 QT Interval:  397 QTC Calculation: 432 R Axis:   55 Text Interpretation: Sinus rhythm Inferior infarct, age indeterminate Abnormal lateral Q waves T wave abnormality similar to prior studies Abnormal ECG Confirmed by Carmin Muskrat [phone]) on 06/08/2021 9:51:57 AM  Radiology No results found.  Procedures Procedures    Medications Ordered in ED Medications  aspirin chewable tablet 324 mg (has no administration in time range)    ED Course/ Medical Decision Making/ A&P  This patient presents to the ED for concern of chest pain, back pain, left arm numbness, this involves an extensive number of treatment options, and is a complaint that carries with it a high risk of complications and morbidity.  The differential diagnosis includes unstable angina, acute aortic dissection, pneumonia, pneumothorax, PE   Co morbidities that complicate the patient evaluation  Hypertension, CAD   Social Determinants of Health:  Stressful   Additional history obtained:  Additional history and/or information obtained from chart review External records from outside source obtained and reviewed including cardiology post STEMI notes, EMS notes as above   After the initial evaluation, orders, including: Labs, x-ray, CT were initiated.  Heart score 3  Patient placed on Cardiac  and Pulse-Oximetry Monitors. The patient was maintained on a cardiac monitor.  The cardiac monitored showed an rhythm of sinus rhythm, 70s, The patient was also maintained on pulse oximetry. The readings were typically 99% room air normal  On repeat evaluation of the patient stayed the same  Lab Tests:  I personally interpreted labs.  The pertinent results include: 2 troponins that are normal, no substantial electrolyte abnormalities, no elevated BNP  Imaging Studies ordered:  I independently visualized and interpreted imaging which showed no cardiomegaly, no pulmonary congestion and importantly, CT angiography to exclude dissection was reassuring, no evidence for this nor aortic disruption/aneurysm I agree with the radiologist interpretation    Dispostion / Final MDM:  After consideration of the diagnostic results and the patient's response to treatment, she is asymptomatic remains hemodynamically unremarkable, and as above given broad differential including ACS, aortic dissection, pneumonia, pneumothorax was considered, findings thus far reassuring, and without ongoing symptoms, without hypertension, without ongoing complaints patient I discussed discharge with close outpatient follow-up to which she is amenable.  Hospitalization was considered given the patient's heart score of 3, but was 2 normal troponin, reassuring findings as above, patient appropriate for discharge.   Final Clinical Impression(s) / ED Diagnoses Atypical chest pain   Carmin Muskrat, MD 06/08/21 1416

## 2021-06-09 ENCOUNTER — Other Ambulatory Visit (HOSPITAL_COMMUNITY): Payer: Self-pay

## 2021-06-09 NOTE — Telephone Encounter (Signed)
Ok that's what I thought thanks.

## 2021-06-15 ENCOUNTER — Other Ambulatory Visit (HOSPITAL_COMMUNITY): Payer: Self-pay

## 2021-06-16 ENCOUNTER — Other Ambulatory Visit: Payer: 59

## 2021-06-21 ENCOUNTER — Ambulatory Visit: Payer: 59 | Admitting: Endocrinology

## 2021-06-24 ENCOUNTER — Other Ambulatory Visit: Payer: Self-pay | Admitting: Orthopedic Surgery

## 2021-06-24 DIAGNOSIS — M25561 Pain in right knee: Secondary | ICD-10-CM

## 2021-06-29 ENCOUNTER — Encounter (HOSPITAL_BASED_OUTPATIENT_CLINIC_OR_DEPARTMENT_OTHER): Payer: Self-pay

## 2021-06-29 ENCOUNTER — Other Ambulatory Visit: Payer: Self-pay

## 2021-06-29 ENCOUNTER — Ambulatory Visit
Admission: RE | Admit: 2021-06-29 | Discharge: 2021-06-29 | Disposition: A | Discharge status: Home / Self Care | Payer: 59 | Source: Ambulatory Visit | Attending: Orthopedic Surgery | Admitting: Orthopedic Surgery

## 2021-06-29 DIAGNOSIS — M25561 Pain in right knee: Secondary | ICD-10-CM

## 2021-06-29 MED ORDER — PRALUENT 75 MG/ML ~~LOC~~ SOAJ: SUBCUTANEOUS | 3 refills | Status: DC

## 2021-07-05 ENCOUNTER — Encounter (HOSPITAL_BASED_OUTPATIENT_CLINIC_OR_DEPARTMENT_OTHER): Payer: Self-pay

## 2021-07-06 ENCOUNTER — Encounter (HOSPITAL_BASED_OUTPATIENT_CLINIC_OR_DEPARTMENT_OTHER): Payer: Self-pay

## 2021-07-06 MED ORDER — ENTRESTO 49-51 MG PO TABS: 1.0000 | ORAL_TABLET | Freq: Two times a day (BID) | ORAL | 11 refills | Status: DC

## 2021-08-02 NOTE — Progress Notes (Addendum)
? ?Office Visit  ?  ?Patient Name: [name] ?Date of Encounter: 08/03/2021 ? ?PCP:  Lawerance Cruel, MD ?  ?[place]  ?Cardiologist:  Sherren Mocha, MD  ?Advanced Practice Provider:  Liliane Shi, PA-C ?Electrophysiologist:  None  ? ? ?Chief Complaint  ?  ?Destiny Beasley is a 66 y.o. female with a hx of CAD s/p inferior-lateral STEMI 06/2017 with DES to LCx, HFrEF, ICM, DM2, TIA, OSA, breast cancer, HTN, HLD with statin intolerance, COVID19, pituitary tumor s/p resection, meningioma, anxiety presents today for HFrEF, HTN follow up. ? ?Past Medical History  ?  ?Past Medical History:  ?Diagnosis Date  ? Anemia   ? Anxiety   ? CAD in native artery   ? a. Inf STEMI 05/5954 complicated by cardiogenic shock/complete heart block s/p emergent cath showing culprit large dominant LCx s/p aspiration thrombectomy and DES, EF 50% by cath, 40-45% by echo.  ? Chronic systolic heart failure ([place]) 06/25/2017  ? Echo 11/19: mild LVH, EF 40-45, inf-lat HK, Gr 1 DD, trivial MR  ? Complete heart block, transient ([place])   ? a. 06/2017 in setting of acute inf MI -> resolved after PCI.  ? COVID-19   ? Diabetes mellitus   ? Goiter   ? History of radiation therapy 06/14/2016 - 07/19/2016  ? Right Breast 50 Gy 25 fractions  ? Hypercholesteremia   ? Hypertension   ? Ischemic cardiomyopathy   ? a. EF 50% by cath and 40-45% by echo 06/2017.  ? Leg pain   ? ABIs 07/2019: Normal (R 1.27; L 1.25)  ? Malignant neoplasm of upper-outer quadrant of right female breast ([place]) 02/24/2016  ? Meningioma ([place])   ? Nuclear stress test   ? Nuclear stress test 10/19: EF 35, inferolateral, apical scar; inferior, apical inferior infarct with mild peri-infarct ischemia; high risk  ? Obesity   ? Personal history of chemotherapy   ? Personal history of radiation therapy   ? Pituitary tumor   ? Seasonal asthma   ? Sleep apnea   ? does not use every night  ? Stroke [facility])   ? TIA - Dec 1 st, 2017  ? ?Past Surgical History:  ?Procedure  Laterality Date  ? ABDOMINAL HYSTERECTOMY    ? BRAIN MENINGIOMA EXCISION  2005  ? BREAST BIOPSY    ? BREAST LUMPECTOMY Right   ? 2017  ? BREAST LUMPECTOMY WITH RADIOACTIVE SEED LOCALIZATION Right 04/20/2016  ? Procedure: RIGHT BREAST LUMPECTOMY WITH RADIOACTIVE SEED LOCALIZATION;  Surgeon: Fanny Skates, MD;  Location: [facility];  Service: General;  Laterality: Right;  ? BREAST REDUCTION SURGERY Bilateral 10/23/2016  ? Procedure: BILATERAL BREAST REDUCTION WITH LIPOSUCTION ASSISTANCE;  Surgeon: Cristine Polio, MD;  Location: [place];  Service: Plastics;  Laterality: Bilateral;  ? CESAREAN SECTION    ? CORONARY STENT INTERVENTION N/A 06/25/2017  ? Procedure: CORONARY STENT INTERVENTION;  Surgeon: Sherren Mocha, MD;  Location: [facility];  Service: Cardiovascular;  Laterality: N/A;  ? CORONARY/GRAFT ACUTE MI REVASCULARIZATION N/A 06/25/2017  ? Procedure: Coronary/Graft Acute MI Revascularization;  Surgeon: Sherren Mocha, MD;  Location: [place] [facility];  Service: Cardiovascular;  Laterality: N/A;  ? FOOT SURGERY Bilateral 2016  ? hammer toe surgery  ? LEFT HEART CATH AND CORONARY ANGIOGRAPHY N/A 06/25/2017  ? Procedure: LEFT HEART CATH AND CORONARY ANGIOGRAPHY;  Surgeon: Sherren Mocha, MD;  Location: [place] [facility];  Service: Cardiovascular;  Laterality: N/A;  ?  LEFT HEART CATH AND CORONARY ANGIOGRAPHY N/A 05/13/2020  ? Procedure: LEFT HEART CATH AND CORONARY ANGIOGRAPHY;  Surgeon: Lorretta Harp, MD;  Location: [place] [facility];  Service: Cardiovascular;  Laterality: N/A;  ? MENISCUS REPAIR Left 2015  ? PITUITARY SURGERY    ? REDUCTION MAMMAPLASTY    ? RIGHT HEART CATH N/A 06/25/2017  ? Procedure: RIGHT HEART CATH;  Surgeon: Sherren Mocha, MD;  Location: [place] [facility];  Service: Cardiovascular;  Laterality: N/A;  ? THYROID SURGERY    ? ? ?Allergies ? ?Allergies  ?Allergen Reactions  ? Hydrocodone Other (See Comments)  ?  Severe anxiety ?Confusion ?  ?  Invokana [Canagliflozin] Other (See Comments)  ?  Constant yeast infections  ? Amlodipine Other (See Comments)  ?  Muscles hurt  ? Carvedilol Other (See Comments)  ?  Wheezing with '25mg'$  BID dosing ?NOT ALLERGIC 10/08/20  ? Chlorthalidone Other (See Comments)  ?  Leg pain  ? Evolocumab Other (See Comments)  ?  Injection site pain  ? Hydralazine Other (See Comments)  ?  Fatigue  ? Imdur [Isosorbide Nitrate] Other (See Comments)  ?  Patient reported wheezing  ? Jardiance [Empagliflozin] Other (See Comments)  ?  lightheadedness, weakness, low BP and yeast infection.  ?NOT ALLERGIC 10/08/20  ? Rosuvastatin Other (See Comments)  ?  myalgias  ? Simvastatin Other (See Comments)  ?  myalgias  ? Spironolactone Other (See Comments)  ?  Wheezing vs fatigue?  ? Atorvastatin Other (See Comments)  ?  DIZZINESS  ? Compazine [Prochlorperazine] Anxiety  ? ? ?History of Present Illness  ?  ?Destiny Beasley is a 66 y.o. female with a hx of CAD s/p inferior-lateral STEMI 06/2017 with DES to LCx, HFrEF, ICM, DM2, hyperthyroidism, TIA, OSA, breast cancer, HTN, HLD with statin intolerance, COVID19, pituitary tumor s/p resection, meningioma, anxiety last seen  05/17/21.  ? ?CAD dates back to inf-lat STEMI 06/2017 with DES to pLCx. Cardiac catheterization 04/2020 with patent LCx stent and pLAD with 40% stenosis. Her LVEF since her STEMI has been 40-45%.  ? ?She was seen via telemedicine 08/2020 and started on Lasix due to volume excess. She was seen6/17/22 with chief complaint of hot flashes which she attributed to Carvedilol. As she was being discharged from the office she developed chest pain and shortness of breath. EKG was without acute changes. BP as high as 200/100. She was transported via EMS to ED. Symptoms resolved with nitroglycerin and Xanax. Hs-troponin negative x2, d-dimer minimally elevated 0.62 though negative when adjusted for age. Head CT unremarkable.  ? ?ED visit 10/19/20 was COVID positive treated with antiviral. Visit 11/01/20,  12/14/20, and 12/28/20 with optimization of antihypertensive regimen.  Seen 01/07/21 and [name] increased to 97-'103mg'$  BID. She has not been seen since that time.  ? ?She was seen 05/17/21 after reporting hallucinations (seeing people in her peripheral vision were not present) and right arm/hand swelling.  Her pulses were palpable and she was encouraged to follow-up with primary care regarding her arm.  She had self discontinued her Jardiance due to intermittent yeast infections per her report and her hallucinations resolved.  Her blood pressure was above goal and her [name] was increased to 97-103 mg twice daily. ? ?ED visit 05/29/21 with x-ray showing mild joint effusion treated with an biotics and anti-inflammatories due to possible early cellulitis.  ED visit 06/08/2021 with chest pain waking her from sleep. HS troponinx2 abnormality, normal BMP/BNP, CTA with no PE. She sent MyChart message  07/05/21 nothing muscle pain with Entresto and dose reduced to 49-'51mg'$  BID.  ? ?Presents today for follow up. Her grandson is participating in travel basketball which she enjoys . Upcoming trip to [facility] for Mother's Day. She has been feeling overall well since last seen. Checking BP at home with readings usually 160s/70s. BP has been difficult to control due to multiple medication intolerances. Recent had athroscopy of  her R knee due to meniscus tear and is recuperating well. Hopeful to get back to more exercise. Reports no shortness of breath nor dyspnea on exertion. Reports no chest pain, pressure, or tightness. No edema, orthopnea, PND. Reports no palpitations.  Using PRN Lasix only once per week at most. ? ?Previous medication intolerances: ?Amlodipine (muscle pain) ?Coreg (wheeze with '25mg'$  dose) ?Chlorthalidone (leg pain) ?Spironolactone (wheeze/fatigue) ?Jardiance (hallucinations, yeast infections) ?Entresto (intolerant to 97-'103mg'$  dose with cramps, tolerates 49-'51mg'$  dose) ?Hydralazine (fatigue) ?Imdur  (wheeze) ? ?EKGs/Labs/Other Studies Reviewed:  ? ?The following studies were reviewed today: ?Echocardiogram 05/13/20 ?EF 40-45, inf and inf-lat HK, normal RVSF, trivial MR, trivial AI ?  ?Cardiac catheterization 05/13/20 ?LAD prox 40

## 2021-08-03 ENCOUNTER — Encounter (HOSPITAL_BASED_OUTPATIENT_CLINIC_OR_DEPARTMENT_OTHER): Payer: Self-pay | Admitting: Family

## 2021-08-03 ENCOUNTER — Ambulatory Visit (INDEPENDENT_AMBULATORY_CARE_PROVIDER_SITE_OTHER): Payer: 59 | Admitting: Family

## 2021-08-03 VITALS — BP 160/78 | HR 81 | Ht 72.0 in | Wt 266.6 lb

## 2021-08-03 DIAGNOSIS — E785 Hyperlipidemia, unspecified: Secondary | ICD-10-CM | POA: Diagnosis not present

## 2021-08-03 DIAGNOSIS — I25118 Atherosclerotic heart disease of native coronary artery with other forms of angina pectoris: Secondary | ICD-10-CM | POA: Diagnosis not present

## 2021-08-03 DIAGNOSIS — I5020 Unspecified systolic (congestive) heart failure: Secondary | ICD-10-CM | POA: Diagnosis not present

## 2021-08-03 DIAGNOSIS — G4733 Obstructive sleep apnea (adult) (pediatric): Secondary | ICD-10-CM | POA: Diagnosis not present

## 2021-08-03 DIAGNOSIS — I10 Essential (primary) hypertension: Secondary | ICD-10-CM

## 2021-08-03 MED ORDER — DOXAZOSIN MESYLATE 2 MG PO TABS: 2.0000 mg | ORAL_TABLET | Freq: Every day | ORAL | 1 refills | Status: DC

## 2021-08-03 NOTE — Patient Instructions (Addendum)
Medication Instructions:  ?Your physician has recommended you make the following change in your medication:  ? ?START Doxazosin one '2mg'$  tablet daily ? ?Send [place] a MyChart message in about 2 weeks to let us know how your blood pressure is doing. If we continue having difficulties with your blood pressure we may refer you to our Advanced Hypertension Clinic.  ? ?*If you need a refill on your cardiac medications before your next appointment, please call your pharmacy* ? ? ?Lab Work: ?None ordered today.  ? ?Testing/Procedures: ? ?Your physician has requested that you have an echocardiogram prior to your next appointment. Echocardiography is a painless test that uses sound waves to create images of your heart. It provides your doctor with information about the size and shape of your heart and how well your heart?s chambers and valves are working. This procedure takes approximately one hour. There are no restrictions for this procedure.  ? ?Follow-Up: ?At [facility], you and your health needs are our priority.  As part of our continuing mission to provide you with exceptional heart care, we have created designated Provider Care Teams.  These Care Teams include your primary Cardiologist (physician) and Advanced Practice Providers (APPs -  Physician Assistants and Nurse Practitioners) who all work together to provide you with the care you need, when you need it. ? ?We recommend signing up for the patient portal called "MyChart".  Sign up information is provided on this After Visit Summary.  MyChart is used to connect with patients for Virtual Visits (Telemedicine).  Patients are able to view lab/test results, encounter notes, upcoming appointments, etc.  Non-urgent messages can be sent to your provider as well.   ?To learn more about what you can do with MyChart, go to [email or web address].   ? ?Your next appointment:   ?4 month(s) ? ?The format for your next appointment:   ?In Person ? ?Provider:   ?Laurann Montana,  NP  ? ? ?Other Instructions ? ?Tips to Measure your Blood Pressure Correctly ? ?Here's what you can do to ensure a correct reading: ? Don't drink a caffeinated beverage or smoke during the 30 minutes before the test. ? Sit quietly for five minutes before the test begins. ? During the measurement, sit in a chair with your feet on the floor and your arm supported so your elbow is at about heart level. ? The inflatable part of the cuff should completely cover at least 80% of your upper arm, and the cuff should be placed on bare skin, not over a shirt. ? Don't talk during the measurement. ? ?Blood pressure categories  ?Blood pressure category SYSTOLIC ?(upper number)  DIASTOLIC ?(lower number)  ?Normal Less than 120 mm Hg and Less than 80 mm Hg  ?Elevated 120-129 mm Hg and Less than 80 mm Hg  ?High blood pressure: Stage 1 hypertension 130-139 mm Hg or 80-89 mm Hg  ?High blood pressure: Stage 2 hypertension 140 mm Hg or higher or 90 mm Hg or higher  ?Hypertensive crisis (consult your doctor immediately) Higher than 180 mm Hg and/or Higher than 120 mm Hg  ?Source: American Heart Association and American Stroke Association. ?For more on getting your blood pressure under control, buy Controlling Your Blood Pressure, a Special Health Report from [facility]. ? ? ?Blood Pressure Log ? ? ?Date ?  ?Time  ?Blood Pressure  ?Example: Nov 1 9 AM 124/78  ? ?    ? ?    ? ?    ? ?    ? ?    ? ?    ? ?    ? ?    ? ? ?  ? ?  Important Information About Sugar ? ? ? ? ?  ?

## 2021-08-12 ENCOUNTER — Encounter (HOSPITAL_BASED_OUTPATIENT_CLINIC_OR_DEPARTMENT_OTHER): Payer: Self-pay

## 2021-08-12 DIAGNOSIS — I10 Essential (primary) hypertension: Secondary | ICD-10-CM

## 2021-08-12 MED ORDER — FUROSEMIDE 20 MG PO TABS: 20.0000 mg | ORAL_TABLET | Freq: Every day | ORAL | 0 refills | Status: DC

## 2021-08-12 NOTE — Telephone Encounter (Signed)
Please advise for new meds ?

## 2021-08-12 NOTE — Telephone Encounter (Signed)
This would be an uncommon reaction to this medication. Okay to discontinue to see if symptoms resolve. If not, follow up with PCP.  ? ?Recommend referral to Advanced Hypertension Clinic as discussed in clinic visit. Her BP has been difficult to control despite multiple medication changes. We could trial her taking her Lasix daily instead of as needed with BMP in 2 weeks to see if this improves BP. ? ?Loel Dubonnet, NP  ?

## 2021-08-17 ENCOUNTER — Ambulatory Visit (INDEPENDENT_AMBULATORY_CARE_PROVIDER_SITE_OTHER): Payer: 59

## 2021-08-17 ENCOUNTER — Encounter (HOSPITAL_BASED_OUTPATIENT_CLINIC_OR_DEPARTMENT_OTHER): Payer: Self-pay

## 2021-08-17 DIAGNOSIS — I5020 Unspecified systolic (congestive) heart failure: Secondary | ICD-10-CM | POA: Diagnosis not present

## 2021-08-17 DIAGNOSIS — I10 Essential (primary) hypertension: Secondary | ICD-10-CM

## 2021-08-17 LAB — ECHOCARDIOGRAM COMPLETE
AR max vel: 2.31 cm2
AV Area VTI: 2.32 cm2
AV Area mean vel: 2.36 cm2
AV Mean grad: 2 mmHg
AV Peak grad: 4.2 mmHg
Ao pk vel: 1.03 m/s
Area-P 1/2: 4.57 cm2
Calc EF: 51.2 %
S' Lateral: 3.04 cm
Single Plane A2C EF: 53.5 %
Single Plane A4C EF: 48.3 %

## 2021-08-17 NOTE — Telephone Encounter (Signed)
BP log as requested 

## 2021-08-18 NOTE — Telephone Encounter (Signed)
As BP still elevated and having intolerance to medications - recommend referral to [facility] Hypertension Clinic.  ? ?Loel Dubonnet, NP  ?

## 2021-08-18 NOTE — Telephone Encounter (Signed)
Please advise 

## 2021-09-02 LAB — BASIC METABOLIC PANEL
BUN/Creatinine Ratio: 19 (ref 12–28)
BUN: 19 mg/dL (ref 8–27)
CO2: 26 mmol/L (ref 20–29)
Calcium: 9.5 mg/dL (ref 8.7–10.3)
Chloride: 104 mmol/L (ref 96–106)
Creatinine, Ser: 0.99 mg/dL (ref 0.57–1.00)
Glucose: 149 mg/dL — ABNORMAL HIGH (ref 70–99)
Potassium: 4.7 mmol/L (ref 3.5–5.2)
Sodium: 141 mmol/L (ref 134–144)
eGFR: 64 mL/min/{1.73_m2} (ref >59)

## 2021-09-14 NOTE — Telephone Encounter (Signed)
Hey this patient says she never heard from anyone in regards to getting scheduled for HTN clinic, can you help her please?

## 2021-09-25 ENCOUNTER — Other Ambulatory Visit (HOSPITAL_BASED_OUTPATIENT_CLINIC_OR_DEPARTMENT_OTHER): Payer: Self-pay | Admitting: Family

## 2021-09-25 DIAGNOSIS — I10 Essential (primary) hypertension: Secondary | ICD-10-CM

## 2021-09-26 NOTE — Telephone Encounter (Signed)
Rx request sent to pharmacy.  

## 2021-10-03 ENCOUNTER — Other Ambulatory Visit (HOSPITAL_BASED_OUTPATIENT_CLINIC_OR_DEPARTMENT_OTHER): Payer: Self-pay | Admitting: Cardiovascular Disease

## 2021-10-03 DIAGNOSIS — E785 Hyperlipidemia, unspecified: Secondary | ICD-10-CM

## 2021-10-03 DIAGNOSIS — I252 Old myocardial infarction: Secondary | ICD-10-CM

## 2021-10-03 NOTE — Progress Notes (Signed)
Advanced Hypertension Clinic Initial Assessment:    Date:  10/04/2021   ID:  Destiny Beasley, DOB 16-Dec-1955, MRN [id number]  PCP:  Lawerance Cruel, MD  Cardiologist:  Sherren Mocha, MD  Nephrologist:  Referring MD: Loel Dubonnet, NP   CC: Hypertension  History of Present Illness:    Destiny Beasley is a 66 y.o. female with a hx of hypertension, inferior STEMI, complete heart block, CAD, chronic systolic heart failure, stroke (03/2016), meningioma, pituitary tumor, right breast cancer s/p radiation therapy, hyperlipidemia, diabetes, anemia, asthma, and sleep apnea, here to establish care in the Advanced Hypertension Clinic. She has a history of inferior STEMI 04/930 complicated by cardiogenic shock/complete heart block. She had an emergent cath showing culprit large dominant LCx. Subsequently underwent aspiration thrombectomy and DES. Her LVEF was 40-45% and has remained unchanged. She has struggled with blood pressure control. Entresto was increased 04/2021, but reduced due to muscle pain. She saw Laurann Montana, NP 07/2021 and was started on doxazosin. She had a repeat Echo that revealed LVEF 45-50%, inferolateral hypokinesis, and grade 1 diastolic dysfunction.   Today, she reports having hypertension since her 29's, always difficult to control. Recently she has been needing to use her inhaler more often due to the air quality causing shortness of breath. Typically she does not exercise much, but is hoping to find someone to go to the gym with and restart her routines. Lately she has been limited after suffering multiple meniscus tears. Last week her physical therapy was completed. Previously she would stay at the gym for up to 2 hours using many machines in her routine. When she is able to exercise she then feels like she has energy all day. At home she typically cooks her meals and she is very conscientious of her salt intake. Previously she had "coffee all day long," but has now cut back  to 1 cup of coffee daily. She avoids sodas. For pain management she is taking Aleve once in the morning and at night since her knee surgery due to ongoing pain in her knees and her LE muscles. She notes that tylenol is typically not very effective for her. She stopped her doxazosin in 07/2021 due to multiple side effects. Unfortunately, her brother recently passed away so she has been under more stress. She has increased her effexor to 225 mg to help with her stress. 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 She endorses snoring, and is currently compliant with her BiPAP. She denies any palpitations, chest pain, or peripheral edema. No lightheadedness, headaches, syncope, orthopnea, or PND.  Previous medication intolerances: Amlodipine (muscle pain) Coreg (wheeze with '25mg'$  dose) Chlorthalidone (leg pain) Spironolactone (wheeze/fatigue) Jardiance (hallucinations, yeast infections) Entresto (intolerant to 97-'103mg'$  dose with cramps, tolerates 49-'51mg'$  dose) Hydralazine (fatigue) Imdur (wheeze) Doxazosin (leg cramps)  Past Medical History:  Diagnosis Date   Anemia    Anxiety    CAD in native artery    a. Inf STEMI 06/5571 complicated by cardiogenic shock/complete heart block s/p emergent cath showing culprit large dominant LCx s/p aspiration thrombectomy and DES, EF 50% by cath, 40-45% by echo.   Chronic systolic heart failure ([place]) 06/25/2017   Echo 11/19: mild LVH, EF 40-45, inf-lat HK, Gr 1 DD, trivial MR   Complete heart block, transient ([place])    a. 06/2017 in setting of acute inf MI -> resolved after PCI.   COVID-19    Diabetes mellitus  Goiter    History of radiation therapy 06/14/2016 - 07/19/2016   Right Breast 50 Gy 25 fractions   Hypercholesteremia    Hypertension    Ischemic cardiomyopathy    a. EF 50% by cath and 40-45% by echo 06/2017.   Leg pain    ABIs 07/2019: Normal (R 1.27; L 1.25)   Malignant neoplasm of upper-outer quadrant of  right female breast ([place]) 02/24/2016   Meningioma [facility])    Nuclear stress test    Nuclear stress test 10/19: EF 35, inferolateral, apical scar; inferior, apical inferior infarct with mild peri-infarct ischemia; high risk   Obesity    Personal history of chemotherapy    Personal history of radiation therapy    Pituitary tumor    Seasonal asthma    Sleep apnea    does not use every night   Stroke [facility])    TIA - Dec 1 st, 2017    Past Surgical History:  Procedure Laterality Date   ABDOMINAL HYSTERECTOMY     BRAIN MENINGIOMA EXCISION  2005   BREAST BIOPSY     BREAST LUMPECTOMY Right    2017   BREAST LUMPECTOMY WITH RADIOACTIVE SEED LOCALIZATION Right 04/20/2016   Procedure: RIGHT BREAST LUMPECTOMY WITH RADIOACTIVE SEED LOCALIZATION;  Surgeon: Fanny Skates, MD;  Location: [place];  Service: General;  Laterality: Right;   BREAST REDUCTION SURGERY Bilateral 10/23/2016   Procedure: BILATERAL BREAST REDUCTION WITH LIPOSUCTION ASSISTANCE;  Surgeon: Cristine Polio, MD;  Location: [place];  Service: Plastics;  Laterality: Bilateral;   CESAREAN SECTION     CORONARY STENT INTERVENTION N/A 06/25/2017   Procedure: CORONARY STENT INTERVENTION;  Surgeon: Sherren Mocha, MD;  Location: [place] CV LAB;  Service: Cardiovascular;  Laterality: N/A;   CORONARY/GRAFT ACUTE MI REVASCULARIZATION N/A 06/25/2017   Procedure: Coronary/Graft Acute MI Revascularization;  Surgeon: Sherren Mocha, MD;  Location: [place] CV LAB;  Service: Cardiovascular;  Laterality: N/A;   FOOT SURGERY Bilateral 2016   hammer toe surgery   LEFT HEART CATH AND CORONARY ANGIOGRAPHY N/A 06/25/2017   Procedure: LEFT HEART CATH AND CORONARY ANGIOGRAPHY;  Surgeon: Sherren Mocha, MD;  Location: [place] CV LAB;  Service: Cardiovascular;  Laterality: N/A;   LEFT HEART CATH AND CORONARY ANGIOGRAPHY N/A 05/13/2020   Procedure: LEFT HEART CATH AND CORONARY ANGIOGRAPHY;  Surgeon: Lorretta Harp, MD;  Location: [place] CV LAB;  Service: Cardiovascular;  Laterality: N/A;   MENISCUS REPAIR Left 2015   PITUITARY SURGERY     REDUCTION MAMMAPLASTY     RIGHT HEART CATH N/A 06/25/2017   Procedure: RIGHT HEART CATH;  Surgeon: Sherren Mocha, MD;  Location: [place] CV LAB;  Service: Cardiovascular;  Laterality: N/A;   THYROID SURGERY      Current Medications: Current Meds  Medication Sig   ADVAIR HFA 115-21 MCG/ACT inhaler as needed.   albuterol (VENTOLIN HFA) 108 (90 Base) MCG/ACT inhaler SMARTSIG:1-2 Puff(s) By Mouth Every 4 Hours PRN   Alirocumab (PRALUENT) 75 MG/ML SOAJ INJECT 1 ML INTO THE SKIN EVERY 14 DAYS   ALPRAZolam (XANAX) 0.25 MG tablet Take 0.25 mg by mouth as needed.   aspirin EC 81 MG tablet Take 1 tablet (81 mg total) by mouth daily.   bisoprolol (ZEBETA) 5 MG tablet Take 1/2 tablet by mouth daily   Coenzyme Q10 100 MG capsule Take 100 mg by mouth daily.   Continuous Blood Gluc Sensor (FREESTYLE LIBRE 3 SENSOR) MISC 1 Device by Does not apply route  every 14 (fourteen) days. Apply 1 sensor on upper arm every 14 days for continuous glucose monitoring   fluconazole (DIFLUCAN) 150 MG tablet Take 150 mg by mouth as needed.   furosemide (LASIX) 20 MG tablet Take 1 tablet (20 mg total) by mouth daily.   glucose blood (FREESTYLE TEST STRIPS) test strip Use as instructed   insulin lispro (HUMALOG) 100 UNIT/ML cartridge INJECT SUBCUTANEOUSLY 3 TO 25  UNITS 3 TIMES DAILY WITH MEALS . USE PER SLIDING SCALE   LANTUS SOLOSTAR 100 UNIT/ML Solostar Pen INJECT SUBCUTANEOUSLY 70  UNITS DAILY   MOUNJARO 10 MG/0.5ML Pen Inject 10 mg into the skin once a week.   nitroGLYCERIN (NITROSTAT) 0.4 MG SL tablet DISSOLVE ONE TABLET UNDER TONGUE AS NEEDED FOR CHEST PAIN   ondansetron (ZOFRAN-ODT) 4 MG disintegrating tablet ondansetron 4 mg disintegrating tablet  TAKE 1 TABLET BY MOUTH EVERY 4 HOURS AS NEEDED FOR NAUSEA   sacubitril-valsartan (ENTRESTO) 49-51 MG Take 1 tablet by mouth 2 (two)  times daily.   valACYclovir (VALTREX) 1000 MG tablet Take 1,000 mg by mouth 2 (two) times daily as needed (infection).   venlafaxine (EFFEXOR) 75 MG tablet Take 225 mg by mouth daily at 12 noon.   [DISCONTINUED] doxazosin (CARDURA) 2 MG tablet TAKE 1 TABLET(2 MG) BY MOUTH DAILY     Allergies:   Hydrocodone, Invokana [canagliflozin], Amlodipine, Carvedilol, Chlorthalidone, Doxazosin, Evolocumab, Hydralazine, Imdur [isosorbide nitrate], Jardiance [empagliflozin], Rosuvastatin, Simvastatin, Spironolactone, Atorvastatin, and Compazine [prochlorperazine]   Social History   Socioeconomic History   Marital status: Single    Spouse name: Not on file   Number of children: Not on file   Years of education: Not on file   Highest education level: Bachelor's degree (e.g., BA, AB, BS)  Occupational History   Occupation: full time  Tobacco Use   Smoking status: Never   Smokeless tobacco: Never  Vaping Use   Vaping Use: Never used  Substance and Sexual Activity   Alcohol use: Yes    Alcohol/week: 1.0 standard drink of alcohol    Types: 1 Glasses of wine per week    Comment: 1 glass monthly   Drug use: No   Sexual activity: Not on file    Comment: no cycles; pt had hyst  Other Topics Concern   Not on file  Social History Narrative   Lives alone   Right Handed   Drinks 1-2 cups caffeine daily   Social Determinants of Health   Financial Resource Strain: Low Risk  (10/04/2021)   Overall Financial Resource Strain (CARDIA)    Difficulty of Paying Living Expenses: Not hard at all  Food Insecurity: No Food Insecurity (10/04/2021)   Hunger Vital Sign    Worried About Running Out of Food in the Last Year: Never true    Ran Out of Food in the Last Year: Never true  Transportation Needs: No Transportation Needs (10/04/2021)   PRAPARE - Hydrologist (Medical): No    Lack of Transportation (Non-Medical): No  Physical Activity: Inactive (10/04/2021)   Exercise Vital Sign     Days of Exercise per Week: 0 days    Minutes of Exercise per Session: 0 min  Stress: Not on file  Social Connections: Not on file     Family History: The patient's family history includes Alcohol abuse in her brother; Diabetes in her mother; Heart attack in her mother; Hyperlipidemia in her mother; Hypertension in her mother and sister; Stroke in her maternal uncle.  ROS:  Please see the history of present illness.    (+) Shortness of breath (+) Bilateral knee pain (+) Bilateral LE myalgias (+) Stress (+) Snoring All other systems reviewed and are negative.  EKGs/Labs/Other Studies Reviewed:    Echo 08/17/2021: Sonographer Comments: Suboptimal subcostal window, suboptimal apical window and patient is morbidly obese. Image acquisition challenging due to patient body habitus.  IMPRESSIONS    1. Hypokinesis of the inferolateral wall with overall mild LV  dysfunction.   2. Left ventricular ejection fraction, by estimation, is 45 to 50%. The  left ventricle has mildly decreased function. The left ventricle  demonstrates regional wall motion abnormalities (see scoring  diagram/findings for description). Left ventricular  diastolic parameters are consistent with Grade I diastolic dysfunction  (impaired relaxation).   3. Right ventricular systolic function is normal. The right ventricular  size is normal.   4. Left atrial size was mildly dilated.   5. The mitral valve is normal in structure. Trivial mitral valve  regurgitation. No evidence of mitral stenosis.   6. The aortic valve is tricuspid. Aortic valve regurgitation is not  visualized. Aortic valve sclerosis is present, with no evidence of aortic  valve stenosis.   Comparison(s): EF 40%,mild hypokinesis of the left ventricular, mid-apical  inferior wall and inferolateral wall, trivial AI.  CTA Chest/Abdomen/Pelvis  06/08/2021: FINDINGS: CTA CHEST FINDINGS   Cardiovascular: Preferential opacification of the thoracic  aorta. No evidence of thoracic aortic aneurysm or dissection. Normal heart size. No pericardial effusion.   Mediastinum/Nodes: Multiple left thyroid nodules are noted, the largest measuring 2 cm. Status post right thyroidectomy. No adenopathy is noted. The esophagus is unremarkable.   Lungs/Pleura: Lungs are clear. No pleural effusion or pneumothorax.   Musculoskeletal: No chest wall abnormality. No acute or significant osseous findings.   Review of the MIP images confirms the above findings.   CTA ABDOMEN AND PELVIS FINDINGS   VASCULAR   Aorta: Atherosclerosis of abdominal aorta is noted without aneurysm or dissection.   Celiac: Patent without evidence of aneurysm, dissection, vasculitis or significant stenosis.   SMA: Patent without evidence of aneurysm, dissection, vasculitis or significant stenosis.   Renals: Both renal arteries are patent without evidence of aneurysm, dissection, vasculitis, fibromuscular dysplasia or significant stenosis.   IMA: Patent without evidence of aneurysm, dissection, vasculitis or significant stenosis.   Inflow: Patent without evidence of aneurysm, dissection, vasculitis or significant stenosis.   Veins: No obvious venous abnormality within the limitations of this arterial phase study.   Review of the MIP images confirms the above findings.   NON-VASCULAR   Hepatobiliary: No focal liver abnormality is seen. No gallstones, gallbladder wall thickening, or biliary dilatation.   Pancreas: Unremarkable. No pancreatic ductal dilatation or surrounding inflammatory changes.   Spleen: Normal in size without focal abnormality.   Adrenals/Urinary Tract: Adrenal glands are unremarkable. Kidneys are normal, without renal calculi, focal lesion, or hydronephrosis. Bladder is unremarkable.   Stomach/Bowel: Stomach is within normal limits. Appendix appears normal. No evidence of bowel wall thickening, distention, or inflammatory changes.  Diverticulosis of sigmoid colon is noted without inflammation.   Lymphatic: No adenopathy is noted.   Reproductive: Status post hysterectomy. No adnexal masses.   Other: No abdominal wall hernia or abnormality. No abdominopelvic ascites.   Musculoskeletal: No acute or significant osseous findings.   Review of the MIP images confirms the above findings.   IMPRESSION: No evidence of thoracic or abdominal aortic dissection or aneurysm.   Status post right thyroidectomy. Multiple left thyroid nodules  are noted, the largest measuring 2 cm. Recommend thyroid US. (Ref: J Am Coll Radiol. 2015 Feb;12(2): 143-50).   Sigmoid diverticulosis without inflammation.   Aortic Atherosclerosis (ICD10-I70.0).  Left heart catheterization  05/13/2020: Previously placed Mid Cx to Dist Cx stent (unknown type) is widely patent. Prox LAD lesion is 40% stenosed. There is mild to moderate left ventricular systolic dysfunction. LV end diastolic pressure is normal. The left ventricular ejection fraction is 45-50% by visual estimate.  IMPRESSION: Ms. Martha had a dominant circumflex stent was widely patent.  She has mild proximal LAD disease.  She has mild LV dysfunction with an EF of approximate 45 to 50% with inferoapical hypokinesia.  There were no "culprit lesions" identifiable that would contribute to chest pain.  Recommend continued medical therapy.  The sheath was removed and a TR band was placed on the right wrist to achieve patent hemostasis.  The patient left the lab in stable condition.  Diagnostic: Dominance: Left   Exercise Stress Test  02/12/2018: Nuclear stress EF: 35%. The left ventricular ejection fraction is moderately decreased (30-44%). Blood pressure demonstrated a normal response to exercise. There was no ST segment deviation noted during stress. There is a medium defect of severe severity present in the basal inferolateral, mid inferolateral and apical lateral location. The defect  is non-reversible and consistent with scar. There is a medium defect of moderate severity present in the basal inferior, mid inferior and apical inferior location. The defect is partially reversible and consistent with infarct with mild peri infarct ischemia. This is a high risk study.   EKG:   EKG is personally reviewed. 10/04/2021: EKG was not ordered.   Recent Labs: 01/18/2021: TSH 1.26 06/08/2021: ALT 22; B Natriuretic Peptide 46.6; Hemoglobin 15.1; Magnesium 1.8; Platelets 279 09/01/2021: BUN 19; Creatinine, Ser 0.99; Potassium 4.7; Sodium 141   Recent Lipid Panel    Component Value Date/Time   CHOL 120 10/06/2020 0743   TRIG 63 10/06/2020 0743   HDL 54 10/06/2020 0743   CHOLHDL 2.2 10/06/2020 0743   CHOLHDL 2 08/12/2018 0944   VLDL 19.2 08/12/2018 0944   LDLCALC 52 10/06/2020 0743   LDLDIRECT 67 07/15/2020 1609   LDLDIRECT 87.0 06/04/2019 0835    Physical Exam:    VS:  BP (!) 168/94 (BP Location: Right Arm, Patient Position: Sitting, Cuff Size: Large)   Pulse 90   Ht 6' (1.829 m)   Wt 260 lb 3.2 oz (118 kg)   SpO2 100%   BMI 35.29 kg/m  , BMI Body mass index is 35.29 kg/m. GENERAL:  Well appearing HEENT: Pupils equal round and reactive, fundi not visualized, oral mucosa unremarkable NECK:  No jugular venous distention, waveform within normal limits, carotid upstroke brisk and symmetric, no bruits, no thyromegaly LUNGS:  Clear to auscultation bilaterally HEART:  RRR.  PMI not displaced or sustained,S1 and S2 within normal limits, no S3, no S4, no clicks, no rubs, no murmurs ABD:  Flat, positive bowel sounds normal in frequency in pitch, no bruits, no rebound, no guarding, no midline pulsatile mass, no hepatomegaly, no splenomegaly EXT:  2 plus pulses throughout, no edema, no cyanosis no clubbing SKIN:  No rashes no nodules NEURO:  Cranial nerves II through XII grossly intact, motor grossly intact throughout PSYCH:  Cognitively intact, oriented to person place and  time   ASSESSMENT/PLAN:    Essential hypertension Blood pressure is very poorly controlled.  She struggles with intolerance to multiple medications.  She has not tolerated higher doses of  Entresto.  We will try switching the metoprolol to bisoprolol 2.5 mg daily.  She was given an advance hypertension clinic booklet and consents to enrolling in the remote patient monitoring study and use at the [place] remote patient monitoring system.  She will work on increasing her exercise to at least 150 minutes weekly and will participate in the PREP program at the [facility].  Screening for Secondary Hypertension:      10/04/2021    9:43 AM  Causes  Drugs/Herbals Screened     - Comments limitis sodium and caffeine.  Uses Aleve bid.  Renovascular HTN Screened     - Comments No renal artery stenosis on CT 05/2021  Sleep Apnea Screened     - Comments uses BiPAP  Thyroid Disease Screened  Hyperaldosteronism Screened     - Comments No adenomas on CT in 2023  Pheochromocytoma Screened     - Comments No adenomas or symptoms.  Cushing's Syndrome N/A  Hyperparathyroidism Screened  Coarctation of the Aorta Screened     - Comments BP symmetric  Compliance Screened    Relevant Labs/Studies:    Latest Ref Rng & Units 09/01/2021    9:20 AM 06/08/2021    9:30 AM 03/15/2021    8:18 AM  Basic Labs  Sodium 134 - 144 mmol/L 141  136  143   Potassium 3.5 - 5.2 mmol/L 4.7  3.9  4.4   Creatinine 0.57 - 1.00 mg/dL 0.99  0.88  1.01        Latest Ref Rng & Units 01/18/2021    8:46 AM 11/30/2020    8:22 AM  Thyroid   TSH 0.35 - 5.50 uIU/mL 1.26  3.57              Latest Ref Rng & Units 11/11/2011    1:14 PM  Cortisol  Cortisol  ug/dL 9.6          CAD (coronary artery disease) Lipids have been well controlled.  Continue aspirin and Praluent.  Working on blood pressure control as above.  OSA on CPAP Continue BiPAP  Hyperlipidemia LDL goal <70 Lipids controlled on Praluent   Screening for Secondary  Hypertension:     10/04/2021    9:43 AM  Causes  Drugs/Herbals Screened     - Comments limitis sodium and caffeine.  Uses Aleve bid.  Renovascular HTN Screened     - Comments No renal artery stenosis on CT 05/2021  Sleep Apnea Screened     - Comments uses BiPAP  Thyroid Disease Screened  Hyperaldosteronism Screened     - Comments No adenomas on CT in 2023  Pheochromocytoma Screened     - Comments No adenomas or symptoms.  Cushing's Syndrome N/A  Hyperparathyroidism Screened  Coarctation of the Aorta Screened     - Comments BP symmetric  Compliance Screened    Relevant Labs/Studies:    Latest Ref Rng & Units 09/01/2021    9:20 AM 06/08/2021    9:30 AM 03/15/2021    8:18 AM  Basic Labs  Sodium 134 - 144 mmol/L 141  136  143   Potassium 3.5 - 5.2 mmol/L 4.7  3.9  4.4   Creatinine 0.57 - 1.00 mg/dL 0.99  0.88  1.01        Latest Ref Rng & Units 01/18/2021    8:46 AM 11/30/2020    8:22 AM  Thyroid   TSH 0.35 - 5.50 uIU/mL 1.26  3.57  Latest Ref Rng & Units 11/11/2011    1:14 PM  Cortisol  Cortisol  ug/dL 9.6      Disposition:    FU with APP/PharmD in 1 month for the next 3 months.   FU with Kanden Carey C. Oval Linsey, MD, [facility] in 4 months.   Medication Adjustments/Labs and Tests Ordered: Current medicines are reviewed at length with the patient today.  Concerns regarding medicines are outlined above.   Orders Placed This Encounter  Procedures   Amb Referral To Provider Referral Exercise Program (P.R.E.P)   Meds ordered this encounter  Medications   bisoprolol (ZEBETA) 5 MG tablet    Sig: Take 1/2 tablet by mouth daily    Dispense:  45 tablet    Refill:  0    STOP METOPROLOL   I,Mathew Stumpf,acting as a scribe for Skeet Latch, MD.,have documented all relevant documentation on the behalf of Skeet Latch, MD,as directed by  Skeet Latch, MD while in the presence of Skeet Latch, MD.  I, [place] Oval Linsey, MD have reviewed all  documentation for this visit.  The documentation of the exam, diagnosis, procedures, and orders on 10/04/2021 are all accurate and complete.  Time spent: 45 minutes-Greater than 50% of this time was spent in counseling, explanation of diagnosis, planning of further management, and coordination of care.   Signed, Skeet Latch, MD  10/04/2021 10:04 AM    [place]

## 2021-10-04 ENCOUNTER — Ambulatory Visit: Payer: 59

## 2021-10-04 ENCOUNTER — Encounter (HOSPITAL_BASED_OUTPATIENT_CLINIC_OR_DEPARTMENT_OTHER): Payer: Self-pay | Admitting: Cardiovascular Disease

## 2021-10-04 ENCOUNTER — Ambulatory Visit (INDEPENDENT_AMBULATORY_CARE_PROVIDER_SITE_OTHER): Payer: 59 | Admitting: Cardiovascular Disease

## 2021-10-04 DIAGNOSIS — E785 Hyperlipidemia, unspecified: Secondary | ICD-10-CM | POA: Diagnosis not present

## 2021-10-04 DIAGNOSIS — Z006 Encounter for examination for normal comparison and control in clinical research program: Secondary | ICD-10-CM

## 2021-10-04 DIAGNOSIS — I10 Essential (primary) hypertension: Secondary | ICD-10-CM

## 2021-10-04 DIAGNOSIS — I25110 Atherosclerotic heart disease of native coronary artery with unstable angina pectoris: Secondary | ICD-10-CM

## 2021-10-04 DIAGNOSIS — Z9989 Dependence on other enabling machines and devices: Secondary | ICD-10-CM

## 2021-10-04 DIAGNOSIS — G4733 Obstructive sleep apnea (adult) (pediatric): Secondary | ICD-10-CM | POA: Diagnosis not present

## 2021-10-04 MED ORDER — BISOPROLOL FUMARATE 5 MG PO TABS: ORAL_TABLET | ORAL | 0 refills | Status: DC

## 2021-10-04 NOTE — Addendum Note (Signed)
Addended by: Skeet Latch C on: 10/04/2021 12:08 PM   Modules accepted: Orders

## 2021-10-04 NOTE — Patient Instructions (Signed)
Medication Instructions:  STOP METOPROLOL   START BISOPROLOL 5 MG 1/2 TABLET DAILY    Labwork: NONE    Testing/Procedures: NONE   Follow-Up: 11/03/2021 9:00 AM WITH PHARM D AT [place] will receive a phone call from the PREP exercise and nutrition program to schedule an initial assessment.  Special Instructions:   MONITOR YOUR BLOOD PRESSURE TWICE A DAY WITH MACHINE PROVIDED. LOG IN BOOK AND BRING TO FOLLOW UP 1 MONTH  DASH Eating Plan DASH stands for "Dietary Approaches to Stop Hypertension." The DASH eating plan is a healthy eating plan that has been shown to reduce high blood pressure (hypertension). It may also reduce your risk for type 2 diabetes, heart disease, and stroke. The DASH eating plan may also help with weight loss. What are tips for following this plan?  General guidelines Avoid eating more than 2,300 mg (milligrams) of salt (sodium) a day. If you have hypertension, you may need to reduce your sodium intake to 1,500 mg a day. Limit alcohol intake to no more than 1 drink a day for nonpregnant women and 2 drinks a day for men. One drink equals 12 oz of beer, 5 oz of wine, or 1 oz of hard liquor. Work with your health care provider to maintain a healthy body weight or to lose weight. Ask what an ideal weight is for you. Get at least 30 minutes of exercise that causes your heart to beat faster (aerobic exercise) most days of the week. Activities may include walking, swimming, or biking. Work with your health care provider or diet and nutrition specialist (dietitian) to adjust your eating plan to your individual calorie needs. Reading food labels  Check food labels for the amount of sodium per serving. Choose foods with less than 5 percent of the Daily Value of sodium. Generally, foods with less than 300 mg of sodium per serving fit into this eating plan. To find whole grains, look for the word "whole" as the first word in the ingredient  list. Shopping Buy products labeled as "low-sodium" or "no salt added." Buy fresh foods. Avoid canned foods and premade or frozen meals. Cooking Avoid adding salt when cooking. Use salt-free seasonings or herbs instead of table salt or sea salt. Check with your health care provider or pharmacist before using salt substitutes. Do not fry foods. Cook foods using healthy methods such as baking, boiling, grilling, and broiling instead. Cook with heart-healthy oils, such as olive, canola, soybean, or sunflower oil. Meal planning Eat a balanced diet that includes: 5 or more servings of fruits and vegetables each day. At each meal, try to fill half of your plate with fruits and vegetables. Up to 6-8 servings of whole grains each day. Less than 6 oz of lean meat, poultry, or fish each day. A 3-oz serving of meat is about the same size as a deck of cards. One egg equals 1 oz. 2 servings of low-fat dairy each day. A serving of nuts, seeds, or beans 5 times each week. Heart-healthy fats. Healthy fats called Omega-3 fatty acids are found in foods such as flaxseeds and coldwater fish, like sardines, salmon, and mackerel. Limit how much you eat of the following: Canned or prepackaged foods. Food that is high in trans fat, such as fried foods. Food that is high in saturated fat, such as fatty meat. Sweets, desserts, sugary drinks, and other foods with added sugar. Full-fat dairy products. Do not salt foods before eating. Try to eat at least  2 vegetarian meals each week. Eat more home-cooked food and less restaurant, buffet, and fast food. When eating at a restaurant, ask that your food be prepared with less salt or no salt, if possible. What foods are recommended? The items listed may not be a complete list. Talk with your dietitian about what dietary choices are best for you. Grains Whole-grain or whole-wheat bread. Whole-grain or whole-wheat pasta. Brown rice. [place]. Bulgur. Whole-grain and  low-sodium cereals. Pita bread. Low-fat, low-sodium crackers. Whole-wheat flour tortillas. Vegetables Fresh or frozen vegetables (raw, steamed, roasted, or grilled). Low-sodium or reduced-sodium tomato and vegetable juice. Low-sodium or reduced-sodium tomato sauce and tomato paste. Low-sodium or reduced-sodium canned vegetables. Fruits All fresh, dried, or frozen fruit. Canned fruit in natural juice (without added sugar). Meat and other protein foods Skinless chicken or [place]. Ground chicken or [place]. Pork with fat trimmed off. Fish and seafood. Egg whites. Dried beans, peas, or lentils. Unsalted nuts, nut butters, and seeds. Unsalted canned beans. Lean cuts of beef with fat trimmed off. Low-sodium, lean deli meat. Dairy Low-fat (1%) or fat-free (skim) milk. Fat-free, low-fat, or reduced-fat cheeses. Nonfat, low-sodium ricotta or cottage cheese. Low-fat or nonfat yogurt. Low-fat, low-sodium cheese. Fats and oils Soft margarine without trans fats. Vegetable oil. Low-fat, reduced-fat, or light mayonnaise and salad dressings (reduced-sodium). Canola, safflower, olive, soybean, and sunflower oils. Avocado. Seasoning and other foods Herbs. Spices. Seasoning mixes without salt. Unsalted popcorn and pretzels. Fat-free sweets. What foods are not recommended? The items listed may not be a complete list. Talk with your dietitian about what dietary choices are best for you. Grains Baked goods made with fat, such as croissants, muffins, or some breads. Dry pasta or rice meal packs. Vegetables Creamed or fried vegetables. Vegetables in a cheese sauce. Regular canned vegetables (not low-sodium or reduced-sodium). Regular canned tomato sauce and paste (not low-sodium or reduced-sodium). Regular tomato and vegetable juice (not low-sodium or reduced-sodium). Angie Fava. Olives. Fruits Canned fruit in a light or heavy syrup. Fried fruit. Fruit in cream or butter sauce. Meat and other protein foods Fatty cuts of  meat. Ribs. Fried meat. Berniece Salines. Sausage. Bologna and other processed lunch meats. Salami. Fatback. Hotdogs. Bratwurst. Salted nuts and seeds. Canned beans with added salt. Canned or smoked fish. Whole eggs or egg yolks. Chicken or [place] with skin. Dairy Whole or 2% milk, cream, and half-and-half. Whole or full-fat cream cheese. Whole-fat or sweetened yogurt. Full-fat cheese. Nondairy creamers. Whipped toppings. Processed cheese and cheese spreads. Fats and oils Butter. Stick margarine. Lard. Shortening. Ghee. Bacon fat. Tropical oils, such as coconut, palm kernel, or palm oil. Seasoning and other foods Salted popcorn and pretzels. Onion salt, garlic salt, seasoned salt, table salt, and sea salt. Worcestershire sauce. Tartar sauce. Barbecue sauce. Teriyaki sauce. Soy sauce, including reduced-sodium. Steak sauce. Canned and packaged gravies. Fish sauce. Oyster sauce. Cocktail sauce. Horseradish that you find on the shelf. Ketchup. Mustard. Meat flavorings and tenderizers. Bouillon cubes. Hot sauce and Tabasco sauce. Premade or packaged marinades. Premade or packaged taco seasonings. Relishes. Regular salad dressings. Where to find more information: National Heart, Lung, and [place]: https://wilson-eaton.com/ American Heart Association: www.heart.org Summary The DASH eating plan is a healthy eating plan that has been shown to reduce high blood pressure (hypertension). It may also reduce your risk for type 2 diabetes, heart disease, and stroke. With the DASH eating plan, you should limit salt (sodium) intake to 2,300 mg a day. If you have hypertension, you may need to reduce your sodium intake  to 1,500 mg a day. When on the DASH eating plan, aim to eat more fresh fruits and vegetables, whole grains, lean proteins, low-fat dairy, and heart-healthy fats. Work with your health care provider or diet and nutrition specialist (dietitian) to adjust your eating plan to your individual calorie needs. This  information is not intended to replace advice given to you by your health care provider. Make sure you discuss any questions you have with your health care provider. Document Released: 03/30/2011 Document Revised: 03/23/2017 Document Reviewed: 04/03/2016 Elsevier Patient Education  2020 [place]. 11/04/10

## 2021-10-04 NOTE — Research (Signed)
  Subject Name: Destiny Beasley met inclusion and exclusion criteria for the Virtual Care and Social Determinant Interventions for the management of hypertension trial.  The informed consent form, study requirements and expectations were reviewed with the subject by Dr. Oval Linsey and myself. The subject was given the opportunity to read the consent and ask questions. The subject verbalized understanding of the trial requirements.  All questions were addressed prior to the signing of the consent form. The subject agreed to participate in the trial and signed the informed consent. The informed consent was obtained prior to performance of any protocol-specific procedures for the subject.  A copy of the signed informed consent was given to the subject and a copy was placed in the subject's medical record.  Destiny Beasley was randomized to Group 1.

## 2021-10-04 NOTE — Assessment & Plan Note (Signed)
Continue BiPAP.  

## 2021-10-04 NOTE — Assessment & Plan Note (Signed)
Lipids controlled on Praluent

## 2021-10-04 NOTE — Assessment & Plan Note (Addendum)
Blood pressure is very poorly controlled.  She struggles with intolerance to multiple medications.  She has not tolerated higher doses of Entresto.  We will try switching the metoprolol to bisoprolol 2.5 mg daily.  She was given an advance hypertension clinic booklet and consents to enrolling in the remote patient monitoring study and use at the [facility] remote patient monitoring system.  She will work on increasing her exercise to at least 150 minutes weekly and will participate in the PREP program at the [facility].  Screening for Secondary Hypertension:      10/04/2021    9:43 AM  Causes  Drugs/Herbals Screened     - Comments limitis sodium and caffeine.  Uses Aleve bid.  Renovascular HTN Screened     - Comments No renal artery stenosis on CT 05/2021  Sleep Apnea Screened     - Comments uses BiPAP  Thyroid Disease Screened  Hyperaldosteronism Screened     - Comments No adenomas on CT in 2023  Pheochromocytoma Screened     - Comments No adenomas or symptoms.  Cushing's Syndrome N/A  Hyperparathyroidism Screened  Coarctation of the Aorta Screened     - Comments BP symmetric  Compliance Screened    Relevant Labs/Studies:    Latest Ref Rng & Units 09/01/2021    9:20 AM 06/08/2021    9:30 AM 03/15/2021    8:18 AM  Basic Labs  Sodium 134 - 144 mmol/L 141  136  143   Potassium 3.5 - 5.2 mmol/L 4.7  3.9  4.4   Creatinine 0.57 - 1.00 mg/dL 0.99  0.88  1.01        Latest Ref Rng & Units 01/18/2021    8:46 AM 11/30/2020    8:22 AM  Thyroid   TSH 0.35 - 5.50 uIU/mL 1.26  3.57              Latest Ref Rng & Units 11/11/2011    1:14 PM  Cortisol  Cortisol  ug/dL 9.6

## 2021-10-04 NOTE — Assessment & Plan Note (Signed)
Lipids have been well controlled.  Continue aspirin and Praluent.  Working on blood pressure control as above.

## 2021-10-05 ENCOUNTER — Other Ambulatory Visit: Payer: Self-pay | Admitting: Endocrinology

## 2021-10-06 ENCOUNTER — Telehealth: Payer: Self-pay

## 2021-10-06 ENCOUNTER — Other Ambulatory Visit: Payer: Self-pay | Admitting: Endocrinology

## 2021-10-06 NOTE — Telephone Encounter (Signed)
Called to discuss PREP program, she wants to attend, prefers [facility]; will have [facility] contact her for July scheduled classes.

## 2021-10-10 ENCOUNTER — Other Ambulatory Visit: Payer: Self-pay | Admitting: Endocrinology

## 2021-10-17 ENCOUNTER — Telehealth: Payer: Self-pay

## 2021-11-03 ENCOUNTER — Encounter: Payer: Self-pay | Admitting: Pharmacist

## 2021-11-03 ENCOUNTER — Ambulatory Visit (INDEPENDENT_AMBULATORY_CARE_PROVIDER_SITE_OTHER): Payer: 59 | Admitting: Pharmacist

## 2021-11-03 VITALS — BP 147/86 | HR 91 | Wt 254.4 lb

## 2021-11-03 DIAGNOSIS — I10 Essential (primary) hypertension: Secondary | ICD-10-CM | POA: Diagnosis not present

## 2021-11-03 DIAGNOSIS — I5022 Chronic systolic (congestive) heart failure: Secondary | ICD-10-CM | POA: Diagnosis not present

## 2021-11-03 MED ORDER — HYDRALAZINE HCL 25 MG PO TABS: 25.0000 mg | ORAL_TABLET | Freq: Two times a day (BID) | ORAL | 1 refills | Status: DC

## 2021-11-03 NOTE — Progress Notes (Signed)
Patient ID: Destiny Beasley                 DOB: 19-Dec-1955                      MRN: [id number]     HPI: Destiny Beasley is a 66 y.o. female referred by Dr. Oval Linsey to HTN clinic. PMH is significant for CHF, CAD, T2DM, TIA and mmultiple medication intolerances. Dr Oval Linsey started patient on bisoprolol 2.'5mg'$  at last visit.  Patint presents today before work. Self discontinued bisoprolol 2 weeks ago because of muscle pain and restarted metoprolol. So far has had no adverse reactions to metoprolol '25mg'$ .  Takes Entresto 49-'51mg'$  but has 97-'103mg'$  at home also. Will occasionally take higher strength Entresto in the evening and if she feels well the next morning will repeat with another 97-103.    Does not add salt to food and is working on eating healthier. Had eggs for breakfast and will have tuna for lunch today.  Walks for a mile every morning with her daughter on a treadmill. Estimates it takes her about 30 minutes.  Takes morning medications between 6:30-7. Exercises at 7.  Takes BP at 8.  Home BP readings: 7/13: 162/93 7/12: 166/83 7/11: 155/98, 166/87 7/10: 145/92 7/9: 156/93, 155/83  Current HTN meds:  Metoprolol 25 daily Furosemide '20mg'$  daily Entresto 49-'51mg'$  BID  Previously tried: Numerous intolerances  BP goal: <130/80  Wt Readings from Last 3 Encounters:  10/04/21 260 lb 3.2 oz (118 kg)  08/03/21 266 lb 9.6 oz (120.9 kg)  06/08/21 260 lb (117.9 kg)   BP Readings from Last 3 Encounters:  10/04/21 (!) 168/94  08/03/21 (!) 160/78  06/08/21 140/79   Pulse Readings from Last 3 Encounters:  10/04/21 90  08/03/21 81  06/08/21 83    Renal function: CrCl cannot be calculated (Patient's most recent lab result is older than the maximum 21 days allowed.).  Past Medical History:  Diagnosis Date   Anemia    Anxiety    CAD in native artery    a. Inf STEMI 11/6576 complicated by cardiogenic shock/complete heart block s/p emergent cath showing culprit large dominant LCx s/p  aspiration thrombectomy and DES, EF 50% by cath, 40-45% by echo.   Chronic systolic heart failure ([place]) 06/25/2017   Echo 11/19: mild LVH, EF 40-45, inf-lat HK, Gr 1 DD, trivial MR   Complete heart block, transient ([place])    a. 06/2017 in setting of acute inf MI -> resolved after PCI.   COVID-19    Diabetes mellitus    Goiter    History of radiation therapy 06/14/2016 - 07/19/2016   Right Breast 50 Gy 25 fractions   Hypercholesteremia    Hypertension    Ischemic cardiomyopathy    a. EF 50% by cath and 40-45% by echo 06/2017.   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     albuterol (VENTOLIN HFA) 108 (90 Base) MCG/ACT inhaler SMARTSIG:1-2 Puff(s) By Mouth Every 4 Hours PRN     Alirocumab (PRALUENT) 75 MG/ML SOAJ INJECT 1 ML INTO THE SKIN EVERY 14 DAYS 6 mL 3   ALPRAZolam (XANAX) 0.25 MG tablet Take 0.25 mg by mouth as needed.     aspirin EC 81 MG tablet Take 1 tablet (81 mg total) by mouth daily. 90 tablet 3   bisoprolol (ZEBETA) 5 MG tablet Take 1/2 tablet by mouth daily 45 tablet 0   Coenzyme Q10 100 MG capsule Take 100 mg by mouth daily.     Continuous Blood Gluc Sensor (FREESTYLE LIBRE 3 SENSOR) MISC APPLY 1 SENSOR TO UPPER ARM  EVERY 14 DAYS FOR CONTINUOUS  GLUCOSE MONITORING 7 each 3   fluconazole (DIFLUCAN) 150 MG tablet Take 150 mg by mouth as needed.     furosemide  (LASIX) 20 MG tablet Take 1 tablet (20 mg total) by mouth daily. 90 tablet 0   glucose blood (FREESTYLE TEST STRIPS) test strip Use as instructed 100 each 12   insulin lispro (HUMALOG) 100 UNIT/ML cartridge INJECT SUBCUTANEOUSLY 3 TO 25  UNITS 3 TIMES DAILY WITH MEALS . USE PER SLIDING SCALE 75 mL 1   LANTUS SOLOSTAR 100 UNIT/ML Solostar Pen INJECT SUBCUTANEOUSLY 70  UNITS DAILY 75 mL 3   MOUNJARO 10 MG/0.5ML Pen Inject 10 mg into the skin once a week.     nitroGLYCERIN (NITROSTAT) 0.4 MG SL tablet DISSOLVE ONE TABLET UNDER TONGUE AS NEEDED FOR CHEST PAIN 25 tablet 4   ondansetron (ZOFRAN-ODT) 4 MG disintegrating tablet ondansetron 4 mg disintegrating tablet  TAKE 1 TABLET BY MOUTH EVERY 4 HOURS AS NEEDED FOR NAUSEA     sacubitril-valsartan (ENTRESTO) 49-51 MG Take 1 tablet by mouth 2 (two) times daily. 60 tablet 11   valACYclovir (VALTREX) 1000 MG tablet Take 1,000 mg by mouth 2 (two) times daily as needed (infection).     venlafaxine (EFFEXOR) 75 MG tablet Take 225 mg by mouth daily at 12 noon.     No current facility-administered medications on file prior to visit.    Allergies  Allergen Reactions   Hydrocodone Other (See Comments)    Severe anxiety Confusion    Invokana [Canagliflozin] Other (See Comments)    Constant yeast infections   Amlodipine Other (See Comments)    Muscles hurt   Carvedilol Other (See Comments)    Wheezing with '25mg'$  BID dosing NOT ALLERGIC 10/08/20   Chlorthalidone Other (See Comments)    Leg pain   Doxazosin     LEG CRAMPS    Evolocumab Other (See Comments)    Injection site pain   Hydralazine Other (See Comments)    Fatigue   Imdur [Isosorbide Nitrate] Other (See Comments)    Patient reported wheezing   Jardiance [Empagliflozin] Other (See Comments)    lightheadedness, weakness, low BP and yeast infection.  NOT ALLERGIC 10/08/20   Rosuvastatin Other (See Comments)    myalgias   Simvastatin Other (See Comments)    myalgias   Spironolactone Other  (See Comments)    Wheezing vs fatigue?   Atorvastatin Other (See Comments)    DIZZINESS   Compazine [Prochlorperazine] Anxiety     Assessment/Plan:  1. Hypertension -  Patient BP in room 147/86 which is above goal  of <130/80.  Patient has numerous medication intolerances and discussed them with patient. Unclear why hydralazine was discontinued. Intolerance list says "fatigue" however patient does not remember this and reports she does not mind if she is tired because it will help her sleep. Will restart hydralazine at this time twice a day. Patient to continue to check BP at home and return to clinic in 4 weeks for HTN appt #2.  Start hydralazine '25mg'$  BID Continue Metoprolol 25 daily Continue Furosemide '20mg'$  daily Continue Entresto 49-'51mg'$  BID Recheck in 4 weeks  Karren Cobble, PharmD, [place], [place], [place], [place], [place], [address] Phone: [phone], Fax: [phone]

## 2021-11-03 NOTE — Patient Instructions (Addendum)
It was nice meeting you today  We would like your blood pressure to be less than 130/80  Please continue:  Entresto 49-'51mg'$  twice a day Metoprolol '25mg'$  daily Furosemide '20mg'$  daily  We will restart hydralazine today at '25mg'$  twice a day  Continue to check your blood pressure about 1-2 hours after taking your medication  Continue your exercise routine with your daughter  We will see you back in 1 month  Karren Cobble, PharmD, [place], [place], [place], [place], [place], [address] Phone: [phone], Fax: [phone]

## 2021-11-06 ENCOUNTER — Encounter (HOSPITAL_BASED_OUTPATIENT_CLINIC_OR_DEPARTMENT_OTHER): Payer: Self-pay | Admitting: Cardiovascular Disease

## 2021-11-07 ENCOUNTER — Encounter (HOSPITAL_BASED_OUTPATIENT_CLINIC_OR_DEPARTMENT_OTHER): Payer: Self-pay

## 2021-11-07 NOTE — Telephone Encounter (Signed)
Entresto Prior Auth started: Key: [id number]- AWAITING DETERMINATION   Praulent Prior Auth Started: Key: [id number]- AWAITING DETERMINATION  ROUTING TO MELINDA PRATT AS FYI

## 2021-11-08 ENCOUNTER — Other Ambulatory Visit: Payer: Self-pay | Admitting: Physician Assistant

## 2021-11-08 NOTE — Telephone Encounter (Signed)
Rx(s) sent to pharmacy electronically.  

## 2021-11-10 ENCOUNTER — Telehealth (HOSPITAL_BASED_OUTPATIENT_CLINIC_OR_DEPARTMENT_OTHER): Payer: Self-pay | Admitting: *Deleted

## 2021-11-10 DIAGNOSIS — I5022 Chronic systolic (congestive) heart failure: Secondary | ICD-10-CM

## 2021-11-10 DIAGNOSIS — I10 Essential (primary) hypertension: Secondary | ICD-10-CM

## 2021-11-10 NOTE — Telephone Encounter (Signed)
Please advise 

## 2021-11-10 NOTE — Telephone Encounter (Signed)
Blood pressure readings morning: 177/100 174/93 170/97 169/89 165/83  Above readings sent via mychart message   Will forward to Dr Oval Linsey so she can review mychart message including blood pressure readings

## 2021-11-11 NOTE — Telephone Encounter (Signed)
RN returned call to patient to provide the following updates,    "Increase Entresto to 97/103 and hydralazine to '50mg'$ .  Keep tracking.   TCR "    Patient informed nurse that she has previously tried the 97-103 dose of entresto and had side effects so she will not take that and also informed the RN that she stopped her Hydralazine due to side effects. Patient states it was stopped at her recent visit, but this is not in the notes from visit with PHARMD on 7/13. Patient message from 7/16 does mention that she stopped hydrazine.   Encounter routed back to Dr. Oval Linsey for review.

## 2021-11-14 MED ORDER — METOPROLOL SUCCINATE ER 25 MG PO TB24: 50.0000 mg | ORAL_TABLET | Freq: Every day | ORAL | Status: DC

## 2021-11-14 MED ORDER — ENTRESTO 49-51 MG PO TABS
1.0000 | ORAL_TABLET | Freq: Two times a day (BID) | ORAL | 5 refills | Status: DC
Start: 2021-11-14 — End: 2021-11-29

## 2021-11-14 MED ORDER — MINOXIDIL 10 MG PO TABS
5.0000 mg | ORAL_TABLET | Freq: Every day | ORAL | 2 refills | Status: DC
Start: 2021-11-14 — End: 2021-12-07

## 2021-11-14 NOTE — Telephone Encounter (Signed)
You have previously seen patient, can you advise?

## 2021-11-29 MED ORDER — ENTRESTO 49-51 MG PO TABS: 1.0000 | ORAL_TABLET | Freq: Two times a day (BID) | ORAL | 1 refills | Status: DC

## 2021-11-29 NOTE — Addendum Note (Signed)
Addended by: Loel Dubonnet on: 11/29/2021 11:11 AM   Modules accepted: Orders

## 2021-11-29 NOTE — Telephone Encounter (Signed)
November 29, 2021 Skeet Latch, MD to Francella Solian, RMA  Me      11/29/21  1:13 PM You can just fill the 49/51 if that is what she is taking.  No hydralazine.   TCR   Has been done

## 2021-11-29 NOTE — Telephone Encounter (Signed)
Received fax from [facility] requesting New Rx for Entresto (dose not specified). Unsure what to send in. Please advise. Thank you!

## 2021-11-29 NOTE — Telephone Encounter (Signed)
After reviewing chart looks like [facility] Mam NP refilled Entresto at lower dose since patient refused to increase

## 2021-12-06 ENCOUNTER — Ambulatory Visit: Payer: 59

## 2021-12-07 ENCOUNTER — Ambulatory Visit (INDEPENDENT_AMBULATORY_CARE_PROVIDER_SITE_OTHER): Payer: 59 | Admitting: Family

## 2021-12-07 ENCOUNTER — Encounter (HOSPITAL_BASED_OUTPATIENT_CLINIC_OR_DEPARTMENT_OTHER): Payer: Self-pay | Admitting: Family

## 2021-12-07 VITALS — BP 142/82 | HR 80 | Ht 72.0 in | Wt 251.0 lb

## 2021-12-07 DIAGNOSIS — I25118 Atherosclerotic heart disease of native coronary artery with other forms of angina pectoris: Secondary | ICD-10-CM

## 2021-12-07 DIAGNOSIS — I10 Essential (primary) hypertension: Secondary | ICD-10-CM

## 2021-12-07 DIAGNOSIS — G4733 Obstructive sleep apnea (adult) (pediatric): Secondary | ICD-10-CM | POA: Diagnosis not present

## 2021-12-07 DIAGNOSIS — Z9989 Dependence on other enabling machines and devices: Secondary | ICD-10-CM

## 2021-12-07 DIAGNOSIS — I5022 Chronic systolic (congestive) heart failure: Secondary | ICD-10-CM | POA: Diagnosis not present

## 2021-12-07 DIAGNOSIS — E785 Hyperlipidemia, unspecified: Secondary | ICD-10-CM

## 2021-12-07 MED ORDER — FUROSEMIDE 40 MG PO TABS: 40.0000 mg | ORAL_TABLET | Freq: Every day | ORAL | 3 refills | Status: DC

## 2021-12-07 NOTE — Telephone Encounter (Signed)
Per Covermymeds   Entresto 49-'51mg'$    'This medication or product was previously approved on [id number] from 11/01/2020 to 11/01/2022. You will be able to fill a prescription for this medication at your pharmacy. If your pharmacy has questions regarding the processing of your prescription, please have them call the [facility] pharmacy help desk at [phone]."   Praulent   "This medication or product was previously approved on [id number] from 05/31/21 to 05/31/22. You will be able to fill a prescription for this medication at your pharmacy. If your pharmacy has questions regarding the processing of your prescription, please have them call the [facility] pharmacy help desk at [phone]."   Patient notified via mychart message

## 2021-12-07 NOTE — Progress Notes (Signed)
Advanced Hypertension Assessment:    Date:  12/07/2021   ID:  Destiny Beasley, DOB 1956-04-13, MRN [id number]  PCP:  Lawerance Cruel, MD  Cardiologist:  Sherren Mocha, MD / Dr. [facility] Nephrologist:  Referring MD: Lawerance Cruel, MD   CC: Hypertension  History of Present Illness:    Destiny Beasley is a 66 y.o. female with a hx of CAD s/p inferior-lateral STEMI 06/2017 with DES to LCx, HFrEF, ICM, DM2, hyperthyroidism, TIA, OSA, breast cancer, HTN, HLD with statin intolerance, COVID19, pituitary tumor s/p resection, meningioma, anxiety  here to follow up in the [facility].   CAD dates back to inf-lat STEMI 06/2017 with DES to pLCx. Cardiac catheterization 04/2020 with patent LCx stent and pLAD with 40% stenosis. Her LVEF since her STEMI has been 40-45%.    Via telemedicine 08/2020 started on Lasix. Seen 10/08/20 with complaint of hot flashes which she attributed to Carvedilol. As leaving office developed chest pain and transported to ED - symptoms resolved with nitroglycerin and Xanax, Hs-troponin negative x2, d-dimer minimally elevated 0.62 though negative when adjusted for age. Head CT unremarkable.    ED visit 10/19/20 was COVID positive treated with antiviral. Visit 11/01/20, 12/14/20, and 12/28/20 with optimization of antihypertensive regimen.  Seen 01/07/21 and Destiny Beasley increased to 97-'103mg'$  BID. Did not tolerate increased dose and reduce to 49-'51mg'$  BID. 07/2021 she was started on doxazosin. Echo 07/2021 LVEF 45-50%, hypokinesis inferolateral wall, gr1DDtrivial MR. She was referred to [facility].   Establish with [facility] 09/2021. Hypertension initially diagnosed in her 60s and has always been difficult to control. Enrolled in [employer]. Referred to [facility]. Metoprolol transitioned to Bisoprolol. She self discontinued after 2 weeks due to muscle pain.    11/03/21 Hydralazine '25mg'$  BID started by pharmacy team. She discontinued to to  reported chest pain. She was stared on Minoxidil 11/07/21. She self discontinued due to muscle pain, cramps.    Presents today for follow up. Excited for trip to [place] in September and [place] in October. Blood pressure at home routinely in the 140s. Exercising with her daughter in the morning for 30 minutes by walking on the treadmill. No chest discomfort nor exertional dyspnea. Reports no shortness of breath nor dyspnea on exertion. Reports no chest pain, pressure, or tightness. No edema, orthopnea, PND. Reports no palpitations.  Awaiting gel shots in her right knee. Dr. Buddy Duty of endocrinology recently started Jardiance and tolerating without previous side effects.   Previous antihypertensives: Amlodipine (muscle pain) Coreg (wheeze with '25mg'$  dose) Chlorthalidone (leg pain) Spironolactone (wheeze/fatigue) Jardiance (hallucinations, yeast infections) - now tolerating '10mg'$  dose Entresto (intolerant to 97-'103mg'$  dose with cramps, tolerates 49-'51mg'$  dose) Hydralazine (fatigue) Imdur (wheeze) Doxazosin (leg cramps)  Past Medical History:  Diagnosis Date   Anemia    Anxiety    CAD in native artery    a. Inf STEMI 12/5186 complicated by cardiogenic shock/complete heart block s/p emergent cath showing culprit large dominant LCx s/p aspiration thrombectomy and DES, EF 50% by cath, 40-45% by echo.   Chronic systolic heart failure ([place]) 06/25/2017   Echo 11/19: mild LVH, EF 40-45, inf-lat HK, Gr 1 DD, trivial MR   Complete heart block, transient ([place])    a. 06/2017 in setting of acute inf MI -> resolved after PCI.   COVID-19    Diabetes mellitus    Goiter    History of radiation therapy 06/14/2016 - 07/19/2016   Right Breast 50 Gy 25 fractions  Hypercholesteremia    Hypertension    Ischemic cardiomyopathy    a. EF 50% by cath and 40-45% by echo 06/2017.   Leg pain    ABIs 07/2019: Normal (R 1.27; L 1.25)   Malignant neoplasm of upper-outer quadrant of right female breast ([place]) 02/24/2016   Meningioma  [facility])    Nuclear stress test    Nuclear stress test 10/19: EF 35, inferolateral, apical scar; inferior, apical inferior infarct with mild peri-infarct ischemia; high risk   Obesity    Personal history of chemotherapy    Personal history of radiation therapy    Pituitary tumor    Seasonal asthma    Sleep apnea    does not use every night   Stroke [facility])    TIA - Dec 1 st, 2017    Past Surgical History:  Procedure Laterality Date   ABDOMINAL HYSTERECTOMY     BRAIN MENINGIOMA EXCISION  2005   BREAST BIOPSY     BREAST LUMPECTOMY Right    2017   BREAST LUMPECTOMY WITH RADIOACTIVE SEED LOCALIZATION Right 04/20/2016   Procedure: RIGHT BREAST LUMPECTOMY WITH RADIOACTIVE SEED LOCALIZATION;  Surgeon: Fanny Skates, MD;  Location: [place];  Service: General;  Laterality: Right;   BREAST REDUCTION SURGERY Bilateral 10/23/2016   Procedure: BILATERAL BREAST REDUCTION WITH LIPOSUCTION ASSISTANCE;  Surgeon: Cristine Polio, MD;  Location: [place];  Service: Plastics;  Laterality: Bilateral;   CESAREAN SECTION     CORONARY STENT INTERVENTION N/A 06/25/2017   Procedure: CORONARY STENT INTERVENTION;  Surgeon: Sherren Mocha, MD;  Location: [place] [facility];  Service: Cardiovascular;  Laterality: N/A;   CORONARY/GRAFT ACUTE MI REVASCULARIZATION N/A 06/25/2017   Procedure: Coronary/Graft Acute MI Revascularization;  Surgeon: Sherren Mocha, MD;  Location: [place] [facility];  Service: Cardiovascular;  Laterality: N/A;   FOOT SURGERY Bilateral 2016   hammer toe surgery   LEFT HEART CATH AND CORONARY ANGIOGRAPHY N/A 06/25/2017   Procedure: LEFT HEART CATH AND CORONARY ANGIOGRAPHY;  Surgeon: Sherren Mocha, MD;  Location: [place] [facility];  Service: Cardiovascular;  Laterality: N/A;   LEFT HEART CATH AND CORONARY ANGIOGRAPHY N/A 05/13/2020   Procedure: LEFT HEART CATH AND CORONARY ANGIOGRAPHY;  Surgeon: Lorretta Harp, MD;  Location: [place] [facility];  Service:  Cardiovascular;  Laterality: N/A;   MENISCUS REPAIR Left 2015   PITUITARY SURGERY     REDUCTION MAMMAPLASTY     RIGHT HEART CATH N/A 06/25/2017   Procedure: RIGHT HEART CATH;  Surgeon: Sherren Mocha, MD;  Location: [place] [facility];  Service: Cardiovascular;  Laterality: N/A;   THYROID SURGERY      Current Medications: Current Meds  Medication Sig   ADVAIR HFA 115-21 MCG/ACT inhaler as needed.   albuterol (VENTOLIN HFA) 108 (90 Base) MCG/ACT inhaler SMARTSIG:1-2 Puff(s) By Mouth Every 4 Hours PRN   ALPRAZolam (XANAX) 0.25 MG tablet Take 0.25 mg by mouth as needed.   aspirin EC 81 MG tablet Take 1 tablet (81 mg total) by mouth daily.   Coenzyme Q10 100 MG capsule Take 100 mg by mouth daily.   Continuous Blood Gluc Sensor (FREESTYLE LIBRE 3 SENSOR) MISC APPLY 1 SENSOR TO UPPER ARM  EVERY 14 DAYS FOR CONTINUOUS  GLUCOSE MONITORING   Cyanocobalamin (VITAMIN B-12 PO) Take by mouth daily.   glucose blood (FREESTYLE TEST STRIPS) test strip Use as instructed   JARDIANCE 10 MG TABS tablet Take 10 mg by mouth daily.   LANTUS SOLOSTAR 100 UNIT/ML Solostar  Pen INJECT SUBCUTANEOUSLY 70  UNITS DAILY (Patient taking differently: Inject 64 Units into the skin daily.)   metoprolol succinate (TOPROL-XL) 50 MG 24 hr tablet Take 50 mg by mouth daily. Take with or immediately following a meal.   nitroGLYCERIN (NITROSTAT) 0.4 MG SL tablet DISSOLVE ONE TABLET UNDER TONGUE AS NEEDED FOR CHEST PAIN   ondansetron (ZOFRAN-ODT) 4 MG disintegrating tablet ondansetron 4 mg disintegrating tablet  TAKE 1 TABLET BY MOUTH EVERY 4 HOURS AS NEEDED FOR NAUSEA   sacubitril-valsartan (ENTRESTO) 49-51 MG Take 1 tablet by mouth 2 (two) times daily.   tirzepatide (MOUNJARO) 10 MG/0.5ML Pen Inject 10 mg into the skin once a week.   valACYclovir (VALTREX) 1000 MG tablet Take 1,000 mg by mouth 2 (two) times daily as needed (infection).   venlafaxine (EFFEXOR) 75 MG tablet Take 225 mg by mouth daily.   VITAMIN D PO Take by mouth  daily.   [DISCONTINUED] furosemide (LASIX) 20 MG tablet Take 1 tablet (20 mg total) by mouth daily.     Allergies:   Hydrocodone, Invokana [canagliflozin], Amlodipine, Carvedilol, Chlorthalidone, Doxazosin, Evolocumab, Hydralazine, Imdur [isosorbide nitrate], Jardiance [empagliflozin], Rosuvastatin, Simvastatin, Spironolactone, Atorvastatin, and Compazine [prochlorperazine]   Social History   Socioeconomic History   Marital status: Single    Spouse name: Not on file   Number of children: Not on file   Years of education: Not on file   Highest education level: Bachelor's degree (e.g., BA, AB, BS)  Occupational History   Occupation: full time  Tobacco Use   Smoking status: Never   Smokeless tobacco: Never  Vaping Use   Vaping Use: Never used  Substance and Sexual Activity   Alcohol use: Yes    Alcohol/week: 1.0 standard drink of alcohol    Types: 1 Glasses of wine per week    Comment: 1 glass monthly   Drug use: No   Sexual activity: Not on file    Comment: no cycles; pt had hyst  Other Topics Concern   Not on file  Social History Narrative   Lives alone   Right Handed   Drinks 1-2 cups caffeine daily   Social Determinants of Health   Financial Resource Strain: Low Risk  (10/04/2021)   Overall Financial Resource Strain (CARDIA)    Difficulty of Paying Living Expenses: Not hard at all  Food Insecurity: No Food Insecurity (10/04/2021)   Hunger Vital Sign    Worried About Running Out of Food in the Last Year: Never true    Ran Out of Food in the Last Year: Never true  Transportation Needs: No Transportation Needs (10/04/2021)   PRAPARE - Hydrologist (Medical): No    Lack of Transportation (Non-Medical): No  Physical Activity: Inactive (10/04/2021)   Exercise Vital Sign    Days of Exercise per Week: 0 days    Minutes of Exercise per Session: 0 min  Stress: Not on file  Social Connections: Not on file     Family History: The patient's family  history includes Alcohol abuse in her brother; Diabetes in her mother; Heart attack in her mother; Hyperlipidemia in her mother; Hypertension in her mother and sister; Stroke in her maternal uncle.  ROS:   Please see the history of present illness.     All other systems reviewed and are negative.  EKGs/Labs/Other Studies Reviewed:    EKG:  No EKG today  Echo 08/17/21  1. Hypokinesis of the inferolateral wall with overall mild LV  dysfunction.  2. Left ventricular ejection fraction, by estimation, is 45 to 50%. The  left ventricle has mildly decreased function. The left ventricle  demonstrates regional wall motion abnormalities (see scoring  diagram/findings for description). Left ventricular  diastolic parameters are consistent with Grade I diastolic dysfunction  (impaired relaxation).   3. Right ventricular systolic function is normal. The right ventricular  size is normal.   4. Left atrial size was mildly dilated.   5. The mitral valve is normal in structure. Trivial mitral valve  regurgitation. No evidence of mitral stenosis.   6. The aortic valve is tricuspid. Aortic valve regurgitation is not  visualized. Aortic valve sclerosis is present, with no evidence of aortic  valve stenosis.   Comparison(s): EF 40%,mild hypokinesis of the left ventricular, mid-apical  inferior wall and inferolateral wall, trivial AI.  Echocardiogram 05/13/20 EF 40-45, inf and inf-lat HK, normal RVSF, trivial MR, trivial AI   Cardiac catheterization 05/13/20 LAD prox 40 LCx stent patent EF 45-50    ABIs 07/2019 Normal    Echocardiogram 09/05/2018 EF 40-45, inf-lat AK, mild LVH, Gr 1 DD, normal RVSF   Echo 03/07/18 Mild LVH, EF 40-45, GLS -11%, inf-lat HK, Gr 1 DD, trivial MR   Nuclear stress test 02/12/18 EF 35, inferolateral, apical scar; inferior, apical inferior infarct with mild peri-infarct ischemia; high risk   Echo 06/26/17 EF 40-45, inferolateral hypokinesis, mild MR   Cardiac  catheterization 06/25/17 LM luminal irregularities LAD luminal irregularities, proximal 30 LCx proximal 100 RCA irregularities EF 45-50, inferolateral akinesis PCI: 4 x 20 mm Synergy DES to the proximal LCx   Echo 03/25/16 Moderate LVH, EF 60-65, normal wall motion   Carotid US 12/17 Bilateral ICA 1-39  Recent Labs: 01/18/2021: TSH 1.26 06/08/2021: ALT 22; B Natriuretic Peptide 46.6; Hemoglobin 15.1; Magnesium 1.8; Platelets 279 09/01/2021: BUN 19; Creatinine, Ser 0.99; Potassium 4.7; Sodium 141   Recent Lipid Panel    Component Value Date/Time   CHOL 120 10/06/2020 0743   TRIG 63 10/06/2020 0743   HDL 54 10/06/2020 0743   CHOLHDL 2.2 10/06/2020 0743   CHOLHDL 2 08/12/2018 0944   VLDL 19.2 08/12/2018 0944   LDLCALC 52 10/06/2020 0743   LDLDIRECT 67 07/15/2020 1609   LDLDIRECT 87.0 06/04/2019 0835    Physical Exam:   VS:  BP (!) 142/82   Pulse 80   Ht 6' (1.829 m)   Wt 251 lb (113.9 kg)   BMI 34.04 kg/m  , BMI Body mass index is 34.04 kg/m. GENERAL:  Well appearing HEENT: Pupils equal round and reactive, fundi not visualized, oral mucosa unremarkable NECK:  No jugular venous distention, waveform within normal limits, carotid upstroke brisk and symmetric, no bruits, no thyromegaly LYMPHATICS:  No cervical adenopathy LUNGS:  Clear to auscultation bilaterally HEART:  RRR.  PMI not displaced or sustained,S1 and S2 within normal limits, no S3, no S4, no clicks, no rubs, no murmurs ABD:  Flat, positive bowel sounds normal in frequency in pitch, no bruits, no rebound, no guarding, no midline pulsatile mass, no hepatomegaly, no splenomegaly EXT:  2 plus pulses throughout, no edema, no cyanosis no clubbing SKIN:  No rashes no nodules NEURO:  Cranial nerves II through XII grossly intact, motor grossly intact throughout PSYCH:  Cognitively intact, oriented to person place and time   ASSESSMENT/PLAN:    CAD - Stable with no anginal symptoms. No indication for ischemic evaluation.    GDMT includes Praluent, Aspirin, Metoprolol. Heart healthy diet and regular cardiovascular exercise encouraged.  HFrEF / ICM - 05/13/20 LVEF 40-45%.  07/2021 LVEF 45-50%. GDMT includes Toprol (previous intolerance to Carvedilol), Entresto, Lasix, Jardiance. . Did not previously tolerate Jardiance due to frequent yeast infection and patient report of hallucinations but tolerating '10mg'$  dose as initiated by endocrinology. Did not tolerate max dose Entresto due to self reported muscle cramps. Medication adjustment difficult given multiple medication intolerances. Heart healthy diet and regular cardiovascular exercise encouraged.  Educated to report weight gain of 2 lb overnight or 5 lb in 1 week. Increase lasix to '40mg'$  QD, BMP in 1-2 weeks for BP control.   HLD, LDL goal <70 - 10/06/20 LDL 52. Continue Praluent. Intolerance to Rosuvastatin, Simvastatin, Atorvastatin with myalgias.Heart healthy diet and regular cardiovascular exercise encouraged.     HTN - BP not to goal of <130/80. Multiple intolerances listed above. BP routinely 140s at home. As she has not tolerated new agents, will increase Lasix to '40mg'$  QD with BMP in 1-2 weeks for monitoring. Continue heart healthy diet and regular exercise. Working with endocrinology on weight loss and DM2 management.    Anxiety - Continue to follow with PCP.    OSA - CPAP compliance encouraged. Endorses wearing regularly.   Screening for Secondary Hypertension:     10/04/2021    9:43 AM  Causes  Drugs/Herbals Screened     - Comments limitis sodium and caffeine.  Uses Aleve bid.  Renovascular HTN Screened     - Comments No renal artery stenosis on CT 05/2021  Sleep Apnea Screened     - Comments uses BiPAP  Thyroid Disease Screened  Hyperaldosteronism Screened     - Comments No adenomas on CT in 2023  Pheochromocytoma Screened     - Comments No adenomas or symptoms.  Cushing's Syndrome N/A  Hyperparathyroidism Screened  Coarctation of the Aorta Screened      - Comments BP symmetric  Compliance Screened    Relevant Labs/Studies:    Latest Ref Rng & Units 09/01/2021    9:20 AM 06/08/2021    9:30 AM 03/15/2021    8:18 AM  Basic Labs  Sodium 134 - 144 mmol/L 141  136  143   Potassium 3.5 - 5.2 mmol/L 4.7  3.9  4.4   Creatinine 0.57 - 1.00 mg/dL 0.99  0.88  1.01        Latest Ref Rng & Units 01/18/2021    8:46 AM 11/30/2020    8:22 AM  Thyroid   TSH 0.35 - 5.50 uIU/mL 1.26  3.57              Latest Ref Rng & Units 11/11/2011    1:14 PM  Cortisol  Cortisol  ug/dL 9.6          Disposition:    FU with PharmD in 1 month    Medication Adjustments/Labs and Tests Ordered: Current medicines are reviewed at length with the patient today.  Concerns regarding medicines are outlined above.  Orders Placed This Encounter  Procedures   Basic metabolic panel   Meds ordered this encounter  Medications   furosemide (LASIX) 40 MG tablet    Sig: Take 1 tablet (40 mg total) by mouth daily.    Dispense:  90 tablet    Refill:  3    Signed, Loel Dubonnet, NP  12/07/2021 11:09 AM    [place]

## 2021-12-07 NOTE — Patient Instructions (Addendum)
Medication Instructions:  Your physician has recommended you make the following change in your medication:   INCREASE Furosemide (Lasix) to '40mg'$  daily  *If you need a refill on your cardiac medications before your next appointment, please call your pharmacy*   Lab Work: Your physician recommends that you return for lab work in 1-2 weeks for BMP.   If you have labs (blood work) drawn today and your tests are completely normal, you will receive your results only by: [place] (if you have MyChart) OR A paper copy in the mail If you have any lab test that is abnormal or we need to change your treatment, we will call you to review the results.   Testing/Procedures: None ordered today.    Follow-Up: At [facility], you and your health needs are our priority.  As part of our continuing mission to provide you with exceptional heart care, we have created designated Provider Care Teams.  These Care Teams include your primary Cardiologist (physician) and Advanced Practice Providers (APPs -  Physician Assistants and Nurse Practitioners) who all work together to provide you with the care you need, when you need it.  We recommend signing up for the patient portal called "MyChart".  Sign up information is provided on this After Visit Summary.  MyChart is used to connect with patients for Virtual Visits (Telemedicine).  Patients are able to view lab/test results, encounter notes, upcoming appointments, etc.  Non-urgent messages can be sent to your provider as well.   To learn more about what you can do with MyChart, go to [email or web address].    Your next appointment:   Follow up 9/18 at 9:30am with PharmD at [facility] OFFICE   Other Instructions  Heart Healthy Diet Recommendations: A low-salt diet is recommended. Meats should be grilled, baked, or boiled. Avoid fried foods. Focus on lean protein sources like fish or chicken with vegetables and fruits. The American Heart Association  is a Microbiologist!  American Heart Association Diet and Lifeystyle Recommendations   Exercise recommendations: The American Heart Association recommends 150 minutes of moderate intensity exercise weekly. Try 30 minutes of moderate intensity exercise 4-5 times per week. This could include walking, jogging, or swimming.   Important Information About Sugar

## 2021-12-09 ENCOUNTER — Other Ambulatory Visit (HOSPITAL_BASED_OUTPATIENT_CLINIC_OR_DEPARTMENT_OTHER): Payer: Self-pay

## 2021-12-09 MED ORDER — METOPROLOL SUCCINATE ER 50 MG PO TB24: 50.0000 mg | ORAL_TABLET | Freq: Every day | ORAL | 3 refills | Status: DC

## 2021-12-20 ENCOUNTER — Encounter (HOSPITAL_BASED_OUTPATIENT_CLINIC_OR_DEPARTMENT_OTHER): Payer: Self-pay

## 2021-12-22 LAB — BASIC METABOLIC PANEL
BUN/Creatinine Ratio: 15 (ref 12–28)
BUN: 13 mg/dL (ref 8–27)
CO2: 22 mmol/L (ref 20–29)
Calcium: 10 mg/dL (ref 8.7–10.3)
Chloride: 103 mmol/L (ref 96–106)
Creatinine, Ser: 0.87 mg/dL (ref 0.57–1.00)
Glucose: 167 mg/dL — ABNORMAL HIGH (ref 70–99)
Potassium: 4.8 mmol/L (ref 3.5–5.2)
Sodium: 142 mmol/L (ref 134–144)
eGFR: 75 mL/min/{1.73_m2} (ref >59)

## 2021-12-24 ENCOUNTER — Other Ambulatory Visit (HOSPITAL_BASED_OUTPATIENT_CLINIC_OR_DEPARTMENT_OTHER): Payer: Self-pay | Admitting: Family

## 2021-12-24 DIAGNOSIS — I10 Essential (primary) hypertension: Secondary | ICD-10-CM

## 2021-12-26 ENCOUNTER — Encounter (HOSPITAL_BASED_OUTPATIENT_CLINIC_OR_DEPARTMENT_OTHER): Payer: Self-pay

## 2021-12-28 ENCOUNTER — Telehealth (HOSPITAL_BASED_OUTPATIENT_CLINIC_OR_DEPARTMENT_OTHER): Payer: Self-pay

## 2021-12-28 NOTE — Telephone Encounter (Signed)
-----   Message from Loel Dubonnet, NP sent at 12/28/2021 11:14 AM EDT ----- Doxazosin previously removed from med list due to s/e of leg cramps. Can we please call to ensure she is not taking? If not taking, update pharmacy to remove from med list.  Thanks! Loel Dubonnet, NP  ----- Message ----- From: Laurence Compton Sent: 12/28/2021   8:06 AM EDT To: Loel Dubonnet, NP

## 2021-12-28 NOTE — Telephone Encounter (Signed)
Spoke to pt, she is not taking. Called pharmacy and they have removed from her medications.

## 2021-12-28 NOTE — Telephone Encounter (Signed)
Thank you!!  Loel Dubonnet, NP

## 2022-01-07 NOTE — Progress Notes (Deleted)
01/07/2022 Destiny Beasley 1956-03-07 [id number]   HPI:  Destiny Beasley is a 66 y.o. female patient of Destiny Beasley, with a [place] below who presents today for hypertension clinic evaluation.  She was seen by Destiny. Oval Beasley back in June (168/94) and noted that she had troubles with hypertension since she was in her 2's.   She also appears to have multiple medication sensitivities, making it harder to treat.  Destiny. Oval Beasley switched her metoprolol to bisoprolol 2.5 mg daily.  She was then seen in July by Destiny Beasley (147/86) and her pressure had improved some, but was still above goal.  She did not tolerate the bisoprolol (muscle pain) and restarted metoprolol on her own.  He re-challenged her with hydralazine 25 mg bid (she stated maybe any fatigue would help her sleep better).  Her next visit was with Destiny Beasley and it was noted that she had stopped hydralazine.  Because of these multiple sensitivities, her furosemide was increased to 40 mg daily.    She returns today for follow up.    Start minoxidil? 5 mg daily    Past Medical History: ASCVD STEMI 3/19, thrombectomy and DES to dominant LCx  CHF  On Entresto 49/51, but unable to increase 2/2 muscle pain  CVA TIA 03/2016  DM2         Blood Pressure Goal:  130/80  Current Medications:   Entresto 49/51 mg bid, metoprolol succ 50 mg qd, furosemide 40 mg qd  Family Hx:     Social Hx:      Diet:      Exercise:   Home BP readings:      Intolerances:  amlodipine - myalgias, carvedilol - wheezing with 25 mg dose, chlorthalidone - leg pain, spironolactone - wheeze/fatigue, Jardiance - yeast infection/hallucinations, hydralazine - fatigue, imdur - wheezing, doxazosin - lege cramps  Labs:    Wt Readings from Last 3 Encounters:  12/07/21 251 lb (113.9 kg)  11/03/21 254 lb 6.4 oz (115.4 kg)  10/04/21 260 lb 3.2 oz (118 kg)   BP Readings from Last 3 Encounters:  12/07/21 (!) 142/82  11/03/21 (!) 147/86  10/04/21 (!) 168/94    Pulse Readings from Last 3 Encounters:  12/07/21 80  11/03/21 91  10/04/21 90    Current Outpatient Medications  Medication Sig Dispense Refill   ADVAIR HFA 115-21 MCG/ACT inhaler as needed.     albuterol (VENTOLIN HFA) 108 (90 Base) MCG/ACT inhaler SMARTSIG:1-2 Puff(s) By Mouth Every 4 Hours PRN     ALPRAZolam (XANAX) 0.25 MG tablet Take 0.25 mg by mouth as needed.     aspirin EC 81 MG tablet Take 1 tablet (81 mg total) by mouth daily. 90 tablet 3   Coenzyme Q10 100 MG capsule Take 100 mg by mouth daily.     Continuous Blood Gluc Sensor (FREESTYLE LIBRE 3 SENSOR) MISC APPLY 1 SENSOR TO UPPER ARM  EVERY 14 DAYS FOR CONTINUOUS  GLUCOSE MONITORING 7 each 3   Cyanocobalamin (VITAMIN B-12 PO) Take by mouth daily.     furosemide (LASIX) 40 MG tablet Take 1 tablet (40 mg total) by mouth daily. 90 tablet 3   glucose blood (FREESTYLE TEST STRIPS) test strip Use as instructed 100 each 12   JARDIANCE 10 MG TABS tablet Take 10 mg by mouth daily.     LANTUS SOLOSTAR 100 UNIT/ML Solostar Pen INJECT SUBCUTANEOUSLY 70  UNITS DAILY (Patient taking differently: Inject 64 Units into the skin daily.) 75 mL  3   metoprolol succinate (TOPROL-XL) 50 MG 24 hr tablet Take 1 tablet (50 mg total) by mouth daily. Take with or immediately following a meal. 90 tablet 3   nitroGLYCERIN (NITROSTAT) 0.4 MG SL tablet DISSOLVE ONE TABLET UNDER TONGUE AS NEEDED FOR CHEST PAIN 25 tablet 4   ondansetron (ZOFRAN-ODT) 4 MG disintegrating tablet ondansetron 4 mg disintegrating tablet  TAKE 1 TABLET BY MOUTH EVERY 4 HOURS AS NEEDED FOR NAUSEA     sacubitril-valsartan (ENTRESTO) 49-51 MG Take 1 tablet by mouth 2 (two) times daily. 180 tablet 1   tirzepatide (MOUNJARO) 10 MG/0.5ML Pen Inject 10 mg into the skin once a week.     valACYclovir (VALTREX) 1000 MG tablet Take 1,000 mg by mouth 2 (two) times daily as needed (infection).     venlafaxine (EFFEXOR) 75 MG tablet Take 225 mg by mouth daily.     VITAMIN D PO Take by mouth  daily.     No current facility-administered medications for this visit.    Allergies  Allergen Reactions   Hydrocodone Other (See Comments)    Severe anxiety Confusion    Invokana [Canagliflozin] Other (See Comments)    Constant yeast infections   Amlodipine Other (See Comments)    Muscles hurt   Carvedilol Other (See Comments)    Wheezing with '25mg'$  BID dosing NOT ALLERGIC 10/08/20   Chlorthalidone Other (See Comments)    Leg pain   Doxazosin     LEG CRAMPS    Evolocumab Other (See Comments)    Injection site pain   Hydralazine Other (See Comments)    Fatigue   Imdur [Isosorbide Nitrate] Other (See Comments)    Patient reported wheezing   Jardiance [Empagliflozin] Other (See Comments)    lightheadedness, weakness, low BP and yeast infection.  NOT ALLERGIC 10/08/20   Rosuvastatin Other (See Comments)    myalgias   Simvastatin Other (See Comments)    myalgias   Spironolactone Other (See Comments)    Wheezing vs fatigue?   Atorvastatin Other (See Comments)    DIZZINESS   Compazine [Prochlorperazine] Anxiety    Past Medical History:  Diagnosis Date   Anemia    Anxiety    CAD in native artery    a. Inf STEMI 10/8936 complicated by cardiogenic shock/complete heart block s/p emergent cath showing culprit large dominant LCx s/p aspiration thrombectomy and DES, EF 50% by cath, 40-45% by echo.   Chronic systolic heart failure ([place]) 06/25/2017   Echo 11/19: mild LVH, EF 40-45, inf-lat HK, Gr 1 DD, trivial MR   Complete heart block, transient ([place])    a. 06/2017 in setting of acute inf MI -> resolved after PCI.   COVID-19    Diabetes mellitus    Goiter    History of radiation therapy 06/14/2016 - 07/19/2016   Right Breast 50 Gy 25 fractions   Hypercholesteremia    Hypertension    Ischemic cardiomyopathy    a. EF 50% by cath and 40-45% by echo 06/2017.   Leg pain    ABIs 07/2019: Normal (R 1.27; L 1.25)   Malignant neoplasm of upper-outer quadrant of right female breast  ([place]) 02/24/2016   Meningioma [facility])    Nuclear stress test    Nuclear stress test 10/19: EF 35, inferolateral, apical scar; inferior, apical inferior infarct with mild peri-infarct ischemia; high risk   Obesity    Personal history of chemotherapy    Personal history of radiation therapy    Pituitary tumor  Seasonal asthma    Sleep apnea    does not use every night   Stroke [facility])    TIA - Dec 1 st, 2017    There were no vitals taken for this visit.  No BP recorded.  {Refresh Note OR Click here to enter BP  :1}***    No problem-specific Assessment & Plan notes found for this encounter.   Tommy Medal PharmD CPP [place] [address] [place], [place] [address] [phone]

## 2022-01-09 ENCOUNTER — Ambulatory Visit: Payer: 59 | Attending: Cardiology

## 2022-01-13 ENCOUNTER — Other Ambulatory Visit: Payer: Self-pay

## 2022-01-30 ENCOUNTER — Ambulatory Visit (HOSPITAL_BASED_OUTPATIENT_CLINIC_OR_DEPARTMENT_OTHER): Payer: 59 | Admitting: Cardiovascular Disease

## 2022-03-04 ENCOUNTER — Encounter (HOSPITAL_BASED_OUTPATIENT_CLINIC_OR_DEPARTMENT_OTHER): Payer: Self-pay | Admitting: Emergency Medicine

## 2022-03-04 ENCOUNTER — Emergency Department (HOSPITAL_BASED_OUTPATIENT_CLINIC_OR_DEPARTMENT_OTHER): Payer: 59

## 2022-03-04 ENCOUNTER — Emergency Department (HOSPITAL_BASED_OUTPATIENT_CLINIC_OR_DEPARTMENT_OTHER)
Admission: EM | Admit: 2022-03-04 | Discharge: 2022-03-04 | Disposition: A | Discharge status: Home / Self Care | Payer: 59 | Attending: Emergency Medicine | Admitting: Emergency Medicine

## 2022-03-04 ENCOUNTER — Other Ambulatory Visit: Payer: Self-pay

## 2022-03-04 DIAGNOSIS — Z7982 Long term (current) use of aspirin: Secondary | ICD-10-CM | POA: Diagnosis not present

## 2022-03-04 DIAGNOSIS — R109 Unspecified abdominal pain: Secondary | ICD-10-CM | POA: Insufficient documentation

## 2022-03-04 DIAGNOSIS — E119 Type 2 diabetes mellitus without complications: Secondary | ICD-10-CM | POA: Insufficient documentation

## 2022-03-04 DIAGNOSIS — Z794 Long term (current) use of insulin: Secondary | ICD-10-CM | POA: Insufficient documentation

## 2022-03-04 DIAGNOSIS — Z853 Personal history of malignant neoplasm of breast: Secondary | ICD-10-CM | POA: Diagnosis not present

## 2022-03-04 DIAGNOSIS — I110 Hypertensive heart disease with heart failure: Secondary | ICD-10-CM | POA: Diagnosis not present

## 2022-03-04 DIAGNOSIS — I2510 Atherosclerotic heart disease of native coronary artery without angina pectoris: Secondary | ICD-10-CM | POA: Diagnosis not present

## 2022-03-04 DIAGNOSIS — Z79899 Other long term (current) drug therapy: Secondary | ICD-10-CM | POA: Insufficient documentation

## 2022-03-04 DIAGNOSIS — I5022 Chronic systolic (congestive) heart failure: Secondary | ICD-10-CM | POA: Insufficient documentation

## 2022-03-04 DIAGNOSIS — Z8616 Personal history of COVID-19: Secondary | ICD-10-CM | POA: Diagnosis not present

## 2022-03-04 DIAGNOSIS — R110 Nausea: Secondary | ICD-10-CM | POA: Insufficient documentation

## 2022-03-04 LAB — URINALYSIS, ROUTINE W REFLEX MICROSCOPIC
Bilirubin Urine: NEGATIVE
Glucose, UA: NEGATIVE mg/dL
Ketones, ur: NEGATIVE mg/dL
Leukocytes,Ua: NEGATIVE
Nitrite: NEGATIVE
Protein, ur: NEGATIVE mg/dL
Specific Gravity, Urine: 1.01 (ref 1.005–1.030)
pH: 5.5 (ref 5.0–8.0)

## 2022-03-04 LAB — CBC WITH DIFFERENTIAL/PLATELET
Abs Immature Granulocytes: 0.02 10*3/uL (ref 0.00–0.07)
Basophils Absolute: 0 10*3/uL (ref 0.0–0.1)
Basophils Relative: 0 %
Eosinophils Absolute: 0.3 10*3/uL (ref 0.0–0.5)
Eosinophils Relative: 4 %
HCT: 42.6 % (ref 36.0–46.0)
Hemoglobin: 13.9 g/dL (ref 12.0–15.0)
Immature Granulocytes: 0 %
Lymphocytes Relative: 29 %
Lymphs Abs: 2.3 10*3/uL (ref 0.7–4.0)
MCH: 28.9 pg (ref 26.0–34.0)
MCHC: 32.6 g/dL (ref 30.0–36.0)
MCV: 88.6 fL (ref 80.0–100.0)
Monocytes Absolute: 0.6 10*3/uL (ref 0.1–1.0)
Monocytes Relative: 8 %
Neutro Abs: 4.7 10*3/uL (ref 1.7–7.7)
Neutrophils Relative %: 59 %
Platelets: 269 10*3/uL (ref 150–400)
RBC: 4.81 MIL/uL (ref 3.87–5.11)
RDW: 13.6 % (ref 11.5–15.5)
WBC: 8.1 10*3/uL (ref 4.0–10.5)
nRBC: 0 % (ref 0.0–0.2)

## 2022-03-04 LAB — BASIC METABOLIC PANEL
Anion gap: 10 (ref 5–15)
BUN: 16 mg/dL (ref 8–23)
CO2: 26 mmol/L (ref 22–32)
Calcium: 9.5 mg/dL (ref 8.9–10.3)
Chloride: 104 mmol/L (ref 98–111)
Creatinine, Ser: 0.9 mg/dL (ref 0.44–1.00)
GFR, Estimated: >60 mL/min (ref >60)
Glucose, Bld: 117 mg/dL — ABNORMAL HIGH (ref 70–99)
Potassium: 3.4 mmol/L — ABNORMAL LOW (ref 3.5–5.1)
Sodium: 140 mmol/L (ref 135–145)

## 2022-03-04 NOTE — ED Provider Notes (Signed)
[facility] EMERGENCY DEPT Provider Note   CSN: [id number] Arrival date & time: 03/04/22  0255     History  Chief Complaint  Patient presents with   Back Pain    Destiny Beasley is a 66 y.o. female.  HPI     This is a 66 year old female who presents with lower abdominal pain.  Patient reports onset of symptoms last night.  She states that the onset was acute.  It was in her left lower abdomen and radiated to her left flank and into her left thigh.  She reports nausea without vomiting.  She has not noted any hematuria or dysuria.  No history of kidney stones.  Currently she states she feels okay but that the pain was much more intense upon her arrival.  She denies any fevers.  She has not had any diarrhea or constipation.  Home Medications Prior to Admission medications   Medication Sig Start Date End Date Taking? Authorizing Provider  ADVAIR [facility] 115-21 MCG/ACT inhaler as needed. 12/14/20   [provider]  albuterol (VENTOLIN HFA) 108 (90 Base) MCG/ACT inhaler SMARTSIG:1-2 Puff(s) By Mouth Every 4 Hours PRN 12/14/20   [provider]  ALPRAZolam (XANAX) 0.25 MG tablet Take 0.25 mg by mouth as needed. 12/09/20   [provider]  aspirin EC 81 MG tablet Take 1 tablet (81 mg total) by mouth daily. 01/01/19   Richardson Dopp T, PA-C  Coenzyme Q10 100 MG capsule Take 100 mg by mouth daily.    [provider]  Continuous Blood Gluc Sensor (FREESTYLE LIBRE 3 SENSOR) MISC APPLY 1 SENSOR TO UPPER ARM  EVERY 14 DAYS FOR CONTINUOUS  GLUCOSE MONITORING 10/05/21   Elayne Snare, MD  Cyanocobalamin (VITAMIN B-12 PO) Take by mouth daily.    [provider]  furosemide (LASIX) 40 MG tablet Take 1 tablet (40 mg total) by mouth daily. 12/07/21   Loel Dubonnet, NP  glucose blood (FREESTYLE TEST STRIPS) test strip Use as instructed 06/10/20   Elayne Snare, MD  JARDIANCE 10 MG TABS tablet Take 10 mg by mouth daily. 11/21/21   [provider]   LANTUS SOLOSTAR 100 UNIT/ML Solostar Pen INJECT SUBCUTANEOUSLY 70  UNITS DAILY Patient taking differently: Inject 64 Units into the skin daily. 10/11/21   Elayne Snare, MD  metoprolol succinate (TOPROL-XL) 50 MG 24 hr tablet Take 1 tablet (50 mg total) by mouth daily. Take with or immediately following a meal. 12/09/21   Loel Dubonnet, NP  nitroGLYCERIN (NITROSTAT) 0.4 MG SL tablet DISSOLVE ONE TABLET UNDER TONGUE AS NEEDED FOR CHEST PAIN 05/18/20   Sherren Mocha, MD  ondansetron (ZOFRAN-ODT) 4 MG disintegrating tablet ondansetron 4 mg disintegrating tablet  TAKE 1 TABLET BY MOUTH EVERY 4 HOURS AS NEEDED FOR NAUSEA    [provider]  sacubitril-valsartan (ENTRESTO) 49-51 MG Take 1 tablet by mouth 2 (two) times daily. 11/29/21   Loel Dubonnet, NP  tirzepatide [facility]) 10 MG/0.5ML Pen Inject 10 mg into the skin once a week.    [provider]  valACYclovir (VALTREX) 1000 MG tablet Take 1,000 mg by mouth 2 (two) times daily as needed (infection). 05/02/20   [provider]  venlafaxine (EFFEXOR) 75 MG tablet Take 225 mg by mouth daily.    [provider]  VITAMIN D PO Take by mouth daily.    [provider]      Allergies    Hydrocodone, Invokana [canagliflozin], Amlodipine, Carvedilol, Chlorthalidone, Doxazosin, Evolocumab, Hydralazine, Imdur [isosorbide  nitrate], Jardiance [empagliflozin], Rosuvastatin, Simvastatin, Spironolactone, Atorvastatin, and Compazine [prochlorperazine]    Review of Systems   Review of Systems  Constitutional:  Negative for fever.  Respiratory:  Negative for shortness of breath.   Cardiovascular:  Negative for chest pain.  Genitourinary:  Positive for flank pain. Negative for dysuria and hematuria.  All other systems reviewed and are negative.   Physical Exam Updated Vital Signs BP (!) 159/78   Pulse 91   Temp 98.5 F (36.9 C) (Oral)   Resp 20   Ht 1.829 m (6')   Wt 112.5 kg   SpO2 96%   BMI 33.63 kg/m   Physical Exam Vitals and nursing note reviewed.  Constitutional:      Appearance: She is well-developed. She is obese. She is not ill-appearing.  HENT:     Head: Normocephalic and atraumatic.  Eyes:     Pupils: Pupils are equal, round, and reactive to light.  Cardiovascular:     Rate and Rhythm: Normal rate and regular rhythm.  Pulmonary:     Effort: Pulmonary effort is normal. No respiratory distress.  Abdominal:     Palpations: Abdomen is soft.     Tenderness: There is abdominal tenderness. There is no right CVA tenderness or left CVA tenderness.  Musculoskeletal:     Cervical back: Neck supple.  Skin:    General: Skin is warm and dry.  Neurological:     Mental Status: She is alert and oriented to person, place, and time.  Psychiatric:        Mood and Affect: Mood normal.     ED Results / Procedures / Treatments   Labs (all labs ordered are listed, but only abnormal results are displayed) Labs Reviewed  URINALYSIS, ROUTINE W REFLEX MICROSCOPIC - Abnormal; Notable for the following components:      Result Value   Hgb urine dipstick TRACE (*)    All other components within normal limits  BASIC METABOLIC PANEL - Abnormal; Notable for the following components:   Potassium 3.4 (*)    Glucose, Bld 117 (*)    All other components within normal limits  CBC WITH DIFFERENTIAL/PLATELET    EKG None  Radiology CT Renal Stone Study  Result Date: 03/04/2022 CLINICAL DATA:  Low back and abdominal pain radiating into the left leg. EXAM: CT ABDOMEN AND PELVIS WITHOUT CONTRAST TECHNIQUE: Multidetector CT imaging of the abdomen and pelvis was performed following the standard protocol without IV contrast. RADIATION DOSE REDUCTION: This exam was performed according to the departmental dose-optimization program which includes automated exposure control, adjustment of the mA and/or kV according to patient size and/or use of iterative reconstruction technique. COMPARISON:  None Available.  FINDINGS: Lower chest: Unremarkable. Hepatobiliary: No suspicious focal abnormality in the liver on this study without intravenous contrast. There is no evidence for gallstones, gallbladder wall thickening, or pericholecystic fluid. No intrahepatic or extrahepatic biliary dilation. Pancreas: No focal mass lesion. No dilatation of the main duct. No intraparenchymal cyst. No peripancreatic edema. Spleen: No splenomegaly. No focal mass lesion. Adrenals/Urinary Tract: No adrenal nodule or mass. No stones are seen in either kidney or ureter. No bladder stones. No secondary changes in either kidney or ureter. Stomach/Bowel: Stomach is unremarkable. No gastric wall thickening. No evidence of outlet obstruction. Duodenum is normally positioned as is the ligament of Treitz. No small bowel wall thickening. No small bowel dilatation. The terminal ileum is normal. The appendix is normal. No gross colonic mass. No colonic wall thickening. Diverticular changes are  noted in the left colon without evidence of diverticulitis. Vascular/Lymphatic: There is mild atherosclerotic calcification of the abdominal aorta without aneurysm. There is no gastrohepatic or hepatoduodenal ligament lymphadenopathy. No retroperitoneal or mesenteric lymphadenopathy. No pelvic sidewall lymphadenopathy. Reproductive: Hysterectomy.  There is no adnexal mass. Other: No intraperitoneal free fluid. Musculoskeletal: No worrisome lytic or sclerotic osseous abnormality. IMPRESSION: 1. No acute findings in the abdomen or pelvis. Specifically, no findings to explain the patient's history of back pain. 2. Left colonic diverticulosis without diverticulitis. 3.  Aortic Atherosclerosis (ICD10-I70.0). Electronically Signed   By: Misty Stanley M.D.   On: 03/04/2022 05:27    Procedures Procedures    Medications Ordered in ED Medications - No data to display  ED Course/ Medical Decision Making/ A&P                           Medical Decision Making Amount  and/or Complexity of Data Reviewed Labs: ordered. Radiology: ordered.   This patient presents to the ED for concern of abdominal and flank pain, this involves an extensive number of treatment options, and is a complaint that carries with it a high risk of complications and morbidity.  I considered the following differential and admission for this acute, potentially life threatening condition.  The differential diagnosis includes kidney stone, UTI, diverticulitis  MDM:    This is a 66 year old female who presents with left lower abdominal pain.  She is nontoxic and vital signs are reassuring.  She reports the pain radiates into her flank and thigh.  It has subsided.  It sounds like pain came on acutely.  This is highly suggestive of potentially kidney stone.  Urinalysis has trace hemoglobin.  Not consistent with UTI.  Patient is declining pain medication. No significant metabolic derangements.  Kidney function preserved.  No leukocytosis.  CT stone study does not show any acute abdominal or pelvic pathology.  Patient could have passed a stone prior to arrival.  On recheck, she remains comfortable.   Discussed that she could have passed a stone prior to arrival.  Otherwise her work-up is reassuring.  Will discharge home.  Recommend Tylenol or ibuprofen for pain.  Make sure that she stays hydrated.  (Labs, imaging, consults)  Labs: I Ordered, and personally interpreted labs.  The pertinent results include: CBC, BMP, urinalysis  Imaging Studies ordered: I ordered imaging studies including stone study I independently visualized and interpreted imaging. I agree with the radiologist interpretation  Additional history obtained from family at [facility].  External records from outside source obtained and reviewed including prior evaluations  Cardiac Monitoring: The patient was maintained on a cardiac monitor.  I personally viewed and interpreted the cardiac monitored which showed an underlying rhythm of:  NSR  Reevaluation: After the interventions noted above, I reevaluated the patient and found that they have :improved  Social Determinants of Health:  lives independently  Disposition: Discharge  Co morbidities that complicate the patient evaluation  Past Medical History:  Diagnosis Date   Anemia    Anxiety    CAD in native artery    a. Inf STEMI 09/5679 complicated by cardiogenic shock/complete heart block s/p emergent cath showing culprit large dominant LCx s/p aspiration thrombectomy and DES, EF 50% by cath, 40-45% by echo.   Chronic systolic heart failure ([place]) 06/25/2017   Echo 11/19: mild LVH, EF 40-45, inf-lat HK, Gr 1 DD, trivial MR   Complete heart block, transient ([place])    a. 06/2017 in  setting of acute inf MI -> resolved after PCI.   COVID-19    Diabetes mellitus    Goiter    History of radiation therapy 06/14/2016 - 07/19/2016   Right Breast 50 Gy 25 fractions   Hypercholesteremia    Hypertension    Ischemic cardiomyopathy    a. EF 50% by cath and 40-45% by echo 06/2017.   Leg pain    ABIs 07/2019: Normal (R 1.27; L 1.25)   Malignant neoplasm of upper-outer quadrant of right female breast ([place]) 02/24/2016   Meningioma [facility])    Nuclear stress test    Nuclear stress test 10/19: EF 35, inferolateral, apical scar; inferior, apical inferior infarct with mild peri-infarct ischemia; high risk   Obesity    Personal history of chemotherapy    Personal history of radiation therapy    Pituitary tumor    Seasonal asthma    Sleep apnea    does not use every night   Stroke [facility])    TIA - Dec 1 st, 2017     Medicines No orders of the defined types were placed in this encounter.   I have reviewed the patients home medicines and have made adjustments as needed  Problem List / ED Course: Problem List Items Addressed This Visit   None Visit Diagnoses     Left flank pain    -  Primary                   Final Clinical Impression(s) / ED Diagnoses Final  diagnoses:  Left flank pain    Rx / DC Orders ED Discharge Orders     None         Merryl Hacker, MD 03/04/22 [phone]

## 2022-03-04 NOTE — ED Triage Notes (Signed)
Lower back/abdo  pain radiates into left leg. Constant since 7pm. Uncomfortable and tearful in triage

## 2022-03-04 NOTE — Discharge Instructions (Signed)
Your seen today for flank pain.  Your work-up is reassuring.  You could have had a kidney stone that passed prior to arrival.  Take Tylenol or ibuprofen as needed for pain.

## 2022-03-30 ENCOUNTER — Other Ambulatory Visit (HOSPITAL_BASED_OUTPATIENT_CLINIC_OR_DEPARTMENT_OTHER): Payer: Self-pay | Admitting: Family

## 2022-03-31 NOTE — Telephone Encounter (Signed)
Rx request sent to pharmacy.  

## 2022-05-17 ENCOUNTER — Other Ambulatory Visit: Payer: Self-pay | Admitting: Physician Assistant

## 2022-05-30 ENCOUNTER — Other Ambulatory Visit (HOSPITAL_COMMUNITY): Payer: Self-pay

## 2022-06-12 ENCOUNTER — Telehealth (HOSPITAL_COMMUNITY): Payer: Self-pay | Admitting: Professional

## 2022-06-24 ENCOUNTER — Other Ambulatory Visit: Payer: Self-pay | Admitting: Cardiovascular Disease

## 2022-06-24 DIAGNOSIS — E785 Hyperlipidemia, unspecified: Secondary | ICD-10-CM

## 2022-06-24 DIAGNOSIS — I25110 Atherosclerotic heart disease of native coronary artery with unstable angina pectoris: Secondary | ICD-10-CM

## 2022-07-05 ENCOUNTER — Encounter (HOSPITAL_BASED_OUTPATIENT_CLINIC_OR_DEPARTMENT_OTHER): Payer: Self-pay

## 2022-07-05 DIAGNOSIS — E785 Hyperlipidemia, unspecified: Secondary | ICD-10-CM

## 2022-07-05 DIAGNOSIS — I25110 Atherosclerotic heart disease of native coronary artery with unstable angina pectoris: Secondary | ICD-10-CM

## 2022-07-05 MED ORDER — PRALUENT 75 MG/ML ~~LOC~~ SOAJ: 75.0000 mg | SUBCUTANEOUS | 3 refills | Status: DC

## 2022-07-14 MED ORDER — PRALUENT 75 MG/ML ~~LOC~~ SOAJ: 75.0000 mg | SUBCUTANEOUS | 3 refills | Status: DC

## 2022-07-14 NOTE — Addendum Note (Signed)
Addended by: Gerald Stabs on: 07/14/2022 02:25 PM   Modules accepted: Orders

## 2022-07-17 NOTE — Telephone Encounter (Signed)
Please review

## 2022-07-18 ENCOUNTER — Other Ambulatory Visit (HOSPITAL_COMMUNITY): Payer: Self-pay

## 2022-07-18 NOTE — Telephone Encounter (Signed)
Please advise 

## 2022-07-18 NOTE — Telephone Encounter (Signed)
  PER TEST CLAIM NO PA IS NEEDED. PATIENT SHOULD BE ABLE TO PICK UP. COST IS A BIT HIGH HOWEVER. THIS IS ON A 1 MONTH SUPPLY.

## 2022-07-19 ENCOUNTER — Telehealth: Payer: Self-pay

## 2022-07-19 ENCOUNTER — Other Ambulatory Visit (HOSPITAL_COMMUNITY): Payer: Self-pay

## 2022-07-19 MED ORDER — REPATHA SURECLICK 140 MG/ML ~~LOC~~ SOAJ: 140.0000 mg | SUBCUTANEOUS | 6 refills | Status: DC

## 2022-07-19 NOTE — Telephone Encounter (Signed)
She has prior intolerance to Repatha - I see it is listed as 'alt option'. Is this something that doing a tier exception will adjust it to a lower price? Or need to utilize [place] or patient assistance?  Loel Dubonnet, NP

## 2022-07-19 NOTE — Addendum Note (Signed)
Addended by: Loel Dubonnet on: 07/19/2022 09:58 AM   Modules accepted: Orders

## 2022-07-20 ENCOUNTER — Other Ambulatory Visit (HOSPITAL_COMMUNITY): Payer: Self-pay

## 2022-07-20 NOTE — Telephone Encounter (Signed)
Would you like [place] to proceed with an appeal for this medication?

## 2022-07-20 NOTE — Telephone Encounter (Signed)
Hi Shaniqua,  It looks like on the prior auth we need to mark that it is NOT being used in combination with another PCSK9i. We have discontinued Praluent if there was any confusion. This should change their determination to covered.  Best,  Loel Dubonnet, NP

## 2022-07-20 NOTE — Telephone Encounter (Addendum)
Destiny Beasley,                     I just wanted to provide an update for you on your submitted p/a that just came through.    An appeal would be needed. Please see full denial in cmm  [id number]

## 2022-07-21 NOTE — Telephone Encounter (Signed)
Pharmacy Patient Advocate Encounter   Received notification from that an appeal is needed for REPATHA 140 MG.    Appeal submitted to plan on 07/21/22 Key [id number] Status is pending  Karie Soda, [place] Patient Advocate Specialist Direct Number: [phone] Fax: [phone]

## 2022-07-31 MED ORDER — REPATHA SURECLICK 140 MG/ML ~~LOC~~ SOAJ: 140.0000 mg | SUBCUTANEOUS | 6 refills | Status: DC

## 2022-07-31 NOTE — Addendum Note (Signed)
Addended by: Marlene Lard on: 07/31/2022 08:28 AM   Modules accepted: Orders

## 2022-07-31 NOTE — Telephone Encounter (Signed)
Okay to Rx Repatha 140mg  q14 days. Prior Berkley Harvey was previously completed - hopefully we will not to complete again.   Alver Sorrow, NP

## 2022-07-31 NOTE — Telephone Encounter (Signed)
Please advise 

## 2022-08-02 ENCOUNTER — Ambulatory Visit (HOSPITAL_BASED_OUTPATIENT_CLINIC_OR_DEPARTMENT_OTHER): Payer: 59 | Admitting: Cardiovascular Disease

## 2022-08-09 ENCOUNTER — Telehealth: Payer: Self-pay

## 2022-08-09 ENCOUNTER — Other Ambulatory Visit (HOSPITAL_COMMUNITY): Payer: Self-pay

## 2022-08-09 NOTE — Telephone Encounter (Signed)
Pharmacy Patient Advocate Encounter  Per test claim no PA is needed at this time.   Pharmacy can fill 1 month supply again on 08/14/22.  Haze Rushing, CPhT Pharmacy Patient Advocate Specialist Direct Number: [phone] Fax: [phone]

## 2022-08-17 ENCOUNTER — Other Ambulatory Visit: Payer: Self-pay | Admitting: Neurosurgery

## 2022-08-17 DIAGNOSIS — D320 Benign neoplasm of cerebral meninges: Secondary | ICD-10-CM

## 2022-08-22 ENCOUNTER — Ambulatory Visit (HOSPITAL_BASED_OUTPATIENT_CLINIC_OR_DEPARTMENT_OTHER): Payer: 59 | Admitting: Cardiovascular Disease

## 2022-08-30 ENCOUNTER — Encounter (HOSPITAL_BASED_OUTPATIENT_CLINIC_OR_DEPARTMENT_OTHER): Payer: Self-pay

## 2022-08-30 DIAGNOSIS — I10 Essential (primary) hypertension: Secondary | ICD-10-CM

## 2022-08-30 NOTE — Telephone Encounter (Signed)
Okay to order BMP/BNP?

## 2022-08-30 NOTE — Telephone Encounter (Signed)
Of note patient seen 11/2021 recommended for 1 month follow up. Has not been completed. Follow up already scheduled for later this month (09/21/22).   Recommend elevation, low salt diet, restrict to <2L fluid restriction.   May increase Lasix to BID x 2 days (take second dose at lunch time) and thereafter return to QD.  BMP/BNP within the next week.   Alver Sorrow, NP

## 2022-09-05 ENCOUNTER — Other Ambulatory Visit (HOSPITAL_BASED_OUTPATIENT_CLINIC_OR_DEPARTMENT_OTHER): Payer: Self-pay | Admitting: Family

## 2022-09-05 NOTE — Telephone Encounter (Signed)
Rx request sent to pharmacy.  

## 2022-09-11 ENCOUNTER — Other Ambulatory Visit: Payer: Self-pay | Admitting: Endocrinology

## 2022-09-13 ENCOUNTER — Ambulatory Visit
Admission: RE | Admit: 2022-09-13 | Discharge: 2022-09-13 | Disposition: A | Discharge status: Home / Self Care | Payer: 59 | Source: Ambulatory Visit | Attending: Neurosurgery | Admitting: Neurosurgery

## 2022-09-13 DIAGNOSIS — D320 Benign neoplasm of cerebral meninges: Secondary | ICD-10-CM

## 2022-09-13 MED ORDER — GADOPICLENOL 0.5 MMOL/ML IV SOLN
10.0000 mL | Freq: Once | INTRAVENOUS | Status: AC | PRN
  Administered 2022-09-13: 10 mL via INTRAVENOUS

## 2022-09-14 LAB — BRAIN NATRIURETIC PEPTIDE: BNP: 24.4 pg/mL (ref 0.0–100.0)

## 2022-09-14 LAB — BASIC METABOLIC PANEL
BUN/Creatinine Ratio: 18 (ref 12–28)
BUN: 17 mg/dL (ref 8–27)
CO2: 27 mmol/L (ref 20–29)
Calcium: 9.8 mg/dL (ref 8.7–10.3)
Chloride: 102 mmol/L (ref 96–106)
Creatinine, Ser: 0.97 mg/dL (ref 0.57–1.00)
Glucose: 121 mg/dL — ABNORMAL HIGH (ref 70–99)
Potassium: 4.9 mmol/L (ref 3.5–5.2)
Sodium: 145 mmol/L — ABNORMAL HIGH (ref 134–144)
eGFR: 65 mL/min/{1.73_m2} (ref >59)

## 2022-09-21 ENCOUNTER — Ambulatory Visit (HOSPITAL_BASED_OUTPATIENT_CLINIC_OR_DEPARTMENT_OTHER): Payer: 59 | Admitting: Family

## 2022-09-21 ENCOUNTER — Encounter (HOSPITAL_BASED_OUTPATIENT_CLINIC_OR_DEPARTMENT_OTHER): Payer: Self-pay | Admitting: Family

## 2022-09-21 VITALS — BP 156/86 | HR 77 | Ht 72.0 in | Wt 257.0 lb

## 2022-09-21 DIAGNOSIS — I5020 Unspecified systolic (congestive) heart failure: Secondary | ICD-10-CM

## 2022-09-21 DIAGNOSIS — G4733 Obstructive sleep apnea (adult) (pediatric): Secondary | ICD-10-CM | POA: Diagnosis not present

## 2022-09-21 DIAGNOSIS — I10 Essential (primary) hypertension: Secondary | ICD-10-CM | POA: Diagnosis not present

## 2022-09-21 DIAGNOSIS — I5022 Chronic systolic (congestive) heart failure: Secondary | ICD-10-CM

## 2022-09-21 DIAGNOSIS — I25118 Atherosclerotic heart disease of native coronary artery with other forms of angina pectoris: Secondary | ICD-10-CM | POA: Diagnosis not present

## 2022-09-21 DIAGNOSIS — E785 Hyperlipidemia, unspecified: Secondary | ICD-10-CM

## 2022-09-21 MED ORDER — ENTRESTO 97-103 MG PO TABS
1.0000 | ORAL_TABLET | Freq: Two times a day (BID) | ORAL | 1 refills | Status: DC
Start: 2022-09-21 — End: 2022-12-18

## 2022-09-21 NOTE — Progress Notes (Signed)
Advanced Hypertension Assessment:    Date:  09/24/2022   ID:  Destiny Beasley, DOB [birth year], MRN [id number]  PCP:  Daisy Floro, MD  Cardiologist:  Tonny Bollman, MD / Dr. [facility] Nephrologist:  Referring MD: Daisy Floro, MD   CC: Hypertension  History of Present Illness:    Destiny Beasley is a 67 y.o. female with a hx of CAD s/p inferior-lateral STEMI 06/2017 with DES to LCx, HFrEF, ICM, DM2, hyperthyroidism, TIA, OSA, breast cancer, HTN, HLD with statin intolerance, COVID19, pituitary tumor s/p resection, meningioma, anxiety  here to follow up in the [facility].   CAD dates back to inf-lat STEMI 06/2017 with DES to pLCx. Cardiac catheterization 04/2020 with patent LCx stent and pLAD with 40% stenosis. Her LVEF since her STEMI has been 40-45%.    Via telemedicine 08/2020 started on Lasix. Seen 10/08/20 with complaint of hot flashes which she attributed to Carvedilol. As leaving office developed chest pain and transported to ED - symptoms resolved with nitroglycerin and Xanax, Hs-troponin negative x2, d-dimer minimally elevated 0.62 though negative when adjusted for age. Head CT unremarkable.    ED visit 10/19/20 was COVID positive treated with antiviral. Visit 11/01/20, 12/14/20, and 12/28/20 with optimization of antihypertensive regimen.  Seen 01/07/21 and Sherryll Burger increased to 97-103mg  BID. Did not tolerate increased dose and reduce to 49-51mg  BID. 07/2021 she was started on doxazosin. Echo 07/2021 LVEF 45-50%, hypokinesis inferolateral wall, gr1DDtrivial MR. She was referred to [facility].   Established with [facility] 09/2021. Hypertension initially diagnosed in her 30s and has always been difficult to control. Enrolled in [employer]. Referred to [facility]. Metoprolol transitioned to Bisoprolol. She self discontinued after 2 weeks due to muscle pain.    11/03/21 Hydralazine 25mg  BID started by pharmacy team. She discontinued to to  reported chest pain. She was stared on Minoxidil 11/07/21. She self discontinued due to muscle pain, cramps. Seen 12/07/21 and Lasix increased to 40mg  for BP control.    Presents today for follow up. Shares she is getting married June 5th and very excited. Exercising at [employer] - still with difficulties with her right knee. Had fluid drained last week. Blood pressure at home 150s/70-80s. Notes she has been [place] of her Sherryll Burger in the morning. No recent cramps. As low as 130s. Reports no shortness of breath nor dyspnea on exertion. Reports no chest pain, pressure, or tightness. No edema, orthopnea, PND. Reports no palpitations.    Previous antihypertensives: Amlodipine (muscle pain) Coreg (wheeze with 25mg  dose) Chlorthalidone (leg pain) Spironolactone (wheeze/fatigue) Jardiance (hallucinations, yeast infections) - now tolerating 10mg  dose Entresto (intolerant to 97-103mg  dose with cramps, tolerates 49-51mg  dose) Hydralazine (fatigue) Imdur (wheeze) Doxazosin (leg cramps)  Past Medical History:  Diagnosis Date   Anemia    Anxiety    CAD in native artery    a. Inf STEMI 06/2017 complicated by cardiogenic shock/complete heart block s/p emergent cath showing culprit large dominant LCx s/p aspiration thrombectomy and DES, EF 50% by cath, 40-45% by echo.   Chronic systolic heart failure (HCC) 06/25/2017   Echo 11/19: mild LVH, EF 40-45, inf-lat HK, Gr 1 DD, trivial MR   Complete heart block, transient (HCC)    a. 06/2017 in setting of acute inf MI -> resolved after PCI.   COVID-19    Diabetes mellitus    Goiter    History of radiation therapy 06/14/2016 - 07/19/2016   Right Breast 50 Gy 25 fractions  Hypercholesteremia    Hypertension    Ischemic cardiomyopathy    a. EF 50% by cath and 40-45% by echo 06/2017.   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   Meningioma [facility])    Nuclear  stress test    Nuclear stress test 10/19: EF 35, inferolateral, apical scar; inferior, apical inferior infarct with mild peri-infarct ischemia; high risk   Obesity    Personal history of chemotherapy    Personal history of radiation therapy    Pituitary tumor    Seasonal asthma    Sleep apnea    does not use every night   Stroke [facility])    TIA - Dec 1 st, 2017    Past Surgical History:  Procedure Laterality Date   ABDOMINAL HYSTERECTOMY     BRAIN MENINGIOMA EXCISION  2005   BREAST BIOPSY     BREAST LUMPECTOMY Right    2017   BREAST LUMPECTOMY WITH RADIOACTIVE SEED LOCALIZATION Right 04/20/2016   Procedure: RIGHT BREAST LUMPECTOMY WITH RADIOACTIVE SEED LOCALIZATION;  Surgeon: Claud Kelp, MD;  Location: [facility];  Service: General;  Laterality: Right;   BREAST REDUCTION SURGERY Bilateral 10/23/2016   Procedure: BILATERAL BREAST REDUCTION WITH LIPOSUCTION ASSISTANCE;  Surgeon: Louisa Second, MD;  Location: [facility];  Service: Plastics;  Laterality: Bilateral;   CESAREAN SECTION     CORONARY STENT INTERVENTION N/A 06/25/2017   Procedure: CORONARY STENT INTERVENTION;  Surgeon: Tonny Bollman, MD;  Location: [facility] INVASIVE CV LAB;  Service: Cardiovascular;  Laterality: N/A;   CORONARY/GRAFT ACUTE MI REVASCULARIZATION N/A 06/25/2017   Procedure: Coronary/Graft Acute MI Revascularization;  Surgeon: Tonny Bollman, MD;  Location: [facility] INVASIVE CV LAB;  Service: Cardiovascular;  Laterality: N/A;   FOOT SURGERY Bilateral 2016   hammer toe surgery   LEFT HEART CATH AND CORONARY ANGIOGRAPHY N/A 06/25/2017   Procedure: LEFT HEART CATH AND CORONARY ANGIOGRAPHY;  Surgeon: Tonny Bollman, MD;  Location: [facility] INVASIVE CV LAB;  Service: Cardiovascular;  Laterality: N/A;   LEFT HEART CATH AND CORONARY ANGIOGRAPHY N/A 05/13/2020   Procedure: LEFT HEART CATH AND CORONARY ANGIOGRAPHY;  Surgeon: Runell Gess, MD;  Location: [facility] INVASIVE CV LAB;  Service: Cardiovascular;   Laterality: N/A;   MENISCUS REPAIR Left 2015   PITUITARY SURGERY     REDUCTION MAMMAPLASTY     RIGHT HEART CATH N/A 06/25/2017   Procedure: RIGHT HEART CATH;  Surgeon: Tonny Bollman, MD;  Location: [facility] INVASIVE CV LAB;  Service: Cardiovascular;  Laterality: N/A;   THYROID SURGERY      Current Medications: Current Meds  Medication Sig   ADVAIR HFA 115-21 MCG/ACT inhaler as needed.   ALPRAZolam (XANAX) 0.25 MG tablet Take 0.25 mg by mouth as needed.   aspirin EC 81 MG tablet Take 1 tablet (81 mg total) by mouth daily.   Coenzyme Q10 100 MG capsule Take 100 mg by mouth daily.   Continuous Blood Gluc Sensor (FREESTYLE LIBRE 3 SENSOR) MISC APPLY 1 SENSOR TO UPPER ARM  EVERY 14 DAYS FOR CONTINUOUS  GLUCOSE MONITORING   furosemide (LASIX) 40 MG tablet Take 1 tablet (40 mg total) by mouth daily.   glucose blood (FREESTYLE TEST STRIPS) test strip Use as instructed   LANTUS SOLOSTAR 100 UNIT/ML Solostar Pen INJECT SUBCUTANEOUSLY 70  UNITS DAILY (Patient taking differently: Inject 64 Units into the skin daily.)   metoprolol succinate (TOPROL-XL) 50 MG 24  hr tablet Take 1 tablet (50 mg total) by mouth daily.   MOUNJARO 5 MG/0.5ML Pen Inject 5 mg into the skin once a week.   nitroGLYCERIN (NITROSTAT) 0.4 MG SL tablet DISSOLVE ONE TABLET UNDER TONGUE AS NEEDED FOR CHEST PAIN   ondansetron (ZOFRAN-ODT) 4 MG disintegrating tablet ondansetron 4 mg disintegrating tablet  TAKE 1 TABLET BY MOUTH EVERY 4 HOURS AS NEEDED FOR NAUSEA   PRALUENT 75 MG/ML SOAJ Inject 1 mL into the skin every 14 (fourteen) days.   sacubitril-valsartan (ENTRESTO) 97-103 MG Take 1 tablet by mouth 2 (two) times daily.   valACYclovir (VALTREX) 1000 MG tablet Take 1,000 mg by mouth 2 (two) times daily as needed (infection).   venlafaxine XR (EFFEXOR-XR) 150 MG 24 hr capsule Take 150 mg by mouth daily.   [DISCONTINUED] sacubitril-valsartan (ENTRESTO) 49-51 MG Take 1 tablet by mouth 2 (two) times daily. Please keep your upcoming  appointment for refills.     Allergies:   Hydrocodone, Invokana [canagliflozin], Amlodipine, Carvedilol, Chlorthalidone, Doxazosin, Evolocumab, Hydralazine, Imdur [isosorbide nitrate], Jardiance [empagliflozin], Rosuvastatin, Simvastatin, Spironolactone, Atorvastatin, and Compazine [prochlorperazine]   Social History   Socioeconomic History   Marital status: Single    Spouse name: Not on file   Number of children: Not on file   Years of education: Not on file   Highest education level: Bachelor's degree (e.g., BA, AB, BS)  Occupational History   Occupation: full time  Tobacco Use   Smoking status: Never   Smokeless tobacco: Never  Vaping Use   Vaping Use: Never used  Substance and Sexual Activity   Alcohol use: Yes    Alcohol/week: 1.0 standard drink of alcohol    Types: 1 Glasses of wine per week    Comment: 1 glass monthly   Drug use: No   Sexual activity: Not on file    Comment: no cycles; pt had hyst  Other Topics Concern   Not on file  Social History Narrative   Lives alone   Right Handed   Drinks 1-2 cups caffeine daily   Social Determinants of Health   Financial Resource Strain: Low Risk  (10/04/2021)   Overall Financial Resource Strain (CARDIA)    Difficulty of Paying Living Expenses: Not hard at all  Food Insecurity: No Food Insecurity (10/04/2021)   Hunger Vital Sign    Worried About Running Out of Food in the Last Year: Never true    Ran Out of Food in the Last Year: Never true  Transportation Needs: No Transportation Needs (10/04/2021)   PRAPARE - Administrator, Civil Service (Medical): No    Lack of Transportation (Non-Medical): No  Physical Activity: Inactive (10/04/2021)   Exercise Vital Sign    Days of Exercise per Week: 0 days    Minutes of Exercise per Session: 0 min  Stress: Not on file  Social Connections: Not on file     Family History: The patient's family history includes Alcohol abuse in her brother; Diabetes in her mother;  Heart attack in her mother; Hyperlipidemia in her mother; Hypertension in her mother and sister; Stroke in her maternal uncle.  ROS:   Please see the history of present illness.     All other systems reviewed and are negative.  EKGs/Labs/Other Studies Reviewed:    EKG:  EKG ordered today.  EKG performed today demonstrates NSR 77 bpm with no acute ST/T wave changes.  Echo 08/17/21  1. Hypokinesis of the inferolateral wall with overall mild LV  dysfunction.  2. Left ventricular ejection fraction, by estimation, is 45 to 50%. The  left ventricle has mildly decreased function. The left ventricle  demonstrates regional wall motion abnormalities (see scoring  diagram/findings for description). Left ventricular  diastolic parameters are consistent with Grade I diastolic dysfunction  (impaired relaxation).   3. Right ventricular systolic function is normal. The right ventricular  size is normal.   4. Left atrial size was mildly dilated.   5. The mitral valve is normal in structure. Trivial mitral valve  regurgitation. No evidence of mitral stenosis.   6. The aortic valve is tricuspid. Aortic valve regurgitation is not  visualized. Aortic valve sclerosis is present, with no evidence of aortic  valve stenosis.   Comparison(s): EF 40%,mild hypokinesis of the left ventricular, mid-apical  inferior wall and inferolateral wall, trivial AI.  Echocardiogram 05/13/20 EF 40-45, inf and inf-lat HK, normal RVSF, trivial MR, trivial AI   Cardiac catheterization 05/13/20 LAD prox 40 LCx stent patent EF 45-50    ABIs 07/2019 Normal    Echocardiogram 09/05/2018 EF 40-45, inf-lat AK, mild LVH, Gr 1 DD, normal RVSF   Echo 03/07/18 Mild LVH, EF 40-45, GLS -11%, inf-lat HK, Gr 1 DD, trivial MR   Nuclear stress test 02/12/18 EF 35, inferolateral, apical scar; inferior, apical inferior infarct with mild peri-infarct ischemia; high risk   Echo 06/26/17 EF 40-45, inferolateral hypokinesis, mild MR    Cardiac catheterization 06/25/17 LM luminal irregularities LAD luminal irregularities, proximal 30 LCx proximal 100 RCA irregularities EF 45-50, inferolateral akinesis PCI: 4 x 20 mm Synergy DES to the proximal LCx   Echo 03/25/16 Moderate LVH, EF 60-65, normal wall motion   Carotid US 12/17 Bilateral ICA 1-39  Recent Labs: 03/04/2022: Hemoglobin 13.9; Platelets 269 09/13/2022: BNP 24.4; BUN 17; Creatinine, Ser 0.97; Potassium 4.9; Sodium 145   Recent Lipid Panel    Component Value Date/Time   CHOL 120 10/06/2020 0743   TRIG 63 10/06/2020 0743   HDL 54 10/06/2020 0743   CHOLHDL 2.2 10/06/2020 0743   CHOLHDL 2 08/12/2018 0944   VLDL 19.2 08/12/2018 0944   LDLCALC 52 10/06/2020 0743   LDLDIRECT 67 07/15/2020 1609   LDLDIRECT 87.0 06/04/2019 0835    Physical Exam:   VS:  BP (!) 156/86   Pulse 77   Ht 6' (1.829 m)   Wt 257 lb (116.6 kg)   BMI 34.86 kg/m  , BMI Body mass index is 34.86 kg/m. GENERAL:  Well appearing HEENT: Pupils equal round and reactive, fundi not visualized, oral mucosa unremarkable NECK:  No jugular venous distention, waveform within normal limits, carotid upstroke brisk and symmetric, no bruits, no thyromegaly LYMPHATICS:  No cervical adenopathy LUNGS:  Clear to auscultation bilaterally HEART:  RRR.  PMI not displaced or sustained,S1 and S2 within normal limits, no S3, no S4, no clicks, no rubs, no murmurs ABD:  Flat, positive bowel sounds normal in frequency in pitch, no bruits, no rebound, no guarding, no midline pulsatile mass, no hepatomegaly, no splenomegaly EXT:  2 plus pulses throughout, no edema, no cyanosis no clubbing SKIN:  No rashes no nodules NEURO:  Cranial nerves II through XII grossly intact, motor grossly intact throughout PSYCH:  Cognitively intact, oriented to person place and time   ASSESSMENT/PLAN:    CAD/HLD, LDL goal less than 70- Stable with no anginal symptoms. No indication for ischemic evaluation.  Prior intolerance to  atorvastatin, rosuvastatin, simvastatin with myalgias.  GDMT includes Praluent, Aspirin, Metoprolol. Heart healthy diet and regular cardiovascular  exercise encouraged.    HFrEF / ICM - 05/13/20 LVEF 40-45%.  07/2021 LVEF 45-50%. GDMT includes Toprol (previous intolerance to Carvedilol), Entresto, Lasix, Jardiance. Previously did not tolerate Entresot 97-103mg  due to cramps but recently had been taking both of her Entresto 49-51mg  tablet in the morning without recurrent cramping.  Increase Entresto to 97 to 23 mg twice daily.  BMP in 1 to 2 weeks.  Medication adjustment difficult given multiple medication intolerances. Heart healthy diet and regular cardiovascular exercise encouraged.  Educated to report weight gain of 2 lb overnight or 5 lb in 1 week.    HTN - BP not to goal of <130/80. Multiple intolerances listed above.  Adjust Entresto to 97-103 mg twice daily, as above.   Renal duplex to r/o stenosis as last imaged 05/2021 Initiated discussion regarding RDN which she is interested in Discussed to monitor BP at home at least 2 hours after medications and sitting for 5-10 minutes. Heart healthy diet and regular cardiovascular exercise encouraged.     Anxiety - Continue to follow with PCP.    OSA - CPAP compliance encouraged. Endorses wearing regularly.   Screening for Secondary Hypertension:     10/04/2021    9:43 AM  Causes  Drugs/Herbals Screened     - Comments limitis sodium and caffeine.  Uses Aleve bid.  Renovascular HTN Screened     - Comments No renal artery stenosis on CT 05/2021  Sleep Apnea Screened     - Comments uses BiPAP  Thyroid Disease Screened  Hyperaldosteronism Screened     - Comments No adenomas on CT in 2023  Pheochromocytoma Screened     - Comments No adenomas or symptoms.  Cushing's Syndrome N/A  Hyperparathyroidism Screened  Coarctation of the Aorta Screened     - Comments BP symmetric  Compliance Screened    Relevant Labs/Studies:    Latest Ref Rng & Units  09/13/2022   10:27 AM 03/04/2022    5:11 AM 12/21/2021   11:12 AM  Basic Labs  Sodium 134 - 144 mmol/L 145  140  142   Potassium 3.5 - 5.2 mmol/L 4.9  3.4  4.8   Creatinine 0.57 - 1.00 mg/dL 1.61  0.96  0.45        Latest Ref Rng & Units 01/18/2021    8:46 AM 11/30/2020    8:22 AM  Thyroid   TSH 0.35 - 5.50 uIU/mL 1.26  3.57              Latest Ref Rng & Units 11/11/2011    1:14 PM  Cortisol  Cortisol  ug/dL 9.6        07/31/8117    9:24 AM  Renovascular   Renal Artery [place] Completed Yes      Disposition:    FU with PharmD in 2-3 month    Medication Adjustments/Labs and Tests Ordered: Current medicines are reviewed at length with the patient today.  Concerns regarding medicines are outlined above.  Orders Placed This Encounter  Procedures   Basic metabolic panel   EKG 12-Lead   VAS US RENAL ARTERY DUPLEX   Meds ordered this encounter  Medications   sacubitril-valsartan (ENTRESTO) 97-103 MG    Sig: Take 1 tablet by mouth 2 (two) times daily.    Dispense:  180 tablet    Refill:  1    Signed, Alver Sorrow, NP  09/24/2022 1:52 PM    [facility]

## 2022-09-21 NOTE — Patient Instructions (Signed)
Medication Instructions:  STOP Entresto 49-51mg   START Entresto 97-103mg  TWICE daily   Labwork: Your physician recommends that you return for lab work in 1-2 weeks for BMP   Testing/Procedures: Your physician has requested that you have a renal artery duplex. During this test, an ultrasound is used to evaluate blood flow to the kidneys. Allow one hour for this exam. Do not eat after midnight the day before and avoid carbonated beverages. Take your medications as you usually do.    Follow-Up: In 2-3 months with [facility]

## 2022-09-22 ENCOUNTER — Encounter (HOSPITAL_BASED_OUTPATIENT_CLINIC_OR_DEPARTMENT_OTHER): Payer: Self-pay

## 2022-09-24 ENCOUNTER — Encounter (HOSPITAL_BASED_OUTPATIENT_CLINIC_OR_DEPARTMENT_OTHER): Payer: Self-pay | Admitting: Family

## 2022-09-24 ENCOUNTER — Encounter (HOSPITAL_BASED_OUTPATIENT_CLINIC_OR_DEPARTMENT_OTHER): Payer: Self-pay

## 2022-09-25 NOTE — Telephone Encounter (Signed)
Please advise, ok to start new higher dose entresto?

## 2022-09-29 ENCOUNTER — Other Ambulatory Visit: Payer: Self-pay

## 2022-10-05 LAB — BASIC METABOLIC PANEL
BUN/Creatinine Ratio: 19 (ref 12–28)
BUN: 16 mg/dL (ref 8–27)
CO2: 26 mmol/L (ref 20–29)
Calcium: 9.5 mg/dL (ref 8.7–10.3)
Chloride: 105 mmol/L (ref 96–106)
Creatinine, Ser: 0.86 mg/dL (ref 0.57–1.00)
Glucose: 146 mg/dL — ABNORMAL HIGH (ref 70–99)
Potassium: 4.4 mmol/L (ref 3.5–5.2)
Sodium: 142 mmol/L (ref 134–144)
eGFR: 75 mL/min/{1.73_m2} (ref >59)

## 2022-10-10 ENCOUNTER — Telehealth (HOSPITAL_BASED_OUTPATIENT_CLINIC_OR_DEPARTMENT_OTHER): Payer: Self-pay | Admitting: Family

## 2022-10-10 NOTE — Telephone Encounter (Signed)
Left message for patient to call and discuss rescheduling the 10/06/22 cancelled renal doppler appointment

## 2022-10-14 ENCOUNTER — Encounter (HOSPITAL_BASED_OUTPATIENT_CLINIC_OR_DEPARTMENT_OTHER): Payer: Self-pay

## 2022-10-16 ENCOUNTER — Other Ambulatory Visit (HOSPITAL_BASED_OUTPATIENT_CLINIC_OR_DEPARTMENT_OTHER): Payer: Self-pay | Admitting: Family

## 2022-10-16 MED ORDER — PRALUENT 75 MG/ML ~~LOC~~ SOAJ: 1.0000 mL | SUBCUTANEOUS | 3 refills | Status: DC

## 2022-10-17 ENCOUNTER — Encounter (HOSPITAL_BASED_OUTPATIENT_CLINIC_OR_DEPARTMENT_OTHER): Payer: Self-pay

## 2022-10-17 NOTE — Telephone Encounter (Signed)
Please assist patient to reschedule renal dopplers

## 2022-10-24 ENCOUNTER — Encounter (HOSPITAL_BASED_OUTPATIENT_CLINIC_OR_DEPARTMENT_OTHER): Payer: 59

## 2022-10-27 ENCOUNTER — Other Ambulatory Visit: Payer: Self-pay | Admitting: *Deleted

## 2022-10-27 DIAGNOSIS — I10 Essential (primary) hypertension: Secondary | ICD-10-CM

## 2022-11-26 ENCOUNTER — Encounter (HOSPITAL_BASED_OUTPATIENT_CLINIC_OR_DEPARTMENT_OTHER): Payer: Self-pay

## 2022-11-26 NOTE — Telephone Encounter (Signed)
Requesting to reschedule renal duplex.  Destiny Sorrow, NP

## 2022-11-27 ENCOUNTER — Ambulatory Visit (HOSPITAL_BASED_OUTPATIENT_CLINIC_OR_DEPARTMENT_OTHER): Payer: 59

## 2022-11-27 DIAGNOSIS — I10 Essential (primary) hypertension: Secondary | ICD-10-CM | POA: Diagnosis not present

## 2022-11-27 NOTE — Telephone Encounter (Signed)
Please assist patient to reschedule renal

## 2022-11-30 ENCOUNTER — Encounter (HOSPITAL_BASED_OUTPATIENT_CLINIC_OR_DEPARTMENT_OTHER): Payer: Self-pay | Admitting: Family

## 2022-11-30 ENCOUNTER — Ambulatory Visit (INDEPENDENT_AMBULATORY_CARE_PROVIDER_SITE_OTHER): Payer: 59 | Admitting: Family

## 2022-11-30 ENCOUNTER — Other Ambulatory Visit (HOSPITAL_BASED_OUTPATIENT_CLINIC_OR_DEPARTMENT_OTHER): Payer: Self-pay | Admitting: Family

## 2022-11-30 VITALS — BP 144/78 | HR 76 | Ht 72.0 in | Wt 254.0 lb

## 2022-11-30 DIAGNOSIS — G4733 Obstructive sleep apnea (adult) (pediatric): Secondary | ICD-10-CM

## 2022-11-30 DIAGNOSIS — I5022 Chronic systolic (congestive) heart failure: Secondary | ICD-10-CM

## 2022-11-30 DIAGNOSIS — E785 Hyperlipidemia, unspecified: Secondary | ICD-10-CM

## 2022-11-30 DIAGNOSIS — I25118 Atherosclerotic heart disease of native coronary artery with other forms of angina pectoris: Secondary | ICD-10-CM

## 2022-11-30 DIAGNOSIS — I10 Essential (primary) hypertension: Secondary | ICD-10-CM | POA: Diagnosis not present

## 2022-11-30 NOTE — Progress Notes (Signed)
Advanced Hypertension Assessment:    Date:  11/30/2022   ID:  Destiny Beasley, DOB 05-20-1955, MRN [id number]  PCP:  Daisy Floro, MD  Cardiologist:  Tonny Bollman, MD / Dr. [facility] Nephrologist:  Referring MD: Daisy Floro, MD   CC: Hypertension  History of Present Illness:    Destiny Beasley is a 67 y.o. female with a hx of CAD s/p inferior-lateral STEMI 06/2017 with DES to LCx, HFrEF, ICM, DM2, hyperthyroidism, TIA, OSA, breast cancer, HTN, HLD with statin intolerance, COVID19, pituitary tumor s/p resection, meningioma, anxiety  here to follow up in the [facility].   CAD dates back to inf-lat STEMI 06/2017 with DES to pLCx. Cardiac catheterization 04/2020 with patent LCx stent and pLAD with 40% stenosis. Her LVEF since her STEMI has been 40-45%.    Via telemedicine 08/2020 started on Lasix. Seen 10/08/20 with complaint of hot flashes which she attributed to Carvedilol. As leaving office developed chest pain and transported to ED - symptoms resolved with nitroglycerin and Xanax, Hs-troponin negative x2, d-dimer minimally elevated 0.62 though negative when adjusted for age. Head CT unremarkable.    ED visit 10/19/20 was COVID positive treated with antiviral. Visit 11/01/20, 12/14/20, and 12/28/20 with optimization of antihypertensive regimen.  Seen 01/07/21 and Sherryll Burger increased to 97-103mg  BID. Did not tolerate increased dose and reduce to 49-51mg  BID. 07/2021 she was started on doxazosin. Echo 07/2021 LVEF 45-50%, hypokinesis inferolateral wall, gr1DDtrivial MR. She was referred to [facility].   Established with [facility] 09/2021. Hypertension initially diagnosed in her 30s and has always been difficult to control. Enrolled in [employer]. Referred to [facility]. Metoprolol transitioned to Bisoprolol. She self discontinued after 2 weeks due to muscle pain.    11/03/21 Hydralazine 25mg  BID started by pharmacy team. She discontinued to to  reported chest pain. She was stared on Minoxidil 11/07/21. She self discontinued due to muscle pain, cramps. Seen 12/07/21 and Lasix increased to 40mg  for BP control.   At last visit 09/21/22 Entresto increased on 97-1 03 milligrams twice daily.  Renal duplex ordered and performed 11/27/2022 with no evidence of RAS.   Presents today for follow up. Exercising at [employer]. Blood pressure at home 130-153/70-80s. Reports no shortness of breath nor dyspnea on exertion. Reports no chest pain, pressure, or tightness. No edema, orthopnea, PND. Reports no palpitations.  She is retiring in September and believes her blood pressure will be better controlled after retiring.  Excited for her upcoming trip to [facility] for 3 weeks.   Previous antihypertensives: Amlodipine (muscle pain) Coreg (wheeze with 25mg  dose) Chlorthalidone (leg pain) Spironolactone (wheeze/fatigue) Jardiance (hallucinations, yeast infections) - now tolerating 10mg  dose Entresto (intolerant to 97-103mg  dose with cramps, tolerates 49-51mg  dose) Hydralazine (fatigue) Imdur (wheeze) Doxazosin (leg cramps)  Past Medical History:  Diagnosis Date   Anemia    Anxiety    CAD in native artery    a. Inf STEMI 06/2017 complicated by cardiogenic shock/complete heart block s/p emergent cath showing culprit large dominant LCx s/p aspiration thrombectomy and DES, EF 50% by cath, 40-45% by echo.   Chronic systolic heart failure (HCC) 06/25/2017   Echo 11/19: mild LVH, EF 40-45, inf-lat HK, Gr 1 DD, trivial MR   Complete heart block, transient (HCC)    a. 06/2017 in setting of acute inf MI -> resolved after PCI.   COVID-19    Diabetes mellitus    Goiter    History of radiation therapy 06/14/2016 -  07/19/2016   Right Breast 50 Gy 25 fractions   Hypercholesteremia    Hypertension    Ischemic cardiomyopathy    a. EF 50% by cath and 40-45% by echo 06/2017.   Leg pain    ABIs 07/2019: Normal (R 1.27; L 1.25)   Malignant neoplasm of upper-outer  quadrant of right female breast (HCC) 02/24/2016   Meningioma [facility])    Nuclear stress test    Nuclear stress test 10/19: EF 35, inferolateral, apical scar; inferior, apical inferior infarct with mild peri-infarct ischemia; high risk   Obesity    Personal history of chemotherapy    Personal history of radiation therapy    Pituitary tumor    Seasonal asthma    Sleep apnea    does not use every night   Stroke [facility])    TIA - Dec 1 st, 2017    Past Surgical History:  Procedure Laterality Date   ABDOMINAL HYSTERECTOMY     BRAIN MENINGIOMA EXCISION  2005   BREAST BIOPSY     BREAST LUMPECTOMY Right    2017   BREAST LUMPECTOMY WITH RADIOACTIVE SEED LOCALIZATION Right 04/20/2016   Procedure: RIGHT BREAST LUMPECTOMY WITH RADIOACTIVE SEED LOCALIZATION;  Surgeon: Claud Kelp, MD;  Location: [facility];  Service: General;  Laterality: Right;   BREAST REDUCTION SURGERY Bilateral 10/23/2016   Procedure: BILATERAL BREAST REDUCTION WITH LIPOSUCTION ASSISTANCE;  Surgeon: Louisa Second, MD;  Location: [facility];  Service: Plastics;  Laterality: Bilateral;   CESAREAN SECTION     CORONARY STENT INTERVENTION N/A 06/25/2017   Procedure: CORONARY STENT INTERVENTION;  Surgeon: Tonny Bollman, MD;  Location[facility] INVASIVE CV LAB;  Service: Cardiovascular;  Laterality: N/A;   CORONARY/GRAFT ACUTE MI REVASCULARIZATION N/A 06/25/2017   Procedure: Coronary/Graft Acute MI Revascularization;  Surgeon: Tonny Bollman, MD;  Location: [facility] INVASIVE CV LAB;  Service: Cardiovascular;  Laterality: N/A;   FOOT SURGERY Bilateral 2016   hammer toe surgery   LEFT HEART CATH AND CORONARY ANGIOGRAPHY N/A 06/25/2017   Procedure: LEFT HEART CATH AND CORONARY ANGIOGRAPHY;  Surgeon: Tonny Bollman, MD;  Location: [facility] INVASIVE CV LAB;  Service: Cardiovascular;  Laterality: N/A;   LEFT HEART CATH AND CORONARY ANGIOGRAPHY N/A 05/13/2020   Procedure: LEFT HEART CATH AND CORONARY ANGIOGRAPHY;  Surgeon:  Runell Gess, MD;  Location: [facility] INVASIVE CV LAB;  Service: Cardiovascular;  Laterality: N/A;   MENISCUS REPAIR Left 2015   PITUITARY SURGERY     REDUCTION MAMMAPLASTY     RIGHT HEART CATH N/A 06/25/2017   Procedure: RIGHT HEART CATH;  Surgeon: Tonny Bollman, MD;  Location: [facility] INVASIVE CV LAB;  Service: Cardiovascular;  Laterality: N/A;   THYROID SURGERY      Current Medications: Current Meds  Medication Sig   ADVAIR HFA 115-21 MCG/ACT inhaler as needed.   ALPRAZolam (XANAX) 0.25 MG tablet Take 0.25 mg by mouth as needed.   aspirin EC 81 MG tablet Take 1 tablet (81 mg total) by mouth daily.   Continuous Blood Gluc Sensor (FREESTYLE LIBRE 3 SENSOR) MISC APPLY 1 SENSOR TO UPPER ARM  EVERY 14 DAYS FOR CONTINUOUS  GLUCOSE MONITORING   furosemide (LASIX) 40 MG tablet Take 1 tablet (40 mg total) by mouth daily.   glucose blood (FREESTYLE TEST STRIPS) test strip Use as instructed   LANTUS SOLOSTAR 100 UNIT/ML Solostar Pen INJECT SUBCUTANEOUSLY 70  UNITS DAILY (Patient taking differently: Inject 64 Units into the skin daily.)   metoprolol succinate (TOPROL-XL) 50 MG 24 hr tablet  Take 1 tablet (50 mg total) by mouth daily.   MOUNJARO 5 MG/0.5ML Pen Inject 5 mg into the skin once a week.   nitroGLYCERIN (NITROSTAT) 0.4 MG SL tablet DISSOLVE ONE TABLET UNDER TONGUE AS NEEDED FOR CHEST PAIN   ondansetron (ZOFRAN-ODT) 4 MG disintegrating tablet ondansetron 4 mg disintegrating tablet  TAKE 1 TABLET BY MOUTH EVERY 4 HOURS AS NEEDED FOR NAUSEA   PRALUENT 75 MG/ML SOAJ Inject 1 mL (75 mg total) into the skin every 14 (fourteen) days.   sacubitril-valsartan (ENTRESTO) 97-103 MG Take 1 tablet by mouth 2 (two) times daily.   valACYclovir (VALTREX) 1000 MG tablet Take 1,000 mg by mouth 2 (two) times daily as needed (infection).   venlafaxine XR (EFFEXOR-XR) 150 MG 24 hr capsule Take 150 mg by mouth daily.     Allergies:   Hydrocodone, Invokana [canagliflozin], Amlodipine, Carvedilol, Chlorthalidone,  Doxazosin, Evolocumab, Hydralazine, Imdur [isosorbide nitrate], Jardiance [empagliflozin], Rosuvastatin, Simvastatin, Spironolactone, Atorvastatin, and Compazine [prochlorperazine]   Social History   Socioeconomic History   Marital status: Single    Spouse name: Not on file   Number of children: Not on file   Years of education: Not on file   Highest education level: Bachelor's degree (e.g., BA, AB, BS)  Occupational History   Occupation: full time  Tobacco Use   Smoking status: Never   Smokeless tobacco: Never  Vaping Use   Vaping status: Never Used  Substance and Sexual Activity   Alcohol use: Yes    Alcohol/week: 1.0 standard drink of alcohol    Types: 1 Glasses of wine per week    Comment: 1 glass monthly   Drug use: No   Sexual activity: Not on file    Comment: no cycles; pt had hyst  Other Topics Concern   Not on file  Social History Narrative   Lives alone   Right Handed   Drinks 1-2 cups caffeine daily   Social Determinants of Health   Financial Resource Strain: Low Risk  (10/04/2021)   Overall Financial Resource Strain (CARDIA)    Difficulty of Paying Living Expenses: Not hard at all  Food Insecurity: No Food Insecurity (10/04/2021)   Hunger Vital Sign    Worried About Running Out of Food in the Last Year: Never true    Ran Out of Food in the Last Year: Never true  Transportation Needs: No Transportation Needs (10/04/2021)   PRAPARE - Administrator, Civil Service (Medical): No    Lack of Transportation (Non-Medical): No  Physical Activity: Inactive (10/04/2021)   Exercise Vital Sign    Days of Exercise per Week: 0 days    Minutes of Exercise per Session: 0 min  Stress: Not on file  Social Connections: Not on file     Family History: The patient's family history includes Alcohol abuse in her brother; Diabetes in her mother; Heart attack in her mother; Hyperlipidemia in her mother; Hypertension in her mother and sister; Stroke in her maternal  uncle.  ROS:   Please see the history of present illness.     All other systems reviewed and are negative.  EKGs/Labs/Other Studies Reviewed:    EKG:  EKG ordered today.  EKG performed today demonstrates NSR 77 bpm with no acute ST/T wave changes.  Echo 08/17/21  1. Hypokinesis of the inferolateral wall with overall mild LV  dysfunction.   2. Left ventricular ejection fraction, by estimation, is 45 to 50%. The  left ventricle has mildly decreased function. The left  ventricle  demonstrates regional wall motion abnormalities (see scoring  diagram/findings for description). Left ventricular  diastolic parameters are consistent with Grade I diastolic dysfunction  (impaired relaxation).   3. Right ventricular systolic function is normal. The right ventricular  size is normal.   4. Left atrial size was mildly dilated.   5. The mitral valve is normal in structure. Trivial mitral valve  regurgitation. No evidence of mitral stenosis.   6. The aortic valve is tricuspid. Aortic valve regurgitation is not  visualized. Aortic valve sclerosis is present, with no evidence of aortic  valve stenosis.   Comparison(s): EF 40%,mild hypokinesis of the left ventricular, mid-apical  inferior wall and inferolateral wall, trivial AI.  Echocardiogram 05/13/20 EF 40-45, inf and inf-lat HK, normal RVSF, trivial MR, trivial AI   Cardiac catheterization 05/13/20 LAD prox 40 LCx stent patent EF 45-50    ABIs 07/2019 Normal    Echocardiogram 09/05/2018 EF 40-45, inf-lat AK, mild LVH, Gr 1 DD, normal RVSF   Echo 03/07/18 Mild LVH, EF 40-45, GLS -11%, inf-lat HK, Gr 1 DD, trivial MR   Nuclear stress test 02/12/18 EF 35, inferolateral, apical scar; inferior, apical inferior infarct with mild peri-infarct ischemia; high risk   Echo 06/26/17 EF 40-45, inferolateral hypokinesis, mild MR   Cardiac catheterization 06/25/17 LM luminal irregularities LAD luminal irregularities, proximal 30 LCx proximal 100 RCA  irregularities EF 45-50, inferolateral akinesis PCI: 4 x 20 mm Synergy DES to the proximal LCx   Echo 03/25/16 Moderate LVH, EF 60-65, normal wall motion   Carotid US 12/17 Bilateral ICA 1-39  Recent Labs: 03/04/2022: Hemoglobin 13.9; Platelets 269 09/13/2022: BNP 24.4 10/04/2022: BUN 16; Creatinine, Ser 0.86; Potassium 4.4; Sodium 142   Recent Lipid Panel    Component Value Date/Time   CHOL 120 10/06/2020 0743   TRIG 63 10/06/2020 0743   HDL 54 10/06/2020 0743   CHOLHDL 2.2 10/06/2020 0743   CHOLHDL 2 08/12/2018 0944   VLDL 19.2 08/12/2018 0944   LDLCALC 52 10/06/2020 0743   LDLDIRECT 67 07/15/2020 1609   LDLDIRECT 87.0 06/04/2019 0835    Physical Exam:   VS:  BP (!) 144/78   Pulse 76   Ht 6' (1.829 m)   Wt 254 lb (115.2 kg)   BMI 34.45 kg/m  , BMI Body mass index is 34.45 kg/m. GENERAL:  Well appearing HEENT: Pupils equal round and reactive, fundi not visualized, oral mucosa unremarkable NECK:  No jugular venous distention, waveform within normal limits, carotid upstroke brisk and symmetric, no bruits, no thyromegaly LYMPHATICS:  No cervical adenopathy LUNGS:  Clear to auscultation bilaterally HEART:  RRR.  PMI not displaced or sustained,S1 and S2 within normal limits, no S3, no S4, no clicks, no rubs, no murmurs ABD:  Flat, positive bowel sounds normal in frequency in pitch, no bruits, no rebound, no guarding, no midline pulsatile mass, no hepatomegaly, no splenomegaly EXT:  2 plus pulses throughout, no edema, no cyanosis no clubbing SKIN:  No rashes no nodules NEURO:  Cranial nerves II through XII grossly intact, motor grossly intact throughout PSYCH:  Cognitively intact, oriented to person place and time   ASSESSMENT/PLAN:    CAD/HLD, LDL goal less than 70- Stable with no anginal symptoms. No indication for ischemic evaluation.  Prior intolerance to atorvastatin, rosuvastatin, simvastatin with myalgias.  GDMT includes Praluent, Aspirin, Metoprolol. Heart healthy  diet and regular cardiovascular exercise encouraged.    HFrEF / ICM - 05/13/20 LVEF 40-45%.  07/2021 LVEF 45-50%. GDMT includes Toprol (previous  intolerance to Carvedilol), Entresto, Lasix, Jardiance. Heart healthy diet and regular cardiovascular exercise encouraged.  Educated to report weight gain of 2 lb overnight or 5 lb in 1 week.    HTN - BP not to goal of <130/80. Multiple intolerances listed above.  Present regimen Lasix 40 mg daily, Toprol 50 mg daily, Entresto 97-103 mg twice daily.  Renal duplex 11/2022 with no stenosis.  Wearing CPAP regularly.  Secondary hypertension workup has been otherwise unremarkable. Discussed to monitor BP at home at least 2 hours after medications and sitting for 5-10 minutes. Heart healthy diet and regular cardiovascular exercise encouraged.  Continues to exercise at [employer].  Given multiple prior intolerances she is hesitant regarding further medication changes.  She is hopeful her BP will improve once retired.  Recommend continued home monitoring.  If BP at next follow-up is not at goal less than 130/80 will plan to refer for renal denervation. Offered today but she wishes to wait until after retirement.    Anxiety - Continue to follow with PCP.    OSA - CPAP compliance encouraged. Endorses wearing regularly.   Screening for Secondary Hypertension:     10/04/2021    9:43 AM  Causes  Drugs/Herbals Screened     - Comments limitis sodium and caffeine.  Uses Aleve bid.  Renovascular HTN Screened     - Comments No renal artery stenosis on CT 05/2021  Sleep Apnea Screened     - Comments uses BiPAP  Thyroid Disease Screened  Hyperaldosteronism Screened     - Comments No adenomas on CT in 2023  Pheochromocytoma Screened     - Comments No adenomas or symptoms.  Cushing's Syndrome N/A  Hyperparathyroidism Screened  Coarctation of the Aorta Screened     - Comments BP symmetric  Compliance Screened    Relevant Labs/Studies:    Latest Ref Rng & Units  10/04/2022    4:04 PM 09/13/2022   10:27 AM 03/04/2022    5:11 AM  Basic Labs  Sodium 134 - 144 mmol/L 142  145  140   Potassium 3.5 - 5.2 mmol/L 4.4  4.9  3.4   Creatinine 0.57 - 1.00 mg/dL 1.47  8.29  5.62        Latest Ref Rng & Units 01/18/2021    8:46 AM 11/30/2020    8:22 AM  Thyroid   TSH 0.35 - 5.50 uIU/mL 1.26  3.57              Latest Ref Rng & Units 11/11/2011    1:14 PM  Cortisol  Cortisol  ug/dL 9.6        04/27/863    7:53 AM  Renovascular   Renal Artery [place] Completed Yes      Disposition:    FU with PharmD in 4 months   Medication Adjustments/Labs and Tests Ordered: Current medicines are reviewed at length with the patient today.  Concerns regarding medicines are outlined above.  No orders of the defined types were placed in this encounter.  No orders of the defined types were placed in this encounter.   Signed, Alver Sorrow, NP  11/30/2022 4:32 PM    [facility]

## 2022-11-30 NOTE — Patient Instructions (Signed)
Medication Instructions:  Continue your current medications.  *If you need a refill on your cardiac medications before your next appointment, please call your pharmacy*  Follow-Up: At [facility], you and your health needs are our priority.  As part of our continuing mission to provide you with exceptional heart care, we have created designated Provider Care Teams.  These Care Teams include your primary Cardiologist (physician) and Advanced Practice Providers (APPs -  Physician Assistants and Nurse Practitioners) who all work together to provide you with the care you need, when you need it.  We recommend signing up for the patient portal called "MyChart".  Sign up information is provided on this After Visit Summary.  MyChart is used to connect with patients for Virtual Visits (Telemedicine).  Patients are able to view lab/test results, encounter notes, upcoming appointments, etc.  Non-urgent messages can be sent to your provider as well.   To learn more about what you can do with MyChart, go to [email or web address].    Your next appointment:   In November as scheduled  Other Instructions  Tips to Measure your Blood Pressure Correctly  Here's what you can do to ensure a correct reading:  Don't drink a caffeinated beverage or smoke during the 30 minutes before the test.  Sit quietly for five minutes before the test begins.  During the measurement, sit in a chair with your feet on the floor and your arm supported so your elbow is at about heart level.  The inflatable part of the cuff should completely cover at least 80% of your upper arm, and the cuff should be placed on bare skin, not over a shirt.  Don't talk during the measurement.   Blood pressure categories  Blood pressure category SYSTOLIC (upper number)  DIASTOLIC (lower number)  Normal Less than 120 mm Hg and Less than 80 mm Hg  Elevated 120-129 mm Hg and Less than 80 mm Hg  High blood pressure: Stage 1 hypertension  130-139 mm Hg or 80-89 mm Hg  High blood pressure: Stage 2 hypertension 140 mm Hg or higher or 90 mm Hg or higher  Hypertensive crisis (consult your doctor immediately) Higher than 180 mm Hg and/or Higher than 120 mm Hg  Source: American Heart Association and American Stroke Association. For more on getting your blood pressure under control, buy Controlling Your Blood Pressure, a Special Health Report from [facility].   Blood Pressure Log   Date   Time  Blood Pressure  Example: Nov 1 9 AM 124/78

## 2022-12-05 ENCOUNTER — Other Ambulatory Visit (HOSPITAL_BASED_OUTPATIENT_CLINIC_OR_DEPARTMENT_OTHER): Payer: Self-pay | Admitting: Family

## 2022-12-13 ENCOUNTER — Encounter (HOSPITAL_BASED_OUTPATIENT_CLINIC_OR_DEPARTMENT_OTHER): Payer: Self-pay

## 2022-12-13 DIAGNOSIS — I10 Essential (primary) hypertension: Secondary | ICD-10-CM

## 2022-12-13 DIAGNOSIS — I5022 Chronic systolic (congestive) heart failure: Secondary | ICD-10-CM

## 2022-12-14 NOTE — Telephone Encounter (Signed)
OK  to send in reduced dose?

## 2022-12-15 ENCOUNTER — Other Ambulatory Visit (HOSPITAL_BASED_OUTPATIENT_CLINIC_OR_DEPARTMENT_OTHER): Payer: Self-pay | Admitting: Family

## 2022-12-18 MED ORDER — ENTRESTO 49-51 MG PO TABS: 1.0000 | ORAL_TABLET | Freq: Two times a day (BID) | ORAL | 3 refills | Status: DC

## 2022-12-18 NOTE — Telephone Encounter (Signed)
Just refill the one 49-51mg  BID  Alver Sorrow, NP

## 2022-12-22 ENCOUNTER — Other Ambulatory Visit: Payer: Self-pay

## 2022-12-22 ENCOUNTER — Emergency Department (HOSPITAL_BASED_OUTPATIENT_CLINIC_OR_DEPARTMENT_OTHER)
Admission: EM | Admit: 2022-12-22 | Discharge: 2022-12-22 | Disposition: A | Discharge status: Home / Self Care | Payer: 59 | Attending: Emergency Medicine | Admitting: Emergency Medicine

## 2022-12-22 ENCOUNTER — Emergency Department (HOSPITAL_BASED_OUTPATIENT_CLINIC_OR_DEPARTMENT_OTHER): Payer: 59

## 2022-12-22 ENCOUNTER — Encounter (HOSPITAL_BASED_OUTPATIENT_CLINIC_OR_DEPARTMENT_OTHER): Payer: Self-pay | Admitting: Emergency Medicine

## 2022-12-22 ENCOUNTER — Ambulatory Visit
Admission: EM | Admit: 2022-12-22 | Discharge: 2022-12-22 | Disposition: A | Discharge status: Home / Self Care | Payer: 59 | Source: Home / Self Care

## 2022-12-22 DIAGNOSIS — I252 Old myocardial infarction: Secondary | ICD-10-CM | POA: Insufficient documentation

## 2022-12-22 DIAGNOSIS — U071 COVID-19: Secondary | ICD-10-CM

## 2022-12-22 DIAGNOSIS — J45909 Unspecified asthma, uncomplicated: Secondary | ICD-10-CM | POA: Insufficient documentation

## 2022-12-22 DIAGNOSIS — Z7982 Long term (current) use of aspirin: Secondary | ICD-10-CM | POA: Insufficient documentation

## 2022-12-22 DIAGNOSIS — I200 Unstable angina: Secondary | ICD-10-CM

## 2022-12-22 DIAGNOSIS — B349 Viral infection, unspecified: Secondary | ICD-10-CM | POA: Insufficient documentation

## 2022-12-22 DIAGNOSIS — Z1152 Encounter for screening for COVID-19: Secondary | ICD-10-CM | POA: Insufficient documentation

## 2022-12-22 DIAGNOSIS — E119 Type 2 diabetes mellitus without complications: Secondary | ICD-10-CM | POA: Insufficient documentation

## 2022-12-22 DIAGNOSIS — Z794 Long term (current) use of insulin: Secondary | ICD-10-CM | POA: Insufficient documentation

## 2022-12-22 DIAGNOSIS — I110 Hypertensive heart disease with heart failure: Secondary | ICD-10-CM | POA: Insufficient documentation

## 2022-12-22 DIAGNOSIS — R0689 Other abnormalities of breathing: Secondary | ICD-10-CM | POA: Diagnosis present

## 2022-12-22 DIAGNOSIS — R079 Chest pain, unspecified: Secondary | ICD-10-CM | POA: Insufficient documentation

## 2022-12-22 DIAGNOSIS — R0602 Shortness of breath: Secondary | ICD-10-CM | POA: Insufficient documentation

## 2022-12-22 DIAGNOSIS — Z8616 Personal history of COVID-19: Secondary | ICD-10-CM | POA: Insufficient documentation

## 2022-12-22 DIAGNOSIS — I5022 Chronic systolic (congestive) heart failure: Secondary | ICD-10-CM | POA: Insufficient documentation

## 2022-12-22 DIAGNOSIS — Z8673 Personal history of transient ischemic attack (TIA), and cerebral infarction without residual deficits: Secondary | ICD-10-CM | POA: Insufficient documentation

## 2022-12-22 LAB — CBC WITH DIFFERENTIAL/PLATELET
Abs Immature Granulocytes: 0.01 10*3/uL (ref 0.00–0.07)
Basophils Absolute: 0 10*3/uL (ref 0.0–0.1)
Basophils Relative: 1 %
Eosinophils Absolute: 0.5 10*3/uL (ref 0.0–0.5)
Eosinophils Relative: 8 %
HCT: 43.1 % (ref 36.0–46.0)
Hemoglobin: 14.2 g/dL (ref 12.0–15.0)
Immature Granulocytes: 0 %
Lymphocytes Relative: 33 %
Lymphs Abs: 2.1 10*3/uL (ref 0.7–4.0)
MCH: 29.4 pg (ref 26.0–34.0)
MCHC: 32.9 g/dL (ref 30.0–36.0)
MCV: 89.2 fL (ref 80.0–100.0)
Monocytes Absolute: 0.3 10*3/uL (ref 0.1–1.0)
Monocytes Relative: 5 %
Neutro Abs: 3.4 10*3/uL (ref 1.7–7.7)
Neutrophils Relative %: 53 %
Platelets: 309 10*3/uL (ref 150–400)
RBC: 4.83 MIL/uL (ref 3.87–5.11)
RDW: 13.1 % (ref 11.5–15.5)
WBC: 6.4 10*3/uL (ref 4.0–10.5)
nRBC: 0 % (ref 0.0–0.2)

## 2022-12-22 LAB — BASIC METABOLIC PANEL
Anion gap: 8 (ref 5–15)
BUN: 12 mg/dL (ref 8–23)
CO2: 28 mmol/L (ref 22–32)
Calcium: 9.2 mg/dL (ref 8.9–10.3)
Chloride: 101 mmol/L (ref 98–111)
Creatinine, Ser: 0.86 mg/dL (ref 0.44–1.00)
GFR, Estimated: >60 mL/min (ref >60)
Glucose, Bld: 207 mg/dL — ABNORMAL HIGH (ref 70–99)
Potassium: 3.9 mmol/L (ref 3.5–5.1)
Sodium: 137 mmol/L (ref 135–145)

## 2022-12-22 LAB — TROPONIN I (HIGH SENSITIVITY): Troponin I (High Sensitivity): 5 ng/L (ref <18)

## 2022-12-22 MED ORDER — PAXLOVID (300/100) 20 X 150 MG & 10 X 100MG PO TBPK: 3.0000 | ORAL_TABLET | Freq: Two times a day (BID) | ORAL | 0 refills | Status: DC

## 2022-12-22 MED ORDER — PAXLOVID (300/100) 20 X 150 MG & 10 X 100MG PO TBPK: 3.0000 | ORAL_TABLET | Freq: Two times a day (BID) | ORAL | 0 refills | Status: AC

## 2022-12-22 NOTE — ED Provider Notes (Signed)
[place] EMERGENCY DEPARTMENT AT [facility] Provider Note   CSN: [id number] Arrival date & time: 12/22/22  1142     History  Chief Complaint  Patient presents with   Shortness of Breath   Chest Pain    MARJON RITACCO is a 67 y.o. female.  67 yo F with a chief complaints of chest pain and difficulty breathing.  This patient told me that feels like when she had COVID previously has been coughing congested for the past week.  She went to urgent care and they told her she needed to come here immediately to be ruled out for an MI.  She does have a history of an MI.  Feels like this is different.   Shortness of Breath Associated symptoms: chest pain   Chest Pain Associated symptoms: shortness of breath        Home Medications Prior to Admission medications   Medication Sig Start Date End Date Taking? Authorizing Provider  ADVAIR [facility] 115-21 MCG/ACT inhaler as needed. 12/14/20   [provider]  ALPRAZolam Prudy Feeler) 0.25 MG tablet Take 0.25 mg by mouth as needed. 12/09/20   [provider]  aspirin EC 81 MG tablet Take 1 tablet (81 mg total) by mouth daily. 01/01/19   Tereso Newcomer T, PA-C  Continuous Blood Gluc Sensor (FREESTYLE LIBRE 3 SENSOR) MISC APPLY 1 SENSOR TO UPPER ARM  EVERY 14 DAYS FOR CONTINUOUS  GLUCOSE MONITORING 10/05/21   Reather Littler, MD  furosemide (LASIX) 40 MG tablet TAKE 1 TABLET(40 MG) BY MOUTH DAILY 12/05/22   Alver Sorrow, NP  glucose blood (FREESTYLE TEST STRIPS) test strip Use as instructed 06/10/20   Reather Littler, MD  LANTUS SOLOSTAR 100 UNIT/ML Solostar Pen INJECT SUBCUTANEOUSLY 70  UNITS DAILY Patient taking differently: Inject 64 Units into the skin daily. 10/11/21   Reather Littler, MD  metoprolol succinate (TOPROL-XL) 50 MG 24 hr tablet TAKE 1 TABLET(50 MG) BY MOUTH DAILY WITH OR IMMEDIATELY FOLLOWING A MEAL 12/15/22   Weaver, Scott T, PA-C  MOUNJARO 5 MG/0.5ML Pen Inject 5 mg into the skin once a week.    [provider]   nirmatrelvir & ritonavir (PAXLOVID, 300/100,) 20 x 150 MG & 10 x 100MG  TBPK Take 3 tablets by mouth 2 (two) times daily for 5 days. 12/22/22 12/27/22  Melene Plan, DO  nitroGLYCERIN (NITROSTAT) 0.4 MG SL tablet DISSOLVE ONE TABLET UNDER TONGUE AS NEEDED FOR CHEST PAIN 05/18/20   Tonny Bollman, MD  ondansetron (ZOFRAN-ODT) 4 MG disintegrating tablet ondansetron 4 mg disintegrating tablet  TAKE 1 TABLET BY MOUTH EVERY 4 HOURS AS NEEDED FOR NAUSEA    [provider]  PRALUENT 75 MG/ML SOAJ Inject 1 mL (75 mg total) into the skin every 14 (fourteen) days. 10/16/22   Alver Sorrow, NP  sacubitril-valsartan (ENTRESTO) 49-51 MG Take 1 tablet by mouth 2 (two) times daily. 12/18/22   Alver Sorrow, NP  valACYclovir (VALTREX) 1000 MG tablet Take 1,000 mg by mouth 2 (two) times daily as needed (infection). 05/02/20   [provider]  venlafaxine XR (EFFEXOR-XR) 150 MG 24 hr capsule Take 150 mg by mouth daily.    [provider]      Allergies    Hydrocodone, Invokana [canagliflozin], Amlodipine, Carvedilol, Chlorthalidone, Doxazosin, Evolocumab, Hydralazine, Imdur [isosorbide nitrate], Jardiance [empagliflozin], Rosuvastatin, Simvastatin, Spironolactone, Atorvastatin, and Compazine [prochlorperazine]    Review of Systems   Review of Systems  Respiratory:  Positive for shortness of breath.   Cardiovascular:  Positive for chest pain.    Physical Exam Updated Vital Signs BP (!) 179/87 (BP Location: Right Arm)   Pulse 85   Temp 98.3 F (36.8 C)   Resp 15   SpO2 100%  Physical Exam Vitals and nursing note reviewed.  Constitutional:      General: She is not in acute distress.    Appearance: She is well-developed. She is not diaphoretic.  HENT:     Head: Normocephalic and atraumatic.     Comments: Swollen turbinates, posterior nasal drip.   Eyes:     Pupils: Pupils are equal, round, and reactive to light.  Cardiovascular:     Rate and Rhythm: Normal rate and regular  rhythm.     Heart sounds: No murmur heard.    No friction rub. No gallop.  Pulmonary:     Effort: Pulmonary effort is normal.     Breath sounds: No wheezing or rales.  Abdominal:     General: There is no distension.     Palpations: Abdomen is soft.     Tenderness: There is no abdominal tenderness.  Musculoskeletal:        General: No tenderness.     Cervical back: Normal range of motion and neck supple.  Skin:    General: Skin is warm and dry.  Neurological:     Mental Status: She is alert and oriented to person, place, and time.  Psychiatric:        Behavior: Behavior normal.     ED Results / Procedures / Treatments   Labs (all labs ordered are listed, but only abnormal results are displayed) Labs Reviewed  BASIC METABOLIC PANEL - Abnormal; Notable for the following components:      Result Value   Glucose, Bld 207 (*)    All other components within normal limits  CBC WITH DIFFERENTIAL/PLATELET  TROPONIN I (HIGH SENSITIVITY)    EKG EKG Interpretation Date/Time:  Friday December 22 2022 11:51:14 EDT Ventricular Rate:  85 PR Interval:  192 QRS Duration:  89 QT Interval:  351 QTC Calculation: 418 R Axis:   68  Text Interpretation: Sinus rhythm Inferior infarct, age indeterminate No significant change since last tracing Confirmed by Melene Plan [phone]) on 12/22/2022 1:19:47 PM  Radiology DG Chest Port 1 View  Result Date: 12/22/2022 CLINICAL DATA:  Chest pain, cough. EXAM: PORTABLE CHEST 1 VIEW COMPARISON:  June 08, 2021. FINDINGS: The heart size and mediastinal contours are within normal limits. Both lungs are clear. The visualized skeletal structures are unremarkable. IMPRESSION: No active disease. Electronically Signed   By: Lupita Raider M.D.   On: 12/22/2022 12:48    Procedures Procedures    Medications Ordered in ED Medications - No data to display  ED Course/ Medical Decision Making/ A&P                                 Medical Decision Making Amount  and/or Complexity of Data Reviewed Labs: ordered. Radiology: ordered.  Risk Prescription drug management.   67 yo F with chief complaints of what sounds like an upper respiratory illness cough and congestion and started feeling some chest pressure this morning.  Had something similar when she had COVID a couple years ago.  She took Paxlovid and felt like that helped significantly.  She has been around someone that has been tested positive for COVID as well.  She had COVID testing done urgent care  today.  I do not feel this needs to be repeated.  Chest x-ray independently interpreted by me without focal infiltrate pneumothorax.  Her troponins negative.  EKG is nonischemic.  No significant electrolyte abnormalities no anemia.  The patient follow-up with her family doctor in the office.  1:53 PM:  I have discussed the diagnosis/risks/treatment options with the patient and family.  Evaluation and diagnostic testing in the emergency department does not suggest an emergent condition requiring admission or immediate intervention beyond what has been performed at this time.  They will follow up with PCP. We also discussed returning to the ED immediately if new or worsening sx occur. We discussed the sx which are most concerning (e.g., sudden worsening pain, fever, inability to tolerate by mouth) that necessitate immediate return. Medications administered to the patient during their visit and any new prescriptions provided to the patient are listed below.  Medications given during this visit Medications - No data to display   The patient appears reasonably screen and/or stabilized for discharge and I doubt any other medical condition or other [facility] requiring further screening, evaluation, or treatment in the ED at this time prior to discharge.          Final Clinical Impression(s) / ED Diagnoses Final diagnoses:  COVID-19 virus infection    Rx / DC Orders ED Discharge Orders          Ordered     nirmatrelvir & ritonavir (PAXLOVID, 300/100,) 20 x 150 MG & 10 x 100MG  TBPK  2 times daily,   Status:  Discontinued        12/22/22 1320    nirmatrelvir & ritonavir (PAXLOVID, 300/100,) 20 x 150 MG & 10 x 100MG  TBPK  2 times daily        12/22/22 1341              Melene Plan, DO 12/22/22 1353

## 2022-12-22 NOTE — ED Triage Notes (Addendum)
Pt c/o L Cp that radiates to L arm and neck that began this morning w/ exertion. Pt also c/o Sob. Pt reports MI in 2019. Pt reports similar s/s w/ covid in past. Recent exposure to covid

## 2022-12-22 NOTE — ED Triage Notes (Addendum)
Pt c/o CP that radiates down LUE, SHOB started this am-also c/o cough x 2 days-NAD-steady-pt later added she feels the same as when she had covid except no fever

## 2022-12-22 NOTE — ED Provider Notes (Signed)
[facility] - URGENT CARE CENTER  Note:  This document was prepared using Conservation officer, historic buildings and may include unintentional dictation errors.  MRN: [id number] DOB: Feb 23, 1956  Subjective:   Destiny Beasley is a 67 y.o. female presenting for 2-day history of acute onset persistent moderate central chest pain that radiates toward the left side and toward her left arm.  She has also had shortness of breath, body aches, coughing.  Believes she has COVID-19.  Would like to get a test.  Patient has significant cardiac risk factors including history of heart disease, history of STEMI, last episode was in 2019 and patient did go into cardiogenic shock.  She also has a history of CVA, cerebrovascular disease, asthma, chronic congestive heart failure.  Patient is a diabetic.  No smoking of any kind.  No current facility-administered medications for this encounter.  Current Outpatient Medications:    ADVAIR [facility]FA 115-21 MCG/ACT inhaler, as needed., Disp: , Rfl:    ALPRAZolam (XANAX) 0.25 MG tablet, Take 0.25 mg by mouth as needed., Disp: , Rfl:    aspirin EC 81 MG tablet, Take 1 tablet (81 mg total) by mouth daily., Disp: 90 tablet, Rfl: 3   Continuous Blood Gluc Sensor (FREESTYLE LIBRE 3 SENSOR) MISC, APPLY 1 SENSOR TO UPPER ARM  EVERY 14 DAYS FOR CONTINUOUS  GLUCOSE MONITORING, Disp: 7 each, Rfl: 3   furosemide (LASIX) 40 MG tablet, TAKE 1 TABLET(40 MG) BY MOUTH DAILY, Disp: 90 tablet, Rfl: 3   glucose blood (FREESTYLE TEST STRIPS) test strip, Use as instructed, Disp: 100 each, Rfl: 12   LANTUS SOLOSTAR 100 UNIT/ML Solostar Pen, INJECT SUBCUTANEOUSLY 70  UNITS DAILY (Patient taking differently: Inject 64 Units into the skin daily.), Disp: 75 mL, Rfl: 3   metoprolol succinate (TOPROL-XL) 50 MG 24 hr tablet, TAKE 1 TABLET(50 MG) BY MOUTH DAILY WITH OR IMMEDIATELY FOLLOWING A MEAL, Disp: 90 tablet, Rfl: 3   MOUNJARO 5 MG/0.5ML Pen, Inject 5 mg into the skin once a week., Disp: , Rfl:     nitroGLYCERIN (NITROSTAT) 0.4 MG SL tablet, DISSOLVE ONE TABLET UNDER TONGUE AS NEEDED FOR CHEST PAIN, Disp: 25 tablet, Rfl: 4   ondansetron (ZOFRAN-ODT) 4 MG disintegrating tablet, ondansetron 4 mg disintegrating tablet  TAKE 1 TABLET BY MOUTH EVERY 4 HOURS AS NEEDED FOR NAUSEA, Disp: , Rfl:    PRALUENT 75 MG/ML SOAJ, Inject 1 mL (75 mg total) into the skin every 14 (fourteen) days., Disp: 6 mL, Rfl: 3   sacubitril-valsartan (ENTRESTO) 49-51 MG, Take 1 tablet by mouth 2 (two) times daily., Disp: 180 tablet, Rfl: 3   valACYclovir (VALTREX) 1000 MG tablet, Take 1,000 mg by mouth 2 (two) times daily as needed (infection)., Disp: , Rfl:    venlafaxine XR (EFFEXOR-XR) 150 MG 24 hr capsule, Take 150 mg by mouth daily., Disp: , Rfl:    Allergies  Allergen Reactions   Hydrocodone Other (See Comments)    Severe anxiety Confusion    Invokana [Canagliflozin] Other (See Comments)    Constant yeast infections   Amlodipine Other (See Comments)    Muscles hurt   Carvedilol Other (See Comments)    Wheezing with 25mg  BID dosing NOT ALLERGIC 10/08/20   Chlorthalidone Other (See Comments)    Leg pain   Doxazosin     LEG CRAMPS    Evolocumab Other (See Comments)    Injection site pain   Hydralazine Other (See Comments)    Fatigue   Imdur [Isosorbide Nitrate] Other (See Comments)  Patient reported wheezing   Jardiance [Empagliflozin] Other (See Comments)    lightheadedness, weakness, low BP and yeast infection.  NOT ALLERGIC 10/08/20   Rosuvastatin Other (See Comments)    myalgias   Simvastatin Other (See Comments)    myalgias   Spironolactone Other (See Comments)    Wheezing vs fatigue?   Atorvastatin Other (See Comments)    DIZZINESS   Compazine [Prochlorperazine] Anxiety    Past Medical History:  Diagnosis Date   Anemia    Anxiety    CAD in native artery    a. Inf STEMI 06/2017 complicated by cardiogenic shock/complete heart block s/p emergent cath showing culprit large dominant LCx  s/p aspiration thrombectomy and DES, EF 50% by cath, 40-45% by echo.   Chronic systolic heart failure (HCC) 06/25/2017   Echo 11/19: mild LVH, EF 40-45, inf-lat HK, Gr 1 DD, trivial MR   Complete heart block, transient (HCC)    a. 06/2017 in setting of acute inf MI -> resolved after PCI.   COVID-19    Diabetes mellitus    Goiter    History of radiation therapy 06/14/2016 - 07/19/2016   Right Breast 50 Gy 25 fractions   Hypercholesteremia    Hypertension    Ischemic cardiomyopathy    a. EF 50% by cath and 40-45% by echo 06/2017.   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   Meningioma [facility])    Nuclear stress test    Nuclear stress test 10/19: EF 35, inferolateral, apical scar; inferior, apical inferior infarct with mild peri-infarct ischemia; high risk   Obesity    Personal history of chemotherapy    Personal history of radiation therapy    Pituitary tumor    Seasonal asthma    Sleep apnea    does not use every night   Stroke [facility])    TIA - Dec 1 st, 2017     Past Surgical History:  Procedure Laterality Date   ABDOMINAL HYSTERECTOMY     BRAIN MENINGIOMA EXCISION  2005   BREAST BIOPSY     BREAST LUMPECTOMY Right    2017   BREAST LUMPECTOMY WITH RADIOACTIVE SEED LOCALIZATION Right 04/20/2016   Procedure: RIGHT BREAST LUMPECTOMY WITH RADIOACTIVE SEED LOCALIZATION;  Surgeon: Claud Kelp, MD;  Location: [facility];  Service: General;  Laterality: Right;   BREAST REDUCTION SURGERY Bilateral 10/23/2016   Procedure: BILATERAL BREAST REDUCTION WITH LIPOSUCTION ASSISTANCE;  Surgeon: Louisa Second, MD;  Location: [facility];  Service: Plastics;  Laterality: Bilateral;   CESAREAN SECTION     CORONARY STENT INTERVENTION N/A 06/25/2017   Procedure: CORONARY STENT INTERVENTION;  Surgeon: Tonny Bollman, MD;  Location: [facility] INVASIVE CV LAB;  Service: Cardiovascular;   Laterality: N/A;   CORONARY/GRAFT ACUTE MI REVASCULARIZATION N/A 06/25/2017   Procedure: Coronary/Graft Acute MI Revascularization;  Surgeon: Tonny Bollman, MD;  Location: [facility] INVASIVE CV LAB;  Service: Cardiovascular;  Laterality: N/A;   FOOT SURGERY Bilateral 2016   hammer toe surgery   LEFT HEART CATH AND CORONARY ANGIOGRAPHY N/A 06/25/2017   Procedure: LEFT HEART CATH AND CORONARY ANGIOGRAPHY;  Surgeon: Tonny Bollman, MD;  Location: [facility] INVASIVE CV LAB;  Service: Cardiovascular;  Laterality: N/A;   LEFT HEART CATH AND CORONARY ANGIOGRAPHY N/A 05/13/2020   Procedure: LEFT HEART CATH AND CORONARY ANGIOGRAPHY;  Surgeon: Runell Gess, MD;  Location: [facility] INVASIVE CV LAB;  Service: Cardiovascular;  Laterality: N/A;   MENISCUS REPAIR Left 2015   PITUITARY SURGERY     REDUCTION MAMMAPLASTY     RIGHT HEART CATH N/A 06/25/2017   Procedure: RIGHT HEART CATH;  Surgeon: Tonny Bollman, MD;  Location: [facility] INVASIVE CV LAB;  Service: Cardiovascular;  Laterality: N/A;   THYROID SURGERY      Family History  Problem Relation Age of Onset   Heart attack Mother    Diabetes Mother    Hyperlipidemia Mother    Hypertension Mother    Hypertension Sister    Alcohol abuse Brother    Stroke Maternal Uncle     Social History   Tobacco Use   Smoking status: Never   Smokeless tobacco: Never  Vaping Use   Vaping status: Never Used  Substance Use Topics   Alcohol use: Yes    Alcohol/week: 1.0 standard drink of alcohol    Types: 1 Glasses of wine per week    Comment: 1 glass monthly   Drug use: No    ROS   Objective:   Vitals: BP (!) 179/103 (BP Location: Left Arm)   Pulse 81   Temp 98.2 F (36.8 C) (Oral)   Resp 20   SpO2 98%   Physical Exam Constitutional:      General: She is not in acute distress.    Appearance: Normal appearance. She is well-developed. She is not ill-appearing, toxic-appearing or diaphoretic.  HENT:     Head: Normocephalic and atraumatic.     Nose: Nose normal.      Mouth/Throat:     Mouth: Mucous membranes are moist.  Eyes:     General: No scleral icterus.       Right eye: No discharge.        Left eye: No discharge.     Extraocular Movements: Extraocular movements intact.  Cardiovascular:     Rate and Rhythm: Normal rate and regular rhythm.     Heart sounds: Normal heart sounds. No murmur heard.    No friction rub. No gallop.  Pulmonary:     Effort: Pulmonary effort is normal. No respiratory distress.     Breath sounds: No stridor. No wheezing, rhonchi or rales.  Chest:     Chest wall: Tenderness (reproducible over mid sternum) present.  Skin:    General: Skin is warm and dry.  Neurological:     General: No focal deficit present.     Mental Status: She is alert and oriented to person, place, and time.  Psychiatric:        Mood and Affect: Mood normal.        Behavior: Behavior normal.    ED ECG REPORT   Date: 12/22/2022  EKG Time: 11:23 AM  Rate: 78bpm  Rhythm: normal sinus rhythm,  unchanged from previous tracings  Axis: normal  Intervals:none  ST&T Change: T wave inversion in lead III, aVF, T wave flattening in lead II, V5 and V6  Narrative Interpretation: Sinus rhythm at 78 bpm with T wave changes as above, identical to her last EKG from 09/21/2022.     Assessment and Plan :   PDMP not reviewed this encounter.  1. Unstable angina (HCC)   2. Acute viral syndrome    Patient has a high heart score, multiple cardiac risk factors and is in need of a higher level of evaluation and care than we can provide in the urgent care setting.  I discussed this with patient, declines transport by EMS, would like to have her  daughter take her to the emergency room.  She requested a COVID test, testing pending.  This is appropriate as she does have sick symptoms.  Differential is extensive and does include pulmonary embolism, ACS, NSTEMI, aortic dissection, aortic aneurysm, acute on chronic congestive heart failure.  Patient did not want [place] to  discuss this with her daughter as well, she contracts for safety and agrees to present to the emergency room now by personal vehicle.   Wallis Bamberg, [place] 12/22/22 1126

## 2022-12-22 NOTE — Discharge Instructions (Addendum)
You have declined transport to the hospital by EMS. Please have your daughter take you straight to the emergency room as you are in need of a higher level of testing and care than we can provide in the urgent care setting.  This includes testing to make sure that you are not having a heart event such as a heart attack or a blood clot in your lungs.  You may very well have COVID given your sick symptoms and therefore we are getting a test for you.  However, with your significant heart history, it is a high risk for your heart and your lungs and therefore I emphasized to go straight to the hospital.

## 2022-12-22 NOTE — Discharge Instructions (Signed)
Your marker for heart damage was normal.  Unlikely to be a heart attack and as we discussed most likely to be COVID.  Please follow-up with your family doctor in the office.  I prescribed you the medication that he had been prescribed previously.  No that has a lot of drug interactions.  Your PCP may ask you to pause some of your medicines.

## 2022-12-22 NOTE — ED Notes (Signed)
Patient is being discharged from the Urgent Care and sent to the Emergency Department via POV . Per Urban Gibson, PA-C, patient is in need of higher level of care due to CP. Patient is aware and verbalizes understanding of plan of care.  Vitals:   12/22/22 1102  BP: (!) 179/103  Pulse: 81  Resp: 20  Temp: 98.2 F (36.8 C)  SpO2: 98%

## 2022-12-23 LAB — SARS CORONAVIRUS 2 (TAT 6-24 HRS): SARS Coronavirus 2: NEGATIVE

## 2023-01-12 ENCOUNTER — Encounter (HOSPITAL_BASED_OUTPATIENT_CLINIC_OR_DEPARTMENT_OTHER): Payer: Self-pay

## 2023-01-15 ENCOUNTER — Other Ambulatory Visit (HOSPITAL_BASED_OUTPATIENT_CLINIC_OR_DEPARTMENT_OTHER): Payer: Self-pay | Admitting: Family

## 2023-03-01 ENCOUNTER — Encounter (HOSPITAL_BASED_OUTPATIENT_CLINIC_OR_DEPARTMENT_OTHER): Payer: Self-pay

## 2023-03-01 DIAGNOSIS — I2510 Atherosclerotic heart disease of native coronary artery without angina pectoris: Secondary | ICD-10-CM | POA: Diagnosis not present

## 2023-03-01 DIAGNOSIS — I10 Essential (primary) hypertension: Secondary | ICD-10-CM | POA: Diagnosis not present

## 2023-03-01 DIAGNOSIS — Z794 Long term (current) use of insulin: Secondary | ICD-10-CM | POA: Diagnosis not present

## 2023-03-01 DIAGNOSIS — R809 Proteinuria, unspecified: Secondary | ICD-10-CM | POA: Diagnosis not present

## 2023-03-01 DIAGNOSIS — R110 Nausea: Secondary | ICD-10-CM | POA: Diagnosis not present

## 2023-03-01 DIAGNOSIS — E1129 Type 2 diabetes mellitus with other diabetic kidney complication: Secondary | ICD-10-CM | POA: Diagnosis not present

## 2023-03-01 DIAGNOSIS — G4733 Obstructive sleep apnea (adult) (pediatric): Secondary | ICD-10-CM | POA: Diagnosis not present

## 2023-03-01 DIAGNOSIS — E66812 Obesity, class 2: Secondary | ICD-10-CM | POA: Diagnosis not present

## 2023-03-02 ENCOUNTER — Telehealth: Payer: Self-pay

## 2023-03-02 DIAGNOSIS — G4733 Obstructive sleep apnea (adult) (pediatric): Secondary | ICD-10-CM | POA: Diagnosis not present

## 2023-03-02 DIAGNOSIS — E66812 Obesity, class 2: Secondary | ICD-10-CM | POA: Diagnosis not present

## 2023-03-02 DIAGNOSIS — Z794 Long term (current) use of insulin: Secondary | ICD-10-CM | POA: Diagnosis not present

## 2023-03-02 DIAGNOSIS — E1129 Type 2 diabetes mellitus with other diabetic kidney complication: Secondary | ICD-10-CM | POA: Diagnosis not present

## 2023-03-02 DIAGNOSIS — I10 Essential (primary) hypertension: Secondary | ICD-10-CM | POA: Diagnosis not present

## 2023-03-02 DIAGNOSIS — R110 Nausea: Secondary | ICD-10-CM | POA: Diagnosis not present

## 2023-03-02 DIAGNOSIS — I2510 Atherosclerotic heart disease of native coronary artery without angina pectoris: Secondary | ICD-10-CM | POA: Diagnosis not present

## 2023-03-02 DIAGNOSIS — R809 Proteinuria, unspecified: Secondary | ICD-10-CM | POA: Diagnosis not present

## 2023-03-02 NOTE — Telephone Encounter (Signed)
   Name: MIESHA THAGGARD  DOB: 09-01-1955  MRN: [id number]  Primary Cardiologist: Tonny Bollman, MD  Chart reviewed as part of pre-operative protocol coverage. The patient has an upcoming visit scheduled with Gillian Shields, NP on 03/08/2023 at which time clearance can be addressed in case there are any issues that would impact surgical recommendations.  I added preop FYI to appointment note so that provider is aware to address at time of outpatient visit.  Per office protocol the cardiology provider should forward their finalized clearance decision and recommendations regarding antiplatelet therapy to the requesting party below.     I will route this message as FYI to requesting party and remove this message from the preop box as separate preop APP input not needed at this time.   Please call with any questions.  Napoleon Form, Leodis Rains, NP  03/02/2023, 12:05 PM

## 2023-03-02 NOTE — Telephone Encounter (Signed)
   Pre-operative Risk Assessment    Patient Name: Destiny Beasley  DOB: 10/18/1955 MRN: [id number]   Last ov:11/30/22 Upcoming visit:03/08/23     Request for Surgical Clearance    Procedure:   Bariatric surgery   Date of Surgery:  Clearance TBD                                 Surgeon:  Feliciana Rossetti, MD  Surgeon's Group or Practice Name:  [facility]  Phone number:  [phone] Fax number:  [phone]   Type of Clearance Requested:   - Medical  - Pharmacy:  Hold Aspirin Not indicated    Type of Anesthesia:  General    Additional requests/questions:    Vance Peper   03/02/2023, 11:52 AM

## 2023-03-05 DIAGNOSIS — G4733 Obstructive sleep apnea (adult) (pediatric): Secondary | ICD-10-CM | POA: Diagnosis not present

## 2023-03-06 DIAGNOSIS — K1120 Sialoadenitis, unspecified: Secondary | ICD-10-CM | POA: Diagnosis not present

## 2023-03-07 ENCOUNTER — Other Ambulatory Visit (HOSPITAL_COMMUNITY): Payer: Self-pay | Admitting: General Surgery

## 2023-03-07 DIAGNOSIS — R110 Nausea: Secondary | ICD-10-CM

## 2023-03-07 DIAGNOSIS — E66812 Obesity, class 2: Secondary | ICD-10-CM

## 2023-03-07 DIAGNOSIS — R109 Unspecified abdominal pain: Secondary | ICD-10-CM

## 2023-03-08 ENCOUNTER — Encounter (HOSPITAL_BASED_OUTPATIENT_CLINIC_OR_DEPARTMENT_OTHER): Payer: Self-pay | Admitting: Family

## 2023-03-08 ENCOUNTER — Ambulatory Visit (HOSPITAL_BASED_OUTPATIENT_CLINIC_OR_DEPARTMENT_OTHER): Payer: PPO | Admitting: Family

## 2023-03-08 VITALS — BP 148/86 | HR 86 | Ht 72.0 in | Wt 263.1 lb

## 2023-03-08 DIAGNOSIS — I10 Essential (primary) hypertension: Secondary | ICD-10-CM | POA: Diagnosis not present

## 2023-03-08 DIAGNOSIS — E785 Hyperlipidemia, unspecified: Secondary | ICD-10-CM | POA: Diagnosis not present

## 2023-03-08 DIAGNOSIS — I25118 Atherosclerotic heart disease of native coronary artery with other forms of angina pectoris: Secondary | ICD-10-CM | POA: Diagnosis not present

## 2023-03-08 DIAGNOSIS — G4733 Obstructive sleep apnea (adult) (pediatric): Secondary | ICD-10-CM | POA: Diagnosis not present

## 2023-03-08 DIAGNOSIS — Z01810 Encounter for preprocedural cardiovascular examination: Secondary | ICD-10-CM | POA: Diagnosis not present

## 2023-03-08 DIAGNOSIS — I5020 Unspecified systolic (congestive) heart failure: Secondary | ICD-10-CM | POA: Diagnosis not present

## 2023-03-08 NOTE — Patient Instructions (Signed)
Medication Instructions:  Your physician recommends that you continue on your current medications as directed. Please refer to the Current Medication list given to you today.   Please hold Asprin 1 week before your surgery.   *If you need a refill on your cardiac medications before your next appointment, please call your pharmacy*   Follow-Up: At [facility], you and your health needs are our priority.  As part of our continuing mission to provide you with exceptional heart care, we have created designated Provider Care Teams.  These Care Teams include your primary Cardiologist (physician) and Advanced Practice Providers (APPs -  Physician Assistants and Nurse Practitioners) who all work together to provide you with the care you need, when you need it.  We recommend signing up for the patient portal called "MyChart".  Sign up information is provided on this After Visit Summary.  MyChart is used to connect with patients for Virtual Visits (Telemedicine).  Patients are able to view lab/test results, encounter notes, upcoming appointments, etc.  Non-urgent messages can be sent to your provider as well.   To learn more about what you can do with MyChart, go to [email or web address].    Your next appointment:   4 month(s) adv htn clinic  Provider:   Gillian Shields, NP

## 2023-03-08 NOTE — Progress Notes (Signed)
Advanced Hypertension Assessment:    Date:  03/08/2023   ID:  [name], DOB 04-03-1956, MRN [id number]  PCP:  Daisy Floro, MD  Cardiologist:  Tonny Bollman, MD / Dr. [facility] Nephrologist:  Referring MD: Daisy Floro, MD   CC: Hypertension  History of Present Illness:    JAMAAL ABELN is a 67 y.o. female with a hx of CAD s/p inferior-lateral STEMI 06/2017 with DES to LCx, HFrEF, ICM, DM2, hyperthyroidism, TIA, OSA, breast cancer, HTN, HLD with statin intolerance, COVID19, pituitary tumor s/p resection, meningioma, anxiety  here to follow up in the [facility].   CAD dates back to inf-lat STEMI 06/2017 with DES to pLCx. Cardiac catheterization 04/2020 with patent LCx stent and pLAD with 40% stenosis. Her LVEF since her STEMI has been 40-45%.    Via telemedicine 08/2020 started on Lasix. Seen 10/08/20 with complaint of hot flashes which she attributed to Carvedilol. As leaving office developed chest pain and transported to ED - symptoms resolved with nitroglycerin and Xanax, Hs-troponin negative x2, d-dimer minimally elevated 0.62 though negative when adjusted for age. Head CT unremarkable.    ED visit 10/19/20 was COVID positive treated with antiviral. Visit 11/01/20, 12/14/20, and 12/28/20 with optimization of antihypertensive regimen.  Seen 01/07/21 and Sherryll Burger increased to 97-103mg  BID. Did not tolerate increased dose and reduce to 49-51mg  BID. 07/2021 she was started on doxazosin. Echo 07/2021 LVEF 45-50%, hypokinesis inferolateral wall, gr1DDtrivial MR. She was referred to [facility].   Established with [facility] 09/2021. Hypertension initially diagnosed in her 30s and has always been difficult to control. Enrolled in [employer]. Referred to [facility]. Metoprolol transitioned to Bisoprolol. She self discontinued after 2 weeks due to muscle pain.    11/03/21 Hydralazine 25mg  BID started by pharmacy team. She discontinued to to  reported chest pain. She was stared on Minoxidil 11/07/21. She self discontinued due to muscle pain, cramps. Seen 12/07/21 and Lasix increased to 40mg  for BP control.   At visit 09/21/22 Entresto increased on 97-1 03 milligrams twice daily.  Renal duplex ordered and performed 11/27/2022 with no evidence of RAS. At follow up 11/30/22 noted she was not tolerating the higher dose Entresto and had returned to 49-51mg  BID dose.    Presents today for preoperative clearance for bariatric surgery. Exercising at [employer] - aerobics 3x per week and weight changing 2x per week. Retired in September and enjoying retirement. Blood pressure at home most often 140s/80s. Gastric bypass got a new BIPAP two days ago which she likes. Reports no shortness of breath nor dyspnea on exertion. Reports no chest pain, pressure, or tightness. No edema, orthopnea, PND. Reports no palpitations.     Previous antihypertensives: Amlodipine (muscle pain) Coreg (wheeze with 25mg  dose) Chlorthalidone (leg pain) Spironolactone (wheeze/fatigue) Jardiance (hallucinations, yeast infections) - now tolerating 10mg  dose Entresto (intolerant to 97-103mg  dose with cramps, tolerates 49-51mg  dose) Hydralazine (fatigue) Imdur (wheeze) Doxazosin (leg cramps)  Past Medical History:  Diagnosis Date   Anemia    Anxiety    CAD in native artery    a. Inf STEMI 06/2017 complicated by cardiogenic shock/complete heart block s/p emergent cath showing culprit large dominant LCx s/p aspiration thrombectomy and DES, EF 50% by cath, 40-45% by echo.   Chronic systolic heart failure (HCC) 06/25/2017   Echo 11/19: mild LVH, EF 40-45, inf-lat HK, Gr 1 DD, trivial MR   Complete heart block, transient (HCC)    a. 06/2017 in setting of  acute inf MI -> resolved after PCI.   COVID-19    Diabetes mellitus    Goiter    History of radiation therapy 06/14/2016 - 07/19/2016   Right Breast 50 Gy 25 fractions   Hypercholesteremia    Hypertension    Ischemic  cardiomyopathy    a. EF 50% by cath and 40-45% by echo 06/2017.   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   Meningioma [facility])    Nuclear stress test    Nuclear stress test 10/19: EF 35, inferolateral, apical scar; inferior, apical inferior infarct with mild peri-infarct ischemia; high risk   Obesity    Personal history of chemotherapy    Personal history of radiation therapy    Pituitary tumor    Seasonal asthma    Sleep apnea    does not use every night   Stroke [facility])    TIA - Dec 1 st, 2017    Past Surgical History:  Procedure Laterality Date   ABDOMINAL HYSTERECTOMY     BRAIN MENINGIOMA EXCISION  2005   BREAST BIOPSY     BREAST LUMPECTOMY Right    2017   BREAST LUMPECTOMY WITH RADIOACTIVE SEED LOCALIZATION Right 04/20/2016   Procedure: RIGHT BREAST LUMPECTOMY WITH RADIOACTIVE SEED LOCALIZATION;  Surgeon: Claud Kelp, MD;  Location: [facility];  Service: General;  Laterality: Right;   BREAST REDUCTION SURGERY Bilateral 10/23/2016   Procedure: BILATERAL BREAST REDUCTION WITH LIPOSUCTION ASSISTANCE;  Surgeon: Louisa Second, MD;  Location: [facility];  Service: Plastics;  Laterality: Bilateral;   CESAREAN SECTION     CORONARY STENT INTERVENTION N/A 06/25/2017   Procedure: CORONARY STENT INTERVENTION;  Surgeon: Tonny Bollman, MD;  Location: [facility] INVASIVE CV LAB;  Service: Cardiovascular;  Laterality: N/A;   CORONARY/GRAFT ACUTE MI REVASCULARIZATION N/A 06/25/2017   Procedure: Coronary/Graft Acute MI Revascularization;  Surgeon: Tonny Bollman, MD;  Location: [facility] INVASIVE CV LAB;  Service: Cardiovascular;  Laterality: N/A;   FOOT SURGERY Bilateral 2016   hammer toe surgery   LEFT HEART CATH AND CORONARY ANGIOGRAPHY N/A 06/25/2017   Procedure: LEFT HEART CATH AND CORONARY ANGIOGRAPHY;  Surgeon: Tonny Bollman, MD;  Location: [facility] INVASIVE CV LAB;  Service:  Cardiovascular;  Laterality: N/A;   LEFT HEART CATH AND CORONARY ANGIOGRAPHY N/A 05/13/2020   Procedure: LEFT HEART CATH AND CORONARY ANGIOGRAPHY;  Surgeon: Runell Gess, MD;  Location: [facility] INVASIVE CV LAB;  Service: Cardiovascular;  Laterality: N/A;   MENISCUS REPAIR Left 2015   PITUITARY SURGERY     REDUCTION MAMMAPLASTY     RIGHT HEART CATH N/A 06/25/2017   Procedure: RIGHT HEART CATH;  Surgeon: Tonny Bollman, MD;  Location: [facility] INVASIVE CV LAB;  Service: Cardiovascular;  Laterality: N/A;   THYROID SURGERY      Current Medications: No outpatient medications have been marked as taking for the 03/08/23 encounter (Appointment) with Alver Sorrow, NP.     Allergies:   Hydrocodone, Invokana [canagliflozin], Amlodipine, Carvedilol, Chlorthalidone, Doxazosin, Evolocumab, Hydralazine, Imdur [isosorbide nitrate], Jardiance [empagliflozin], Rosuvastatin, Simvastatin, Spironolactone, Atorvastatin, and Compazine [prochlorperazine]   Social History   Socioeconomic History   Marital status: Single    Spouse name: Not on file   Number of children: Not on file   Years of education: Not on file   Highest education level: Bachelor's degree (e.g., BA, AB, BS)  Occupational History   Occupation: full time  Tobacco  Use   Smoking status: Never   Smokeless tobacco: Never  Vaping Use   Vaping status: Never Used  Substance and Sexual Activity   Alcohol use: Not Currently    Comment: occ   Drug use: No   Sexual activity: Not on file    Comment: no cycles; pt had hyst  Other Topics Concern   Not on file  Social History Narrative   Lives alone   Right Handed   Drinks 1-2 cups caffeine daily   Social Determinants of Health   Financial Resource Strain: Low Risk  (10/04/2021)   Overall Financial Resource Strain (CARDIA)    Difficulty of Paying Living Expenses: Not hard at all  Food Insecurity: No Food Insecurity (10/04/2021)   Hunger Vital Sign    Worried About Running Out of Food in the  Last Year: Never true    Ran Out of Food in the Last Year: Never true  Transportation Needs: No Transportation Needs (10/04/2021)   PRAPARE - Administrator, Civil Service (Medical): No    Lack of Transportation (Non-Medical): No  Physical Activity: Inactive (10/04/2021)   Exercise Vital Sign    Days of Exercise per Week: 0 days    Minutes of Exercise per Session: 0 min  Stress: Not on file  Social Connections: Not on file     Family History: The patient's family history includes Alcohol abuse in her brother; Diabetes in her mother; Heart attack in her mother; Hyperlipidemia in her mother; Hypertension in her mother and sister; Stroke in her maternal uncle.  ROS:   Please see the history of present illness.     All other systems reviewed and are negative.  EKGs/Labs/Other Studies Reviewed:    EKG:  EKG ordered today.  EKG performed today demonstrates NSR 77 bpm with no acute ST/T wave changes.  Echo 08/17/21  1. Hypokinesis of the inferolateral wall with overall mild LV  dysfunction.   2. Left ventricular ejection fraction, by estimation, is 45 to 50%. The  left ventricle has mildly decreased function. The left ventricle  demonstrates regional wall motion abnormalities (see scoring  diagram/findings for description). Left ventricular  diastolic parameters are consistent with Grade I diastolic dysfunction  (impaired relaxation).   3. Right ventricular systolic function is normal. The right ventricular  size is normal.   4. Left atrial size was mildly dilated.   5. The mitral valve is normal in structure. Trivial mitral valve  regurgitation. No evidence of mitral stenosis.   6. The aortic valve is tricuspid. Aortic valve regurgitation is not  visualized. Aortic valve sclerosis is present, with no evidence of aortic  valve stenosis.   Comparison(s): EF 40%,mild hypokinesis of the left ventricular, mid-apical  inferior wall and inferolateral wall, trivial AI.   Echocardiogram 05/13/20 EF 40-45, inf and inf-lat HK, normal RVSF, trivial MR, trivial AI   Cardiac catheterization 05/13/20 LAD prox 40 LCx stent patent EF 45-50    ABIs 07/2019 Normal    Echocardiogram 09/05/2018 EF 40-45, inf-lat AK, mild LVH, Gr 1 DD, normal RVSF   Echo 03/07/18 Mild LVH, EF 40-45, GLS -11%, inf-lat HK, Gr 1 DD, trivial MR   Nuclear stress test 02/12/18 EF 35, inferolateral, apical scar; inferior, apical inferior infarct with mild peri-infarct ischemia; high risk   Echo 06/26/17 EF 40-45, inferolateral hypokinesis, mild MR   Cardiac catheterization 06/25/17 LM luminal irregularities LAD luminal irregularities, proximal 30 LCx proximal 100 RCA irregularities EF 45-50, inferolateral akinesis PCI: 4 x  20 mm Synergy DES to the proximal LCx   Echo 03/25/16 Moderate LVH, EF 60-65, normal wall motion   Carotid US 12/17 Bilateral ICA 1-39  Recent Labs: 09/13/2022: BNP 24.4 12/22/2022: BUN 12; Creatinine, Ser 0.86; Hemoglobin 14.2; Platelets 309; Potassium 3.9; Sodium 137   Recent Lipid Panel    Component Value Date/Time   CHOL 120 10/06/2020 0743   TRIG 63 10/06/2020 0743   HDL 54 10/06/2020 0743   CHOLHDL 2.2 10/06/2020 0743   CHOLHDL 2 08/12/2018 0944   VLDL 19.2 08/12/2018 0944   LDLCALC 52 10/06/2020 0743   LDLDIRECT 67 07/15/2020 1609   LDLDIRECT 87.0 06/04/2019 0835    Physical Exam:   VS:  There were no vitals taken for this visit. , BMI There is no height or weight on file to calculate BMI. GENERAL:  Well appearing HEENT: Pupils equal round and reactive, fundi not visualized, oral mucosa unremarkable NECK:  No jugular venous distention, waveform within normal limits, carotid upstroke brisk and symmetric, no bruits, no thyromegaly LYMPHATICS:  No cervical adenopathy LUNGS:  Clear to auscultation bilaterally HEART:  RRR.  PMI not displaced or sustained,S1 and S2 within normal limits, no S3, no S4, no clicks, no rubs, no murmurs ABD:  Flat,  positive bowel sounds normal in frequency in pitch, no bruits, no rebound, no guarding, no midline pulsatile mass, no hepatomegaly, no splenomegaly EXT:  2 plus pulses throughout, no edema, no cyanosis no clubbing SKIN:  No rashes no nodules NEURO:  Cranial nerves II through XII grossly intact, motor grossly intact throughout PSYCH:  Cognitively intact, oriented to person place and time   ASSESSMENT/PLAN:    CAD/HLD, LDL goal less than 70- Stable with no anginal symptoms. No indication for ischemic evaluation.  Prior intolerance to atorvastatin, rosuvastatin, simvastatin with myalgias.  GDMT includes Praluent, Aspirin, Metoprolol. Heart healthy diet and regular cardiovascular exercise encouraged.    HFrEF / ICM - 05/13/20 LVEF 40-45%.  07/2021 LVEF 45-50%. GDMT includes Toprol (previous intolerance to Carvedilol), Entresto, Lasix, Jardiance. Heart healthy diet and regular cardiovascular exercise encouraged.  Educated to report weight gain of 2 lb overnight or 5 lb in 1 week.    HTN - Renal duplex 11/2022 with no stenosis.  Wearing CPAP regularly.  Secondary hypertension workup has been otherwise unremarkable. D  If BP at next follow-up is not at goal less than 130/80 will plan to refer for renal denervation.    Anxiety - Continue to follow with PCP.    OSA - CPAP compliance encouraged. Endorses wearing regularly.   Screening for Secondary Hypertension:     10/04/2021    9:43 AM  Causes  Drugs/Herbals Screened     - Comments limitis sodium and caffeine.  Uses Aleve bid.  Renovascular HTN Screened     - Comments No renal artery stenosis on CT 05/2021  Sleep Apnea Screened     - Comments uses BiPAP  Thyroid Disease Screened  Hyperaldosteronism Screened     - Comments No adenomas on CT in 2023  Pheochromocytoma Screened     - Comments No adenomas or symptoms.  Cushing's Syndrome N/A  Hyperparathyroidism Screened  Coarctation of the Aorta Screened     - Comments BP symmetric  Compliance  Screened    Relevant Labs/Studies:    Latest Ref Rng & Units 12/22/2022   11:57 AM 10/04/2022    4:04 PM 09/13/2022   10:27 AM  Basic Labs  Sodium 135 - 145 mmol/L 137  142  145   Potassium 3.5 - 5.1 mmol/L 3.9  4.4  4.9   Creatinine 0.44 - 1.00 mg/dL 1.61  0.96  0.45        Latest Ref Rng & Units 01/18/2021    8:46 AM 11/30/2020    8:22 AM  Thyroid   TSH 0.35 - 5.50 uIU/mL 1.26  3.57              Latest Ref Rng & Units 11/11/2011    1:14 PM  Cortisol  Cortisol  ug/dL 9.6        07/31/8117   10:09 AM  Renovascular   Renal Artery [place] Completed Yes      Disposition:    FU with PharmD in 4 months   Medication Adjustments/Labs and Tests Ordered: Current medicines are reviewed at length with the patient today.  Concerns regarding medicines are outlined above.  No orders of the defined types were placed in this encounter.  No orders of the defined types were placed in this encounter.   Signed, Alver Sorrow, NP  03/08/2023 10:44 AM    [facility]

## 2023-03-08 NOTE — Progress Notes (Signed)
Advanced Hypertension Assessment:    Date:  03/08/2023   ID:  Destiny Beasley, DOB 1955-12-22, MRN [id number]  PCP:  Daisy Floro, MD  Cardiologist:  Tonny Bollman, MD / Dr. [facility] Nephrologist:  Referring MD: Daisy Floro, MD   CC: Hypertension  History of Present Illness:    Destiny Beasley is a 67 y.o. female with a hx of CAD s/p inferior-lateral STEMI 06/2017 with DES to LCx, HFrEF, ICM, DM2, hyperthyroidism, TIA, OSA, breast cancer, HTN, HLD with statin intolerance, COVID19, pituitary tumor s/p resection, meningioma, anxiety  here to follow up in the [facility].   CAD dates back to inf-lat STEMI 06/2017 with DES to pLCx. Cardiac catheterization 04/2020 with patent LCx stent and pLAD with 40% stenosis. Her LVEF since her STEMI has been 40-45%.    Via telemedicine 08/2020 started on Lasix. Seen 10/08/20 with complaint of hot flashes which she attributed to Carvedilol. As leaving office developed chest pain and transported to ED - symptoms resolved with nitroglycerin and Xanax, Hs-troponin negative x2, d-dimer minimally elevated 0.62 though negative when adjusted for age. Head CT unremarkable.    ED visit 10/19/20 was COVID positive treated with antiviral. Visit 11/01/20, 12/14/20, and 12/28/20 with optimization of antihypertensive regimen.  Seen 01/07/21 and Sherryll Burger increased to 97-103mg  BID. Did not tolerate increased dose and reduce to 49-51mg  BID. 07/2021 she was started on doxazosin. Echo 07/2021 LVEF 45-50%, hypokinesis inferolateral wall, gr1DDtrivial MR. She was referred to [facility].   Established with [facility] 09/2021. Hypertension initially diagnosed in her 30s and has always been difficult to control. Enrolled in [employer]. Referred to [facility]. Metoprolol transitioned to Bisoprolol. She self discontinued after 2 weeks due to muscle pain.    11/03/21 Hydralazine 25mg  BID started by pharmacy team. She discontinued to to  reported chest pain. She was stared on Minoxidil 11/07/21. She self discontinued due to muscle pain, cramps. Seen 12/07/21 and Lasix increased to 40mg  for BP control.   At visit 09/21/22 Entresto increased on 97-103 milligrams twice daily.  Renal duplex ordered and performed 11/27/2022 with no evidence of RAS. At follow up 11/30/22 noted she was not tolerating the higher dose Entresto and had returned to 49-51mg  BID dose.    Presents today for preoperative clearance for bariatric surgery (gastric bypass). Exercising at [employer]. Retired in September. Reports no shortness of breath nor dyspnea on exertion. Reports no chest pain, pressure, or tightness. No edema, orthopnea, PND. Reports no palpitations.  Achieving >4 METS. Blood pressure at home most often 140s/80s. Prior intolerances limit titration of antihypertensive regimen. Notes she got a new BIPAP two days ago which she likes.  Previous antihypertensives: Amlodipine (muscle pain) Coreg (wheeze with 25mg  dose) Chlorthalidone (leg pain) Spironolactone (wheeze/fatigue) Jardiance (hallucinations, yeast infections) - now tolerating 10mg  dose Entresto (intolerant to 97-103mg  dose with cramps, tolerates 49-51mg  dose) Hydralazine (fatigue) Imdur (wheeze) Doxazosin (leg cramps)  Past Medical History:  Diagnosis Date   Anemia    Anxiety    CAD in native artery    a. Inf STEMI 06/2017 complicated by cardiogenic shock/complete heart block s/p emergent cath showing culprit large dominant LCx s/p aspiration thrombectomy and DES, EF 50% by cath, 40-45% by echo.   Chronic systolic heart failure (HCC) 06/25/2017   Echo 11/19: mild LVH, EF 40-45, inf-lat HK, Gr 1 DD, trivial MR   Complete heart block, transient (HCC)    a. 06/2017 in setting of acute inf MI -> resolved  after PCI.   COVID-19    Diabetes mellitus    Goiter    History of radiation therapy 06/14/2016 - 07/19/2016   Right Breast 50 Gy 25 fractions   Hypercholesteremia    Hypertension     Ischemic cardiomyopathy    a. EF 50% by cath and 40-45% by echo 06/2017.   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   Mening[facility])    Nuclear stress test    Nuclear stress test 10/19: EF 35, inferolateral, apical scar; inferior, apical inferior infarct with mild peri-infarct ischemia; high risk   Obesity    Personal history of chemotherapy    Personal history of radiation therapy    Pituitary tumor    Seasonal asthma    Sleep apnea    does not use every night   Stroke [facility])    TIA - Dec 1 st, 2017    Past Surgical History:  Procedure Laterality Date   ABDOMINAL HYSTERECTOMY     BRAIN MENINGIOMA EXCISION  2005   BREAST BIOPSY     BREAST LUMPECTOMY Right    2017   BREAST LUMPECTOMY WITH RADIOACTIVE SEED LOCALIZATION Right 04/20/2016   Procedure: RIGHT BREAST LUMPECTOMY WITH RADIOACTIVE SEED LOCALIZATION;  Surgeon: Claud Kelp, MD;  Location: [facility];  Service: General;  Laterality: Right;   BREAST REDUCTION SURGERY Bilateral 10/23/2016   Procedure: BILATERAL BREAST REDUCTION WITH LIPOSUCTION ASSISTANCE;  Surgeon: Louisa Second, MD;  Location: [facility];  Service: Plastics;  Laterality: Bilateral;   CESAREAN SECTION     CORONARY STENT INTERVENTION N/A 06/25/2017   Procedure: CORONARY STENT INTERVENTION;  Surgeon: Tonny Bollman, MD;  Location: [facility] INVASIVE CV LAB;  Service: Cardiovascular;  Laterality: N/A;   CORONARY/GRAFT ACUTE MI REVASCULARIZATION N/A 06/25/2017   Procedure: Coronary/Graft Acute MI Revascularization;  Surgeon: Tonny Bollman, MD;  Location: [facility] INVASIVE CV LAB;  Service: Cardiovascular;  Laterality: N/A;   FOOT SURGERY Bilateral 2016   hammer toe surgery   LEFT HEART CATH AND CORONARY ANGIOGRAPHY N/A 06/25/2017   Procedure: LEFT HEART CATH AND CORONARY ANGIOGRAPHY;  Surgeon: Tonny Bollman, MD;  Location: [facility] INVASIVE CV LAB;  Service:  Cardiovascular;  Laterality: N/A;   LEFT HEART CATH AND CORONARY ANGIOGRAPHY N/A 05/13/2020   Procedure: LEFT HEART CATH AND CORONARY ANGIOGRAPHY;  Surgeon: Runell Gess, MD;  Location: [facility] INVASIVE CV LAB;  Service: Cardiovascular;  Laterality: N/A;   MENISCUS REPAIR Left 2015   PITUITARY SURGERY     REDUCTION MAMMAPLASTY     RIGHT HEART CATH N/A 06/25/2017   Procedure: RIGHT HEART CATH;  Surgeon: Tonny Bollman, MD;  Location: [facility] INVASIVE CV LAB;  Service: Cardiovascular;  Laterality: N/A;   THYROID SURGERY      Current Medications: Current Meds  Medication Sig   ADVAIR HFA 115-21 MCG/ACT inhaler as needed.   ALPRAZolam (XANAX) 0.25 MG tablet Take 0.25 mg by mouth as needed.   aspirin EC 81 MG tablet Take 1 tablet (81 mg total) by mouth daily.   Continuous Blood Gluc Sensor (FREESTYLE LIBRE 3 SENSOR) MISC APPLY 1 SENSOR TO UPPER ARM  EVERY 14 DAYS FOR CONTINUOUS  GLUCOSE MONITORING   furosemide (LASIX) 40 MG tablet TAKE 1 TABLET(40 MG) BY MOUTH DAILY   glucose blood (FREESTYLE TEST STRIPS) test strip Use as instructed   LANTUS SOLOSTAR 100 UNIT/ML Solostar Pen INJECT SUBCUTANEOUSLY 70  UNITS  DAILY (Patient taking differently: Inject 64 Units into the skin daily.)   metoprolol succinate (TOPROL-XL) 50 MG 24 hr tablet TAKE 1 TABLET(50 MG) BY MOUTH DAILY WITH OR IMMEDIATELY FOLLOWING A MEAL   nitroGLYCERIN (NITROSTAT) 0.4 MG SL tablet DISSOLVE ONE TABLET UNDER TONGUE AS NEEDED FOR CHEST PAIN   ondansetron (ZOFRAN-ODT) 4 MG disintegrating tablet ondansetron 4 mg disintegrating tablet  TAKE 1 TABLET BY MOUTH EVERY 4 HOURS AS NEEDED FOR NAUSEA   PRALUENT 75 MG/ML SOAJ ADMINISTER 1 ML(75 MG) UNDER THE SKIN EVERY 14 DAYS   sacubitril-valsartan (ENTRESTO) 49-51 MG Take 1 tablet by mouth 2 (two) times daily.   valACYclovir (VALTREX) 1000 MG tablet Take 1,000 mg by mouth 2 (two) times daily as needed (infection).   venlafaxine XR (EFFEXOR-XR) 150 MG 24 hr capsule Take 150 mg by mouth daily.      Allergies:   Hydrocodone, Invokana [canagliflozin], Amlodipine, Carvedilol, Chlorthalidone, Doxazosin, Evolocumab, Hydralazine, Imdur [isosorbide nitrate], Jardiance [empagliflozin], Rosuvastatin, Semaglutide, Simvastatin, Spironolactone, Atorvastatin, and Compazine [prochlorperazine]   Social History   Socioeconomic History   Marital status: Single    Spouse name: Not on file   Number of children: Not on file   Years of education: Not on file   Highest education level: Bachelor's degree (e.g., BA, AB, BS)  Occupational History   Occupation: full time  Tobacco Use   Smoking status: Never   Smokeless tobacco: Never  Vaping Use   Vaping status: Never Used  Substance and Sexual Activity   Alcohol use: Not Currently    Comment: occ   Drug use: No   Sexual activity: Not on file    Comment: no cycles; pt had hyst  Other Topics Concern   Not on file  Social History Narrative   Lives alone   Right Handed   Drinks 1-2 cups caffeine daily   Social Determinants of Health   Financial Resource Strain: Low Risk  (10/04/2021)   Overall Financial Resource Strain (CARDIA)    Difficulty of Paying Living Expenses: Not hard at all  Food Insecurity: No Food Insecurity (10/04/2021)   Hunger Vital Sign    Worried About Running Out of Food in the Last Year: Never true    Ran Out of Food in the Last Year: Never true  Transportation Needs: No Transportation Needs (10/04/2021)   PRAPARE - Administrator, Civil Service (Medical): No    Lack of Transportation (Non-Medical): No  Physical Activity: Inactive (10/04/2021)   Exercise Vital Sign    Days of Exercise per Week: 0 days    Minutes of Exercise per Session: 0 min  Stress: Not on file  Social Connections: Not on file     Family History: The patient's family history includes Alcohol abuse in her brother; Diabetes in her mother; Heart attack in her mother; Hyperlipidemia in her mother; Hypertension in her mother and sister; Stroke  in her maternal uncle.  ROS:   Please see the history of present illness.     All other systems reviewed and are negative.  EKGs/Labs/Other Studies Reviewed:    EKG:  EKG independently reviewed 12/27/22 NSR 85 bpm with inferior TWI similar to previous.  Renal duplex 11/27/22   Right: Abnormal right Resistive Index. Normal size right kidney. RRV         flow present. Normal cortical thickness of right kidney. No         evidence of right renal artery stenosis.  Left:  Abnormal left Resisitve Index. Normal  size of left kidney.         LRV flow present. Normal cortical thickness of the left         kidney. No evidence of left renal artery stenosis.  Mesenteric:  Normal Superior Mesenteric artery and Celiac artery findings. Echo 08/17/21  1. Hypokinesis of the inferolateral wall with overall mild LV  dysfunction.   2. Left ventricular ejection fraction, by estimation, is 45 to 50%. The  left ventricle has mildly decreased function. The left ventricle  demonstrates regional wall motion abnormalities (see scoring  diagram/findings for description). Left ventricular  diastolic parameters are consistent with Grade I diastolic dysfunction  (impaired relaxation).   3. Right ventricular systolic function is normal. The right ventricular  size is normal.   4. Left atrial size was mildly dilated.   5. The mitral valve is normal in structure. Trivial mitral valve  regurgitation. No evidence of mitral stenosis.   6. The aortic valve is tricuspid. Aortic valve regurgitation is not  visualized. Aortic valve sclerosis is present, with no evidence of aortic  valve stenosis.   Comparison(s): EF 40%,mild hypokinesis of the left ventricular, mid-apical  inferior wall and inferolateral wall, trivial AI.  Echocardiogram 05/13/20 EF 40-45, inf and inf-lat HK, normal RVSF, trivial MR, trivial AI   Cardiac catheterization 05/13/20 LAD prox 40 LCx stent patent EF 45-50    ABIs 07/2019 Normal     Echocardiogram 09/05/2018 EF 40-45, inf-lat AK, mild LVH, Gr 1 DD, normal RVSF   Echo 03/07/18 Mild LVH, EF 40-45, GLS -11%, inf-lat HK, Gr 1 DD, trivial MR   Nuclear stress test 02/12/18 EF 35, inferolateral, apical scar; inferior, apical inferior infarct with mild peri-infarct ischemia; high risk   Echo 06/26/17 EF 40-45, inferolateral hypokinesis, mild MR   Cardiac catheterization 06/25/17 LM luminal irregularities LAD luminal irregularities, proximal 30 LCx proximal 100 RCA irregularities EF 45-50, inferolateral akinesis PCI: 4 x 20 mm Synergy DES to the proximal LCx   Echo 03/25/16 Moderate LVH, EF 60-65, normal wall motion   Carotid US 12/17 Bilateral ICA 1-39  Recent Labs: 09/13/2022: BNP 24.4 12/22/2022: BUN 12; Creatinine, Ser 0.86; Hemoglobin 14.2; Platelets 309; Potassium 3.9; Sodium 137   Recent Lipid Panel    Component Value Date/Time   CHOL 120 10/06/2020 0743   TRIG 63 10/06/2020 0743   HDL 54 10/06/2020 0743   CHOLHDL 2.2 10/06/2020 0743   CHOLHDL 2 08/12/2018 0944   VLDL 19.2 08/12/2018 0944   LDLCALC 52 10/06/2020 0743   LDLDIRECT 67 07/15/2020 1609   LDLDIRECT 87.0 06/04/2019 0835    Physical Exam:   VS:  BP (!) 148/86   Pulse 86   Ht 6' (1.829 m)   Wt 263 lb 1.6 oz (119.3 kg)   SpO2 99%   BMI 35.68 kg/m  , BMI Body mass index is 35.68 kg/m. GENERAL:  Well appearing HEENT: Pupils equal round and reactive, fundi not visualized, oral mucosa unremarkable NECK:  No jugular venous distention, waveform within normal limits, carotid upstroke brisk and symmetric, no bruits, no thyromegaly LYMPHATICS:  No cervical adenopathy LUNGS:  Clear to auscultation bilaterally HEART:  RRR.  PMI not displaced or sustained,S1 and S2 within normal limits, no S3, no S4, no clicks, no rubs, no murmurs ABD:  Flat, positive bowel sounds normal in frequency in pitch, no bruits, no rebound, no guarding, no midline pulsatile mass, no hepatomegaly, no splenomegaly EXT:  2  plus pulses throughout, no edema, no cyanosis no clubbing SKIN:  No rashes no nodules NEURO:  Cranial nerves II through XII grossly intact, motor grossly intact throughout PSYCH:  Cognitively intact, oriented to person place and time   ASSESSMENT/PLAN:    Preop clearance - pending bariatric surgery. May hold Aspirin 5-7 days prior. According to the Revised Cardiac Risk Index (RCRI), her Perioperative Risk of Major Cardiac Event is (%): 11. Her Functional Capacity in METs is: 8.33 according to the Duke Activity Status Index (DASI). Per AHA/ACC guidelines, she is deemed acceptable risk for the planned procedure without additional cardiovascular testing. Will route to surgical team so they are aware.    CAD/HLD, LDL goal less than 70- Stable with no anginal symptoms. No indication for ischemic evaluation.  Prior intolerance to atorvastatin, rosuvastatin, simvastatin with myalgias.  GDMT includes Praluent, Aspirin, Metoprolol. Heart healthy diet and regular cardiovascular exercise encouraged.    HFrEF / ICM - 05/13/20 LVEF 40-45%.  07/2021 LVEF 45-50%. GDMT includes Toprol (previous intolerance to Carvedilol), Entresto, Lasix, Jardiance. Heart healthy diet and regular cardiovascular exercise encouraged.  Educated to report weight gain of 2 lb overnight or 5 lb in 1 week.    HTN - Renal duplex 11/2022 with no stenosis.  Wearing CPAP regularly.  Secondary hypertension workup has been otherwise unremarkable. Multipl eprior intolerances, detailed above, limit further medication titration. Initiated discussion regarding RDN, consider when procedure resumed. Also anticipate BP control will improve post bariatric surgery. Continue present antihypertensive regimen.    Anxiety - Continue to follow with PCP.    OSA - BIPAP compliance encouraged. Endorses wearing regularly.   Screening for Secondary Hypertension:     10/04/2021    9:43 AM  Causes  Drugs/Herbals Screened     - Comments limitis sodium and  caffeine.  Uses Aleve bid.  Renovascular HTN Screened     - Comments No renal artery stenosis on CT 05/2021  Sleep Apnea Screened     - Comments uses BiPAP  Thyroid Disease Screened  Hyperaldosteronism Screened     - Comments No adenomas on CT in 2023  Pheochromocytoma Screened     - Comments No adenomas or symptoms.  Cushing's Syndrome N/A  Hyperparathyroidism Screened  Coarctation of the Aorta Screened     - Comments BP symmetric  Compliance Screened    Relevant Labs/Studies:    Latest Ref Rng & Units 12/22/2022   11:57 AM 10/04/2022    4:04 PM 09/13/2022   10:27 AM  Basic Labs  Sodium 135 - 145 mmol/L 137  142  145   Potassium 3.5 - 5.1 mmol/L 3.9  4.4  4.9   Creatinine 0.44 - 1.00 mg/dL 7.84  6.96  2.95        Latest Ref Rng & Units 01/18/2021    8:46 AM 11/30/2020    8:22 AM  Thyroid   TSH 0.35 - 5.50 uIU/mL 1.26  3.57              Latest Ref Rng & Units 11/11/2011    1:14 PM  Cortisol  Cortisol  ug/dL 9.6        2/84/1324   10:09 AM  Renovascular   Renal Artery [place] Completed Yes      Disposition:    FU with PharmD in 4 months   Medication Adjustments/Labs and Tests Ordered: Current medicines are reviewed at length with the patient today.  Concerns regarding medicines are outlined above.  No orders of the defined types were placed in this encounter.  No orders of the defined  types were placed in this encounter.   Signed, Alver Sorrow, NP  03/08/2023 2:22 PM    [facility]

## 2023-03-09 DIAGNOSIS — Z6836 Body mass index (BMI) 36.0-36.9, adult: Secondary | ICD-10-CM | POA: Diagnosis not present

## 2023-03-09 DIAGNOSIS — E119 Type 2 diabetes mellitus without complications: Secondary | ICD-10-CM | POA: Diagnosis not present

## 2023-03-09 DIAGNOSIS — K1120 Sialoadenitis, unspecified: Secondary | ICD-10-CM | POA: Diagnosis not present

## 2023-03-13 DIAGNOSIS — G4733 Obstructive sleep apnea (adult) (pediatric): Secondary | ICD-10-CM | POA: Diagnosis not present

## 2023-03-13 DIAGNOSIS — I10 Essential (primary) hypertension: Secondary | ICD-10-CM | POA: Diagnosis not present

## 2023-03-13 DIAGNOSIS — E1165 Type 2 diabetes mellitus with hyperglycemia: Secondary | ICD-10-CM | POA: Diagnosis not present

## 2023-03-13 DIAGNOSIS — E66812 Obesity, class 2: Secondary | ICD-10-CM | POA: Diagnosis not present

## 2023-03-13 DIAGNOSIS — Z6835 Body mass index (BMI) 35.0-35.9, adult: Secondary | ICD-10-CM | POA: Diagnosis not present

## 2023-03-15 ENCOUNTER — Ambulatory Visit (HOSPITAL_COMMUNITY)
Admission: RE | Admit: 2023-03-15 | Discharge: 2023-03-15 | Disposition: A | Discharge status: Home / Self Care | Payer: PPO | Source: Ambulatory Visit | Attending: General Surgery | Admitting: General Surgery

## 2023-03-15 ENCOUNTER — Encounter (HOSPITAL_COMMUNITY)
Admission: RE | Admit: 2023-03-15 | Discharge: 2023-03-15 | Disposition: A | Discharge status: Home / Self Care | Payer: PPO | Source: Ambulatory Visit | Attending: General Surgery | Admitting: General Surgery

## 2023-03-15 ENCOUNTER — Other Ambulatory Visit (HOSPITAL_COMMUNITY): Payer: Self-pay | Admitting: General Surgery

## 2023-03-15 ENCOUNTER — Other Ambulatory Visit: Payer: Self-pay

## 2023-03-15 DIAGNOSIS — R110 Nausea: Secondary | ICD-10-CM | POA: Diagnosis not present

## 2023-03-15 DIAGNOSIS — R109 Unspecified abdominal pain: Secondary | ICD-10-CM | POA: Diagnosis not present

## 2023-03-15 DIAGNOSIS — K224 Dyskinesia of esophagus: Secondary | ICD-10-CM | POA: Diagnosis not present

## 2023-03-15 DIAGNOSIS — K838 Other specified diseases of biliary tract: Secondary | ICD-10-CM | POA: Diagnosis not present

## 2023-03-15 DIAGNOSIS — E66812 Morbid (severe) obesity due to excess calories: Secondary | ICD-10-CM

## 2023-03-15 DIAGNOSIS — Z01818 Encounter for other preprocedural examination: Secondary | ICD-10-CM | POA: Insufficient documentation

## 2023-03-20 ENCOUNTER — Ambulatory Visit (HOSPITAL_COMMUNITY)
Admission: RE | Admit: 2023-03-20 | Discharge: 2023-03-20 | Disposition: A | Discharge status: Home / Self Care | Payer: PPO | Source: Ambulatory Visit | Attending: Family Medicine | Admitting: Family Medicine

## 2023-03-20 ENCOUNTER — Other Ambulatory Visit (HOSPITAL_COMMUNITY): Payer: Self-pay | Admitting: Family Medicine

## 2023-03-20 DIAGNOSIS — K1120 Sialoadenitis, unspecified: Secondary | ICD-10-CM | POA: Insufficient documentation

## 2023-03-20 DIAGNOSIS — R221 Localized swelling, mass and lump, neck: Secondary | ICD-10-CM | POA: Diagnosis not present

## 2023-03-20 DIAGNOSIS — Z6836 Body mass index (BMI) 36.0-36.9, adult: Secondary | ICD-10-CM | POA: Diagnosis not present

## 2023-03-26 ENCOUNTER — Encounter: Payer: Self-pay | Admitting: Dietician

## 2023-03-26 ENCOUNTER — Other Ambulatory Visit: Payer: Self-pay | Admitting: Family Medicine

## 2023-03-26 ENCOUNTER — Encounter: Payer: PPO | Attending: General Surgery | Admitting: Dietician

## 2023-03-26 VITALS — Ht 71.5 in | Wt 262.3 lb

## 2023-03-26 DIAGNOSIS — E1129 Type 2 diabetes mellitus with other diabetic kidney complication: Secondary | ICD-10-CM | POA: Diagnosis not present

## 2023-03-26 DIAGNOSIS — R600 Localized edema: Secondary | ICD-10-CM

## 2023-03-26 DIAGNOSIS — E669 Obesity, unspecified: Secondary | ICD-10-CM | POA: Insufficient documentation

## 2023-03-26 NOTE — Progress Notes (Signed)
Nutrition Assessment for Bariatric Surgery: Pre-Surgery Behavioral and Nutrition Intervention Program   Medical Nutrition Therapy  Appt Start Time: 11:10    End Time: 12:08  Patient was seen on 03/26/2023 for Pre-Operative Nutrition Assessment. Purpose of todays visit  enhance perioperative outcomes along with a healthy weight maintenance   Referral stated Supervised Weight Loss (SWL) visits needed: 6 months  Planned surgery: RYGB Pt expectation of surgery: get off insulin  NUTRITION ASSESSMENT   Anthropometrics  Start weight at [facility]: 262.3 lbs (date: 03/26/2023)  Height: 71.5 in BMI: 36.07 kg/m2     Clinical   Pharmacotherapy: History of weight loss medication used: Mounjaro (not tolerated), Ozempic (not tolerated)  Medical hx: cancer, T2DM, HTN, asthma, obesity, sleep apnea, heart attack Medications: Praluent, entresol, metoprolol succinate, furosemide, venlafaxine XR, Lantuss, alprazolam, Advair  Labs: A1c 9.2 Notable signs/symptoms: none noted Any previous deficiencies? No  Evaluation of Nutritional Deficiencies: Micronutrient Nutrition Focused Physical Exam: Hair: No issues observed Eyes: No issues observed Mouth: No issues observed Neck: No issues observed Nails: No issues observed Skin: No issues observed  Lifestyle & Dietary Hx   Pt states she retired in October.  Current Physical Activity Recommendations state 150 minutes per week of moderate to vigorous movement including Cardio and 1-2 days of resistance activities as well as flexibility/balance activities:  Pts current physical activity: gym 3 days a week for 90, with 100% recommendation reached   Sleep Hygiene: duration and quality: good once pt is asleep; pt states she uses a CPAP/BYPAP  Current Patient Perceived Stress Level as stated by pt on a scale of 1-10:  1       Stress Management Techniques: breathing when having an anxiety attacks.  According to the Dietary Guidelines for Americans  Recommendation: equivalent 1.5-2 cups fruits per day, equivalent 2-3 cups vegetables per day and at least half all grains whole  Fruit servings per day (on average): 2, meeting 100% recommendation  Non-starchy vegetable servings per day (on average): 2, meeting 66-100% recommendation  Whole Grains per day (on average): 2  Number of meals missed/skipped per week out of 21: 10  24-Hr Dietary Recall First Meal: skip or protein shake Snack:  Second Meal: apple or [place] sandwich with cheese or jumblia Snack:  Third Meal: meat, vegetable Snack: popcorn, apple Beverages: water, Fairlife Protein shake, body armour lite  Alcoholic beverages per week: 0-1 (2 beers a month socially)   Estimated Energy Needs Calories: 1600  NUTRITION DIAGNOSIS  Overweight/obesity ([name]-3.3) related to past poor dietary habits and physical inactivity as evidenced by patient w/ planned RYGB surgery following dietary guidelines for continued weight loss.  NUTRITION INTERVENTION  Nutrition counseling (C-1) and education (E-2) to facilitate bariatric surgery goals.  Educated pt on micronutrient deficiencies post-surgery and behavioral/dietary strategies to start in order to mitigate that risk   Behavioral and Dietary Interventions Pre-Op Goals Reviewed with the Patient Nutrition: Healthy Eating Behaviors Switch to non-caloric, non-carbonated and non-caffeinated beverages such as  water, unsweetened tea, Crystal Light and zero calorie beverages (aim for 64 oz. per day) Cut out grazing between meals or at night  Find a protein shake you like Eat every 3-5 hours        Eliminate distractions while eating (TV, computer, reading, driving, texting) Take 16-10 minutes to eat a meal  Decrease high sugar foods/decrease high fat/fried foods Eliminate alcoholic beverages Increase protein intake (eggs, fish, chicken, yogurt) before surgery Eat non starchy vegetables 2 times a day 7 days a week Eat complex  carbohydrates  such as whole grains and fruits   Behavioral Modification: Physical Activity Increase my usual daily activity (use stairs, park farther, etc.) Engage in _______________________  activity  _______ minutes ______ times per week  Other:    _________________________________________________________________     Problem Solving I will think about my usual eating patterns and how to tweak them How can my friends and family support me Barriers to starting my changes Learn and understand appetite verses hunger   Healthy Coping Allow for ___________ activities per week to help me manage stress Reframe negative thoughts I will keep a picture of someone or something that is my inspiration & look at it daily   Monitoring  Weigh myself once a week  Measure my progress by monitoring how my clothes fit Keep a food record of what I eat and drink for the next ________ (time period) Take pictures of what I eat and drink for the next ________ (time period) Use an app to count steps/day for the next_______ (time period) Measure my progress such as increased energy and more restful sleep Monitor your acid reflux and bowel habits, are they getting better?   *Goals that are bolded indicate the pt would like to start working towards these  Handouts Provided Include  Bariatric Surgery handouts (Nutrition Visits, Pre Surgery Behavioral Change Goals, Protein Shakes Brands to Choose From, Vitamins & Mineral Supplementation)  Learning Style & Readiness for Change Teaching method utilized: Visual, Auditory, and hands on  Demonstrated degree of understanding via: Teach Back  Readiness Level: preparation Barriers to learning/adherence to lifestyle change: nothing identified  RD's Notes for Next Visit Patient progress toward chosen goals    MONITORING & EVALUATION Dietary intake, weekly physical activity, body weight, and preoperative behavioral change goals   Next Steps  Patient is to follow up at [facility] in  two weeks for first SWL visit.

## 2023-04-04 DIAGNOSIS — G4733 Obstructive sleep apnea (adult) (pediatric): Secondary | ICD-10-CM | POA: Diagnosis not present

## 2023-04-12 ENCOUNTER — Encounter: Payer: PPO | Attending: General Surgery | Admitting: Dietician

## 2023-04-12 ENCOUNTER — Encounter: Payer: Self-pay | Admitting: Dietician

## 2023-04-12 VITALS — Ht 71.5 in | Wt 260.7 lb

## 2023-04-12 DIAGNOSIS — Z794 Long term (current) use of insulin: Secondary | ICD-10-CM | POA: Insufficient documentation

## 2023-04-12 DIAGNOSIS — E669 Obesity, unspecified: Secondary | ICD-10-CM | POA: Insufficient documentation

## 2023-04-12 DIAGNOSIS — E1169 Type 2 diabetes mellitus with other specified complication: Secondary | ICD-10-CM | POA: Insufficient documentation

## 2023-04-12 NOTE — Progress Notes (Signed)
Supervised Weight Loss Visit Bariatric Nutrition Education Appt Start Time: 2:01    End Time: 2:20  Planned surgery: RYGB Pt expectation of surgery: get off insulin  1 out of 6 SWL Appointments   NUTRITION ASSESSMENT   Anthropometrics  Start weight at [facility]: 262.3 lbs (date: 03/26/2023)  Height: 71.5 in Weight today: 260.7 lb  BMI: 35.85 kg/m2     Clinical   Pharmacotherapy: History of weight loss medication used: Mounjaro (not tolerated), Ozempic (not tolerated)  Medical hx: cancer, T2DM, HTN, asthma, obesity, sleep apnea, heart attack Medications: Praluent, entresol, metoprolol succinate, furosemide, venlafaxine XR, Lantuss, alprazolam, Advair  Labs: A1c 9.2 Notable signs/symptoms: none noted Any previous deficiencies? No  Lifestyle & Dietary Hx  Pt states she is trying to slow down at meal, stating she is not skipping meals right now. Pt states she doesn't normally drink with her meals.  Estimated daily fluid intake: 64 oz Supplements: multivitamin daily Current average weekly physical activity: gym 3 days a week for 90 minutes  24-Hr Dietary Recall First Meal: skip or protein shake Snack:  Second Meal: apple or [place] sandwich with cheese or jumblia Snack:  Third Meal: meat, vegetable Snack: popcorn, apple Beverages: water, Fairlife Protein shake, body armour lite  Estimated Energy Needs Calories: 1600   NUTRITION DIAGNOSIS  Overweight/obesity ([place]-3.3) related to past poor dietary habits and physical inactivity as evidenced by patient w/ planned RYGB surgery following dietary guidelines for continued weight loss.  NUTRITION INTERVENTION  Nutrition counseling (C-1) and education (E-2) to facilitate bariatric surgery goals.  Encouraged patient to honor their body's internal hunger and fullness cues.  Throughout the day, check in mentally and rate hunger. Stop eating when satisfied not full regardless of how much food is left on the plate.  Get more if still  hungry 20-30 minutes later.  The key is to honor satisfaction so throughout the meal, rate fullness factor and stop when comfortably satisfied not physically full. The key is to honor hunger and fullness without any feelings of guilt or shame.  Pay attention to what the internal cues are, rather than any external factors. This will enhance the confidence you have in listening to your own body and following those internal cues enabling you to increase how often you eat when you are hungry not out of appetite and stop when you are satisfied not full.  Encouraged pt to continue to drink a minium 64 fluid ounces with half being plain water to satisfy proper hydration    Pre-Op Goals Reviewed with the Patient Nutrition: Healthy Eating Behaviors Switch to non-caloric, non-carbonated and non-caffeinated beverages such as  water, unsweetened tea, Crystal Light and zero calorie beverages (aim for 64 oz. per day) Cut out grazing between meals or at night  Find a protein shake you like Eat every 3-5 hours        Eliminate distractions while eating (TV, computer, reading, driving, texting) Take 21-30 minutes to eat a meal  Decrease high sugar foods/decrease high fat/fried foods Eliminate alcoholic beverages Increase protein intake (eggs, fish, chicken, yogurt) before surgery Eat non starchy vegetables 2 times a day 7 days a week Eat complex carbohydrates such as whole grains and fruits   Behavioral Modification: Physical Activity Increase my usual daily activity (use stairs, park farther, etc.) Engage in _______________________  activity  _______ minutes ______ times per week  Other:    _________________________________________________________________     Problem Solving I will think about my usual eating patterns and how to tweak  them How can my friends and family support me Barriers to starting my changes Learn and understand appetite verses hunger   Healthy Coping Allow for ___________ activities  per week to help me manage stress Reframe negative thoughts I will keep a picture of someone or something that is my inspiration & look at it daily   Monitoring  Weigh myself once a week  Measure my progress by monitoring how my clothes fit Keep a food record of what I eat and drink for the next ________ (time period) Take pictures of what I eat and drink for the next ________ (time period) Use an app to count steps/day for the next_______ (time period) Measure my progress such as increased energy and more restful sleep Monitor your acid reflux and bowel habits, are they getting better?   *Goals that are bolded indicate the pt would like to start working towards these  Pre-Op Goals Progress & New Goals Continue: eat to eat every 3 to 5 hours; avoid skipping meals Re-engage: taking 20-30 minutes to eat New: wait 30 minutes after eating to drink fluids (stop 15 minutes before, during and 30 minutes after); Sip in between meals and snacks (no guzzling) New: aim for satisfaction before fullness; get rid of distractions; listen to our body  Handouts Provided Include  Goals printed  Learning Style & Readiness for Change Teaching method utilized: Visual & Auditory  Demonstrated degree of understanding via: Teach Back  Readiness Level: preparation Barriers to learning/adherence to lifestyle change: nothing identified  RD's Notes for next Visit  Patient progress toward chosen goals  MONITORING & EVALUATION Dietary intake, weekly physical activity, body weight, and pre-op goals in 1 month.   Next Steps  Patient is to return to [facility] in 1 month for next SWL visit.

## 2023-04-30 ENCOUNTER — Other Ambulatory Visit: Payer: PPO

## 2023-05-03 ENCOUNTER — Other Ambulatory Visit (HOSPITAL_COMMUNITY): Payer: Self-pay

## 2023-05-03 ENCOUNTER — Telehealth: Payer: Self-pay | Admitting: Pharmacy Technician

## 2023-05-03 ENCOUNTER — Telehealth (HOSPITAL_BASED_OUTPATIENT_CLINIC_OR_DEPARTMENT_OTHER): Payer: Self-pay | Admitting: *Deleted

## 2023-05-03 ENCOUNTER — Telehealth: Payer: Self-pay | Admitting: Cardiovascular Disease

## 2023-05-03 ENCOUNTER — Other Ambulatory Visit: Payer: Self-pay | Admitting: Cardiovascular Disease

## 2023-05-03 ENCOUNTER — Encounter (HOSPITAL_BASED_OUTPATIENT_CLINIC_OR_DEPARTMENT_OTHER): Payer: Self-pay

## 2023-05-03 NOTE — Telephone Encounter (Signed)
 Pt c/o medication issue:  1. Name of Medication: PRALUENT  75 MG/ML SOAJ   2. How are you currently taking this medication (dosage and times per day)?   3. Are you having a reaction (difficulty breathing--STAT)? no  4. What is your medication issue? States the prior shara was deny by insurance, calling our office to see if we are going to do an appeal. Please advise

## 2023-05-03 NOTE — Telephone Encounter (Signed)
 Pharmacy Patient Advocate Encounter   Received notification from Pt Calls Messages that prior authorization for praluent  is required/requested.   Insurance verification completed.   The patient is insured through [facility] ADVANTAGE/RX ADVANCE .   Per test claim: PA required; PA submitted to above mentioned insurance via Fax Key/confirmation #/EOC faxed over documentation Status is pending

## 2023-05-03 NOTE — Telephone Encounter (Signed)
 Patient sent in [place] message regarding PA for Praulent  Will forward to PA team to initiate

## 2023-05-04 ENCOUNTER — Other Ambulatory Visit: Payer: Self-pay | Admitting: Obstetrics and Gynecology

## 2023-05-04 DIAGNOSIS — Z1231 Encounter for screening mammogram for malignant neoplasm of breast: Secondary | ICD-10-CM

## 2023-05-04 NOTE — Telephone Encounter (Signed)
 See another phone note from 1/9

## 2023-05-05 ENCOUNTER — Other Ambulatory Visit: Payer: PPO

## 2023-05-07 ENCOUNTER — Ambulatory Visit (INDEPENDENT_AMBULATORY_CARE_PROVIDER_SITE_OTHER): Payer: PPO | Admitting: Licensed Clinical Social Worker

## 2023-05-07 DIAGNOSIS — F4322 Adjustment disorder with anxiety: Secondary | ICD-10-CM
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
## 2023-05-08 ENCOUNTER — Other Ambulatory Visit (HOSPITAL_COMMUNITY): Payer: Self-pay

## 2023-05-10 ENCOUNTER — Encounter: Payer: Self-pay | Admitting: Dietician

## 2023-05-10 ENCOUNTER — Encounter: Payer: PPO | Attending: General Surgery | Admitting: Dietician

## 2023-05-10 VITALS — Ht 71.5 in | Wt 262.8 lb

## 2023-05-10 DIAGNOSIS — I2510 Atherosclerotic heart disease of native coronary artery without angina pectoris: Secondary | ICD-10-CM | POA: Diagnosis not present

## 2023-05-10 DIAGNOSIS — Z794 Long term (current) use of insulin: Secondary | ICD-10-CM | POA: Diagnosis not present

## 2023-05-10 DIAGNOSIS — E66812 Obesity, class 2: Secondary | ICD-10-CM | POA: Diagnosis present

## 2023-05-10 DIAGNOSIS — G4733 Obstructive sleep apnea (adult) (pediatric): Secondary | ICD-10-CM | POA: Diagnosis not present

## 2023-05-10 DIAGNOSIS — Z713 Dietary counseling and surveillance: Secondary | ICD-10-CM | POA: Diagnosis not present

## 2023-05-10 DIAGNOSIS — R110 Nausea: Secondary | ICD-10-CM | POA: Insufficient documentation

## 2023-05-10 DIAGNOSIS — E669 Obesity, unspecified: Secondary | ICD-10-CM | POA: Insufficient documentation

## 2023-05-10 DIAGNOSIS — E1129 Type 2 diabetes mellitus with other diabetic kidney complication: Secondary | ICD-10-CM | POA: Insufficient documentation

## 2023-05-10 DIAGNOSIS — I10 Essential (primary) hypertension: Secondary | ICD-10-CM | POA: Diagnosis not present

## 2023-05-10 DIAGNOSIS — R809 Proteinuria, unspecified: Secondary | ICD-10-CM | POA: Diagnosis not present

## 2023-05-10 NOTE — Progress Notes (Signed)
Supervised Weight Loss Visit Bariatric Nutrition Education Appt Start Time: 1400    End Time: 1421  Planned surgery: RYGB Pt expectation of surgery: get off insulin  2 out of 6 SWL Appointments   NUTRITION ASSESSMENT   Anthropometrics  Start weight at [facility]: 262.3 lbs (date: 03/26/2023)  Height: 71.5 in Weight today: 262.8 lb  BMI: 36.14kg/m2     Clinical   Pharmacotherapy: History of weight loss medication used: Mounjaro (not tolerated), Ozempic (not tolerated)  Medical hx: cancer, T2DM, HTN, asthma, obesity, sleep apnea, heart attack Medications: Praluent, entresol, metoprolol succinate, furosemide, venlafaxine XR, Lantuss, alprazolam, Advair  Labs: A1c 9.2 Notable signs/symptoms: none noted Any previous deficiencies? No  Lifestyle & Dietary Hx  Pt states she is sipping instead of gulping liquids and is up to 64+ oz/day.   Pt states she is still taking her MVI; she does not have any questions on vitamins today.  Pt states she is still working out 90 min a day 3 times a week.   Pt states she has takes protein drinks to make sure she is getting 60g of protein a day.   Pt states she is trying to slow down at meal, stating she is not skipping meals right now. Pt states she doesn't normally drink with her meals.  Estimated daily fluid intake: 64 oz Supplements: multivitamin daily Current average weekly physical activity: gym 3 days a week for 90 minutes  24-Hr Dietary Recall First Meal: Protein drink, 2 pieces of toast w/ butter with coffee sugar free creamer and 2 splenda packets Snack: Protein drink Second Meal: apple or [place] sandwich with cheese or jumblia OR Kale with onions with meatloaf Snack:  Third Meal: baked chicken with  sour sauce and vegetables OR salad Snack: popcorn, individual serving of ice cream, chips, apple crisps, mixed fruit Beverages: water Estimated Energy Needs Calories: 1600   NUTRITION DIAGNOSIS  Overweight/obesity ([place]-3.3) related  to past poor dietary habits and physical inactivity as evidenced by patient w/ planned RYGB surgery following dietary guidelines for continued weight loss.  NUTRITION INTERVENTION  Nutrition counseling (C-1) and education (E-2) to facilitate bariatric surgery goals.  Encouraged patient to honor their body's internal hunger and fullness cues.  Throughout the day, check in mentally and rate hunger. Stop eating when satisfied not full regardless of how much food is left on the plate.  Get more if still hungry 20-30 minutes later.  The key is to honor satisfaction so throughout the meal, rate fullness factor and stop when comfortably satisfied not physically full. The key is to honor hunger and fullness without any feelings of guilt or shame.  Pay attention to what the internal cues are, rather than any external factors. This will enhance the confidence you have in listening to your own body and following those internal cues enabling you to increase how often you eat when you are hungry not out of appetite and stop when you are satisfied not full.  Encouraged pt to continue to drink a minium 64 fluid ounces with half being plain water to satisfy proper hydration    Purpose of protein: Every cell in your body has protein. Protein is essential for the structure, function and regulation of tissues and organs within the body. Without protein enzymes and antibodies would not exist, and cells would lack storage, transportation, and messenger systems. According to [facility]. [employer], the body is made up of at least 10000 different proteins. Lack of protein can lead to  growth failure in children, loss of muscle mass, decreased immune system function, and overall weakening of various organs in the body.  [employer]nl, [employer].com.cy, PokerProtocol.pl   Pre-Op Goals  Progress & New Goals Continue: eat to eat every 3 to 5 hours; avoid skipping meals Re-engage: taking 20-30 minutes to eat  wait 30 minutes after eating to drink fluids (stop 15 minutes before, during and 30 minutes after); Sip in between meals and snacks (no guzzling)  aim for satisfaction before fullness; get rid of distractions; listen to our body Continue to work on wait 30 minutes after eating to drink fluids (stop 15 minutes before, during and 30 minutes) New : meal planning, plan to have a protein with every meal and snack: use container for meal prepping.   Handouts Provided Include  Bariatric Myplate  Learning Style & Readiness for Change Teaching method utilized: Visual & Auditory  Demonstrated degree of understanding via: Teach Back  Readiness Level: preparation Barriers to learning/adherence to lifestyle change: nothing identified  RD's Notes for next Visit  Patient progress toward chosen goals  MONITORING & EVALUATION Dietary intake, weekly physical activity, body weight, and pre-op goals in 1 month.   Next Steps  Patient is to return to [facility] in 1 month for next SWL visit.

## 2023-05-14 ENCOUNTER — Other Ambulatory Visit (HOSPITAL_COMMUNITY): Payer: Self-pay

## 2023-05-14 NOTE — Telephone Encounter (Signed)
Pharmacy Patient Advocate Encounte    Test Claim  to check status   Insurance verification completed.   The patient is insured through [facility] [employer] .   Per test claim: Refill too soon. PA is not needed at this time. Medication was filled 05/08/23. Next eligible fill date is 07/10/23.   Approved and waiting on approval letter

## 2023-05-15 ENCOUNTER — Ambulatory Visit
Admission: RE | Admit: 2023-05-15 | Discharge: 2023-05-15 | Disposition: A | Discharge status: Home / Self Care | Payer: PPO | Source: Ambulatory Visit | Attending: Family Medicine | Admitting: Family Medicine

## 2023-05-15 DIAGNOSIS — R600 Localized edema: Secondary | ICD-10-CM

## 2023-05-15 MED ORDER — GADOPICLENOL 0.5 MMOL/ML IV SOLN
10.0000 mL | Freq: Once | INTRAVENOUS | Status: AC | PRN
  Administered 2023-05-15: 10 mL via INTRAVENOUS

## 2023-06-04 ENCOUNTER — Encounter: Payer: Self-pay | Admitting: Dietician

## 2023-06-04 ENCOUNTER — Encounter: Payer: PPO | Attending: General Surgery | Admitting: Dietician

## 2023-06-04 ENCOUNTER — Ambulatory Visit
Admission: RE | Admit: 2023-06-04 | Discharge: 2023-06-04 | Disposition: A | Discharge status: Home / Self Care | Payer: PPO | Source: Ambulatory Visit | Attending: Obstetrics and Gynecology | Admitting: Obstetrics and Gynecology

## 2023-06-04 VITALS — Ht 71.5 in | Wt 260.0 lb

## 2023-06-04 DIAGNOSIS — Z1231 Encounter for screening mammogram for malignant neoplasm of breast: Secondary | ICD-10-CM

## 2023-06-04 DIAGNOSIS — E119 Type 2 diabetes mellitus without complications: Secondary | ICD-10-CM | POA: Insufficient documentation

## 2023-06-04 DIAGNOSIS — E669 Obesity, unspecified: Secondary | ICD-10-CM | POA: Diagnosis present

## 2023-06-04 NOTE — Progress Notes (Signed)
 Supervised Weight Loss Visit Bariatric Nutrition Education Appt Start Time: 1450    End Time: 1507  Planned surgery: RYGB Pt expectation of surgery: get off insulin   3 out of 6 SWL Appointments   NUTRITION ASSESSMENT   Anthropometrics  Start weight at [facility]: 262.3 lbs (date: 03/26/2023) Height: 71.5 in Weight today: 260 lb  BMI: 35.76 kg/m2     Clinical   Pharmacotherapy: History of weight loss medication used: Mounjaro  (not tolerated), Ozempic  (not tolerated)  Medical hx: cancer, T2DM, HTN, asthma, obesity, sleep apnea, heart attack Medications: Praluent , entresol, metoprolol  succinate, furosemide , venlafaxine  XR, Lantuss, alprazolam , Advair   Labs: A1c 9.2 Notable signs/symptoms: none noted Any previous deficiencies? 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 Pt states Monjauro has been curving her appetite.  Estimated daily fluid intake: 64 oz Supplements: multivitamin daily Current average weekly physical activity: gym 4 days a week for 120 minutes  No lunch no breakfast 24-Hr Dietary Recall First Meal: skipped or Protein drink, 2 pieces of toast w/ butter with coffee sugar free creamer and 2 splenda packets Snack: Protein drink Second Meal: skipped or apple or [place] sandwich with cheese or jumblia OR Kale with onions with meatloaf Snack: cold stone ice cream Third Meal:  chicken breast baked chicken with  sour sauce and vegetables OR salad Snack: popcorn, individual serving of ice cream, chips, apple crisps, mixed fruit Beverages: water, coffee with Splenda and sugar free creamer  Estimated Energy Needs Calories: 1600  NUTRITION DIAGNOSIS  Overweight/obesity ([place]-3.3) related to past poor dietary habits and physical inactivity as evidenced by patient w/ planned RYGB surgery following dietary guidelines for continued weight loss.  NUTRITION INTERVENTION   Nutrition counseling (C-1) and education (E-2) to facilitate bariatric surgery goals.  Encouraged patient to honor their body's internal hunger and fullness cues.  Throughout the day, check in mentally and rate hunger. Stop eating when satisfied not full regardless of how much food is left on the plate.  Get more if still hungry 20-30 minutes later.  The key is to honor satisfaction so throughout the meal, rate fullness factor and stop when comfortably satisfied not physically full. The key is to honor hunger and fullness without any feelings of guilt or shame.  Pay attention to what the internal cues are, rather than any external factors. This will enhance the confidence you have in listening to your own body and following those internal cues enabling you to increase how often you eat when you are hungry not out of appetite and stop when you are satisfied not full.  Encouraged pt to continue to drink a minium 64 fluid ounces with half being plain water to satisfy proper hydration  Discussed the importance of small frequent meals. Avoid skipping meals. Aim for 60 grams of protein per day, aiming for a protein source with every meal or snack.   Pre-Op Goals Progress & New Goals Re-engage: eat to eat every 3 to 5 hours; avoid skipping meals Continue: taking 20-30 minutes to eat Continue to aim for satisfaction before fullness; get rid of distractions; listen to our body Continue to work on wait 30 minutes after eating to drink fluids (stop 15 minutes before, during and 30 minutes) Re-engage: meal planning, plan to have a protein with every meal and snack: use container for meal prepping.  Continue: doing great with physical activity.  Handouts Provided Include  Learning Style & Readiness for Change Teaching method utilized: Visual & Auditory  Demonstrated degree of understanding via: Teach Back  Readiness Level: preparation Barriers to learning/adherence to lifestyle change: nothing  identified  RD's Notes for next Visit  Patient progress toward chosen goals  MONITORING & EVALUATION Dietary intake, weekly physical activity, body weight, and pre-op goals in 1 month.   Next Steps  Patient is to return to [facility] in 1 month for next SWL visit.

## 2023-06-14 DIAGNOSIS — E559 Vitamin D deficiency, unspecified: Secondary | ICD-10-CM | POA: Diagnosis not present

## 2023-06-15 ENCOUNTER — Other Ambulatory Visit: Payer: Self-pay | Admitting: Obstetrics and Gynecology

## 2023-06-15 DIAGNOSIS — Z13820 Encounter for screening for osteoporosis: Secondary | ICD-10-CM

## 2023-06-21 ENCOUNTER — Other Ambulatory Visit: Payer: Self-pay | Admitting: Internal Medicine

## 2023-06-21 DIAGNOSIS — E042 Nontoxic multinodular goiter: Secondary | ICD-10-CM

## 2023-06-28 ENCOUNTER — Ambulatory Visit (HOSPITAL_BASED_OUTPATIENT_CLINIC_OR_DEPARTMENT_OTHER): Payer: PPO | Admitting: Family

## 2023-06-28 ENCOUNTER — Encounter (HOSPITAL_BASED_OUTPATIENT_CLINIC_OR_DEPARTMENT_OTHER): Payer: Self-pay

## 2023-06-28 ENCOUNTER — Encounter (HOSPITAL_BASED_OUTPATIENT_CLINIC_OR_DEPARTMENT_OTHER): Payer: Self-pay | Admitting: Family

## 2023-06-28 VITALS — BP 148/86 | HR 93 | Ht 71.5 in | Wt 256.2 lb

## 2023-06-28 DIAGNOSIS — I25118 Atherosclerotic heart disease of native coronary artery with other forms of angina pectoris: Secondary | ICD-10-CM

## 2023-06-28 DIAGNOSIS — I10 Essential (primary) hypertension: Secondary | ICD-10-CM | POA: Diagnosis not present

## 2023-06-28 DIAGNOSIS — E785 Hyperlipidemia, unspecified: Secondary | ICD-10-CM | POA: Diagnosis not present

## 2023-06-28 DIAGNOSIS — G4733 Obstructive sleep apnea (adult) (pediatric): Secondary | ICD-10-CM | POA: Diagnosis not present

## 2023-06-28 DIAGNOSIS — I5022 Chronic systolic (congestive) heart failure: Secondary | ICD-10-CM

## 2023-06-28 MED ORDER — NITROGLYCERIN 0.4 MG SL SUBL: 0.4000 mg | SUBLINGUAL_TABLET | SUBLINGUAL | 4 refills | Status: AC | PRN

## 2023-06-28 MED ORDER — EMPAGLIFLOZIN 10 MG PO TABS: 10.0000 mg | ORAL_TABLET | Freq: Every day | ORAL | 11 refills | Status: DC

## 2023-06-28 MED ORDER — METOPROLOL SUCCINATE ER 25 MG PO TB24: 25.0000 mg | ORAL_TABLET | Freq: Every day | ORAL | 3 refills | Status: AC

## 2023-06-28 NOTE — Progress Notes (Signed)
 Advanced Hypertension Assessment:    Date:  06/28/2023   ID:  Destiny Beasley, DOB 1956-01-12, MRN [id number]  PCP:  Daisy Floro, MD  Cardiologist:  Tonny Bollman, MD / Dr. [facility] Nephrologist:  Referring MD: Daisy Floro, MD   CC: Hypertension  History of Present Illness:    Destiny Beasley is a 68 y.o. female with a hx of CAD s/p inferior-lateral STEMI 06/2017 with DES to LCx, HFrEF, ICM, DM2, hyperthyroidism, TIA, OSA, breast cancer, HTN, HLD with statin intolerance, COVID19, pituitary tumor s/p resection, meningioma, anxiety  here to follow up in the [facility].   CAD dates back to inf-lat STEMI 06/2017 with DES to pLCx. Cardiac catheterization 04/2020 with patent LCx stent and pLAD with 40% stenosis. Her LVEF since her STEMI has been 40-45%.    Via telemedicine 08/2020 started on Lasix. Seen 10/08/20 with complaint of hot flashes which she attributed to Carvedilol. As leaving office developed chest pain and transported to ED - symptoms resolved with nitroglycerin and Xanax, Hs-troponin negative x2, d-dimer minimally elevated 0.62 though negative when adjusted for age. Head CT unremarkable.    ED visit 10/19/20 was COVID positive treated with antiviral. Visit 11/01/20, 12/14/20, and 12/28/20 with optimization of antihypertensive regimen.  Seen 01/07/21 and Sherryll Burger increased to 97-103mg  BID. Did not tolerate increased dose and reduce to 49-51mg  BID. 07/2021 she was started on doxazosin. Echo 07/2021 LVEF 45-50%, hypokinesis inferolateral wall, gr1DDtrivial MR. She was referred to [facility].   Established with [facility] 09/2021. Hypertension initially diagnosed in her 30s and has always been difficult to control. Enrolled in [employer]. Referred to [facility]. Metoprolol transitioned to Bisoprolol. She self discontinued after 2 weeks due to muscle pain.    11/03/21 Hydralazine 25mg  BID started by pharmacy team. She discontinued due to  reported chest pain. She was started on Minoxidil 11/07/21. She self discontinued due to muscle pain, cramps. Seen 12/07/21 and Lasix increased to 40mg  for BP control.   At visit 09/21/22 Entresto increased on 97-103 milligrams twice daily.  Renal duplex ordered and performed 11/27/2022 with no evidence of RAS. At follow up 11/30/22 noted she was not tolerating the higher dose Entresto and had returned to 49-51mg  BID dose.   Last seen 03/08/2023.  Clearance provided for weight loss surgery.  Present antihypertensive regimen was continued.  Presents today for follow-up. Still pending weight loss surgery. Following nutrition. Recently started [facility] 2.5mg  and tolerating well with weight loss of 7 pounds since last visit. Has not had Jardiance in a few months, appears she ran out of refills and did not contact the office. Overall feeling well since last seen.  Reports no shortness of breath nor dyspnea on exertion. Reports no chest pain, pressure, or tightness. No edema, orthopnea, PND. Reports no palpitations. Blood pressure at home most often 130-140s/80s. Prior intolerances limit titration of antihypertensive regimen. Tolerating BIPAP without issue.  Previous antihypertensives: Amlodipine (muscle pain) Coreg (wheeze with 25mg  dose) Chlorthalidone (leg pain) Spironolactone (wheeze/fatigue) Jardiance (hallucinations, yeast infections) - now tolerating 10mg  dose Entresto (intolerant to 97-103mg  dose with cramps, tolerates 49-51mg  dose) Hydralazine (fatigue, chest pain) Imdur (wheeze) Doxazosin (leg cramps) Minoxidil (muscle pain, cramps) Metoprolol (worsening of nightmares on doses higher than 25mg )  Past Medical History:  Diagnosis Date   Anemia    Anxiety    CAD in native artery    a. Inf STEMI 06/2017 complicated by cardiogenic shock/complete heart block s/p emergent cath showing culprit large dominant  LCx s/p aspiration thrombectomy and DES, EF 50% by cath, 40-45% by echo.   Chronic systolic  heart failure (HCC) 06/03/5619   Echo 11/19: mild LVH, EF 40-45, inf-lat HK, Gr 1 DD, trivial MR   Complete heart block, transient (HCC)    a. 06/2017 in setting of acute inf MI -> resolved after PCI.   COVID-19    Diabetes mellitus    Goiter    History of radiation therapy 06/14/2016 - 07/19/2016   Right Breast 50 Gy 25 fractions   Hypercholesteremia    Hypertension    Ischemic cardiomyopathy    a. EF 50% by cath and 40-45% by echo 06/2017.   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   Meningioma [facility])    Nuclear stress test    Nuclear stress test 10/19: EF 35, inferolateral, apical scar; inferior, apical inferior infarct with mild peri-infarct ischemia; high risk   Obesity    Personal history of chemotherapy    Personal history of radiation therapy    Pituitary tumor    Seasonal asthma    Sleep apnea    does not use every night   Stroke [facility])    TIA - Dec 1 st, 2017    Past Surgical History:  Procedure Laterality Date   ABDOMINAL HYSTERECTOMY     BRAIN MENINGIOMA EXCISION  2005   BREAST BIOPSY     BREAST LUMPECTOMY Right    2017   BREAST LUMPECTOMY WITH RADIOACTIVE SEED LOCALIZATION Right 04/20/2016   Procedure: RIGHT BREAST LUMPECTOMY WITH RADIOACTIVE SEED LOCALIZATION;  Surgeon: Claud Kelp, MD;  Location: [facility];  Service: General;  Laterality: Right;   BREAST REDUCTION SURGERY Bilateral 10/23/2016   Procedure: BILATERAL BREAST REDUCTION WITH LIPOSUCTION ASSISTANCE;  Surgeon: Louisa Second, MD;  Location: [facility];  Service: Plastics;  Laterality: Bilateral;   CESAREAN SECTION     CORONARY STENT INTERVENTION N/A 06/25/2017   Procedure: CORONARY STENT INTERVENTION;  Surgeon: Tonny Bollman, MD;  Location: [facility] INVASIVE CV LAB;  Service: Cardiovascular;  Laterality: N/A;   CORONARY/GRAFT ACUTE MI REVASCULARIZATION N/A 06/25/2017   Procedure:  Coronary/Graft Acute MI Revascularization;  Surgeon: Tonny Bollman, MD;  Location: [facility] INVASIVE CV LAB;  Service: Cardiovascular;  Laterality: N/A;   FOOT SURGERY Bilateral 2016   hammer toe surgery   LEFT HEART CATH AND CORONARY ANGIOGRAPHY N/A 06/25/2017   Procedure: LEFT HEART CATH AND CORONARY ANGIOGRAPHY;  Surgeon: Tonny Bollman, MD;  Location: [facility] INVASIVE CV LAB;  Service: Cardiovascular;  Laterality: N/A;   LEFT HEART CATH AND CORONARY ANGIOGRAPHY N/A 05/13/2020   Procedure: LEFT HEART CATH AND CORONARY ANGIOGRAPHY;  Surgeon: Runell Gess, MD;  Location: [facility] INVASIVE CV LAB;  Service: Cardiovascular;  Laterality: N/A;   MENISCUS REPAIR Left 2015   PITUITARY SURGERY     REDUCTION MAMMAPLASTY     RIGHT HEART CATH N/A 06/25/2017   Procedure: RIGHT HEART CATH;  Surgeon: Tonny Bollman, MD;  Location: [facility] INVASIVE CV LAB;  Service: Cardiovascular;  Laterality: N/A;   THYROID SURGERY      Current Medications: Current Meds  Medication Sig   ADVAIR HFA 115-21 MCG/ACT inhaler as needed.   ALPRAZolam (XANAX) 0.25 MG tablet Take 0.25 mg by mouth as needed.   aspirin EC 81 MG tablet Take 1 tablet (81 mg total) by mouth daily.   Continuous Blood Gluc Sensor (FREESTYLE LIBRE 3  SENSOR) MISC APPLY 1 SENSOR TO UPPER ARM  EVERY 14 DAYS FOR CONTINUOUS  GLUCOSE MONITORING   furosemide (LASIX) 40 MG tablet TAKE 1 TABLET(40 MG) BY MOUTH DAILY   glucose blood (FREESTYLE TEST STRIPS) test strip Use as instructed   LANTUS SOLOSTAR 100 UNIT/ML Solostar Pen INJECT SUBCUTANEOUSLY 70  UNITS DAILY (Patient taking differently: Inject 64 Units into the skin daily.)   MOUNJARO 5 MG/0.5ML Pen Inject 5 mg into the skin once a week.   ondansetron (ZOFRAN-ODT) 4 MG disintegrating tablet ondansetron 4 mg disintegrating tablet  TAKE 1 TABLET BY MOUTH EVERY 4 HOURS AS NEEDED FOR NAUSEA   PRALUENT 75 MG/ML SOAJ ADMINISTER 1 ML(75 MG) UNDER THE SKIN EVERY 14 DAYS   sacubitril-valsartan (ENTRESTO) 49-51 MG Take 1 tablet  by mouth 2 (two) times daily.   valACYclovir (VALTREX) 1000 MG tablet Take 1,000 mg by mouth 2 (two) times daily as needed (infection).   venlafaxine XR (EFFEXOR-XR) 150 MG 24 hr capsule Take 37.5 mg by mouth daily.   [DISCONTINUED] metoprolol succinate (TOPROL-XL) 50 MG 24 hr tablet TAKE 1 TABLET(50 MG) BY MOUTH DAILY WITH OR IMMEDIATELY FOLLOWING A MEAL (Patient taking differently: Take 25 mg by mouth daily.)   [DISCONTINUED] nitroGLYCERIN (NITROSTAT) 0.4 MG SL tablet DISSOLVE ONE TABLET UNDER TONGUE AS NEEDED FOR CHEST PAIN     Allergies:   Hydrocodone, Invokana [canagliflozin], Amlodipine, Carvedilol, Chlorthalidone, Doxazosin, Evolocumab, Hydralazine, Imdur [isosorbide nitrate], Rosuvastatin, Semaglutide, Simvastatin, Spironolactone, Atorvastatin, and Compazine [prochlorperazine]   Social History   Socioeconomic History   Marital status: Single    Spouse name: Not on file   Number of children: Not on file   Years of education: Not on file   Highest education level: Bachelor's degree (e.g., BA, AB, BS)  Occupational History   Occupation: full time  Tobacco Use   Smoking status: Never   Smokeless tobacco: Never  Vaping Use   Vaping status: Never Used  Substance and Sexual Activity   Alcohol use: Not Currently    Comment: occ   Drug use: No   Sexual activity: Not on file    Comment: no cycles; pt had hyst  Other Topics Concern   Not on file  Social History Narrative   Lives alone   Right Handed   Drinks 1-2 cups caffeine daily   Social Drivers of Health   Financial Resource Strain: Low Risk  (10/04/2021)   Overall Financial Resource Strain (CARDIA)    Difficulty of Paying Living Expenses: Not hard at all  Food Insecurity: No Food Insecurity (10/04/2021)   Hunger Vital Sign    Worried About Running Out of Food in the Last Year: Never true    Ran Out of Food in the Last Year: Never true  Transportation Needs: No Transportation Needs (10/04/2021)   PRAPARE - Therapist, art (Medical): No    Lack of Transportation (Non-Medical): No  Physical Activity: Inactive (10/04/2021)   Exercise Vital Sign    Days of Exercise per Week: 0 days    Minutes of Exercise per Session: 0 min  Stress: Not on file  Social Connections: Not on file     Family History: The patient's family history includes Alcohol abuse in her brother; Diabetes in her mother; Heart attack in her mother; Hyperlipidemia in her mother; Hypertension in her mother and sister; Stroke in her maternal uncle.  ROS:   Please see the history of present illness.     All other systems reviewed  and are negative.  EKGs/Labs/Other Studies Reviewed:         Cardiac Studies & Procedures   ______________________________________________________________________________________________ CARDIAC CATHETERIZATION  CARDIAC CATHETERIZATION 05/13/2020  Narrative Images from the original result were not included.   Previously placed Mid Cx to Dist Cx stent (unknown type) is widely patent.  Prox LAD lesion is 40% stenosed.  There is mild to moderate left ventricular systolic dysfunction.  LV end diastolic pressure is normal.  The left ventricular ejection fraction is 45-50% by visual estimate.  Destiny Beasley is a 68 y.o. female   [id number] LOCATION:  FACILITY: [facility] PHYSICIAN: Nanetta Batty, M.D. Apr 22, 1956   DATE OF PROCEDURE:  05/13/2020  DATE OF DISCHARGE:     CARDIAC CATHETERIZATION    History obtained from chart review. Destiny Beasley is a 68 y.o. female with coronary artery disease s/p DES to proximal circumflex, chronic systolic heart failure/ischemic cardiomyopathy, hypertension, hyperlipidemia with statin intolerance, diabetes mellitus, obstructive sleep apnea on BiPAP, TIA, breast cancer and history of COVID-11 July 2019 presented for chest pressure.  History of inferior lateral STEMI in March 2019 complicated by cardiogenic shock, acute heart failure and  complete heart block.  Underwent PCI with DES to circumflex.  EF was 40 to 45% at that time.  Unable to tolerate Entresto in the past.  She was admitted yesterday with unstable angina.  Her enzymes were negative and her EKG showed no acute changes.  She was placed on IV heparin.  She presents now for diagnostic coronary angiography.  Impression Ms. Kittle had a dominant circumflex stent was widely patent.  She has mild proximal LAD disease.  She has mild LV dysfunction with an EF of approximate 45 to 50% with inferoapical hypokinesia.  There were no "culprit lesions" identifiable that would contribute to chest pain.  Recommend continued medical therapy.  The sheath was removed and a TR band was placed on the right wrist to achieve patent hemostasis.  The patient left the lab in stable condition.  Nanetta Batty. MD, [facility] 05/13/2020 8:26 AM  Findings Coronary Findings Diagnostic  Dominance: Left  Left Anterior Descending Prox LAD lesion is 40% stenosed.  Left Circumflex Previously placed Mid Cx to Dist Cx stent (unknown type) is widely patent.  Intervention  No interventions have been documented.   CARDIAC CATHETERIZATION  CARDIAC CATHETERIZATION 06/25/2017  Narrative  There is mild to moderate left ventricular systolic dysfunction.  LV end diastolic pressure is moderately elevated.  The left ventricular ejection fraction is 45-50% by visual estimate.  Prox LAD to Mid LAD lesion is 30% stenosed.  Prox Cx lesion is 100% stenosed.  A drug-eluting stent was successfully placed using a STENT SYNERGY DES 4X20.  Post intervention, there is a 0% residual stenosis.  1.  Acute inferolateral STEMI involving a large, dominant left circumflex complicated by cardiogenic shock and complete heart block 2.  Successful PCI of the left circumflex using aspiration thrombectomy and drug-eluting stent implantation 3.  Mild nonobstructive disease in the left main, LAD, and nondominant RCA  without significant stenosis in any of those vessels 4.  Mild segmental contraction abnormality of the left ventricle consistent with a left circumflex/inferior infarct, with mild segmental LV systolic dysfunction, LVEF estimated at 50% 5.  Acute systolic heart failure with elevated LVEDP and mildly elevated right heart pressures  Recommendations: The patient will be transferred to the cardiac ICU for continued post MI care.  We will continue Cangrelor for 2 hours.  She is loaded with Brilinta 180  mg at the completion of the procedure.  Post MI medical therapy will be instituted.  We will wean her off of dopamine as tolerated.  Findings Coronary Findings Diagnostic  Dominance: Left  Left Main The vessel exhibits minimal luminal irregularities.  Left Anterior Descending The vessel exhibits minimal luminal irregularities. The LAD courses to the apex.  The vessel has mild diffuse nonobstructive disease without significant stenosis. Prox LAD to Mid LAD lesion is 30% stenosed.  Left Circumflex Prox Cx lesion is 100% stenosed. The lesion is heavily thrombotic. The circumflex is a large, dominant vessel.  The mid circumflex after a small first obtuse marginal branch is totally occluded with heavy thrombus.  Right Coronary Artery Vessel is small. The vessel exhibits minimal luminal irregularities. Nondominant vessel, minimal irregularity noted.  Intervention  Prox Cx lesion Stent Lesion crossed with guidewire using a WIRE COUGAR XT STRL 190CM. Pre-stent angioplasty was not performed. A drug-eluting stent was successfully placed using a STENT SYNERGY DES 4X20. Post-stent angioplasty was performed using a BALLOON  EMERGE MR 4.0X15. Maximum pressure:  16 atm. The circumflex is occluded with heavy thrombus.  Bivalirudin and Cangrelor are used for an anti-thrombotic strategy.  The patient is acutely ill and unable to swallow any oral antiplatelet therapy.  An EBU 3.5 cm guide catheter is utilized.  A  therapeutic ACT is achieved.  A cougar wire is advanced across the occlusion without difficulty into the second obtuse marginal branch.  Aspiration thrombectomy is performed.  A total of 2 passes are made.  This restored TIMI-3 flow in the vessel.  There is some downstream thrombus and distal vessel occlusion noted in an OM subbranch and in the left PDA out very distal in that vessel.  The lesion is then stented with a 4.0 x 20 mm Synergy drug-eluting stent deployed at 14 atm. The stent is postdilated with a 4.0 mm noncompliant balloon to 16 atm.  There is an excellent angiographic result at the lesion site.  Distal flow is improved at the completion of the procedure.  The procedure is performed with the patient on 10 mics of dopamine.  By the completion of the procedure the dopamine is turned down to 5 mics.  A right heart catheterization is performed after PCI to help determine whether further hemodynamic support is necessary.  Because of preserved cardiac output, hemodynamic support is deferred.  The right femoral artery is closed with a Perclose device.  There is a femoral venous sheath still in place. Post-Intervention Lesion Assessment There is a 0% residual stenosis post intervention.   STRESS TESTS  MYOCARDIAL PERFUSION IMAGING 02/12/2018  Narrative  Nuclear stress EF: 35%.  The left ventricular ejection fraction is moderately decreased (30-44%).  Blood pressure demonstrated a normal response to exercise.  There was no ST segment deviation noted during stress.  There is a medium defect of severe severity present in the basal inferolateral, mid inferolateral and apical lateral location. The defect is non-reversible and consistent with scar.  There is a medium defect of moderate severity present in the basal inferior, mid inferior and apical inferior location. The defect is partially reversible and consistent with infarct with mild peri infarct ischemia.  This is a high risk study.    ECHOCARDIOGRAM  ECHOCARDIOGRAM COMPLETE 08/17/2021  Narrative ECHOCARDIOGRAM REPORT    Patient Name:   Destiny Beasley Date of Exam: 08/17/2021 Medical Rec #:  [id number]      Height:       72.0 in Accession #:  [id number]     Weight:       266.6 lb Date of Birth:  [birth year]      BSA:          2.407 m Patient Age:    65 years       BP:           160/80 mmHg Patient Gender: F              HR:           74 bpm. Exam Location:  Outpatient  Procedure: 2D Echo, Cardiac Doppler and Color Doppler  Indications:    I50.23 Acute on chronic systolic (congestive) heart failure  History:        Patient has prior history of Echocardiogram examinations, most recent 05/13/2020. CHF and Cardiomyopathy, Previous Myocardial Infarction and CAD, TIA and Stroke; Risk Factors:Hypertension, Sleep Apnea, Diabetes, Dyslipidemia and Non-Smoker. Patient denies chest pain, SOB and leg edema. Patient had CoVid-19 x 2 most recently 2021.  Sonographer:    Carlos American RVT, RDCS (AE), RDMS Referring Phys: [phone] [facility]   Sonographer Comments: Suboptimal subcostal window, suboptimal apical window and patient is morbidly obese. Image acquisition challenging due to patient body habitus. IMPRESSIONS   1. Hypokinesis of the inferolateral wall with overall mild LV dysfunction. 2. Left ventricular ejection fraction, by estimation, is 45 to 50%. The left ventricle has mildly decreased function. The left ventricle demonstrates regional wall motion abnormalities (see scoring diagram/findings for description). Left ventricular diastolic parameters are consistent with Grade I diastolic dysfunction (impaired relaxation). 3. Right ventricular systolic function is normal. The right ventricular size is normal. 4. Left atrial size was mildly dilated. 5. The mitral valve is normal in structure. Trivial mitral valve regurgitation. No evidence of mitral stenosis. 6. The aortic valve is tricuspid. Aortic valve  regurgitation is not visualized. Aortic valve sclerosis is present, with no evidence of aortic valve stenosis.  Comparison(s): EF 40%,mild hypokinesis of the left ventricular, mid-apical inferior wall and inferolateral wall, trivial AI.  FINDINGS Left Ventricle: Left ventricular ejection fraction, by estimation, is 45 to 50%. The left ventricle has mildly decreased function. The left ventricle demonstrates regional wall motion abnormalities. The left ventricular internal cavity size was normal in size. There is no left ventricular hypertrophy. Left ventricular diastolic parameters are consistent with Grade I diastolic dysfunction (impaired relaxation).  Right Ventricle: The right ventricular size is normal. Right ventricular systolic function is normal.  Left Atrium: Left atrial size was mildly dilated.  Right Atrium: Right atrial size was normal in size.  Pericardium: Trivial pericardial effusion is present.  Mitral Valve: The mitral valve is normal in structure. Trivial mitral valve regurgitation. No evidence of mitral valve stenosis.  Tricuspid Valve: The tricuspid valve is normal in structure. Tricuspid valve regurgitation is trivial. No evidence of tricuspid stenosis.  Aortic Valve: The aortic valve is tricuspid. Aortic valve regurgitation is not visualized. Aortic valve sclerosis is present, with no evidence of aortic valve stenosis. Aortic valve mean gradient measures 2.0 mmHg. Aortic valve peak gradient measures 4.2 mmHg. Aortic valve area, by VTI measures 2.32 cm.  Pulmonic Valve: The pulmonic valve was normal in structure. Pulmonic valve regurgitation is not visualized. No evidence of pulmonic stenosis.  Aorta: The aortic root is normal in size and structure.  Venous: The inferior vena cava was not well visualized.  IAS/Shunts: The interatrial septum was not well visualized.  Additional Comments: Hypokinesis of the inferolateral wall with overall mild LV  dysfunction.  LEFT VENTRICLE PLAX 2D LVIDd:         4.12 cm      Diastology LVIDs:         3.04 cm      LV e' medial:    5.87 cm/s LV PW:         1.05 cm      LV E/e' medial:  13.0 LV IVS:        0.70 cm      LV e' lateral:   9.25 cm/s LVOT diam:     2.00 cm      LV E/e' lateral: 8.3 LV SV:         52 LV SV Index:   22 LVOT Area:     3.14 cm  3D Volume EF: LV Volumes (MOD)            3D EF:        55 % LV vol d, MOD A2C: 105.0 ml LV EDV:       116 ml LV vol d, MOD A4C: 123.0 ml LV ESV:       52 ml LV vol s, MOD A2C: 48.8 ml  LV SV:        63 ml LV vol s, MOD A4C: 63.6 ml LV SV MOD A2C:     56.2 ml LV SV MOD A4C:     123.0 ml LV SV MOD BP:      59.5 ml  RIGHT VENTRICLE RV S prime:     11.90 cm/s TAPSE (M-mode): 2.0 cm  LEFT ATRIUM              Index        RIGHT ATRIUM          Index LA diam:        4.00 cm  1.66 cm/m   RA Area:     9.09 cm LA Vol (A2C):   128.0 ml 53.17 ml/m  RA Volume:   17.20 ml 7.14 ml/m LA Vol (A4C):   71.6 ml  29.74 ml/m LA Biplane Vol: 98.3 ml  40.83 ml/m AORTIC VALVE                    PULMONIC VALVE AV Area (Vmax):    2.31 cm     PV Vmax:          0.76 m/s AV Area (Vmean):   2.36 cm     PV Peak grad:     2.3 mmHg AV Area (VTI):     2.32 cm     PR End Diast Vel: 0.44 msec AV Vmax:           103.00 cm/s AV Vmean:          74.800 cm/s AV VTI:            0.225 m AV Peak Grad:      4.2 mmHg AV Mean Grad:      2.0 mmHg LVOT Vmax:         75.60 cm/s LVOT Vmean:        56.300 cm/s LVOT VTI:          0.166 m LVOT/AV VTI ratio: 0.74  AORTA Ao Root diam: 3.50 cm Ao Asc diam:  3.10 cm Ao Arch diam: 3.4 cm  MITRAL VALVE MV Area (PHT): 4.57 cm    SHUNTS MV Decel Time: 166 msec    Systemic VTI:  0.17 m MV E velocity: 76.50  cm/s  Systemic Diam: 2.00 cm MV A velocity: 86.00 cm/s MV E/A ratio:  0.89  Olga Millers MD Electronically signed by Olga Millers MD Signature Date/Time: 08/17/2021/1:49:04 PM    Final           ______________________________________________________________________________________________       Recent Labs: 09/13/2022: BNP 24.4 12/22/2022: BUN 12; Creatinine, Ser 0.86; Hemoglobin 14.2; Platelets 309; Potassium 3.9; Sodium 137   Recent Lipid Panel    Component Value Date/Time   CHOL 120 10/06/2020 0743   TRIG 63 10/06/2020 0743   HDL 54 10/06/2020 0743   CHOLHDL 2.2 10/06/2020 0743   CHOLHDL 2 08/12/2018 0944   VLDL 19.2 08/12/2018 0944   LDLCALC 52 10/06/2020 0743   LDLDIRECT 67 07/15/2020 1609   LDLDIRECT 87.0 06/04/2019 0835    Physical Exam:   VS:  BP (!) 148/86 Comment: right arm  Pulse 93   Ht 5' 11.5" (1.816 m)   Wt 256 lb 3.2 oz (116.2 kg)   SpO2 100%   BMI 35.23 kg/m  , BMI Body mass index is 35.23 kg/m. GENERAL:  Well appearing HEENT: Pupils equal round and reactive, fundi not visualized, oral mucosa unremarkable NECK:  No jugular venous distention, waveform within normal limits, carotid upstroke brisk and symmetric, no bruits, no thyromegaly LYMPHATICS:  No cervical adenopathy LUNGS:  Clear to auscultation bilaterally HEART:  RRR.  PMI not displaced or sustained,S1 and S2 within normal limits, no S3, no S4, no clicks, no rubs, no murmurs ABD:  Flat, positive bowel sounds normal in frequency in pitch, no bruits, no rebound, no guarding, no midline pulsatile mass, no hepatomegaly, no splenomegaly EXT:  2 plus pulses throughout, no edema, no cyanosis no clubbing SKIN:  No rashes no nodules NEURO:  Cranial nerves II through XII grossly intact, motor grossly intact throughout PSYCH:  Cognitively intact, oriented to person place and time   ASSESSMENT/PLAN:    CAD/HLD, LDL goal less than 70- Stable with no anginal symptoms. No indication for ischemic evaluation.  Prior intolerance to atorvastatin, rosuvastatin, simvastatin with myalgias.  GDMT includes Praluent, Aspirin, Metoprolol. Heart healthy diet and regular cardiovascular exercise encouraged. Update  CMET, lipid panel, CBC today.   HFrEF / ICM - 05/13/20 LVEF 40-45%.  07/2021 LVEF 45-50%. GDMT includes Toprol 25 mg daily (previous intolerance to Carvedilol and higher doses Toprol), Entresto 49-51 mg twice daily, Lasix 40 mg daily. She has been out of Jardiance for few months, appears she ran out of refills and did not contact the office.  Resume Jardiance 10 mg daily.  Heart healthy diet and regular cardiovascular exercise encouraged.  Educated to report weight gain of 2 lb overnight or 5 lb in 1 week.    HTN - Renal duplex 11/2022 with no stenosis.  Wearing BIPAP regularly.  Secondary hypertension workup has been otherwise unremarkable. Multiple prior intolerances, detailed above, limit further medication titration. Previously discussed RDN, consider when procedure resumed. Also anticipate BP control will improve with weight loss on Mounjaro. Continue present antihypertensive regimen Lasix 40mg  daily, Toprol 25mg  daily, Entresto 49-51mg  BID.    Anxiety - Continue to follow with PCP.    OSA - BIPAP compliance encouraged. Endorses wearing regularly.   Screening for Secondary Hypertension:     10/04/2021    9:43 AM  Causes  Drugs/Herbals Screened     - Comments limitis sodium and caffeine.  Uses Aleve bid.  Renovascular HTN Screened     - Comments No renal artery stenosis on CT 05/2021  Sleep Apnea Screened     - Comments uses BiPAP  Thyroid Disease Screened  Hyperaldosteronism Screened     - Comments No adenomas on CT in 2023  Pheochromocytoma Screened     - Comments No adenomas or symptoms.  Cushing's Syndrome N/A  Hyperparathyroidism Screened  Coarctation of the Aorta Screened     - Comments BP symmetric  Compliance Screened    Relevant Labs/Studies:    Latest Ref Rng & Units 12/22/2022   11:57 AM 10/04/2022    4:04 PM 09/13/2022   10:27 AM  Basic Labs  Sodium 135 - 145 mmol/L 137  142  145   Potassium 3.5 - 5.1 mmol/L 3.9  4.4  4.9   Creatinine 0.44 - 1.00 mg/dL 1.61  0.96   0.45        Latest Ref Rng & Units 01/18/2021    8:46 AM 11/30/2020    8:22 AM  Thyroid   TSH 0.35 - 5.50 uIU/mL 1.26  3.57              Latest Ref Rng & Units 11/11/2011    1:14 PM  Cortisol  Cortisol  ug/dL 9.6        07/31/8117   10:09 AM  Renovascular   Renal Artery [place] Completed Yes      Disposition:    FU with PharmD in 4 months   Medication Adjustments/Labs and Tests Ordered: Current medicines are reviewed at length with the patient today.  Concerns regarding medicines are outlined above.  Orders Placed This Encounter  Procedures   Comprehensive metabolic panel   CBC   Lipid panel   Meds ordered this encounter  Medications   nitroGLYCERIN (NITROSTAT) 0.4 MG SL tablet    Sig: Place 1 tablet (0.4 mg total) under the tongue every 5 (five) minutes as needed for chest pain.    Dispense:  25 tablet    Refill:  4   metoprolol succinate (TOPROL-XL) 25 MG 24 hr tablet    Sig: Take 1 tablet (25 mg total) by mouth daily. Take with or immediately following a meal.    Dispense:  90 tablet    Refill:  3    Signed, Alver Sorrow, NP  06/28/2023 12:38 PM    [facility]

## 2023-06-28 NOTE — Patient Instructions (Signed)
 Medication Instructions:  Your physician has recommended you make the following change in your medication:   Start: Jardiance 10mg  daily   We have sent refills of Metoprolol and nitroglycerin    Labwork: Your physician recommends that you return for lab work today- CMP, CBC, and Lipid Panel   Follow-Up: 4 months in [facility] with Dr. Duke Salvia or Gillian Shields, NP

## 2023-06-29 ENCOUNTER — Encounter (HOSPITAL_BASED_OUTPATIENT_CLINIC_OR_DEPARTMENT_OTHER): Payer: Self-pay

## 2023-06-29 LAB — COMPREHENSIVE METABOLIC PANEL
ALT: 18 IU/L (ref 0–32)
AST: 22 IU/L (ref 0–40)
Albumin: 4.5 g/dL (ref 3.9–4.9)
Alkaline Phosphatase: 138 IU/L — ABNORMAL HIGH (ref 44–121)
BUN/Creatinine Ratio: 16 (ref 12–28)
BUN: 14 mg/dL (ref 8–27)
Bilirubin Total: 0.2 mg/dL (ref 0.0–1.2)
CO2: 27 mmol/L (ref 20–29)
Calcium: 9.6 mg/dL (ref 8.7–10.3)
Chloride: 101 mmol/L (ref 96–106)
Creatinine, Ser: 0.85 mg/dL (ref 0.57–1.00)
Globulin, Total: 3.1 g/dL (ref 1.5–4.5)
Glucose: 220 mg/dL — ABNORMAL HIGH (ref 70–99)
Potassium: 4.5 mmol/L (ref 3.5–5.2)
Sodium: 144 mmol/L (ref 134–144)
Total Protein: 7.6 g/dL (ref 6.0–8.5)
eGFR: 76 mL/min/{1.73_m2} (ref >59)

## 2023-06-29 LAB — LIPID PANEL
Chol/HDL Ratio: 2.5 ratio (ref 0.0–4.4)
Cholesterol, Total: 155 mg/dL (ref 100–199)
HDL: 61 mg/dL (ref >39)
LDL Chol Calc (NIH): 77 mg/dL (ref 0–99)
Triglycerides: 89 mg/dL (ref 0–149)
VLDL Cholesterol Cal: 17 mg/dL (ref 5–40)

## 2023-06-29 LAB — CBC
Hematocrit: 44 % (ref 34.0–46.6)
Hemoglobin: 14.2 g/dL (ref 11.1–15.9)
MCH: 28.7 pg (ref 26.6–33.0)
MCHC: 32.3 g/dL (ref 31.5–35.7)
MCV: 89 fL (ref 79–97)
Platelets: 336 10*3/uL (ref 150–450)
RBC: 4.95 x10E6/uL (ref 3.77–5.28)
RDW: 12.5 % (ref 11.7–15.4)
WBC: 6.9 10*3/uL (ref 3.4–10.8)

## 2023-07-02 ENCOUNTER — Encounter: Payer: PPO | Attending: General Surgery | Admitting: Dietician

## 2023-07-02 ENCOUNTER — Encounter: Payer: Self-pay | Admitting: Dietician

## 2023-07-02 VITALS — Ht 71.5 in | Wt 260.0 lb

## 2023-07-02 DIAGNOSIS — Z794 Long term (current) use of insulin: Secondary | ICD-10-CM | POA: Diagnosis not present

## 2023-07-02 DIAGNOSIS — E669 Obesity, unspecified: Secondary | ICD-10-CM | POA: Diagnosis present

## 2023-07-02 DIAGNOSIS — E1169 Type 2 diabetes mellitus with other specified complication: Secondary | ICD-10-CM | POA: Diagnosis not present

## 2023-07-02 NOTE — Progress Notes (Signed)
 Supervised Weight Loss Visit Bariatric Nutrition Education Appt Start Time: [phone]    End Time:   Planned surgery: RYGB Pt expectation of surgery: get off insulin  4 out of 6 SWL Appointments   NUTRITION ASSESSMENT   Anthropometrics  Start weight at [facility]: 262.3 lbs (date: 03/26/2023) Height: 71.5 in Weight today: 260.0 lb  BMI: 35.76 kg/m2     Clinical   Pharmacotherapy: History of weight loss medication used: Mounjaro, Ozempic (not tolerated)  Medical hx: cancer, T2DM, HTN, asthma, obesity, sleep apnea, heart attack Medications: Praluent, entresol, metoprolol succinate, furosemide, venlafaxine XR, Lantuss, alprazolam, Advair, Mounjaro Labs: A1c 9.2 Notable signs/symptoms: none noted Any previous deficiencies? No  Lifestyle & Dietary Hx  Pt states she is still skipping meals, stating she will drink a [employer] Protein shake. Pt states she will pack some things for lunch, stating it may not be every day. Pt states she does not drink with her meals, but states she may be grazing on snacks. Pt agreeable to avoid grazing, set meal times and practice not drinking with meals. Pt states she has honey wheat bread. Pt is agreeable to looking for a 100% whole wheat bread. Pt states she has a libre CGM. Pt states she is still taking Mounjaro, stating she is on 2.5 dosage. Pt states the doctor found thyroid nodules.  Estimated daily fluid intake: 64 oz Supplements: multivitamin daily Current average weekly physical activity: gym 4 days a week for 90-120 minutes  No lunch no breakfast 24-Hr Dietary Recall First Meal: skipped or Protein drink, 2 pieces of toast w/ butter with coffee sugar free creamer and 2 splenda packets Snack: Protein drink Second Meal: skipped or apple or [place] sandwich with cheese or jumblia OR Kale with onions with meatloaf Snack: cold stone ice cream Third Meal:  chicken breast baked chicken with  sour sauce and vegetables OR salad Snack: popcorn, individual  serving of ice cream, chips, apple crisps, mixed fruit Beverages: water, coffee with Splenda and sugar free creamer  Estimated Energy Needs Calories: 1600  NUTRITION DIAGNOSIS  Overweight/obesity ([place]-3.3) related to past poor dietary habits and physical inactivity as evidenced by patient w/ planned RYGB surgery following dietary guidelines for continued weight loss.  NUTRITION INTERVENTION  Nutrition counseling (C-1) and education (E-2) to facilitate bariatric surgery goals.  Encouraged patient to honor their body's internal hunger and fullness cues.  Throughout the day, check in mentally and rate hunger. Stop eating when satisfied not full regardless of how much food is left on the plate.  Get more if still hungry 20-30 minutes later.  The key is to honor satisfaction so throughout the meal, rate fullness factor and stop when comfortably satisfied not physically full. The key is to honor hunger and fullness without any feelings of guilt or shame.  Pay attention to what the internal cues are, rather than any external factors. This will enhance the confidence you have in listening to your own body and following those internal cues enabling you to increase how often you eat when you are hungry not out of appetite and stop when you are satisfied not full.  Encouraged pt to continue to drink a minium 64 fluid ounces with half being plain water to satisfy proper hydration  Discussed the importance of small frequent meals. Avoid skipping meals. Aim for 60 grams of protein per day, aiming for a protein source with every meal or snack.   Whole-grain foods are good choices for a nutritious diet. Whole grains provide fiber, vitamins,  minerals and other nutrients. Whole-grain foods help control of cholesterol levels, weight and blood pressure. These foods also help lower the risk of diabetes, heart disease and other conditions. The Dietary Guidelines for Americans recommends that at least half of all the  grains you eat are whole grains.  Eat regularly throughout the day.   Avoid eating very large portions of food only 1-2 times per day.   Eat 3 small meals and 2-3 small snacks at regularly scheduled intervals throughout the day.   Avoid skipping meals.   Avoid "grazing" or nibbling throughout the day.   Eat slowly and stop eating when you feel comfortably full.   Take dime sized bites.   Chew each bite 20-30 times before swallowing--think pureed consistency.   Put down your utensil between each bite and wait to see how your stomach feels before taking another bite.   Don't eat too quickly. Meals should last about 20-30 minutes.   Stop eating as soon as you feel comfortably satisfied, not overfull. Fullness may feel like a pressure in your upper stomach. This can contribute to pain, nausea and vomiting.   Pre-Op Goals Progress & New Goals Continue: eat to eat every 3 to 5 hours; avoid skipping meals Continue: taking 20-30 minutes to eat Continue to aim for satisfaction before fullness; get rid of distractions; listen to our body Continue to work on wait 30 minutes after eating to drink fluids (stop 15 minutes before, during and 30 minutes) Continue: meal planning, plan to have a protein with every meal and snack: use container for meal prepping.  Continue: doing great with physical activity. New: focus on scheduled meals; use alarms on phone; try more food for breakfast (boiled egg or yogurt for protein); avoid grazing New: practice not drinking with meals. New: check your bread and try whole wheat/grain.  Handouts Provided Include    Learning Style & Readiness for Change Teaching method utilized: Visual & Auditory  Demonstrated degree of understanding via: Teach Back  Readiness Level: preparation Barriers to learning/adherence to lifestyle change: nothing identified  RD's Notes for next Visit  Patient progress toward chosen goals  MONITORING & EVALUATION Dietary intake, weekly  physical activity, body weight, and pre-op goals in 1 month.   Next Steps  Patient is to return to [facility] in 1 month for next SWL visit.

## 2023-07-04 DIAGNOSIS — E1165 Type 2 diabetes mellitus with hyperglycemia: Secondary | ICD-10-CM | POA: Diagnosis not present

## 2023-07-04 DIAGNOSIS — G4733 Obstructive sleep apnea (adult) (pediatric): Secondary | ICD-10-CM | POA: Diagnosis not present

## 2023-07-04 DIAGNOSIS — I10 Essential (primary) hypertension: Secondary | ICD-10-CM | POA: Diagnosis not present

## 2023-07-11 ENCOUNTER — Other Ambulatory Visit: Payer: Self-pay | Admitting: Physician Assistant

## 2023-07-25 ENCOUNTER — Other Ambulatory Visit (HOSPITAL_COMMUNITY)
Admission: RE | Admit: 2023-07-25 | Discharge: 2023-07-25 | Disposition: A | Discharge status: Home / Self Care | Source: Ambulatory Visit | Attending: Internal Medicine | Admitting: Internal Medicine

## 2023-07-25 ENCOUNTER — Ambulatory Visit
Admission: RE | Admit: 2023-07-25 | Discharge: 2023-07-25 | Disposition: A | Discharge status: Home / Self Care | Payer: PPO | Source: Ambulatory Visit | Attending: Internal Medicine | Admitting: Internal Medicine

## 2023-07-25 DIAGNOSIS — E042 Nontoxic multinodular goiter: Secondary | ICD-10-CM

## 2023-07-25 DIAGNOSIS — E041 Nontoxic single thyroid nodule: Secondary | ICD-10-CM | POA: Diagnosis not present

## 2023-07-27 LAB — CYTOLOGY - NON PAP

## 2023-07-30 ENCOUNTER — Encounter: Attending: Family Medicine | Admitting: Dietician

## 2023-07-30 ENCOUNTER — Encounter: Payer: Self-pay | Admitting: Dietician

## 2023-07-30 VITALS — Ht 71.5 in | Wt 250.2 lb

## 2023-07-30 DIAGNOSIS — I10 Essential (primary) hypertension: Secondary | ICD-10-CM | POA: Insufficient documentation

## 2023-07-30 DIAGNOSIS — E669 Obesity, unspecified: Secondary | ICD-10-CM

## 2023-07-30 DIAGNOSIS — Z6834 Body mass index (BMI) 34.0-34.9, adult: Secondary | ICD-10-CM | POA: Insufficient documentation

## 2023-07-30 DIAGNOSIS — E1129 Type 2 diabetes mellitus with other diabetic kidney complication: Secondary | ICD-10-CM | POA: Insufficient documentation

## 2023-07-30 DIAGNOSIS — R110 Nausea: Secondary | ICD-10-CM | POA: Diagnosis not present

## 2023-07-30 DIAGNOSIS — Z713 Dietary counseling and surveillance: Secondary | ICD-10-CM | POA: Insufficient documentation

## 2023-07-30 NOTE — Progress Notes (Signed)
 Supervised Weight Loss Visit Bariatric Nutrition Education Appt Start Time: 1135    End Time: 1154  Planned surgery: RYGB Pt expectation of surgery: get off insulin  5 out of 6 SWL Appointments   NUTRITION ASSESSMENT   Anthropometrics  Start weight at [facility]: 262.3 lbs (date: 03/26/2023) Height: 71.5 in Weight today: 250.2 lb  BMI: 34.41 kg/m2     Clinical   Pharmacotherapy: History of weight loss medication used: Mounjaro, Ozempic (not tolerated)  Medical hx: cancer, T2DM, HTN, asthma, obesity, sleep apnea, heart attack Medications: Praluent, entresol, metoprolol succinate, furosemide, venlafaxine XR, Lantuss, alprazolam, Advair, Mounjaro Labs: A1c 9.2 Notable signs/symptoms: none noted Any previous deficiencies? No  Lifestyle & Dietary Hx  Pt states she has been practicing not drinking with meals, stating she is drinking lots of water. Pt states she has been avoiding a lot of bread, stating she still eats cinnamon raison english muffin once in [place]. Pt states she has been eating breakfast more often, stating she may still have a protein shake in the morning. Pt states she really needs to focus more on meal planning and meal prepping.  Estimated daily fluid intake: 64+ oz Supplements: multivitamin daily Current average weekly physical activity: gym 4 days a week for 90-120 minutes  No lunch no breakfast 24-Hr Dietary Recall First Meal: skipped or Protein drink, 2 pieces of toast w/ butter with coffee sugar free creamer and 2 splenda packets Snack: Protein drink Second Meal: skipped or apple or [place] sandwich with cheese or jumblia OR Kale with onions with meatloaf Snack: cold stone ice cream Third Meal:  chicken breast baked chicken with  sour sauce and vegetables OR salad Snack: popcorn, individual serving of ice cream, chips, apple crisps, mixed fruit Beverages: water, coffee with Splenda and sugar free creamer   Estimated Energy Needs Calories: 1600  NUTRITION  DIAGNOSIS  Overweight/obesity ([place]-3.3) related to past poor dietary habits and physical inactivity as evidenced by patient w/ planned RYGB surgery following dietary guidelines for continued weight loss.  NUTRITION INTERVENTION  Nutrition counseling (C-1) and education (E-2) to facilitate bariatric surgery goals.  Encouraged patient to honor their body's internal hunger and fullness cues.  Throughout the day, check in mentally and rate hunger. Stop eating when satisfied not full regardless of how much food is left on the plate.  Get more if still hungry 20-30 minutes later.  The key is to honor satisfaction so throughout the meal, rate fullness factor and stop when comfortably satisfied not physically full. The key is to honor hunger and fullness without any feelings of guilt or shame.  Pay attention to what the internal cues are, rather than any external factors. This will enhance the confidence you have in listening to your own body and following those internal cues enabling you to increase how often you eat when you are hungry not out of appetite and stop when you are satisfied not full.  Encouraged pt to continue to drink a minium 64 fluid ounces with half being plain water to satisfy proper hydration  Discussed the importance of small frequent meals. Avoid skipping meals. Aim for 60 grams of protein per day, aiming for a protein source with every meal or snack.   Whole-grain foods are good choices for a nutritious diet. Whole grains provide fiber, vitamins, minerals and other nutrients. Whole-grain foods help control of cholesterol levels, weight and blood pressure. These foods also help lower the risk of diabetes, heart disease and other conditions. The Dietary Guidelines for Americans  recommends that at least half of all the grains you eat are whole grains.  Eat regularly throughout the day.   Avoid eating very large portions of food only 1-2 times per day.   Eat 3 small meals and 2-3 small  snacks at regularly scheduled intervals throughout the day.   Avoid skipping meals.   Avoid "grazing" or nibbling throughout the day.   Eat slowly and stop eating when you feel comfortably full.   Take dime sized bites.   Chew each bite 20-30 times before swallowing--think pureed consistency.   Put down your utensil between each bite and wait to see how your stomach feels before taking another bite.   Don't eat too quickly. Meals should last about 20-30 minutes.   Stop eating as soon as you feel comfortably satisfied, not overfull. Fullness may feel like a pressure in your upper stomach. This can contribute to pain, nausea and vomiting.   Reviewed the Bariatric MyPlate handout.  Reviewed all pt goals.  Pre-Op Goals Progress & New Goals Continue: eat to eat every 3 to 5 hours; avoid skipping meals Continue: taking 20-30 minutes to eat Continue: to aim for satisfaction before fullness; get rid of distractions; listen to our body Continue: to work on wait 30 minutes after eating to drink fluids (stop 15 minutes before, during and 30 minutes) Re-engage: meal planning, plan to have a protein with every meal and snack: use container for meal prepping. Continue: doing great with physical activity. Continue: focus on scheduled meals; use alarms on phone; try more food for breakfast (boiled egg or yogurt for protein); avoid grazing New: increase non-starchy vegetables; aim for 2 or more servings per day.  Handouts Provided Include    Learning Style & Readiness for Change Teaching method utilized: Visual & Auditory  Demonstrated degree of understanding via: Teach Back  Readiness Level: preparation Barriers to learning/adherence to lifestyle change: nothing identified  RD's Notes for next Visit  Patient progress toward chosen goals  MONITORING & EVALUATION Dietary intake, weekly physical activity, body weight, and pre-op goals in 1 month.   Next Steps  Patient is to return to [facility] in 1  month for next SWL visit.

## 2023-08-01 DIAGNOSIS — I10 Essential (primary) hypertension: Secondary | ICD-10-CM | POA: Diagnosis not present

## 2023-08-01 DIAGNOSIS — G4733 Obstructive sleep apnea (adult) (pediatric): Secondary | ICD-10-CM | POA: Diagnosis not present

## 2023-08-01 DIAGNOSIS — Z6834 Body mass index (BMI) 34.0-34.9, adult: Secondary | ICD-10-CM | POA: Diagnosis not present

## 2023-08-01 DIAGNOSIS — E1165 Type 2 diabetes mellitus with hyperglycemia: Secondary | ICD-10-CM | POA: Diagnosis not present

## 2023-08-01 DIAGNOSIS — G4731 Primary central sleep apnea: Secondary | ICD-10-CM | POA: Diagnosis not present

## 2023-08-01 DIAGNOSIS — E66811 Obesity, class 1: Secondary | ICD-10-CM | POA: Diagnosis not present

## 2023-08-03 DIAGNOSIS — G4733 Obstructive sleep apnea (adult) (pediatric): Secondary | ICD-10-CM | POA: Diagnosis not present

## 2023-08-10 ENCOUNTER — Encounter (HOSPITAL_BASED_OUTPATIENT_CLINIC_OR_DEPARTMENT_OTHER): Payer: Self-pay

## 2023-08-14 DIAGNOSIS — G4733 Obstructive sleep apnea (adult) (pediatric): Secondary | ICD-10-CM | POA: Diagnosis not present

## 2023-08-14 DIAGNOSIS — G4761 Periodic limb movement disorder: Secondary | ICD-10-CM | POA: Diagnosis not present

## 2023-08-14 DIAGNOSIS — I5022 Chronic systolic (congestive) heart failure: Secondary | ICD-10-CM | POA: Diagnosis not present

## 2023-08-14 DIAGNOSIS — E1165 Type 2 diabetes mellitus with hyperglycemia: Secondary | ICD-10-CM | POA: Diagnosis not present

## 2023-08-16 DIAGNOSIS — G4733 Obstructive sleep apnea (adult) (pediatric): Secondary | ICD-10-CM | POA: Diagnosis not present

## 2023-08-20 DIAGNOSIS — E236 Other disorders of pituitary gland: Secondary | ICD-10-CM | POA: Diagnosis not present

## 2023-08-20 DIAGNOSIS — E669 Obesity, unspecified: Secondary | ICD-10-CM | POA: Diagnosis not present

## 2023-08-20 DIAGNOSIS — Z794 Long term (current) use of insulin: Secondary | ICD-10-CM | POA: Diagnosis not present

## 2023-08-20 DIAGNOSIS — Z9009 Acquired absence of other part of head and neck: Secondary | ICD-10-CM | POA: Diagnosis not present

## 2023-08-20 DIAGNOSIS — Z9889 Other specified postprocedural states: Secondary | ICD-10-CM | POA: Diagnosis not present

## 2023-08-20 DIAGNOSIS — Z87898 Personal history of other specified conditions: Secondary | ICD-10-CM | POA: Diagnosis not present

## 2023-08-20 DIAGNOSIS — E041 Nontoxic single thyroid nodule: Secondary | ICD-10-CM | POA: Diagnosis not present

## 2023-08-20 DIAGNOSIS — E1165 Type 2 diabetes mellitus with hyperglycemia: Secondary | ICD-10-CM | POA: Diagnosis not present

## 2023-08-29 DIAGNOSIS — G4733 Obstructive sleep apnea (adult) (pediatric): Secondary | ICD-10-CM | POA: Diagnosis not present

## 2023-08-29 DIAGNOSIS — E66811 Obesity, class 1: Secondary | ICD-10-CM | POA: Diagnosis not present

## 2023-08-29 DIAGNOSIS — I10 Essential (primary) hypertension: Secondary | ICD-10-CM | POA: Diagnosis not present

## 2023-08-29 DIAGNOSIS — Z6833 Body mass index (BMI) 33.0-33.9, adult: Secondary | ICD-10-CM | POA: Diagnosis not present

## 2023-08-29 DIAGNOSIS — E1165 Type 2 diabetes mellitus with hyperglycemia: Secondary | ICD-10-CM | POA: Diagnosis not present

## 2023-08-30 ENCOUNTER — Ambulatory Visit: Admitting: Dietician

## 2023-08-31 ENCOUNTER — Encounter: Payer: Self-pay | Attending: Family Medicine | Admitting: Dietician

## 2023-08-31 ENCOUNTER — Encounter: Payer: Self-pay | Admitting: Dietician

## 2023-08-31 VITALS — Ht 71.5 in | Wt 246.6 lb

## 2023-08-31 DIAGNOSIS — E669 Obesity, unspecified: Secondary | ICD-10-CM | POA: Insufficient documentation

## 2023-08-31 NOTE — Progress Notes (Signed)
 Supervised Weight Loss Visit Bariatric Nutrition Education Appt Start Time:  0802   End Time: 0822  Planned surgery: RYGB Pt expectation of surgery: get off insulin   6 out of 6 SWL Appointments   Pt completed visits.   Pt has cleared nutrition requirements.   NUTRITION ASSESSMENT   Anthropometrics  Start weight at [facility]: 262.3 lbs (date: 03/26/2023) Height: 71.5 in Weight today: 246.6 lb  BMI: 33.91 kg/m2     Clinical   Pharmacotherapy: History of weight loss medication used: Mounjaro , Ozempic  (not tolerated)  Medical hx: cancer, T2DM, HTN, asthma, obesity, sleep apnea, heart attack Medications: Praluent , entresol, metoprolol  succinate, furosemide , venlafaxine  XR, Lantuss, alprazolam , Advair , Mounjaro  Labs: A1c 9.2 Notable signs/symptoms: none noted Any previous deficiencies? No  Lifestyle & Dietary Hx  Pt states she is doing well not snacking in between with junk food. Pt states she can still do better. Pt states she is doing great with water intake. Pt states she needs to focus more on portion sizes, stating she has done well, but can do better. Pt realizes that she is below 35 BMI, and may not qualify for surgery. Pt states it is a good thing if she can keep losing weight. Pt states she is still on Mounjaro , stating she is a 5 mg, stating she is going to 7.5 mg next week. Pt states her provider for sleep apnea told her that she does not need to use her CPAP machine anymore, stating she only had a couple of episodes without it.  Estimated daily fluid intake: 64+ oz Supplements: multivitamin daily Current average weekly physical activity: gym 4 days a week for 90-120 minutes  No lunch no breakfast 24-Hr Dietary Recall First Meal: skipped or Protein drink, 2 pieces of toast w/ butter with coffee sugar free creamer and 2 splenda packets Snack: Protein drink Second Meal: skipped or apple or [place] sandwich with cheese or jumblia OR Kale with onions with meatloaf Snack:  cold stone ice cream Third Meal:  chicken breast baked chicken with  sour sauce and vegetables OR salad Snack: popcorn, individual serving of ice cream, chips, apple crisps, mixed fruit Beverages: water, coffee with Splenda and sugar free creamer   Estimated Energy Needs Calories: 1600  NUTRITION DIAGNOSIS  Overweight/obesity ([place]-3.3) related to past poor dietary habits and physical inactivity as evidenced by patient w/ planned RYGB surgery following dietary guidelines for continued weight loss.  NUTRITION INTERVENTION  Nutrition counseling (C-1) and education (E-2) to facilitate bariatric surgery goals.  Eat regularly throughout the day.   Avoid eating very large portions of food only 1-2 times per day.   Eat 3 small meals and 2-3 small snacks at regularly scheduled intervals throughout the day.   Avoid skipping meals.   Avoid "grazing" or nibbling throughout the day.   Eat slowly and stop eating when you feel comfortably full.   Chew each bite 20-30 times before swallowing--think pureed consistency.   Put down your utensil between each bite and wait to see how your stomach feels before taking another bite.   Don't eat too quickly. Meals should last about 20-30 minutes.   Stop eating as soon as you feel comfortably satisfied, not overfull. Fullness may feel like a pressure in your upper stomach. This can contribute to pain, nausea and vomiting.   Pre-Op Goals Progress & New Goals Continue: eat to eat every 3 to 5 hours; avoid skipping meals Continue: taking 20-30 minutes to eat Continue: to aim for satisfaction before fullness; get rid  of distractions; listen to our body Continue: to work on wait 30 minutes after eating to drink fluids (stop 15 minutes before, during and 30 minutes) Continue: meal planning, plan to have a protein with every meal and snack: use container for meal prepping. Continue: doing great with physical activity. Continue: focus on scheduled meals; use alarms on  phone; try more food for breakfast (boiled egg or yogurt for protein); avoid grazing Continue: increase non-starchy vegetables; aim for 2 or more servings per day.  Handouts Provided Include  Protein Shake Criteria for 2 week Modified Full Liquid Phase after surgery.  Learning Style & Readiness for Change Teaching method utilized: Visual & Auditory  Demonstrated degree of understanding via: Teach Back  Readiness Level: preparation Barriers to learning/adherence to lifestyle change: nothing identified  RD's Notes for next Visit  Patient progress toward chosen goals  MONITORING & EVALUATION Dietary intake, weekly physical activity, body weight, and pre-op goals in 1 month.   Next Steps  Pt has completed visits. No further supervised visits required/recommended. Patient is to return to [facility] for pre-op class >2 weeks prior to scheduled surgery.

## 2023-09-02 DIAGNOSIS — G4733 Obstructive sleep apnea (adult) (pediatric): Secondary | ICD-10-CM | POA: Diagnosis not present

## 2023-09-06 DIAGNOSIS — G4761 Periodic limb movement disorder: Secondary | ICD-10-CM | POA: Diagnosis not present

## 2023-09-27 DIAGNOSIS — Z6833 Body mass index (BMI) 33.0-33.9, adult: Secondary | ICD-10-CM | POA: Diagnosis not present

## 2023-09-27 DIAGNOSIS — E66811 Obesity, class 1: Secondary | ICD-10-CM | POA: Diagnosis not present

## 2023-09-27 DIAGNOSIS — E1165 Type 2 diabetes mellitus with hyperglycemia: Secondary | ICD-10-CM | POA: Diagnosis not present

## 2023-09-27 DIAGNOSIS — I10 Essential (primary) hypertension: Secondary | ICD-10-CM | POA: Diagnosis not present

## 2023-09-27 DIAGNOSIS — G4733 Obstructive sleep apnea (adult) (pediatric): Secondary | ICD-10-CM | POA: Diagnosis not present

## 2023-10-03 DIAGNOSIS — G4733 Obstructive sleep apnea (adult) (pediatric): Secondary | ICD-10-CM | POA: Diagnosis not present

## 2023-10-16 DIAGNOSIS — G4733 Obstructive sleep apnea (adult) (pediatric): Secondary | ICD-10-CM | POA: Diagnosis not present

## 2023-10-16 DIAGNOSIS — I10 Essential (primary) hypertension: Secondary | ICD-10-CM | POA: Diagnosis not present

## 2023-10-19 ENCOUNTER — Encounter (HOSPITAL_BASED_OUTPATIENT_CLINIC_OR_DEPARTMENT_OTHER): Payer: Self-pay

## 2023-10-23 ENCOUNTER — Other Ambulatory Visit (HOSPITAL_BASED_OUTPATIENT_CLINIC_OR_DEPARTMENT_OTHER): Payer: Self-pay | Admitting: Family

## 2023-10-24 ENCOUNTER — Encounter (HOSPITAL_BASED_OUTPATIENT_CLINIC_OR_DEPARTMENT_OTHER): Admitting: Family

## 2023-10-29 DIAGNOSIS — H20011 Primary iridocyclitis, right eye: Secondary | ICD-10-CM | POA: Diagnosis not present

## 2023-10-30 DIAGNOSIS — E1165 Type 2 diabetes mellitus with hyperglycemia: Secondary | ICD-10-CM | POA: Diagnosis not present

## 2023-10-30 DIAGNOSIS — I10 Essential (primary) hypertension: Secondary | ICD-10-CM | POA: Diagnosis not present

## 2023-10-30 DIAGNOSIS — Z6834 Body mass index (BMI) 34.0-34.9, adult: Secondary | ICD-10-CM | POA: Diagnosis not present

## 2023-10-30 DIAGNOSIS — I5022 Chronic systolic (congestive) heart failure: Secondary | ICD-10-CM | POA: Diagnosis not present

## 2023-10-30 DIAGNOSIS — E66811 Obesity, class 1: Secondary | ICD-10-CM | POA: Diagnosis not present

## 2023-11-02 DIAGNOSIS — G4733 Obstructive sleep apnea (adult) (pediatric): Secondary | ICD-10-CM | POA: Diagnosis not present

## 2023-11-05 DIAGNOSIS — H20011 Primary iridocyclitis, right eye: Secondary | ICD-10-CM | POA: Diagnosis not present

## 2023-11-07 DIAGNOSIS — G4733 Obstructive sleep apnea (adult) (pediatric): Secondary | ICD-10-CM | POA: Diagnosis not present

## 2023-11-07 DIAGNOSIS — I2510 Atherosclerotic heart disease of native coronary artery without angina pectoris: Secondary | ICD-10-CM | POA: Diagnosis not present

## 2023-11-07 DIAGNOSIS — Z794 Long term (current) use of insulin: Secondary | ICD-10-CM | POA: Diagnosis not present

## 2023-11-07 DIAGNOSIS — E1129 Type 2 diabetes mellitus with other diabetic kidney complication: Secondary | ICD-10-CM | POA: Diagnosis not present

## 2023-11-07 DIAGNOSIS — I10 Essential (primary) hypertension: Secondary | ICD-10-CM | POA: Diagnosis not present

## 2023-11-07 DIAGNOSIS — E66812 Obesity, class 2: Secondary | ICD-10-CM | POA: Diagnosis not present

## 2023-11-07 DIAGNOSIS — R809 Proteinuria, unspecified: Secondary | ICD-10-CM | POA: Diagnosis not present

## 2023-11-19 DIAGNOSIS — Z87898 Personal history of other specified conditions: Secondary | ICD-10-CM | POA: Diagnosis not present

## 2023-11-19 DIAGNOSIS — Z9009 Acquired absence of other part of head and neck: Secondary | ICD-10-CM | POA: Diagnosis not present

## 2023-11-19 DIAGNOSIS — E041 Nontoxic single thyroid nodule: Secondary | ICD-10-CM | POA: Diagnosis not present

## 2023-11-19 DIAGNOSIS — E236 Other disorders of pituitary gland: Secondary | ICD-10-CM | POA: Diagnosis not present

## 2023-11-19 DIAGNOSIS — E1165 Type 2 diabetes mellitus with hyperglycemia: Secondary | ICD-10-CM | POA: Diagnosis not present

## 2023-11-19 DIAGNOSIS — E669 Obesity, unspecified: Secondary | ICD-10-CM | POA: Diagnosis not present

## 2023-11-19 DIAGNOSIS — Z794 Long term (current) use of insulin: Secondary | ICD-10-CM | POA: Diagnosis not present

## 2023-11-19 DIAGNOSIS — Z9889 Other specified postprocedural states: Secondary | ICD-10-CM | POA: Diagnosis not present

## 2023-11-20 ENCOUNTER — Ambulatory Visit (INDEPENDENT_AMBULATORY_CARE_PROVIDER_SITE_OTHER): Payer: Self-pay | Admitting: Licensed Clinical Social Worker

## 2023-11-20 DIAGNOSIS — Z87898 Personal history of other specified conditions: Secondary | ICD-10-CM | POA: Diagnosis not present

## 2023-11-20 DIAGNOSIS — E041 Nontoxic single thyroid nodule: Secondary | ICD-10-CM | POA: Diagnosis not present

## 2023-11-20 DIAGNOSIS — E1165 Type 2 diabetes mellitus with hyperglycemia: Secondary | ICD-10-CM | POA: Diagnosis not present

## 2023-11-20 DIAGNOSIS — F4322 Adjustment disorder with anxiety: Secondary | ICD-10-CM

## 2023-11-20 DIAGNOSIS — Z9889 Other specified postprocedural states: Secondary | ICD-10-CM | POA: Diagnosis not present
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
## 2023-11-22 ENCOUNTER — Ambulatory Visit (HOSPITAL_BASED_OUTPATIENT_CLINIC_OR_DEPARTMENT_OTHER): Admitting: Family

## 2023-11-22 ENCOUNTER — Encounter (HOSPITAL_BASED_OUTPATIENT_CLINIC_OR_DEPARTMENT_OTHER): Payer: Self-pay | Admitting: Family

## 2023-11-22 VITALS — BP 142/86 | HR 97 | Ht 71.5 in | Wt 256.9 lb

## 2023-11-22 DIAGNOSIS — I25118 Atherosclerotic heart disease of native coronary artery with other forms of angina pectoris: Secondary | ICD-10-CM | POA: Diagnosis not present

## 2023-11-22 DIAGNOSIS — I5020 Unspecified systolic (congestive) heart failure: Secondary | ICD-10-CM

## 2023-11-22 DIAGNOSIS — G4733 Obstructive sleep apnea (adult) (pediatric): Secondary | ICD-10-CM | POA: Diagnosis not present

## 2023-11-22 DIAGNOSIS — I10 Essential (primary) hypertension: Secondary | ICD-10-CM

## 2023-11-22 DIAGNOSIS — E785 Hyperlipidemia, unspecified: Secondary | ICD-10-CM

## 2023-11-22 MED ORDER — FUROSEMIDE 40 MG PO TABS: 40.0000 mg | ORAL_TABLET | Freq: Every day | ORAL | 3 refills | Status: DC

## 2023-11-22 NOTE — Patient Instructions (Addendum)
 Medication Instructions:  Continue your current medications    Follow-Up: Please follow up in 6 months in [facility] with Dr. Raford, Reche Finder, NP or Kristin Alvstad PharmD

## 2023-11-22 NOTE — Progress Notes (Signed)
 Advanced Hypertension Assessment:    Date:  11/22/2023   ID:  Destiny Beasley, DOB [birth year], MRN [id number]  PCP:  Okey Carlin Redbird, MD  Cardiologist:  Ozell Fell, MD / Dr. [facility] Nephrologist:  Referring MD: Okey Carlin Redbird, MD   CC: Hypertension  History of Present Illness:    Destiny Beasley is a 68 y.o. female with a hx of CAD s/p inferior-lateral STEMI 06/2017 with DES to LCx, HFrEF, ICM, DM2, hyperthyroidism, TIA, OSA, breast cancer, HTN, HLD with statin intolerance, COVID19, pituitary tumor s/p resection, meningioma, anxiety  here to follow up in the [facility].   CAD dates back to inf-lat STEMI 06/2017 with DES to pLCx. Cardiac catheterization 04/2020 with patent LCx stent and pLAD with 40% stenosis. Her LVEF since her STEMI has been 40-45%.    Via telemedicine 08/2020 started on Lasix . Seen 10/08/20 with complaint of hot flashes which she attributed to Carvedilol . As leaving office developed chest pain and transported to ED - symptoms resolved with nitroglycerin  and Xanax , Hs-troponin negative x2, d-dimer minimally elevated 0.62 though negative when adjusted for age. Head CT unremarkable.    ED visit 10/19/20 was COVID positive treated with antiviral. Visit 11/01/20, 12/14/20, and 12/28/20 with optimization of antihypertensive regimen.  Seen 01/07/21 and Entresto  increased to 97-103mg  BID. Did not tolerate increased dose and reduce to 49-51mg  BID. 07/2021 she was started on doxazosin . Echo 07/2021 LVEF 45-50%, hypokinesis inferolateral wall, gr1DDtrivial MR. She was referred to [facility].   Established with [facility] 09/2021. Hypertension initially diagnosed in her 30s and has always been difficult to control. Enrolled in [employer]. Referred to [facility]. Metoprolol  transitioned to Bisoprolol . She self discontinued after 2 weeks due to muscle pain.    11/03/21 Hydralazine  25mg  BID started by pharmacy team. She discontinued due to  reported chest pain. She was started on Minoxidil  11/07/21. She self discontinued due to muscle pain, cramps. Seen 12/07/21 and Lasix  increased to 40mg  for BP control.   At visit 09/21/22 Entresto  increased on 97-103 milligrams twice daily.  Renal duplex ordered and performed 11/27/2022 with no evidence of RAS. At follow up 11/30/22 noted she was not tolerating the higher dose Entresto  and had returned to 49-51mg  BID dose.   At visit 06/28/23 she was recommended to resume Jardiance  10mg  daily but did not tolerate with yeast infection.   Presents today for follow-up. Still pending weight loss surgery. Completed nutrition and psychology eval. No longer taking Mounjaro  due to tolerance issues. Reports no shortness of breath nor dyspnea on exertion. Reports no chest pain, pressure, or tightness. No edema, orthopnea, PND. Reports no palpitations.  BP at home as low as 120/72 the other day more often 144/80. She has been wearing BIPAP only intermittently.   Previous antihypertensives: Amlodipine  (muscle pain) Coreg  (wheeze with 25mg  dose) Chlorthalidone  (leg pain) Spironolactone  (wheeze/fatigue) Jardiance  (hallucinations, yeast infections)  Entresto  (intolerant to 97-103mg  dose with cramps, tolerates 49-51mg  dose) Hydralazine  (fatigue, chest pain) Imdur  (wheeze) Doxazosin  (leg cramps) Minoxidil  (muscle pain, cramps) Metoprolol  (worsening of nightmares on doses higher than 25mg )  Past Medical History:  Diagnosis Date   Anemia    Anxiety    CAD in native artery    a. Inf STEMI 06/2017 complicated by cardiogenic shock/complete heart block s/p emergent cath showing culprit large dominant LCx s/p aspiration thrombectomy and DES, EF 50% by cath, 40-45% by echo.   Chronic systolic heart failure (HCC) 06/25/2017   Echo 11/19: mild LVH, EF 40-45,  inf-lat HK, Gr 1 DD, trivial MR   Complete heart block, transient (HCC)    a. 06/2017 in setting of acute inf MI -> resolved after PCI.   COVID-19    Diabetes mellitus     Goiter    History of radiation therapy 06/14/2016 - 07/19/2016   Right Breast 50 Gy 25 fractions   Hypercholesteremia    Hypertension    Ischemic cardiomyopathy    a. EF 50% by cath and 40-45% by echo 06/2017.   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   Meningioma [facility])    Nuclear stress test    Nuclear stress test 10/19: EF 35, inferolateral, apical scar; inferior, apical inferior infarct with mild peri-infarct ischemia; high risk   Obesity    Personal history of chemotherapy    Personal history of radiation therapy    Pituitary tumor    Seasonal asthma    Sleep apnea    does not use every night   Stroke [facility])    TIA - Dec 1 st, 2017    Past Surgical History:  Procedure Laterality Date   ABDOMINAL HYSTERECTOMY     BRAIN MENINGIOMA EXCISION  2005   BREAST BIOPSY     BREAST LUMPECTOMY Right    2017   BREAST LUMPECTOMY WITH RADIOACTIVE SEED LOCALIZATION Right 04/20/2016   Procedure: RIGHT BREAST LUMPECTOMY WITH RADIOACTIVE SEED LOCALIZATION;  Surgeon: Elon Pacini, MD;  Location: [facility];  Service: General;  Laterality: Right;   BREAST REDUCTION SURGERY Bilateral 10/23/2016   Procedure: BILATERAL BREAST REDUCTION WITH LIPOSUCTION ASSISTANCE;  Surgeon: Marcus Lung, MD;  Location: [facility];  Service: Plastics;  Laterality: Bilateral;   CESAREAN SECTION     CORONARY STENT INTERVENTION N/A 06/25/2017   Procedure: CORONARY STENT INTERVENTION;  Surgeon: Wonda Sharper, MD;  Location: [facility] INVASIVE CV LAB;  Service: Cardiovascular;  Laterality: N/A;   CORONARY/GRAFT ACUTE MI REVASCULARIZATION N/A 06/25/2017   Procedure: Coronary/Graft Acute MI Revascularization;  Surgeon: Wonda Sharper, MD;  Location: [facility] INVASIVE CV LAB;  Service: Cardiovascular;  Laterality: N/A;   FOOT SURGERY Bilateral 2016   hammer toe surgery   LEFT HEART CATH AND CORONARY  ANGIOGRAPHY N/A 06/25/2017   Procedure: LEFT HEART CATH AND CORONARY ANGIOGRAPHY;  Surgeon: Wonda Sharper, MD;  Location: [facility] INVASIVE CV LAB;  Service: Cardiovascular;  Laterality: N/A;   LEFT HEART CATH AND CORONARY ANGIOGRAPHY N/A 05/13/2020   Procedure: LEFT HEART CATH AND CORONARY ANGIOGRAPHY;  Surgeon: Court Dorn PARAS, MD;  Location: [facility] INVASIVE CV LAB;  Service: Cardiovascular;  Laterality: N/A;   MENISCUS REPAIR Left 2015   PITUITARY SURGERY     REDUCTION MAMMAPLASTY     RIGHT HEART CATH N/A 06/25/2017   Procedure: RIGHT HEART CATH;  Surgeon: Wonda Sharper, MD;  Location: [facility] INVASIVE CV LAB;  Service: Cardiovascular;  Laterality: N/A;   THYROID  SURGERY      Current Medications: Current Meds  Medication Sig   ADVAIR  HFA 115-21 MCG/ACT inhaler as needed.   ALPRAZolam  (XANAX ) 0.25 MG tablet Take 0.25 mg by mouth as needed.   aspirin  EC 81 MG tablet Take 1 tablet (81 mg total) by mouth daily.   Continuous Blood Gluc Sensor (FREESTYLE LIBRE 3 SENSOR) MISC APPLY 1 SENSOR TO UPPER ARM  EVERY 14 DAYS FOR CONTINUOUS  GLUCOSE MONITORING   furosemide  (LASIX ) 40 MG tablet TAKE 1 TABLET(40 MG) BY  MOUTH DAILY   glucose blood (FREESTYLE TEST STRIPS) test strip Use as instructed   HUMALOG  KWIKPEN 100 UNIT/ML KwikPen Inject 10 Units into the skin 3 (three) times daily.   INPEN 100-GREY-LILLY-HUMALOG  DEVI    insulin  glargine (LANTUS  SOLOSTAR) 100 UNIT/ML Solostar Pen Inject 80 Units into the skin daily.   LANTUS  SOLOSTAR 100 UNIT/ML Solostar Pen INJECT SUBCUTANEOUSLY 70  UNITS DAILY (Patient taking differently: Inject 64 Units into the skin daily.)   metoprolol  succinate (TOPROL -XL) 25 MG 24 hr tablet Take 1 tablet (25 mg total) by mouth daily. Take with or immediately following a meal.   MOUNJARO  5 MG/0.5ML Pen Inject 5 mg into the skin once a week.   nitroGLYCERIN  (NITROSTAT ) 0.4 MG SL tablet Place 1 tablet (0.4 mg total) under the tongue every 5 (five) minutes as needed for chest pain.    ondansetron  (ZOFRAN -ODT) 4 MG disintegrating tablet ondansetron  4 mg disintegrating tablet  TAKE 1 TABLET BY MOUTH EVERY 4 HOURS AS NEEDED FOR NAUSEA   PRALUENT  75 MG/ML SOAJ ADMINISTER 1 ML (75 MG) UNDER THE SKIN EVERY 14 DAYS   sacubitril -valsartan  (ENTRESTO ) 49-51 MG Take 1 tablet by mouth 2 (two) times daily.   valACYclovir  (VALTREX ) 1000 MG tablet Take 1,000 mg by mouth 2 (two) times daily as needed (infection).   venlafaxine  XR (EFFEXOR -XR) 75 MG 24 hr capsule Take 75 mg by mouth daily with breakfast.   [DISCONTINUED] empagliflozin  (JARDIANCE ) 10 MG TABS tablet Take 1 tablet (10 mg total) by mouth daily before breakfast.     Allergies:   Hydrocodone , Invokana  [canagliflozin ], Amlodipine , Carvedilol , Chlorthalidone , Doxazosin , Evolocumab , Hydralazine , Imdur  [isosorbide  nitrate], Rosuvastatin , Semaglutide , Simvastatin, Spironolactone , Atorvastatin , and Compazine  [prochlorperazine ]   Social History   Socioeconomic History   Marital status: Single    Spouse name: Not on file   Number of children: Not on file   Years of education: Not on file   Highest education level: Bachelor's degree (e.g., BA, AB, BS)  Occupational History   Occupation: full time  Tobacco Use   Smoking status: Never   Smokeless tobacco: Never  Vaping Use   Vaping status: Never Used  Substance and Sexual Activity   Alcohol use: Not Currently    Comment: occ   Drug use: No   Sexual activity: Not on file    Comment: no cycles; pt had hyst  Other Topics Concern   Not on file  Social History Narrative   Lives alone   Right Handed   Drinks 1-2 cups caffeine daily   Social Drivers of Health   Financial Resource Strain: Low Risk  (10/04/2021)   Overall Financial Resource Strain (CARDIA)    Difficulty of Paying Living Expenses: Not hard at all  Food Insecurity: No Food Insecurity (10/04/2021)   Hunger Vital Sign    Worried About Running Out of Food in the Last Year: Never true    Ran Out of Food in the Last  Year: Never true  Transportation Needs: No Transportation Needs (10/04/2021)   PRAPARE - Administrator, Civil Service (Medical): No    Lack of Transportation (Non-Medical): No  Physical Activity: Inactive (10/04/2021)   Exercise Vital Sign    Days of Exercise per Week: 0 days    Minutes of Exercise per Session: 0 min  Stress: Not on file  Social Connections: Not on file     Family History: The patient's family history includes Alcohol abuse in her brother; Diabetes in her mother; Heart attack in her mother;  Hyperlipidemia in her mother; Hypertension in her mother and sister; Stroke in her maternal uncle.  ROS:   Please see the history of present illness.     All other systems reviewed and are negative.  EKGs/Labs/Other Studies Reviewed:         Cardiac Studies & Procedures   ______________________________________________________________________________________________ CARDIAC CATHETERIZATION  CARDIAC CATHETERIZATION 05/13/2020  Conclusion Images from the original result were not included.   Previously placed Mid Cx to Dist Cx stent (unknown type) is widely patent.  Prox LAD lesion is 40% stenosed.  There is mild to moderate left ventricular systolic dysfunction.  LV end diastolic pressure is normal.  The left ventricular ejection fraction is 45-50% by visual estimate.  Destiny Beasley is a 68 y.o. female   [id number] LOCATION:  FACILITY: [facility] PHYSICIAN: Dorn Lesches, M.D. [birth year]   DATE OF PROCEDURE:  05/13/2020  DATE OF DISCHARGE:     CARDIAC CATHETERIZATION    History obtained from chart review. Destiny Beasley is a 68 y.o. female with coronary artery disease s/p DES to proximal circumflex, chronic systolic heart failure/ischemic cardiomyopathy, hypertension, hyperlipidemia with statin intolerance, diabetes mellitus, obstructive sleep apnea on BiPAP, TIA, breast cancer and history of COVID-11 July 2019 presented for chest  pressure.  History of inferior lateral STEMI in March 2019 complicated by cardiogenic shock, acute heart failure and complete heart block.  Underwent PCI with DES to circumflex.  EF was 40 to 45% at that time.  Unable to tolerate Entresto  in the past.  She was admitted yesterday with unstable angina.  Her enzymes were negative and her EKG showed no acute changes.  She was placed on IV heparin .  She presents now for diagnostic coronary angiography.  Impression Ms. Northrup had a dominant circumflex stent was widely patent.  She has mild proximal LAD disease.  She has mild LV dysfunction with an EF of approximate 45 to 50% with inferoapical hypokinesia.  There were no culprit lesions identifiable that would contribute to chest pain.  Recommend continued medical therapy.  The sheath was removed and a TR band was placed on the right wrist to achieve patent hemostasis.  The patient left the lab in stable condition.  Dorn Lesches. MD, [facility] 05/13/2020 8:26 AM  Findings Coronary Findings Diagnostic  Dominance: Left  Left Anterior Descending Prox LAD lesion is 40% stenosed.  Left Circumflex Previously placed Mid Cx to Dist Cx stent (unknown type) is widely patent.  Intervention  No interventions have been documented.   CARDIAC CATHETERIZATION  CARDIAC CATHETERIZATION 06/25/2017  Conclusion  There is mild to moderate left ventricular systolic dysfunction.  LV end diastolic pressure is moderately elevated.  The left ventricular ejection fraction is 45-50% by visual estimate.  Prox LAD to Mid LAD lesion is 30% stenosed.  Prox Cx lesion is 100% stenosed.  A drug-eluting stent was successfully placed using a STENT SYNERGY DES 4X20.  Post intervention, there is a 0% residual stenosis.  1.  Acute inferolateral STEMI involving a large, dominant left circumflex complicated by cardiogenic shock and complete heart block 2.  Successful PCI of the left circumflex using aspiration  thrombectomy and drug-eluting stent implantation 3.  Mild nonobstructive disease in the left main, LAD, and nondominant RCA without significant stenosis in any of those vessels 4.  Mild segmental contraction abnormality of the left ventricle consistent with a left circumflex/inferior infarct, with mild segmental LV systolic dysfunction, LVEF estimated at 50% 5.  Acute systolic heart failure with elevated LVEDP and mildly elevated  right heart pressures  Recommendations: The patient will be transferred to the cardiac ICU for continued post MI care.  We will continue Cangrelor  for 2 hours.  She is loaded with Brilinta  180 mg at the completion of the procedure.  Post MI medical therapy will be instituted.  We will wean her off of dopamine  as tolerated.  Findings Coronary Findings Diagnostic  Dominance: Left  Left Main The vessel exhibits minimal luminal irregularities.  Left Anterior Descending The vessel exhibits minimal luminal irregularities. The LAD courses to the apex.  The vessel has mild diffuse nonobstructive disease without significant stenosis. Prox LAD to Mid LAD lesion is 30% stenosed.  Left Circumflex Prox Cx lesion is 100% stenosed. The lesion is heavily thrombotic. The circumflex is a large, dominant vessel.  The mid circumflex after a small first obtuse marginal branch is totally occluded with heavy thrombus.  Right Coronary Artery Vessel is small. The vessel exhibits minimal luminal irregularities. Nondominant vessel, minimal irregularity noted.  Intervention  Prox Cx lesion Stent Lesion crossed with guidewire using a WIRE COUGAR XT STRL 190CM. Pre-stent angioplasty was not performed. A drug-eluting stent was successfully placed using a STENT SYNERGY DES 4X20. Post-stent angioplasty was performed using a BALLOON [place] EMERGE MR 4.0X15. Maximum pressure:  16 atm. The circumflex is occluded with heavy thrombus.  Bivalirudin  and Cangrelor  are used for an anti-thrombotic strategy.   The patient is acutely ill and unable to swallow any oral antiplatelet therapy.  An EBU 3.5 cm guide catheter is utilized.  A therapeutic ACT is achieved.  A cougar wire is advanced across the occlusion without difficulty into the second obtuse marginal branch.  Aspiration thrombectomy is performed.  A total of 2 passes are made.  This restored TIMI-3 flow in the vessel.  There is some downstream thrombus and distal vessel occlusion noted in an OM subbranch and in the left PDA out very distal in that vessel.  The lesion is then stented with a 4.0 x 20 mm Synergy drug-eluting stent deployed at 14 atm. The stent is postdilated with a 4.0 mm noncompliant balloon to 16 atm.  There is an excellent angiographic result at the lesion site.  Distal flow is improved at the completion of the procedure.  The procedure is performed with the patient on 10 mics of dopamine .  By the completion of the procedure the dopamine  is turned down to 5 mics.  A right heart catheterization is performed after PCI to help determine whether further hemodynamic support is necessary.  Because of preserved cardiac output, hemodynamic support is deferred.  The right femoral artery is closed with a Perclose device.  There is a femoral venous sheath still in place. Post-Intervention Lesion Assessment There is a 0% residual stenosis post intervention.   STRESS TESTS  MYOCARDIAL PERFUSION IMAGING 02/12/2018  Interpretation Summary  Nuclear stress EF: 35%.  The left ventricular ejection fraction is moderately decreased (30-44%).  Blood pressure demonstrated a normal response to exercise.  There was no ST segment deviation noted during stress.  There is a medium defect of severe severity present in the basal inferolateral, mid inferolateral and apical lateral location. The defect is non-reversible and consistent with scar.  There is a medium defect of moderate severity present in the basal inferior, mid inferior and apical inferior  location. The defect is partially reversible and consistent with infarct with mild peri infarct ischemia.  This is a high risk study.   ECHOCARDIOGRAM  ECHOCARDIOGRAM COMPLETE 08/17/2021  Narrative ECHOCARDIOGRAM REPORT  Patient Name:   Destiny Beasley Date of Exam: 08/17/2021 Medical Rec #:  [id number]      Height:       72.0 in Accession #:    [id number]     Weight:       266.6 lb Date of Birth:  1955/08/09      BSA:          2.407 m Patient Age:    65 years       BP:           160/80 mmHg Patient Gender: F              HR:           74 bpm. Exam Location:  Outpatient  Procedure: 2D Echo, Cardiac Doppler and Color Doppler  Indications:    I50.23 Acute on chronic systolic (congestive) heart failure  History:        Patient has prior history of Echocardiogram examinations, most recent 05/13/2020. CHF and Cardiomyopathy, Previous Myocardial Infarction and CAD, TIA and Stroke; Risk Factors:Hypertension, Sleep Apnea, Diabetes, Dyslipidemia and Non-Smoker. Patient denies chest pain, SOB and leg edema. Patient had CoVid-19 x 2 most recently 2021.  Sonographer:    Annabella Cater RVT, RDCS (AE), RDMS Referring Phys: [phone] [facility]   Sonographer Comments: Suboptimal subcostal window, suboptimal apical window and patient is morbidly obese. Image acquisition challenging due to patient body habitus. IMPRESSIONS   1. Hypokinesis of the inferolateral wall with overall mild LV dysfunction. 2. Left ventricular ejection fraction, by estimation, is 45 to 50%. The left ventricle has mildly decreased function. The left ventricle demonstrates regional wall motion abnormalities (see scoring diagram/findings for description). Left ventricular diastolic parameters are consistent with Grade I diastolic dysfunction (impaired relaxation). 3. Right ventricular systolic function is normal. The right ventricular size is normal. 4. Left atrial size was mildly dilated. 5. The mitral valve is  normal in structure. Trivial mitral valve regurgitation. No evidence of mitral stenosis. 6. The aortic valve is tricuspid. Aortic valve regurgitation is not visualized. Aortic valve sclerosis is present, with no evidence of aortic valve stenosis.  Comparison(s): EF 40%,mild hypokinesis of the left ventricular, mid-apical inferior wall and inferolateral wall, trivial AI.  FINDINGS Left Ventricle: Left ventricular ejection fraction, by estimation, is 45 to 50%. The left ventricle has mildly decreased function. The left ventricle demonstrates regional wall motion abnormalities. The left ventricular internal cavity size was normal in size. There is no left ventricular hypertrophy. Left ventricular diastolic parameters are consistent with Grade I diastolic dysfunction (impaired relaxation).  Right Ventricle: The right ventricular size is normal. Right ventricular systolic function is normal.  Left Atrium: Left atrial size was mildly dilated.  Right Atrium: Right atrial size was normal in size.  Pericardium: Trivial pericardial effusion is present.  Mitral Valve: The mitral valve is normal in structure. Trivial mitral valve regurgitation. No evidence of mitral valve stenosis.  Tricuspid Valve: The tricuspid valve is normal in structure. Tricuspid valve regurgitation is trivial. No evidence of tricuspid stenosis.  Aortic Valve: The aortic valve is tricuspid. Aortic valve regurgitation is not visualized. Aortic valve sclerosis is present, with no evidence of aortic valve stenosis. Aortic valve mean gradient measures 2.0 mmHg. Aortic valve peak gradient measures 4.2 mmHg. Aortic valve area, by VTI measures 2.32 cm.  Pulmonic Valve: The pulmonic valve was normal in structure. Pulmonic valve regurgitation is not visualized. No evidence of pulmonic stenosis.  Aorta: The aortic root is normal in size  and structure.  Venous: The inferior vena cava was not well visualized.  IAS/Shunts: The  interatrial septum was not well visualized.  Additional Comments: Hypokinesis of the inferolateral wall with overall mild LV dysfunction.   LEFT VENTRICLE PLAX 2D LVIDd:         4.12 cm      Diastology LVIDs:         3.04 cm      LV e' medial:    5.87 cm/s LV PW:         1.05 cm      LV E/e' medial:  13.0 LV IVS:        0.70 cm      LV e' lateral:   9.25 cm/s LVOT diam:     2.00 cm      LV E/e' lateral: 8.3 LV SV:         52 LV SV Index:   22 LVOT Area:     3.14 cm  3D Volume EF: LV Volumes (MOD)            3D EF:        55 % LV vol d, MOD A2C: 105.0 ml LV EDV:       116 ml LV vol d, MOD A4C: 123.0 ml LV ESV:       52 ml LV vol s, MOD A2C: 48.8 ml  LV SV:        63 ml LV vol s, MOD A4C: 63.6 ml LV SV MOD A2C:     56.2 ml LV SV MOD A4C:     123.0 ml LV SV MOD BP:      59.5 ml  RIGHT VENTRICLE RV S prime:     11.90 cm/s TAPSE (M-mode): 2.0 cm  LEFT ATRIUM              Index        RIGHT ATRIUM          Index LA diam:        4.00 cm  1.66 cm/m   RA Area:     9.09 cm LA Vol (A2C):   128.0 ml 53.17 ml/m  RA Volume:   17.20 ml 7.14 ml/m LA Vol (A4C):   71.6 ml  29.74 ml/m LA Biplane Vol: 98.3 ml  40.83 ml/m AORTIC VALVE                    PULMONIC VALVE AV Area (Vmax):    2.31 cm     PV Vmax:          0.76 m/s AV Area (Vmean):   2.36 cm     PV Peak grad:     2.3 mmHg AV Area (VTI):     2.32 cm     PR End Diast Vel: 0.44 msec AV Vmax:           103.00 cm/s AV Vmean:          74.800 cm/s AV VTI:            0.225 m AV Peak Grad:      4.2 mmHg AV Mean Grad:      2.0 mmHg LVOT Vmax:         75.60 cm/s LVOT Vmean:        56.300 cm/s LVOT VTI:          0.166 m LVOT/AV VTI ratio: 0.74  AORTA Ao Root diam: 3.50 cm Ao Asc diam:  3.10 cm Ao Arch diam: 3.4 cm  MITRAL VALVE MV Area (PHT): 4.57 cm    SHUNTS MV Decel Time: 166 msec    Systemic VTI:  0.17 m MV E velocity: 76.50 cm/s  Systemic Diam: 2.00 cm MV A velocity: 86.00 cm/s MV E/A ratio:  0.89  Redell Shallow  MD Electronically signed by Redell Shallow MD Signature Date/Time: 08/17/2021/1:49:04 PM    Final          ______________________________________________________________________________________________       Recent Labs: 06/28/2023: ALT 18; BUN 14; Creatinine, Ser 0.85; Hemoglobin 14.2; Platelets 336; Potassium 4.5; Sodium 144   Recent Lipid Panel    Component Value Date/Time   CHOL 155 06/28/2023 1258   TRIG 89 06/28/2023 1258   HDL 61 06/28/2023 1258   CHOLHDL 2.5 06/28/2023 1258   CHOLHDL 2 08/12/2018 0944   VLDL 19.2 08/12/2018 0944   LDLCALC 77 06/28/2023 1258   LDLDIRECT 67 07/15/2020 1609   LDLDIRECT 87.0 06/04/2019 0835    Physical Exam:   VS:  BP (!) 144/88   Pulse 97   Ht 5' 11.5 (1.816 m)   Wt 256 lb 14.4 oz (116.5 kg)   SpO2 97%   BMI 35.33 kg/m  , BMI Body mass index is 35.33 kg/m. GENERAL:  Well appearing HEENT: Pupils equal round and reactive, fundi not visualized, oral mucosa unremarkable NECK:  No jugular venous distention, waveform within normal limits, carotid upstroke brisk and symmetric, no bruits, no thyromegaly LYMPHATICS:  No cervical adenopathy LUNGS:  Clear to auscultation bilaterally HEART:  RRR.  PMI not displaced or sustained,S1 and S2 within normal limits, no S3, no S4, no clicks, no rubs, no murmurs ABD:  Flat, positive bowel sounds normal in frequency in pitch, no bruits, no rebound, no guarding, no midline pulsatile mass, no hepatomegaly, no splenomegaly EXT:  2 plus pulses throughout, no edema, no cyanosis no clubbing SKIN:  No rashes no nodules NEURO:  Cranial nerves II through XII grossly intact, motor grossly intact throughout PSYCH:  Cognitively intact, oriented to person place and time   ASSESSMENT/PLAN:    Preop - pending weight loss surgery. Exercise tolerance >4 METS. May hold aspirin  7 days prior to procedure per office protocols. Per AHA/ACC guidelines, she is deemed acceptable risk for the planned procedure without  additional cardiovascular testing.   CAD/HLD, LDL goal less than 70- Stable with no anginal symptoms. No indication for ischemic evaluation.  Prior intolerance to atorvastatin , rosuvastatin , simvastatin with myalgias.  GDMT includes Praluent , Aspirin , Metoprolol . Heart healthy diet and regular cardiovascular exercise encouraged. 06/2023 LDL 77.    HFrEF / ICM - 05/13/20 LVEF 40-45%.  07/2021 LVEF 45-50%. GDMT includes Toprol  25 mg daily (previous intolerance to Carvedilol  and higher doses Toprol ), Entresto  49-51 mg twice daily, Lasix  40 mg daily.Intolerance to Jardiance . Heart healthy diet and regular cardiovascular exercise encouraged.  Educated to report weight gain of 2 lb overnight or 5 lb in 1 week. Lasix  refill provided.    HTN - Renal duplex 11/2022 with no stenosis.  Encouraged to resume wearing BIPAP regularly.  Secondary hypertension workup has been otherwise unremarkable. Multiple prior intolerances, detailed above, limit further medication titration. Previously discussed RDN, consider when procedure resumed. Also anticipate BP control will improve with weight loss after bariatric surgery. Continue present antihypertensive regimen Lasix  40mg  daily, Toprol  25mg  daily, Entresto  49-51mg  BID due to multiple prior intolerances and upcoming weight loss surgery.   Anxiety - Continue to follow with PCP.    OSA -  BIPAP compliance encouraged. Endorses wearing intermittently.   Screening for Secondary Hypertension:     10/04/2021    9:43 AM  Causes  Drugs/Herbals Screened     - Comments limitis sodium and caffeine.  Uses Aleve  bid.  Renovascular HTN Screened     - Comments No renal artery stenosis on CT 05/2021  Sleep Apnea Screened     - Comments uses BiPAP  Thyroid  Disease Screened  Hyperaldosteronism Screened     - Comments No adenomas on CT in 2023  Pheochromocytoma Screened     - Comments No adenomas or symptoms.  Cushing's Syndrome N/A  Hyperparathyroidism Screened  Coarctation of the  Aorta Screened     - Comments BP symmetric  Compliance Screened    Relevant Labs/Studies:    Latest Ref Rng & Units 06/28/2023   12:58 PM 12/22/2022   11:57 AM 10/04/2022    4:04 PM  Basic Labs  Sodium 134 - 144 mmol/L 144  137  142   Potassium 3.5 - 5.2 mmol/L 4.5  3.9  4.4   Creatinine 0.57 - 1.00 mg/dL 9.14  9.13  9.13        Latest Ref Rng & Units 01/18/2021    8:46 AM 11/30/2020    8:22 AM  Thyroid    TSH 0.35 - 5.50 uIU/mL 1.26  3.57              Latest Ref Rng & Units 11/11/2011    1:14 PM  Cortisol  Cortisol  ug/dL 9.6        1/87/7975   10:09 AM  Renovascular   Renal Artery US  Completed Yes      Disposition:    FU with PharmD in 6 months   Medication Adjustments/Labs and Tests Ordered: Current medicines are reviewed at length with the patient today.  Concerns regarding medicines are outlined above.  No orders of the defined types were placed in this encounter.  No orders of the defined types were placed in this encounter.   Signed, Reche GORMAN Finder, NP  11/22/2023 11:53 AM    [facility]

## 2023-11-27 DIAGNOSIS — Z9009 Acquired absence of other part of head and neck: Secondary | ICD-10-CM | POA: Diagnosis not present

## 2023-11-29 DIAGNOSIS — E66811 Obesity, class 1: Secondary | ICD-10-CM | POA: Diagnosis not present

## 2023-11-29 DIAGNOSIS — I5022 Chronic systolic (congestive) heart failure: Secondary | ICD-10-CM | POA: Diagnosis not present

## 2023-11-29 DIAGNOSIS — I10 Essential (primary) hypertension: Secondary | ICD-10-CM | POA: Diagnosis not present

## 2023-11-29 DIAGNOSIS — Z6834 Body mass index (BMI) 34.0-34.9, adult: Secondary | ICD-10-CM | POA: Diagnosis not present

## 2023-11-29 DIAGNOSIS — E1165 Type 2 diabetes mellitus with hyperglycemia: Secondary | ICD-10-CM | POA: Diagnosis not present

## 2023-11-30 DIAGNOSIS — E7800 Pure hypercholesterolemia, unspecified: Secondary | ICD-10-CM | POA: Diagnosis not present

## 2023-11-30 DIAGNOSIS — I10 Essential (primary) hypertension: Secondary | ICD-10-CM | POA: Diagnosis not present

## 2023-12-01 ENCOUNTER — Encounter (HOSPITAL_BASED_OUTPATIENT_CLINIC_OR_DEPARTMENT_OTHER): Payer: Self-pay

## 2023-12-03 DIAGNOSIS — G4733 Obstructive sleep apnea (adult) (pediatric): Secondary | ICD-10-CM | POA: Diagnosis not present

## 2023-12-04 DIAGNOSIS — J45909 Unspecified asthma, uncomplicated: Secondary | ICD-10-CM | POA: Diagnosis not present

## 2023-12-04 DIAGNOSIS — Z0000 Encounter for general adult medical examination without abnormal findings: Secondary | ICD-10-CM | POA: Diagnosis not present

## 2023-12-04 DIAGNOSIS — F410 Panic disorder [episodic paroxysmal anxiety] without agoraphobia: Secondary | ICD-10-CM | POA: Diagnosis not present

## 2023-12-04 DIAGNOSIS — Z23 Encounter for immunization: Secondary | ICD-10-CM | POA: Diagnosis not present

## 2023-12-04 DIAGNOSIS — D329 Benign neoplasm of meninges, unspecified: Secondary | ICD-10-CM | POA: Diagnosis not present

## 2023-12-10 ENCOUNTER — Telehealth: Payer: Self-pay

## 2023-12-10 DIAGNOSIS — G4733 Obstructive sleep apnea (adult) (pediatric): Secondary | ICD-10-CM | POA: Diagnosis not present

## 2023-12-10 NOTE — Telephone Encounter (Signed)
   Pre-operative Risk Assessment    Patient Name: Destiny Beasley  DOB: February 10, 1956 MRN: [id number]   Date of last office visit: 11/22/23 Date of next office visit: Not scheduled   Request for Surgical Clearance    Procedure:  Bariatric Surgery  Date of Surgery:  Clearance TBD                                Surgeon:  Herlene Bureau Surgeon's Group or Practice Name:  [facility] Phone number:  [phone] Fax number:  [phone]   Type of Clearance Requested:   - Medical    Type of Anesthesia:  General    Additional requests/questions:    Destiny Beasley   12/10/2023, 4:35 PM

## 2023-12-11 NOTE — Telephone Encounter (Signed)
 Deemed acceptable risk for the planned procedure on 11/22/2023 by Reche Finder.  Will fax over office note to requesting office.

## 2023-12-13 ENCOUNTER — Encounter (HOSPITAL_BASED_OUTPATIENT_CLINIC_OR_DEPARTMENT_OTHER): Admitting: Family

## 2023-12-23 ENCOUNTER — Other Ambulatory Visit (HOSPITAL_BASED_OUTPATIENT_CLINIC_OR_DEPARTMENT_OTHER): Payer: Self-pay | Admitting: Family

## 2023-12-27 DIAGNOSIS — H40053 Ocular hypertension, bilateral: Secondary | ICD-10-CM | POA: Diagnosis not present

## 2023-12-27 DIAGNOSIS — E66811 Obesity, class 1: Secondary | ICD-10-CM | POA: Diagnosis not present

## 2023-12-27 DIAGNOSIS — I5022 Chronic systolic (congestive) heart failure: Secondary | ICD-10-CM | POA: Diagnosis not present

## 2023-12-27 DIAGNOSIS — E1165 Type 2 diabetes mellitus with hyperglycemia: Secondary | ICD-10-CM | POA: Diagnosis not present

## 2023-12-27 DIAGNOSIS — I10 Essential (primary) hypertension: Secondary | ICD-10-CM | POA: Diagnosis not present

## 2023-12-27 DIAGNOSIS — Z6834 Body mass index (BMI) 34.0-34.9, adult: Secondary | ICD-10-CM | POA: Diagnosis not present

## 2023-12-28 DIAGNOSIS — E0590 Thyrotoxicosis, unspecified without thyrotoxic crisis or storm: Secondary | ICD-10-CM | POA: Diagnosis not present

## 2024-01-03 DIAGNOSIS — G4733 Obstructive sleep apnea (adult) (pediatric): Secondary | ICD-10-CM | POA: Diagnosis not present

## 2024-01-07 ENCOUNTER — Encounter: Attending: General Surgery | Admitting: Skilled Nursing Facility1

## 2024-01-07 DIAGNOSIS — E119 Type 2 diabetes mellitus without complications: Secondary | ICD-10-CM | POA: Insufficient documentation

## 2024-01-07 DIAGNOSIS — E669 Obesity, unspecified: Secondary | ICD-10-CM | POA: Diagnosis not present

## 2024-01-07 NOTE — Progress Notes (Signed)
 Pre-Operative Nutrition Class:    Patient was seen on 01/07/2024 for Pre-Operative Bariatric Surgery Education at the [facility].    Surgery date:  Surgery type: RYGB Start weight at [facility]: 262.2 Weight today: 255  Samples given per MNT protocol. Patient educated on appropriate usage:  Celebrate drink Lot [id number] Exp: 11/24/25  The following the learning objectives were met by the patient during this course: Identify Pre-Op Dietary Goals and will begin 2 weeks pre-operatively Identify appropriate sources of fluids and proteins  State protein recommendations and appropriate sources pre and post-operatively Identify Post-Operative Dietary Goals and will follow for 2 weeks post-operatively Identify appropriate multivitamin and calcium  sources Describe the need for physical activity post-operatively and will follow MD recommendations State when to call healthcare provider regarding medication questions or post-operative complications When having a diagnosis of diabetes understanding hypoglycemia symptoms and the inclusion of 1 complex carbohydrate per meal  Handouts given during class include: Pre-Op Bariatric Surgery Diet Handout Protein Shake Handout Post-Op Bariatric Surgery Nutrition Handout BELT Program Information Flyer Support Group Information Flyer [facility] Bariatric Supplements Price List  Follow-Up Plan: Patient will follow-up at [facility] 2 weeks post operatively for diet advancement per MD.

## 2024-01-09 ENCOUNTER — Encounter (HOSPITAL_BASED_OUTPATIENT_CLINIC_OR_DEPARTMENT_OTHER): Payer: Self-pay

## 2024-01-14 ENCOUNTER — Telehealth: Payer: Self-pay | Admitting: Pharmacy Technician

## 2024-01-14 NOTE — Telephone Encounter (Signed)
   Pharmacy Patient Advocate Encounter   Received notification from Onbase that prior authorization for praluent  is required/requested.   Insurance verification completed.   The patient is insured through [facility] AD[employer]/RX ADVANCE .   Per test claim: PA required; PA submitted to above mentioned insurance via Latent Key/confirmation #/EOC [id number] Status is pending

## 2024-01-15 NOTE — Telephone Encounter (Signed)
 Pharmacy Patient Advocate Encounter  Received notification from [employer] that Prior Authorization for praluent  has been APPROVED from 01/14/24 to 01/13/25   PA #/Case ID/Reference #: [id number]

## 2024-01-18 DIAGNOSIS — M7711 Lateral epicondylitis, right elbow: Secondary | ICD-10-CM | POA: Diagnosis not present

## 2024-01-29 DIAGNOSIS — N6482 Hypoplasia of breast: Secondary | ICD-10-CM | POA: Diagnosis not present

## 2024-02-02 DIAGNOSIS — G4733 Obstructive sleep apnea (adult) (pediatric): Secondary | ICD-10-CM | POA: Diagnosis not present

## 2024-02-07 ENCOUNTER — Other Ambulatory Visit (HOSPITAL_COMMUNITY): Payer: Self-pay | Admitting: General Surgery

## 2024-02-07 DIAGNOSIS — R1310 Dysphagia, unspecified: Secondary | ICD-10-CM

## 2024-02-11 ENCOUNTER — Other Ambulatory Visit (HOSPITAL_BASED_OUTPATIENT_CLINIC_OR_DEPARTMENT_OTHER): Payer: Self-pay | Admitting: Family

## 2024-02-14 DIAGNOSIS — E041 Nontoxic single thyroid nodule: Secondary | ICD-10-CM | POA: Diagnosis not present

## 2024-02-14 DIAGNOSIS — E66812 Obesity, class 2: Secondary | ICD-10-CM | POA: Diagnosis not present

## 2024-02-14 DIAGNOSIS — I5022 Chronic systolic (congestive) heart failure: Secondary | ICD-10-CM | POA: Diagnosis not present

## 2024-02-14 DIAGNOSIS — E236 Other disorders of pituitary gland: Secondary | ICD-10-CM | POA: Diagnosis not present

## 2024-02-14 DIAGNOSIS — Z6835 Body mass index (BMI) 35.0-35.9, adult: Secondary | ICD-10-CM | POA: Diagnosis not present

## 2024-02-14 DIAGNOSIS — I10 Essential (primary) hypertension: Secondary | ICD-10-CM | POA: Diagnosis not present

## 2024-02-14 DIAGNOSIS — Z9889 Other specified postprocedural states: Secondary | ICD-10-CM | POA: Diagnosis not present

## 2024-02-14 DIAGNOSIS — E1165 Type 2 diabetes mellitus with hyperglycemia: Secondary | ICD-10-CM | POA: Diagnosis not present

## 2024-02-14 DIAGNOSIS — Z794 Long term (current) use of insulin: Secondary | ICD-10-CM | POA: Diagnosis not present

## 2024-02-14 DIAGNOSIS — Z9009 Acquired absence of other part of head and neck: Secondary | ICD-10-CM | POA: Diagnosis not present

## 2024-02-14 DIAGNOSIS — Z87898 Personal history of other specified conditions: Secondary | ICD-10-CM | POA: Diagnosis not present

## 2024-02-15 DIAGNOSIS — Z794 Long term (current) use of insulin: Secondary | ICD-10-CM | POA: Diagnosis not present

## 2024-02-15 DIAGNOSIS — E1165 Type 2 diabetes mellitus with hyperglycemia: Secondary | ICD-10-CM | POA: Diagnosis not present

## 2024-02-15 DIAGNOSIS — E66812 Obesity, class 2: Secondary | ICD-10-CM | POA: Diagnosis not present

## 2024-02-18 ENCOUNTER — Ambulatory Visit: Payer: Self-pay | Admitting: General Surgery

## 2024-02-18 DIAGNOSIS — Z01818 Encounter for other preprocedural examination: Secondary | ICD-10-CM

## 2024-02-18 DIAGNOSIS — E66812 Obesity, class 2: Secondary | ICD-10-CM

## 2024-02-19 ENCOUNTER — Other Ambulatory Visit: Payer: PPO

## 2024-02-25 ENCOUNTER — Ambulatory Visit (HOSPITAL_BASED_OUTPATIENT_CLINIC_OR_DEPARTMENT_OTHER)
Admission: RE | Admit: 2024-02-25 | Discharge: 2024-02-25 | Disposition: A | Discharge status: Home / Self Care | Source: Ambulatory Visit | Attending: Obstetrics and Gynecology | Admitting: Obstetrics and Gynecology

## 2024-02-25 DIAGNOSIS — Z780 Asymptomatic menopausal state: Secondary | ICD-10-CM | POA: Insufficient documentation

## 2024-02-25 DIAGNOSIS — Z13820 Encounter for screening for osteoporosis: Secondary | ICD-10-CM | POA: Diagnosis not present

## 2024-02-26 ENCOUNTER — Ambulatory Visit (HOSPITAL_COMMUNITY)
Admission: RE | Admit: 2024-02-26 | Discharge: 2024-02-26 | Disposition: A | Discharge status: Home / Self Care | Source: Ambulatory Visit | Attending: General Surgery | Admitting: General Surgery

## 2024-02-26 DIAGNOSIS — R1310 Dysphagia, unspecified: Secondary | ICD-10-CM | POA: Insufficient documentation

## 2024-02-26 DIAGNOSIS — K219 Gastro-esophageal reflux disease without esophagitis: Secondary | ICD-10-CM | POA: Diagnosis not present

## 2024-02-27 NOTE — Patient Instructions (Signed)
 SURGICAL WAITING ROOM VISITATION  Patients having surgery or a procedure may have no more than 2 support people in the waiting area - these visitors may rotate.    Children under the age of 64 must have an adult with them who is not the patient.  Visitors with respiratory illnesses are discouraged from visiting and should remain at home.  If the patient needs to stay at the hospital during part of their recovery, the visitor guidelines for inpatient rooms apply. Pre-op nurse will coordinate an appropriate time for 1 support person to accompany patient in pre-op.  This support person may not rotate.    Please refer to the [facility] website for the visitor guidelines for Inpatients (after your surgery is over and you are in a regular room).       Your procedure is scheduled on: 03/11/24   Report to [facility] Main Entrance    Report to admitting at: 5:15 AM   Call this number if you have problems the morning of surgery [phone]   Do not eat food :After: 6:00 PM the day before surgery.   After 6:00 PM you may have the following liquids until : 4:30 AM/ DAY OF SURGERY  Water Non-Citrus Juices (without pulp, NO RED-Apple, White grape, White cranberry) Black Coffee (NO MILK/CREAM OR CREAMERS, sugar ok)  Clear Tea (NO MILK/CREAM OR CREAMERS, sugar ok) regular and decaf                             Plain Jell-O (NO RED)                                           Fruit ices (not with fruit pulp, NO RED)                                     Popsicles (NO RED)                                                               Sports drinks like Gatorade (NO RED)   The day of surgery:  Drink ONE (1) Pre-Surgery Clear G2 at : 4:30 AM the morning of surgery. Drink in one sitting. Do not sip.  This drink was given to you during your hospital  pre-op appointment visit. Nothing else to drink after completing the  Pre-Surgery Clear Ensure or G2.          If you have questions, please  contact your surgeon's office.  MORNING OF SURGERY DRINK:   DRINK 1 G2 drink BEFORE YOU LEAVE HOME, DRINK ALL OF THE  G2 DRINK AT ONE TIME.   NO SOLID FOOD AFTER 600 PM THE NIGHT BEFORE YOUR SURGERY. YOU MAY DRINK CLEAR FLUIDS. THE G2 DRINK YOU DRINK BEFORE YOU LEAVE HOME WILL BE THE LAST FLUIDS YOU DRINK BEFORE SURGERY.  PAIN IS EXPECTED AFTER SURGERY AND WILL NOT BE COMPLETELY ELIMINATED. AMBULATION AND TYLENOL  WILL HELP REDUCE INCISIONAL AND GAS PAIN. MOVEMENT IS KEY!  YOU ARE EXPECTED TO BE OUT OF BED WITHIN  4 HOURS OF ADMISSION TO YOUR PATIENT ROOM.  SITTING IN THE RECLINER THROUGHOUT THE DAY IS IMPORTANT FOR DRINKING FLUIDS AND MOVING GAS THROUGHOUT THE GI TRACT.  COMPRESSION STOCKINGS SHOULD BE WORN [facility] STAY UNLESS YOU ARE WALKING.   INCENTIVE SPIROMETER SHOULD BE USED EVERY HOUR WHILE AWAKE TO DECREASE POST-OPERATIVE COMPLICATIONS SUCH AS PNEUMONIA.  WHEN DISCHARGED HOME, IT IS IMPORTANT TO CONTINUE TO WALK EVERY HOUR AND USE THE INCENTIVE SPIROMETER EVERY HOUR.   FOLLOW ANY ADDITIONAL PRE OP INSTRUCTIONS YOU RECEIVED FROM YOUR SURGEON'S OFFICE!!!   Oral Hygiene is also important to reduce your risk of infection.                                    Remember - BRUSH YOUR TEETH THE MORNING OF SURGERY WITH YOUR REGULAR TOOTHPASTE  DENTURES WILL BE REMOVED PRIOR TO SURGERY PLEASE DO NOT APPLY Poly grip OR ADHESIVES!!!   Do NOT smoke after Midnight   Stop all vitamins and herbal supplements 7 days before surgery.   Take these medicines the morning of surgery with A SIP OF WATER:alprazolam ,venlafaxine ,metoprolol .Use inhalers as usual and bring them.   How to Manage Your Diabetes Before and After Surgery  Why is it important to control my blood sugar before and after surgery? Improving blood sugar levels before and after surgery helps healing and can limit problems. A way of improving blood sugar control is eating a healthy diet by:  Eating less sugar and  carbohydrates  Increasing activity/exercise  Talking with your doctor about reaching your blood sugar goals High blood sugars (greater than 180 mg/dL) can raise your risk of infections and slow your recovery, so you will need to focus on controlling your diabetes during the weeks before surgery. Make sure that the doctor who takes care of your diabetes knows about your planned surgery including the date and location.  How do I manage my blood sugar before surgery? Check your blood sugar at least 4 times a day, starting 2 days before surgery, to make sure that the level is not too high or low. Check your blood sugar the morning of your surgery when you wake up and every 2 hours until you get to the Short Stay unit. If your blood sugar is less than 70 mg/dL, you will need to treat for low blood sugar: Do not take insulin . Treat a low blood sugar (less than 70 mg/dL) with  cup of clear juice (cranberry or apple), 4 glucose tablets, OR glucose gel. Recheck blood sugar in 15 minutes after treatment (to make sure it is greater than 70 mg/dL). If your blood sugar is not greater than 70 mg/dL on recheck, call [phone] for further instructions. Report your blood sugar to the short stay nurse when you get to Short Stay.  If you are admitted to the hospital after surgery: Your blood sugar will be checked by the staff and you will probably be given insulin  after surgery (instead of oral diabetes medicines) to make sure you have good blood sugar levels. The goal for blood sugar control after surgery is 80-180 mg/dL.   WHAT DO I DO ABOUT MY DIABETES MEDICATION?  Do not take oral diabetes medicines (pills) the morning of surgery.  THE NIGHT BEFORE SURGERY, take ONLY half of the dose: 35 units of lantus  insulin .      THE MORNING OF SURGERY, .If your CBG is greater than 220  mg/dL, you may take  of your sliding scale  (correction) dose of insulin .: Humalog  ( 3 units)  DO NOT TAKE THE FOLLOWING 7  DAYS PRIOR TO SURGERY: Ozempic , Wegovy, Rybelsus  (Semaglutide ), Byetta  (exenatide ), Bydureon  (exenatide  ER), Victoza , Saxenda  (liraglutide ), or Trulicity  (dulaglutide ) Mounjaro  (Tirzepatide ) Adlyxin (Lixisenatide), Polyethylene Glycol Loxenatide.    DO NOT TAKE ANY ORAL DIABETIC MEDICATIONS DAY OF YOUR SURGERY  Bring CPAP mask and tubing day of surgery.                              You may not have any metal on your body including hair pins, jewelry, and body piercing             Do not wear make-up, lotions, powders, perfumes/cologne, or deodorant  Do not wear nail polish including gel and S&S, artificial/acrylic nails, or any other type of covering on natural nails including finger and toenails. If you have artificial nails, gel coating, etc. that needs to be removed by a nail salon please have this removed prior to surgery or surgery may need to be canceled/ delayed if the surgeon/ anesthesia feels like they are unable to be safely monitored.   Do not shave  48 hours prior to surgery.    Do not bring valuables to the hospital. [facility] IS NOT             RESPONSIBLE   FOR VALUABLES.   Contacts, glasses, dentures or bridgework may not be worn into surgery.   Bring small overnight bag day of surgery.   DO NOT BRING YOUR HOME MEDICATIONS TO THE HOSPITAL. PHARMACY WILL DISPENSE MEDICATIONS LISTED ON YOUR MEDICATION LIST TO YOU DURING YOUR ADMISSION IN THE HOSPITAL!    Patients discharged on the day of surgery will not be allowed to drive home.  Someone NEEDS to stay with you for the first 24 hours after anesthesia.   Special Instructions: Bring a copy of your healthcare power of attorney and living will documents the day of surgery if you haven't scanned them before.              Please read over the following fact sheets you were given: IF YOU HAVE QUESTIONS ABOUT YOUR PRE-OP INSTRUCTIONS PLEASE CALL [phone].   If you received a COVID test during your pre-op visit  it is requested  that you wear a mask when out in public, stay away from anyone that may not be feeling well and notify your surgeon if you develop symptoms. If you test positive for Covid or have been in contact with anyone that has tested positive in the last 10 days please notify you surgeon.     WHAT IS A BLOOD TRANSFUSION? Blood Transfusion Information  A transfusion is the replacement of blood or some of its parts. Blood is made up of multiple cells which provide different functions. Red blood cells carry oxygen  and are used for blood loss replacement. White blood cells fight against infection. Platelets control bleeding. Plasma helps clot blood. Other blood products are available for specialized needs, such as hemophilia or other clotting disorders. BEFORE THE TRANSFUSION  Who gives blood for transfusions?  Healthy volunteers who are fully evaluated to make sure their blood is safe. This is blood bank blood. Transfusion therapy is the safest it has ever been in the practice of medicine. Before blood is taken from a donor, a complete history is taken to make sure that person  has no history of diseases nor engages in risky social behavior (examples are intravenous drug use or sexual activity with multiple partners). The donor's travel history is screened to minimize risk of transmitting infections, such as malaria. The donated blood is tested for signs of infectious diseases, such as HIV and hepatitis. The blood is then tested to be sure it is compatible with you in order to minimize the chance of a transfusion reaction. If you or a relative donates blood, this is often done in anticipation of surgery and is not appropriate for emergency situations. It takes many days to process the donated blood. RISKS AND COMPLICATIONS Although transfusion therapy is very safe and saves many lives, the main dangers of transfusion include:  Getting an infectious disease. Developing a transfusion reaction. This is an allergic  reaction to something in the blood you were given. Every precaution is taken to prevent this. The decision to have a blood transfusion has been considered carefully by your caregiver before blood is given. Blood is not given unless the benefits outweigh the risks. AFTER THE TRANSFUSION Right after receiving a blood transfusion, you will usually feel much better and more energetic. This is especially true if your red blood cells have gotten low (anemic). The transfusion raises the level of the red blood cells which carry oxygen , and this usually causes an energy increase. The nurse administering the transfusion will monitor you carefully for complications. HOME CARE INSTRUCTIONS  No special instructions are needed after a transfusion. You may find your energy is better. Speak with your caregiver about any limitations on activity for underlying diseases you may have. SEEK MEDICAL CARE IF:  Your condition is not improving after your transfusion. You develop redness or irritation at the intravenous (IV) site. SEEK IMMEDIATE MEDICAL CARE IF:  Any of the following symptoms occur over the next 12 hours: Shaking chills. You have a temperature by mouth above 102 F (38.9 C), not controlled by medicine. Chest, back, or muscle pain. People around you feel you are not acting correctly or are confused. Shortness of breath or difficulty breathing. Dizziness and fainting. You get a rash or develop hives. You have a decrease in urine output. Your urine turns a dark color or changes to pink, red, or brown. Any of the following symptoms occur over the next 10 days: You have a temperature by mouth above 102 F (38.9 C), not controlled by medicine. Shortness of breath. Weakness after normal activity. The white part of the eye turns yellow (jaundice). You have a decrease in the amount of urine or are urinating less often. Your urine turns a dark color or changes to pink, red, or brown. Document Released:  04/07/2000 Document Revised: 07/03/2011 Document Reviewed: 11/25/2007 ExitCare Patient Information 2014 Fid[employer] BIJOU.  _______________________________________________________________________[facility] - Preparing for Surgery Before surgery, you can play an important role.  Because skin is not sterile, your skin needs to be as free of germs as possible.  You can reduce the number of germs on your skin by washing with CHG (chlorahexidine gluconate) soap before surgery.  CHG is an antiseptic cleaner which kills germs and bonds with the skin to continue killing germs even after washing. Please DO NOT use if you have an allergy to CHG or antibacterial soaps.  If your skin becomes reddened/irritated stop using the CHG and inform your nurse when you arrive at Short Stay. Do not shave (including legs and underarms) for at least 48 hours prior to the first CHG shower.  You may shave your face/neck.  Please follow these instructions carefully:  1.  Shower with CHG Soap the night before surgery ONLY (DO NOT USE THE SOAP THE MORNING OF SURGERY).  2.  If you choose to wash your hair, wash your hair first as usual with your normal  shampoo.  3.  After you shampoo, rinse your hair and body thoroughly to remove the shampoo.                             4.  Use CHG as you would any other liquid soap.  You can apply chg directly to the skin and wash.  Gently with a scrungie or clean washcloth.  5.  Apply the CHG Soap to your body ONLY FROM THE NECK DOWN.   Do   not use on face/ open                           Wound or open sores. Avoid contact with eyes, ears mouth and   genitals (private parts).                       Wash face,  Genitals (private parts) with your normal soap.             6.  Wash thoroughly, paying special attention to the area where your    surgery  will be performed.  7.  Thoroughly rinse your body with warm water from the neck down.  8.  DO NOT shower/wash with your normal soap after using and  rinsing off the CHG Soap.                9.  Pat yourself dry with a clean towel.            10.  Wear clean pajamas.            11.  Place clean sheets on your bed the night of your first shower and do not  sleep with pets. Day of Surgery : Do not apply any CHG, lotions/deodorants the morning of surgery.  Please wear clean clothes to the hospital/surgery center.  FAILURE TO FOLLOW THESE INSTRUCTIONS MAY RESULT IN THE CANCELLATION OF YOUR SURGERY  PATIENT SIGNATURE_________________________________  NURSE SIGNATURE__________________________________  ________________________________________________________________________

## 2024-02-28 DIAGNOSIS — Z9889 Other specified postprocedural states: Secondary | ICD-10-CM | POA: Diagnosis not present

## 2024-02-28 DIAGNOSIS — E236 Other disorders of pituitary gland: Secondary | ICD-10-CM | POA: Diagnosis not present

## 2024-02-28 DIAGNOSIS — Z87898 Personal history of other specified conditions: Secondary | ICD-10-CM | POA: Diagnosis not present

## 2024-02-28 DIAGNOSIS — Z794 Long term (current) use of insulin: Secondary | ICD-10-CM | POA: Diagnosis not present

## 2024-02-28 DIAGNOSIS — E1165 Type 2 diabetes mellitus with hyperglycemia: Secondary | ICD-10-CM | POA: Diagnosis not present

## 2024-02-28 DIAGNOSIS — Z9009 Acquired absence of other part of head and neck: Secondary | ICD-10-CM | POA: Diagnosis not present

## 2024-02-28 DIAGNOSIS — E041 Nontoxic single thyroid nodule: Secondary | ICD-10-CM | POA: Diagnosis not present

## 2024-02-29 ENCOUNTER — Other Ambulatory Visit: Payer: Self-pay

## 2024-02-29 ENCOUNTER — Encounter (HOSPITAL_COMMUNITY): Payer: Self-pay

## 2024-02-29 ENCOUNTER — Encounter (HOSPITAL_COMMUNITY)
Admission: RE | Admit: 2024-02-29 | Discharge: 2024-02-29 | Disposition: A | Discharge status: Home / Self Care | Source: Ambulatory Visit | Attending: General Surgery | Admitting: General Surgery

## 2024-02-29 VITALS — BP 163/82 | HR 77 | Temp 98.7°F | Ht 71.5 in | Wt 260.0 lb

## 2024-02-29 DIAGNOSIS — Z01812 Encounter for preprocedural laboratory examination: Secondary | ICD-10-CM | POA: Diagnosis not present

## 2024-02-29 DIAGNOSIS — Z01818 Encounter for other preprocedural examination: Secondary | ICD-10-CM

## 2024-02-29 DIAGNOSIS — Z794 Long term (current) use of insulin: Secondary | ICD-10-CM | POA: Diagnosis not present

## 2024-02-29 DIAGNOSIS — E119 Type 2 diabetes mellitus without complications: Secondary | ICD-10-CM | POA: Insufficient documentation

## 2024-02-29 DIAGNOSIS — E66812 Obesity, class 2: Secondary | ICD-10-CM | POA: Diagnosis not present

## 2024-02-29 LAB — COMPREHENSIVE METABOLIC PANEL WITH GFR
ALT: 34 U/L (ref 0–44)
AST: 34 U/L (ref 15–41)
Albumin: 4.3 g/dL (ref 3.5–5.0)
Alkaline Phosphatase: 114 U/L (ref 38–126)
Anion gap: 8 (ref 5–15)
BUN: 17 mg/dL (ref 8–23)
CO2: 29 mmol/L (ref 22–32)
Calcium: 9.3 mg/dL (ref 8.9–10.3)
Chloride: 104 mmol/L (ref 98–111)
Creatinine, Ser: 0.86 mg/dL (ref 0.44–1.00)
GFR, Estimated: >60 mL/min (ref >60)
Glucose, Bld: 128 mg/dL — ABNORMAL HIGH (ref 70–99)
Potassium: 4.2 mmol/L (ref 3.5–5.1)
Sodium: 141 mmol/L (ref 135–145)
Total Bilirubin: 0.3 mg/dL (ref 0.0–1.2)
Total Protein: 8 g/dL (ref 6.5–8.1)

## 2024-02-29 LAB — CBC WITH DIFFERENTIAL/PLATELET
Abs Immature Granulocytes: 0.01 K/uL (ref 0.00–0.07)
Basophils Absolute: 0 K/uL (ref 0.0–0.1)
Basophils Relative: 1 %
Eosinophils Absolute: 0.4 K/uL (ref 0.0–0.5)
Eosinophils Relative: 6 %
HCT: 45.1 % (ref 36.0–46.0)
Hemoglobin: 14 g/dL (ref 12.0–15.0)
Immature Granulocytes: 0 %
Lymphocytes Relative: 36 %
Lymphs Abs: 2.3 K/uL (ref 0.7–4.0)
MCH: 28.3 pg (ref 26.0–34.0)
MCHC: 31 g/dL (ref 30.0–36.0)
MCV: 91.1 fL (ref 80.0–100.0)
Monocytes Absolute: 0.5 K/uL (ref 0.1–1.0)
Monocytes Relative: 8 %
Neutro Abs: 3.1 K/uL (ref 1.7–7.7)
Neutrophils Relative %: 49 %
Platelets: 283 K/uL (ref 150–400)
RBC: 4.95 MIL/uL (ref 3.87–5.11)
RDW: 13.7 % (ref 11.5–15.5)
WBC: 6.2 K/uL (ref 4.0–10.5)
nRBC: 0 % (ref 0.0–0.2)

## 2024-02-29 LAB — HEMOGLOBIN A1C
Hgb A1c MFr Bld: 8.7 % — ABNORMAL HIGH (ref 4.8–5.6)
Mean Plasma Glucose: 202.99 mg/dL

## 2024-02-29 LAB — TYPE AND SCREEN
ABO/RH(D): B POS
Antibody Screen: NEGATIVE

## 2024-02-29 LAB — GLUCOSE, CAPILLARY: Glucose-Capillary: 132 mg/dL — ABNORMAL HIGH (ref 70–99)

## 2024-02-29 NOTE — Progress Notes (Signed)
Lab. Results: A1C: 8.7

## 2024-02-29 NOTE — Progress Notes (Addendum)
 For Anesthesia: PCP - Okey Carlin Redbird, MD  Cardiologist - Wonda Sharper, MD  Clearance: Vannie Reche RAMAN, NP : 11/22/23 Bowel Prep reminder:  Chest x-ray - 03/15/23 EKG - 03/15/23 Stress Test -  ECHO - 08/17/21 Cardiac Cath - 05/13/20 Pacemaker/ICD device last checked: Pacemaker orders received: Device Rep notified:  Spinal Cord Stimulator:N/A  Sleep Study - Yes CPAP - NO  Fasting Blood Sugar - 140's Checks Blood Sugar : continuous freestyle monitor Date and result of last Hgb A1c-9.2: 03/02/23  Last dose of GLP1 agonist- N/A GLP1 instructions: Hold 7 days prior to schedule (Hold 24 hours-daily)   Last dose of SGLT-2 inhibitors- N/A SGLT-2 instructions: Hold 72 hours prior to surgery  Blood Thinner Instructions:N/A Last Dose: Time last taken:  Aspirin  Instructions:N/A Last Dose: Time last taken:  Activity level: Can go up a flight of stairs and activities of daily living without stopping and without chest pain and/or shortness of breath   Able to exercise without chest pain and/or shortness of breath  Anesthesia review: Hx: HTN,CHF,DIA,OSA(NO CPAP),Stroke. A1C: : 8.7  Patient denies shortness of breath, fever, cough and chest pain at PAT appointment   Patient verbalized understanding of instructions that were reviewed over the telephone.

## 2024-03-02 ENCOUNTER — Encounter (HOSPITAL_BASED_OUTPATIENT_CLINIC_OR_DEPARTMENT_OTHER): Payer: Self-pay

## 2024-03-02 ENCOUNTER — Emergency Department (HOSPITAL_COMMUNITY)
Admission: EM | Admit: 2024-03-02 | Discharge: 2024-03-02 | Disposition: A | Discharge status: Home / Self Care | Attending: Emergency Medicine | Admitting: Emergency Medicine

## 2024-03-02 ENCOUNTER — Emergency Department (HOSPITAL_COMMUNITY)

## 2024-03-02 ENCOUNTER — Other Ambulatory Visit: Payer: Self-pay

## 2024-03-02 DIAGNOSIS — Z794 Long term (current) use of insulin: Secondary | ICD-10-CM | POA: Insufficient documentation

## 2024-03-02 DIAGNOSIS — Z853 Personal history of malignant neoplasm of breast: Secondary | ICD-10-CM | POA: Diagnosis not present

## 2024-03-02 DIAGNOSIS — Z8616 Personal history of COVID-19: Secondary | ICD-10-CM | POA: Insufficient documentation

## 2024-03-02 DIAGNOSIS — Z8673 Personal history of transient ischemic attack (TIA), and cerebral infarction without residual deficits: Secondary | ICD-10-CM | POA: Insufficient documentation

## 2024-03-02 DIAGNOSIS — K5790 Diverticulosis of intestine, part unspecified, without perforation or abscess without bleeding: Secondary | ICD-10-CM | POA: Diagnosis not present

## 2024-03-02 DIAGNOSIS — I2510 Atherosclerotic heart disease of native coronary artery without angina pectoris: Secondary | ICD-10-CM | POA: Diagnosis not present

## 2024-03-02 DIAGNOSIS — R079 Chest pain, unspecified: Secondary | ICD-10-CM | POA: Diagnosis not present

## 2024-03-02 DIAGNOSIS — R072 Precordial pain: Secondary | ICD-10-CM | POA: Diagnosis not present

## 2024-03-02 DIAGNOSIS — I110 Hypertensive heart disease with heart failure: Secondary | ICD-10-CM | POA: Insufficient documentation

## 2024-03-02 DIAGNOSIS — I5022 Chronic systolic (congestive) heart failure: Secondary | ICD-10-CM | POA: Insufficient documentation

## 2024-03-02 DIAGNOSIS — E119 Type 2 diabetes mellitus without complications: Secondary | ICD-10-CM | POA: Insufficient documentation

## 2024-03-02 DIAGNOSIS — R202 Paresthesia of skin: Secondary | ICD-10-CM | POA: Insufficient documentation

## 2024-03-02 LAB — CBG MONITORING, ED
Glucose-Capillary: 106 mg/dL — ABNORMAL HIGH (ref 70–99)
Glucose-Capillary: 59 mg/dL — ABNORMAL LOW (ref 70–99)

## 2024-03-02 LAB — CBC
HCT: 44.1 % (ref 36.0–46.0)
Hemoglobin: 14.1 g/dL (ref 12.0–15.0)
MCH: 28.5 pg (ref 26.0–34.0)
MCHC: 32 g/dL (ref 30.0–36.0)
MCV: 89.3 fL (ref 80.0–100.0)
Platelets: 278 K/uL (ref 150–400)
RBC: 4.94 MIL/uL (ref 3.87–5.11)
RDW: 13.8 % (ref 11.5–15.5)
WBC: 6.7 K/uL (ref 4.0–10.5)
nRBC: 0 % (ref 0.0–0.2)

## 2024-03-02 LAB — BASIC METABOLIC PANEL WITH GFR
Anion gap: 13 (ref 5–15)
BUN: 15 mg/dL (ref 8–23)
CO2: 20 mmol/L — ABNORMAL LOW (ref 22–32)
Calcium: 8.6 mg/dL — ABNORMAL LOW (ref 8.9–10.3)
Chloride: 107 mmol/L (ref 98–111)
Creatinine, Ser: 0.73 mg/dL (ref 0.44–1.00)
GFR, Estimated: >60 mL/min (ref >60)
Glucose, Bld: 76 mg/dL (ref 70–99)
Potassium: 3.9 mmol/L (ref 3.5–5.1)
Sodium: 140 mmol/L (ref 135–145)

## 2024-03-02 LAB — TROPONIN I (HIGH SENSITIVITY)
Troponin I (High Sensitivity): 6 ng/L (ref <18)
Troponin I (High Sensitivity): 4 ng/L (ref <18)

## 2024-03-02 NOTE — Discharge Instructions (Signed)
 You were seen in the emergency department today for chest pain.  Given that your pain has resolved and has not returned and you feel well and ready to go home, no further evaluation is indicated at this time  As we discussed your lab work, EKG, chest x-ray all looked reassuring today.    Given your risk factors, I do strongly recommend that you reach out to your cardiologist to schedule a close follow-up appointment.  If you have any return of symptoms and I recommend that you come back in.  I recommend monitoring your stress levels.  Continue to monitor how you are doing overall, and return to the emergency department for any new or worsening symptoms such as: Worsening pain or pain with exertion, difficulty breathing, sweating, or pain or swelling in your legs.

## 2024-03-02 NOTE — ED Triage Notes (Signed)
 Patient arrives via [facility] for chest pain from church since 0930. Pain was sudden stabbing center of chest, rated 9/10 no relief with positioning. Tingling down right hand that is intermittent in nature. Hx of MI. Pain is similar in nature. 324 ASA given en route, 2 nitroglycerin  170/90 BP after. GCS 15. CBG 132. HR 80 O2 98%. T wave inversion, hyper acute t waves. 200/100 initial BP.

## 2024-03-02 NOTE — ED Provider Notes (Signed)
 [place] [facility] AT [facility] Provider Note   CSN: [id number] Arrival date & time: 03/02/24  1138     Patient presents with: Chest Pain   Destiny Beasley is a 68 y.o. female.   Patient with history of CAD, CHF, diabetes, STEMI in 2019 s/p stent placement in Lcx, hypertension, CVA presents today with complaints of chest pain. Reports that pain began at 930 this morning when she was on her way to church. Reports pain is in the middle of her chest, reports she did have some tingling in her right arm as well. Reports that EMS gave her nitroglycerin  and aspirin  and her pain went away and has not returned. She denies any shortness of breath, fevers, chills, cough, or congestion. Reports she initially was sweaty and nauseous, however at this time all her symptoms have resolved.  Denies any leg pain or leg swelling, no recent travel or surgeries.  The history is provided by the patient. No language interpreter was used.  Chest Pain      Prior to Admission medications   Medication Sig Start Date End Date Taking? Authorizing Provider  acetaminophen  (TYLENOL ) 500 MG tablet Take 1,000 mg by mouth every 6 (six) hours as needed for moderate pain (pain score 4-6).   Yes [provider]  ADVAIR  HFA 115-21 MCG/ACT inhaler Inhale 2 puffs into the lungs in the morning and at bedtime. 12/14/20  Yes [provider]  albuterol  (VENTOLIN  HFA) 108 (90 Base) MCG/ACT inhaler Inhale 1-2 puffs into the lungs every 6 (six) hours as needed for wheezing or shortness of breath. 12/04/23  Yes [provider]  ALPRAZolam  (XANAX ) 0.25 MG tablet Take 0.25 mg by mouth 2 (two) times daily as needed for anxiety. 12/09/20  Yes [provider]  diclofenac Sodium (VOLTAREN) 1 % GEL Apply 4 g topically 4 (four) times daily.   Yes [provider]  furosemide  (LASIX ) 40 MG tablet Take 1 tablet (40 mg total) by mouth daily. 11/22/23  Yes Vannie Reche RAMAN, NP   HUMALOG  KWIKPEN 100 UNIT/ML KwikPen Inject 6 Units into the skin with breakfast, with lunch, and with evening meal.   Yes [provider]  LANTUS  SOLOSTAR 100 UNIT/ML Solostar Pen INJECT SUBCUTANEOUSLY 70  UNITS DAILY 10/11/21  Yes Von Pacific, MD  metoprolol  succinate (TOPROL -XL) 25 MG 24 hr tablet Take 1 tablet (25 mg total) by mouth daily. Take with or immediately following a meal. 06/28/23  Yes Vannie Reche RAMAN, NP  nitroGLYCERIN  (NITROSTAT ) 0.4 MG SL tablet Place 1 tablet (0.4 mg total) under the tongue every 5 (five) minutes as needed for chest pain. 06/28/23  Yes Vannie Reche RAMAN, NP  PRALUENT  75 MG/ML SOAJ ADMINISTER 1 ML (75 MG) UNDER THE SKIN EVERY 14 DAYS 12/25/23  Yes Walker, Caitlin S, NP  sacubitril -valsartan  (ENTRESTO ) 49-51 MG TAKE 1 TABLET BY MOUTH TWICE DAILY 02/14/24  Yes Walker, Caitlin S, NP  valACYclovir  (VALTREX ) 1000 MG tablet Take 1,000 mg by mouth 2 (two) times daily as needed (fever blisters). 05/02/20  Yes [provider]  venlafaxine  XR (EFFEXOR -XR) 75 MG 24 hr capsule Take 75 mg by mouth daily with breakfast.   Yes [provider]  Vitamin D, Ergocalciferol, (DRISDOL) 1.25 MG (50000 UNIT) CAPS capsule Take 50,000 Units by mouth every Wednesday. 01/16/24  Yes [provider]  Continuous Blood Gluc Sensor (FREESTYLE LIBRE 3 SENSOR) MISC APPLY 1 SENSOR TO UPPER ARM  EVERY 14 DAYS FOR CONTINUOUS  GLUCOSE MONITORING 10/05/21  Von Pacific, MD  glucose blood (FREESTYLE TEST STRIPS) test strip Use as instructed 06/10/20   Von Pacific, MD    Allergies: Hydrocodone , Invokana  Jorje ], Cardizem [diltiazem hcl], Cardura  [doxazosin ], Chlorthalidone , Coreg  [carvedilol ], Crestor  [rosuvastatin ], Hydralazine , Imdur  [isosorbide  nitrate], Metformin  hcl, Norvasc  [amlodipine ], Repatha  [evolocumab ], Semaglutide , Spironolactone , Tirzepatide , Valsartan , Zocor [simvastatin], Atorvastatin , and Compazine  [prochlorperazine ]    Review of Systems  Cardiovascular:   Positive for chest pain.  All other systems reviewed and are negative.   Updated Vital Signs BP (!) 175/89   Pulse 72   Temp 98.3 F (36.8 C) (Oral)   Resp 15   Ht 5' 11.5 (1.816 m)   Wt 117.9 kg   SpO2 100%   BMI 35.76 kg/m   Physical Exam Vitals and nursing note reviewed.  Constitutional:      General: She is not in acute distress.    Appearance: Normal appearance. She is normal weight. She is not ill-appearing, toxic-appearing or diaphoretic.  HENT:     Head: Normocephalic and atraumatic.  Cardiovascular:     Rate and Rhythm: Normal rate and regular rhythm.     Pulses:          Dorsalis pedis pulses are 2+ on the right side and 2+ on the left side.       Posterior tibial pulses are 2+ on the right side and 2+ on the left side.     Heart sounds: Normal heart sounds.  Pulmonary:     Effort: Pulmonary effort is normal. No respiratory distress.     Breath sounds: Normal breath sounds.  Chest:     Chest wall: No tenderness.  Abdominal:     Palpations: Abdomen is soft.     Tenderness: There is no abdominal tenderness.  Musculoskeletal:        General: Normal range of motion.     Cervical back: Normal range of motion.     Right lower leg: No tenderness. No edema.     Left lower leg: No tenderness. No edema.  Skin:    General: Skin is warm and dry.  Neurological:     General: No focal deficit present.     Mental Status: She is alert.  Psychiatric:        Mood and Affect: Mood normal.        Behavior: Behavior normal.     (all labs ordered are listed, but only abnormal results are displayed) Labs Reviewed  BASIC METABOLIC PANEL WITH GFR - Abnormal; Notable for the following components:      Result Value   CO2 20 (*)    Calcium  8.6 (*)    All other components within normal limits  CBG MONITORING, ED - Abnormal; Notable for the following components:   Glucose-Capillary 59 (*)    All other components within normal limits  CBG MONITORING, ED - Abnormal; Notable  for the following components:   Glucose-Capillary 106 (*)    All other components within normal limits  CBC  TROPONIN I (HIGH SENSITIVITY)  TROPONIN I (HIGH SENSITIVITY)    EKG: EKG Interpretation Date/Time:  Sunday March 02 2024 11:56:18 EST Ventricular Rate:  72 PR Interval:  190 QRS Duration:  90 QT Interval:  399 QTC Calculation: 437 R Axis:   62  Text Interpretation: Sinus rhythm Probable inferior infarct, age indeterminate since last tracing no significant change Confirmed by Lenor Hollering [id number]) on 03/02/2024 1:31:40 PM  Radiology: DG Chest 2 View Result Date: 03/02/2024 EXAM: 2 VIEW(S) XRAY OF THE CHEST  03/02/2024 01:11:19 PM COMPARISON: 03/15/2023 CLINICAL HISTORY: chest pain FINDINGS: LUNGS AND PLEURA: No focal pulmonary opacity. No pulmonary edema. No pleural effusion. No pneumothorax. HEART AND MEDIASTINUM: No acute abnormality of the cardiac and mediastinal silhouettes. BONES AND SOFT TISSUES: No acute osseous abnormality. EXTRATHORACIC FINDINGS: Retained oral contrast in diverticula at splenic flexure. IMPRESSION: 1. No acute cardiopulmonary process. Electronically signed by: Donnice Mania MD 03/02/2024 01:39 PM EST RP Workstation: [id number]     Procedures   Medications Ordered in the ED - No data to display                                  Medical Decision Making Amount and/or Complexity of Data Reviewed Labs: ordered. Radiology: ordered.   This patient is a 68 y.o. female who presents to the ED for concern of chest pain, this involves an extensive number of treatment options, and is a complaint that carries with it a high risk of complications and morbidity. The emergent differential diagnosis prior to evaluation includes, but is not limited to,  ACS, pericarditis, myocarditis, aortic dissection, PE, pneumothorax, esophageal rupture, pneumonia, reflux/PUD, biliary disease, pancreatitis, costochondritis, anxiety  This is not an exhaustive differential.    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  Additional history: Chart reviewed. Pertinent results include: follows with cardiology Dr. Wonda and Dr. Raford. Hx STEMI in 2019, had DES in LCx  Physical Exam: Physical exam performed. The pertinent findings include: well appearing, no acute physical exam abnormalities  Lab Tests: I ordered, and personally interpreted labs.  The pertinent results include:  no acute laboratory abnormalities   Imaging Studies: I ordered imaging studies including CXR. I independently visualized and interpreted imaging which showed NAD. I agree with the radiologist interpretation.   Cardiac Monitoring:  The patient was maintained on a cardiac monitor.  My attending physician Dr. Lenor viewed and interpreted the cardiac monitored which showed an underlying rhythm of: sinus rhythm, no STEMI. I agree with this interpretation.   Medications: EMS gave aspirin  and nitroglycerin  prior to my evaluation.  Since I have seen her she has had no symptoms and therefore no medications were indicated  Disposition: After consideration of the diagnostic results and the patients response to treatment, I feel that emergency department workup does not suggest an emergent condition requiring admission or immediate intervention beyond what has been performed at this time. The plan  is: Discharge with close outpatient follow-up and return precautions.  I did consider cardiac consultation/admission, however patient continues to be asymptomatic throughout her ER stay today which has been greater than 5 hours.  Her workup is benign.  She really wants to go home.  She plans to reach out to her cardiologist tomorrow to discuss follow-up which is reasonable.  Symptoms do seem atypical in nature for cardiac etiology.  She has no shortness of breath or other signs or symptoms to suggest AAA/PE.  She was able to get up and walk to the bathroom several times  without any return of pain. Evaluation and diagnostic testing in the emergency department does not suggest an emergent condition requiring admission or immediate intervention beyond what has been performed at this time.  Plan for discharge with close PCP follow-up.  Patient is understanding and amenable with plan, educated on red flag symptoms that would prompt immediate return.  Patient discharged in stable condition.   I discussed this case with my attending physician Dr. Lenor who cosigned this note including patient's presenting symptoms, physical exam, and planned diagnostics and interventions. Attending physician stated agreement with plan or made changes to plan which were implemented.    Final diagnoses:  Precordial chest pain    ED Discharge Orders     None     An After Visit Summary was printed and given to the patient.      Nora Lauraine LABOR, PA-C 03/02/24 1656    Lenor Hollering, MD 03/02/24 [phone]

## 2024-03-02 NOTE — ED Notes (Signed)
 Called to check status of BMP. Lab states that the specimen does not need to be recollected and it should result soon. Primary RN notified.

## 2024-03-03 ENCOUNTER — Encounter (HOSPITAL_BASED_OUTPATIENT_CLINIC_OR_DEPARTMENT_OTHER): Payer: Self-pay

## 2024-03-03 MED ORDER — NIFEDIPINE ER OSMOTIC RELEASE 30 MG PO TB24: 30.0000 mg | ORAL_TABLET | Freq: Every evening | ORAL | 5 refills | Status: DC

## 2024-03-03 NOTE — Telephone Encounter (Signed)
Addressed via separate encounter.   Alver Sorrow, NP

## 2024-03-03 NOTE — Telephone Encounter (Signed)
 Her surgeon is likely going to require a repeat cardiology visit prior to surgery given her ED visit for chest pain. Recommend follow up with APP this week.   Recommend start Nifedipine  30mg  every evening to help lower BP.   Monitor BP once per day and bring log to her follow up visit.   Dodge Ator S Jayleah Garbers, NP

## 2024-03-06 ENCOUNTER — Encounter (HOSPITAL_BASED_OUTPATIENT_CLINIC_OR_DEPARTMENT_OTHER): Payer: Self-pay | Admitting: Cardiology

## 2024-03-06 ENCOUNTER — Ambulatory Visit (HOSPITAL_BASED_OUTPATIENT_CLINIC_OR_DEPARTMENT_OTHER): Admitting: Cardiology

## 2024-03-06 VITALS — BP 134/76 | HR 82 | Ht 71.5 in | Wt 260.8 lb

## 2024-03-06 DIAGNOSIS — I25118 Atherosclerotic heart disease of native coronary artery with other forms of angina pectoris: Secondary | ICD-10-CM

## 2024-03-06 DIAGNOSIS — E785 Hyperlipidemia, unspecified: Secondary | ICD-10-CM

## 2024-03-06 DIAGNOSIS — Z01810 Encounter for preprocedural cardiovascular examination: Secondary | ICD-10-CM | POA: Diagnosis not present

## 2024-03-06 DIAGNOSIS — I10 Essential (primary) hypertension: Secondary | ICD-10-CM

## 2024-03-06 NOTE — Patient Instructions (Signed)
 Medication Instructions:  No changes *If you need a refill on your cardiac medications before your next appointment, please call your pharmacy*  Lab Work: None  Testing/Procedures: none  Follow-Up: At [facility], you and your health needs are our priority.  As part of our continuing mission to provide you with exceptional heart care, our providers are all part of one team.  This team includes your primary Cardiologist (physician) and Advanced Practice Providers or APPs (Physician Assistants and Nurse Practitioners) who all work together to provide you with the care you need, when you need it.  Your next appointment:   12 month(s)  Provider:   Oneil Parchment, MD, Rosaline Bane, NP, or Reche Finder, NP

## 2024-03-06 NOTE — Progress Notes (Signed)
 Cardiology Office Note:  .   Date:  03/06/2024  ID:  Jerel CHRISTELLA Lowers, DOB [birth year], MRN [id number] PCP: Okey Carlin Redbird, MD  [facility] Providers Cardiologist:  Ozell Fell, MD Cardiology APP:  Lelon Glendia DASEN, PA-C    History of Present Illness: .   Destiny Beasley is a 68 y.o. female Discussed the use of AI scribe  History of Present Illness Destiny Beasley is a 68 year old female with coronary artery disease who presents for pre-operative evaluation before bariatric surgery.  She recently experienced an episode of elevated blood pressure, which has since stabilized to 135 mmHg. She reports that she resumed exercising, specifically walking, and finds it beneficial for her blood pressure.  No chest pain or significant shortness of breath, even during physical activities such as walking or climbing stairs. She feels tired but not out of breath during these activities.  Her cardiac history includes a cardiac catheterization on May 13, 2020, which showed a widely patent stent in the midcircumflex to distal circumflex artery and a 40% stenosis in the proximal LAD. Her ejection fraction was estimated at 45% at that time. A follow-up echocardiogram on August 17, 2021, showed an ejection fraction of 45-50% with trivial mitral regurgitation.  She is currently taking metoprolol . She also uses a cholesterol-lowering injection, which has been effective, with her LDL at 71 mg/dL.       Studies Reviewed: SABRA   EKG Interpretation Date/Time:  Thursday March 06 2024 14:34:57 EST Ventricular Rate:  83 PR Interval:  184 QRS Duration:  84 QT Interval:  358 QTC Calculation: 420 R Axis:   38  Text Interpretation: Normal sinus rhythm Possible Inferior infarct , age undetermined When compared with ECG of 02-Mar-2024 11:56, No significant change since last tracing Confirmed by Jeffrie Anes ([id number]) on 03/06/2024 2:52:49 PM    Results LABS LDL: 71 Creatinine: 0.7 Hemoglobin:  14  DIAGNOSTIC Cardiac catheterization: Midcircumflex to distal circumflex stent widely patent, proximal LAD 40% stenosis, EF 45%, normal LVEDP (05/13/2020) Echocardiography: EF 45-50%, trivial mitral regurgitation (08/17/2021) EKG: Stable Risk Assessment/Calculations:            Physical Exam:   VS:  BP 134/76   Pulse 82   Ht 5' 11.5 (1.816 m)   Wt 260 lb 12.8 oz (118.3 kg)   SpO2 98%   BMI 35.87 kg/m    Wt Readings from Last 3 Encounters:  03/06/24 260 lb 12.8 oz (118.3 kg)  03/02/24 260 lb (117.9 kg)  02/29/24 260 lb (117.9 kg)    GEN: Well nourished, well developed in no acute distress NECK: No JVD; No carotid bruits CARDIAC: RRR, no murmurs, no rubs, no gallops RESPIRATORY:  Clear to auscultation without rales, wheezing or rhonchi  ABDOMEN: Soft, non-tender, non-distended EXTREMITIES:  No edema; No deformity   ASSESSMENT AND PLAN: .    Assessment and Plan Assessment & Plan Atherosclerotic heart disease with prior stent placement and preserved left ventricular function Previously placed mid circumflex to distal circumflex stent is widely patent. Proximal LAD is 40% stenosed. Ejection fraction is 45-50% with trivial mitral regurgitation. No current symptoms of chest pain or dyspnea. Recent cardiac catheterization and echocardiogram show stable condition. No further cardiac testing needed as condition is well-managed. - Proceed with bariatric surgery without further cardiac testing-Low Risk. - Continue current medications except as directed by surgical team.  Essential hypertension Blood pressure was elevated but has improved to 135 mmHg. No current symptoms of chest pain or dyspnea.  Blood pressure management is crucial, especially in the context of upcoming surgery. - Continue current antihypertensive regimen. - Encouraged regular exercise to aid in blood pressure control.  Hyperlipidemia LDL cholesterol is well-controlled at 71 mg/dL with current treatment regimen.  Continues on cholesterol-lowering injection (Produlin) with good effect. - Continue current cholesterol-lowering therapy.         Dispo: 1 yr  Signed, Oneil Parchment, MD

## 2024-03-09 ENCOUNTER — Encounter (HOSPITAL_BASED_OUTPATIENT_CLINIC_OR_DEPARTMENT_OTHER): Payer: Self-pay

## 2024-03-10 NOTE — Anesthesia Preprocedure Evaluation (Signed)
 Anesthesia Evaluation  Patient identified by MRN, date of birth, ID band Patient awake    Reviewed: Allergy & Precautions, H&P , NPO status , Patient's Chart, lab work & pertinent test results  Airway Mallampati: II   Neck ROM: full    Dental   Pulmonary asthma , sleep apnea    breath sounds clear to auscultation       Cardiovascular hypertension, + angina  + CAD and + Past MI   Rhythm:regular Rate:Normal     Neuro/Psych   Anxiety     CVA    GI/Hepatic   Endo/Other  diabetes, Type 2    Renal/GU      Musculoskeletal   Abdominal   Peds  Hematology   Anesthesia Other Findings   Reproductive/Obstetrics                              Anesthesia Physical Anesthesia Plan  ASA: 3  Anesthesia Plan: General   Post-op Pain Management:    Induction: Intravenous  PONV Risk Score and Plan: 3 and Ondansetron , Dexamethasone , Midazolam  and Treatment may vary due to age or medical condition  Airway Management Planned: Oral ETT  Additional Equipment:   Intra-op Plan:   Post-operative Plan: Extubation in OR  Informed Consent: I have reviewed the patients History and Physical, chart, labs and discussed the procedure including the risks, benefits and alternatives for the proposed anesthesia with the patient or authorized representative who has indicated his/her understanding and acceptance.     Dental advisory given  Plan Discussed with: CRNA, Anesthesiologist and Surgeon  Anesthesia Plan Comments: (See PAT note from 11/7 )         Anesthesia Quick Evaluation

## 2024-03-10 NOTE — Progress Notes (Signed)
 Case: [id number] Date/Time: 03/11/24 0715   Procedures:      CREATION, GASTRIC BYPASS, LAPAROSCOPIC, USING ROUX-EN-Y GASTROENTEROSTOMY, WITH HIATAL HERNIA REPAIR     ENDOSCOPY, UPPER GI TRACT   Anesthesia type: General   Pre-op diagnosis: MORBID OBESITY   Location: [facility] ROOM 01 / [facility]   Surgeons: Kinsinger, Herlene Righter, MD       DISCUSSION: Destiny Beasley is a 68 year old female with past medical history of HTN, history of STEMI (2019) and CAD s/p DES x 1 to left Cx (2019), CHF EF 45-50% (by echo in 2023), OSA (uses Bipap), history of TIA, GERD, diabetes (A1c 8.7), anemia, anxiety, history of right breast cancer status post lumpectomy, chemo and radiation (2017), obesity  Patient follows with cardiology due to history of CAD s/p inferolateral STEMI in March 2019 complicated by cardiogenic shock, acute systolic heart failure and complete heart block.  Cardiac catheterization demonstrated an occluded proximal left circumflex which was treated with a drug-eluting stent.  Cath in 2022 showed patient stent. She was seen in office in July 2025 and cleared for bariatric surgery at that time.  However in the interim she had an ED visit on 11/9 for chest pain.  Cardiac workup in the ED was negative and she was discharged home.  She followed up in the cardiology clinic on 11/13 with Dr. Jeffrie. Cleared for surgery:  Previously placed mid circumflex to distal circumflex stent is widely patent. Proximal LAD is 40% stenosed. Ejection fraction is 45-50% with trivial mitral regurgitation. No current symptoms of chest pain or dyspnea. Recent cardiac catheterization and echocardiogram show stable condition. No further cardiac testing needed as condition is well-managed. - Proceed with bariatric surgery without further cardiac testing-Low Risk. - Continue current medications except as directed by surgical team  VS: BP (!) 163/82   Pulse 77   Temp 37.1 C (Oral)   Ht 5' 11.5 (1.816 m)   Wt 117.9 kg   SpO2 100%    BMI 35.76 kg/m   PROVIDERS: Okey Carlin Redbird, MD   LABS: Labs reviewed: Acceptable for surgery. (all labs ordered are listed, but only abnormal results are displayed)  Labs Reviewed  HEMOGLOBIN A1C - Abnormal; Notable for the following components:      Result Value   Hgb A1c MFr Bld 8.7 (*)    All other components within normal limits  COMPREHENSIVE METABOLIC PANEL WITH GFR - Abnormal; Notable for the following components:   Glucose, Bld 128 (*)    All other components within normal limits  GLUCOSE, CAPILLARY - Abnormal; Notable for the following components:   Glucose-Capillary 132 (*)    All other components within normal limits  CBC WITH DIFFERENTIAL/PLATELET  TYPE AND SCREEN    CXR 03/02/24:  FINDINGS:   LUNGS AND PLEURA: No focal pulmonary opacity. No pulmonary edema. No pleural effusion. No pneumothorax.   HEART AND MEDIASTINUM: No acute abnormality of the cardiac and mediastinal silhouettes.   BONES AND SOFT TISSUES: No acute osseous abnormality.   EXTRATHORACIC FINDINGS: Retained oral contrast in diverticula at splenic flexure.   IMPRESSION: 1. No acute cardiopulmonary process.  EKG 03/06/24:  Normal sinus rhythm Possible Inferior infarct , age undetermined When compared with ECG of 02-Mar-2024 11:56, No significant change since last tracing  Echo 08/17/2021:  IMPRESSIONS    1. Hypokinesis of the inferolateral wall with overall mild LV dysfunction.  2. Left ventricular ejection fraction, by estimation, is 45 to 50%. The left ventricle has mildly decreased function. The left ventricle  demonstrates regional wall motion abnormalities (see scoring diagram/findings for description). Left ventricular diastolic parameters are consistent with Grade I diastolic dysfunction (impaired relaxation).  3. Right ventricular systolic function is normal. The right ventricular size is normal.  4. Left atrial size was mildly dilated.  5. The mitral valve is  normal in structure. Trivial mitral valve regurgitation. No evidence of mitral stenosis.  6. The aortic valve is tricuspid. Aortic valve regurgitation is not visualized. Aortic valve sclerosis is present, with no evidence of aortic valve stenosis.  Comparison(s): EF 40%,mild hypokinesis of the left ventricular, mid-apical inferior wall and inferolateral wall, trivial AI.  LHC 05/13/2020:  Previously placed Mid Cx to Dist Cx stent (unknown type) is widely patent. Prox LAD lesion is 40% stenosed. There is mild to moderate left ventricular systolic dysfunction. LV end diastolic pressure is normal. The left ventricular ejection fraction is 45-50% by visual estimate.  Past Medical History:  Diagnosis Date   Anemia    Anxiety    CAD in native artery    a. Inf STEMI 06/2017 complicated by cardiogenic shock/complete heart block s/p emergent cath showing culprit large dominant LCx s/p aspiration thrombectomy and DES, EF 50% by cath, 40-45% by echo.   Chronic systolic heart failure (HCC) 06/25/2017   Echo 11/19: mild LVH, EF 40-45, inf-lat HK, Gr 1 DD, trivial MR   Complete heart block, transient (HCC)    a. 06/2017 in setting of acute inf MI -> resolved after PCI.   COVID-19    Diabetes mellitus    Goiter 2005   History of radiation therapy 06/14/2016 - 07/19/2016   Right Breast 50 Gy 25 fractions   Hypercholesteremia    Hypertension    Ischemic cardiomyopathy    a. EF 50% by cath and 40-45% by echo 06/2017.   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   Meningioma [facility])    Nuclear stress test    Nuclear stress test 10/19: EF 35, inferolateral, apical scar; inferior, apical inferior infarct with mild peri-infarct ischemia; high risk   Obesity    Personal history of chemotherapy    Personal history of radiation therapy    Pituitary tumor    Seasonal asthma    Sleep apnea    does not use every  night   Stroke [facility])    TIA - Dec 1 st, 2017    Past Surgical History:  Procedure Laterality Date   ABDOMINAL HYSTERECTOMY     BRAIN MENINGIOMA EXCISION  2005   BREAST BIOPSY     BREAST LUMPECTOMY Right    2017   BREAST LUMPECTOMY WITH RADIOACTIVE SEED LOCALIZATION Right 04/20/2016   Procedure: RIGHT BREAST LUMPECTOMY WITH RADIOACTIVE SEED LOCALIZATION;  Surgeon: Elon Pacini, MD;  Location: [facility];  Service: General;  Laterality: Right;   BREAST REDUCTION SURGERY Bilateral 10/23/2016   Procedure: BILATERAL BREAST REDUCTION WITH LIPOSUCTION ASSISTANCE;  Surgeon: Marcus Lung, MD;  Location: [facility];  Service: Plastics;  Laterality: Bilateral;   CESAREAN SECTION     CORONARY STENT INTERVENTION N/A 06/25/2017   Procedure: CORONARY STENT INTERVENTION;  Surgeon: Wonda Sharper, MD;  Location: [facility] INVASIVE CV LAB;  Service: Cardiovascular;  Laterality: N/A;   CORONARY/GRAFT ACUTE MI REVASCULARIZATION N/A 06/25/2017   Procedure: Coronary/Graft Acute MI Revascularization;  Surgeon: Wonda Sharper, MD;  Location: [facility] INVASIVE CV LAB;  Service: Cardiovascular;  Laterality: N/A;  FOOT SURGERY Bilateral 2016   hammer toe surgery   LEFT HEART CATH AND CORONARY ANGIOGRAPHY N/A 06/25/2017   Procedure: LEFT HEART CATH AND CORONARY ANGIOGRAPHY;  Surgeon: Wonda Sharper, MD;  Location: [facility] INVASIVE CV LAB;  Service: Cardiovascular;  Laterality: N/A;   LEFT HEART CATH AND CORONARY ANGIOGRAPHY N/A 05/13/2020   Procedure: LEFT HEART CATH AND CORONARY ANGIOGRAPHY;  Surgeon: Court Dorn PARAS, MD;  Location: [facility] INVASIVE CV LAB;  Service: Cardiovascular;  Laterality: N/A;   MENISCUS REPAIR Left 2015   PITUITARY SURGERY     REDUCTION MAMMAPLASTY     RIGHT HEART CATH N/A 06/25/2017   Procedure: RIGHT HEART CATH;  Surgeon: Wonda Sharper, MD;  Location: [facility] INVASIVE CV LAB;  Service: Cardiovascular;  Laterality: N/A;   THYROID  SURGERY      MEDICATIONS:  acetaminophen   (TYLENOL ) 500 MG tablet   ADVAIR  HFA 115-21 MCG/ACT inhaler   albuterol  (VENTOLIN  HFA) 108 (90 Base) MCG/ACT inhaler   ALPRAZolam  (XANAX ) 0.25 MG tablet   Continuous Blood Gluc Sensor (FREESTYLE LIBRE 3 SENSOR) MISC   diclofenac Sodium (VOLTAREN) 1 % GEL   furosemide  (LASIX ) 40 MG tablet   glucose blood (FREESTYLE TEST STRIPS) test strip   HUMALOG  KWIKPEN 100 UNIT/ML KwikPen   LANTUS  SOLOSTAR 100 UNIT/ML Solostar Pen   metoprolol  succinate (TOPROL -XL) 25 MG 24 hr tablet   NIFEdipine  (PROCARDIA -XL/NIFEDICAL-XL) 30 MG 24 hr tablet   nitroGLYCERIN  (NITROSTAT ) 0.4 MG SL tablet   PRALUENT  75 MG/ML SOAJ   sacubitril -valsartan  (ENTRESTO ) 49-51 MG   valACYclovir  (VALTREX ) 1000 MG tablet   venlafaxine  XR (EFFEXOR -XR) 75 MG 24 hr capsule   Vitamin D, Ergocalciferol, (DRISDOL) 1.25 MG (50000 UNIT) CAPS capsule   No current facility-administered medications for this encounter.    Burnard CHRISTELLA Odis DEVONNA [facility] Short Stay/Anesthesiology [facility] Phone [phone] 03/10/2024 9:33 AM

## 2024-03-11 ENCOUNTER — Encounter (HOSPITAL_COMMUNITY)
Admission: RE | Disposition: A | Discharge status: Home / Self Care | Payer: Self-pay | Source: Ambulatory Visit | Attending: General Surgery

## 2024-03-11 ENCOUNTER — Encounter (HOSPITAL_COMMUNITY): Payer: Self-pay | Admitting: General Surgery

## 2024-03-11 ENCOUNTER — Inpatient Hospital Stay (HOSPITAL_COMMUNITY): Admitting: Anesthesiology

## 2024-03-11 ENCOUNTER — Inpatient Hospital Stay (HOSPITAL_COMMUNITY)

## 2024-03-11 ENCOUNTER — Inpatient Hospital Stay (HOSPITAL_COMMUNITY): Payer: Self-pay | Admitting: Physician Assistant

## 2024-03-11 ENCOUNTER — Inpatient Hospital Stay (HOSPITAL_COMMUNITY)
Admission: RE | Admit: 2024-03-11 | Discharge: 2024-03-12 | DRG: 621 | Disposition: A | Discharge status: Home / Self Care | Source: Ambulatory Visit | Attending: General Surgery | Admitting: General Surgery

## 2024-03-11 ENCOUNTER — Other Ambulatory Visit: Payer: Self-pay

## 2024-03-11 DIAGNOSIS — E66813 Obesity, class 3: Secondary | ICD-10-CM | POA: Diagnosis not present

## 2024-03-11 DIAGNOSIS — I2510 Atherosclerotic heart disease of native coronary artery without angina pectoris: Secondary | ICD-10-CM | POA: Diagnosis not present

## 2024-03-11 DIAGNOSIS — Z794 Long term (current) use of insulin: Secondary | ICD-10-CM

## 2024-03-11 DIAGNOSIS — E119 Type 2 diabetes mellitus without complications: Secondary | ICD-10-CM | POA: Diagnosis not present

## 2024-03-11 DIAGNOSIS — E66812 Obesity, class 2: Secondary | ICD-10-CM | POA: Diagnosis present

## 2024-03-11 DIAGNOSIS — Z79899 Other long term (current) drug therapy: Secondary | ICD-10-CM

## 2024-03-11 DIAGNOSIS — Z885 Allergy status to narcotic agent status: Secondary | ICD-10-CM

## 2024-03-11 DIAGNOSIS — Z888 Allergy status to other drugs, medicaments and biological substances status: Secondary | ICD-10-CM | POA: Diagnosis not present

## 2024-03-11 DIAGNOSIS — E1165 Type 2 diabetes mellitus with hyperglycemia: Secondary | ICD-10-CM | POA: Diagnosis present

## 2024-03-11 DIAGNOSIS — I10 Essential (primary) hypertension: Secondary | ICD-10-CM | POA: Diagnosis present

## 2024-03-11 DIAGNOSIS — Z7951 Long term (current) use of inhaled steroids: Secondary | ICD-10-CM | POA: Diagnosis not present

## 2024-03-11 DIAGNOSIS — K449 Diaphragmatic hernia without obstruction or gangrene: Secondary | ICD-10-CM | POA: Diagnosis not present

## 2024-03-11 DIAGNOSIS — Z7984 Long term (current) use of oral hypoglycemic drugs: Secondary | ICD-10-CM | POA: Diagnosis not present

## 2024-03-11 DIAGNOSIS — Z01818 Encounter for other preprocedural examination: Secondary | ICD-10-CM

## 2024-03-11 DIAGNOSIS — K219 Gastro-esophageal reflux disease without esophagitis: Secondary | ICD-10-CM | POA: Diagnosis not present

## 2024-03-11 DIAGNOSIS — Z6835 Body mass index (BMI) 35.0-35.9, adult: Secondary | ICD-10-CM

## 2024-03-11 DIAGNOSIS — K5730 Diverticulosis of large intestine without perforation or abscess without bleeding: Secondary | ICD-10-CM | POA: Diagnosis not present

## 2024-03-11 HISTORY — PX: UPPER GI ENDOSCOPY: SHX6162

## 2024-03-11 HISTORY — PX: LAPAROSCOPIC ROUX-EN-Y GASTRIC BYPASS WITH HIATAL HERNIA REPAIR: SHX6513

## 2024-03-11 LAB — GLUCOSE, CAPILLARY
Glucose-Capillary: 114 mg/dL — ABNORMAL HIGH (ref 70–99)
Glucose-Capillary: 189 mg/dL — ABNORMAL HIGH (ref 70–99)
Glucose-Capillary: 200 mg/dL — ABNORMAL HIGH (ref 70–99)
Glucose-Capillary: 223 mg/dL — ABNORMAL HIGH (ref 70–99)
Glucose-Capillary: 235 mg/dL — ABNORMAL HIGH (ref 70–99)

## 2024-03-11 LAB — CBC
HCT: 42.6 % (ref 36.0–46.0)
Hemoglobin: 13.8 g/dL (ref 12.0–15.0)
MCH: 29 pg (ref 26.0–34.0)
MCHC: 32.4 g/dL (ref 30.0–36.0)
MCV: 89.5 fL (ref 80.0–100.0)
Platelets: 286 K/uL (ref 150–400)
RBC: 4.76 MIL/uL (ref 3.87–5.11)
RDW: 13.5 % (ref 11.5–15.5)
WBC: 11.8 K/uL — ABNORMAL HIGH (ref 4.0–10.5)
nRBC: 0 % (ref 0.0–0.2)

## 2024-03-11 LAB — CREATININE, SERUM
Creatinine, Ser: 0.85 mg/dL (ref 0.44–1.00)
GFR, Estimated: >60 mL/min (ref >60)

## 2024-03-11 LAB — ABO/RH: ABO/RH(D): B POS

## 2024-03-11 SURGERY — CREATION, GASTRIC BYPASS, LAPAROSCOPIC, USING ROUX-EN-Y GASTROENTEROSTOMY, WITH HIATAL HERNIA REPAIR
Anesthesia: General | Site: Abdomen

## 2024-03-11 MED ORDER — MIDAZOLAM HCL 2 MG/2ML IJ SOLN
INTRAMUSCULAR | Status: AC
  Filled 2024-03-11: qty 2

## 2024-03-11 MED ORDER — PHENYLEPHRINE 80 MCG/ML (10ML) SYRINGE FOR IV PUSH (FOR BLOOD PRESSURE SUPPORT)
PREFILLED_SYRINGE | INTRAVENOUS | Status: AC
Start: 2024-03-11 — End: 2024-03-11
  Filled 2024-03-11: qty 10

## 2024-03-11 MED ORDER — FENTANYL CITRATE (PF) 100 MCG/2ML IJ SOLN
INTRAMUSCULAR | Status: DC | PRN
  Administered 2024-03-11 (×4): 25 ug via INTRAVENOUS
  Administered 2024-03-11: 50 ug via INTRAVENOUS
  Administered 2024-03-11 (×2): 25 ug via INTRAVENOUS
  Administered 2024-03-11: 100 ug via INTRAVENOUS

## 2024-03-11 MED ORDER — GABAPENTIN 100 MG PO CAPS
200.0000 mg | ORAL_CAPSULE | Freq: Two times a day (BID) | ORAL | Status: DC
  Administered 2024-03-11 – 2024-03-12 (×2): 200 mg via ORAL
  Filled 2024-03-11 (×2): qty 2

## 2024-03-11 MED ORDER — VENLAFAXINE HCL ER 37.5 MG PO CP24
75.0000 mg | ORAL_CAPSULE | Freq: Every day | ORAL | Status: DC
  Administered 2024-03-12: 75 mg via ORAL
  Filled 2024-03-11: qty 2

## 2024-03-11 MED ORDER — PHENYLEPHRINE 80 MCG/ML (10ML) SYRINGE FOR IV PUSH (FOR BLOOD PRESSURE SUPPORT)
PREFILLED_SYRINGE | INTRAVENOUS | Status: AC
  Filled 2024-03-11: qty 10

## 2024-03-11 MED ORDER — ONDANSETRON HCL 4 MG/2ML IJ SOLN: 4.0000 mg | Freq: Four times a day (QID) | INTRAMUSCULAR | Status: DC | PRN

## 2024-03-11 MED ORDER — ACETAMINOPHEN 500 MG PO TABS
1000.0000 mg | ORAL_TABLET | ORAL | Status: AC
  Administered 2024-03-11: 1000 mg via ORAL
  Filled 2024-03-11: qty 2

## 2024-03-11 MED ORDER — SIMETHICONE 80 MG PO CHEW
80.0000 mg | CHEWABLE_TABLET | Freq: Four times a day (QID) | ORAL | Status: DC | PRN
  Administered 2024-03-11: 80 mg via ORAL
  Filled 2024-03-11 (×2): qty 1

## 2024-03-11 MED ORDER — ORAL CARE MOUTH RINSE: 15.0000 mL | Freq: Once | OROMUCOSAL | Status: AC

## 2024-03-11 MED ORDER — FENTANYL CITRATE (PF) 100 MCG/2ML IJ SOLN
INTRAMUSCULAR | Status: AC
  Filled 2024-03-11: qty 2

## 2024-03-11 MED ORDER — CHLORHEXIDINE GLUCONATE CLOTH 2 % EX PADS: 6.0000 | MEDICATED_PAD | Freq: Once | CUTANEOUS | Status: DC

## 2024-03-11 MED ORDER — APREPITANT 40 MG PO CAPS
40.0000 mg | ORAL_CAPSULE | ORAL | Status: AC
  Administered 2024-03-11: 40 mg via ORAL
  Filled 2024-03-11: qty 1

## 2024-03-11 MED ORDER — OXYCODONE HCL 5 MG/5ML PO SOLN: 5.0000 mg | Freq: Four times a day (QID) | ORAL | Status: DC | PRN

## 2024-03-11 MED ORDER — INSULIN ASPART 100 UNIT/ML IJ SOLN: 0.0000 [IU] | INTRAMUSCULAR | Status: DC | PRN

## 2024-03-11 MED ORDER — DEXTROSE-SODIUM CHLORIDE 5-0.45 % IV SOLN: INTRAVENOUS | Status: DC

## 2024-03-11 MED ORDER — SODIUM CHLORIDE 0.9 % IV SOLN
2.0000 g | INTRAVENOUS | Status: AC
  Administered 2024-03-11: 2 g via INTRAVENOUS
  Filled 2024-03-11: qty 2

## 2024-03-11 MED ORDER — SACUBITRIL-VALSARTAN 49-51 MG PO TABS
1.0000 | ORAL_TABLET | Freq: Two times a day (BID) | ORAL | Status: DC
  Administered 2024-03-11 – 2024-03-12 (×2): 1 via ORAL
  Filled 2024-03-11 (×3): qty 1

## 2024-03-11 MED ORDER — SCOPOLAMINE 1 MG/3DAYS TD PT72
1.0000 | MEDICATED_PATCH | TRANSDERMAL | Status: DC
  Administered 2024-03-11: 1 mg via TRANSDERMAL
  Filled 2024-03-11: qty 1

## 2024-03-11 MED ORDER — LACTATED RINGERS IV SOLN: INTRAVENOUS | Status: DC

## 2024-03-11 MED ORDER — HEPARIN SODIUM (PORCINE) 5000 UNIT/ML IJ SOLN
5000.0000 [IU] | Freq: Three times a day (TID) | INTRAMUSCULAR | Status: DC
  Administered 2024-03-11 – 2024-03-12 (×3): 5000 [IU] via SUBCUTANEOUS
  Filled 2024-03-11 (×3): qty 1

## 2024-03-11 MED ORDER — HEPARIN SODIUM (PORCINE) 5000 UNIT/ML IJ SOLN
5000.0000 [IU] | INTRAMUSCULAR | Status: AC
  Administered 2024-03-11: 5000 [IU] via SUBCUTANEOUS
  Filled 2024-03-11: qty 1

## 2024-03-11 MED ORDER — FAMOTIDINE IN NACL 20-0.9 MG/50ML-% IV SOLN
20.0000 mg | Freq: Two times a day (BID) | INTRAVENOUS | Status: DC
  Administered 2024-03-11 – 2024-03-12 (×2): 20 mg via INTRAVENOUS
  Filled 2024-03-11 (×2): qty 50

## 2024-03-11 MED ORDER — ALBUTEROL SULFATE (2.5 MG/3ML) 0.083% IN NEBU: 2.5000 mg | INHALATION_SOLUTION | Freq: Four times a day (QID) | RESPIRATORY_TRACT | Status: DC | PRN

## 2024-03-11 MED ORDER — INSULIN ASPART 100 UNIT/ML IJ SOLN
0.0000 [IU] | INTRAMUSCULAR | Status: DC
  Administered 2024-03-11: 3 [IU] via SUBCUTANEOUS
  Administered 2024-03-11 – 2024-03-12 (×3): 5 [IU] via SUBCUTANEOUS
  Administered 2024-03-12: 2 [IU] via SUBCUTANEOUS
  Administered 2024-03-12: 5 [IU] via SUBCUTANEOUS
  Filled 2024-03-11: qty 5
  Filled 2024-03-11: qty 2
  Filled 2024-03-11: qty 5
  Filled 2024-03-11: qty 3
  Filled 2024-03-11: qty 5

## 2024-03-11 MED ORDER — MORPHINE SULFATE (PF) 2 MG/ML IV SOLN: 1.0000 mg | INTRAVENOUS | Status: DC | PRN

## 2024-03-11 MED ORDER — PHENYLEPHRINE 80 MCG/ML (10ML) SYRINGE FOR IV PUSH (FOR BLOOD PRESSURE SUPPORT)
PREFILLED_SYRINGE | INTRAVENOUS | Status: DC | PRN
  Administered 2024-03-11 (×5): 80 ug via INTRAVENOUS
  Administered 2024-03-11: 120 ug via INTRAVENOUS
  Administered 2024-03-11: 160 ug via INTRAVENOUS
  Administered 2024-03-11 (×3): 80 ug via INTRAVENOUS

## 2024-03-11 MED ORDER — 0.9 % SODIUM CHLORIDE (POUR BTL) OPTIME
TOPICAL | Status: DC | PRN
  Administered 2024-03-11: 1000 mL

## 2024-03-11 MED ORDER — PROPOFOL 10 MG/ML IV BOLUS
INTRAVENOUS | Status: DC | PRN
  Administered 2024-03-11: 150 mg via INTRAVENOUS
  Administered 2024-03-11: 30 mg via INTRAVENOUS

## 2024-03-11 MED ORDER — METOPROLOL SUCCINATE ER 25 MG PO TB24
25.0000 mg | ORAL_TABLET | Freq: Every day | ORAL | Status: DC
  Administered 2024-03-12: 25 mg via ORAL
  Filled 2024-03-11: qty 1

## 2024-03-11 MED ORDER — STERILE WATER FOR IRRIGATION IR SOLN
Status: DC | PRN
  Administered 2024-03-11: 1000 mL

## 2024-03-11 MED ORDER — EPHEDRINE SULFATE (PRESSORS) 25 MG/5ML IV SOSY
PREFILLED_SYRINGE | INTRAVENOUS | Status: DC | PRN
  Administered 2024-03-11 (×4): 5 mg via INTRAVENOUS

## 2024-03-11 MED ORDER — EPHEDRINE 5 MG/ML INJ
INTRAVENOUS | Status: AC
  Filled 2024-03-11: qty 5

## 2024-03-11 MED ORDER — BUPIVACAINE-EPINEPHRINE (PF) 0.25% -1:200000 IJ SOLN
INTRAMUSCULAR | Status: AC
  Filled 2024-03-11: qty 60

## 2024-03-11 MED ORDER — ONDANSETRON HCL 4 MG/2ML IJ SOLN
INTRAMUSCULAR | Status: DC | PRN
  Administered 2024-03-11: 4 mg via INTRAVENOUS

## 2024-03-11 MED ORDER — FLUTICASONE FUROATE-VILANTEROL 200-25 MCG/ACT IN AEPB
1.0000 | INHALATION_SPRAY | Freq: Every day | RESPIRATORY_TRACT | Status: DC
  Administered 2024-03-12: 1 via RESPIRATORY_TRACT
  Filled 2024-03-11: qty 28

## 2024-03-11 MED ORDER — ONDANSETRON HCL 4 MG/2ML IJ SOLN
INTRAMUSCULAR | Status: AC
  Filled 2024-03-11: qty 2

## 2024-03-11 MED ORDER — ONDANSETRON HCL 4 MG/2ML IJ SOLN
4.0000 mg | INTRAMUSCULAR | Status: DC | PRN
Start: 2024-03-11 — End: 2024-03-12

## 2024-03-11 MED ORDER — MIDAZOLAM HCL (PF) 2 MG/2ML IJ SOLN
INTRAMUSCULAR | Status: DC | PRN
  Administered 2024-03-11: 2 mg via INTRAVENOUS

## 2024-03-11 MED ORDER — LACTATED RINGERS IR SOLN
Status: DC | PRN
  Administered 2024-03-11: 1000 mL

## 2024-03-11 MED ORDER — ENSURE MAX PROTEIN PO LIQD
2.0000 [oz_av] | ORAL | Status: DC
  Administered 2024-03-12 (×6): 2 [oz_av] via ORAL

## 2024-03-11 MED ORDER — ACETAMINOPHEN 160 MG/5ML PO SOLN: 1000.0000 mg | Freq: Three times a day (TID) | ORAL | Status: DC

## 2024-03-11 MED ORDER — ACETAMINOPHEN 500 MG PO TABS
1000.0000 mg | ORAL_TABLET | Freq: Three times a day (TID) | ORAL | Status: DC
  Administered 2024-03-11 – 2024-03-12 (×4): 1000 mg via ORAL
  Filled 2024-03-11 (×4): qty 2

## 2024-03-11 MED ORDER — ALPRAZOLAM 0.25 MG PO TABS: 0.2500 mg | ORAL_TABLET | Freq: Two times a day (BID) | ORAL | Status: DC | PRN

## 2024-03-11 MED ORDER — ROCURONIUM BROMIDE 10 MG/ML (PF) SYRINGE
PREFILLED_SYRINGE | INTRAVENOUS | Status: DC | PRN
  Administered 2024-03-11: 20 mg via INTRAVENOUS
  Administered 2024-03-11 (×2): 10 mg via INTRAVENOUS
  Administered 2024-03-11: 50 mg via INTRAVENOUS

## 2024-03-11 MED ORDER — BUPIVACAINE-EPINEPHRINE 0.25% -1:200000 IJ SOLN
INTRAMUSCULAR | Status: DC | PRN
  Administered 2024-03-11: 60 mL

## 2024-03-11 MED ORDER — OXYCODONE HCL 5 MG/5ML PO SOLN: 5.0000 mg | Freq: Once | ORAL | Status: DC | PRN

## 2024-03-11 MED ORDER — NITROGLYCERIN 0.4 MG SL SUBL: 0.4000 mg | SUBLINGUAL_TABLET | SUBLINGUAL | Status: DC | PRN

## 2024-03-11 MED ORDER — ROCURONIUM BROMIDE 10 MG/ML (PF) SYRINGE
PREFILLED_SYRINGE | INTRAVENOUS | Status: AC
Start: 2024-03-11 — End: 2024-03-11
  Filled 2024-03-11: qty 10

## 2024-03-11 MED ORDER — BUPIVACAINE HCL (PF) 0.25 % IJ SOLN
INTRAMUSCULAR | Status: AC
  Filled 2024-03-11: qty 60

## 2024-03-11 MED ORDER — LIDOCAINE HCL (PF) 2 % IJ SOLN
INTRAMUSCULAR | Status: DC | PRN
  Administered 2024-03-11: 60 mg via INTRADERMAL

## 2024-03-11 MED ORDER — CHLORHEXIDINE GLUCONATE 0.12 % MT SOLN
15.0000 mL | Freq: Once | OROMUCOSAL | Status: AC
  Administered 2024-03-11: 15 mL via OROMUCOSAL

## 2024-03-11 MED ORDER — DEXAMETHASONE SOD PHOSPHATE PF 10 MG/ML IJ SOLN
4.0000 mg | INTRAMUSCULAR | Status: AC
  Administered 2024-03-11: 4 mg via INTRAVENOUS

## 2024-03-11 MED ORDER — OXYCODONE HCL 5 MG PO TABS: 5.0000 mg | ORAL_TABLET | Freq: Once | ORAL | Status: DC | PRN

## 2024-03-11 MED ORDER — SUGAMMADEX SODIUM 200 MG/2ML IV SOLN
INTRAVENOUS | Status: DC | PRN
  Administered 2024-03-11: 300 mg via INTRAVENOUS

## 2024-03-11 MED ORDER — PROPOFOL 10 MG/ML IV BOLUS
INTRAVENOUS | Status: AC
Start: 2024-03-11 — End: 2024-03-11
  Filled 2024-03-11: qty 20

## 2024-03-11 MED ORDER — LIDOCAINE HCL (PF) 2 % IJ SOLN
INTRAMUSCULAR | Status: AC
Start: 2024-03-11 — End: 2024-03-11
  Filled 2024-03-11: qty 5

## 2024-03-11 MED ORDER — STERILE WATER FOR IRRIGATION IR SOLN
Status: DC | PRN
  Administered 2024-03-11: 500 mL

## 2024-03-11 MED ORDER — BUPIVACAINE LIPOSOME 1.3 % IJ SUSP: 20.0000 mL | Freq: Once | INTRAMUSCULAR | Status: DC

## 2024-03-11 MED ORDER — FENTANYL CITRATE (PF) 50 MCG/ML IJ SOSY: 25.0000 ug | PREFILLED_SYRINGE | INTRAMUSCULAR | Status: DC | PRN

## 2024-03-11 SURGICAL SUPPLY — 57 items
BAG COUNTER SPONGE SURGICOUNT (BAG) ×2 IMPLANT
BENZOIN TINCTURE PRP APPL 2/3 (GAUZE/BANDAGES/DRESSINGS) IMPLANT
BLADE SURG SZ11 CARB STEEL (BLADE) ×2 IMPLANT
BNDG ADH 1X3 SHEER STRL LF (GAUZE/BANDAGES/DRESSINGS) IMPLANT
CHLORAPREP W/TINT 26 (MISCELLANEOUS) ×2 IMPLANT
CLIP APPLIE 5 13 M/L LIGAMAX5 (MISCELLANEOUS) IMPLANT
CLIP APPLIE ROT 10 11.4 M/L (STAPLE) IMPLANT
CLIP APPLIE ROT 13.4 12 LRG (CLIP) IMPLANT
COVER SURGICAL LIGHT HANDLE (MISCELLANEOUS) ×2 IMPLANT
DEVICE SUTURE ENDOST 10MM (ENDOMECHANICALS) ×2 IMPLANT
DRAIN CHANNEL 19F RND (DRAIN) IMPLANT
DRAIN PENROSE 0.25X18 (DRAIN) ×2 IMPLANT
ELECT PENCIL ROCKER SW 15FT (MISCELLANEOUS) IMPLANT
ELECTRODE L-HOOK LAP 45CM DISP (ELECTROSURGICAL) ×2 IMPLANT
EVACUATOR SILICONE 100CC (DRAIN) IMPLANT
GAUZE 4X4 16PLY ~~LOC~~+RFID DBL (SPONGE) ×2 IMPLANT
GAUZE SPONGE 4X4 12PLY STRL (GAUZE/BANDAGES/DRESSINGS) IMPLANT
GLOVE BIOGEL PI IND STRL 7.0 (GLOVE) ×2 IMPLANT
GLOVE SURG SS PI 7.0 STRL IVOR (GLOVE) ×2 IMPLANT
GOWN STRL REUS W/ TWL LRG LVL3 (GOWN DISPOSABLE) ×2 IMPLANT
GOWN STRL REUS W/ TWL XL LVL3 (GOWN DISPOSABLE) IMPLANT
GRASPER SUT TROCAR 14GX15 (MISCELLANEOUS) IMPLANT
IRRIGATION SUCT STRKRFLW 2 WTP (MISCELLANEOUS) ×2 IMPLANT
KIT BASIN OR (CUSTOM PROCEDURE TRAY) ×2 IMPLANT
KIT GASTRIC LAVAGE 34FR ADT (SET/KITS/TRAYS/PACK) IMPLANT
KIT PROCEDURE OLYMPUS (MISCELLANEOUS) IMPLANT
KIT TURNOVER KIT A (KITS) ×2 IMPLANT
MARKER SKIN DUAL TIP RULER LAB (MISCELLANEOUS) ×2 IMPLANT
MAT PREVALON FULL STRYKER (MISCELLANEOUS) ×2 IMPLANT
NDL SPNL 22GX3.5 QUINCKE BK (NEEDLE) ×2 IMPLANT
NEEDLE SPNL 22GX3.5 QUINCKE BK (NEEDLE) ×2 IMPLANT
PACK CARDIOVASCULAR III (CUSTOM PROCEDURE TRAY) ×2 IMPLANT
RELOAD STAPLE 60 2.6 WHT THN (STAPLE) ×6 IMPLANT
RELOAD STAPLE 60 3.6 BLU REG (STAPLE) ×8 IMPLANT
RELOAD STAPLE 60 3.8 GOLD REG (STAPLE) IMPLANT
RELOAD SUT SNGL STCH ABSRB 2-0 (ENDOMECHANICALS) ×10 IMPLANT
RELOAD SUT SNGL STCH BLK 2-0 (ENDOMECHANICALS) ×12 IMPLANT
SCISSORS LAP 5X45 EPIX DISP (ENDOMECHANICALS) ×2 IMPLANT
SET TUBE SMOKE EVAC HIGH FLOW (TUBING) ×2 IMPLANT
SHEARS HARMONIC 45 ACE (MISCELLANEOUS) ×2 IMPLANT
SLEEVE Z-THREAD 12X100MM (TROCAR) IMPLANT
SLEEVE Z-THREAD 5X100MM (TROCAR) ×6 IMPLANT
SOLUTION ANTFG W/FOAM PAD STRL (MISCELLANEOUS) ×2 IMPLANT
STAPLER ECHELON LONG 3000 60 (ENDOMECHANICALS) ×2 IMPLANT
STRIP CLOSURE SKIN 1/2X4 (GAUZE/BANDAGES/DRESSINGS) IMPLANT
SUT ETHIBOND 0 36 GRN (SUTURE) IMPLANT
SUT ETHILON 2 0 PS N (SUTURE) IMPLANT
SUT MNCRL AB 4-0 PS2 18 (SUTURE) ×2 IMPLANT
SUT SILK 0 SH 30 (SUTURE) IMPLANT
SUT VICRYL 0 TIES 12 18 (SUTURE) IMPLANT
SYR 20ML LL LF (SYRINGE) ×2 IMPLANT
SYR 50ML LL SCALE MARK (SYRINGE) ×2 IMPLANT
TOWEL OR 17X26 10 PK STRL BLUE (TOWEL DISPOSABLE) ×2 IMPLANT
TROCAR Z THREAD OPTICAL 12X100 (TROCAR) ×2 IMPLANT
TROCAR Z-THREAD OPTICAL 5X100M (TROCAR) ×2 IMPLANT
TUBING CONNECTING 10 (TUBING) ×2 IMPLANT
TUBING ENDO SMARTCAP (MISCELLANEOUS) IMPLANT

## 2024-03-11 NOTE — Op Note (Signed)
   Patient: Destiny Beasley (Oct 06, 1955, [id number])  Date of Surgery: 03/11/2024  Preoperative Diagnosis: MORBID OBESITY   Postoperative Diagnosis: MORBID OBESITY   Surgical Procedure: Upper Endoscopy   Surgeon: Deward Foy, MD  Anesthesiologist: Maryclare Cornet, MD CRNA: Metta Andrea NOVAK, CRNA; Joshua Vernell BROCKS, CRNA   Anesthesia: General   Fluids:  Total I/O In: -  Out: 50 [Blood:50]  Complications: None  Drains:  None  Specimen: None   Indications for Procedure: AURIANNA EARLYWINE is a 68 y.o. female undergoing laparoscopic gastric bypass with hiatal hernia repair and an EGD was requested to evaluate foregut anatomy intraoperatively.  Description of Procedure: During the procedure, I scrubbed out and obtained the Olympus endoscope. I gently placed endoscope in the patient's oropharynx and gently glided it down the esophagus without any difficulty under direct visualization.  The scope was advanced as far as the roux limb and then slowly withdrawn to inspect the foregut anatomy.  Dr. Stevie had placed saline in the upper abdomen and all staple lines were submerged to ensure no air leak. There was no evidence of bubbles. There was no evidence of intraluminal bleeding and the mucosa appeared healthy.  The lumen was widely patent without evidence of stricture.  The intraluminal insufflation was decompressed. The scope was withdrawn. The patient tolerated this portion of the procedure well. Please see Dr Roselynn operative note for details regarding the remainder of the procedure.    Deward Foy, MD General, Bariatric, & Minimally Invasive Surgery [facility], [place]

## 2024-03-11 NOTE — Anesthesia Postprocedure Evaluation (Signed)
 Anesthesia Post Note  Patient: Destiny Beasley  Procedure(s) Performed: CREATION, GASTRIC BYPASS, LAPAROSCOPIC, USING ROUX-EN-Y GASTROENTEROSTOMY, WITH HIATAL HERNIA REPAIR (Abdomen) ENDOSCOPY, UPPER GI TRACT     Patient location during evaluation: PACU Anesthesia Type: General Level of consciousness: awake and alert Pain management: pain level controlled Vital Signs Assessment: post-procedure vital signs reviewed and stable Respiratory status: spontaneous breathing, nonlabored ventilation, respiratory function stable and patient connected to nasal cannula oxygen  Cardiovascular status: blood pressure returned to baseline and stable Postop Assessment: no apparent nausea or vomiting Anesthetic complications: no   No notable events documented.  Last Vitals:  Vitals:   03/11/24 1200 03/11/24 1230  BP: (!) 158/76 (!) 164/77  Pulse: 95 92  Resp: 14 15  Temp:    SpO2: 93% 92%    Last Pain:  Vitals:   03/11/24 1130  TempSrc:   PainSc: 0-No pain                 [name] S

## 2024-03-11 NOTE — Progress Notes (Signed)
Discussed QI "Goals for Discharge" document with patient including ambulation in halls, Incentive Spirometry use every hour, and oral care.  Also discussed pain and nausea control.  Enabled or verified head of bed 30 degree alarm activated.  BSTOP education provided including BSTOP information guide, "Guide for Pain Management after your Bariatric Procedure".  Diet progression education provided including "Bariatric Surgery Post-Op Food Plan Phase 1: Liquids".  Questions answered.  Will continue to partner with bedside RN and follow up with patient per protocol.  

## 2024-03-11 NOTE — Anesthesia Procedure Notes (Signed)
 Procedure Name: Intubation Date/Time: 03/11/2024 7:38 AM  Performed by: Joshua Vernell BROCKS, CRNAPre-anesthesia Checklist: Patient identified, Emergency Drugs available, Suction available and Patient being monitored Patient Re-evaluated:Patient Re-evaluated prior to induction Oxygen  Delivery Method: Circle system utilized Preoxygenation: Pre-oxygenation with 100% oxygen  Induction Type: IV induction Ventilation: Mask ventilation without difficulty Laryngoscope Size: Glidescope and 3 Grade View: Grade I Tube type: Oral Tube size: 7.5 mm Number of attempts: 1 Airway Equipment and Method: Stylet, Video-laryngoscopy and Rigid stylet Placement Confirmation: ETT inserted through vocal cords under direct vision, positive ETCO2 and breath sounds checked- equal and bilateral Secured at: 21 cm Tube secured with: Tape Dental Injury: Teeth and Oropharynx as per pre-operative assessment  Difficulty Due To: Difficulty was anticipated and Difficult Airway- due to anterior larynx Comments: Glidescope used due to previous intubation note stating  difficult intubation due to anterior and deviated cords to the left. Easy intubation with glidescope.

## 2024-03-11 NOTE — Op Note (Addendum)
 Preop Diagnosis: Obesity Class III  Postop Diagnosis: same  Procedure performed: laparoscopic Roux en Y gastric bypass, laparoscopic hiatal hernia repair  Assitant: Deward Foy  Indications:  The patient is a 68 y.o. year-old morbidly obese female who has been followed in the Bariatric Clinic as an outpatient. This patient was diagnosed with morbid obesity with a BMI of Body mass index is 35.21 kg/m. and significant co-morbidities including hypertension, non-insulin  dependent diabetes, and GERD.  The patient was counseled extensively in the Bariatric Outpatient Clinic and after a thorough explanation of the risks and benefits of surgery (including death from complications, bowel leak, infection such as peritonitis and/or sepsis, internal hernia, bleeding, need for blood transfusion, bowel obstruction, organ failure, pulmonary embolus, deep venous thrombosis, wound infection, incisional hernia, skin breakdown, and others entailed on the consent form) and after a compliant diet and exercise program, the patient was scheduled for an elective laparoscopic gastric bypass.  Description of Operation:  Following informed consent, the patient was taken to the operating room and placed on the operating table in the supine position.  She had previously received prophylactic antibiotics and subcutaneous heparin  for DVT prophylaxis in the pre-op holding area.  After induction of general endotracheal anesthesia by the anesthesiologist, the patient underwent placement of sequential compression devices, Foley catheter and an oro-gastric tube.  A timeout was confirmed by the surgery and anesthesia teams.  The patient was adequately padded at all pressure points and placed on a footboard to prevent slippage from the OR table during extremes of position during surgery.  She underwent a routine sterile prep and drape of her entire abdomen.    Next, A transverse incision was made under the left subcostal area and a  5mm optical viewing trocar was introduced into the peritoneal cavity. Pneumoperitoneum was applied with a high flow and low pressure. A laparoscope was inserted to confirm placement. A extraperitoneal block was then placed at the lateral abdominal wall using exparel  diluted with marcaine  . 5 additional trocars were placed: 1 5mm trocar to the left of the midline. 1 additional 5mm trocar in the left lateral area, 1 12mm trocar in the right mid abdomen, and 1 5mm trocar in the right subcostal area.  The greater omentum was flipped over the transverse colon and under the left lobe of the liver. The ligament of trietz was identified. 40cm of jejunum was measured starting from the ligament of Trietz. The mesentery was checked to ensure mobility. Next, a 60mm white load echelon stapler was used to divide the jejunum at this location. The harmonic scalpel was used to divide the mesentery down to the origin. A 1/2 penrose was sutured to the distal side. 100cm of jejunum was measured starting at the division. 2-0 silk was used to appose the biliary limb to the 100cm mark of jejunum in 2 places. Enterotomies were made in the biliary and common channels and a 60mm white load echelon stapler was used to create the J-J anastomosis. A 2-0 silk was used to appose the enterotomy edges and a 60mm white load echelon stapler was used to close the enterotomy. An anti-obstruction 2-0 silk suture was placed. Next, the mesenteric defect was closed with a 2-0 silk in running fashion.The J-J appeared patent and in neutral position.  Next, the omentum was divided using the Harmonic scalpel. The patient was placed in steep Reverse Trendelenberg position. A Nathanson retracted was placed through a subxiphoid incision and used to retract the liver.    The UGI showed  a moderate hiatal hernia. Therefore, the pars flaccida was incised with harmonic scalpel. The stomach was reduced but on dissection of the posterior crus there was a visible  hernia with small sac. The sac was separated from the crus in 360 degrees. Additional dissection was performed to mobilize the esophagus in the lower chest. 3 0 ethibond sutures placed in interrupted fashion.   The fat pad over the fundus was incised to free the fundus. Next, a position along the lesser curve 6 cm from GE junction was identified. The pars flaccida was entered and the fat over the lesser curve divided to enter the lesser sac. Multiple 60mm blue load echelon stapler firings were peformed to create a 6cm pouch. The Roux limb was identified using the placed penrose and brought up to the stomach in antecolic fashion. The limb was inspected to ensure a neutral position. Next cautery was used to create an enterotomy along the medial aspect of this suture line and Harmonic scalpel used to create gastotomy. A 60 mm blue load echelon stapler was then used to create a 25-74mm anastomosis. 2 2-0 vicryl sutures were used in running fashion to close the gastrotomy. Finally, a 2-0 vicryl suture was used to close an anterior layer of stomach and jejunum over the anastomosis in running fashion. The penrose was removed from the Roux limb. A 2-0 silk was used to appose the transverse mesocolon to the mesentery of the Roux limb.   The assistant then went and performed an upper endoscopy and leak test. No bubbles were seen and the pouch and limb distended appropriately. The limb and pouch were deflated, the endoscope was removed. Hemostasis was ensured. Pneumoperitoneum was evacuated, all ports were removed and all incisions closed with 4-0 monocryl suture in subcuticular fashion. Steristrips and bandaids were put in place for dressing. The patient awoke from anesthesia and was brought to pacu in stable condition. All counts were correct.  Specimens:  None  Estimated Blood Loss: 20 ml  Local Anesthesia: 60 ml Marcaine   Post-Op Plan:       Pain Management: PO, prn      Antibiotics: Prophylactic       Anticoagulation: Prophylactic, Starting now      Post Op Studies/Consults: Not applicable      Intended Discharge: within 48h      Intended Outpatient Follow-Up: Two Week      Intended Outpatient Studies: Not Applicable      Other: Not Applicable   Herlene Righter Nekeisha Aure

## 2024-03-11 NOTE — Progress Notes (Signed)
 PHARMACY CONSULT FOR:  Risk Assessment for Post-Discharge VTE Following Bariatric Surgery  Procedure*  laparoscopic Roux en Y gastric bypass, laparoscopic hiatal hernia repair   Sex F  Black race Y  Age (years) 68  BMI (kg/m2) 35.21  Operation duration (minutes) 92  History of VTE requiring treatment* N  Hypercoagulable condition* N  Liver disorder* N  Pre-op venous stasis N  Pre-op functional health status Independent  Previous foregut or bariatric surg N  Post-op surgical site infection N  Transfusion intra- or post-op* N  Unplanned readmission N  Unplanned reoperation N  GI perforation/leak/obstruction* n  *specific risk factors for portomesenteric venous thrombosis   Predicted probability of 30-day post-discharge VTE:    0.36% estimated using the [facility] Calculator  Other patient-specific factors to consider: n/a  Recommendation for Discharge: No pharmacologic prophylaxis post-discharge  TASHENA IBACH is a 68 y.o. female who underwent  laparoscopic Roux en Y gastric bypass, laparoscopic hiatal hernia repair 03/11/2024   Allergies  Allergen Reactions   Hydrocodone  Other (See Comments)    Severe anxiety Confusion    Invokana  [Canagliflozin ] Other (See Comments)    Constant yeast infections   Cardizem [Diltiazem Hcl] Other (See Comments)    muscle weakness   Cardura  [Doxazosin ]     LEG CRAMPS    Chlorthalidone  Other (See Comments)    Leg pain   Coreg  [Carvedilol ] Other (See Comments)    Wheezing with 25mg  BID dosing NOT ALLERGIC 10/08/20   Crestor  [Rosuvastatin ] Other (See Comments)    myalgias   Hydralazine  Other (See Comments)    Fatigue   Imdur  [Isosorbide  Nitrate] Other (See Comments)    Patient reported wheezing   Metformin  Hcl Nausea Only   Norvasc  [Amlodipine ] Other (See Comments)    Muscles hurt   Repatha  [Evolocumab ] Other (See Comments)    Injection site pain   Semaglutide  Other (See Comments)    wheezing    Spironolactone  Other (See Comments)    Wheezing vs fatigue?   Tirzepatide  Other (See Comments)    stomach burning   Valsartan  Dermatitis   Zocor [Simvastatin] Other (See Comments)    myalgias   Atorvastatin  Other (See Comments)    DIZZINESS   Compazine  [Prochlorperazine ] Anxiety    Patient Measurements: Height: 5' 11.5 (181.6 cm) Weight: 116.1 kg (256 lb) IBW/kg (Calculated) : 71.95 Body mass index is 35.21 kg/m.  No results for input(s): WBC, HGB, HCT, PLT, APTT, CREATININE, LABCREA, CREAT24HRUR, MG, PHOS, ALBUMIN, PROT, AST, ALT, ALKPHOS, BILITOT, BILIDIR, IBILI in the last 72 hours. Estimated Creatinine Clearance: 97.8 mL/min (by C-G formula based on SCr of 0.73 mg/dL).    Past Medical History:  Diagnosis Date   Anemia    Anxiety    CAD in native artery    a. Inf STEMI 06/2017 complicated by cardiogenic shock/complete heart block s/p emergent cath showing culprit large dominant LCx s/p aspiration thrombectomy and DES, EF 50% by cath, 40-45% by echo.   Chronic systolic heart failure (HCC) 06/25/2017   Echo 11/19: mild LVH, EF 40-45, inf-lat HK, Gr 1 DD, trivial MR   Complete heart block, transient (HCC)    a. 06/2017 in setting of acute inf MI -> resolved after PCI.   COVID-19    Diabetes mellitus    Goiter 2005   History of radiation therapy 06/14/2016 - 07/19/2016   Right Breast 50 Gy 25 fractions   Hypercholesteremia    Hypertension    Ischemic cardiomyopathy  a. EF 50% by cath and 40-45% by echo 06/2017.   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   Meningioma [facility])    Nuclear stress test    Nuclear stress test 10/19: EF 35, inferolateral, apical scar; inferior, apical inferior infarct with mild peri-infarct ischemia; high risk   Obesity    Personal history of chemotherapy    Personal history of radiation therapy    Pituitary tumor     Seasonal asthma    Sleep apnea    does not use every night   Stroke [facility])    TIA - Dec 1 st, 2017     Medications Prior to Admission  Medication Sig Dispense Refill Last Dose/Taking   acetaminophen  (TYLENOL ) 500 MG tablet Take 1,000 mg by mouth every 6 (six) hours as needed for moderate pain (pain score 4-6).   Past Week   ADVAIR  HFA 115-21 MCG/ACT inhaler Inhale 2 puffs into the lungs in the morning and at bedtime.   03/10/2024   albuterol  (VENTOLIN  HFA) 108 (90 Base) MCG/ACT inhaler Inhale 1-2 puffs into the lungs every 6 (six) hours as needed for wheezing or shortness of breath.   Unknown   ALPRAZolam  (XANAX ) 0.25 MG tablet Take 0.25 mg by mouth 2 (two) times daily as needed for anxiety.   03/11/2024 at  4:30 AM   diclofenac Sodium (VOLTAREN) 1 % GEL Apply 4 g topically 4 (four) times daily.   Past Week   furosemide  (LASIX ) 40 MG tablet Take 1 tablet (40 mg total) by mouth daily. 90 tablet 3 03/10/2024   HUMALOG  KWIKPEN 100 UNIT/ML KwikPen Inject 6 Units into the skin with breakfast, with lunch, and with evening meal.   03/10/2024   LANTUS  SOLOSTAR 100 UNIT/ML Solostar Pen INJECT SUBCUTANEOUSLY 70  UNITS DAILY 75 mL 3 03/10/2024   metoprolol  succinate (TOPROL -XL) 25 MG 24 hr tablet Take 1 tablet (25 mg total) by mouth daily. Take with or immediately following a meal. 90 tablet 3 03/11/2024 at  4:30 AM   NIFEdipine  (PROCARDIA -XL/NIFEDICAL-XL) 30 MG 24 hr tablet Take 1 tablet (30 mg total) by mouth every evening. 30 tablet 5 03/10/2024   nitroGLYCERIN  (NITROSTAT ) 0.4 MG SL tablet Place 1 tablet (0.4 mg total) under the tongue every 5 (five) minutes as needed for chest pain. 25 tablet 4 Taking As Needed   PRALUENT  75 MG/ML SOAJ ADMINISTER 1 ML (75 MG) UNDER THE SKIN EVERY 14 DAYS 6 mL 0 Past Month   sacubitril -valsartan  (ENTRESTO ) 49-51 MG TAKE 1 TABLET BY MOUTH TWICE DAILY 180 tablet 2 03/10/2024   venlafaxine  XR (EFFEXOR -XR) 75 MG 24 hr capsule Take 75 mg by mouth daily with breakfast.    03/11/2024 at  4:30 AM   Vitamin D, Ergocalciferol, (DRISDOL) 1.25 MG (50000 UNIT) CAPS capsule Take 50,000 Units by mouth every Wednesday.   Past Month   Continuous Blood Gluc Sensor (FREESTYLE LIBRE 3 SENSOR) MISC APPLY 1 SENSOR TO UPPER ARM  EVERY 14 DAYS FOR CONTINUOUS  GLUCOSE MONITORING 7 each 3    glucose blood (FREESTYLE TEST STRIPS) test strip Use as instructed 100 each 12    valACYclovir  (VALTREX ) 1000 MG tablet Take 1,000 mg by mouth 2 (two) times daily as needed (fever blisters).   More than a month    Rosaline IVAR Edison, Pharm.D Use secure chat for questions 03/11/2024 11:28 AM

## 2024-03-11 NOTE — H&P (Signed)
 Chief Complaint:   Chief Complaint  Patient presents with  Return Weight Loss  Preop for surgery date 03/11/24; LGB and upper endo   Subjective   Destiny Beasley is a 68 y.o. female established patient in today for: History of Present Illness Destiny Beasley is a 68 year old female with diabetes who presents for pre-operative consultation for gastric bypass surgery.  Her diabetes management shows recent hemoglobin A1c levels of 7.9 and 8.0. She inquires about the acceptable range for A1c levels in preparation for surgery.  She has a history of breast augmentation on the right side on October 5th and a tummy tuck several years ago.  She has attended a dietitian class and purchased vitamins as part of her preparation for gastric bypass surgery.  There is no problem list on file for this patient.  Outpatient Medications Prior to Visit  Medication Sig Dispense Refill  ALPRAZolam  (XANAX ) 0.25 MG tablet Take 1 tablet by mouth 2 (two) times daily  blood-glucose sensor (FREESTYLE LIBRE 3 SENSOR)  fluticasone  propion-salmeteroL (ADVAIR  DISKUS) 250-50 mcg/dose diskus inhaler Inhale 1 Puff into the lungs 2 (two) times daily  FUROsemide  (LASIX ) 40 MG tablet  insulin  GLARGINE (LANTUS  SOLOSTAR U-100 INSULIN ) pen injector (concentration 100 units/mL) ADMINISTER 70 UNITS UNDER THE SKIN DAILY  meloxicam (MOBIC) 15 MG tablet TAKE 1 TABLET BY MOUTH ONCE DAILY Oral; Duration: 30 Days  metoprolol  succinate (TOPROL -XL) 50 MG XL tablet  ONETOUCH ULTRA TEST test strip CHECK GLUCOSE THREE TIMES DAILY  ONETOUCH ULTRA2 METER Misc AS DIRECTED TO CHECK BLOOD SUGAR THREE TIMES DAILY  PRALUENT  PEN 75 mg/mL pen injector Inject 1 mL subcutaneously every 14 (fourteen) days  valACYclovir  (VALTREX ) 1000 MG tablet Take 1 tablet by mouth 2 (two) times daily  venlafaxine  (EFFEXOR -XR) 150 MG XR capsule TAKE 1 CAPSULE BY MOUTH EVERY DAY WITH FOOD   No facility-administered medications prior to visit.    Objective    Vitals:  02/15/24 1444  BP: (!) 174/83  Pulse: 83  Temp: 36.7 C (98.1 F)  TempSrc: Temporal  SpO2: 94%  Weight: (!) 118.1 kg (260 lb 6.4 oz)  Height: 181.6 cm (5' 11.5)  PainSc: 0-No pain   Body mass index is 35.81 kg/m. Physical Exam Constitutional:  Appearance: Normal appearance.  HENT:  Head: Normocephalic and atraumatic.  Pulmonary:  Effort: Pulmonary effort is normal.  Musculoskeletal:  General: Normal range of motion.  Cervical back: Normal range of motion.  Neurological:  General: No focal deficit present.  Mental Status: She is alert and oriented to person, place, and time. Mental status is at baseline.  Psychiatric:  Mood and Affect: Mood normal.  Behavior: Behavior normal.  Thought Content: Thought content normal.     Assessment/Plan:   Assessment & Plan Class 2 Severe Obesity Scheduled for laparoscopic Roux-en-Y gastric bypass on November 18th. Procedure involves creating a small stomach pouch and bypassing most of the stomach and initial intestine to aid weight loss. Explained procedure, risks, and post-operative care, including dietary changes and potential long-term issues. - Proceed with laparoscopic Roux-en-Y gastric bypass on November 18th. - Ensure pre-operative appointment and blood work are completed in November. - Adhere to post-operative dietary guidelines: liquid diet for two weeks, then high-protein diet. - Monitor for dehydration and vitamin deficiencies; set up fluid clinic if necessary. - Avoid NSAIDs and smoking to reduce ulcer risk. - Monitor for signs of internal hernia or bowel obstruction. - Administer blood thinners before and after surgery to prevent blood clots. - Educate on the  importance of staying active post-surgery to prevent complications. Diagnoses and all orders for this visit:  Class 2 severe obesity with body mass index (BMI) of 35 to 39.9 with serious comorbidity  Type 2 diabetes mellitus with hyperglycemia, with  long-term current use of insulin  (CMS/HHS-HCC)

## 2024-03-11 NOTE — Transfer of Care (Signed)
 Immediate Anesthesia Transfer of Care Note  Patient: Destiny Beasley  Procedure(s) Performed: CREATION, GASTRIC BYPASS, LAPAROSCOPIC, USING ROUX-EN-Y GASTROENTEROSTOMY, WITH HIATAL HERNIA REPAIR (Abdomen) ENDOSCOPY, UPPER GI TRACT  Patient Location: PACU  Anesthesia Type:General  Level of Consciousness: awake, alert , and patient cooperative  Airway & Oxygen  Therapy: Patient Spontanous Breathing and Patient connected to face mask oxygen   Post-op Assessment: Report given to RN and Post -op Vital signs reviewed and stable  Post vital signs: Reviewed and stable  Last Vitals:  Vitals Value Taken Time  BP 164/86 03/11/24 10:55  Temp    Pulse 96 03/11/24 10:57  Resp 14 03/11/24 10:57  SpO2 100 % 03/11/24 10:57  Vitals shown include unfiled device data.  Last Pain:  Vitals:   03/11/24 0602  TempSrc: Oral  PainSc:          Complications: No notable events documented.

## 2024-03-12 ENCOUNTER — Other Ambulatory Visit (HOSPITAL_COMMUNITY): Payer: Self-pay

## 2024-03-12 ENCOUNTER — Telehealth (HOSPITAL_COMMUNITY): Payer: Self-pay | Admitting: Pharmacy Technician

## 2024-03-12 ENCOUNTER — Encounter (HOSPITAL_COMMUNITY): Payer: Self-pay | Admitting: General Surgery

## 2024-03-12 LAB — CBC WITH DIFFERENTIAL/PLATELET
Abs Immature Granulocytes: 0.02 K/uL (ref 0.00–0.07)
Basophils Absolute: 0 K/uL (ref 0.0–0.1)
Basophils Relative: 1 %
Eosinophils Absolute: 0 K/uL (ref 0.0–0.5)
Eosinophils Relative: 1 %
HCT: 38.8 % (ref 36.0–46.0)
Hemoglobin: 12.4 g/dL (ref 12.0–15.0)
Immature Granulocytes: 0 %
Lymphocytes Relative: 27 %
Lymphs Abs: 2.4 K/uL (ref 0.7–4.0)
MCH: 28.9 pg (ref 26.0–34.0)
MCHC: 32 g/dL (ref 30.0–36.0)
MCV: 90.4 fL (ref 80.0–100.0)
Monocytes Absolute: 0.7 K/uL (ref 0.1–1.0)
Monocytes Relative: 8 %
Neutro Abs: 5.7 K/uL (ref 1.7–7.7)
Neutrophils Relative %: 63 %
Platelets: 264 K/uL (ref 150–400)
RBC: 4.29 MIL/uL (ref 3.87–5.11)
RDW: 13.7 % (ref 11.5–15.5)
WBC: 8.9 K/uL (ref 4.0–10.5)
nRBC: 0 % (ref 0.0–0.2)

## 2024-03-12 LAB — GLUCOSE, CAPILLARY
Glucose-Capillary: 215 mg/dL — ABNORMAL HIGH (ref 70–99)
Glucose-Capillary: 231 mg/dL — ABNORMAL HIGH (ref 70–99)
Glucose-Capillary: 145 mg/dL — ABNORMAL HIGH (ref 70–99)

## 2024-03-12 MED ORDER — PANTOPRAZOLE SODIUM 40 MG PO TBEC: 40.0000 mg | DELAYED_RELEASE_TABLET | Freq: Every day | ORAL | 0 refills | Status: AC

## 2024-03-12 MED ORDER — LANCET DEVICE MISC: 1.0000 | 0 refills | Status: AC

## 2024-03-12 MED ORDER — ONDANSETRON 4 MG PO TBDP: 4.0000 mg | ORAL_TABLET | Freq: Four times a day (QID) | ORAL | 0 refills | Status: DC | PRN

## 2024-03-12 MED ORDER — LANCETS MISC: 1.0000 | 0 refills | Status: AC

## 2024-03-12 MED ORDER — BLOOD GLUCOSE MONITORING SUPPL DEVI: 1.0000 | 0 refills | Status: AC

## 2024-03-12 MED ORDER — PEN NEEDLES 31G X 5 MM MISC: 1.0000 | 0 refills | Status: AC

## 2024-03-12 MED ORDER — VITAMIN D (ERGOCALCIFEROL) 1.25 MG (50000 UNIT) PO CAPS: 50000.0000 [IU] | ORAL_CAPSULE | ORAL | 3 refills | Status: AC

## 2024-03-12 MED ORDER — BLOOD GLUCOSE TEST VI STRP: 1.0000 | ORAL_STRIP | 0 refills | Status: AC

## 2024-03-12 MED ORDER — INSULIN LISPRO (1 UNIT DIAL) 100 UNIT/ML (KWIKPEN): 0.0000 [IU] | PEN_INJECTOR | Freq: Three times a day (TID) | SUBCUTANEOUS | 0 refills | Status: AC

## 2024-03-12 NOTE — Inpatient Diabetes Management (Signed)
 Inpatient Diabetes Program Recommendations  AACE/ADA: New Consensus Statement on Inpatient Glycemic Control (2015)  Target Ranges:  Prepandial:   less than 140 mg/dL      Peak postprandial:   less than 180 mg/dL (1-2 hours)      Critically ill patients:  140 - 180 mg/dL   Lab Results  Component Value Date   GLUCAP 145 (H) 03/12/2024   HGBA1C 8.7 (H) 02/29/2024    Review of Glycemic Control  Latest Reference Range & Units 03/11/24 23:52 03/12/24 03:56 03/12/24 07:55 03/12/24 11:41  Glucose-Capillary 70 - 99 mg/dL 810 (H) 784 (H) 768 (H) 145 (H)  (H): Data is abnormally high  Diabetes history: DM2  Outpatient Diabetes medications:  Lantus  70 units every day Humalog  10 units TID Freestyle Libre 3  Current orders for Inpatient glycemic control:  Novolog  0-15 units Q4H  Met with patient at bedside.  She confirms above home medications.  Gastric bypass and hernia repair on 11/18.  Had decadron  for OR.    Please consider Humalog  sensitive correction TID and HS for discharge.  Call PCP if glucose trends become elevated.  She is wearing a Freestyle Libre 3 CGM.  Discussed hyper and hypoglycemia, signs, symptoms and treatments.  Discharge Recommendations: Short acting recommendations:  Correction coverage ONLY Insulin  lispro (HUMALOG ) KwikPen  Sensitive Scale.     Use Adult Diabetes Insulin  Treatment Post Discharge order set.  Thank you, Wyvonna Pinal, MSN, CDCES Diabetes Coordinator Inpatient Diabetes Program [phone] (team pager from 8a-5p)

## 2024-03-12 NOTE — Progress Notes (Signed)
 Discharge instructions given to patient and all questions were answered.

## 2024-03-12 NOTE — Progress Notes (Signed)
   03/12/24 1032  TOC Brief Assessment  Insurance and Status Reviewed  Patient has primary care physician Yes  Home environment has been reviewed resides in a private residence  Prior level of function: Independent  Prior/Current Home Services No current home services  Social Drivers of Health Review SDOH reviewed no interventions necessary  Readmission risk has been reviewed Yes  Transition of care needs no transition of care needs at this time

## 2024-03-12 NOTE — Progress Notes (Signed)
 Patient alert and oriented, pain is controlled. Patient is tolerating fluids, advanced to protein shake today, patient is tolerating well. Reviewed Gastric sleeve/bypass discharge instructions with patient and patient is able to articulate understanding. Provided information on BELT program, Support Group, BSTOP-D, and [facility] outpatient pharmacy. Communicated general update of patient status to surgeon. All questions answered. 24hr fluid recall is 1380 per hydration protocol, bariatric nurse coordinator to make follow-up phone call within one week.

## 2024-03-12 NOTE — Progress Notes (Signed)
 Had a great night, on last cup of protein.  Denies nausea and pain.  IS At 1500, ambulating.  Discussed goals for discharge, plan to come this morning to review bariatric discharge education.  Questions answered

## 2024-03-12 NOTE — Telephone Encounter (Signed)
 Patient Product/process Development Scientist completed.    The patient is insured through [employer]/ [employer]. Patient has Medicare and is not eligible for a copay card, but may be able to apply for patient assistance or Medicare RX Payment Plan (Patient Must reach out to their plan, if eligible for payment plan), if available.    Ran test claim for Breo Ellpta 200-25 mcg and the current 30 day co-pay is $0.00.   This test claim was processed through [facility]- copay amounts may vary at other pharmacies due to pharmacy/plan contracts, or as the patient moves through the different stages of their insurance plan.     Reyes Sharps, CPHT Pharmacy Technician Patient Advocate Specialist Lead [facility] Patient Advocate Team Direct Number: [phone]  Fax: [phone]

## 2024-03-12 NOTE — Discharge Instructions (Signed)
 GASTRIC BYPASS / SLEEVE  Home Care Instructions  These instructions are to help you care for yourself when you go home.  Call: If you have any problems. Call [phone] and ask for the surgeon on call If you have an emergency related to your surgery please use the ER at [facility].  Tell the ER staff that you are a new post-op gastric bypass or gastric sleeve patient   Signs and symptoms to report: Severe vomiting or nausea If you cannot handle clear liquids for longer than 1 day, call your surgeon  Abdominal pain which does not get better after taking your pain medication Fever greater than 100.4 F and chills Heart rate over 100 beats a minute Trouble breathing Chest pain  Redness, swelling, drainage, or foul odor at incision (surgical) sites  If your incisions open or pull apart Swelling or pain in calf (lower leg) Diarrhea (Loose bowel movements that happen often), frequent watery, uncontrolled bowel movements Constipation, (no bowel movements for 3 days) if this happens:  Take Milk of Magnesia, 2 tablespoons by mouth, 3 times a day for 2 days if needed Stop taking Milk of Magnesia once you have had a bowel movement Call your doctor if constipation continues Or Take Miralax  (instead of Milk of Magnesia) following the label instructions Stop taking Miralax once you have had a bowel movement Call your doctor if constipation continues Anything you think is "abnormal for you"   Normal side effects after surgery: Unable to sleep at night or unable to concentrate Irritability Being tearful (crying) or depressed These are common complaints, possibly related to your anesthesia, stress of surgery and change in lifestyle, that usually go away a few weeks after surgery.  If these feelings continue, call your medical doctor.  Wound Care: You may have surgical glue, steri-strips, or staples over your incisions after surgery Surgical glue:  Looks like a clear film over your incisions  and will wear off a little at a time Steri-strips : Adhesive strips of tape over your incisions. You may notice a yellowish color on the skin under the steri-strips. This is used to make the   steri-strips stick better. Do not pull the steri-strips off - let them fall off Staples: Staples may be removed before you leave the hospital If you go home with staples, call [facility] at for an appointment with your surgeon's nurse to have staples removed 10 days after surgery, [phone] Showering: You may shower two (2) days after your surgery unless your surgeon tells you differently Wash gently around incisions with warm soapy water , rinse well, and gently pat dry  If you have a drain (tube from your incision), you may need someone to hold this while you shower  No tub baths until staples are removed and incisions are healed     Medications: Medications should be liquid or crushed if larger than the size of a dime Extended release pills (medication that releases a little bit at a time through the day) should not be crushed Depending on the size and number of medications you take, you may need to space (take a few throughout the day)/change the time you take your medications so that you do not over-fill your pouch (smaller stomach) Make sure you follow-up with your primary care physician to make medication changes needed during rapid weight loss and life-style changes If you have diabetes, follow up with the doctor that orders your diabetes medication(s) within one week after surgery and check  your blood sugar regularly. Do not drive while taking narcotics (pain medications) DO NOT take NSAID'S (Examples of NSAID's include ibuprofen, naproxen )  Diet:                    First 2 Weeks  You will see the nutritionist about two (2) weeks after your surgery. The nutritionist will increase the types of foods you can eat if you are handling liquids well: If you have severe vomiting or nausea  and cannot handle clear liquids lasting longer than 1 day, call your surgeon  Protein Shake Drink at least 2 ounces of shake 5-6 times per day Each serving of protein shakes (usually 8 - 12 ounces) should have a minimum of:  15 grams of protein  And no more than 5 grams of carbohydrate  Goal for protein each day: Men = 80 grams per day Women = 60 grams per day Protein powder may be added to fluids such as non-fat milk or Lactaid milk or Soy milk (limit to 35 grams added protein powder per serving)  Hydration Slowly increase the amount of water  and other clear liquids as tolerated (See Acceptable Fluids) Slowly increase the amount of protein shake as tolerated   Sip fluids slowly and throughout the day May use sugar substitutes in small amounts (no more than 6 - 8 packets per day; i.e. Splenda)  Fluid Goal The first goal is to drink at least 8 ounces of protein shake/drink per day (or as directed by the nutritionist);  See handout from pre-op Bariatric Education Class for examples of protein shake/drink.   Slowly increase the amount of protein shake you drink as tolerated You may find it easier to slowly sip shakes throughout the day It is important to get your proteins in first Your fluid goal is to drink 64 - 100 ounces of fluid daily It may take a few weeks to build up to this 32 oz (or more) should be clear liquids  And  32 oz (or more) should be full liquids (see below for examples) Liquids should not contain sugar, caffeine, or carbonation  Clear Liquids: Water  or Sugar-free flavored water  (i.e. Fruit H2O, Propel) Decaffeinated coffee or tea (sugar-free) Crystal Lite, Wyler's Lite, Minute Maid Lite Sugar-free Jell-O Bouillon or broth Sugar-free Popsicle:   *Less than 20 calories each; Limit 1 per day  Full Liquids: Protein Shakes/Drinks + 2 choices per day of other full liquids Full liquids must be: No More Than 12 grams of Carbs per serving  No More Than 3 grams of Fat  per serving Strained low-fat cream soup Non-Fat milk Fat-free Lactaid Milk Sugar-free yogurt (Dannon Lite & Fit, Greek yogurt)      Vitamins and Minerals Start 1 day after surgery unless otherwise directed by your surgeon Bariatric Specific Complete Multivitamins Chewable Calcium  Citrate with Vitamin D -3 (Example: 3 Chewable Calcium  Plus 600 with Vitamin D -3) Take 500 mg three (3) times a day for a total of 1500 mg each day Do not take all 3 doses of calcium  at one time as it may cause constipation, and you can only absorb 500 mg  at a time  Do not mix multivitamins containing iron with calcium  supplements; take 2 hours apart  Menstruating women and those at risk for anemia (a blood disease that causes weakness) may need extra iron Talk with your doctor to see if you need more iron If you need extra iron: Total daily Iron recommendation (including Vitamins) is 50 to 100  mg Iron/day Do not stop taking or change any vitamins or minerals until you talk to your nutritionist or surgeon Your nutritionist and/or surgeon must approve all vitamin and mineral supplements   Activity and Exercise: It is important to continue walking at home.  Limit your physical activity as instructed by your doctor.  During this time, use these guidelines: Do not lift anything greater than ten (10) pounds for at least two (2) weeks Do not go back to work or drive until Designer, industrial/product says you can You may have sex when you feel comfortable  It is VERY important for female patients to use a reliable birth control method; fertility often increases after surgery  Do not get pregnant for at least 18 months Start exercising as soon as your doctor tells you that you can Make sure your doctor approves any physical activity Start with a simple walking program Walk 5-15 minutes each day, 7 days per week.  Slowly increase until you are walking 30-45 minutes per day Consider joining our BELT program. [phone] or email  [email or web address]   Special Instructions Things to remember:  Use your CPAP when sleeping if this applies to you, do not stop the use of CPAP unless directed by physician after a sleep study [facility] has a free Bariatric Surgery Support Group that meets monthly, the 3rd Thursday, 6 pm.  Please review discharge information for date and location of this meeting. It is very important to keep all follow up appointments with your surgeon, nutritionist, primary care physician, and behavioral health practitioner After the first year, please follow up with your bariatric surgeon and nutritionist at least once a year in order to maintain best weight loss results   [facility]: [phone] [facility]: [phone] Bariatric Nurse Coordinator: [phone]

## 2024-03-17 ENCOUNTER — Telehealth (HOSPITAL_COMMUNITY): Payer: Self-pay

## 2024-03-17 NOTE — Telephone Encounter (Signed)
 1. Tell me about your pain and pain management?    Pt denies any pain.   2. Let's talk about fluid intake. How much total fluid are you taking in?   Pt states that s/he is getting in at least _64_+_oz of fluid including protein shakes, bottled water ,   3. How much protein have you taken in the last day?     Pt states she is meeting the goal of 90g of protein each day with the protein shakes.  We discussed adding low fat cream soups and greek yogurt to help with hunger.  Encouraged to call RD if there are additional questions    4. Have you had nausea? Tell me about when you have experienced nausea and what you did to help?   Pt denies nausea.   5. Has the frequency or color changed with your urine?   Pt states that s/he is urinating fine with no changes in frequency or urgency.   6. Tell me what your incisions look like?   Incisions look fine. Pt denies a fever, chills. Pt states incisions are not swollen, open, or draining. Pt encouraged to call [facility] if incisions change.   7. Have you been passing gas? BM?   Pt states that they are having BMs everyday. Last BM 03/17/24  Pt states that they have had a BM. Pt instructed to take either Miralax or MoM as instructed per Gastric Bypass/Sleeve Discharge Home Care Instructions. Pt to call surgeon's office if not able to have BM with medication.      8. If a problem or question were to arise who would you call? Do you know contact numbers for [facility], [facility], and [facility]?   Pt knows to call [facility] for surgical, [facility] for nutrition, and [facility] for non-urgent questions or concerns. Pt denies dehydration symptoms. Pt can describe s/sx of dehydration.   9. How has the walking going?   Pt states s/he is walking around and able to be active without difficulty. Stated already been to church, walking 10 minutes on tread mill  10. Are you still using your incentive spirometer? If so, how often?   Pt states that s/he is doing the I.S. Pt encouraged to  use incentive spirometer, at least 10x every hour while awake until s/he sees the surgeon.   11. How are your vitamins and calcium  going? How are you taking them?     Pt states that s/he is taking his/her supplements and vitamins without difficulty.    Reminded patient that the first 30 days post-operatively are important for successful recovery. Practice good hand hygiene, and minimizing exposure to people who live outside of the home, especially if they are exhibiting any respiratory, GI, or illness-like symptoms.

## 2024-03-18 DIAGNOSIS — E1165 Type 2 diabetes mellitus with hyperglycemia: Secondary | ICD-10-CM | POA: Diagnosis not present

## 2024-03-18 DIAGNOSIS — Z9889 Other specified postprocedural states: Secondary | ICD-10-CM | POA: Diagnosis not present

## 2024-03-18 DIAGNOSIS — E236 Other disorders of pituitary gland: Secondary | ICD-10-CM | POA: Diagnosis not present

## 2024-03-18 DIAGNOSIS — I5022 Chronic systolic (congestive) heart failure: Secondary | ICD-10-CM | POA: Diagnosis not present

## 2024-03-18 DIAGNOSIS — Z87898 Personal history of other specified conditions: Secondary | ICD-10-CM | POA: Diagnosis not present

## 2024-03-18 DIAGNOSIS — E041 Nontoxic single thyroid nodule: Secondary | ICD-10-CM | POA: Diagnosis not present

## 2024-03-18 DIAGNOSIS — Z9009 Acquired absence of other part of head and neck: Secondary | ICD-10-CM | POA: Diagnosis not present

## 2024-03-18 DIAGNOSIS — Z794 Long term (current) use of insulin: Secondary | ICD-10-CM | POA: Diagnosis not present

## 2024-03-24 DIAGNOSIS — E669 Obesity, unspecified: Secondary | ICD-10-CM | POA: Diagnosis not present

## 2024-03-24 DIAGNOSIS — Z6834 Body mass index (BMI) 34.0-34.9, adult: Secondary | ICD-10-CM | POA: Diagnosis not present

## 2024-03-24 DIAGNOSIS — E1165 Type 2 diabetes mellitus with hyperglycemia: Secondary | ICD-10-CM | POA: Diagnosis not present

## 2024-03-24 DIAGNOSIS — Z09 Encounter for follow-up examination after completed treatment for conditions other than malignant neoplasm: Secondary | ICD-10-CM | POA: Diagnosis not present

## 2024-03-24 DIAGNOSIS — G4733 Obstructive sleep apnea (adult) (pediatric): Secondary | ICD-10-CM | POA: Diagnosis not present

## 2024-03-24 DIAGNOSIS — Z9884 Bariatric surgery status: Secondary | ICD-10-CM | POA: Diagnosis not present

## 2024-03-24 DIAGNOSIS — R899 Unspecified abnormal finding in specimens from other organs, systems and tissues: Secondary | ICD-10-CM | POA: Diagnosis not present

## 2024-03-25 ENCOUNTER — Encounter: Attending: General Surgery | Admitting: Dietician

## 2024-03-25 ENCOUNTER — Encounter: Payer: Self-pay | Admitting: Dietician

## 2024-03-25 VITALS — Ht 71.5 in | Wt 246.3 lb

## 2024-03-25 DIAGNOSIS — Z713 Dietary counseling and surveillance: Secondary | ICD-10-CM | POA: Diagnosis not present

## 2024-03-25 DIAGNOSIS — Z6833 Body mass index (BMI) 33.0-33.9, adult: Secondary | ICD-10-CM | POA: Diagnosis not present

## 2024-03-25 DIAGNOSIS — E669 Obesity, unspecified: Secondary | ICD-10-CM | POA: Diagnosis present

## 2024-03-25 NOTE — Progress Notes (Signed)
 2 Week Post-Operative Nutrition Class   Class start Time: 1515   Class End Time: 1615  This was a class of 3 patients.   Patient was seen on 03/25/2024 for Post-Operative Nutrition education at the [facility].    Surgery date: 03/11/2024 Surgery type: Gastric By-Pass  Anthropometrics  Start weight at [facility]: 262.3 lbs (date: 03/26/2023) Height: 71.5 in Weight today: 246.3 lb    Clinical   Pharmacotherapy: History of weight loss medication used: Mounjaro , Ozempic  (not tolerated)  Medical hx: cancer, T2DM, HTN, asthma, obesity, sleep apnea, heart attack Medications: Praluent , entresol, metoprolol  succinate, furosemide , venlafaxine  XR, Lantuss, alprazolam , Advair , Mounjaro  Labs: A1c 9.2 Notable signs/symptoms: none noted Any previous deficiencies? No Bowel Habits: Every day to every other day no complaints   Body Composition Scale 03/25/2024  Current Body Weight 246.3  Total Body Fat % 42.8  Visceral Fat 14  Fat-Free Mass % 57.1   Total Body Water  % 43.0  Muscle-Mass lbs 34.3  BMI 33.8  Body Fat Displacement          Torso  lbs 65.4         Left Leg  lbs 13.0         Right Leg  lbs 13.0         Left Arm  lbs 6.5         Right Arm  lbs 6.5    The following the learning objectives were met by the patient during this course: Identifies Soft Prepped Plan Advancement Guide  Identifies Soft, High Proteins (Phase 1), beginning 2 weeks post-operatively to 3 weeks post-operatively Identifies Additional Soft High Proteins, soft non-starchy vegetables, fruits and starches (Phase 2), beginning 3 weeks post-operatively to 3 months post-operatively Identifies appropriate sources of fluids, proteins, vegetables, fruits and starches Identifies appropriate fat sources and healthy verses unhealthy fat types   States protein, vegetable, fruit and starch recommendations and appropriate sources post-operatively Identifies the need for appropriate texture  modifications, mastication, and bite sizes when consuming solids Identifies appropriate fat consumption and sources Identifies appropriate multivitamin and calcium  sources post-operatively Describes the need for physical activity post-operatively and will follow MD recommendations States when to call healthcare provider regarding medication questions or post-operative complications   Handouts given during class include: Soft Prepped Plan Advancement Guide   Follow-Up Plan: Patient will follow-up at [facility] in 10 weeks for 3 month post-op nutrition visit for diet advancement per MD.

## 2024-03-25 NOTE — Discharge Summary (Signed)
 Physician Discharge Summary  Destiny Beasley FMW:[id number] DOB: 06-Jun-1955 DOA: 03/11/2024  PCP: Okey Carlin Redbird, MD  Admit date: 03/11/2024 Discharge date: 03/12/2024   Recommendations for Outpatient Follow-up:  none (include homehealth, outpatient follow-up instructions, specific recommendations for PCP to follow-up on, etc.)   Follow-up Information     Laria Grimmett, Herlene Righter, MD. Go on 04/02/2024.   Specialty: General Surgery Why: @ 9 am. Please arrive 15 minutes prior to appointment. Thank you Contact information: [address] [place] [address] [phone]         Maczis, Tonja [place], PA-C. Go on 05/09/2024.   Specialty: General Surgery Why: @ 945 am. Please arrive 15 minutes prior to appointment.  Thank you Contact information: [address] [facility] [place] [address] [phone]                Discharge Diagnoses:  Principal Problem:   Class II obesity   Surgical Procedure: Roux-en-Y gastric bypass, upper endoscopy  Discharge Condition: Good Disposition: Home  Diet recommendation: Postoperative sleeve gastrectomy diet (liquids only)  Filed Weights   03/11/24 0549 03/11/24 0602  Weight: 118.3 kg 116.1 kg     Hospital Course:  The patient was admitted after undergoing Roux-en-Y gastric bypass. POD 0 she ambulated well. POD 1 she was started on the water  diet protocol and tolerated 300 ml in the first shift. Once meeting the water  amount she was advanced to bariatric protein shakes which they tolerated and were discharged home POD 1.  Treatments: surgery: Roux-en-Y gastric bypass  Discharge Instructions  Discharge Instructions     Ambulate hourly while awake   Complete by: As directed    Call MD for:  difficulty breathing, headache or visual disturbances   Complete by: As directed    Call MD for:  persistant dizziness or light-headedness   Complete by: As directed    Call MD for:   persistant nausea and vomiting   Complete by: As directed    Call MD for:  redness, tenderness, or signs of infection (pain, swelling, redness, odor or green/yellow discharge around incision site)   Complete by: As directed    Call MD for:  severe uncontrolled pain   Complete by: As directed    Call MD for:  temperature >101 F   Complete by: As directed    Diet bariatric full liquid   Complete by: As directed    Discharge wound care:   Complete by: As directed    Remove Bandaids tomorrow, ok to shower tomorrow. Steristrips may fall off in 1-3 weeks.   Incentive spirometry   Complete by: As directed    Perform hourly while awake      Allergies as of 03/12/2024       Reactions   Hydrocodone  Other (See Comments)   Severe anxiety Confusion   Invokana  [canagliflozin ] Other (See Comments)   Constant yeast infections   Cardizem [diltiazem Hcl] Other (See Comments)   muscle weakness   Cardura  [doxazosin ]    LEG CRAMPS    Chlorthalidone  Other (See Comments)   Leg pain   Coreg  [carvedilol ] Other (See Comments)   Wheezing with 25mg  BID dosing NOT ALLERGIC 10/08/20   Crestor  [rosuvastatin ] Other (See Comments)   myalgias   Hydralazine  Other (See Comments)   Fatigue   Imdur  [isosorbide  Nitrate] Other (See Comments)   Patient reported wheezing   Metformin  Hcl Nausea Only   Norvasc  [amlodipine ] Other (See Comments)   Muscles  hurt   Repatha  [evolocumab ] Other (See Comments)   Injection site pain   Semaglutide  Other (See Comments)   wheezing   Spironolactone  Other (See Comments)   Wheezing vs fatigue?   Tirzepatide  Other (See Comments)   stomach burning   Valsartan  Dermatitis   Zocor [simvastatin] Other (See Comments)   myalgias   Atorvastatin  Other (See Comments)   DIZZINESS   Compazine  [prochlorperazine ] Anxiety        Medication List     STOP taking these medications    furosemide  40 MG tablet Commonly known as: LASIX    Lantus  SoloStar 100 UNIT/ML Solostar  Pen Generic drug: insulin  glargine       TAKE these medications    acetaminophen  500 MG tablet Commonly known as: TYLENOL  Take 1,000 mg by mouth every 6 (six) hours as needed for moderate pain (pain score 4-6).   Advair  HFA 115-21 MCG/ACT inhaler Generic drug: fluticasone -salmeterol Inhale 2 puffs into the lungs in the morning and at bedtime.   albuterol  108 (90 Base) MCG/ACT inhaler Commonly known as: VENTOLIN  HFA Inhale 1-2 puffs into the lungs every 6 (six) hours as needed for wheezing or shortness of breath.   ALPRAZolam  0.25 MG tablet Commonly known as: XANAX  Take 0.25 mg by mouth 2 (two) times daily as needed for anxiety.   Blood Glucose Monitoring Suppl Devi 1 each by Does not apply route as directed. Dispense based on patient and insurance preference. Use up to four times daily as directed. (FOR ICD-10 E10.9, E11.9).   diclofenac Sodium 1 % Gel Commonly known as: VOLTAREN Apply 4 g topically 4 (four) times daily.   FreeStyle Libre 3 Sensor Misc APPLY 1 SENSOR TO UPPER ARM  EVERY 14 DAYS FOR CONTINUOUS  GLUCOSE MONITORING   FREESTYLE TEST STRIPS test strip Generic drug: glucose blood Use as instructed What changed: Another medication with the same name was added. Make sure you understand how and when to take each.   BLOOD GLUCOSE TEST STRIPS Strp 1 each by Does not apply route as directed. Dispense based on patient and insurance preference. Use up to four times daily as directed. (FOR ICD-10 E10.9, E11.9). What changed: You were already taking a medication with the same name, and this prescription was added. Make sure you understand how and when to take each.   insulin  lispro 100 UNIT/ML KwikPen Commonly known as: HUMALOG  Inject 0-6 Units into the skin 3 (three) times daily with meals. Check Blood Glucose (BG) and inject per scale: BG <150= 0 unit; BG 150-200= 1 unit; BG 201-250= 2 unit; BG 251-300= 3 unit; BG 301-350= 4 unit; BG 351-400= 5 unit; BG >400= 6 unit  and Call Primary Care. What changed:  how much to take when to take this additional instructions   Lancet Device Misc 1 each by Does not apply route as directed. Dispense based on patient and insurance preference. Use up to four times daily as directed. (FOR ICD-10 E10.9, E11.9).   Lancets Misc 1 each by Does not apply route as directed. Dispense based on patient and insurance preference. Use up to four times daily as directed. (FOR ICD-10 E10.9, E11.9).   metoprolol  succinate 25 MG 24 hr tablet Commonly known as: TOPROL -XL Take 1 tablet (25 mg total) by mouth daily. Take with or immediately following a meal. Notes to patient: Monitor Blood Pressure Daily and keep a log for primary care physician.  You may need to make changes to your medications with rapid weight loss.  NIFEdipine  30 MG 24 hr tablet Commonly known as: PROCARDIA -XL/NIFEDICAL-XL Take 1 tablet (30 mg total) by mouth every evening. Notes to patient: Monitor Blood Pressure Daily and keep a log for primary care physician.  You may need to make changes to your medications with rapid weight loss.      nitroGLYCERIN  0.4 MG SL tablet Commonly known as: NITROSTAT  Place 1 tablet (0.4 mg total) under the tongue every 5 (five) minutes as needed for chest pain.   ondansetron  4 MG disintegrating tablet Commonly known as: ZOFRAN -ODT Take 1 tablet (4 mg total) by mouth every 6 (six) hours as needed for nausea or vomiting.   pantoprazole  40 MG tablet Commonly known as: PROTONIX  Take 1 tablet (40 mg total) by mouth daily.   Pen Needles 31G X 5 MM Misc 1 each by Does not apply route as directed. Dispense based on patient and insurance preference. Use up to four times daily as directed. (FOR ICD-10 E10.9, E11.9).   Praluent  75 MG/ML Soaj Generic drug: Alirocumab  ADMINISTER 1 ML (75 MG) UNDER THE SKIN EVERY 14 DAYS   sacubitril -valsartan  49-51 MG Commonly known as: ENTRESTO  TAKE 1 TABLET BY MOUTH TWICE DAILY Notes to  patient: Monitor Blood Pressure Daily and keep a log for primary care physician.  You may need to make changes to your medications with rapid weight loss.      valACYclovir  1000 MG tablet Commonly known as: VALTREX  Take 1,000 mg by mouth 2 (two) times daily as needed (fever blisters).   venlafaxine  XR 75 MG 24 hr capsule Commonly known as: EFFEXOR -XR Take 75 mg by mouth daily with breakfast.   Vitamin D  (Ergocalciferol ) 1.25 MG (50000 UNIT) Caps capsule Commonly known as: DRISDOL  Take 1 capsule (50,000 Units total) by mouth every Wednesday.               Discharge Care Instructions  (From admission, onward)           Start     Ordered   03/12/24 0000  Discharge wound care:       Comments: Remove Bandaids tomorrow, ok to shower tomorrow. Steristrips may fall off in 1-3 weeks.   03/12/24 1415            Follow-up Information     Symphoni Helbling, Herlene Righter, MD. Go on 04/02/2024.   Specialty: General Surgery Why: @ 9 am. Please arrive 15 minutes prior to appointment. Thank you Contact information: [address] [place] [address] [phone]         Maczis, Tonja Hunker, PA-C. Go on 05/09/2024.   Specialty: General Surgery Why: @ 945 am. Please arrive 15 minutes prior to appointment.  Thank you Contact information: [address] [facility] [place] [address] [phone]                  The results of significant diagnostics from this hospitalization (including imaging, microbiology, ancillary and laboratory) are listed below for reference.     Principal Problem:   Class II obesity   VTE plan: no chemical prophylaxis recommended ([email or web address])  Time coordinating discharge: 15 min

## 2024-04-03 ENCOUNTER — Telehealth: Payer: Self-pay | Admitting: Dietician

## 2024-04-03 NOTE — Telephone Encounter (Signed)
 RD called pt to verify fluid intake once starting soft, solid proteins 2 week post-bariatric surgery.   Daily Fluid intake:  Daily Protein intake:  Bowel Habits:   Concerns/issues:   Left Voice Message, with call back number

## 2024-04-06 ENCOUNTER — Encounter (HOSPITAL_BASED_OUTPATIENT_CLINIC_OR_DEPARTMENT_OTHER): Payer: Self-pay

## 2024-04-07 ENCOUNTER — Telehealth: Payer: Self-pay | Admitting: Dietician

## 2024-04-07 NOTE — Telephone Encounter (Signed)
 Returned patients call. Pt states some times she doesn't feel like eating or drinking anything. Pt states she is really trying to get to her fluid goals, stating she got over 64 oz. Dietitian emphasized eating something even when not hungry, aiming for a protein and some complex carbohydrate. Pt is agreeable. Pt takes insulin  and has a CGM. Pt states she is doing good, other than not having the urge to eat. Patient's questions were answered to her satisfaction, and patient expressed understanding.

## 2024-04-14 ENCOUNTER — Other Ambulatory Visit (HOSPITAL_BASED_OUTPATIENT_CLINIC_OR_DEPARTMENT_OTHER): Payer: Self-pay | Admitting: Family

## 2024-04-14 DIAGNOSIS — I25118 Atherosclerotic heart disease of native coronary artery with other forms of angina pectoris: Secondary | ICD-10-CM

## 2024-04-21 ENCOUNTER — Encounter: Payer: Self-pay | Admitting: Skilled Nursing Facility1

## 2024-04-21 ENCOUNTER — Telehealth: Payer: Self-pay | Admitting: Skilled Nursing Facility1

## 2024-04-21 NOTE — Telephone Encounter (Signed)
 Called pt due to her concerns via email complaints of food getting stuck and having gas so she took some baking soda.   Pt states she thought she was supposed to wait 15 minutes in between each sip of fluid: Dietitian cleared this misunderstanding with pt and had her repeat back how she was going to reach her fluid goal by not limiting herself unnecessarily   Advice given: Ensure meats are tender and able to be forked Limit fluid to sips at a time still ensuring you hit 64 ounces by the end of the day Increase your broth and hot tea to reach the 64 goal Even though you do not want to eat or drink you must meet your goals DAILY

## 2024-04-25 ENCOUNTER — Encounter (HOSPITAL_BASED_OUTPATIENT_CLINIC_OR_DEPARTMENT_OTHER): Payer: Self-pay

## 2024-04-28 ENCOUNTER — Other Ambulatory Visit (HOSPITAL_COMMUNITY): Payer: Self-pay

## 2024-04-28 ENCOUNTER — Telehealth: Payer: Self-pay | Admitting: Pharmacy Technician

## 2024-04-28 NOTE — Telephone Encounter (Signed)
 Needs addressed by office visit. Dr. Raford has opening tomorrow at 8:15 am in [facility], can offer to Destiny Beasley. I have held the slot for her (if she does not use it, please remove hold).   TY!  Abrahan Fulmore S Franck Vinal, NP

## 2024-04-28 NOTE — Telephone Encounter (Signed)
 Pharmacy Patient Advocate Encounter   Received notification from Physician's Office- melinda p that prior authorization for praluent  is required/requested.   Insurance verification completed.   The patient is insured through [employer].   Per test claim: PA required; PA submitted to above mentioned insurance via Latent Key/confirmation #/EOC [id number] Status is pending

## 2024-04-28 NOTE — Progress Notes (Signed)
 "  Advanced Hypertension Clinic Initial Assessment:    Date:  04/29/2024   ID:  Destiny Beasley, DOB Feb 09, 1956, MRN [id number]  PCP:  Okey Carlin Redbird, MD  Cardiologist:  Ozell Fell, MD  Nephrologist:  Referring MD: Okey Carlin Redbird, MD   CC: Hypertension  History of Present Illness:    Destiny Beasley is a 69 y.o. female with hypertension, inferolateral STEMI, complete heart block, CAD, chronic systolic heart failure, stroke (03/2016), meningioma, pituitary tumor, right breast cancer s/p radiation therapy, hyperlipidemia, diabetes, anemia, asthma, and sleep apnea, here for follow up. She has a history of inferior STEMI 06/2017 complicated by cardiogenic shock/complete heart block. She had an emergent cath showing culprit large dominant LCx. Subsequently underwent aspiration thrombectomy and DES. Her LVEF was 40-45% and has remained unchanged. She has struggled with blood pressure control. Entresto  was increased 04/2021, but reduced due to muscle pain. She saw Reche Finder, NP 07/2021 and was started on doxazosin . She had a repeat Echo that revealed LVEF 45-50%, inferolateral hypokinesis, and grade 1 diastolic dysfunction.    She  developed hypertension in her 30's, always difficult to control. At her initial visit 06/2021 metoprolol  was switched to bisoprolol  and referred to the PREP program. She stopped bisoprolol  2/2 muscle pain.   She was seen 07/2021 and started on doxazosin . She didn't tolerate hydralazine  or minoxidil .  On 08/2022 Entresto  was increased and subsequently reduced to 49/51mg  due to side effects.   Renal Dopplers were negative for RAS 11/2022.  She uses BiPAP intermittently.  She was seen preoperatively for weight loss surgery 10/2023 and BP remained uncontrolled.   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   Previous medication intolerances: Amlodipine  (muscle pain) Coreg  (wheeze with 25mg  dose) Chlorthalidone  (leg pain) Spironolactone  (wheeze/fatigue) Jardiance   (hallucinations, yeast infections) Entresto  (intolerant to 97-103mg  dose with cramps, tolerates 49-51mg  dose) Hydralazine  (fatigue) Imdur  (wheeze) Doxazosin  (leg cramps) Metoprolol  nifedipine    Discussed the use of AI scribe software for clinical note transcription with the patient, who gave verbal consent to proceed.  History of Present Illness [name] has been experiencing significantly elevated blood pressure readings, with a recent measurement of 200/100 mmHg, prompting a visit to the hospital from church. Her blood pressure has been higher than ever before, with previous readings around 145/70-80 mmHg prior to her bypass surgery.  No changes were made to her blood pressure medications before or after her bypass surgery, except for being taken off Lantus  and a fluid pill, furosemide  40 mg. Currently, she is on amlodipine , Entresto , and metoprolol . She reports that furosemide  was stopped, and she is consuming water , fluid, and protein drinks.  She is concerned about excessive vitamin D  intake from calcium  supplements and multivitamins, which she believes might be contributing to her elevated blood pressure. No swelling or issues with breathing. Her diet is low in salt and caffeine.  She is physically active, substituting at a pre-K school, and feels okay while walking. She isn't getting formal exercise.  Recent lab results show good cholesterol levels, with HDL at 66 and LDL at 69. Her renin levels were low, but other blood work was satisfactory.  She has a history of wheezing with nifedipine , which she stopped taking soon after it was prescribed. She has been on her current medications for about a year, but her blood pressure remains high.  Previous antihypertensives:    Past Medical History:  Diagnosis Date   Anemia    Anxiety    CAD in native artery  a. Inf STEMI 06/2017 complicated by cardiogenic shock/complete heart block s/p emergent cath showing culprit large dominant LCx  s/p aspiration thrombectomy and DES, EF 50% by cath, 40-45% by echo.   Chronic systolic heart failure (HCC) 06/25/2017   Echo 11/19: mild LVH, EF 40-45, inf-lat HK, Gr 1 DD, trivial MR   Complete heart block, transient (HCC)    a. 06/2017 in setting of acute inf MI -> resolved after PCI.   COVID-19    Diabetes mellitus    Goiter 2005   History of radiation therapy 06/14/2016 - 07/19/2016   Right Breast 50 Gy 25 fractions   Hypercholesteremia    Hypertension    Ischemic cardiomyopathy    a. EF 50% by cath and 40-45% by echo 06/2017.   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   Meningioma [facility])    Nuclear stress test    Nuclear stress test 10/19: EF 35, inferolateral, apical scar; inferior, apical inferior infarct with mild peri-infarct ischemia; high risk   Obesity    Personal history of chemotherapy    Personal history of radiation therapy    Pituitary tumor    Seasonal asthma    Sleep apnea    does not use every night   Stroke [facility])    TIA - Dec 1 st, 2017    Past Surgical History:  Procedure Laterality Date   ABDOMINAL HYSTERECTOMY     BRAIN MENINGIOMA EXCISION  2005   BREAST BIOPSY     BREAST LUMPECTOMY Right    2017   BREAST LUMPECTOMY WITH RADIOACTIVE SEED LOCALIZATION Right 04/20/2016   Procedure: RIGHT BREAST LUMPECTOMY WITH RADIOACTIVE SEED LOCALIZATION;  Surgeon: Elon Pacini, MD;  Location: [facility];  Service: General;  Laterality: Right;   BREAST REDUCTION SURGERY Bilateral 10/23/2016   Procedure: BILATERAL BREAST REDUCTION WITH LIPOSUCTION ASSISTANCE;  Surgeon: Marcus Lung, MD;  Location: [facility];  Service: Plastics;  Laterality: Bilateral;   CESAREAN SECTION     CORONARY STENT INTERVENTION N/A 06/25/2017   Procedure: CORONARY STENT INTERVENTION;  Surgeon: Wonda Sharper, MD;  Location: [facility] INVASIVE CV LAB;  Service: Cardiovascular;   Laterality: N/A;   CORONARY/GRAFT ACUTE MI REVASCULARIZATION N/A 06/25/2017   Procedure: Coronary/Graft Acute MI Revascularization;  Surgeon: Wonda Sharper, MD;  Location: [facility] INVASIVE CV LAB;  Service: Cardiovascular;  Laterality: N/A;   FOOT SURGERY Bilateral 2016   hammer toe surgery   LAPAROSCOPIC ROUX-EN-Y GASTRIC BYPASS WITH HIATAL HERNIA REPAIR N/A 03/11/2024   Procedure: CREATION, GASTRIC BYPASS, LAPAROSCOPIC, USING ROUX-EN-Y GASTROENTEROSTOMY, WITH HIATAL HERNIA REPAIR;  Surgeon: Kinsinger, Herlene Righter, MD;  Location: [facility];  Service: General;  Laterality: N/A;   LEFT HEART CATH AND CORONARY ANGIOGRAPHY N/A 06/25/2017   Procedure: LEFT HEART CATH AND CORONARY ANGIOGRAPHY;  Surgeon: Wonda Sharper, MD;  Location: [facility] INVASIVE CV LAB;  Service: Cardiovascular;  Laterality: N/A;   LEFT HEART CATH AND CORONARY ANGIOGRAPHY N/A 05/13/2020   Procedure: LEFT HEART CATH AND CORONARY ANGIOGRAPHY;  Surgeon: Court Dorn PARAS, MD;  Location: [facility] INVASIVE CV LAB;  Service: Cardiovascular;  Laterality: N/A;   MENISCUS REPAIR Left 2015   PITUITARY SURGERY     REDUCTION MAMMAPLASTY     RIGHT HEART CATH N/A 06/25/2017   Procedure: RIGHT HEART CATH;  Surgeon: Wonda Sharper, MD;  Location: [facility] INVASIVE CV LAB;  Service: Cardiovascular;  Laterality: N/A;   THYROID  SURGERY  UPPER GI ENDOSCOPY N/A 03/11/2024   Procedure: ENDOSCOPY, UPPER GI TRACT;  Surgeon: Stevie, Herlene Righter, MD;  Location: [facility];  Service: General;  Laterality: N/A;    Current Medications: Active Medications[1]   Allergies:   Hydrocodone , Invokana  [canagliflozin ], Cardizem [diltiazem hcl], Cardura  [doxazosin ], Chlorthalidone , Coreg  [carvedilol ], Crestor  [rosuvastatin ], Hydralazine , Imdur  [isosorbide  nitrate], Metformin  hcl, Nifedipine , Norvasc  [amlodipine ], Repatha  [evolocumab ], Semaglutide , Spironolactone , Tirzepatide , Valsartan , Zocor [simvastatin], Atorvastatin , and Compazine  [prochlorperazine ]   Social History   Socioeconomic  History   Marital status: Single    Spouse name: Not on file   Number of children: Not on file   Years of education: Not on file   Highest education level: Bachelor's degree (e.g., BA, AB, BS)  Occupational History   Occupation: full time  Tobacco Use   Smoking status: Never   Smokeless tobacco: Never  Vaping Use   Vaping status: Never Used  Substance and Sexual Activity   Alcohol use: Yes    Comment: occ   Drug use: No   Sexual activity: Not on file    Comment: no cycles; pt had hyst  Other Topics Concern   Not on file  Social History Narrative   Lives alone   Right Handed   Drinks 1-2 cups caffeine daily   Social Drivers of Health   Tobacco Use: Low Risk (04/29/2024)   Patient History    Smoking Tobacco Use: Never    Smokeless Tobacco Use: Never    Passive Exposure: Not on file  Financial Resource Strain: Low Risk (10/04/2021)   Overall Financial Resource Strain (CARDIA)    Difficulty of Paying Living Expenses: Not hard at all  Food Insecurity: No Food Insecurity (03/11/2024)   Epic    Worried About Programme Researcher, Broadcasting/film/video in the Last Year: Never true    Ran Out of Food in the Last Year: Never true  Transportation Needs: No Transportation Needs (03/11/2024)   Epic    Lack of Transportation (Medical): No    Lack of Transportation (Non-Medical): No  Physical Activity: Inactive (10/04/2021)   Exercise Vital Sign    Days of Exercise per Week: 0 days    Minutes of Exercise per Session: 0 min  Stress: Not on file  Social Connections: Moderately Integrated (03/11/2024)   Social Connection and Isolation Panel    Frequency of Communication with Friends and Family: More than three times a week    Frequency of Social Gatherings with Friends and Family: More than three times a week    Attends Religious Services: More than 4 times per year    Active Member of Clubs or Organizations: Yes    Attends Banker Meetings: More than 4 times per year    Marital Status:  Divorced  Depression (PHQ2-9): Low Risk (03/26/2023)   Depression (PHQ2-9)    PHQ-2 Score: 0  Alcohol Screen: Low Risk (10/04/2021)   Alcohol Screen    Last Alcohol Screening Score (AUDIT): 2  Housing: Low Risk (03/11/2024)   Epic    Unable to Pay for Housing in the Last Year: No    Number of Times Moved in the Last Year: 0    Homeless in the Last Year: No  Utilities: Not At Risk (03/11/2024)   Epic    Threatened with loss of utilities: No  Health Literacy: Not on file     Family History: The patient's family history includes Alcohol abuse in her brother; Diabetes in her mother; Heart attack in her mother; Hyperlipidemia in her mother;  Hypertension in her mother and sister; Stroke in her maternal uncle.  ROS:   Please see the history of present illness.     All other systems reviewed and are negative.  EKGs/Labs/Other Studies Reviewed:    EKG:  EKG is not ordered today.    Recent Labs: 02/29/2024: ALT 34 03/02/2024: BUN 15; Potassium 3.9; Sodium 140 03/11/2024: Creatinine, Ser 0.85 03/12/2024: Hemoglobin 12.4; Platelets 264   Recent Lipid Panel    Component Value Date/Time   CHOL 155 06/28/2023 1258   TRIG 89 06/28/2023 1258   HDL 61 06/28/2023 1258   CHOLHDL 2.5 06/28/2023 1258   CHOLHDL 2 08/12/2018 0944   VLDL 19.2 08/12/2018 0944   LDLCALC 77 06/28/2023 1258   LDLDIRECT 67 07/15/2020 1609   LDLDIRECT 87.0 06/04/2019 0835    Physical Exam:   VS:  BP (!) 174/84   Pulse (!) 106   Ht 5' 11.5 (1.816 m)   Wt 225 lb 6.4 oz (102.2 kg)   SpO2 98%   BMI 31.00 kg/m  , BMI Body mass index is 31 kg/m. GENERAL:  Well appearing HEENT: Pupils equal round and reactive, fundi not visualized, oral mucosa unremarkable NECK:  No jugular venous distention, waveform within normal limits, carotid upstroke brisk and symmetric, no bruits, no thyromegaly LUNGS:  Clear to auscultation bilaterally HEART:  RRR.  PMI not displaced or sustained,S1 and S2 within normal limits, no S3, no  S4, no clicks, no rubs, no murmurs ABD:  Flat, positive bowel sounds normal in frequency in pitch, no bruits, no rebound, no guarding, no midline pulsatile mass, no hepatomegaly, no splenomegaly EXT:  2 plus pulses throughout, no edema, no cyanosis no clubbing SKIN:  No rashes no nodules NEURO:  Cranial nerves II through XII grossly intact, motor grossly intact throughout PSYCH:  Cognitively intact, oriented to person place and time   ASSESSMENT/PLAN:    Assessment & Plan # Primary hypertension Blood pressure remains elevated post-bypass surgery despite current medications. She has many medication intolerances as listed above. Renal denervation considered due to limited medication options.  Secondary causes have been ruled out.  Renin and aldosterone levels were markable and renal Dopplers were normal. - Initiated indapamide  1.25 mg daily. - Checked BMP in one week to monitor potassium levels. - [employer] approval process for renal denervation procedure. - Scheduled follow-up appointment in six weeks.  # HFmrEF:  LVEF 45-50%.  Managed with Entresto  and metoprolol . No recent exacerbations or symptoms of fluid overload.  She is not able to tolerate higher doses of Entresto  or other GDMT.  Several other beta-blockers have been tried and not tolerated.  # Hyperlipidemia Cholesterol levels well-controlled with current management.  Continue Praluent .   # Obesity: S/p gastric bypass.    Screening for Secondary Hypertension:     10/04/2021    9:43 AM  Causes  Drugs/Herbals Screened     - Comments limitis sodium and caffeine.  Uses Aleve  bid.  Renovascular HTN Screened     - Comments No renal artery stenosis on CT 05/2021  Sleep Apnea Screened     - Comments uses BiPAP  Thyroid  Disease Screened  Hyperaldosteronism Screened     - Comments No adenomas on CT in 2023  Pheochromocytoma Screened     - Comments No adenomas or symptoms.  Cushing's Syndrome N/A  Hyperparathyroidism  Screened  Coarctation of the Aorta Screened     - Comments BP symmetric  Compliance Screened    Relevant Labs/Studies:  Latest Ref Rng & Units 03/11/2024    1:59 PM 03/02/2024    3:30 PM 02/29/2024   10:49 AM  Basic Labs  Sodium 135 - 145 mmol/L  140  141   Potassium 3.5 - 5.1 mmol/L  3.9  4.2   Creatinine 0.44 - 1.00 mg/dL 9.14  9.26  9.13        Latest Ref Rng & Units 01/18/2021    8:46 AM 11/30/2020    8:22 AM  Thyroid    TSH 0.35 - 5.50 uIU/mL 1.26  3.57              Latest Ref Rng & Units 11/11/2011    1:14 PM  Cortisol  Cortisol  ug/dL 9.6        1/87/7975   10:09 AM  Renovascular   Renal Artery US  Completed Yes        Disposition:    FU with MD/PharmD in 6-8 weeks   Medication Adjustments/Labs and Tests Ordered: Current medicines are reviewed at length with the patient today.  Concerns regarding medicines are outlined above.  Orders Placed This Encounter  Procedures   Basic metabolic panel with GFR   Meds ordered this encounter  Medications   indapamide  (LOZOL ) 1.25 MG tablet    Sig: Take 1 tablet (1.25 mg total) by mouth daily.    Dispense:  90 tablet    Refill:  1    D/C NIFEDIPINE      Signed, Annabella Scarce, MD  04/29/2024 9:39 AM    [facility]     [1]  Current Meds  Medication Sig   acetaminophen  (TYLENOL ) 500 MG tablet Take 1,000 mg by mouth every 6 (six) hours as needed for moderate pain (pain score 4-6).   ADVAIR HFA 115-21 MCG/ACT inhaler Inhale 2 puffs into the lungs in the morning and at bedtime.   albuterol  (VENTOLIN  HFA) 108 (90 Base) MCG/ACT inhaler Inhale 1-2 puffs into the lungs every 6 (six) hours as needed for wheezing or shortness of breath.   ALPRAZolam  (XANAX ) 0.25 MG tablet Take 0.25 mg by mouth 2 (two) times daily as needed for anxiety.   Blood Glucose Monitoring Suppl DEVI 1 each by Does not apply route as directed. Dispense based on patient and insurance preference. Use up to four times daily  as directed. (FOR ICD-10 E10.9, E11.9).   Continuous Blood Gluc Sensor (FREESTYLE LIBRE 3 SENSOR) MISC APPLY 1 SENSOR TO UPPER ARM  EVERY 14 DAYS FOR CONTINUOUS  GLUCOSE MONITORING   Glucose Blood (BLOOD GLUCOSE TEST STRIPS) STRP 1 each by Does not apply route as directed. Dispense based on patient and insurance preference. Use up to four times daily as directed. (FOR ICD-10 E10.9, E11.9).   glucose blood (FREESTYLE TEST STRIPS) test strip Use as instructed   indapamide  (LOZOL ) 1.25 MG tablet Take 1 tablet (1.25 mg total) by mouth daily.   insulin  lispro (HUMALOG ) 100 UNIT/ML KwikPen Inject 0-6 Units into the skin 3 (three) times daily with meals. Check Blood Glucose (BG) and inject per scale: BG <150= 0 unit; BG 150-200= 1 unit; BG 201-250= 2 unit; BG 251-300= 3 unit; BG 301-350= 4 unit; BG 351-400= 5 unit; BG >400= 6 unit and Call Primary Care.   Insulin  Pen Needle (PEN NEEDLES) 31G X 5 MM MISC 1 each by Does not apply route as directed. Dispense based on patient and insurance preference. Use up to four times daily as directed. (FOR ICD-10 E10.9, E11.9).   Lancet Device  MISC 1 each by Does not apply route as directed. Dispense based on patient and insurance preference. Use up to four times daily as directed. (FOR ICD-10 E10.9, E11.9).   Lancets MISC 1 each by Does not apply route as directed. Dispense based on patient and insurance preference. Use up to four times daily as directed. (FOR ICD-10 E10.9, E11.9).   LANTUS  SOLOSTAR 100 UNIT/ML Solostar Pen Inject 30 Units into the skin.   metoprolol  succinate (TOPROL -XL) 25 MG 24 hr tablet Take 1 tablet (25 mg total) by mouth daily. Take with or immediately following a meal.   nitroGLYCERIN  (NITROSTAT ) 0.4 MG SL tablet Place 1 tablet (0.4 mg total) under the tongue every 5 (five) minutes as needed for chest pain.   Oyster Shell Calcium  500 MG TABS 1 tablet with meals Orally 3 times a day   pantoprazole  (PROTONIX ) 40 MG tablet Take 1 tablet (40 mg total) by  mouth daily.   sacubitril -valsartan  (ENTRESTO ) 49-51 MG TAKE 1 TABLET BY MOUTH TWICE DAILY   valACYclovir (VALTREX) 1000 MG tablet Take 1,000 mg by mouth 2 (two) times daily as needed (fever blisters).   venlafaxine  XR (EFFEXOR -XR) 75 MG 24 hr capsule Take 75 mg by mouth daily with breakfast.   [DISCONTINUED] NIFEdipine  (PROCARDIA -XL/NIFEDICAL-XL) 30 MG 24 hr tablet Take 1 tablet (30 mg total) by mouth every evening.   "

## 2024-04-29 ENCOUNTER — Ambulatory Visit (HOSPITAL_BASED_OUTPATIENT_CLINIC_OR_DEPARTMENT_OTHER): Admitting: Cardiovascular Disease

## 2024-04-29 ENCOUNTER — Other Ambulatory Visit (HOSPITAL_COMMUNITY): Payer: Self-pay

## 2024-04-29 ENCOUNTER — Encounter (HOSPITAL_BASED_OUTPATIENT_CLINIC_OR_DEPARTMENT_OTHER): Payer: Self-pay | Admitting: Cardiovascular Disease

## 2024-04-29 VITALS — BP 174/84 | HR 106 | Ht 71.5 in | Wt 225.4 lb

## 2024-04-29 DIAGNOSIS — G4733 Obstructive sleep apnea (adult) (pediatric): Secondary | ICD-10-CM

## 2024-04-29 DIAGNOSIS — E785 Hyperlipidemia, unspecified: Secondary | ICD-10-CM

## 2024-04-29 DIAGNOSIS — I25110 Atherosclerotic heart disease of native coronary artery with unstable angina pectoris: Secondary | ICD-10-CM

## 2024-04-29 DIAGNOSIS — I5022 Chronic systolic (congestive) heart failure: Secondary | ICD-10-CM

## 2024-04-29 DIAGNOSIS — I10 Essential (primary) hypertension: Secondary | ICD-10-CM | POA: Diagnosis not present

## 2024-04-29 DIAGNOSIS — Z5181 Encounter for therapeutic drug level monitoring: Secondary | ICD-10-CM

## 2024-04-29 MED ORDER — INDAPAMIDE 1.25 MG PO TABS: 1.2500 mg | ORAL_TABLET | Freq: Every day | ORAL | 1 refills | Status: AC

## 2024-04-29 NOTE — Patient Instructions (Addendum)
"   Medication Instructions:  START INDAPAMIDE  1.25 MG DAILY   Labwork: BMET IN 1 WEEK   Testing/Procedures: WILL START THE PROCESS FOR RENAL DENERVATION   Follow-Up: 6 WEEKS WITH CAITLIN W NP OR DR Sinai   .   "

## 2024-04-29 NOTE — Telephone Encounter (Signed)
 Patient seen in office today and is aware

## 2024-04-29 NOTE — Telephone Encounter (Signed)
 Pharmacy Patient Advocate Encounter  Received notification from [employer] that Prior Authorization for Praluent  has been APPROVED from 04/28/24 to 04/23/25. Ran test claim, Copay is $470.29- one month Deductible . This test claim was processed through [facility]- copay amounts may vary at other pharmacies due to pharmacy/plan contracts, or as the patient moves through the different stages of their insurance plan.   PA #/Case ID/Reference #: [id number]

## 2024-05-07 LAB — BASIC METABOLIC PANEL WITH GFR
BUN/Creatinine Ratio: 16 (ref 12–28)
BUN: 13 mg/dL (ref 8–27)
CO2: 25 mmol/L (ref 20–29)
Calcium: 9.7 mg/dL (ref 8.7–10.3)
Chloride: 101 mmol/L (ref 96–106)
Creatinine, Ser: 0.81 mg/dL (ref 0.57–1.00)
Glucose: 213 mg/dL — ABNORMAL HIGH (ref 70–99)
Potassium: 4.4 mmol/L (ref 3.5–5.2)
Sodium: 142 mmol/L (ref 134–144)
eGFR: 80 mL/min/1.73

## 2024-05-12 ENCOUNTER — Ambulatory Visit: Payer: Self-pay | Admitting: Cardiovascular Disease

## 2024-05-13 ENCOUNTER — Other Ambulatory Visit: Payer: Self-pay | Admitting: Family

## 2024-05-13 MED ORDER — SACUBITRIL-VALSARTAN 49-51 MG PO TABS: 1.0000 | ORAL_TABLET | Freq: Two times a day (BID) | ORAL | 2 refills | Status: AC

## 2024-05-19 ENCOUNTER — Encounter (HOSPITAL_BASED_OUTPATIENT_CLINIC_OR_DEPARTMENT_OTHER): Admitting: Family

## 2024-05-26 ENCOUNTER — Encounter (HOSPITAL_BASED_OUTPATIENT_CLINIC_OR_DEPARTMENT_OTHER): Payer: Self-pay

## 2024-05-27 ENCOUNTER — Telehealth: Payer: Self-pay | Admitting: Pharmacy Technician

## 2024-05-27 ENCOUNTER — Other Ambulatory Visit (HOSPITAL_COMMUNITY): Payer: Self-pay

## 2024-05-27 NOTE — Telephone Encounter (Signed)
 Can we please do [employer] grant? TY!

## 2024-06-09 ENCOUNTER — Encounter: Admitting: Dietician

## 2024-06-19 ENCOUNTER — Encounter (HOSPITAL_BASED_OUTPATIENT_CLINIC_OR_DEPARTMENT_OTHER): Admitting: Family
